# Patient Record
Sex: Male | Born: 1970
Health system: Southern US, Community
[De-identification: ages and names within clinical notes are randomized; demographics above are authoritative.]

## PROBLEM LIST (undated history)

## (undated) DIAGNOSIS — Z800 Family history of malignant neoplasm of digestive organs: Secondary | ICD-10-CM

## (undated) DIAGNOSIS — C20 Malignant neoplasm of rectum: Secondary | ICD-10-CM

## (undated) DIAGNOSIS — J449 Chronic obstructive pulmonary disease, unspecified: Secondary | ICD-10-CM

## (undated) DIAGNOSIS — Z9189 Other specified personal risk factors, not elsewhere classified: Secondary | ICD-10-CM

## (undated) DIAGNOSIS — K624 Stenosis of anus and rectum: Secondary | ICD-10-CM

## (undated) DIAGNOSIS — E119 Type 2 diabetes mellitus without complications: Secondary | ICD-10-CM

## (undated) DIAGNOSIS — A0472 Enterocolitis due to Clostridium difficile, not specified as recurrent: Secondary | ICD-10-CM

## (undated) DIAGNOSIS — Z1509 Genetic susceptibility to other malignant neoplasm: Secondary | ICD-10-CM

## (undated) DIAGNOSIS — R0600 Dyspnea, unspecified: Secondary | ICD-10-CM

## (undated) DIAGNOSIS — Z932 Ileostomy status: Secondary | ICD-10-CM

## (undated) DIAGNOSIS — F419 Anxiety disorder, unspecified: Secondary | ICD-10-CM

## (undated) DIAGNOSIS — F329 Major depressive disorder, single episode, unspecified: Secondary | ICD-10-CM

## (undated) DIAGNOSIS — C189 Malignant neoplasm of colon, unspecified: Secondary | ICD-10-CM

## (undated) DIAGNOSIS — F32A Depression, unspecified: Secondary | ICD-10-CM

## (undated) HISTORY — DX: Chronic obstructive pulmonary disease, unspecified: J44.9

## (undated) HISTORY — PX: TOE AMPUTATION: SHX809

## (undated) HISTORY — PX: COLON SURGERY: SHX602

## (undated) HISTORY — DX: Family history of malignant neoplasm of digestive organs: Z80.0

---

## 2002-06-28 ENCOUNTER — Encounter: Payer: Self-pay | Admitting: Emergency Medicine

## 2002-06-28 ENCOUNTER — Emergency Department (HOSPITAL_COMMUNITY): Admission: EM | Admit: 2002-06-28 | Discharge: 2002-06-28 | Payer: Self-pay | Admitting: Emergency Medicine

## 2006-09-30 ENCOUNTER — Emergency Department (HOSPITAL_COMMUNITY): Admission: EM | Admit: 2006-09-30 | Discharge: 2006-09-30 | Payer: Self-pay | Admitting: Emergency Medicine

## 2007-02-15 ENCOUNTER — Inpatient Hospital Stay (HOSPITAL_COMMUNITY): Admission: EM | Admit: 2007-02-15 | Discharge: 2007-02-19 | Payer: Self-pay | Admitting: Emergency Medicine

## 2007-03-01 ENCOUNTER — Encounter (INDEPENDENT_AMBULATORY_CARE_PROVIDER_SITE_OTHER): Payer: Self-pay | Admitting: Specialist

## 2007-03-02 ENCOUNTER — Inpatient Hospital Stay (HOSPITAL_COMMUNITY): Admission: RE | Admit: 2007-03-02 | Discharge: 2007-03-03 | Payer: Self-pay | Admitting: Specialist

## 2007-03-08 ENCOUNTER — Encounter (HOSPITAL_BASED_OUTPATIENT_CLINIC_OR_DEPARTMENT_OTHER): Admission: RE | Admit: 2007-03-08 | Discharge: 2007-06-06 | Payer: Self-pay | Admitting: Surgery

## 2011-05-07 NOTE — Op Note (Signed)
[name], [name]                   ACCOUNT NO.:  [id number]   MEDICAL RECORD NO.:  [id number]          PATIENT TYPE:  INP   LOCATION:  1520                         FACILITY:  [facility]   PHYSICIAN:  Jene Every, M.D.    DATE OF BIRTH:  Jul 29, 1971   DATE OF PROCEDURE:  03/01/2007  DATE OF DISCHARGE:                               OPERATIVE REPORT   PREOPERATIVE DIAGNOSIS:  Nonviable necrotic left fifth toe.   POSTOPERATIVE DIAGNOSIS:  Nonviable necrotic left fifth toe.   PROCEDURE:  Ray resection of the left fifth toe, irrigation and  debridement of the soft tissue.   ANESTHESIA:  General.   ASSISTANT:  Roma Schanz, P.A.   BRIEF HISTORY:  This is a 40 year old who over a week ago had dropped a  near 3000 pound trailer onto his left foot and had a crush injury to his  foot that we irrigated, debrided and partially closed. It was felt that  at that he questionable viability of the fifth digit. We elected to  observe for the wound to demarcate, it has demarcated over the past 1/2  week to involve a necrotic fifth toe that was insensate to approximately  3 cm proximal to the web space and encompassing some portion of the  plantar aspect of the foot.  The other wounds had healed well, he was  indicated for a resection of the nonviable digit. The risks and benefits  were discussed including bleeding, infection, need for further revision,  open wound require dressing changes, amputation proximally, etc.   TECHNIQUE:  The patient in supine position and after the induction of  adequate general, 1 gram of Kefzol, the left lower extremity was prepped  and draped in the usual sterile fashion.  Just prior to that, the K-wire  was removed the previous sutures were removed.  I performed an  elliptical incision around the fifth digit and removed it at the  metatarsal-phalangeal joint.  Noted was nonviable tissue but no  purulence was encountered.  I debrided the skin edges proximally from  the web space.  I debrided to what appeared to be viable tissue.  This  however left a defect in closure.  Therefore I had to remove the  metatarsal head and neck and the distal aspect of the shaft to be able  to mobilize the soft tissue, cover the bone and fill in the majority of  the defect.  This was then copiously irrigated.  I removed the tendons,  the extensor tendon, the flexor tendon to the point of resection.  Also  the neurovascular structures were divided cleanly.  The fourth digit  appeared to be viable.  After copious irrigation and release of the  tourniquet, there was good bleeding tissue on the entire circumference  of the wound deep into the wound. I closed the wound deep with 2-0  Vicryl simple sutures and reapproximated some of the skin with 4-0  nylon, there however was an open wound measuring 4 cm x 2 cm in its  entirety we were unable to close.  This did appear to  be viable tissue  and was therefore packed with a 4x4 soaked in saline Xeroform and a  sterile dressing secured with an Ace  bandage.  Following this, there was no active bleeding.  The patient was  then extubated without difficulty and transported to the recovery room  in satisfactory condition.   The patient tolerated the procedure well and there were no  complications.      Jene Every, M.D.  Electronically Signed     JB/MEDQ  D:  03/01/2007  T:  03/03/2007  Job:  [id number]

## 2011-05-07 NOTE — H&P (Signed)
[name], [name]                   ACCOUNT NO.:  [id number]   MEDICAL RECORD NO.:  [id number]          PATIENT TYPE:  INP   LOCATION:  5031                         FACILITY:  [facility]   PHYSICIAN:  Jene Every, M.D.    DATE OF BIRTH:  06/27/1971   DATE OF ADMISSION:  02/15/2007  DATE OF DISCHARGE:                              HISTORY & PHYSICAL   CHIEF COMPLAINT:  Left foot pain.   HISTORY:  This is a 40 year old male, whom today, was working and he  apparently had had a 3000 pound trailer fallen onto his foot sustaining  an injury to the foot.  He had a lot of pain and swelling.  He was seen  by the emergency room physicians.  There was a laceration over the  dorsum of the foot noted and on the plantar aspect of the foot, a crush  injury to the lateral aspect of the foot.  He had a comminuted fracture  of the metatarsal neck and a translocation in the MCP joint.  He was  found, by exam by Dr. Ignacia [name], he had numbness into the toe.  Good  pulses.  Compartments were soft.  We evaluated the patient in the  emergency room.  He had an absence of sensation over the fifth toe, into  the dorsum of the foot, right in the area of impact.  He had an area  over the dorsal lateral aspect of the foot that was nonviable.  An  orthopedic consultation was requested.   PAST MEDICAL HISTORY:  Negative peptic ulcer disease, diabetes, coronary  artery disease, hypertension.   Tobacco positive.   ALLERGIES:  NONE.   MEDICATIONS:  None.   EXAMINATION:  GENERAL:  Healthy in moderate distress.  Mood and affect  is appropriate.  EXTREMITIES:  Inspection of the foot, skin laceration over the dorsum of  the foot between the first and second metatarsals.  It is clean, through  the skin only, approximately 7 cm in length.  He had good dorsiflexion  and plantar flexion.  There was some rotation of the fifth digit.  Decreased sensation of the fifth digit into the dorsal lateral aspect of  the foot in the  area of direct impact with a question of viability of  the skin on the dorsal lateral aspect of the foot in the area of the  impact.  Laceration between the third and fourth digits on the plantar  surface.  It was clean.  A 1+ dorsalis pedis, posterior pulse.  His  compartments were soft.  He was able to move the digits over than the  fifth digit.  The capillary refill, on the digit, was diminished and on  palpation, he had hypermobility of the whole segment.  Felt as if he was  translocating through the comminuted metatarsal neck.  There was not  tense dislocation.  He had be given antibiotics, updated on his tetanus  and the wounds were cleaned with a Betadine at my presentation.   Radiographs demonstrated comminution of that fifth metatarsal neck  translocation noted.  No other fractures  air in the soft tissues.   IMPRESSION:  Crush injury to the left foot.  Comminuted fracture to the  fifth metatarsal head and neck with translocation in the MTP joint.  Absent sensation of the fifth digit, questionable viability in the  dorsal lateral aspect of the foot in the area of impact.  Multiple  lacerations.  Air in the soft tissues.   PLAN/RECOMMENDATIONS:  I recommend proceeding to the operating room for  irrigation, debridement, evaluation and possible open reduction internal  fixation of the digit.  We discussed risks and benefits for bleeding,  infection, damage to vascular structures, no change in symptoms,  worsening of symptoms, need for amputation specifically of the fifth  toe.  I have indicated that may be nonviable, though it may take days to  clear itself given the crush injury to the foot.  We will proceed  accordingly.      Jene Every, M.D.  Electronically Signed     JB/MEDQ  D:  02/16/2007  T:  02/16/2007  Job:  [id number]

## 2011-05-07 NOTE — Discharge Summary (Signed)
[name], [name]                   ACCOUNT NO.:  [id number]   MEDICAL RECORD NO.:  [id number]          PATIENT TYPE:  INP   LOCATION:  5031                         FACILITY:  [facility]   PHYSICIAN:  Jene Every, M.D.    DATE OF BIRTH:  05/20/1971   DATE OF ADMISSION:  02/15/2007  DATE OF DISCHARGE:  02/19/2007                               DISCHARGE SUMMARY   ADMISSION DIAGNOSIS:  Crush injury of left foot with comminuted fracture  of the fifth metatarsal, absent sensation to the fifth digit.   DISCHARGE DIAGNOSES:  Includes:  1. Crush injury of left foot with comminuted fracture of the fifth      metatarsal, absent sensation to the fifth digit.  2. Status post irrigation and debridement.  3. Open reduction internal fixation of the fifth digit.   HISTORY:  [name] is a pleasant 40 year old gentleman who on February  27 had a 3000 pound trailer fall on his foot, sustained injury and noted  pain and swelling.  He was seen by the emergency room physician with  laceration over the dorsum of the foot noted and on the plantar aspect  of the foot as well.  X-rays revealed comminuted fracture of the  metatarsal neck and translocation in MCP joint.  This was found and  examined by Dr. Ignacia Palma.  He did have decreased sensation in his toe,  but good pulses.  Compartments were soft.  Dr. Shelle Iron later evaluated the  patient in the emergency room.  He had absence in sensation in his fifth  digit in the dorsum of the foot, right in the area of impact.  This area  was felt to be nonviable.  Patient was taken directly from the emergency  room to the operating room for irrigation and debridement and possible  amputation of the toe.  Risks and benefits of all this were discussed  with the patient.  He did elect to proceed.   PROCEDURE:  Patient was taken to the OR on February 15, 2007 to undergo  irrigation and debridement of the left foot.  However, repaired  laceration of the dorsum of the foot  between the first and second  metatarsals.  There was also irrigation and debridement, repair of  laceration between third and fourth digits on the plantar aspect of the  web space, excision of nonviable tissue in the dorsum of the foot, as  well as irrigation and debridement, as well as repair of fifth extensor  tendon of fifth digit as well as placement of a K-wire to the fifth  metatarsal.   SURGEON:  Dr. Shelle Iron.   ASSISTANT:  Roma Schanz, PA-C.   COMPLICATIONS:  None.   PREOPERATIVE LABS:  CBC showed a normal white cell count, hemoglobin  stable at 14.9, hematocrit 43.0.  Routine chemistries were within normal  range.  Renal function was within normal range.  No preoperative chest x-  ray or EKG was warranted.   HOSPITAL COURSE:  Again, the patient was taken directly from the  emergency room to the operating room to undergo the above-stated  procedure.  He underwent this without significant difficulty.  He was  then transferred to the PACU and then to the orthopedic floor for  continued postoperative care.  Postoperatively, the patient did fairly  well.  Pain was well controlled.  Vital signs remained stable.  We did  consult wound care for daily dressing changes.  There was great concern  that he continued to have decreased sensation along the fifth digit, as  well as the significant discoloration.  Dr. Shelle Iron did discuss that he  may likely need amputation of his fifth toe.  IV antibiotics were  continued.  The toe was monitored over the next several days.  Overall,  the patient continued to do fairly well and no evidence of infection was  noted.  However, there was no change in the status of his fifth toe.  It  was felt that on postoperative day number 4, the patient was stable to  be discharged home.  Follow him on an outpatient basis.  It was  discussed, at length, with him that he will likely need a fifth toe  amputation, but again would monitor this closely over the  next several  days.   DISPOSITION:  The patient discharged home with home health daily wound  care.  He is to follow up with Dr. Shelle Iron in 1-2 days.  He is to call the  office for an appointment.   DISCHARGE MEDICATIONS:  Include:  1. Keflex 500 mg 1 p.o. q.i.d.  2. Vitamin C.  3. Vicodin 1-2 p.o. q.4-6 p.r.n. pain.   DIET:  As tolerated.   ACTIVITY:  He is to be nonweightbearing bearing on the left lower  extremity.  He is to utilize the wooden shoe, elevation, keep his foot  clean and dry.  He was instructed not to smoke.   CONDITION ON DISCHARGE:  Stable.   FINAL DIAGNOSIS:  Status post incision and drainage, open reduction  internal fixation of left fifth metatarsal with probable nonviable fifth  digit.      Roma Schanz, P.A.      Jene Every, M.D.  Electronically Signed    CS/MEDQ  D:  03/30/2007  T:  03/30/2007  Job:  [id number]

## 2011-05-07 NOTE — Assessment & Plan Note (Signed)
[facility]   NAME:  [name], [name]                   ACCOUNT NO.:  [id number]   MEDICAL RECORD NO.:  [id number]            DATE OF BIRTH:   PHYSICIAN:  Theresia Majors. Tanda Rockers, M.D. VISIT DATE:  05/18/2007                                   OFFICE VISIT   SUBJECTIVE:  [name] is a 40 year old man who we initially saw in March  of 2008 with a traumatic injury to his left lateral foot.  The patient  has missed several interim appointments.  He returns for followup.  He  reports that he has had no excessive drainage, malodor, pain or fever.  He is complaining of a hardened skin around the wound.   OBJECTIVE:  Blood pressure is 95/63, respirations 18, pulse rate 73,  temperature 97.1.  Inspection of the left foot shows that there is a  callus associated with the wound itself.  There has been traumatic  decrease in size.  There is healthy granulation with an area of necrosis  directly adjacent to the callus.  A 10 blade was used to perform a full  thickness debridement of both callus and nonviable tissue.  The wound  appears to be healthy.  There was healthy bleeding, which was controlled  with direct pressure.  There is no exposed bone.  There is no associated  edema, cellulitis or lymphangitis.  The pedal pulse remains palpable.  The patient's sensation is intact.   ASSESSMENT:  Clinical improvement of posttraumatic wound.   PLAN:  We have instructed the patient to apply topical triantibiotic  daily, continue antiseptic soap washes and wear a white sock.  We will  reevaluate him in 1 month.  The patient will likely need a custom insert  to offload and prevent pressure with secondary callus formation.   We have given the patient an opportunity to asks questions.  He seems to  understand the foregoing discussion and indicates that he will be  compliant.      Harold A. Tanda Rockers, M.D.  Electronically Signed     HAN/MEDQ  D:  05/18/2007  T:  05/18/2007  Job:  [id number]

## 2011-05-07 NOTE — Consult Note (Signed)
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   RECOMMENDATIONS:  Continued open management with moist-moist dressings  twice daily with liberalization of bathing to include twice daily  showers with a dry dressing.  We will re-evaluate the patient weekly to  assess the need for additional debridement and to assess the development  and/or absence of infection.   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  He was treated as an  outpatient by Dr. Shelle Iron with serial exams, awaiting demarcation of a  relatively severe crush injury.  He was taken to surgery on March 12 and  underwent a debridement of nonviable tissue which included a ray  resection of the left fifth toe.  Since his operation, the patient has  been on Keflex p.o. 500 mg q.i.d. and simple analgesia.  He has been  offloading by way of crutches and a healing sandal.  He denies interim  fever.  His pain is well-controlled.  His past medical history is  remarkable for him being a healthy male.  He does smoke a pack of  cigarettes a day.  He denies previous surgery.  His family history is  positive for diabetes; negative for hypertension, cancer or stroke.  Socially, he is divorced.  He has children.  He is originally from  [place].   The review of systems  discloses that he is healthy.  He specifically  denies bowel or bladder dysfunction, chest pain, hemoptysis, recent  weight loss or gain, polydipsia or polyphagia.   GENERAL:  On physical exam, he is an alert, oriented man in no acute  distress in good contact with reality.  VITAL SIGNS:  Blood pressure is 124/70, respirations 16, pulse rate 68,  temperature 97.8.  HEENT:  Exam is clear.  NECK:  Supple.  Trachea is midline.  Thyroid is nonpalpable.  LUNGS:  Clear.  HEART:  Sounds are normal.  ABDOMEN:  Soft.  LOWER EXTREMITIES:  Examination of the lower extremity shows they are  normal.  He has a normal right foot.  The left foot has postsurgical  changes consistent with the aforementioned surgery.  There is a clearly  granulating bed with no evidence of infection.  There is a small area of  full-thickness eschar which is well-adherent with no exudative drainage.  This wound was photographed, measured and entered into the [facility]  computer program.  Neurologically, the protective sensation is  preserved.  The dorsalis pedis is 3+, readily palpable.  There is  absolutely no evidence of an ischemic compromise.  There is no regional  adenopathy.   DISCUSSION:  This patient has had a crush injury to the left lateral  foot.  He has undergone a period of observation for demarcation and has  subsequently had resection/debridement of nonviable tissue.  We will  treat him openly with precautions to avoid infection.  He is adequately  off-loaded.  His wound appears to be healing satisfactorily at this  point.  We will follow him weekly with objectives to make a transition  to a custom insert in the future.   We have explained this approach to the patient in terms that he seems to  understand.  We have given him an opportunity to ask questions.  He  expresses gratitude for having been seen in the clinic and indicates  that he will be compliant as per above.      [name],  M.D.  Electronically Signed     HAN/MEDQ  D:  03/09/2007  T:  03/09/2007  Job:  [id number]   cc:   Jene Every, M.D.

## 2015-04-27 ENCOUNTER — Emergency Department (HOSPITAL_COMMUNITY)
Admission: EM | Admit: 2015-04-27 | Discharge: 2015-04-27 | Disposition: A | Discharge status: Home / Self Care | Payer: Self-pay | Attending: Emergency Medicine | Admitting: Emergency Medicine

## 2015-04-27 ENCOUNTER — Encounter (HOSPITAL_COMMUNITY): Payer: Self-pay | Admitting: Emergency Medicine

## 2015-04-27 DIAGNOSIS — K088 Other specified disorders of teeth and supporting structures: Secondary | ICD-10-CM | POA: Insufficient documentation

## 2015-04-27 DIAGNOSIS — L03213 Periorbital cellulitis: Secondary | ICD-10-CM

## 2015-04-27 DIAGNOSIS — K0889 Other specified disorders of teeth and supporting structures: Secondary | ICD-10-CM

## 2015-04-27 DIAGNOSIS — H00036 Abscess of eyelid left eye, unspecified eyelid: Secondary | ICD-10-CM | POA: Insufficient documentation

## 2015-04-27 DIAGNOSIS — Z720 Tobacco use: Secondary | ICD-10-CM | POA: Insufficient documentation

## 2015-04-27 HISTORY — DX: Other specified personal risk factors, not elsewhere classified: Z91.89

## 2015-04-27 MED ORDER — CLINDAMYCIN HCL 150 MG PO CAPS: 300.0000 mg | ORAL_CAPSULE | Freq: Three times a day (TID) | ORAL | Status: DC

## 2015-04-27 MED ORDER — HYDROCODONE-ACETAMINOPHEN 5-325 MG PO TABS: 1.0000 | ORAL_TABLET | Freq: Four times a day (QID) | ORAL | Status: DC | PRN

## 2015-04-27 MED ORDER — HYDROCODONE-ACETAMINOPHEN 5-325 MG PO TABS
2.0000 | ORAL_TABLET | Freq: Once | ORAL | Status: AC
  Administered 2015-04-27: 2 via ORAL
  Filled 2015-04-27: qty 2

## 2015-04-27 NOTE — ED Provider Notes (Signed)
CSN: [id number]     Arrival date & time 04/27/15  1630 History  This chart was scribed for non-physician practitioner Lorre Munroe, PA, working with Malvin Johns, MD, by Eustaquio Maize, ED Scribe. This patient was seen in room TR04C/TR04C and the patient's care was started at 4:59 PM.     Chief Complaint  Patient presents with  . Dental Pain  . Eye Problem   The history is provided by the patient. No language interpreter was used.     HPI Comments: [name] is a 44 y.o. male who presents to the Emergency Department complaining of left upper tooth pain that began approximately 2 days ago. Pt reports that he woke up this morning with left eye swelling, causing concern. Pt has taken ibuprofen without relief. He has taken approximately 2-3 unprescribed antibiotics as well that he reports were his sister in laws. Pt denies fever, chills, visual changes, or any other symptoms.    History reviewed. No pertinent past medical history. History reviewed. No pertinent past surgical history. No family history on file. History  Substance Use Topics  . Smoking status: Current Every Day Smoker  . Smokeless tobacco: Not on file  . Alcohol Use: No    Review of Systems  Constitutional: Negative for fever and chills.  HENT: Positive for dental problem. Negative for trouble swallowing.   Eyes: Negative for visual disturbance.       Swelling to left eye  Respiratory: Negative for shortness of breath.   Gastrointestinal: Negative for nausea, vomiting and abdominal pain.  Musculoskeletal: Negative for myalgias.  Skin: Negative for color change and rash.  Neurological: Negative for dizziness, light-headedness and headaches.      Allergies  Review of patient's allergies indicates no known allergies.  Home Medications   Prior to Admission medications   Not on File   Triage Vitals: BP 120/80 mmHg  Pulse 103  Temp(Src) 98.1 F (36.7 C) (Oral)  Resp 18  Ht 6' (1.829 m)  Wt 173 lb 4.8 oz  (78.608 kg)  BMI 23.50 kg/m2  SpO2 98%   Physical Exam  Constitutional: He is oriented to person, place, and time. He appears well-developed and well-nourished. No distress.  HENT:  Head: Normocephalic and atraumatic.  Poor dentition throughout.  Affected tooth as diagrammed.  No signs of peritonsillar or tonsillar abscess.  No signs of gingival abscess. Oropharynx is clear and without exudates.  Uvula is midline.  Airway is intact. No signs of Ludwig's angina with palpation of oral and sublingual mucosa.   Eyes: Conjunctivae and EOM are normal.  Very mild signs of left-sided preseptal cellulitis, no obvious abscess, no discharge, no drainage, no entrapment, no vision changes  Neck: Neck supple. No tracheal deviation present.  Cardiovascular: Normal rate.   Pulmonary/Chest: Effort normal. No respiratory distress.  Musculoskeletal: Normal range of motion.  Neurological: He is alert and oriented to person, place, and time.  Skin: Skin is warm and dry.  Psychiatric: He has a normal mood and affect. His behavior is normal.  Nursing note and vitals reviewed.   ED Course  Procedures (including critical care time)  DIAGNOSTIC STUDIES: Oxygen Saturation is 98% on RA, normal by my interpretation.    COORDINATION OF CARE: 5:02 PM-Discussed treatment plan which includes antibiotic prescription with pt at bedside and pt agreed to plan.   Labs Review Labs Reviewed - No data to display  Imaging Review No results found.   EKG Interpretation None      MDM  Final diagnoses:  Preseptal cellulitis of left eye  Pain, dental   patient with preseptal cellulitis of left eye and dental pain. Patient has horrible dentition throughout, and will need to see a dentist. I will start him on clindamycin. Patient discussed with Dr. Tamera Punt, who recommends recheck tomorrow to ensure that the preseptal cellulitis is not worsening. There is no evidence of abscess at this time. Patient is well-appearing.  He is not in any apparent distress. Vital signs are stable.  I personally performed the services described in this documentation, which was scribed in my presence. The recorded information has been reviewed and is accurate.    Montine Circle, PA-C 04/27/15 [place], MD 04/27/15 [phone]

## 2015-04-27 NOTE — Discharge Instructions (Signed)
Dental Pain A tooth ache may be caused by cavities (tooth decay). Cavities expose the nerve of the tooth to air and hot or cold temperatures. It may come from an infection or abscess (also called a boil or furuncle) around your tooth. It is also often caused by dental caries (tooth decay). This causes the pain you are having. DIAGNOSIS  Your caregiver can diagnose this problem by exam. TREATMENT   If caused by an infection, it may be treated with medications which kill germs (antibiotics) and pain medications as prescribed by your caregiver. Take medications as directed.  Only take over-the-counter or prescription medicines for pain, discomfort, or fever as directed by your caregiver.  Whether the tooth ache today is caused by infection or dental disease, you should see your dentist as soon as possible for further care. SEEK MEDICAL CARE IF: The exam and treatment you received today has been provided on an emergency basis only. This is not a substitute for complete medical or dental care. If your problem worsens or new problems (symptoms) appear, and you are unable to meet with your dentist, call or return to this location. SEEK IMMEDIATE MEDICAL CARE IF:   You have a fever.  You develop redness and swelling of your face, jaw, or neck.  You are unable to open your mouth.  You have severe pain uncontrolled by pain medicine. MAKE SURE YOU:   Understand these instructions.  Will watch your condition.  Will get help right away if you are not doing well or get worse. Document Released: 12/06/2005 Document Revised: 02/28/2012 Document Reviewed: 07/24/2008 [facility] Patient Information 2015 [place], [place]. This information is not intended to replace advice given to you by your health care provider. Make sure you discuss any questions you have with your health care provider. Orbital Cellulitis  Orbital cellulitis is an infection of the soft tissue of the orbit without abscess formation. The  eye socket is the area around and behind the eye. It usually comes on suddenly in children, but can happen at any age. This condition can lead to death if untreated.  CAUSES   A germ (bacterial) infection of the area around and behind the eye.  Long-term (chronic) sinus infections.  An object (foreign body) stuck behind the eye.  An injury that goes through (penetrates) to tissues of the eyelids.  Trauma with secondary infection.  Fracture of the boney wall or floor of the orbit.  Serious eyelid infections.  Bite wounds.  Inflammation or infection of the lining membranes of the brain (meningitis).  Blood poisoning or infection (septicemia).  Dental area filled with pus (abscesses).  Severe uncontrolled diabetes (ketoacidosis). SYMPTOMS   Pain in the eye.  Redness and puffiness (swelling) of the eyelids. The swelling is often bad enough that the eye has to close.  Fever and feeling generally ill.  The lids and the cheek may be very red, hot and swollen.  A drop in vision.  Pain when touching the area around the eye.  The eye itself may look like it is "popping out" (proptosis).  Double vision - seeing two of everything (diplopia). DIAGNOSIS  An ophthalmologist can tell you if you have orbital cellulitis during an eye exam. It is important to know if the infection goes into the area behind the eye. True orbital cellulitis is a medical emergency. A CT scan may be needed to see if the sinuses are involved. A CT scan can also see if an abscess has formed behind the eye.  TREATMENT  Orbital cellulitis should be treated in the hospital with medicine that kills germs (antibiotics). These antibiotics are given right into the bloodstream through a vein (intravenous). SEEK IMMEDIATE MEDICAL CARE IF:   You have red, swollen eyelids.  You have a fever.  You develop double vision. MAKE SURE YOU:   Understand these instructions.  Will watch your condition.  Will get help  right away if you are not doing well or get worse. Document Released: 11/30/2001 Document Revised: 02/28/2012 Document Reviewed: 04/01/2008 [facility] Patient Information 2015 [place], [place]. This information is not intended to replace advice given to you by your health care provider. Make sure you discuss any questions you have with your health care provider.

## 2015-04-27 NOTE — ED Notes (Signed)
Pt c/o left upper toothache and swelling to left eye x couple days. Pt has tried ibuprofen and antibiotics without relief.

## 2015-04-28 ENCOUNTER — Emergency Department (HOSPITAL_COMMUNITY)
Admission: EM | Admit: 2015-04-28 | Discharge: 2015-04-28 | Disposition: A | Discharge status: Home / Self Care | Payer: Self-pay | Attending: Emergency Medicine | Admitting: Emergency Medicine

## 2015-04-28 ENCOUNTER — Encounter (HOSPITAL_COMMUNITY): Payer: Self-pay | Admitting: Cardiology

## 2015-04-28 DIAGNOSIS — Z8719 Personal history of other diseases of the digestive system: Secondary | ICD-10-CM | POA: Insufficient documentation

## 2015-04-28 DIAGNOSIS — Z720 Tobacco use: Secondary | ICD-10-CM | POA: Insufficient documentation

## 2015-04-28 DIAGNOSIS — H00036 Abscess of eyelid left eye, unspecified eyelid: Secondary | ICD-10-CM

## 2015-04-28 DIAGNOSIS — H05012 Cellulitis of left orbit: Secondary | ICD-10-CM | POA: Insufficient documentation

## 2015-04-28 NOTE — ED Provider Notes (Signed)
CSN: [id number]     Arrival date & time 04/28/15  1733 History  This chart was scribed for non-physician practitioner, Etta Quill, NP, working with Fredia Sorrow, MD, by Jeanell Sparrow, ED Scribe. This patient was seen in room TR08C/TR08C and the patient's care was started at 6:29 PM.   Chief Complaint  Patient presents with  . Follow-up   The history is provided by the patient. No language interpreter was used.   HPI Comments: [name] is a 44 y.o. male who presents to the Emergency Department complaining of a facial swelling that started 3 days ago. He states that he was here yesterday and placed on clindamycin for a skin infection  under the left eye from a dental problem. He reports that he noticed some new minimal redness under his left eye, so he came to the ED for a follow-up visit. He denies any worsening swelling or fever.   Past Medical History  Diagnosis Date  . Poor dental hygiene    History reviewed. No pertinent past surgical history. History reviewed. No pertinent family history. History  Substance Use Topics  . Smoking status: Current Every Day Smoker  . Smokeless tobacco: Not on file  . Alcohol Use: No    Review of Systems  Constitutional: Negative for fever.  HENT: Positive for facial swelling.   All other systems reviewed and are negative.  Allergies  Review of patient's allergies indicates no known allergies.  Home Medications   Prior to Admission medications   Medication Sig Start Date End Date Taking? Authorizing Provider  clindamycin (CLEOCIN) 150 MG capsule Take 2 capsules (300 mg total) by mouth 3 (three) times daily. May dispense as 150mg  capsules 04/27/15   Montine Circle, PA-C  HYDROcodone-acetaminophen (NORCO/VICODIN) 5-325 MG per tablet Take 1-2 tablets by mouth every 6 (six) hours as needed. 04/27/15   Montine Circle, PA-C   BP 127/89 mmHg  Pulse 83  Temp(Src) 97.9 F (36.6 C) (Oral)  Resp 18  Wt 173 lb (78.472 kg)  SpO2 97% Physical Exam   Constitutional: He appears well-developed and well-nourished. No distress.  HENT:  Head: Normocephalic and atraumatic.  (See photo)  Neck: Neck supple. No tracheal deviation present.  Cardiovascular: Normal rate.   Pulmonary/Chest: Effort normal. No respiratory distress.  Musculoskeletal: Normal range of motion.  Neurological: He is alert.  Skin: Skin is warm and dry.  Psychiatric: He has a normal mood and affect. His behavior is normal.  Nursing note and vitals reviewed.   ED Course  Procedures (including critical care time) DIAGNOSTIC STUDIES: Oxygen Saturation is 97% on RA, normal by my interpretation.    COORDINATION OF CARE: 6:33 PM- Pt advised of plan for treatment and pt agrees.  Labs Review Labs Reviewed - No data to display  Imaging Review No results found.   EKG Interpretation None          MDM   Final diagnoses:  None  Patient seen yesterday for preseptal cellulitis secondary to decayed tooth.  Started on clindamycin and asked to return today for recheck. No increase in swelling, pain, or fever.  Is taking antibiotic as directed. Has an appointment with the dentist on Wednesday. Return precautions discussed.  Cellulitis recheck.  I personally performed the services described in this documentation, which was scribed in my presence. The recorded information has been reviewed and is accurate.     Etta Quill, NP 04/28/15 2671  Fredia Sorrow, MD 05/01/15 [phone]

## 2015-04-28 NOTE — Discharge Instructions (Signed)
Cellulitis Cellulitis is an infection of the skin and the tissue under the skin. The infected area is usually red and tender. This happens most often in the arms and lower legs. HOME CARE   Take your antibiotic medicine as told. Finish the medicine even if you start to feel better.  Put a warm cloth on the area up to 4 times per day.  Only take medicines as told by your doctor.  Keep all doctor visits as told. GET HELP IF:  You see red streaks on the skin coming from the infected area.  Your red area gets bigger or turns a dark color.  Your bone or joint under the infected area is painful after the skin heals.  Your infection comes back in the same area or different area.  You have a puffy (swollen) bump in the infected area.  You have new symptoms.  You have a fever. GET HELP RIGHT AWAY IF:   You feel very sleepy.  You throw up (vomit) or have watery poop (diarrhea).  You feel sick and have muscle aches and pains. MAKE SURE YOU:   Understand these instructions.  Will watch your condition.  Will get help right away if you are not doing well or get worse. Document Released: 05/24/2008 Document Revised: 04/22/2014 Document Reviewed: 02/21/2012 [facility] Patient Information 2015 [place], [place]. This information is not intended to replace advice given to you by your health care provider. Make sure you discuss any questions you have with your health care provider.

## 2015-04-28 NOTE — ED Notes (Signed)
Pt reports he was here yesterday and placed on antibiotics for an skin infection under the left eye. State he is following up today.

## 2016-07-26 ENCOUNTER — Emergency Department (HOSPITAL_COMMUNITY)
Admission: EM | Admit: 2016-07-26 | Discharge: 2016-07-26 | Disposition: A | Discharge status: Home / Self Care | Payer: Medicaid Other | Attending: Emergency Medicine | Admitting: Emergency Medicine

## 2016-07-26 ENCOUNTER — Encounter (HOSPITAL_COMMUNITY): Payer: Self-pay | Admitting: Emergency Medicine

## 2016-07-26 ENCOUNTER — Emergency Department (HOSPITAL_COMMUNITY): Payer: Medicaid Other

## 2016-07-26 DIAGNOSIS — K625 Hemorrhage of anus and rectum: Secondary | ICD-10-CM | POA: Diagnosis present

## 2016-07-26 DIAGNOSIS — K5900 Constipation, unspecified: Secondary | ICD-10-CM | POA: Diagnosis not present

## 2016-07-26 DIAGNOSIS — F17200 Nicotine dependence, unspecified, uncomplicated: Secondary | ICD-10-CM | POA: Diagnosis not present

## 2016-07-26 DIAGNOSIS — K6289 Other specified diseases of anus and rectum: Secondary | ICD-10-CM

## 2016-07-26 LAB — CBC
HCT: 48.4 % (ref 39.0–52.0)
HEMOGLOBIN: 15.9 g/dL (ref 13.0–17.0)
MCH: 29 pg (ref 26.0–34.0)
MCHC: 32.9 g/dL (ref 30.0–36.0)
MCV: 88.3 fL (ref 78.0–100.0)
Platelets: 257 10*3/uL (ref 150–400)
RBC: 5.48 MIL/uL (ref 4.22–5.81)
RDW: 14.5 % (ref 11.5–15.5)
WBC: 7.8 10*3/uL (ref 4.0–10.5)

## 2016-07-26 LAB — URINALYSIS, ROUTINE W REFLEX MICROSCOPIC
Bilirubin Urine: NEGATIVE
Glucose, UA: NEGATIVE mg/dL
HGB URINE DIPSTICK: NEGATIVE
Ketones, ur: NEGATIVE mg/dL
LEUKOCYTES UA: NEGATIVE
Nitrite: NEGATIVE
PROTEIN: NEGATIVE mg/dL
SPECIFIC GRAVITY, URINE: 1.014 (ref 1.005–1.030)
pH: 7 (ref 5.0–8.0)

## 2016-07-26 LAB — COMPREHENSIVE METABOLIC PANEL
ALK PHOS: 55 U/L (ref 38–126)
ALT: 12 U/L — ABNORMAL LOW (ref 17–63)
ANION GAP: 4 — AB (ref 5–15)
AST: 16 U/L (ref 15–41)
Albumin: 2.4 g/dL — ABNORMAL LOW (ref 3.5–5.0)
BUN: 7 mg/dL (ref 6–20)
CALCIUM: 8.1 mg/dL — AB (ref 8.9–10.3)
CHLORIDE: 108 mmol/L (ref 101–111)
CO2: 26 mmol/L (ref 22–32)
Creatinine, Ser: 1.02 mg/dL (ref 0.61–1.24)
Glucose, Bld: 79 mg/dL (ref 65–99)
Potassium: 4 mmol/L (ref 3.5–5.1)
SODIUM: 138 mmol/L (ref 135–145)
Total Bilirubin: 0.5 mg/dL (ref 0.3–1.2)
Total Protein: 4.4 g/dL — ABNORMAL LOW (ref 6.5–8.1)

## 2016-07-26 LAB — LIPASE, BLOOD: LIPASE: 22 U/L (ref 11–51)

## 2016-07-26 MED ORDER — HYDROCODONE-ACETAMINOPHEN 5-325 MG PO TABS: 1.0000 | ORAL_TABLET | Freq: Four times a day (QID) | ORAL | 0 refills | Status: DC | PRN

## 2016-07-26 MED ORDER — SODIUM CHLORIDE 0.9 % IV BOLUS (SEPSIS)
1000.0000 mL | Freq: Once | INTRAVENOUS | Status: AC
  Administered 2016-07-26: 1000 mL via INTRAVENOUS

## 2016-07-26 MED ORDER — IOPAMIDOL (ISOVUE-300) INJECTION 61%
INTRAVENOUS | Status: AC
  Administered 2016-07-26: 100 mL
  Filled 2016-07-26: qty 100

## 2016-07-26 MED ORDER — OXYCODONE-ACETAMINOPHEN 5-325 MG PO TABS
1.0000 | ORAL_TABLET | Freq: Once | ORAL | Status: AC
  Administered 2016-07-26: 1 via ORAL
  Filled 2016-07-26: qty 1

## 2016-07-26 MED ORDER — DOCUSATE SODIUM 100 MG PO CAPS: 100.0000 mg | ORAL_CAPSULE | Freq: Two times a day (BID) | ORAL | 0 refills | Status: DC

## 2016-07-26 NOTE — Discharge Instructions (Signed)
Return here as needed. Follow up with the doctors provided.

## 2016-07-26 NOTE — ED Notes (Signed)
Patient placed on monitor patient has no complaints at this time.

## 2016-07-26 NOTE — ED Triage Notes (Signed)
Pt c/o abdominal pain, constipation and rectal bleeding x 3 weeks. Pt reports that he has lost 10 lbs in the last month. Pt also c/o pain in the back, "Like someone hit me with a baseball bat."

## 2016-07-27 ENCOUNTER — Ambulatory Visit (INDEPENDENT_AMBULATORY_CARE_PROVIDER_SITE_OTHER): Payer: Medicaid Other | Admitting: Physician Assistant

## 2016-07-27 ENCOUNTER — Encounter: Payer: Self-pay | Admitting: Physician Assistant

## 2016-07-27 VITALS — HR 68 | Wt 154.8 lb

## 2016-07-27 DIAGNOSIS — R198 Other specified symptoms and signs involving the digestive system and abdomen: Secondary | ICD-10-CM

## 2016-07-27 DIAGNOSIS — R938 Abnormal findings on diagnostic imaging of other specified body structures: Secondary | ICD-10-CM | POA: Diagnosis not present

## 2016-07-27 DIAGNOSIS — R9389 Abnormal findings on diagnostic imaging of other specified body structures: Secondary | ICD-10-CM

## 2016-07-27 DIAGNOSIS — K625 Hemorrhage of anus and rectum: Secondary | ICD-10-CM

## 2016-07-27 DIAGNOSIS — K6289 Other specified diseases of anus and rectum: Secondary | ICD-10-CM

## 2016-07-27 NOTE — Progress Notes (Signed)
Thank you for sending this case to me. I have reviewed the entire note, and the outlined plan seems appropriate.   Agreed.  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  Further plan to follow colonoscopy tomorrow.

## 2016-07-27 NOTE — Patient Instructions (Signed)
You have been scheduled for a colonoscopy. Please follow written instructions given to you at your visit today.  We have given you a colonoscopy prep. If you use inhalers (even only as needed), please bring them with you on the day of your procedure.

## 2016-07-27 NOTE — Progress Notes (Signed)
Subjective:    Patient ID: [name], male    DOB: July 15, 1971, 45 y.o.   MRN: [id number]  HPI  [name] is a pleasant generally healthy 45 year old male new to GI today referred by [name] emergency room/Tyrone Lawyer PA-C.Marland Kitchen Patient was seen in the emergency room yesterday with complaints of some rectal and lower back pain present over the past couple of months, progressive and worse at night keeping him from sleeping recently. He has been noticing blood intermittently with his bowel movements over the past couple of months. He's also had a change in bowel habits with constipation and says he has an urge for bowel movement but often can't pass any stool. He has lost about 20 pounds over the past 2-3 months and says he has cut back on eating because he knows is going to hurt when he has a bowel movement. His back pain and rectal discomfort are both worse with defecation. Labs done in emergency room with WBC of 7.6 hemoglobin 15.9 hematocrit 48.4 liver function studies within normal limits. CT of the abdomen and pelvis with contrast shows nodular thickening and hyperenhancement of the rectal wall and mildly enlarged perirectal lymph nodes. Could be inflammatory/proctitis but appearance suspicious for rectal carcinoma. Also has diverticulosis of the left colon, there is nonspecific low level lymphadenopathy elsewhere in the abdomen and pelvis and COPD with a small 3-4 mm left lower lobe lung nodules and chronic L5 pars fractures.  Review of Systems Pertinent positive and negative review of systems were noted in the above HPI section.  All other review of systems was otherwise negative.  Outpatient Encounter Prescriptions as of 07/27/2016  Medication Sig  . docusate sodium (COLACE) 100 MG capsule Take 1 capsule (100 mg total) by mouth every 12 (twelve) hours.  Marland Kitchen HYDROcodone-acetaminophen (NORCO/VICODIN) 5-325 MG tablet Take 1 tablet by mouth every 6 (six) hours as needed for moderate pain.  Marland Kitchen ibuprofen  (ADVIL,MOTRIN) 200 MG tablet Take 400 mg by mouth every 6 (six) hours as needed for headache or moderate pain.  . [DISCONTINUED] clindamycin (CLEOCIN) 150 MG capsule Take 2 capsules (300 mg total) by mouth 3 (three) times daily. May dispense as 150mg  capsules (Patient not taking: Reported on 07/26/2016)   No facility-administered encounter medications on file as of 07/27/2016.    No Known Allergies There are no active problems to display for this patient.  Social History   Social History  . Marital status: Divorced    Spouse name: N/A  . Number of children: N/A  . Years of education: N/A   Occupational History  . Not on file.   Social History Main Topics  . Smoking status: Current Every Day Smoker  . Smokeless tobacco: Never Used  . Alcohol use No  . Drug use:     Types: Marijuana     Comment: patient denies  . Sexual activity: Not on file   Other Topics Concern  . Not on file   Social History Narrative  . No narrative on file    [name] family history is not on file.      Objective:    Vitals:   07/27/16 1429  BP: (P) 110/70  Pulse: 68    Physical Exam   well-developed male in no acute distress, accompanied by his fiance and mother-in-law. Blood pressure 110/70 pulse 68 weight 154 BMI 21.5. HEENT; nontraumatic normocephalic EOMI PERRLA sclera anicteric, Cardiovascular; regular rate and rhythm with S1-S2 no murmur or gallop, Pulmonary clear bilaterally,  Abdomen ;soft nontender nondistended bowel sounds are active there is no palpable mass or hepatosplenomegaly. Rectal; exam no external lesion, on digital exam there is very firm irregular tissue palpable which is also quite tender, mucoid heme on examining glove, Ext; no clubbing cyanosis or edema skin warm and dry, Neuropsych; mood and affect appropriate       Assessment & Plan:   #45  45 year old male with 2-3 month history of rectal and low back pain worse with defecation and associated with intermittent rectal  bleeding and weight loss. CT of the abdomen and pelvis done yesterday as outlined above consistent with rectal cancer. Exam today also consistent with a rectal tumor. CT scan shows Peri-rectal lymph nodes and nonspecific low level lymphadenopathy in the abdomen and pelvis, he also has small 3-4 mm left lower lobe lung nodules.  #2 COPD noted on CT  Plan; findings on exam and CT discussed in detail with patient and his family. Patient will be scheduled for colonoscopy with Dr. Loletha Carrow  tomorrow 07/28/2016. Procedure discussed in detail with the patient including risks and benefits and he is agreeable to proceed. Further workup pending findings at colonoscopy.  Abdullahi Vallone S Ivery Nanney PA-C 07/27/2016   Cc: No ref. provider found

## 2016-07-28 ENCOUNTER — Telehealth: Payer: Self-pay

## 2016-07-28 ENCOUNTER — Encounter: Payer: Self-pay | Admitting: Gastroenterology

## 2016-07-28 ENCOUNTER — Ambulatory Visit (AMBULATORY_SURGERY_CENTER): Payer: Self-pay | Admitting: Gastroenterology

## 2016-07-28 ENCOUNTER — Other Ambulatory Visit: Payer: Self-pay

## 2016-07-28 VITALS — BP 102/72 | HR 83 | Temp 99.3°F | Resp 17 | Wt 154.0 lb

## 2016-07-28 DIAGNOSIS — K6289 Other specified diseases of anus and rectum: Secondary | ICD-10-CM

## 2016-07-28 DIAGNOSIS — D124 Benign neoplasm of descending colon: Secondary | ICD-10-CM | POA: Diagnosis not present

## 2016-07-28 DIAGNOSIS — C20 Malignant neoplasm of rectum: Secondary | ICD-10-CM | POA: Diagnosis not present

## 2016-07-28 DIAGNOSIS — D128 Benign neoplasm of rectum: Secondary | ICD-10-CM

## 2016-07-28 DIAGNOSIS — R198 Other specified symptoms and signs involving the digestive system and abdomen: Secondary | ICD-10-CM

## 2016-07-28 MED ORDER — SODIUM CHLORIDE 0.9 % IV SOLN: 500.0000 mL | INTRAVENOUS | Status: DC

## 2016-07-28 NOTE — Telephone Encounter (Signed)
Dr Ardis Hughs, do you want me to set up lower EUS no mac for tomorrow?

## 2016-07-28 NOTE — Progress Notes (Signed)
Called to room to assist during endoscopic procedure.  Patient ID and intended procedure confirmed with present staff. Received instructions for my participation in the procedure from the performing physician.  

## 2016-07-28 NOTE — Op Note (Signed)
[facility] Patient Name: [name] Procedure Date: 07/28/2016 2:09 PM MRN: [id number] Endoscopist: Mallie Mussel L. Loletha Carrow , MD Age: 45 Referring MD:  Date of Birth: 1971/04/30 Gender: Male Account #: [id number] Procedure:                Colonoscopy Indications:              Pelvic pain, Rectal bleeding, Change in stool                            caliber Medicines:                Monitored Anesthesia Care Procedure:                Pre-Anesthesia Assessment:                           - Prior to the procedure, a History and Physical                            was performed, and patient medications and                            allergies were reviewed. The patient's tolerance of                            previous anesthesia was also reviewed. The risks                            and benefits of the procedure and the sedation                            options and risks were discussed with the patient.                            All questions were answered, and informed consent                            was obtained. Prior Anticoagulants: The patient has                            taken no previous anticoagulant or antiplatelet                            agents. ASA Grade Assessment: II - A patient with                            mild systemic disease. After reviewing the risks                            and benefits, the patient was deemed in                            satisfactory condition to undergo the procedure.  After obtaining informed consent, the colonoscope                            was passed under direct vision. Throughout the                            procedure, the patient's blood pressure, pulse, and                            oxygen saturations were monitored continuously. The                            Model CF-HQ190L [id number]) scope was introduced                            through the anus and advanced to the the cecum,               identified by appendiceal orifice and ileocecal                            valve. The colonoscopy was performed without                            difficulty. The colonoscopy was performed without                            difficulty. The patient tolerated the procedure                            well. The quality of the bowel preparation was                            excellent. The ileocecal valve, appendiceal                            orifice, and rectum were photographed. The bowel                            preparation used was SUPREP. The quality of the                            bowel preparation was evaluated using the BBPS                            [facility] Bowel Preparation Scale) with scores of:                            Right Colon = 3, Transverse Colon = 3 and Left                            Colon = 3 (entire mucosa seen well with no residual  staining, small fragments of stool or opaque                            liquid). The total BBPS score equals 9. Scope In: 2:18:51 PM Scope Out: 2:40:42 PM Scope Withdrawal Time: 0 hours 16 minutes 6 seconds  Total Procedure Duration: 0 hours 21 minutes 51 seconds  Findings:                 The digital rectal exam revealed a firm rectal                            mass. The mass was circumferential.                           Four sessile polyps were found in the rectum and                            descending colon. The polyps were 4 mm in size.                            These polyps were removed with a cold snare.                            Resection and retrieval were complete.                           Diverticula were found in the left colon.                           A fungating, partially-obstructing large mass was                            found in the distal rectum (extending to the anal                            verge). The mass was circumferential. The mass                             measured approximately eight cm in length (somewhat                            eccentric in shape). This was biopsied with a cold                            forceps for histology. The proximal aspect was                            tattooed with an injection of 3 mL of Spot (carbon                            black).                           Retroflexion in the rectum was  not performed due to                            the mass. Complications:            No immediate complications. Estimated Blood Loss:     Estimated blood loss was minimal. Impression:               - Rectal mass.                           - Four 4 mm polyps in the rectum and in the                            descending colon, removed with a cold snare.                            Resected and retrieved.                           - Diverticulosis in the left colon.                           - Likely malignant tumor in the distal rectum.                            Biopsied. Tattooed. Recommendation:           - Patient has a contact number available for                            emergencies. The signs and symptoms of potential                            delayed complications were discussed with the                            patient. Return to normal activities tomorrow.                            Written discharge instructions were provided to the                            patient.                           - Resume previous diet.                           - Perform a lower endoscopic ultrasound (LEUS)                            tomorrow.                           - Await pathology results.                           -  Repeat colonoscopy in 1 year for surveillance.                           - Continue present medications. [place] Mokry L. Loletha Carrow, MD 07/28/2016 2:50:37 PM This report has been signed electronically.

## 2016-07-28 NOTE — Telephone Encounter (Signed)
-----   Message from Alfredia Ferguson, PA-C sent at 07/28/2016  1:19 PM EDT ----- Regarding: EUS This pt is having a colonoscopy with Dr Loletha Carrow this afternoon- has rectal mass - Dr Ardis Hughs would like him to have a spot reserved for rectal EUS tomorrow even if no MAC .Marland Kitchenplease schedule- may need to go upstairs and talk  to pt/family for instructions-thank you

## 2016-07-28 NOTE — Progress Notes (Signed)
Patient awakening,vss,report to rn 

## 2016-07-28 NOTE — Telephone Encounter (Signed)
Yes, thanks

## 2016-07-28 NOTE — Patient Instructions (Signed)

## 2016-07-28 NOTE — Progress Notes (Signed)
Med tech from 3rd floor up to go over prep and instructions for Procedure at [facility].

## 2016-07-28 NOTE — Telephone Encounter (Signed)
Error

## 2016-07-28 NOTE — Telephone Encounter (Signed)
The pt has been set up for Lower EUS for 07/29/16 230 pm needs instructions

## 2016-07-28 NOTE — Telephone Encounter (Signed)
The pt has been instructed by Magdalene River CMA, instructions printed and given to pt.

## 2016-07-29 ENCOUNTER — Telehealth: Payer: Self-pay

## 2016-07-29 ENCOUNTER — Encounter (HOSPITAL_COMMUNITY): Payer: Self-pay

## 2016-07-29 ENCOUNTER — Encounter (HOSPITAL_COMMUNITY)
Admission: RE | Disposition: A | Discharge status: Home / Self Care | Payer: Self-pay | Source: Ambulatory Visit | Attending: Gastroenterology

## 2016-07-29 ENCOUNTER — Ambulatory Visit (HOSPITAL_COMMUNITY)
Admission: RE | Admit: 2016-07-29 | Discharge: 2016-07-29 | Disposition: A | Discharge status: Home / Self Care | Payer: Medicaid Other | Source: Ambulatory Visit | Attending: Gastroenterology | Admitting: Gastroenterology

## 2016-07-29 DIAGNOSIS — R634 Abnormal weight loss: Secondary | ICD-10-CM | POA: Insufficient documentation

## 2016-07-29 DIAGNOSIS — M545 Low back pain: Secondary | ICD-10-CM | POA: Insufficient documentation

## 2016-07-29 DIAGNOSIS — J449 Chronic obstructive pulmonary disease, unspecified: Secondary | ICD-10-CM | POA: Insufficient documentation

## 2016-07-29 DIAGNOSIS — K6289 Other specified diseases of anus and rectum: Secondary | ICD-10-CM | POA: Diagnosis present

## 2016-07-29 DIAGNOSIS — C218 Malignant neoplasm of overlapping sites of rectum, anus and anal canal: Secondary | ICD-10-CM | POA: Diagnosis not present

## 2016-07-29 DIAGNOSIS — Z6821 Body mass index (BMI) 21.0-21.9, adult: Secondary | ICD-10-CM | POA: Insufficient documentation

## 2016-07-29 DIAGNOSIS — F17200 Nicotine dependence, unspecified, uncomplicated: Secondary | ICD-10-CM | POA: Diagnosis not present

## 2016-07-29 HISTORY — PX: EUS: SHX5427

## 2016-07-29 SURGERY — ULTRASOUND, LOWER GI TRACT, ENDOSCOPIC
Anesthesia: Moderate Sedation

## 2016-07-29 MED ORDER — FENTANYL CITRATE (PF) 100 MCG/2ML IJ SOLN
INTRAMUSCULAR | Status: DC | PRN
  Administered 2016-07-29 (×3): 25 ug via INTRAVENOUS

## 2016-07-29 MED ORDER — SODIUM CHLORIDE 0.9 % IV SOLN: INTRAVENOUS | Status: DC

## 2016-07-29 MED ORDER — FENTANYL CITRATE (PF) 100 MCG/2ML IJ SOLN
INTRAMUSCULAR | Status: AC
  Filled 2016-07-29: qty 2

## 2016-07-29 MED ORDER — MIDAZOLAM HCL 10 MG/2ML IJ SOLN
INTRAMUSCULAR | Status: DC | PRN
  Administered 2016-07-29: 1 mg via INTRAVENOUS
  Administered 2016-07-29 (×2): 2 mg via INTRAVENOUS

## 2016-07-29 MED ORDER — MIDAZOLAM HCL 5 MG/ML IJ SOLN
INTRAMUSCULAR | Status: AC
  Filled 2016-07-29: qty 2

## 2016-07-29 NOTE — Op Note (Signed)
[facility] Patient Name: [name] Procedure Date: 07/29/2016 MRN: [id number] Attending MD: Milus Banister , MD Date of Birth: 09/16/1971 CSN: [id number] Age: 45 Admit Type: Inpatient Procedure:                Lower EUS Indications:              Pre-treatment staging for anorectal carcinoma,                            diagnosed yesterday by Dr. Loletha Carrow, pathology pending. Providers:                Milus Banister, MD, Cleda Daub, RN, William Dalton, Technician Referring MD:              Medicines:                Fentanyl 75 micrograms IV, Midazolam 5 mg IV Complications:            No immediate complications. Estimated blood loss:                            None. Estimated Blood Loss:     Estimated blood loss: none. Procedure:                Pre-Anesthesia Assessment:                           - Prior to the procedure, a History and Physical                            was performed, and patient medications and                            allergies were reviewed. The patient's tolerance of                            previous anesthesia was also reviewed. The risks                            and benefits of the procedure and the sedation                            options and risks were discussed with the patient.                            All questions were answered, and informed consent                            was obtained. Prior Anticoagulants: The patient has                            taken no previous anticoagulant or antiplatelet  agents. ASA Grade Assessment: II - A patient with                            mild systemic disease. After reviewing the risks                            and benefits, the patient was deemed in                            satisfactory condition to undergo the procedure.                           After obtaining informed consent, the endoscope was   passed under direct vision. Throughout the                            procedure, the patient's blood pressure, pulse, and                            oxygen saturations were monitored continuously. The                            [id number]) scope was introduced through                            the anus and advanced to the the sigmoid colon for                            ultrasound. The lower EUS was accomplished without                            difficulty. The patient tolerated the procedure                            well. The quality of the bowel preparation was                            adequate to identify polyps. Findings:      Endoscopic findings:      1. Nearly circumferential, clearly malignant mass (90% circumference) in       the distal rectum. The mass is 6cm long, the distal edge lays 1cm from       the anal verge.      Endosonographic Finding :      1. The mass above correlated with a hypoechoic, heterogeneous lesion       that clearly passed into and through the muscularis propria layer of the       rectal wall (uT3 if this is adenocarcinoma).      2. There were 6 round, suspicious perirectal lymphnodes (ranging in size       from 59mm to 53mm) Samuel Germany if this is adenocarcinoma) Impression:               -Clearly malignant 6cm long, nearly circumferential  mass in the distal rectum with distal edge located                            1cm from the anal verge. 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 Moderate Sedation:      Moderate (conscious) sedation was administered by the endoscopy nurse       and supervised by the endoscopist. The following parameters were       monitored: oxygen saturation, heart rate, blood pressure, and response       to care. Total physician intraservice time was 15 minutes. Recommendation:           - Discharge patient to home (ambulatory).                           -  Await pathology results. Pending final pathology                            results he will likely need medical, surgical,                            radiation oncology referrals. He will also need                            chest CT to complete the staging workup. Procedure Code(s):        --- Professional ---                           [phone], Sigmoidoscopy, flexible; with endoscopic                            ultrasound examination Diagnosis Code(s):        --- Professional ---                           K62.89, Other specified diseases of anus and rectum                           C21.8, Malignant neoplasm of overlapping sites of                            rectum, anus and anal canal CPT copyright 2016 American Medical Association. All rights reserved. The codes documented in this report are preliminary and upon coder review may  be revised to meet current compliance requirements. Milus Banister, MD 07/29/2016 3:40:33 PM This report has been signed electronically. Number of Addenda: 0

## 2016-07-29 NOTE — H&P (View-Only) (Signed)
Subjective:    Patient ID: [name], male    DOB: [birth year], 45 y.o.   MRN: [id number]  HPI  Abdulmalik is a pleasant generally healthy 45 year old male new to GI today referred by [facility]/Chris Lawyer PA-C.Marland Kitchen Patient was seen in the emergency room yesterday with complaints of some rectal and lower back pain present over the past couple of months, progressive and worse at night keeping him from sleeping recently. He has been noticing blood intermittently with his bowel movements over the past couple of months. He's also had a change in bowel habits with constipation and says he has an urge for bowel movement but often can't pass any stool. He has lost about 20 pounds over the past 2-3 months and says he has cut back on eating because he knows is going to hurt when he has a bowel movement. His back pain and rectal discomfort are both worse with defecation. Labs done in emergency room with WBC of 7.6 hemoglobin 15.9 hematocrit 48.4 liver function studies within normal limits. CT of the abdomen and pelvis with contrast shows nodular thickening and hyperenhancement of the rectal wall and mildly enlarged perirectal lymph nodes. Could be inflammatory/proctitis but appearance suspicious for rectal carcinoma. Also has diverticulosis of the left colon, there is nonspecific low level lymphadenopathy elsewhere in the abdomen and pelvis and COPD with a small 3-4 mm left lower lobe lung nodules and chronic L5 pars fractures.  Review of Systems Pertinent positive and negative review of systems were noted in the above HPI section.  All other review of systems was otherwise negative.  Outpatient Encounter Prescriptions as of 07/27/2016  Medication Sig  . docusate sodium (COLACE) 100 MG capsule Take 1 capsule (100 mg total) by mouth every 12 (twelve) hours.  Marland Kitchen HYDROcodone-acetaminophen (NORCO/VICODIN) 5-325 MG tablet Take 1 tablet by mouth every 6 (six) hours as needed for moderate pain.  Marland Kitchen ibuprofen  (ADVIL,MOTRIN) 200 MG tablet Take 400 mg by mouth every 6 (six) hours as needed for headache or moderate pain.  . [DISCONTINUED] clindamycin (CLEOCIN) 150 MG capsule Take 2 capsules (300 mg total) by mouth 3 (three) times daily. May dispense as 150mg  capsules (Patient not taking: Reported on 07/26/2016)   No facility-administered encounter medications on file as of 07/27/2016.    No Known Allergies There are no active problems to display for this patient.  Social History   Social History  . Marital status: Divorced    Spouse name: N/A  . Number of children: N/A  . Years of education: N/A   Occupational History  . Not on file.   Social History Main Topics  . Smoking status: Current Every Day Smoker  . Smokeless tobacco: Never Used  . Alcohol use No  . Drug use:     Types: Marijuana     Comment: patient denies  . Sexual activity: Not on file   Other Topics Concern  . Not on file   Social History Narrative  . No narrative on file    Mr. Butta family history is not on file.      Objective:    Vitals:   07/27/16 1429  BP: (P) 110/70  Pulse: 68    Physical Exam   well-developed male in no acute distress, accompanied by his fiance and mother-in-law. Blood pressure 110/70 pulse 68 weight 154 BMI 21.5. HEENT; nontraumatic normocephalic EOMI PERRLA sclera anicteric, Cardiovascular; regular rate and rhythm with S1-S2 no murmur or gallop, Pulmonary clear bilaterally,  Abdomen ;soft nontender nondistended bowel sounds are active there is no palpable mass or hepatosplenomegaly. Rectal; exam no external lesion, on digital exam there is very firm irregular tissue palpable which is also quite tender, mucoid heme on examining glove, Ext; no clubbing cyanosis or edema skin warm and dry, Neuropsych; mood and affect appropriate       Assessment & Plan:   #62  45 year old male with 2-3 month history of rectal and low back pain worse with defecation and associated with intermittent rectal  bleeding and weight loss. CT of the abdomen and pelvis done yesterday as outlined above consistent with rectal cancer. Exam today also consistent with a rectal tumor. CT scan shows Peri-rectal lymph nodes and nonspecific low level lymphadenopathy in the abdomen and pelvis, he also has small 3-4 mm left lower lobe lung nodules.  #2 COPD noted on CT  Plan; findings on exam and CT discussed in detail with patient and his family. Patient will be scheduled for colonoscopy with Dr. Loletha Carrow  tomorrow 07/28/2016. Procedure discussed in detail with the patient including risks and benefits and he is agreeable to proceed. Further workup pending findings at colonoscopy.  Aanvi Voyles S Myleah Cavendish PA-C 07/27/2016   Cc: No ref. provider found

## 2016-07-29 NOTE — Telephone Encounter (Signed)
Left a message at [phone] for the pt to call us back if any questions or concerns. maw

## 2016-07-29 NOTE — Discharge Instructions (Signed)

## 2016-07-29 NOTE — Interval H&P Note (Signed)
History and Physical Interval Note:  07/29/2016 2:44 PM  [name]  has presented today for surgery, with the diagnosis of rectal mass  The various methods of treatment have been discussed with the patient and family. After consideration of risks, benefits and other options for treatment, the patient has consented to  Procedure(s): LOWER ENDOSCOPIC ULTRASOUND (EUS) (N/A) as a surgical intervention .  The patient's history has been reviewed, patient examined, no change in status, stable for surgery.  I have reviewed the patient's chart and labs.  Questions were answered to the patient's satisfaction.     Milus Banister

## 2016-07-30 ENCOUNTER — Telehealth: Payer: Self-pay

## 2016-07-30 ENCOUNTER — Telehealth: Payer: Self-pay | Admitting: Gastroenterology

## 2016-07-30 ENCOUNTER — Ambulatory Visit: Payer: Self-pay

## 2016-07-30 ENCOUNTER — Encounter (HOSPITAL_COMMUNITY): Payer: Self-pay | Admitting: Gastroenterology

## 2016-07-30 DIAGNOSIS — K6289 Other specified diseases of anus and rectum: Secondary | ICD-10-CM

## 2016-07-30 NOTE — Telephone Encounter (Signed)
-----   Message from [place], MD sent at 07/30/2016 11:44 AM EDT ----- Please be on the lookout for this man's path results next week while I am out of town. DJ agreed to field the call in my absence.   See below, then we need to inform patient and refer to oncology. Thanks,  HD ----- Message ----- From: Milus Banister, MD Sent: 07/29/2016   3:42 PM To: Amy Genia Harold, PA-C, Doran Stabler, MD  Hey, Just completed EUS.  IF the path proves it's adenocarcioma, then Stage IIIa.  6 suspicious lymphnodes is more than usual so I wont be too surprised if this ends up being squamous.  We'll see.    He'll need staging CT scan of chest. After final pathology is back he'll need referrals to medical, radiation (and also surgery if it's adenocarcinoma).  If it's squamous then he does not need surgery likely.  Thanks DJ

## 2016-07-30 NOTE — Telephone Encounter (Signed)
Go ahead and schedule that now.  thanks

## 2016-07-30 NOTE — Telephone Encounter (Signed)
Left message on machine to call back  

## 2016-07-30 NOTE — Telephone Encounter (Signed)
CT scan has been scheduled, med and rad onc referrals made.  Will wait for path to see if [facility] is needed.

## 2016-07-30 NOTE — Telephone Encounter (Signed)
You have been scheduled for a CT scan of the abdomen and pelvis at [facility] ([address]---this is in the same building as [facility]).   You are scheduled on 08/02/16 at 200 pm. You should arrive 15 minutes prior to your appointment time for registration. Please follow the written instructions below on the day of your exam:  WARNING: IF YOU ARE ALLERGIC TO IODINE/X-RAY DYE, PLEASE NOTIFY RADIOLOGY IMMEDIATELY AT [phone]! YOU WILL BE GIVEN A 13 HOUR PREMEDICATION PREP.  1) Do not eat or drink anything after 10 am (4 hours prior to your test)    You may take any medications as prescribed with a small amount of water except for the following: Metformin, Glucophage, Glucovance, Avandamet, Riomet, Fortamet, Actoplus Met, Janumet, Glumetza or Metaglip. The above medications must be held the day of the exam AND 48 hours after the exam.  The purpose of you drinking the oral contrast is to aid in the visualization of your intestinal tract. The contrast solution may cause some diarrhea. Before your exam is started, you will be given a small amount of fluid to drink. Depending on your individual set of symptoms, you may also receive an intravenous injection of x-ray contrast/dye. Plan on being at [facility] for 30 minutes or longer, depending on the type of exam you are having performed.  This test typically takes 30-45 minutes to complete.  If you have any questions regarding your exam or if you need to reschedule, you may call the CT department at [phone] between the hours of 8:00 am and 5:00 pm, Monday-Friday.  ______________________________________________

## 2016-07-30 NOTE — Telephone Encounter (Signed)
Dr Ardis Hughs do you want me to schedule the Chest CT or wait for the pathology to return?

## 2016-07-30 NOTE — Telephone Encounter (Signed)
The pt was given the appt information for the chest CT scan and verbalized understanding of the instructions.

## 2016-07-31 NOTE — ED Provider Notes (Signed)
[facility] DEPT Provider Note   CSN: [id number] Arrival date & time: 07/26/16  [id number]  First Provider Contact:  First MD Initiated Contact with Patient 07/26/16 [phone]        History   Chief Complaint Chief Complaint  Patient presents with  . Abdominal Pain  . Constipation  . Rectal Bleeding    HPI [name] is a 45 y.o. male.  HPI Patient presents to the emergency department with constipation and rectal pain.  The patient states that he is also noted is bleeding over the last few months intermittently.  She states that nothing seems make his condition, better or worse.  She states that he has taken medications for the constipation without relief of his symptoms.  Patient is a heavy smoker 2 packs per day. The patient denies chest pain, shortness of breath, headache,blurred vision, neck pain, fever, cough, weakness, numbness, dizziness, anorexia, edema, abdominal pain, nausea, vomiting, diarrhea, rash, back pain, dysuria, hematemesis,near syncope, or syncope. Past Medical History:  Diagnosis Date  . Poor dental hygiene     There are no active problems to display for this patient.   Past Surgical History:  Procedure Laterality Date  . EUS N/A 07/29/2016   Procedure: LOWER ENDOSCOPIC ULTRASOUND (EUS);  Surgeon: Milus Banister, MD;  Location: [facility];  Service: Endoscopy;  Laterality: N/A;  . TOE AMPUTATION Left        Home Medications    Prior to Admission medications   Medication Sig Start Date End Date Taking? Authorizing Provider  ibuprofen (ADVIL,MOTRIN) 200 MG tablet Take 400 mg by mouth every 6 (six) hours as needed for headache or moderate pain.   Yes Historical Provider, MD  docusate sodium (COLACE) 100 MG capsule Take 1 capsule (100 mg total) by mouth every 12 (twelve) hours. 07/26/16   Dalia Heading, PA-C  HYDROcodone-acetaminophen (NORCO/VICODIN) 5-325 MG tablet Take 1 tablet by mouth every 6 (six) hours as needed for moderate pain. 07/26/16    Dalia Heading, PA-C    Family History Family History  Problem Relation Age of Onset  . Pancreatic cancer Neg Hx   . Esophageal cancer Neg Hx   . Stomach cancer Neg Hx   . Colon cancer Neg Hx   . Liver disease Neg Hx     Social History Social History  Substance Use Topics  . Smoking status: Current Every Day Smoker  . Smokeless tobacco: Never Used  . Alcohol use No     Allergies   Review of patient's allergies indicates no known allergies.   Review of Systems Review of Systems All other systems negative except as documented in the HPI. All pertinent positives and negatives as reviewed in the HPI.  Physical Exam Updated Vital Signs BP 138/82   Pulse (!) 54   Temp 97.6 F (36.4 C) (Oral)   Resp 20   Ht 5\' 11"  (1.803 m)   Wt 81.6 kg   SpO2 100%   BMI 25.10 kg/m   Physical Exam  Constitutional: He is oriented to person, place, and time. He appears well-developed and well-nourished. No distress.  HENT:  Head: Normocephalic and atraumatic.  Mouth/Throat: Oropharynx is clear and moist.  Eyes: Pupils are equal, round, and reactive to light.  Neck: Normal range of motion. Neck supple.  Cardiovascular: Normal rate, regular rhythm and normal heart sounds.  Exam reveals no gallop and no friction rub.   No murmur heard. Pulmonary/Chest: Effort normal and breath sounds normal. No respiratory distress. He has no  wheezes.  Abdominal: Soft. Bowel sounds are normal. He exhibits no distension and no mass. There is tenderness. There is no guarding.  Neurological: He is alert and oriented to person, place, and time. He exhibits normal muscle tone. Coordination normal.  Skin: Skin is warm and dry. No rash noted. No erythema.  Psychiatric: He has a normal mood and affect. His behavior is normal.  Nursing note and vitals reviewed.    ED Treatments / Results  Labs (all labs ordered are listed, but only abnormal results are displayed) Labs Reviewed  COMPREHENSIVE METABOLIC  PANEL - Abnormal; Notable for the following:       Result Value   Calcium 8.1 (*)    Total Protein 4.4 (*)    Albumin 2.4 (*)    ALT 12 (*)    Anion gap 4 (*)    All other components within normal limits  LIPASE, BLOOD  CBC  URINALYSIS, ROUTINE W REFLEX MICROSCOPIC (NOT AT [facility])    EKG  EKG Interpretation None       Radiology No results found.  Procedures Procedures (including critical care time)  Medications Ordered in ED Medications  sodium chloride 0.9 % bolus 1,000 mL (0 mLs Intravenous Stopped 07/26/16 1520)  iopamidol (ISOVUE-300) 61 % injection (100 mLs  Contrast Given 07/26/16 1049)  oxyCODONE-acetaminophen (PERCOCET/ROXICET) 5-325 MG per tablet 1 tablet (1 tablet Oral Given 07/26/16 1444)     Initial Impression / Assessment and Plan / ED Course  I have reviewed the triage vital signs and the nursing notes.  Pertinent labs & imaging results that were available during my care of the patient were reviewed by me and considered in my medical decision making (see chart for details).  Clinical Course    I advised the patient of the CT scan findings of a possible rectal mass.  I spoke with GI, who will see him in follow-up tomorrow at 2:30 PM patient voiced an understanding and all questions were answered  Final Clinical Impressions(s) / ED Diagnoses   Final diagnoses:  Rectal mass  Rectal bleeding  Constipation, unspecified constipation type    New Prescriptions Discharge Medication List as of 07/26/2016  3:02 PM    START taking these medications   Details  docusate sodium (COLACE) 100 MG capsule Take 1 capsule (100 mg total) by mouth every 12 (twelve) hours., Starting Mon 07/26/2016, Print         Dalia Heading, PA-C 07/31/16 [place], MD 08/02/16 [phone]

## 2016-08-02 ENCOUNTER — Other Ambulatory Visit: Payer: Self-pay

## 2016-08-02 ENCOUNTER — Ambulatory Visit (INDEPENDENT_AMBULATORY_CARE_PROVIDER_SITE_OTHER)
Admission: RE | Admit: 2016-08-02 | Discharge: 2016-08-02 | Disposition: A | Discharge status: Home / Self Care | Payer: Medicaid Other | Source: Ambulatory Visit | Attending: Gastroenterology | Admitting: Gastroenterology

## 2016-08-02 ENCOUNTER — Telehealth: Payer: Self-pay | Admitting: Gastroenterology

## 2016-08-02 DIAGNOSIS — R198 Other specified symptoms and signs involving the digestive system and abdomen: Secondary | ICD-10-CM

## 2016-08-02 DIAGNOSIS — K6289 Other specified diseases of anus and rectum: Secondary | ICD-10-CM

## 2016-08-02 MED ORDER — IOPAMIDOL (ISOVUE-300) INJECTION 61%
80.0000 mL | Freq: Once | INTRAVENOUS | Status: AC | PRN
  Administered 2016-08-02: 80 mL via INTRAVENOUS

## 2016-08-02 MED ORDER — HYDROCODONE-ACETAMINOPHEN 5-325 MG PO TABS: 1.0000 | ORAL_TABLET | Freq: Four times a day (QID) | ORAL | 0 refills | Status: DC | PRN

## 2016-08-02 NOTE — Telephone Encounter (Signed)
Dr Ardis Hughs since you are familiar with this pt and Dr Loletha Carrow is not in the office for the next few days, can you refill the request for Hydrocodone 5-325 mg. This pt has not established a primary doctor yet. He is scheduled to see oncology on Friday to discuss his treatment plan. Please advise. Thank you

## 2016-08-02 NOTE — Telephone Encounter (Signed)
I do not know the patient and cannot prescribe that medication. If he is in severe pain he should go to urgent care or the ER

## 2016-08-02 NOTE — Telephone Encounter (Signed)
Oncology Nurse Navigator Documentation  Oncology Nurse Navigator Flowsheets 08/02/2016  Navigator Location [facility]  Navigator Encounter Type Introductory phone call  Abnormal Finding Date 07/26/2016  Confirmed Diagnosis Date 07/29/2016  Spoke with wife, Myriam Jacobson and provided new patient appointment for 08/06/16 at 0830/0930 in [facility] seeing Dr. Lisbeth Renshaw and Dr. Burr Medico. Wife has experience at [facility] with her father having a head and neck cancer, so she is aware of procedure. Informed of location of [place], valet service, and registration process. Reminded to bring photo ID, insurance cards and a current medication list, including supplements. Wife verbalizes understanding. She has letter that he has been approved for Medicaid, but no card yet. She will bring his letter to the appointment. Notified HIM to enter appointment into EPIC.

## 2016-08-02 NOTE — Telephone Encounter (Signed)
Yes, he can have refills for hydrocodone at that dose for 50 pills.  Thanks

## 2016-08-02 NOTE — Telephone Encounter (Signed)
Rx sighed by J.Zehr PA as Dr Loletha Carrow and Dr Ardis Hughs are not in clinic.  Left message for pt to return call

## 2016-08-02 NOTE — Telephone Encounter (Signed)
Pt is requesting pain medication Hydrocodone 5-325 mg po every 6 hours as needed. He doesn't have a primary care doctor yet. He does  have an appointment to see and establish care with oncology for his rectal cancer. Please advise on refills.

## 2016-08-03 ENCOUNTER — Telehealth: Payer: Self-pay

## 2016-08-03 NOTE — Telephone Encounter (Signed)
-----   Message from Barron Alvine, RN sent at 08/02/2016 10:11 AM EDT -----   ----- Message ----- From: Barron Alvine, RN Sent: 08/02/2016 To: Barron Alvine, RN  Look for path and recommendation for [facility], [name] pt.  Ardis Hughs will review

## 2016-08-03 NOTE — Telephone Encounter (Signed)
Rectal mass biopsy,  Dr Jac Canavan pathologist called and states the mass was an adenocarcinoma.  Dr Ardis Hughs Dr Loletha Carrow said you would review in his absence.  Please advise.

## 2016-08-03 NOTE — Telephone Encounter (Signed)
Ok, I explained the final path results. Also talked with him about CT scan (possible mets in chest), will allow oncology team to decide further testing (with probable PET scan).  He needs referral to [facility], Dr. Leighton Ruff for newly diagnosed rectal adenocarcinoma.  Thanks    Mallie Mussel, Juluis Rainier

## 2016-08-03 NOTE — Telephone Encounter (Signed)
The patient has been notified of this information and all questions answered.   You have been scheduled for an appointment with [name] at [facility]. Your appointment is on 08/10/16 at 3:40 pm. Please arrive at 3 pm for registration. Make certain to bring a list of current medications, including any over the counter medications or vitamins. Also bring your co-pay if you have one as well as your insurance cards. [facility] is located at [employer] inside the professional building shared with [facility]. Should you need to reschedule your appointment, please contact them at [phone].

## 2016-08-04 NOTE — Telephone Encounter (Signed)
Recall has been entered into EPIC for 1 year

## 2016-08-04 NOTE — Telephone Encounter (Signed)
Patty,   please recall for colonoscopy in one year. I reviewed the biopsy result and see that he is all set to see oncology and surgery

## 2016-08-05 ENCOUNTER — Encounter: Payer: Self-pay | Admitting: Gastroenterology

## 2016-08-05 NOTE — Telephone Encounter (Signed)
Pt still hasn't picked up RX for pain medication. Spoke to wife who states they will be by the office tomorrow and will pick up then.

## 2016-08-06 ENCOUNTER — Encounter: Payer: Self-pay | Admitting: Hematology

## 2016-08-06 ENCOUNTER — Encounter: Payer: Self-pay | Admitting: *Deleted

## 2016-08-06 ENCOUNTER — Ambulatory Visit: Payer: Medicaid Other | Admitting: Nutrition

## 2016-08-06 ENCOUNTER — Ambulatory Visit (HOSPITAL_BASED_OUTPATIENT_CLINIC_OR_DEPARTMENT_OTHER): Payer: Medicaid Other | Admitting: Hematology

## 2016-08-06 ENCOUNTER — Ambulatory Visit
Admission: RE | Admit: 2016-08-06 | Discharge: 2016-08-06 | Disposition: A | Discharge status: Home / Self Care | Payer: Medicaid Other | Source: Ambulatory Visit | Attending: Radiation Oncology | Admitting: Radiation Oncology

## 2016-08-06 ENCOUNTER — Telehealth: Payer: Self-pay | Admitting: Hematology

## 2016-08-06 VITALS — BP 107/75 | HR 73 | Temp 97.7°F | Resp 18 | Ht 71.0 in | Wt 149.5 lb

## 2016-08-06 DIAGNOSIS — C20 Malignant neoplasm of rectum: Secondary | ICD-10-CM

## 2016-08-06 DIAGNOSIS — R918 Other nonspecific abnormal finding of lung field: Secondary | ICD-10-CM

## 2016-08-06 DIAGNOSIS — K6289 Other specified diseases of anus and rectum: Secondary | ICD-10-CM | POA: Insufficient documentation

## 2016-08-06 HISTORY — DX: Malignant neoplasm of rectum: C20

## 2016-08-06 MED ORDER — HYDROCODONE-ACETAMINOPHEN 5-325 MG PO TABS: 1.0000 | ORAL_TABLET | Freq: Four times a day (QID) | ORAL | 0 refills | Status: DC | PRN

## 2016-08-06 MED ORDER — CAPECITABINE 500 MG PO TABS: 825.0000 mg/m2 | ORAL_TABLET | Freq: Two times a day (BID) | ORAL | 0 refills | Status: DC

## 2016-08-06 MED FILL — CAPECITABINE 500 MG TABLET: 500 | 21 days supply | Qty: 126 | Fill #0

## 2016-08-06 NOTE — Progress Notes (Addendum)
[place]  [phone] [phone]  Clinic New Consult Note   Patient Care Team: [name] as PCP - General (General Practice) 08/06/2016  CHIEF COMPLAINTS/PURPOSE OF CONSULTATION:  Newly diagnosed rectal cancer  Oncology History   Rectal adenocarcinoma [facility])   Staging form: Colon and Rectum, AJCC 7th Edition   - Clinical stage from 07/28/2016: Stage IIIB (T3, N2a, M0) - Signed by Truitt Merle, MD on 08/06/2016      Rectal adenocarcinoma ([place])   07/26/2016 Imaging    CT chest, abdomen and pelvis with contrast showed nodular thickening of the rectal wall, enlarged perirectal lymph nodes. Diverticulosis. Nonspecific low-level lymphadenopathy. Lower lobe emphysema, small 3-4 mm left lower lobe lung nodules.      07/28/2016 Initial Diagnosis    Rectal adenocarcinoma ([place])      07/28/2016 Initial Biopsy    Rectal mass biopsy showed invasive adenocarcinoma, polyps in the ascending colon and rectum showed tubular adenoma       07/28/2016 Procedure    Colonoscopy showed a nearly circumferential mass in the distal rectum, 4 polyps in the descending colon, diverticulosis in the left colon.       07/29/2016 Procedure    Low EUS showed clearly malignant 6 cm long, nearly circumferential mass in the distal rectum, with distal edge located to 1 cm from the annual approach, uT3 N2 A       HISTORY OF PRESENTING ILLNESS:  [name] 45 y.o. male is here because of His newly diagnosed rectal cancer. He is accompanied by his girlfriend to our multidisciplinary GI clinic today.  He has had rectal pain for 6 weeks, and mild rectal bleeding with BM, he goes 4 times a day, very little each time, and pencil shaped stool, he has lost 30 lbs so far. His appettie remains to be good, he eats well, no cough, nausea, no abd pain. He has a moderate fatigue, he will 2 tolerate his routine activities including his job without much difficulty. He does not have a primary  care physician, and has not seen doctors for many years.  He presents to emergency room on 07/26/2016 for the rectal pain and bleeding, and was referred to gastroenterologist Dr. Loletha Carrow. He underwent colonoscopy on 07/28/2016, which showed 4 polyps in the ascending colon, and a circumferential distal rectal mass. Biopsy of the rectal mass showed adenocarcinoma. He underwent EUS by Dr. Ardis Hughs on 07/29/2016 which showed a T3 N2 disease.   MEDICAL HISTORY:  Past Medical History:  Diagnosis Date  . Poor dental hygiene        SURGICAL HISTORY: Past Surgical History:  Procedure Laterality Date  . EUS N/A 07/29/2016   Procedure: LOWER ENDOSCOPIC ULTRASOUND (EUS);  Surgeon: Milus Banister, MD;  Location: [facility];  Service: Endoscopy;  Laterality: N/A;  . TOE AMPUTATION Left     SOCIAL HISTORY: Social History   Social History  . Marital status: Divorced    Spouse name: N/A  . Number of children: N/A  . Years of education: N/A   Occupational History  . Not on file.   Social History Main Topics  . Smoking status: Current Every Day Smoker    Packs/day: 1.50    Years: 30.00  . Smokeless tobacco: Never Used  . Alcohol use No  . Drug use:     Types: Marijuana     Comment: patient denies  . Sexual activity: Not on file   Other Topics Concern  . Not  on file   Social History Narrative  . No narrative on file    FAMILY HISTORY: Family History  Problem Relation Age of Onset  . Diabetes Mother   . COPD Father   . Pancreatic cancer Neg Hx   . Esophageal cancer Neg Hx   . Stomach cancer Neg Hx   . Colon cancer Neg Hx   . Liver disease Neg Hx    He lives with his girfriend the their 6 yo son, works for [employer]   ALLERGIES:  has No Known Allergies.  MEDICATIONS:  Current Outpatient Prescriptions  Medication Sig Dispense Refill  . docusate sodium (COLACE) 100 MG capsule Take 1 capsule (100 mg total) by mouth every 12 (twelve) hours. 60 capsule 0  . ibuprofen (ADVIL,MOTRIN)  200 MG tablet Take 400 mg by mouth every 6 (six) hours as needed for headache or moderate pain.    . capecitabine (XELODA) 500 MG tablet Take 3 tablets (1,500 mg total) by mouth 2 (two) times daily after a meal. 180 tablet 0  . HYDROcodone-acetaminophen (NORCO/VICODIN) 5-325 MG tablet Take 1 tablet by mouth every 6 (six) hours as needed for moderate pain. 40 tablet 0   Current Facility-Administered Medications  Medication Dose Route Frequency Provider Last Rate Last Dose  . 0.9 %  sodium chloride infusion  500 mL Intravenous Continuous Nelida Meuse III, MD        REVIEW OF SYSTEMS:   Constitutional: Denies fevers, chills or abnormal night sweats Eyes: Denies blurriness of vision, double vision or watery eyes Ears, nose, mouth, throat, and face: Denies mucositis or sore throat Respiratory: Denies cough, dyspnea or wheezes Cardiovascular: Denies palpitation, chest discomfort or lower extremity swelling Gastrointestinal:  Denies nausea, heartburn or change in bowel habits Skin: Denies abnormal skin rashes Lymphatics: Denies new lymphadenopathy or easy bruising Neurological:Denies numbness, tingling or new weaknesses Behavioral/Psych: Mood is stable, no new changes  All other systems were reviewed with the patient and are negative.  PHYSICAL EXAMINATION: ECOG PERFORMANCE STATUS: 1 - Symptomatic but completely ambulatory  Vitals:   08/06/16 0823  BP: 107/75  Pulse: 73  Resp: 18  Temp: 97.7 F (36.5 C)   Filed Weights   08/06/16 0823  Weight: 149 lb 8 oz (67.8 kg)    GENERAL:alert, no distress and comfortable SKIN: skin color, texture, turgor are normal, no rashes or significant lesions EYES: normal, conjunctiva are pink and non-injected, sclera clear OROPHARYNX:no exudate, no erythema and lips, buccal mucosa, and tongue normal  NECK: supple, thyroid normal size, non-tender, without nodularity LYMPH:  no palpable lymphadenopathy in the cervical, axillary or inguinal LUNGS:  clear to auscultation and percussion with normal breathing effort HEART: regular rate & rhythm and no murmurs and no lower extremity edema ABDOMEN:abdomen soft, non-tender and normal bowel sounds. Rectal exam showed a circumferential distal rectal mass, no blood on glove. Musculoskeletal:no cyanosis of digits and no clubbing  PSYCH: alert & oriented x 3 with fluent speech NEURO: no focal motor/sensory deficits  LABORATORY DATA:  I have reviewed the data as listed CBC Latest Ref Rng & Units 07/26/2016  WBC 4.0 - 10.5 K/uL 7.8  Hemoglobin 13.0 - 17.0 g/dL 15.9  Hematocrit 39.0 - 52.0 % 48.4  Platelets 150 - 400 K/uL 257   CMP Latest Ref Rng & Units 07/26/2016  Glucose 65 - 99 mg/dL 79  BUN 6 - 20 mg/dL 7  Creatinine 0.61 - 1.24 mg/dL 1.02  Sodium 135 - 145 mmol/L 138  Potassium 3.5 -  5.1 mmol/L 4.0  Chloride 101 - 111 mmol/L 108  CO2 22 - 32 mmol/L 26  Calcium 8.9 - 10.3 mg/dL 8.1(L)  Total Protein 6.5 - 8.1 g/dL 4.4(L)  Total Bilirubin 0.3 - 1.2 mg/dL 0.5  Alkaline Phos 38 - 126 U/L 55  AST 15 - 41 U/L 16  ALT 17 - 63 U/L 12(L)   Pathology report  Diagnosis 07/28/2016 1. Surgical [P], descending, polyps(2) -TUBULAR ADENOMAS. -NO HIGH GRADE DYSPLASIA OR MALIGNANCY IDENTIFIED. 2. Surgical [P], rectal mass -INVASIVE ADENOCARCINOMA. -SEE COMMENT. 3. Surgical [P], rectum, polyps(2) -TUBULAR ADENOMAS. -NO HIGH GRADE DYSPLASIA OR MALIGNANCY IDENTIFIED. Microscopic Comment 2. Internal departmental review obtained (Dr. Saralyn Pilar) with agreement. Results are phoned to [facility], nurse in the office of Dr. Loletha Carrow. (MEG 08/03/16)  RADIOGRAPHIC STUDIES: I have personally reviewed the radiological images as listed and agreed with the findings in the report. Ct Chest W Contrast  Result Date: 08/02/2016 CLINICAL DATA:  Newly diagnosed rectal mass. Evaluate for lung metastasis. EXAM: CT CHEST WITH CONTRAST TECHNIQUE: Multidetector CT imaging of the chest was performed during intravenous contrast  administration. CONTRAST:  61mL ISOVUE-300 IOPAMIDOL (ISOVUE-300) INJECTION 61% COMPARISON:  CT abdomen pelvis 07/26/2016 FINDINGS: Cardiovascular: Unremarkable Mediastinum/Nodes: Prominent RIGHT axial lymph nodes are not pathologic by size criteria measuring 10 mm short axis. RIGHT low paratracheal lymph node measures 10 mm short axis. RIGHT paratracheal node measures 9 mm short axis (image 39, series 2). Lungs/Pleura: Centrilobular emphysema in the upper lobes. Subpleural nodule within the superior aspect of the RIGHT lower lobe measures 3 mm (image 35, series 3). Subpleural nodule in the RIGHT lower lobe measures 4 mm image 53, series 3). Nodule in the superior aspect of the LEFT oblique fissure measures 5 mm (image 34, series 3). Parenchymal nodule in the LEFT upper lobe measures 4 mm (image 35, series 3). Parenchymal nodule in the LEFT lower lobe measures 4 mm on image 71, series 3 Upper Abdomen: Limited view of the liver, kidneys, pancreas are unremarkable. Normal adrenal glands. Musculoskeletal: No aggressive osseous lesion IMPRESSION: 1. Bilateral pulmonary nodules in patient with rectal carcinoma is a concerning finding for pulmonary metastasis. While these nodules are at the lower limits of accurate PET characterization, would still consider an FDG PET scan for evaluation. 2. Borderline mediastinal adenopathy differential including reactive adenopathy related emphysema versus metastatic rectal carcinoma. Electronically Signed   By: Suzy Bouchard M.D.   On: 08/02/2016 15:27   Ct Abdomen Pelvis W Contrast  Result Date: 07/26/2016 CLINICAL DATA:  45 year old male with intermittent diffuse abdominal pain and low back pain for 1 month. Initial encounter. EXAM: CT ABDOMEN AND PELVIS WITH CONTRAST TECHNIQUE: Multidetector CT imaging of the abdomen and pelvis was performed using the standard protocol following bolus administration of intravenous contrast. CONTRAST:  148mL ISOVUE-300 IOPAMIDOL (ISOVUE-300)  INJECTION 61% COMPARISON:  Chest radiographs 02/15/2007. FINDINGS: Centrilobular and paraseptal emphysema at the lung bases. Up to mild lower lobe peribronchial thickening. 3-4 mm left lower lobe pulmonary nodule on series 3, image 3. Similar small nodule along the left major fissure on image 4. No pericardial or pleural effusion. Chronic bilateral L5 pars fractures. Trace associated grade 1 anterolisthesis at L5-S1. No associated spinal stenosis. Incidental S1 spina bifida occulta (normal variant). No acute osseous abnormality identified. No pelvic free fluid. However, there is mild thickening and irregularity of the mid to distal rectal wall as seen on series 2, image 78. Up to 12 mm lobular areas of wall thickening suggested with hyper enhancement. No perirectal stranding.  See coronal image 84. Proximal rectum has a more normal appearance. The sigmoid colon is redundant with moderate diverticulosis, but no definite active inflammation. Suggestion of wall thickening at the proximal sigmoid and distal descending colon may be artifact due to under distension. The proximal descending colon has a more normal appearance. There are occasional diverticula there are and at the splenic flexure with no active inflammation identified. Negative transverse colon, right colon and appendix. Negative terminal ileum. No dilated small bowel. Questionable mild small bowel mural hyper enhancement, with similar findings at the stomach and duodenum which otherwise appear negative. No abdominal free fluid or free air. Liver, gallbladder, pancreas, adrenal glands, and kidneys appear normal. Negative urinary bladder. The spleen is normal aside from a circumscribed cyst with simple fluid density at the superior pole measuring 12 mm. This abuts the capsule and appears benign (series 2, image 11). Portal venous system is patent. Major arterial structures appear normal. Mildly increased in number lymph nodes throughout the abdomen and pelvis,  however most remain normal by size criteria. Of note, there are rounded perirectal lymph nodes up to 8-9 mm (coronal image 106). No pelvic sidewall lymphadenopathy. IMPRESSION: 1. Nodular or thickening and hyper enhancement of the rectal wall with maximal to mildly enlarged and rounded perirectal lymph nodes. This could be inflammatory (proctitis), but the appearance is suspicious for Rectal Carcinoma. GI consultation and Endoscopy recommended. 2. Diverticulosis of the left and sigmoid colon without active inflammation, but questionable sigmoid wall thickening. Questionable small bowel and stomach mural hyper-enhancement which raises the possibility of an infectious or inflammatory gastroenteritis. The terminal ileum appears spared. 3. Nonspecific low level lymphadenopathy elsewhere in the abdomen and pelvis. 4. Lower lobe emphysema and to small 3-4 mm left lower lobe lung nodules. No follow-up needed if patient is low-risk (and has no known or suspected primary neoplasm). Non-contrast chest CT can be considered in 12 months if patient is high-risk. This recommendation follows the consensus statement: Guidelines for Management of Incidental Pulmonary Nodules Detected on CT Images:From the Fleischner Society 2017; published online before print (10.1148/radiol.SG:5268862). 5. 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 Electronically Signed   By: Genevie Ann M.D.   On: 07/26/2016 11:22   EUS 07/29/2016 -Clearly malignant 6cm long, nearly circumferential mass in the distal rectum with distal edge located 1cm from the anal verge. If this is rectal adenocarcinoma it is uT3N2a, Stage IIIa.  COLONOSCOPY 07/28/2016 - Rectal mass. - Four 4 mm polyps in the rectum and in the descending colon, removed with a cold snare. Resected and retrieved. - Diverticulosis in the left colon. - Likely malignant tumor in the distal rectum. Biopsied. Tattooed.   ASSESSMENT & PLAN: 45 year old male without  significant past medical history presented with rectal pain and bleeding.  1. 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 He also has a slightly prominent periaortic lymph node, about 8 mm. -We recommend him to have a PET scan to further evaluate distant metastasis -Reviewed the nature history of rectal cancer, and high-risk of cancer recurrence after surgery, even if this is stage III disease. If he does have pulmonary metastasis, then his disease is not curable -Due to his significant symptoms  from his rectal cancer, regardless if he has metastatic disease, I think concurrent chemoradiation would be the best first treatment option for now, both for symptom management, or curative intent if no definitive evidence of metastasis.  -I discussed the option of concurrent chemotherapy with radiation, including 5-FU infusion and Xeloda. Due to the convenience, he opted to Xeloda. I'll send a prescription to [facility] today -Chemotherapy consent: Side effects including but does not not limited to, fatigue, nausea, vomiting, diarrhea, hair loss, neuropathy, fluid retention, renal and kidney dysfunction, neutropenic fever, needed for blood transfusion, bleeding, coronary artery spasm, heart attack, etc., were discussed with patient in great detail. He agrees to proceed. -He was seen by a radiation oncologist Dr. Lisbeth Renshaw today, plan to start radiation later next week.  Plan -PET -lab and chemo class next Monday -see him back in one week -he wills tart Xeloda on the first day of radiation    Orders Placed This Encounter  Procedures  . NM PET Image Initial (PI) Skull Base  To Thigh    Standing Status:   Future    Standing Expiration Date:   08/06/2017    Order Specific Question:   Reason for Exam (SYMPTOM  OR DIAGNOSIS REQUIRED)    Answer:   Staging Rectal Cancer    Order Specific Question:   Preferred imaging location?    Answer:   [facility]    Order Specific Question:   If indicated for the ordered procedure, I authorize the administration of a radiopharmaceutical per Radiology protocol    Answer:   Yes  . Ambulatory referral to Genetics    Referral Priority:   Routine    Referral Type:   Consultation    Referral Reason:   Specialty Services Required    Number of Visits Requested:   1    All questions were answered. The patient knows to call the clinic with any problems, questions or concerns. I spent 55 minutes counseling the patient face to face. The total time spent in the appointment was 60 minutes and more than 50% was on counseling.     Truitt Merle, MD 08/06/2016 11:43 AM

## 2016-08-06 NOTE — Progress Notes (Signed)
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   HISTORY OF PRESENT ILLNESS: [name] is a 45 y.o. male seen at the request of Dr.  Loletha Carrow and Dr. Ardis Hughs for a newly diagnosed rectal cancer. The patient was originally seen due to ongoing abdominal pain and rectal pain for one month. A CT in the ED revealed nodular thickening and enhancement of the rectal wall with mildly enlarged perirectal lymph nodes, nonspecific lymphadenopathy in the abdomen, and left lower lobe pulmonary nodules 3-4 mm.On 07/28/16 he underwent colonoscopy and a large rectal mass was noted and biopsied, consistent with adenocarcinoma. He underwent endoscopic ultrasound which revealed a large 6 cm long, nearly circumferential mass 1 cm from the anal verge with 6 suspicious perirectal lymph nodes. Recommendatiosn at multidisciplinary GI conference were to proceed with PET scan, radiation with concurrent chemotherapy, 2 follow his pulmonary nodules and to restage following treatment with PET scan. Surgical consideration would be a at that time. He comes today to discuss the options for radiotherapy with Dr. Lisbeth Renshaw.    PREVIOUS RADIATION THERAPY: No   PAST MEDICAL HISTORY:  Past Medical History:  Diagnosis Date  . Poor dental hygiene        PAST SURGICAL HISTORY: Past Surgical History:  Procedure Laterality Date  . EUS N/A 07/29/2016   Procedure: LOWER ENDOSCOPIC ULTRASOUND (EUS);  Surgeon: Milus Banister, MD;  Location: [facility];  Service: Endoscopy;  Laterality: N/A;  . TOE AMPUTATION Left      FAMILY HISTORY:  Family History  Problem Relation Age of Onset  . Pancreatic cancer Neg Hx   . Esophageal cancer Neg Hx   . Stomach cancer Neg Hx   . Colon  cancer Neg Hx   . Liver disease Neg Hx      SOCIAL HISTORY:  reports that he has been smoking.  He has never used smokeless tobacco. He reports that he uses drugs, including Marijuana. He reports that he does not drink alcohol. The patient is in a common law marriage and lives in [place]. He has 4 children, the youngest lives with him and is 46 years old. 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    ALLERGIES: Review of patient's allergies indicates no known allergies.   MEDICATIONS:  Current Outpatient Prescriptions  Medication Sig Dispense Refill  . docusate sodium (COLACE) 100 MG capsule Take 1 capsule (100 mg total) by mouth every 12 (twelve) hours. 60 capsule 0  . ibuprofen (ADVIL,MOTRIN) 200 MG tablet Take 400 mg by mouth every 6 (six) hours as needed for headache or moderate pain.     Current Facility-Administered Medications  Medication Dose Route Frequency Provider Last Rate Last Dose  . 0.9 %  sodium chloride infusion  500 mL Intravenous Continuous Nelida Meuse III, MD         REVIEW OF SYSTEMS: On review of systems, the patient reports that he is doing ok but has been without pain medication for several days. He describes pain in the low pelvis and rectum following BMs, and low back pain. He denies symptoms of radiculopathy. He denies any chest pain, shortness of breath, cough, fevers, chills, night sweats. He has lost about 25 pounds in the  last two months. He does note rectal bleeding bowel movements and states that this turns the toilet water red. He denies bleeding outside of with bowel movements. He denies any bladder disturbances, and denies abdominal pain, nausea or vomiting. He denies any new musculoskeletal or joint aches or pains. A complete review of systems is obtained and is otherwise negative.     PHYSICAL EXAM:   Pain scale 6/10 In general this is a well appearing native [place] male in no acute distress. He is alert and oriented x4 and  appropriate throughout the examination. HEENT reveals that the patient is normocephalic, atraumatic. EOMs are intact. PERRLA. Skin is intact without any evidence of gross lesions. Cardiovascular exam reveals a regular rate and rhythm, no clicks rubs or murmurs are auscultated. Chest is clear to auscultation bilaterally. Lymphatic assessment is performed and does not reveal any adenopathy in the cervical, supraclavicular, axillary, or inguinal chains. Abdomen has active bowel sounds in all quadrants and is intact. The abdomen is soft, non tender, non distended. Lower extremities are negative for pretibial pitting edema, deep calf tenderness, cyanosis or clubbing. Rectal exam is deferred.   ECOG = 1  0 - Asymptomatic (Fully active, able to carry on all predisease activities without restriction)  1 - Symptomatic but completely ambulatory (Restricted in physically strenuous activity but ambulatory and able to carry out work of a light or sedentary nature. For example, light housework, office work)  2 - Symptomatic, <50% in bed during the day (Ambulatory and capable of all self care but unable to carry out any work activities. Up and about more than 50% of waking hours)  3 - Symptomatic, >50% in bed, but not bedbound (Capable of only limited self-care, confined to bed or chair 50% or more of waking hours)  4 - Bedbound (Completely disabled. Cannot carry on any self-care. Totally confined to bed or chair)  5 - Death   Eustace Pen MM, Creech RH, Tormey DC, et al. [phone]). "Toxicity and response criteria of the [facility]". [place] Oncol. 5 (6): 649-55    LABORATORY DATA:  Lab Results  Component Value Date   WBC 7.8 07/26/2016   HGB 15.9 07/26/2016   HCT 48.4 07/26/2016   MCV 88.3 07/26/2016   PLT 257 07/26/2016   Lab Results  Component Value Date   NA 138 07/26/2016   K 4.0 07/26/2016   CL 108 07/26/2016   CO2 26 07/26/2016   Lab Results  Component Value Date    ALT 12 (L) 07/26/2016   AST 16 07/26/2016   ALKPHOS 55 07/26/2016   BILITOT 0.5 07/26/2016      RADIOGRAPHY: Ct Chest W Contrast  Result Date: 08/02/2016 CLINICAL DATA:  Newly diagnosed rectal mass. Evaluate for lung metastasis. EXAM: CT CHEST WITH CONTRAST TECHNIQUE: Multidetector CT imaging of the chest was performed during intravenous contrast administration. CONTRAST:  80mL ISOVUE-300 IOPAMIDOL (ISOVUE-300) INJECTION 61% COMPARISON:  CT abdomen pelvis 07/26/2016 FINDINGS: Cardiovascular: Unremarkable Mediastinum/Nodes: Prominent RIGHT axial lymph nodes are not pathologic by size criteria measuring 10 mm short axis. RIGHT low paratracheal lymph node measures 10 mm short axis. RIGHT paratracheal node measures 9 mm short axis (image 39, series 2). Lungs/Pleura: Centrilobular emphysema in the upper lobes. Subpleural nodule within the superior aspect of the RIGHT lower lobe measures 3 mm (image 35, series 3). Subpleural nodule in the RIGHT lower lobe measures 4 mm image 53, series 3). Nodule in the superior aspect of the LEFT oblique fissure measures 5  mm (image 34, series 3). Parenchymal nodule in the LEFT upper lobe measures 4 mm (image 35, series 3). Parenchymal nodule in the LEFT lower lobe measures 4 mm on image 71, series 3 Upper Abdomen: Limited view of the liver, kidneys, pancreas are unremarkable. Normal adrenal glands. Musculoskeletal: No aggressive osseous lesion IMPRESSION: 1. Bilateral pulmonary nodules in patient with rectal carcinoma is a concerning finding for pulmonary metastasis. While these nodules are at the lower limits of accurate PET characterization, would still consider an FDG PET scan for evaluation. 2. Borderline mediastinal adenopathy differential including reactive adenopathy related emphysema versus metastatic rectal carcinoma. Electronically Signed   By: Suzy Bouchard M.D.   On: 08/02/2016 15:27   Ct Abdomen Pelvis W Contrast  Result Date: 07/26/2016 CLINICAL DATA:   45 year old male with intermittent diffuse abdominal pain and low back pain for 1 month. Initial encounter. EXAM: CT ABDOMEN AND PELVIS WITH CONTRAST TECHNIQUE: Multidetector CT imaging of the abdomen and pelvis was performed using the standard protocol following bolus administration of intravenous contrast. CONTRAST:  182mL ISOVUE-300 IOPAMIDOL (ISOVUE-300) INJECTION 61% COMPARISON:  Chest radiographs 02/15/2007. FINDINGS: Centrilobular and paraseptal emphysema at the lung bases. Up to mild lower lobe peribronchial thickening. 3-4 mm left lower lobe pulmonary nodule on series 3, image 3. Similar small nodule along the left major fissure on image 4. No pericardial or pleural effusion. Chronic bilateral L5 pars fractures. Trace associated grade 1 anterolisthesis at L5-S1. No associated spinal stenosis. Incidental S1 spina bifida occulta (normal variant). No acute osseous abnormality identified. No pelvic free fluid. However, there is mild thickening and irregularity of the mid to distal rectal wall as seen on series 2, image 78. Up to 12 mm lobular areas of wall thickening suggested with hyper enhancement. No perirectal stranding. See coronal image 84. Proximal rectum has a more normal appearance. The sigmoid colon is redundant with moderate diverticulosis, but no definite active inflammation. Suggestion of wall thickening at the proximal sigmoid and distal descending colon may be artifact due to under distension. The proximal descending colon has a more normal appearance. There are occasional diverticula there are and at the splenic flexure with no active inflammation identified. Negative transverse colon, right colon and appendix. Negative terminal ileum. No dilated small bowel. Questionable mild small bowel mural hyper enhancement, with similar findings at the stomach and duodenum which otherwise appear negative. No abdominal free fluid or free air. Liver, gallbladder, pancreas, adrenal glands, and kidneys appear  normal. Negative urinary bladder. The spleen is normal aside from a circumscribed cyst with simple fluid density at the superior pole measuring 12 mm. This abuts the capsule and appears benign (series 2, image 11). Portal venous system is patent. Major arterial structures appear normal. Mildly increased in number lymph nodes throughout the abdomen and pelvis, however most remain normal by size criteria. Of note, there are rounded perirectal lymph nodes up to 8-9 mm (coronal image 106). No pelvic sidewall lymphadenopathy. IMPRESSION: 1. Nodular or thickening and hyper enhancement of the rectal wall with maximal to mildly enlarged and rounded perirectal lymph nodes. This could be inflammatory (proctitis), but the appearance is suspicious for Rectal Carcinoma. GI consultation and Endoscopy recommended. 2. Diverticulosis of the left and sigmoid colon without active inflammation, but questionable sigmoid wall thickening. Questionable small bowel and stomach mural hyper-enhancement which raises the possibility of an infectious or inflammatory gastroenteritis. The terminal ileum appears spared. 3. Nonspecific low level lymphadenopathy elsewhere in the abdomen and pelvis. 4. Lower lobe emphysema and to small 3-4  mm left lower lobe lung nodules. No follow-up needed if patient is low-risk (and has no known or suspected primary neoplasm). Non-contrast chest CT can be considered in 12 months if patient is high-risk. This recommendation follows the consensus statement: Guidelines for Management of Incidental Pulmonary Nodules Detected on CT Images:From the Fleischner Society 2017; published online before print (10.1148/radiol.IJ:2314499). 5. 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 Electronically Signed   By: Genevie Ann M.D.   On: 07/26/2016 11:22       IMPRESSION/PLAN: 1. Stage IIIa, T3, N2a, Mx adenocarcinoma of the rectum. Dr. Lisbeth Renshaw discusses the findings from the patient's pathology, colonoscopy, CT,  endoscopic ultrasound. He reviews the rationale for proceeding with PET/CT to rule out metastatic disease. He reviews the patient and his wife as well the recommendations provided that does not reveal metastatic cancer, that we would move forward with neoadjuvant chemotherapy and radiation. Dr. Burr Medico will discuss the role for 5-FU and Xeloda. If the PET scan however reveals metastatic disease, Dr. Lisbeth Renshaw does discuss the options for palliative radiotherapy as well. He reviews that if definitive treatment is performed this would be 5-1/2 weeks of daily treatment for a total of 30 fractions. We discussed the risks, benefits, short and long-term effects of treatment, the patient is interested in moving forward, and will sign consent on Monday when he comes for simulation on 08/09/2016 at 8:00 AM. 2. Rectal pain secondary to #1. He will receive pain medication refills today from Dr. Burr Medico. We will continue to follow this expectantly.   The above documentation reflects my direct findings during this shared patient visit. Please see the separate note by Dr. Lisbeth Renshaw on this date for the remainder of the patient's plan of care.    Carola Rhine, PAC

## 2016-08-06 NOTE — Telephone Encounter (Signed)
Gave pt cal & avs °

## 2016-08-06 NOTE — Progress Notes (Signed)
Oncology Nurse Navigator Documentation  Oncology Nurse Navigator Flowsheets 08/06/2016  Navigator Location [facility]  Navigator Encounter Type Clinic/MDC  Abnormal Finding Date -  Confirmed Diagnosis Date -  Patient Visit Type MedOnc;RadOnc  Treatment Phase Pre-Tx/Tx Discussion  Barriers/Navigation Needs Coordination of Care;Education  Education Understanding Cancer/ Treatment Options;Pain/ Symptom Management;Newly Diagnosed Cancer Education;Preparing for Upcoming Surgery/ Treatment  Interventions Coordination of Care;Education Method  Coordination of Care Other--message to managed care to expedite PET scan authorization  Education Method Verbal;Written;Teach-back  Support Groups/Services American Cancer Society--Personal Health Manager  Acuity Level 2  Time Spent with Patient 60  Met with patient and significant other, Myriam Jacobson during new patient visit. Explained the role of the GI Nurse Navigator and provided New Patient Packet with information on: 1. Rectal cancer--additional information on CEA test, anatomy of colon/rectum, what to expect at simulation appointment and PET scan procedure 2. Support groups 3. Advanced Directives 4. Fall Safety Plan Answered questions, reviewed current treatment plan using TEACH back and provided emotional support. Provided copy of current treatment plan.  Discussed pain control and needs to keep his stools very soft--continue stool softener and can add Miralax daily also. Call for uncontrolled pain or inability to have BM. Script for Xeloda was given to oral chemotherapy pharmacist to process.  Merceda Elks, RN, BSN GI Oncology [facility]

## 2016-08-06 NOTE — Progress Notes (Signed)
Patient was seen in [facility].  45 year old male diagnosed with rectal cancer to receive XRT and Xeloda.  PMH includes poor dental hygiene, COPD, Tobacco.  Medications include colace.  Labs include Albumin 2.4.  Height: 5'11". Weight: 149.5 pounds 8/18. UBW: 180 pounds July 26, 2016. BMI: 20.85.  Patient was seen in [facility] with wife. Patient reports he has a good appetite and is eating the same as always. He endorses 30 pound weight loss in past 3 weeks. He denies food allergies or intolerances. He has just started drinking boost plus BID.  Nutrition Diagnosis:   Severe Malnutrition related to 17% weight loss over 3 weeks and depletion of fat and muscle mass on physical exam.  Intervention: Educated patient on increasing calories and protein at meals and snacks. Provided fact sheet. Recommend increase Boost Plus QID. Provided coupons. Brief education on eating with diarrhea and provided fact sheet. Questions were answered and teach back method used.  Monitoring, Evaluation, Goals: Patient will increase calories and protein to minimize continued weight loss.  Next Visit: To be scheduled as needed.

## 2016-08-06 NOTE — Progress Notes (Signed)
[facility] Psychosocial Distress Screening Clinical Social Work  Clinical Social Work met with pt and his longtime girlfriend, [name] at [employer] to introduce self, explain role of CSW/Pt and Family Support Team and to review the distress screening protocol.  The patient scored a 7 on the Psychosocial Distress Thermometer which indicates severe distress. Clinical Social Worker met with both pt and his girlfriend to assess for distress and other psychosocial needs. Pt reports he actually feels a little more anxious about his cancer after meeting with his medical team today. CSW reviewed common emotions and how they relate to different points in a cancer journey. CSW also discussed coping techniques and support programs available to assist. Pt reports his anxiety is manageable currently. 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 He is currently on leave from work. His girlfriend reports to be employed and they report finances are "ok" right now. CSW discussed options for assistance, if this becomes an issue.   Pt and girlfriend have a 78 yo son, [name] who attends [employer]. They are concerned on how to talk with their son re. Cancer diagnosis. CSW reviewed developmentally appropriate discussion for their son and also provided handout from [employer] [facility]. Pt and his support agree to reach out as needed and will consider GI group as an additional resource. MD addressed physical symptoms today.   ONCBCN DISTRESS SCREENING 08/06/2016  Screening Type Initial Screening  Distress experienced in past week (1-10) 7  Emotional problem type Nervousness/Anxiety;Adjusting to illness  Physical Problem type Pain;Sleep/insomnia  Physician notified of physical symptoms Yes  Referral to clinical social work Yes  Referral to support programs Yes    Clinical Social Worker follow up needed: No.  If yes, follow up plan: See above Loren Racer,  [place]  [facility] Phone: [phone] Fax: [phone]

## 2016-08-07 ENCOUNTER — Telehealth: Payer: Self-pay | Admitting: Hematology

## 2016-08-07 NOTE — Telephone Encounter (Signed)
APPT CONF WITH PATIENT. 098/19/17

## 2016-08-08 NOTE — Progress Notes (Signed)
  Radiation Oncology         (336) [phone] ________________________________  Name: [name] MRN: [id number]  Date: 08/09/2016  DOB: August 09, 1971   SIMULATION AND TREATMENT PLANNING NOTE  DIAGNOSIS:     ICD-9-CM ICD-10-CM   1. Rectal adenocarcinoma (HCC) 154.1 C20      The patient presented for simulation for the patient's upcoming course of radiation for the diagnosis of rectal cancer. The patient was placed in a supine position. A customized vac-lock bag was constructed to aid in patient immobilization on. This complex treatment device will be used on a daily basis during the treatment. In this fashion a CT scan was obtained through the pelvic region and the isocenter was placed near midline within the pelvis. Surface markings were placed.  The patient's imaging was loaded into the radiation treatment planning system. The patient will initially be planned to receive a course of radiation to a dose of 45 Gy. This will be accomplished in 25 fractions at 1.8 gray per fraction. This initial treatment will correspond to a 3-D conformal technique. The target has been contoured in addition to the rectum, bladder and femoral heads. Dose volume histograms of each of these structures have been requested and these will be carefully reviewed as part of the 3-D conformal treatment planning process. To accomplish this initial treatment, 4 customized blocks have been designed for this purpose. Each of these 4 complex treatment devices will be used on a daily basis during the initial course of the treatment. It is anticipated that the patient will then receive a boost for an additional 5.4 Gy. The anticipated total dose therefore will be 50.4 Gy.    Special treatment procedure The patient will receive chemotherapy during the course of radiation treatment. The patient may experience increased or overlapping toxicity due to this combined-modality approach and the patient will be monitored for such problems. This  may include extra lab work as necessary. This therefore constitutes a special treatment procedure.    ________________________________  Jodelle Gross, MD, PhD

## 2016-08-09 ENCOUNTER — Encounter: Payer: Self-pay | Admitting: Hematology

## 2016-08-09 ENCOUNTER — Other Ambulatory Visit (HOSPITAL_BASED_OUTPATIENT_CLINIC_OR_DEPARTMENT_OTHER): Payer: Medicaid Other

## 2016-08-09 ENCOUNTER — Other Ambulatory Visit: Payer: Medicaid Other

## 2016-08-09 ENCOUNTER — Other Ambulatory Visit: Payer: Self-pay | Admitting: Hematology

## 2016-08-09 ENCOUNTER — Ambulatory Visit
Admission: RE | Admit: 2016-08-09 | Discharge: 2016-08-09 | Disposition: A | Discharge status: Home / Self Care | Payer: Medicaid Other | Source: Ambulatory Visit | Attending: Radiation Oncology | Admitting: Radiation Oncology

## 2016-08-09 DIAGNOSIS — C20 Malignant neoplasm of rectum: Secondary | ICD-10-CM

## 2016-08-09 DIAGNOSIS — Z510 Encounter for antineoplastic radiation therapy: Secondary | ICD-10-CM | POA: Insufficient documentation

## 2016-08-09 LAB — COMPREHENSIVE METABOLIC PANEL
ALBUMIN: 2.2 g/dL — AB (ref 3.5–5.0)
ALT: 9 U/L (ref 0–55)
AST: 12 U/L (ref 5–34)
Alkaline Phosphatase: 62 U/L (ref 40–150)
Anion Gap: 6 mEq/L (ref 3–11)
BUN: 15.1 mg/dL (ref 7.0–26.0)
CHLORIDE: 111 meq/L — AB (ref 98–109)
CO2: 27 mEq/L (ref 22–29)
Calcium: 8.8 mg/dL (ref 8.4–10.4)
Creatinine: 0.9 mg/dL (ref 0.7–1.3)
EGFR: >90 mL/min/{1.73_m2} (ref >90)
GLUCOSE: 97 mg/dL (ref 70–140)
POTASSIUM: 4.3 meq/L (ref 3.5–5.1)
SODIUM: 143 meq/L (ref 136–145)
Total Bilirubin: <0.3 mg/dL (ref 0.20–1.20)
Total Protein: 4.7 g/dL — ABNORMAL LOW (ref 6.4–8.3)

## 2016-08-09 LAB — CBC WITH DIFFERENTIAL/PLATELET
BASO%: 0.5 % (ref 0.0–2.0)
BASOS ABS: 0 10*3/uL (ref 0.0–0.1)
EOS%: 3.3 % (ref 0.0–7.0)
Eosinophils Absolute: 0.3 10*3/uL (ref 0.0–0.5)
HCT: 45.5 % (ref 38.4–49.9)
HEMOGLOBIN: 15.1 g/dL (ref 13.0–17.1)
LYMPH%: 12.9 % — ABNORMAL LOW (ref 14.0–49.0)
MCH: 29.1 pg (ref 27.2–33.4)
MCHC: 33.2 g/dL (ref 32.0–36.0)
MCV: 87.7 fL (ref 79.3–98.0)
MONO#: 0.5 10*3/uL (ref 0.1–0.9)
MONO%: 6.7 % (ref 0.0–14.0)
NEUT#: 6.2 10*3/uL (ref 1.5–6.5)
NEUT%: 76.6 % — ABNORMAL HIGH (ref 39.0–75.0)
Platelets: 227 10*3/uL (ref 140–400)
RBC: 5.19 10*6/uL (ref 4.20–5.82)
RDW: 15.1 % — AB (ref 11.0–14.6)
WBC: 8.1 10*3/uL (ref 4.0–10.3)
lymph#: 1 10*3/uL (ref 0.9–3.3)

## 2016-08-09 LAB — CEA (IN HOUSE-CHCC): CEA (CHCC-In House): 1.52 ng/mL (ref 0.00–5.00)

## 2016-08-09 LAB — TECHNOLOGIST REVIEW

## 2016-08-09 NOTE — Progress Notes (Signed)
nctraks for xeloda prior auth form

## 2016-08-09 NOTE — Progress Notes (Signed)
No form done until verified if he has medicaid?? No card on file.== see prev notes

## 2016-08-09 NOTE — Progress Notes (Signed)
  Radiation Oncology         [phone] ________________________________  Name: [name] MRN: [id number]  Date: 08/09/2016  DOB: 04-02-1971  Optical Surface Tracking Plan:  Since intensity modulated radiotherapy (IMRT) and 3D conformal radiation treatment methods are predicated on accurate and precise positioning for treatment, intrafraction motion monitoring is medically necessary to ensure accurate and safe treatment delivery.  The ability to quantify intrafraction motion without excessive ionizing radiation dose can only be performed with optical surface tracking. Accordingly, surface imaging offers the opportunity to obtain 3D measurements of patient position throughout IMRT and 3D treatments without excessive radiation exposure.  I am ordering optical surface tracking for this patient's upcoming course of radiotherapy. ________________________________  Kyung Rudd, MD 08/09/2016 7:35 PM    Reference:   Particia Jasper, et al. Surface imaging-based analysis of intrafraction motion for breast radiotherapy patients.Journal of [place], n. 6, nov. 2014. ISSN [id number].   Available at: <http://www.jacmp.org/index.php/jacmp/article/view/4957>.

## 2016-08-10 ENCOUNTER — Other Ambulatory Visit: Payer: Self-pay | Admitting: Hematology

## 2016-08-10 ENCOUNTER — Telehealth: Payer: Self-pay | Admitting: *Deleted

## 2016-08-10 MED ORDER — OXYCODONE HCL 5 MG PO TABS: 5.0000 mg | ORAL_TABLET | Freq: Four times a day (QID) | ORAL | 0 refills | Status: DC | PRN

## 2016-08-10 NOTE — Telephone Encounter (Signed)
Oncology Nurse Navigator Documentation  Oncology Nurse Navigator Flowsheets 08/10/2016  Navigator Location [facility]  Navigator Encounter Type Telephone  Telephone Incoming Call;Symptom Mgt--significant other called to request a stronger pain medication for [name]. Vicodin is not keeping his pain controlled  Abnormal Finding Date -  Confirmed Diagnosis Date -  Patient Visit Type -  Treatment Phase -  Barriers/Navigation Needs -  Education -  Interventions Other--forwarded voice mail to collaborative nurse  Coordination of Care Radiology--as of 8/21 afternoon, PET scan authorization is still in review by Medicaid.  Education Method -  Support Groups/Services -  Acuity -  Time Spent with Patient -

## 2016-08-10 NOTE — Telephone Encounter (Signed)
Called, reached at this time with instructions to pick up prescription for his pain.

## 2016-08-10 NOTE — Telephone Encounter (Signed)
Patient's spouse called requesting "something different for pain.  The Hydrocodone-APAP was ordered when he was in the hospital and it's not working anymore.  Pain to lower back above his buttocks.  Described as a sword stuck in a fire someone stuck in him and keeps turning.  Rates pain as eight or nine on pain scale.  after the pain pill rates as a five.  Return number [phone]."  Denies trouble urinating and bowels move daily that are soft, liquid.

## 2016-08-10 NOTE — Telephone Encounter (Signed)
I will give him oxycodone 5-10mg  as needed, please ask pt or his wife to pick up the prescription.   Thanks   Truitt Merle MD

## 2016-08-10 NOTE — Telephone Encounter (Signed)
Called pt and left message on voice mail re:  Script for Oxycodone  5 mg is ready for pt to pick up today before 4 pm.

## 2016-08-11 ENCOUNTER — Telehealth: Payer: Self-pay | Admitting: *Deleted

## 2016-08-11 DIAGNOSIS — Z510 Encounter for antineoplastic radiation therapy: Secondary | ICD-10-CM | POA: Diagnosis not present

## 2016-08-11 NOTE — Telephone Encounter (Signed)
  Oncology Nurse Navigator Documentation  Navigator Location: [facility] (08/11/16 1528) Navigator Encounter Type: Telephone (08/11/16 1528) Telephone: Outgoing Call (08/11/16 1528)   Made his girlfriend, Myriam Jacobson aware that PET was initially denied by Medicaid. MD is working on the appeal. Will still proceed with treatment without the PET. Confirmed she will be picking up the Xeloda this week and copay is $3. Made radiation oncology aware of original denial for scan.

## 2016-08-12 ENCOUNTER — Encounter: Payer: Self-pay | Admitting: Oncology

## 2016-08-12 NOTE — Progress Notes (Signed)
forms left in box- left for dr. Jana Hakim to sign

## 2016-08-16 ENCOUNTER — Telehealth: Payer: Self-pay | Admitting: Pharmacist

## 2016-08-16 ENCOUNTER — Ambulatory Visit
Admission: RE | Admit: 2016-08-16 | Discharge: 2016-08-16 | Disposition: A | Discharge status: Home / Self Care | Payer: Medicaid Other | Source: Ambulatory Visit | Attending: Radiation Oncology | Admitting: Radiation Oncology

## 2016-08-16 ENCOUNTER — Telehealth: Payer: Self-pay | Admitting: Hematology

## 2016-08-16 ENCOUNTER — Encounter: Payer: Self-pay | Admitting: Hematology

## 2016-08-16 ENCOUNTER — Telehealth: Payer: Self-pay | Admitting: *Deleted

## 2016-08-16 ENCOUNTER — Other Ambulatory Visit (HOSPITAL_BASED_OUTPATIENT_CLINIC_OR_DEPARTMENT_OTHER): Payer: Medicaid Other

## 2016-08-16 ENCOUNTER — Ambulatory Visit (HOSPITAL_BASED_OUTPATIENT_CLINIC_OR_DEPARTMENT_OTHER): Payer: Medicaid Other | Admitting: Hematology

## 2016-08-16 VITALS — BP 112/72 | HR 71 | Temp 97.7°F | Resp 18 | Ht 71.0 in | Wt 152.0 lb

## 2016-08-16 DIAGNOSIS — R918 Other nonspecific abnormal finding of lung field: Secondary | ICD-10-CM | POA: Diagnosis not present

## 2016-08-16 DIAGNOSIS — K6289 Other specified diseases of anus and rectum: Secondary | ICD-10-CM

## 2016-08-16 DIAGNOSIS — C20 Malignant neoplasm of rectum: Secondary | ICD-10-CM

## 2016-08-16 DIAGNOSIS — F411 Generalized anxiety disorder: Secondary | ICD-10-CM

## 2016-08-16 DIAGNOSIS — Z510 Encounter for antineoplastic radiation therapy: Secondary | ICD-10-CM | POA: Diagnosis not present

## 2016-08-16 LAB — COMPREHENSIVE METABOLIC PANEL
ALBUMIN: 2.3 g/dL — AB (ref 3.5–5.0)
ALK PHOS: 59 U/L (ref 40–150)
ALT: 9 U/L (ref 0–55)
AST: 14 U/L (ref 5–34)
Anion Gap: 9 mEq/L (ref 3–11)
BUN: 18.8 mg/dL (ref 7.0–26.0)
CO2: 27 meq/L (ref 22–29)
Calcium: 8.9 mg/dL (ref 8.4–10.4)
Chloride: 108 mEq/L (ref 98–109)
Creatinine: 0.8 mg/dL (ref 0.7–1.3)
EGFR: >90 mL/min/{1.73_m2} (ref >90)
GLUCOSE: 96 mg/dL (ref 70–140)
POTASSIUM: 3.8 meq/L (ref 3.5–5.1)
SODIUM: 143 meq/L (ref 136–145)
TOTAL PROTEIN: 4.8 g/dL — AB (ref 6.4–8.3)

## 2016-08-16 LAB — CBC WITH DIFFERENTIAL/PLATELET
BASO%: 0.7 % (ref 0.0–2.0)
Basophils Absolute: 0.1 10*3/uL (ref 0.0–0.1)
EOS%: 3.6 % (ref 0.0–7.0)
Eosinophils Absolute: 0.3 10*3/uL (ref 0.0–0.5)
HCT: 46.2 % (ref 38.4–49.9)
HEMOGLOBIN: 15.1 g/dL (ref 13.0–17.1)
LYMPH%: 13.6 % — ABNORMAL LOW (ref 14.0–49.0)
MCH: 28.5 pg (ref 27.2–33.4)
MCHC: 32.6 g/dL (ref 32.0–36.0)
MCV: 87.5 fL (ref 79.3–98.0)
MONO#: 0.6 10*3/uL (ref 0.1–0.9)
MONO%: 6.9 % (ref 0.0–14.0)
NEUT%: 75.2 % — ABNORMAL HIGH (ref 39.0–75.0)
NEUTROS ABS: 6.6 10*3/uL — AB (ref 1.5–6.5)
Platelets: 213 10*3/uL (ref 140–400)
RBC: 5.28 10*6/uL (ref 4.20–5.82)
RDW: 15.4 % — ABNORMAL HIGH (ref 11.0–14.6)
WBC: 8.8 10*3/uL (ref 4.0–10.3)
lymph#: 1.2 10*3/uL (ref 0.9–3.3)

## 2016-08-16 MED ORDER — ALPRAZOLAM 0.5 MG PO TABS: 0.5000 mg | ORAL_TABLET | Freq: Every evening | ORAL | 0 refills | Status: DC | PRN

## 2016-08-16 MED ORDER — PROCHLORPERAZINE MALEATE 10 MG PO TABS: 10.0000 mg | ORAL_TABLET | Freq: Four times a day (QID) | ORAL | 2 refills | Status: DC | PRN

## 2016-08-16 MED ORDER — OXYCODONE HCL 5 MG PO TABS: 5.0000 mg | ORAL_TABLET | Freq: Four times a day (QID) | ORAL | 0 refills | Status: DC | PRN

## 2016-08-16 NOTE — Telephone Encounter (Signed)
Oral Chemotherapy Pharmacist Encounter  I spoke with patient for overview of new oral chemotherapy medication: Xeloda. Pt is doing well.   Counseled patient on administration, dosing, side effects, safe handling, and monitoring. Side effects include but not limited to: N/V/D, fatigue, hand-foot syndrome.  He voiced understanding and appreciation.   All questions answered. Provided Oral Chemotherapy Clinic phone number to patient to call with any concerns.  Will follow up with patient regarding insurance and pharmacy. Will follow up in 2 weeks for adherence and toxicity management.   Thank you,  Nuala Alpha, PharmD Oral Chemotherapy Clinic [phone]

## 2016-08-16 NOTE — Progress Notes (Signed)
[name]  [phone] Fax:(336) [phone]  Clinic follow Up Note   Patient Care Team: [name] as PCP - General (General Practice) 08/16/2016  CHIEF COMPLAINTS:  Follow up rectal cancer  Oncology History   Rectal adenocarcinoma [facility])   Staging form: Colon and Rectum, AJCC 7th Edition   - Clinical stage from 07/28/2016: Stage IIIB (T3, N2a, M0) - Signed by Truitt Merle, MD on 08/06/2016      Rectal adenocarcinoma ([place])   07/26/2016 Imaging    CT chest, abdomen and pelvis with contrast showed nodular thickening of the rectal wall, enlarged perirectal lymph nodes. Diverticulosis. Nonspecific low-level lymphadenopathy. Lower lobe emphysema, small 3-4 mm left lower lobe lung nodules.      07/28/2016 Initial Diagnosis    Rectal adenocarcinoma ([place])      07/28/2016 Initial Biopsy    Rectal mass biopsy showed invasive adenocarcinoma, polyps in the ascending colon and rectum showed tubular adenoma       07/28/2016 Procedure    Colonoscopy showed a nearly circumferential mass in the distal rectum, 4 polyps in the descending colon, diverticulosis in the left colon.       07/29/2016 Procedure    Low EUS showed clearly malignant 6 cm long, nearly circumferential mass in the distal rectum, with distal edge located to 1 cm from the annual approach, multiple prior rectal lymph nodes (6) are suspicious ,uT3 N2        HISTORY OF PRESENTING ILLNESS:  [name] 45 y.o. male is here because of His newly diagnosed rectal cancer. He is accompanied by his girlfriend to our multidisciplinary GI clinic today.  He has had rectal pain for 6 weeks, and mild rectal bleeding with BM, he goes 4 times a day, very little each time, and pencil shaped stool, he has lost 30 lbs so far. His appettie remains to be good, he eats well, no cough, nausea, no abd pain. He has a moderate fatigue, he will 2 tolerate his routine activities including his job without much difficulty. He does  not have a primary care physician, and has not seen doctors for many years.  He presents to emergency room on 07/26/2016 for the rectal pain and bleeding, and was referred to gastroenterologist Dr. Loletha Carrow. He underwent colonoscopy on 07/28/2016, which showed 4 polyps in the ascending colon, and a circumferential distal rectal mass. Biopsy of the rectal mass showed adenocarcinoma. He underwent EUS by Dr. Ardis Hughs on 07/29/2016 which showed a T3 N2 disease.   CURRENT THERAPY: Concurrent chemoradiation with Xeloda 1500 mg twice daily, starting on 08/17/2016  INTERIM HISTORY  [name] returns for follow-up and CT simulation. He still has moderate rectal pain, he takes oxycodone every 6 hours, not well controlled, he sleeps on of the night due to the pain. He also feels nervous and anxious, no other new complaints. No rectal bleeding lately. His bowel movement is slightly loose.  MEDICAL HISTORY:  Past Medical History:  Diagnosis Date  . Poor dental hygiene        SURGICAL HISTORY: Past Surgical History:  Procedure Laterality Date  . EUS N/A 07/29/2016   Procedure: LOWER ENDOSCOPIC ULTRASOUND (EUS);  Surgeon: Milus Banister, MD;  Location: [facility];  Service: Endoscopy;  Laterality: N/A;  . TOE AMPUTATION Left     SOCIAL HISTORY: Social History   Social History  . Marital status: Divorced    Spouse name: N/A  . Number of children: 1  . Years of education:  N/A   Occupational History  . Not on file.   Social History Main Topics  . Smoking status: Current Every Day Smoker    Packs/day: 1.50    Years: 30.00  . Smokeless tobacco: Never Used  . Alcohol use No  . Drug use:     Types: Marijuana     Comment: patient denies  . Sexual activity: Not on file   Other Topics Concern  . Not on file   Social History Narrative   Lives with significant other, Illene [name]   Have a 63 year old son   Smoker    FAMILY HISTORY: Family History  Problem Relation Age of Onset  .  Diabetes Mother   . COPD Father   . Pancreatic cancer Neg Hx   . Esophageal cancer Neg Hx   . Stomach cancer Neg Hx   . Colon cancer Neg Hx   . Liver disease Neg Hx    He lives with his girfriend the their 75 yo son, works for [employer]   ALLERGIES:  has No Known Allergies.  MEDICATIONS:  Current Outpatient Prescriptions  Medication Sig Dispense Refill  . capecitabine (XELODA) 500 MG tablet Take 3 tablets (1,500 mg total) by mouth 2 (two) times daily after a meal. 180 tablet 0  . docusate sodium (COLACE) 100 MG capsule Take 1 capsule (100 mg total) by mouth every 12 (twelve) hours. 60 capsule 0  . ibuprofen (ADVIL,MOTRIN) 200 MG tablet Take 400 mg by mouth every 6 (six) hours as needed for headache or moderate pain.    Marland Kitchen oxyCODONE (OXY IR/ROXICODONE) 5 MG immediate release tablet Take 1-2 tablets (5-10 mg total) by mouth every 6 (six) hours as needed for severe pain. 90 tablet 0  . ALPRAZolam (XANAX) 0.5 MG tablet Take 1 tablet (0.5 mg total) by mouth at bedtime as needed for anxiety. 20 tablet 0  . prochlorperazine (COMPAZINE) 10 MG tablet Take 1 tablet (10 mg total) by mouth every 6 (six) hours as needed for nausea or vomiting. 30 tablet 2   Current Facility-Administered Medications  Medication Dose Route Frequency Provider Last Rate Last Dose  . 0.9 %  sodium chloride infusion  500 mL Intravenous Continuous Charlie Pitter III, MD        REVIEW OF SYSTEMS:   Constitutional: Denies fevers, chills or abnormal night sweats Eyes: Denies blurriness of vision, double vision or watery eyes Ears, nose, mouth, throat, and face: Denies mucositis or sore throat Respiratory: Denies cough, dyspnea or wheezes Cardiovascular: Denies palpitation, chest discomfort or lower extremity swelling Gastrointestinal:  Denies nausea, heartburn or change in bowel habits Skin: Denies abnormal skin rashes Lymphatics: Denies new lymphadenopathy or easy bruising Neurological:Denies numbness, tingling or new  weaknesses Behavioral/Psych: Mood is stable, no new changes  All other systems were reviewed with the patient and are negative.  PHYSICAL EXAMINATION: ECOG PERFORMANCE STATUS: 1 - Symptomatic but completely ambulatory  Vitals:   08/16/16 0817  BP: 112/72  Pulse: 71  Resp: 18  Temp: 97.7 F (36.5 C)   Filed Weights   08/16/16 0817  Weight: 152 lb (68.9 kg)    GENERAL:alert, no distress and comfortable SKIN: skin color, texture, turgor are normal, no rashes or significant lesions EYES: normal, conjunctiva are pink and non-injected, sclera clear OROPHARYNX:no exudate, no erythema and lips, buccal mucosa, and tongue normal  NECK: supple, thyroid normal size, non-tender, without nodularity LYMPH:  no palpable lymphadenopathy in the cervical, axillary or inguinal LUNGS: clear to auscultation and  percussion with normal breathing effort HEART: regular rate & rhythm and no murmurs and no lower extremity edema ABDOMEN:abdomen soft, non-tender and normal bowel sounds. Rectal exam showed a circumferential distal rectal mass, no blood on glove. Musculoskeletal:no cyanosis of digits and no clubbing  PSYCH: alert & oriented x 3 with fluent speech NEURO: no focal motor/sensory deficits  LABORATORY DATA:  I have reviewed the data as listed CBC Latest Ref Rng & Units 08/16/2016 08/09/2016 07/26/2016  WBC 4.0 - 10.3 10e3/uL 8.8 8.1 7.8  Hemoglobin 13.0 - 17.1 g/dL 15.1 15.1 15.9  Hematocrit 38.4 - 49.9 % 46.2 45.5 48.4  Platelets 140 - 400 10e3/uL 213 227 257   CMP Latest Ref Rng & Units 08/09/2016 07/26/2016  Glucose 70 - 140 mg/dl 97 79  BUN 7.0 - 26.0 mg/dL 15.1 7  Creatinine 0.7 - 1.3 mg/dL 0.9 1.02  Sodium 136 - 145 mEq/L 143 138  Potassium 3.5 - 5.1 mEq/L 4.3 4.0  Chloride 101 - 111 mmol/L - 108  CO2 22 - 29 mEq/L 27 26  Calcium 8.4 - 10.4 mg/dL 8.8 8.1(L)  Total Protein 6.4 - 8.3 g/dL 4.7(L) 4.4(L)  Total Bilirubin 0.20 - 1.20 mg/dL <0.30 0.5  Alkaline Phos 40 - 150 U/L 62 55  AST 5  - 34 U/L 12 16  ALT 0 - 55 U/L 9 12(L)   Pathology report  Diagnosis 07/28/2016 1. Surgical [P], descending, polyps(2) -TUBULAR ADENOMAS. -NO HIGH GRADE DYSPLASIA OR MALIGNANCY IDENTIFIED. 2. Surgical [P], rectal mass -INVASIVE ADENOCARCINOMA. -SEE COMMENT. 3. Surgical [P], rectum, polyps(2) -TUBULAR ADENOMAS. -NO HIGH GRADE DYSPLASIA OR MALIGNANCY IDENTIFIED. Microscopic Comment 2. Internal departmental review obtained (Dr. Saralyn Pilar) with agreement. Results are phoned to [facility], nurse in the office of Dr. Loletha Carrow. (MEG 08/03/16)  RADIOGRAPHIC STUDIES: I have personally reviewed the radiological images as listed and agreed with the findings in the report. Ct Chest W Contrast  Result Date: 08/02/2016 CLINICAL DATA:  Newly diagnosed rectal mass. Evaluate for lung metastasis. EXAM: CT CHEST WITH CONTRAST TECHNIQUE: Multidetector CT imaging of the chest was performed during intravenous contrast administration. CONTRAST:  61mL ISOVUE-300 IOPAMIDOL (ISOVUE-300) INJECTION 61% COMPARISON:  CT abdomen pelvis 07/26/2016 FINDINGS: Cardiovascular: Unremarkable Mediastinum/Nodes: Prominent RIGHT axial lymph nodes are not pathologic by size criteria measuring 10 mm short axis. RIGHT low paratracheal lymph node measures 10 mm short axis. RIGHT paratracheal node measures 9 mm short axis (image 39, series 2). Lungs/Pleura: Centrilobular emphysema in the upper lobes. Subpleural nodule within the superior aspect of the RIGHT lower lobe measures 3 mm (image 35, series 3). Subpleural nodule in the RIGHT lower lobe measures 4 mm image 53, series 3). Nodule in the superior aspect of the LEFT oblique fissure measures 5 mm (image 34, series 3). Parenchymal nodule in the LEFT upper lobe measures 4 mm (image 35, series 3). Parenchymal nodule in the LEFT lower lobe measures 4 mm on image 71, series 3 Upper Abdomen: Limited view of the liver, kidneys, pancreas are unremarkable. Normal adrenal glands. Musculoskeletal: No  aggressive osseous lesion IMPRESSION: 1. Bilateral pulmonary nodules in patient with rectal carcinoma is a concerning finding for pulmonary metastasis. While these nodules are at the lower limits of accurate PET characterization, would still consider an FDG PET scan for evaluation. 2. Borderline mediastinal adenopathy differential including reactive adenopathy related emphysema versus metastatic rectal carcinoma. Electronically Signed   By: Suzy Bouchard M.D.   On: 08/02/2016 15:27   Ct Abdomen Pelvis W Contrast  Result Date: 07/26/2016 CLINICAL DATA:  45 year old male with intermittent diffuse abdominal pain and low back pain for 1 month. Initial encounter. EXAM: CT ABDOMEN AND PELVIS WITH CONTRAST TECHNIQUE: Multidetector CT imaging of the abdomen and pelvis was performed using the standard protocol following bolus administration of intravenous contrast. CONTRAST:  116mL ISOVUE-300 IOPAMIDOL (ISOVUE-300) INJECTION 61% COMPARISON:  Chest radiographs 02/15/2007. FINDINGS: Centrilobular and paraseptal emphysema at the lung bases. Up to mild lower lobe peribronchial thickening. 3-4 mm left lower lobe pulmonary nodule on series 3, image 3. Similar small nodule along the left major fissure on image 4. No pericardial or pleural effusion. Chronic bilateral L5 pars fractures. Trace associated grade 1 anterolisthesis at L5-S1. No associated spinal stenosis. Incidental S1 spina bifida occulta (normal variant). No acute osseous abnormality identified. No pelvic free fluid. However, there is mild thickening and irregularity of the mid to distal rectal wall as seen on series 2, image 78. Up to 12 mm lobular areas of wall thickening suggested with hyper enhancement. No perirectal stranding. See coronal image 84. Proximal rectum has a more normal appearance. The sigmoid colon is redundant with moderate diverticulosis, but no definite active inflammation. Suggestion of wall thickening at the proximal sigmoid and distal  descending colon may be artifact due to under distension. The proximal descending colon has a more normal appearance. There are occasional diverticula there are and at the splenic flexure with no active inflammation identified. Negative transverse colon, right colon and appendix. Negative terminal ileum. No dilated small bowel. Questionable mild small bowel mural hyper enhancement, with similar findings at the stomach and duodenum which otherwise appear negative. No abdominal free fluid or free air. Liver, gallbladder, pancreas, adrenal glands, and kidneys appear normal. Negative urinary bladder. The spleen is normal aside from a circumscribed cyst with simple fluid density at the superior pole measuring 12 mm. This abuts the capsule and appears benign (series 2, image 11). Portal venous system is patent. Major arterial structures appear normal. Mildly increased in number lymph nodes throughout the abdomen and pelvis, however most remain normal by size criteria. Of note, there are rounded perirectal lymph nodes up to 8-9 mm (coronal image 106). No pelvic sidewall lymphadenopathy. IMPRESSION: 1. Nodular or thickening and hyper enhancement of the rectal wall with maximal to mildly enlarged and rounded perirectal lymph nodes. This could be inflammatory (proctitis), but the appearance is suspicious for Rectal Carcinoma. GI consultation and Endoscopy recommended. 2. Diverticulosis of the left and sigmoid colon without active inflammation, but questionable sigmoid wall thickening. Questionable small bowel and stomach mural hyper-enhancement which raises the possibility of an infectious or inflammatory gastroenteritis. The terminal ileum appears spared. 3. Nonspecific low level lymphadenopathy elsewhere in the abdomen and pelvis. 4. Lower lobe emphysema and to small 3-4 mm left lower lobe lung nodules. No follow-up needed if patient is low-risk (and has no known or suspected primary neoplasm). Non-contrast chest CT can be  considered in 12 months if patient is high-risk. This recommendation follows the consensus statement: Guidelines for Management of Incidental Pulmonary Nodules Detected on CT Images:From the Fleischner Society 2017; published online before print (10.1148/radiol.IJ:2314499). 5. 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 Electronically Signed   By: Genevie Ann M.D.   On: 07/26/2016 11:22   EUS 07/29/2016 -Clearly malignant 6cm long, nearly circumferential mass in the distal rectum with distal edge located 1cm from the anal verge. If this is rectal adenocarcinoma it is uT3N2a, Stage IIIa.  COLONOSCOPY 07/28/2016 - Rectal mass. - Four 4 mm polyps in the rectum and  in the descending colon, removed with a cold snare. Resected and retrieved. - Diverticulosis in the left colon. - Likely malignant tumor in the distal rectum. Biopsied. Tattooed.   ASSESSMENT & PLAN: 45 year old male without significant past medical history presented with rectal pain and bleeding.  1. 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 He also has a slightly prominent periaortic lymph node, about 8 mm. -We recommend him to have a PET scan to further evaluate distant metastasis, but the scan was denied by his insurance -Reviewed the nature history of rectal cancer, and high-risk of cancer recurrence after surgery, even if this is stage III disease. If he does have pulmonary metastasis, then his disease is not curable -Due to his significant symptoms from his rectal cancer, regardless if he has metastatic disease, I think concurrent chemoradiation would be the best first treatment option for  now, both for symptom management, or curative intent if no definitive evidence of metastasis.  -His Xeloda has been approved by his insurance with a minimum co-pay, he is starting concurrent chemoradiation today. -Chemotherapy side effect and precautions were reviewed with patient and his cough and again, he voiced good understanding. -I called in Compazine for nausea as needed  2. Rectal pain  -He is taking oxycodone 5 mg every 6 hours, pain is not very well controlled -I refilled his oxycodone, he will take 1-2 tablets every 4 hours as needed. -If he takes oxycodone more than 30 mg a day, I would add on MS Contin  3. Anxiety  -He feels nervous and anxious since her cancer diagnosis -I give him a prescription of Xanax 0.5 mg every 6 hours as needed, he is aware no driving after medication  4. Social issues -He is out of work, he wants to apply for Medicaid and disability -He was seen by our Education officer, museum  Plan -start concurrent chemoradiation with Xeloda 1500 mg twice daily today -lab weekly  -I'll see him back next Wednesday for follow-up  All questions were answered. The patient knows to call the clinic with any problems, questions or concerns. I spent 25 minutes counseling the patient face to face. The total time spent in the appointment was 30 minutes and more than 50% was on counseling.     Truitt Merle, MD 08/16/2016 8:56 AM

## 2016-08-16 NOTE — Telephone Encounter (Signed)
Gave patient avs report and appointments for September.  °

## 2016-08-16 NOTE — Telephone Encounter (Signed)
Dr. Burr Medico completed peer-to-peer review with [employer] and PET has been approved with Josem Kaufmann [id number]. Called radiology department to schedule: was informed that PET scan schedulers are gone for the day. Left VM for Manuela Schwartz to call back first thing in morning to schedule 1st available (urgent) and patient willing to go to [place] to have scan soon.

## 2016-08-17 ENCOUNTER — Telehealth: Payer: Self-pay | Admitting: *Deleted

## 2016-08-17 ENCOUNTER — Ambulatory Visit
Admission: RE | Admit: 2016-08-17 | Discharge: 2016-08-17 | Disposition: A | Discharge status: Home / Self Care | Payer: Medicaid Other | Source: Ambulatory Visit | Attending: Radiation Oncology | Admitting: Radiation Oncology

## 2016-08-17 ENCOUNTER — Encounter: Payer: Self-pay | Admitting: *Deleted

## 2016-08-17 DIAGNOSIS — Z510 Encounter for antineoplastic radiation therapy: Secondary | ICD-10-CM | POA: Diagnosis not present

## 2016-08-17 NOTE — Telephone Encounter (Signed)
Oncology Nurse Navigator Documentation  Oncology Nurse Navigator Flowsheets 08/17/2016  Navigator Location [facility]  Navigator Encounter Type Telephone  Telephone Outgoing Call;Appt Confirmation/Clarification  Abnormal Finding Date -  Confirmed Diagnosis Date -  Patient Visit Type -  Treatment Phase -  Barriers/Navigation Needs Coordination of Care--PET scan  Education -  Interventions Coordination of Care--left PET appointment and instructions in writing and gave to tech at [facility] to give to him today when he arrives for treatment.  Coordination of Care Radiology--PET at [facility] 08/18/16 at 0700/0730  Education Method Written--attempted to call X 2 with no answer or voice mail option  Support Groups/Services -  Acuity -  Time Spent with Patient 15

## 2016-08-17 NOTE — Telephone Encounter (Signed)
"  PET Scan scheduled tomorrow morning.  Xeloda has to be taken the same time every day.  Can he take the cancer medication as usual at 6:15 tomorrow morning."  Advised he take Xeloda after a meal.  PET should be completed by 8:00 and he can eat after PET and then take xeloda.

## 2016-08-17 NOTE — Progress Notes (Signed)
[place] Work  Clinical Social Work was referred by Futures trader for support applying for social security disability and assessment of psychosocial needs.  Clinical Social Worker contacted patient at home to offer support and assess for needs.  CSw spoke to patients wife who stated patient had an appointment with SSD tomorrow at 11am.  Patients wife also stated patient had already been approved for Medicaid.  CSW and patients wife discussed the SSD application process.  CSW encouraged patient to call Social Security to determine what information she needed to bring with her (medical records, work history, ect.Marland Kitchen).  Patients wife plans to call SSD and will contact CSW with any additional questions or concerns.          Johnnye Lana, MSW, LCSW, OSW-C Clinical Social Worker [facility] [phone]

## 2016-08-18 ENCOUNTER — Ambulatory Visit
Admission: RE | Admit: 2016-08-18 | Discharge: 2016-08-18 | Disposition: A | Discharge status: Home / Self Care | Payer: Medicaid Other | Source: Ambulatory Visit | Attending: Radiation Oncology | Admitting: Radiation Oncology

## 2016-08-18 ENCOUNTER — Encounter
Admission: RE | Admit: 2016-08-18 | Discharge: 2016-08-18 | Disposition: A | Discharge status: Home / Self Care | Payer: Medicaid Other | Source: Ambulatory Visit | Attending: Radiation Oncology | Admitting: Radiation Oncology

## 2016-08-18 DIAGNOSIS — C20 Malignant neoplasm of rectum: Secondary | ICD-10-CM

## 2016-08-18 DIAGNOSIS — Z510 Encounter for antineoplastic radiation therapy: Secondary | ICD-10-CM | POA: Diagnosis not present

## 2016-08-18 LAB — GLUCOSE, CAPILLARY: GLUCOSE-CAPILLARY: 80 mg/dL (ref 65–99)

## 2016-08-18 MED ORDER — FLUDEOXYGLUCOSE F - 18 (FDG) INJECTION
12.0000 | Freq: Once | INTRAVENOUS | Status: AC | PRN
  Administered 2016-08-18: 12.17 via INTRAVENOUS

## 2016-08-19 ENCOUNTER — Ambulatory Visit
Admission: RE | Admit: 2016-08-19 | Discharge: 2016-08-19 | Disposition: A | Discharge status: Home / Self Care | Payer: Medicaid Other | Source: Ambulatory Visit | Attending: Radiation Oncology | Admitting: Radiation Oncology

## 2016-08-19 ENCOUNTER — Ambulatory Visit: Payer: Medicaid Other | Attending: Radiation Oncology

## 2016-08-19 ENCOUNTER — Encounter: Payer: Self-pay | Admitting: *Deleted

## 2016-08-19 DIAGNOSIS — Z510 Encounter for antineoplastic radiation therapy: Secondary | ICD-10-CM | POA: Diagnosis not present

## 2016-08-19 DIAGNOSIS — C20 Malignant neoplasm of rectum: Secondary | ICD-10-CM

## 2016-08-19 NOTE — Progress Notes (Signed)
Oncology Nurse Navigator Documentation  Oncology Nurse Navigator Flowsheets 08/19/2016  Navigator Location [facility]  Navigator Encounter Type Letter/Fax/Email  Telephone -  Abnormal Finding Date -  Confirmed Diagnosis Date -  Treatment Initiated Date 08/17/2016  Patient Visit Type -  Treatment Phase Active Tx--RT/Xeloda  Barriers/Navigation Needs Coordination of Care--General surgery consult  Education -  Interventions Referrals--referral sent to [facility] for Dr. Johney Maine  Referrals Other-  Coordination of Care -  Education Method -  Support Groups/Services -  Acuity Level 1  Time Spent with Patient 15

## 2016-08-20 ENCOUNTER — Ambulatory Visit
Admission: RE | Admit: 2016-08-20 | Discharge: 2016-08-20 | Disposition: A | Discharge status: Home / Self Care | Payer: Medicaid Other | Source: Ambulatory Visit | Attending: Radiation Oncology | Admitting: Radiation Oncology

## 2016-08-20 ENCOUNTER — Encounter: Payer: Self-pay | Admitting: Radiation Oncology

## 2016-08-20 VITALS — BP 105/72 | HR 82 | Temp 98.0°F | Resp 18 | Wt 146.9 lb

## 2016-08-20 DIAGNOSIS — C20 Malignant neoplasm of rectum: Secondary | ICD-10-CM

## 2016-08-20 DIAGNOSIS — Z510 Encounter for antineoplastic radiation therapy: Secondary | ICD-10-CM | POA: Diagnosis not present

## 2016-08-20 NOTE — Progress Notes (Signed)
Department of Radiation Oncology  Phone:  [phone] Fax:        [phone]  Weekly Treatment Note    Name: [name] Date: 08/20/2016 MRN: [id number] DOB: 1971/01/17   Diagnosis:     ICD-9-CM ICD-10-CM   1. 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 ALPRAZolam (XANAX) 0.5 MG tablet Take 1 tablet (0.5 mg total) by mouth at bedtime as needed for anxiety. 20 tablet 0  . capecitabine (XELODA) 500 MG tablet Take 3 tablets (1,500 mg total) by mouth 2 (two) times daily after a meal. 180 tablet 0  . docusate sodium (COLACE) 100 MG capsule Take 1 capsule (100 mg total) by mouth every 12 (twelve) hours. 60 capsule 0  . ibuprofen (ADVIL,MOTRIN) 200 MG tablet Take 400 mg by mouth every 6 (six) hours as needed for headache or moderate pain.    Marland Kitchen oxyCODONE (OXY IR/ROXICODONE) 5 MG immediate release tablet Take 1-2 tablets (5-10 mg total) by mouth every 6 (six) hours as needed for severe pain. 90 tablet 0  . prochlorperazine (COMPAZINE) 10 MG tablet Take 1 tablet (10 mg total) by mouth every 6 (six) hours as needed for nausea or vomiting. 30 tablet 2   Current Facility-Administered Medications  Medication Dose Route Frequency Provider Last Rate Last Dose  . 0.9 %  sodium chloride infusion  500 mL Intravenous Continuous Nelida Meuse III, MD         ALLERGIES: Review of patient's allergies indicates no known allergies.   LABORATORY DATA:  Lab Results  Component Value Date   WBC 8.8 08/16/2016   HGB 15.1 08/16/2016   HCT 46.2 08/16/2016   MCV 87.5 08/16/2016   PLT 213 08/16/2016   Lab Results  Component Value Date   NA 143 08/16/2016   K 3.8 08/16/2016   CL 108 07/26/2016   CO2 27 08/16/2016   Lab Results  Component Value Date   ALT 9 08/16/2016   AST 14 08/16/2016   ALKPHOS 59 08/16/2016   BILITOT <0.30 08/16/2016     NARRATIVE: [name] was seen today for weekly treatment management. The chart was checked and the patient's films were reviewed.  Weekly rad txs 4/25 completed rectal , no pain, still smokes 1.5ppd, has congestive smokers cough, no c/o pain , appetite fair,  Pt education done rectal cancer, radiation therapy and you book, my business card given , discussed ways to manage side effects, nausea, vomiting, diarrhea, skin irritation, fatigue, pain, use of baby wipes, may need to eat a low fiber diet for diarrhea, no fresh veg or fruits at that time, may need to eat 5-6 smaller meals and snacks in between for nausea, , gave sitz bath for rectal irritation, verbal understanding, teach back given BP 105/72 (BP Location: Left Arm, Patient Position: Sitting, Cuff Size: Normal)   Pulse 82   Temp 98 F (36.7 C) (Oral)   Resp 18   Wt 146 lb 14.4 oz (66.6 kg)   BMI 20.49 kg/m   Wt Readings from Last 3 Encounters:  08/20/16 146 lb 14.4 oz (66.6 kg)  08/16/16 152 lb (68.9 kg)  08/06/16 149 lb 8 oz (67.8 kg)    PHYSICAL EXAMINATION: weight is 146 lb 14.4 oz (66.6 kg). His oral temperature is 98 F (36.7 C). His blood pressure is 105/72 and his pulse is  82. His respiration is 18.        ASSESSMENT: The patient is doing satisfactorily with treatment.  PLAN: We will continue with the patient's radiation treatment as planned. I discussed the patient's PET scan with him today. This was a good result although we did discuss that some pulmonary nodules may be below the level of protection for PET scan. Nevertheless, there was not clear evidence of metastatic disease which is very pleased with.

## 2016-08-20 NOTE — Progress Notes (Signed)
Weekly rad txs 4/25 completed rectal , no pain, still smokes 1.5ppd, has congestive smokers cough, no c/o pain , appetite fair,  Pt education done rectal cancer, radiation therapy and you book, my business card given , discussed ways to manage side effects, nausea, vomiting, diarrhea, skin irritation, fatigue, pain, use of baby wipes, may need to eat a low fiber diet for diarrhea, no fresh veg or fruits at that time, may need to eat 5-6 smaller meals and snacks in between for nausea, , gave sitz bath for rectal irritation, verbal understanding, teach back given BP 105/72 (BP Location: Left Arm, Patient Position: Sitting, Cuff Size: Normal)   Pulse 82   Temp 98 F (36.7 C) (Oral)   Resp 18   Wt 146 lb 14.4 oz (66.6 kg)   BMI 20.49 kg/m   Wt Readings from Last 3 Encounters:  08/20/16 146 lb 14.4 oz (66.6 kg)  08/16/16 152 lb (68.9 kg)  08/06/16 149 lb 8 oz (67.8 kg)

## 2016-08-24 ENCOUNTER — Ambulatory Visit
Admission: RE | Admit: 2016-08-24 | Discharge: 2016-08-24 | Disposition: A | Discharge status: Home / Self Care | Payer: Medicaid Other | Source: Ambulatory Visit | Attending: Radiation Oncology | Admitting: Radiation Oncology

## 2016-08-24 ENCOUNTER — Other Ambulatory Visit (HOSPITAL_BASED_OUTPATIENT_CLINIC_OR_DEPARTMENT_OTHER): Payer: Medicaid Other

## 2016-08-24 DIAGNOSIS — C20 Malignant neoplasm of rectum: Secondary | ICD-10-CM

## 2016-08-24 DIAGNOSIS — Z510 Encounter for antineoplastic radiation therapy: Secondary | ICD-10-CM | POA: Diagnosis not present

## 2016-08-24 LAB — COMPREHENSIVE METABOLIC PANEL
ALBUMIN: 2.2 g/dL — AB (ref 3.5–5.0)
ALK PHOS: 56 U/L (ref 40–150)
ALT: 14 U/L (ref 0–55)
ANION GAP: 5 meq/L (ref 3–11)
AST: 24 U/L (ref 5–34)
BUN: 10 mg/dL (ref 7.0–26.0)
CALCIUM: 8.6 mg/dL (ref 8.4–10.4)
CO2: 24 meq/L (ref 22–29)
CREATININE: 0.7 mg/dL (ref 0.7–1.3)
Chloride: 112 mEq/L — ABNORMAL HIGH (ref 98–109)
EGFR: >90 mL/min/{1.73_m2} (ref >90)
Glucose: 98 mg/dl (ref 70–140)
Potassium: 3.8 mEq/L (ref 3.5–5.1)
Sodium: 141 mEq/L (ref 136–145)
TOTAL PROTEIN: 4.5 g/dL — AB (ref 6.4–8.3)

## 2016-08-24 LAB — CBC WITH DIFFERENTIAL/PLATELET
BASO%: 0.8 % (ref 0.0–2.0)
Basophils Absolute: 0 10*3/uL (ref 0.0–0.1)
EOS ABS: 0.2 10*3/uL (ref 0.0–0.5)
EOS%: 2.9 % (ref 0.0–7.0)
HEMATOCRIT: 42 % (ref 38.4–49.9)
HEMOGLOBIN: 13.8 g/dL (ref 13.0–17.1)
LYMPH#: 0.8 10*3/uL — AB (ref 0.9–3.3)
LYMPH%: 12.2 % — ABNORMAL LOW (ref 14.0–49.0)
MCH: 28.5 pg (ref 27.2–33.4)
MCHC: 32.8 g/dL (ref 32.0–36.0)
MCV: 86.9 fL (ref 79.3–98.0)
MONO#: 0.4 10*3/uL (ref 0.1–0.9)
MONO%: 5.9 % (ref 0.0–14.0)
NEUT%: 78.2 % — ABNORMAL HIGH (ref 39.0–75.0)
NEUTROS ABS: 4.9 10*3/uL (ref 1.5–6.5)
PLATELETS: 208 10*3/uL (ref 140–400)
RBC: 4.84 10*6/uL (ref 4.20–5.82)
RDW: 15.2 % — AB (ref 11.0–14.6)
WBC: 6.3 10*3/uL (ref 4.0–10.3)

## 2016-08-25 ENCOUNTER — Ambulatory Visit
Admission: RE | Admit: 2016-08-25 | Discharge: 2016-08-25 | Disposition: A | Discharge status: Home / Self Care | Payer: Medicaid Other | Source: Ambulatory Visit | Attending: Radiation Oncology | Admitting: Radiation Oncology

## 2016-08-25 ENCOUNTER — Ambulatory Visit (HOSPITAL_BASED_OUTPATIENT_CLINIC_OR_DEPARTMENT_OTHER): Payer: Medicaid Other | Admitting: Hematology

## 2016-08-25 ENCOUNTER — Encounter: Payer: Self-pay | Admitting: Hematology

## 2016-08-25 VITALS — BP 118/80 | HR 98 | Temp 97.7°F | Resp 18 | Ht 71.0 in | Wt 150.6 lb

## 2016-08-25 VITALS — BP 118/80 | HR 83 | Temp 97.5°F | Resp 18 | Ht 71.0 in | Wt 150.6 lb

## 2016-08-25 DIAGNOSIS — K6289 Other specified diseases of anus and rectum: Secondary | ICD-10-CM

## 2016-08-25 DIAGNOSIS — C20 Malignant neoplasm of rectum: Secondary | ICD-10-CM

## 2016-08-25 DIAGNOSIS — R918 Other nonspecific abnormal finding of lung field: Secondary | ICD-10-CM | POA: Diagnosis not present

## 2016-08-25 DIAGNOSIS — F411 Generalized anxiety disorder: Secondary | ICD-10-CM

## 2016-08-25 DIAGNOSIS — Z510 Encounter for antineoplastic radiation therapy: Secondary | ICD-10-CM | POA: Diagnosis not present

## 2016-08-25 NOTE — Progress Notes (Signed)
Mr. Gayton has received 6 fractions to rectum.  Denies any irritation to the rectum or change in bowel and bladder patterns, fatigue or pain.  Appetite  Has been good over the past week.  Wt Readings from Last 3 Encounters:  08/25/16 150 lb 9.6 oz (68.3 kg)  08/20/16 146 lb 14.4 oz (66.6 kg)  08/16/16 152 lb (68.9 kg)  BP 118/80 (BP Location: Right Arm, Patient Position: Sitting, Cuff Size: Normal)   Pulse 98   Temp 97.7 F (36.5 C) (Oral)   Resp 18   Ht 5\' 11"  (1.803 m)   Wt 150 lb 9.6 oz (68.3 kg)   SpO2 100%   BMI 21.00 kg/m

## 2016-08-25 NOTE — Progress Notes (Signed)
[place]  [phone] [phone]  Clinic follow Up Note   Patient Care Team: [name] as PCP - General (General Practice) 08/25/2016  CHIEF COMPLAINTS:  Follow up rectal cancer  Oncology History   Rectal adenocarcinoma [facility])   Staging form: Colon and Rectum, AJCC 7th Edition   - Clinical stage from 07/28/2016: Stage IIIB (T3, N2a, M0) - Signed by Truitt Merle, MD on 08/06/2016      Rectal adenocarcinoma ([place])   07/26/2016 Imaging    CT chest, abdomen and pelvis with contrast showed nodular thickening of the rectal wall, enlarged perirectal lymph nodes. Diverticulosis. Nonspecific low-level lymphadenopathy. Lower lobe emphysema, small 3-4 mm left lower lobe lung nodules.      07/28/2016 Initial Diagnosis    Rectal adenocarcinoma ([place])      07/28/2016 Initial Biopsy    Rectal mass biopsy showed invasive adenocarcinoma, polyps in the ascending colon and rectum showed tubular adenoma       07/28/2016 Procedure    Colonoscopy showed a nearly circumferential mass in the distal rectum, 4 polyps in the descending colon, diverticulosis in the left colon.       07/29/2016 Procedure    Low EUS showed clearly malignant 6 cm long, nearly circumferential mass in the distal rectum, with distal edge located to 1 cm from the annual approach, multiple prior rectal lymph nodes (6) are suspicious ,uT3 N2       08/17/2016 -  Radiation Therapy    Neoadjuvant radiation to his rectal cancer      08/17/2016 -  Chemotherapy    Xeloda 1500 mg twice daily with concurrent irradiation      08/18/2016 Imaging    PET scan showed hypermetabolic rectal mass, perirectal node 57mm with mild increased uptake, no hypermetabolic distant metastasis, small pulmonary nodules are too small to characterize by PET.        Chemotherapy    The patient had [No treatment plan] for chemotherapy treatment.         HISTORY OF PRESENTING ILLNESS:  [name] 45 y.o. male is  here because of His newly diagnosed rectal cancer. He is accompanied by his girlfriend to our multidisciplinary GI clinic today.  He has had rectal pain for 6 weeks, and mild rectal bleeding with BM, he goes 4 times a day, very little each time, and pencil shaped stool, he has lost 30 lbs so far. His appettie remains to be good, he eats well, no cough, nausea, no abd pain. He has a moderate fatigue, he will 2 tolerate his routine activities including his job without much difficulty. He does not have a primary care physician, and has not seen doctors for many years.  He presents to emergency room on 07/26/2016 for the rectal pain and bleeding, and was referred to gastroenterologist Dr. Loletha Carrow. He underwent colonoscopy on 07/28/2016, which showed 4 polyps in the ascending colon, and a circumferential distal rectal mass. Biopsy of the rectal mass showed adenocarcinoma. He underwent EUS by Dr. Ardis Hughs on 07/29/2016 which showed a T3 N2 disease.   CURRENT THERAPY: Concurrent chemoradiation with Xeloda 1500 mg twice daily, started on 08/17/2016  INTERIM HISTORY  Mr. Borsuk returns for follow-up. He has started concurrent chemotherapy and radiation one week ago, has been tolerating well so far. He has had mild nausea, especially at the beginning of taking Xeloda, improved lately. He takes nausea medication as needed. He otherwise feels well, no significant rectal pain or diarrhea. No hematochezia  or other new complains. His appetite and energy level are decent, he has stopped working lately, but remains to be physically active.  MEDICAL HISTORY:  Past Medical History:  Diagnosis Date  . Poor dental hygiene        SURGICAL HISTORY: Past Surgical History:  Procedure Laterality Date  . EUS N/A 07/29/2016   Procedure: LOWER ENDOSCOPIC ULTRASOUND (EUS);  Surgeon: Milus Banister, MD;  Location: [facility];  Service: Endoscopy;  Laterality: N/A;  . TOE AMPUTATION Left     SOCIAL HISTORY: Social History    Social History  . Marital status: Divorced    Spouse name: N/A  . Number of children: 1  . Years of education: N/A   Occupational History  . Not on file.   Social History Main Topics  . Smoking status: Current Every Day Smoker    Packs/day: 1.50    Years: 30.00  . Smokeless tobacco: Never Used  . Alcohol use No  . Drug use:     Types: Marijuana     Comment: patient denies  . Sexual activity: Not on file   Other Topics Concern  . Not on file   Social History Narrative   Lives with significant other, [name]   Have a 18 year old son   Smoker    FAMILY HISTORY: Family History  Problem Relation Age of Onset  . Diabetes Mother   . COPD Father   . Pancreatic cancer Neg Hx   . Esophageal cancer Neg Hx   . Stomach cancer Neg Hx   . Colon cancer Neg Hx   . Liver disease Neg Hx    He lives with his girfriend the their 72 yo son, works for [employer]   ALLERGIES:  has No Known Allergies.  MEDICATIONS:  Current Outpatient Prescriptions  Medication Sig Dispense Refill  . ALPRAZolam (XANAX) 0.5 MG tablet Take 1 tablet (0.5 mg total) by mouth at bedtime as needed for anxiety. 20 tablet 0  . capecitabine (XELODA) 500 MG tablet Take 3 tablets (1,500 mg total) by mouth 2 (two) times daily after a meal. 180 tablet 0  . docusate sodium (COLACE) 100 MG capsule Take 1 capsule (100 mg total) by mouth every 12 (twelve) hours. 60 capsule 0  . ibuprofen (ADVIL,MOTRIN) 200 MG tablet Take 400 mg by mouth every 6 (six) hours as needed for headache or moderate pain.    Marland Kitchen oxyCODONE (OXY IR/ROXICODONE) 5 MG immediate release tablet Take 1-2 tablets (5-10 mg total) by mouth every 6 (six) hours as needed for severe pain. 90 tablet 0  . prochlorperazine (COMPAZINE) 10 MG tablet Take 1 tablet (10 mg total) by mouth every 6 (six) hours as needed for nausea or vomiting. 30 tablet 2   Current Facility-Administered Medications  Medication Dose Route Frequency Provider Last Rate Last Dose  . 0.9 %   sodium chloride infusion  500 mL Intravenous Continuous Nelida Meuse III, MD        REVIEW OF SYSTEMS:   Constitutional: Denies fevers, chills or abnormal night sweats Eyes: Denies blurriness of vision, double vision or watery eyes Ears, nose, mouth, throat, and face: Denies mucositis or sore throat Respiratory: Denies cough, dyspnea or wheezes Cardiovascular: Denies palpitation, chest discomfort or lower extremity swelling Gastrointestinal:  Denies nausea, heartburn or change in bowel habits Skin: Denies abnormal skin rashes Lymphatics: Denies new lymphadenopathy or easy bruising Neurological:Denies numbness, tingling or new weaknesses Behavioral/Psych: Mood is stable, no new changes  All other systems  were reviewed with the patient and are negative.  PHYSICAL EXAMINATION: ECOG PERFORMANCE STATUS: 1 - Symptomatic but completely ambulatory  Vitals:   08/25/16 1309  BP: 118/80  Pulse: 83  Resp: 18  Temp: 97.5 F (36.4 C)   Filed Weights   08/25/16 1309  Weight: 150 lb 9.6 oz (68.3 kg)    GENERAL:alert, no distress and comfortable SKIN: skin color, texture, turgor are normal, no rashes or significant lesions EYES: normal, conjunctiva are pink and non-injected, sclera clear OROPHARYNX:no exudate, no erythema and lips, buccal mucosa, and tongue normal  NECK: supple, thyroid normal size, non-tender, without nodularity LYMPH:  no palpable lymphadenopathy in the cervical, axillary or inguinal LUNGS: clear to auscultation and percussion with normal breathing effort HEART: regular rate & rhythm and no murmurs and no lower extremity edema ABDOMEN:abdomen soft, non-tender and normal bowel sounds. Musculoskeletal:no cyanosis of digits and no clubbing  PSYCH: alert & oriented x 3 with fluent speech NEURO: no focal motor/sensory deficits  LABORATORY DATA:  I have reviewed the data as listed CBC Latest Ref Rng & Units 08/24/2016 08/16/2016 08/09/2016  WBC 4.0 - 10.3 10e3/uL 6.3 8.8 8.1   Hemoglobin 13.0 - 17.1 g/dL 13.8 15.1 15.1  Hematocrit 38.4 - 49.9 % 42.0 46.2 45.5  Platelets 140 - 400 10e3/uL 208 213 227   CMP Latest Ref Rng & Units 08/24/2016 08/16/2016 08/09/2016  Glucose 70 - 140 mg/dl 98 96 97  BUN 7.0 - 26.0 mg/dL 10.0 18.8 15.1  Creatinine 0.7 - 1.3 mg/dL 0.7 0.8 0.9  Sodium 136 - 145 mEq/L 141 143 143  Potassium 3.5 - 5.1 mEq/L 3.8 3.8 4.3  Chloride 101 - 111 mmol/L - - -  CO2 22 - 29 mEq/L 24 27 27   Calcium 8.4 - 10.4 mg/dL 8.6 8.9 8.8  Total Protein 6.4 - 8.3 g/dL 4.5(L) 4.8(L) 4.7(L)  Total Bilirubin 0.20 - 1.20 mg/dL <0.30 <0.30 <0.30  Alkaline Phos 40 - 150 U/L 56 59 62  AST 5 - 34 U/L 24 14 12   ALT 0 - 55 U/L 14 9 9    Pathology report  Diagnosis 07/28/2016 1. Surgical [P], descending, polyps(2) -TUBULAR ADENOMAS. -NO HIGH GRADE DYSPLASIA OR MALIGNANCY IDENTIFIED. 2. Surgical [P], rectal mass -INVASIVE ADENOCARCINOMA. -SEE COMMENT. 3. Surgical [P], rectum, polyps(2) -TUBULAR ADENOMAS. -NO HIGH GRADE DYSPLASIA OR MALIGNANCY IDENTIFIED. Microscopic Comment 2. Internal departmental review obtained (Dr. Saralyn Pilar) with agreement. Results are phoned to [facility], nurse in the office of Dr. Loletha Carrow. (MEG 08/03/16)  RADIOGRAPHIC STUDIES: I have personally reviewed the radiological images as listed and agreed with the findings in the report. Ct Chest W Contrast  Result Date: 08/02/2016 CLINICAL DATA:  Newly diagnosed rectal mass. Evaluate for lung metastasis. EXAM: CT CHEST WITH CONTRAST TECHNIQUE: Multidetector CT imaging of the chest was performed during intravenous contrast administration. CONTRAST:  22mL ISOVUE-300 IOPAMIDOL (ISOVUE-300) INJECTION 61% COMPARISON:  CT abdomen pelvis 07/26/2016 FINDINGS: Cardiovascular: Unremarkable Mediastinum/Nodes: Prominent RIGHT axial lymph nodes are not pathologic by size criteria measuring 10 mm short axis. RIGHT low paratracheal lymph node measures 10 mm short axis. RIGHT paratracheal node measures 9 mm short axis (image  39, series 2). Lungs/Pleura: Centrilobular emphysema in the upper lobes. Subpleural nodule within the superior aspect of the RIGHT lower lobe measures 3 mm (image 35, series 3). Subpleural nodule in the RIGHT lower lobe measures 4 mm image 53, series 3). Nodule in the superior aspect of the LEFT oblique fissure measures 5 mm (image 34, series 3). Parenchymal nodule in the  LEFT upper lobe measures 4 mm (image 35, series 3). Parenchymal nodule in the LEFT lower lobe measures 4 mm on image 71, series 3 Upper Abdomen: Limited view of the liver, kidneys, pancreas are unremarkable. Normal adrenal glands. Musculoskeletal: No aggressive osseous lesion IMPRESSION: 1. Bilateral pulmonary nodules in patient with rectal carcinoma is a concerning finding for pulmonary metastasis. While these nodules are at the lower limits of accurate PET characterization, would still consider an FDG PET scan for evaluation. 2. Borderline mediastinal adenopathy differential including reactive adenopathy related emphysema versus metastatic rectal carcinoma. Electronically Signed   By: Suzy Bouchard M.D.   On: 08/02/2016 15:27   Nm Pet Image Initial (pi) Skull Base To Thigh  Result Date: 08/18/2016 CLINICAL DATA:  Initial treatment strategy for rectal cancer. EXAM: NUCLEAR MEDICINE PET SKULL BASE TO THIGH TECHNIQUE: 12.17 mCi F-18 FDG was injected intravenously. Full-ring PET imaging was performed from the skull base to thigh after the radiotracer. CT data was obtained and used for attenuation correction and anatomic localization. FASTING BLOOD GLUCOSE:  Value: 80 mg/dl COMPARISON:  08/02/2016 FINDINGS: NECK No hypermetabolic lymph nodes in the neck. CHEST No hypermetabolic mediastinal or hilar nodes. Moderate changes of centrilobular emphysema. A few small scattered pulmonary nodules are identified which are too small to characterize by PET-CT. Index 5 mm left lower lobe lung nodule is identified, image 98 of series 3. This is too small to  characterize by PET-CT. Index perifissural nodule in the left upper lobe measures 6 mm, image 73 of series 3. Index parenchymal nodule in the left upper lobe measures 5 mm, image 77 of series 3. In the right upper lobe anteriorly there is a subpleural nodule measuring 7 mm, image 101 of series 3. No suspicious pulmonary nodules on the CT scan. ABDOMEN/PELVIS No abnormal hypermetabolic activity within the liver, pancreas, adrenal glands, or spleen. Masslike thickening of the rectum is identified compatible with the clinical history of rectal carcinoma. There is associated increased FDG uptake within SUV max equal to 12.68. Hypermetabolic tumor has a cranial caudal dimension of approximately 4.6 cm. There is a perirectal lymph node on the right posteriorly which measures 7 mm and has an SUV max equal to 3.2, image 232 of series 3. No hypermetabolic lymph nodes in the abdomen or pelvis. SKELETON No focal hypermetabolic activity to suggest skeletal metastasis. IMPRESSION: 1. Intense radiotracer uptake is associated with the masslike thickening involving the rectum compatible with primary rectal carcinoma. 2. Solitary perirectal lymph node measuring 7 mm exhibits mild increased uptake and may reflect local lymph node metastasis. 3. No hypermetabolic distant metastatic disease. 4. Small pulmonary nodules are too small to reliably characterize by PET-CT. The largest is in the right upper lobe anteriorly measuring 7 mm. 5. Emphysema. Electronically Signed   By: Kerby Moors M.D.   On: 08/18/2016 09:42   EUS 07/29/2016 -Clearly malignant 6cm long, nearly circumferential mass in the distal rectum with distal edge located 1cm from the anal verge. If this is rectal adenocarcinoma it is uT3N2a, Stage IIIa.  COLONOSCOPY 07/28/2016 - Rectal mass. - Four 4 mm polyps in the rectum and in the descending colon, removed with a cold snare. Resected and retrieved. - Diverticulosis in the left colon. - Likely malignant tumor in  the distal rectum. Biopsied. Tattooed.   ASSESSMENT & PLAN: 45 year old male without significant past medical history presented with rectal pain and bleeding.  1. 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 He also has a slightly prominent periaortic lymph node, about 8 mm. -I reviewed his PET CT scan findings with patient and his wife, the small lung nodules and periaortic lymph node are not hypermetabolic, no definitive evidence of metastasis on the PET scan. -He is currently on neoadjuvant chemoradiation, tolerating well. Lab results reviewed with patient, no significant abnormalities on lab, we'll continue concurrent chemoradiation.  2. Rectal pain  -He is taking oxycodone 5-10 mg every 6 hours, pain is better controlled   3. Anxiety  -He feels nervous and anxious since her cancer diagnosis -Continue Xanax as needed  4. Social issues -He is out of work, he wants to apply for Medicaid and disability -He was seen by our Education officer, museum  Plan -continue concurrent chemoradiation with Xeloda 1500 mg twice daily  -lab weekly  -I'll see him back on 9/18   All questions were answered. The patient knows to call the clinic with any problems, questions or concerns. I spent 20 minutes counseling the patient face to face. The total time spent in the appointment was 25 minutes and more than 50% was on counseling.     Truitt Merle, MD 08/25/2016 2:38 PM

## 2016-08-25 NOTE — Progress Notes (Signed)
Department of Radiation Oncology  Phone:  [phone] Fax:        [phone]  Weekly Treatment Note    Name: [name] Date: 08/25/2016 MRN: [id number] DOB: 04/17/1971   Diagnosis:     ICD-9-CM ICD-10-CM   1. 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 ALPRAZolam (XANAX) 0.5 MG tablet Take 1 tablet (0.5 mg total) by mouth at bedtime as needed for anxiety. 20 tablet 0  . capecitabine (XELODA) 500 MG tablet Take 3 tablets (1,500 mg total) by mouth 2 (two) times daily after a meal. 180 tablet 0  . docusate sodium (COLACE) 100 MG capsule Take 1 capsule (100 mg total) by mouth every 12 (twelve) hours. 60 capsule 0  . ibuprofen (ADVIL,MOTRIN) 200 MG tablet Take 400 mg by mouth every 6 (six) hours as needed for headache or moderate pain.    Marland Kitchen oxyCODONE (OXY IR/ROXICODONE) 5 MG immediate release tablet Take 1-2 tablets (5-10 mg total) by mouth every 6 (six) hours as needed for severe pain. 90 tablet 0  . prochlorperazine (COMPAZINE) 10 MG tablet Take 1 tablet (10 mg total) by mouth every 6 (six) hours as needed for nausea or vomiting. 30 tablet 2   Current Facility-Administered Medications  Medication Dose Route Frequency Provider Last Rate Last Dose  . 0.9 %  sodium chloride infusion  500 mL Intravenous Continuous Nelida Meuse III, MD         ALLERGIES: Review of patient's allergies indicates no known allergies.   LABORATORY DATA:  Lab Results  Component Value Date   WBC 6.3 08/24/2016   HGB 13.8 08/24/2016   HCT 42.0 08/24/2016   MCV 86.9 08/24/2016   PLT 208 08/24/2016   Lab Results  Component Value Date   NA 141 08/24/2016   K 3.8 08/24/2016   CL 108 07/26/2016   CO2 24 08/24/2016   Lab Results  Component Value Date   ALT 14 08/24/2016   AST 24 08/24/2016   ALKPHOS 56 08/24/2016   BILITOT <0.30 08/24/2016     NARRATIVE:  [name] was seen today for weekly treatment management. The chart was checked and the patient's films were reviewed.  Mr. Alkins has received 6 fractions to rectum.  Denies any irritation to the rectum or change in bowel and bladder patterns, fatigue or pain.  Appetite  Has been good over the past week.  Wt Readings from Last 3 Encounters:  08/25/16 150 lb 9.6 oz (68.3 kg)  08/25/16 150 lb 9.6 oz (68.3 kg)  08/20/16 146 lb 14.4 oz (66.6 kg)  BP 118/80 (BP Location: Right Arm, Patient Position: Sitting, Cuff Size: Normal)   Pulse 98   Temp 97.7 F (36.5 C) (Oral)   Resp 18   Ht 5\' 11"  (1.803 m)   Wt 150 lb 9.6 oz (68.3 kg)   SpO2 100%   BMI 21.00 kg/m     PHYSICAL EXAMINATION: height is 5\' 11"  (1.803 m) and weight is 150 lb 9.6 oz (68.3 kg). His oral temperature is 97.7 F (36.5 C). His blood pressure is 118/80 and his pulse is 98. His respiration is 18 and oxygen saturation is 100%.        ASSESSMENT: The patient is doing satisfactorily with treatment.  PLAN: We will continue with the patient's radiation treatment as planned.

## 2016-08-26 ENCOUNTER — Ambulatory Visit
Admission: RE | Admit: 2016-08-26 | Discharge: 2016-08-26 | Disposition: A | Discharge status: Home / Self Care | Payer: Medicaid Other | Source: Ambulatory Visit | Attending: Radiation Oncology | Admitting: Radiation Oncology

## 2016-08-26 DIAGNOSIS — Z510 Encounter for antineoplastic radiation therapy: Secondary | ICD-10-CM | POA: Diagnosis not present

## 2016-08-27 ENCOUNTER — Ambulatory Visit
Admission: RE | Admit: 2016-08-27 | Discharge: 2016-08-27 | Disposition: A | Discharge status: Home / Self Care | Payer: Medicaid Other | Source: Ambulatory Visit | Attending: Radiation Oncology | Admitting: Radiation Oncology

## 2016-08-27 DIAGNOSIS — Z510 Encounter for antineoplastic radiation therapy: Secondary | ICD-10-CM | POA: Diagnosis not present

## 2016-08-30 ENCOUNTER — Ambulatory Visit
Admission: RE | Admit: 2016-08-30 | Discharge: 2016-08-30 | Disposition: A | Discharge status: Home / Self Care | Payer: Medicaid Other | Source: Ambulatory Visit | Attending: Radiation Oncology | Admitting: Radiation Oncology

## 2016-08-30 ENCOUNTER — Other Ambulatory Visit (HOSPITAL_BASED_OUTPATIENT_CLINIC_OR_DEPARTMENT_OTHER): Payer: Medicaid Other

## 2016-08-30 DIAGNOSIS — Z510 Encounter for antineoplastic radiation therapy: Secondary | ICD-10-CM | POA: Diagnosis not present

## 2016-08-30 DIAGNOSIS — C20 Malignant neoplasm of rectum: Secondary | ICD-10-CM | POA: Diagnosis present

## 2016-08-30 LAB — COMPREHENSIVE METABOLIC PANEL
ALK PHOS: 50 U/L (ref 40–150)
ALT: 11 U/L (ref 0–55)
ANION GAP: 6 meq/L (ref 3–11)
AST: 11 U/L (ref 5–34)
Albumin: 2.3 g/dL — ABNORMAL LOW (ref 3.5–5.0)
BUN: 15.4 mg/dL (ref 7.0–26.0)
CALCIUM: 8.3 mg/dL — AB (ref 8.4–10.4)
CO2: 24 meq/L (ref 22–29)
CREATININE: 0.8 mg/dL (ref 0.7–1.3)
Chloride: 111 mEq/L — ABNORMAL HIGH (ref 98–109)
GLUCOSE: 58 mg/dL — AB (ref 70–140)
Potassium: 4 mEq/L (ref 3.5–5.1)
Sodium: 141 mEq/L (ref 136–145)
Total Protein: 4.8 g/dL — ABNORMAL LOW (ref 6.4–8.3)

## 2016-08-30 LAB — CBC WITH DIFFERENTIAL/PLATELET
BASO%: 0.6 % (ref 0.0–2.0)
BASOS ABS: 0 10*3/uL (ref 0.0–0.1)
EOS ABS: 0.1 10*3/uL (ref 0.0–0.5)
EOS%: 2.2 % (ref 0.0–7.0)
HEMATOCRIT: 44.9 % (ref 38.4–49.9)
HGB: 15.3 g/dL (ref 13.0–17.1)
LYMPH%: 13.1 % — AB (ref 14.0–49.0)
MCH: 29.7 pg (ref 27.2–33.4)
MCHC: 34 g/dL (ref 32.0–36.0)
MCV: 87.2 fL (ref 79.3–98.0)
MONO#: 0.5 10*3/uL (ref 0.1–0.9)
MONO%: 7.6 % (ref 0.0–14.0)
NEUT#: 4.8 10*3/uL (ref 1.5–6.5)
NEUT%: 76.5 % — AB (ref 39.0–75.0)
PLATELETS: 191 10*3/uL (ref 140–400)
RBC: 5.14 10*6/uL (ref 4.20–5.82)
RDW: 15.3 % — ABNORMAL HIGH (ref 11.0–14.6)
WBC: 6.3 10*3/uL (ref 4.0–10.3)
lymph#: 0.8 10*3/uL — ABNORMAL LOW (ref 0.9–3.3)

## 2016-08-31 ENCOUNTER — Ambulatory Visit
Admission: RE | Admit: 2016-08-31 | Discharge: 2016-08-31 | Disposition: A | Discharge status: Home / Self Care | Payer: Medicaid Other | Source: Ambulatory Visit | Attending: Radiation Oncology | Admitting: Radiation Oncology

## 2016-08-31 DIAGNOSIS — Z510 Encounter for antineoplastic radiation therapy: Secondary | ICD-10-CM | POA: Diagnosis not present

## 2016-09-01 ENCOUNTER — Ambulatory Visit
Admission: RE | Admit: 2016-09-01 | Discharge: 2016-09-01 | Disposition: A | Discharge status: Home / Self Care | Payer: Medicaid Other | Source: Ambulatory Visit | Attending: Radiation Oncology | Admitting: Radiation Oncology

## 2016-09-01 VITALS — BP 100/67 | HR 76 | Temp 97.8°F | Resp 18 | Ht 71.0 in | Wt 146.7 lb

## 2016-09-01 DIAGNOSIS — C20 Malignant neoplasm of rectum: Secondary | ICD-10-CM

## 2016-09-01 DIAGNOSIS — Z510 Encounter for antineoplastic radiation therapy: Secondary | ICD-10-CM | POA: Diagnosis not present

## 2016-09-01 NOTE — Progress Notes (Signed)
Department of Radiation Oncology  Phone:  [phone] Fax:        [phone]  Weekly Treatment Note    Name: [name] Date: 09/01/2016 MRN: [id number] DOB: Aug 04, 1971   Diagnosis:     ICD-9-CM ICD-10-CM   1. 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 ALPRAZolam (XANAX) 0.5 MG tablet Take 1 tablet (0.5 mg total) by mouth at bedtime as needed for anxiety. 20 tablet 0  . capecitabine (XELODA) 500 MG tablet Take 3 tablets (1,500 mg total) by mouth 2 (two) times daily after a meal. 180 tablet 0  . docusate sodium (COLACE) 100 MG capsule Take 1 capsule (100 mg total) by mouth every 12 (twelve) hours. 60 capsule 0  . ibuprofen (ADVIL,MOTRIN) 200 MG tablet Take 400 mg by mouth every 6 (six) hours as needed for headache or moderate pain.    Marland Kitchen oxyCODONE (OXY IR/ROXICODONE) 5 MG immediate release tablet Take 1-2 tablets (5-10 mg total) by mouth every 6 (six) hours as needed for severe pain. 90 tablet 0  . prochlorperazine (COMPAZINE) 10 MG tablet Take 1 tablet (10 mg total) by mouth every 6 (six) hours as needed for nausea or vomiting. 30 tablet 2   Current Facility-Administered Medications  Medication Dose Route Frequency Provider Last Rate Last Dose  . 0.9 %  sodium chloride infusion  500 mL Intravenous Continuous Nelida Meuse III, MD         ALLERGIES: Review of patient's allergies indicates no known allergies.   LABORATORY DATA:  Lab Results  Component Value Date   WBC 6.3 08/30/2016   HGB 15.3 08/30/2016   HCT 44.9 08/30/2016   MCV 87.2 08/30/2016   PLT 191 08/30/2016   Lab Results  Component Value Date   NA 141 08/30/2016   K 4.0 08/30/2016   CL 108 07/26/2016   CO2 24 08/30/2016   Lab Results  Component Value Date   ALT 11 08/30/2016   AST 11 08/30/2016   ALKPHOS 50 08/30/2016   BILITOT <0.30 08/30/2016     NARRATIVE:  [name] was seen today for weekly treatment management. The chart was checked and the patient's films were reviewed.  Weekly rad txs 11/25 completed rectal , no pain, still smokes 1.5ppd, has congestive smokers cough , appetite fair.  Having fatigue in the afternoons. Wt Readings from Last 3 Encounters:  09/01/16 146 lb 11.2 oz (66.5 kg)  08/25/16 150 lb 9.6 oz (68.3 kg)  08/25/16 150 lb 9.6 oz (68.3 kg)  BP 100/67 (BP Location: Right Arm, Patient Position: Standing, Cuff Size: Normal)   Pulse 76   Temp 97.8 F (36.6 C) (Oral)   Resp 18   Ht 5\' 11"  (1.803 m)   Wt 146 lb 11.2 oz (66.5 kg)   SpO2 100%   BMI 20.46 kg/m   PHYSICAL EXAMINATION: height is 5\' 11"  (1.803 m) and weight is 146 lb 11.2 oz (66.5 kg). His oral temperature is 97.8 F (36.6 C). His blood pressure is 100/67 and his pulse is 76. His respiration is 18 and oxygen saturation is 100%.        ASSESSMENT: The patient is doing satisfactorily with treatment.  PLAN: We will continue with the patient's radiation treatment as planned. The patient is doing well. He states that his bowel movements have normalized as compared to prior to  treatment which she is very pleased with.

## 2016-09-01 NOTE — Progress Notes (Signed)
Weekly rad txs 11/25 completed rectal , no pain, still smokes 1.5ppd, has congestive smokers cough , appetite fair.  Having fatigue in the afternoons. Wt Readings from Last 3 Encounters:  09/01/16 146 lb 11.2 oz (66.5 kg)  08/25/16 150 lb 9.6 oz (68.3 kg)  08/25/16 150 lb 9.6 oz (68.3 kg)  BP 100/67 (BP Location: Right Arm, Patient Position: Standing, Cuff Size: Normal)   Pulse 76   Temp 97.8 F (36.6 C) (Oral)   Resp 18   Ht 5\' 11"  (1.803 m)   Wt 146 lb 11.2 oz (66.5 kg)   SpO2 100%   BMI 20.46 kg/m

## 2016-09-02 ENCOUNTER — Ambulatory Visit
Admission: RE | Admit: 2016-09-02 | Discharge: 2016-09-02 | Disposition: A | Discharge status: Home / Self Care | Payer: Medicaid Other | Source: Ambulatory Visit | Attending: Radiation Oncology | Admitting: Radiation Oncology

## 2016-09-02 DIAGNOSIS — Z510 Encounter for antineoplastic radiation therapy: Secondary | ICD-10-CM | POA: Diagnosis not present

## 2016-09-03 ENCOUNTER — Ambulatory Visit
Admission: RE | Admit: 2016-09-03 | Discharge: 2016-09-03 | Disposition: A | Discharge status: Home / Self Care | Payer: Medicaid Other | Source: Ambulatory Visit | Attending: Radiation Oncology | Admitting: Radiation Oncology

## 2016-09-03 ENCOUNTER — Telehealth: Payer: Self-pay | Admitting: Hematology

## 2016-09-03 DIAGNOSIS — Z510 Encounter for antineoplastic radiation therapy: Secondary | ICD-10-CM | POA: Diagnosis not present

## 2016-09-03 NOTE — Telephone Encounter (Signed)
Patient called to have lab appointments scheduled closer to time of  Treatments. Rescheduled labs closer called to verify times of rescheduled appointments. Was not able to reach the patient and voicemail is not set up.

## 2016-09-06 ENCOUNTER — Other Ambulatory Visit (HOSPITAL_BASED_OUTPATIENT_CLINIC_OR_DEPARTMENT_OTHER): Payer: Medicaid Other

## 2016-09-06 ENCOUNTER — Ambulatory Visit (HOSPITAL_BASED_OUTPATIENT_CLINIC_OR_DEPARTMENT_OTHER): Payer: Medicaid Other | Admitting: Hematology

## 2016-09-06 ENCOUNTER — Ambulatory Visit
Admission: RE | Admit: 2016-09-06 | Discharge: 2016-09-06 | Disposition: A | Discharge status: Home / Self Care | Payer: Medicaid Other | Source: Ambulatory Visit | Attending: Radiation Oncology | Admitting: Radiation Oncology

## 2016-09-06 ENCOUNTER — Encounter: Payer: Self-pay | Admitting: Hematology

## 2016-09-06 VITALS — BP 103/69 | HR 57 | Temp 97.7°F | Resp 17 | Ht 71.0 in | Wt 150.6 lb

## 2016-09-06 DIAGNOSIS — Z510 Encounter for antineoplastic radiation therapy: Secondary | ICD-10-CM | POA: Diagnosis not present

## 2016-09-06 DIAGNOSIS — K6289 Other specified diseases of anus and rectum: Secondary | ICD-10-CM | POA: Diagnosis not present

## 2016-09-06 DIAGNOSIS — C20 Malignant neoplasm of rectum: Secondary | ICD-10-CM

## 2016-09-06 DIAGNOSIS — F419 Anxiety disorder, unspecified: Secondary | ICD-10-CM | POA: Diagnosis not present

## 2016-09-06 LAB — COMPREHENSIVE METABOLIC PANEL
ALBUMIN: 2.2 g/dL — AB (ref 3.5–5.0)
ALK PHOS: 53 U/L (ref 40–150)
ALT: 12 U/L (ref 0–55)
ANION GAP: 5 meq/L (ref 3–11)
AST: 12 U/L (ref 5–34)
BUN: 14 mg/dL (ref 7.0–26.0)
CALCIUM: 8.7 mg/dL (ref 8.4–10.4)
CHLORIDE: 110 meq/L — AB (ref 98–109)
CO2: 28 mEq/L (ref 22–29)
Creatinine: 0.9 mg/dL (ref 0.7–1.3)
Glucose: 69 mg/dl — ABNORMAL LOW (ref 70–140)
POTASSIUM: 3.9 meq/L (ref 3.5–5.1)
Sodium: 143 mEq/L (ref 136–145)
Total Bilirubin: <0.3 mg/dL (ref 0.20–1.20)
Total Protein: 4.4 g/dL — ABNORMAL LOW (ref 6.4–8.3)

## 2016-09-06 LAB — CBC WITH DIFFERENTIAL/PLATELET
BASO%: 0.7 % (ref 0.0–2.0)
BASOS ABS: 0 10*3/uL (ref 0.0–0.1)
EOS ABS: 0.2 10*3/uL (ref 0.0–0.5)
EOS%: 3.1 % (ref 0.0–7.0)
HEMATOCRIT: 42.8 % (ref 38.4–49.9)
HEMOGLOBIN: 14.4 g/dL (ref 13.0–17.1)
LYMPH#: 0.6 10*3/uL — AB (ref 0.9–3.3)
LYMPH%: 9.3 % — ABNORMAL LOW (ref 14.0–49.0)
MCH: 29.3 pg (ref 27.2–33.4)
MCHC: 33.6 g/dL (ref 32.0–36.0)
MCV: 87.2 fL (ref 79.3–98.0)
MONO#: 0.5 10*3/uL (ref 0.1–0.9)
MONO%: 8.3 % (ref 0.0–14.0)
NEUT#: 4.8 10*3/uL (ref 1.5–6.5)
NEUT%: 78.6 % — AB (ref 39.0–75.0)
Platelets: 161 10*3/uL (ref 140–400)
RBC: 4.91 10*6/uL (ref 4.20–5.82)
RDW: 15.7 % — AB (ref 11.0–14.6)
WBC: 6.1 10*3/uL (ref 4.0–10.3)

## 2016-09-06 MED ORDER — OXYCODONE HCL 5 MG PO TABS: 5.0000 mg | ORAL_TABLET | Freq: Four times a day (QID) | ORAL | 0 refills | Status: DC | PRN

## 2016-09-06 MED ORDER — ALPRAZOLAM 0.5 MG PO TABS: 0.5000 mg | ORAL_TABLET | Freq: Every evening | ORAL | 0 refills | Status: DC | PRN

## 2016-09-06 NOTE — Progress Notes (Signed)
[facility]  Telephone:([phone] [phone]  Clinic follow Up Note   Patient Care Team: [name] as PCP - General (General Practice) 09/06/2016  CHIEF COMPLAINTS:  Follow up rectal cancer  Oncology History   Rectal adenocarcinoma [facility])   Staging form: Colon and Rectum, AJCC 7th Edition   - Clinical stage from 07/28/2016: Stage IIIB (T3, N2a, M0) - Signed by Truitt Merle, MD on 08/06/2016      Rectal adenocarcinoma ([place])   07/26/2016 Imaging    CT chest, abdomen and pelvis with contrast showed nodular thickening of the rectal wall, enlarged perirectal lymph nodes. Diverticulosis. Nonspecific low-level lymphadenopathy. Lower lobe emphysema, small 3-4 mm left lower lobe lung nodules.      07/28/2016 Initial Diagnosis    Rectal adenocarcinoma ([place])      07/28/2016 Initial Biopsy    Rectal mass biopsy showed invasive adenocarcinoma, polyps in the ascending colon and rectum showed tubular adenoma       07/28/2016 Procedure    Colonoscopy showed a nearly circumferential mass in the distal rectum, 4 polyps in the descending colon, diverticulosis in the left colon.       07/29/2016 Procedure    Low EUS showed clearly malignant 6 cm long, nearly circumferential mass in the distal rectum, with distal edge located to 1 cm from the annual approach, multiple prior rectal lymph nodes (6) are suspicious ,uT3 N2       08/17/2016 -  Radiation Therapy    Neoadjuvant radiation to his rectal cancer      08/17/2016 -  Chemotherapy    Xeloda 1500 mg twice daily with concurrent irradiation      08/18/2016 Imaging    PET scan showed hypermetabolic rectal mass, perirectal node 20mm with mild increased uptake, no hypermetabolic distant metastasis, small pulmonary nodules are too small to characterize by PET.        HISTORY OF PRESENTING ILLNESS:  [name] 45 y.o. male is here because of His newly diagnosed rectal cancer. He is accompanied by his girlfriend to our  multidisciplinary GI clinic today.  He has had rectal pain for 6 weeks, and mild rectal bleeding with BM, he goes 4 times a day, very little each time, and pencil shaped stool, he has lost 30 lbs so far. His appettie remains to be good, he eats well, no cough, nausea, no abd pain. He has a moderate fatigue, he will 2 tolerate his routine activities including his job without much difficulty. He does not have a primary care physician, and has not seen doctors for many years.  He presents to emergency room on 07/26/2016 for the rectal pain and bleeding, and was referred to gastroenterologist Dr. Loletha Carrow. He underwent colonoscopy on 07/28/2016, which showed 4 polyps in the ascending colon, and a circumferential distal rectal mass. Biopsy of the rectal mass showed adenocarcinoma. He underwent EUS by Dr. Ardis Hughs on 07/29/2016 which showed a T3 N2 disease.   CURRENT THERAPY: Concurrent chemoradiation with Xeloda 1500 mg twice daily, started on 08/17/2016  INTERIM HISTORY  Mr. Schoeff returns for follow-up. He has been tolerating concurrent chemoradiation well overall. He does have moderate to rectal pain, slightly worse than before, especially at night, he takes oxycodone 3 times a day. He also has some difficulty last night, for which he takes Xanax. His bowel movement has been normal, no significant rectal bleeding. No nausea, or other side effects from chemotherapy. His appetite and energy level has been moderate and overall  stable. His weight is stable.  MEDICAL HISTORY:  Past Medical History:  Diagnosis Date  . Poor dental hygiene        SURGICAL HISTORY: Past Surgical History:  Procedure Laterality Date  . EUS N/A 07/29/2016   Procedure: LOWER ENDOSCOPIC ULTRASOUND (EUS);  Surgeon: Milus Banister, MD;  Location: [facility];  Service: Endoscopy;  Laterality: N/A;  . TOE AMPUTATION Left     SOCIAL HISTORY: Social History   Social History  . Marital status: Divorced    Spouse name: N/A  .  Number of children: 1  . Years of education: N/A   Occupational History  . Not on file.   Social History Main Topics  . Smoking status: Current Every Day Smoker    Packs/day: 1.50    Years: 30.00  . Smokeless tobacco: Never Used  . Alcohol use No  . Drug use:     Types: Marijuana     Comment: patient denies  . Sexual activity: Not on file   Other Topics Concern  . Not on file   Social History Narrative   Lives with significant other, [name]   Have a 6 year old son   Smoker    FAMILY HISTORY: Family History  Problem Relation Age of Onset  . Diabetes Mother   . COPD Father   . Pancreatic cancer Neg Hx   . Esophageal cancer Neg Hx   . Stomach cancer Neg Hx   . Colon cancer Neg Hx   . Liver disease Neg Hx    He lives with his girfriend the their 59 yo son, works for [employer]   ALLERGIES:  has No Known Allergies.  MEDICATIONS:  Current Outpatient Prescriptions  Medication Sig Dispense Refill  . ALPRAZolam (XANAX) 0.5 MG tablet Take 1-2 tablets (0.5-1 mg total) by mouth at bedtime as needed for anxiety. 30 tablet 0  . capecitabine (XELODA) 500 MG tablet Take 3 tablets (1,500 mg total) by mouth 2 (two) times daily after a meal. 180 tablet 0  . docusate sodium (COLACE) 100 MG capsule Take 1 capsule (100 mg total) by mouth every 12 (twelve) hours. 60 capsule 0  . ibuprofen (ADVIL,MOTRIN) 200 MG tablet Take 400 mg by mouth every 6 (six) hours as needed for headache or moderate pain.    Marland Kitchen oxyCODONE (OXY IR/ROXICODONE) 5 MG immediate release tablet Take 1-2 tablets (5-10 mg total) by mouth every 6 (six) hours as needed for severe pain. 90 tablet 0  . prochlorperazine (COMPAZINE) 10 MG tablet Take 1 tablet (10 mg total) by mouth every 6 (six) hours as needed for nausea or vomiting. 30 tablet 2   Current Facility-Administered Medications  Medication Dose Route Frequency Provider Last Rate Last Dose  . 0.9 %  sodium chloride infusion  500 mL Intravenous Continuous Nelida Meuse III, MD        REVIEW OF SYSTEMS:   Constitutional: Denies fevers, chills or abnormal night sweats Eyes: Denies blurriness of vision, double vision or watery eyes Ears, nose, mouth, throat, and face: Denies mucositis or sore throat Respiratory: Denies cough, dyspnea or wheezes Cardiovascular: Denies palpitation, chest discomfort or lower extremity swelling Gastrointestinal:  Denies nausea, heartburn or change in bowel habits Skin: Denies abnormal skin rashes Lymphatics: Denies new lymphadenopathy or easy bruising Neurological:Denies numbness, tingling or new weaknesses Behavioral/Psych: Mood is stable, no new changes  All other systems were reviewed with the patient and are negative.  PHYSICAL EXAMINATION: ECOG PERFORMANCE STATUS: 1 - Symptomatic  but completely ambulatory  Vitals:   09/06/16 1026  BP: 103/69  Pulse: (!) 57  Resp: 17  Temp: 97.7 F (36.5 C)   Filed Weights   09/06/16 1026  Weight: 150 lb 9.6 oz (68.3 kg)    GENERAL:alert, no distress and comfortable SKIN: skin color, texture, turgor are normal, no rashes or significant lesions EYES: normal, conjunctiva are pink and non-injected, sclera clear OROPHARYNX:no exudate, no erythema and lips, buccal mucosa, and tongue normal  NECK: supple, thyroid normal size, non-tender, without nodularity LYMPH:  no palpable lymphadenopathy in the cervical, axillary or inguinal LUNGS: clear to auscultation and percussion with normal breathing effort HEART: regular rate & rhythm and no murmurs and no lower extremity edema ABDOMEN:abdomen soft, non-tender and normal bowel sounds. Musculoskeletal:no cyanosis of digits and no clubbing  PSYCH: alert & oriented x 3 with fluent speech NEURO: no focal motor/sensory deficits  LABORATORY DATA:  I have reviewed the data as listed CBC Latest Ref Rng & Units 09/06/2016 08/30/2016 08/24/2016  WBC 4.0 - 10.3 10e3/uL 6.1 6.3 6.3  Hemoglobin 13.0 - 17.1 g/dL 14.4 15.3 13.8  Hematocrit  38.4 - 49.9 % 42.8 44.9 42.0  Platelets 140 - 400 10e3/uL 161 191 208   CMP Latest Ref Rng & Units 09/06/2016 08/30/2016 08/24/2016  Glucose 70 - 140 mg/dl 69(L) 58(L) 98  BUN 7.0 - 26.0 mg/dL 14.0 15.4 10.0  Creatinine 0.7 - 1.3 mg/dL 0.9 0.8 0.7  Sodium 136 - 145 mEq/L 143 141 141  Potassium 3.5 - 5.1 mEq/L 3.9 4.0 3.8  Chloride 101 - 111 mmol/L - - -  CO2 22 - 29 mEq/L 28 24 24   Calcium 8.4 - 10.4 mg/dL 8.7 8.3(L) 8.6  Total Protein 6.4 - 8.3 g/dL 4.4(L) 4.8(L) 4.5(L)  Total Bilirubin 0.20 - 1.20 mg/dL <0.30 <0.30 <0.30  Alkaline Phos 40 - 150 U/L 53 50 56  AST 5 - 34 U/L 12 11 24   ALT 0 - 55 U/L 12 11 14    Pathology report  Diagnosis 07/28/2016 1. Surgical [P], descending, polyps(2) -TUBULAR ADENOMAS. -NO HIGH GRADE DYSPLASIA OR MALIGNANCY IDENTIFIED. 2. Surgical [P], rectal mass -INVASIVE ADENOCARCINOMA. -SEE COMMENT. 3. Surgical [P], rectum, polyps(2) -TUBULAR ADENOMAS. -NO HIGH GRADE DYSPLASIA OR MALIGNANCY IDENTIFIED. Microscopic Comment 2. Internal departmental review obtained (Dr. Saralyn Pilar) with agreement. Results are phoned to [facility], nurse in the office of Dr. Loletha Carrow. (MEG 08/03/16)  RADIOGRAPHIC STUDIES: I have personally reviewed the radiological images as listed and agreed with the findings in the report. Nm Pet Image Initial (pi) Skull Base To Thigh  Result Date: 08/18/2016 CLINICAL DATA:  Initial treatment strategy for rectal cancer. EXAM: NUCLEAR MEDICINE PET SKULL BASE TO THIGH TECHNIQUE: 12.17 mCi F-18 FDG was injected intravenously. Full-ring PET imaging was performed from the skull base to thigh after the radiotracer. CT data was obtained and used for attenuation correction and anatomic localization. FASTING BLOOD GLUCOSE:  Value: 80 mg/dl COMPARISON:  08/02/2016 FINDINGS: NECK No hypermetabolic lymph nodes in the neck. CHEST No hypermetabolic mediastinal or hilar nodes. Moderate changes of centrilobular emphysema. A few small scattered pulmonary nodules are  identified which are too small to characterize by PET-CT. Index 5 mm left lower lobe lung nodule is identified, image 98 of series 3. This is too small to characterize by PET-CT. Index perifissural nodule in the left upper lobe measures 6 mm, image 73 of series 3. Index parenchymal nodule in the left upper lobe measures 5 mm, image 77 of series 3. In the right  upper lobe anteriorly there is a subpleural nodule measuring 7 mm, image 101 of series 3. No suspicious pulmonary nodules on the CT scan. ABDOMEN/PELVIS No abnormal hypermetabolic activity within the liver, pancreas, adrenal glands, or spleen. Masslike thickening of the rectum is identified compatible with the clinical history of rectal carcinoma. There is associated increased FDG uptake within SUV max equal to 12.68. Hypermetabolic tumor has a cranial caudal dimension of approximately 4.6 cm. There is a perirectal lymph node on the right posteriorly which measures 7 mm and has an SUV max equal to 3.2, image 232 of series 3. No hypermetabolic lymph nodes in the abdomen or pelvis. SKELETON No focal hypermetabolic activity to suggest skeletal metastasis. IMPRESSION: 1. Intense radiotracer uptake is associated with the masslike thickening involving the rectum compatible with primary rectal carcinoma. 2. Solitary perirectal lymph node measuring 7 mm exhibits mild increased uptake and may reflect local lymph node metastasis. 3. No hypermetabolic distant metastatic disease. 4. Small pulmonary nodules are too small to reliably characterize by PET-CT. The largest is in the right upper lobe anteriorly measuring 7 mm. 5. Emphysema. Electronically Signed   By: Kerby Moors M.D.   On: 08/18/2016 09:42   EUS 07/29/2016 -Clearly malignant 6cm long, nearly circumferential mass in the distal rectum with distal edge located 1cm from the anal verge. If this is rectal adenocarcinoma it is uT3N2a, Stage IIIa.  COLONOSCOPY 07/28/2016 - Rectal mass. - Four 4 mm polyps in  the rectum and in the descending colon, removed with a cold snare. Resected and retrieved. - Diverticulosis in the left colon. - Likely malignant tumor in the distal rectum. Biopsied. Tattooed.   ASSESSMENT & PLAN: 45 year old male without significant past medical history presented with rectal pain and bleeding.  1. 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 He also has a slightly prominent periaortic lymph node, about 8 mm. -I reviewed his PET CT scan findings with patient and his wife, the small lung nodules and periaortic lymph node are not hypermetabolic, no definitive evidence of metastasis on the PET scan. -He is currently on neoadjuvant chemoradiation, tolerating moderate well overall. Lab results reviewed with patient, no significant abnormalities on lab, we'll continue concurrent chemoradiation.  2. Rectal pain  -worse lately with chemo and radiation  -He is taking oxycodone 5-10 mg every 6 hours, pain is overall controlled   3. Anxiety  -He feels nervous and anxious since his cancer diagnosis -Continue Xanax as needed  4. Social issues -He is out of work, he wants to apply for Medicaid and disability -He was seen by our Education officer, museum  Plan -continue concurrent chemoradiation with Xeloda 1500 mg twice daily  -I refilled his oxycodone and xanax  -I'll see him back on 9/26   All questions were answered. The patient knows to call the clinic with any problems, questions or concerns. I spent 20 minutes counseling the patient face to face. The total time spent in the appointment was 25 minutes and more than 50% was on counseling.     Truitt Merle,  MD 09/06/2016 10:58 PM

## 2016-09-07 ENCOUNTER — Ambulatory Visit
Admission: RE | Admit: 2016-09-07 | Discharge: 2016-09-07 | Disposition: A | Discharge status: Home / Self Care | Payer: Medicaid Other | Source: Ambulatory Visit | Attending: Radiation Oncology | Admitting: Radiation Oncology

## 2016-09-07 DIAGNOSIS — Z510 Encounter for antineoplastic radiation therapy: Secondary | ICD-10-CM | POA: Diagnosis not present

## 2016-09-08 ENCOUNTER — Ambulatory Visit
Admission: RE | Admit: 2016-09-08 | Discharge: 2016-09-08 | Disposition: A | Discharge status: Home / Self Care | Payer: Medicaid Other | Source: Ambulatory Visit | Attending: Radiation Oncology | Admitting: Radiation Oncology

## 2016-09-08 DIAGNOSIS — Z510 Encounter for antineoplastic radiation therapy: Secondary | ICD-10-CM | POA: Diagnosis not present

## 2016-09-09 ENCOUNTER — Ambulatory Visit
Admission: RE | Admit: 2016-09-09 | Discharge: 2016-09-09 | Disposition: A | Discharge status: Home / Self Care | Payer: Medicaid Other | Source: Ambulatory Visit | Attending: Radiation Oncology | Admitting: Radiation Oncology

## 2016-09-09 DIAGNOSIS — Z510 Encounter for antineoplastic radiation therapy: Secondary | ICD-10-CM | POA: Diagnosis not present

## 2016-09-10 ENCOUNTER — Encounter: Payer: Self-pay | Admitting: Radiation Oncology

## 2016-09-10 ENCOUNTER — Ambulatory Visit
Admission: RE | Admit: 2016-09-10 | Discharge: 2016-09-10 | Disposition: A | Discharge status: Home / Self Care | Payer: Medicaid Other | Source: Ambulatory Visit | Attending: Radiation Oncology | Admitting: Radiation Oncology

## 2016-09-10 VITALS — BP 111/73 | HR 62 | Temp 98.0°F | Wt 152.2 lb

## 2016-09-10 DIAGNOSIS — C20 Malignant neoplasm of rectum: Secondary | ICD-10-CM

## 2016-09-10 DIAGNOSIS — Z510 Encounter for antineoplastic radiation therapy: Secondary | ICD-10-CM | POA: Diagnosis not present

## 2016-09-10 NOTE — Progress Notes (Signed)
   Department of Radiation Oncology  Phone:  [phone] Fax:        [phone]  Weekly Treatment Note    Name: [name] Date: 09/10/2016 MRN: [id number] DOB: Feb 15, 1971   Diagnosis:  No diagnosis found.   Current dose: 32.4 Gy  Current fraction:18   MEDICATIONS: Current Outpatient Prescriptions  Medication Sig Dispense Refill  . ALPRAZolam (XANAX) 0.5 MG tablet Take 1-2 tablets (0.5-1 mg total) by mouth at bedtime as needed for anxiety. 30 tablet 0  . capecitabine (XELODA) 500 MG tablet Take 3 tablets (1,500 mg total) by mouth 2 (two) times daily after a meal. 180 tablet 0  . docusate sodium (COLACE) 100 MG capsule Take 1 capsule (100 mg total) by mouth every 12 (twelve) hours. 60 capsule 0  . ibuprofen (ADVIL,MOTRIN) 200 MG tablet Take 400 mg by mouth every 6 (six) hours as needed for headache or moderate pain.    Marland Kitchen oxyCODONE (OXY IR/ROXICODONE) 5 MG immediate release tablet Take 1-2 tablets (5-10 mg total) by mouth every 6 (six) hours as needed for severe pain. 90 tablet 0  . prochlorperazine (COMPAZINE) 10 MG tablet Take 1 tablet (10 mg total) by mouth every 6 (six) hours as needed for nausea or vomiting. 30 tablet 2   Current Facility-Administered Medications  Medication Dose Route Frequency Provider Last Rate Last Dose  . 0.9 %  sodium chloride infusion  500 mL Intravenous Continuous Nelida Meuse III, MD         ALLERGIES: Review of patient's allergies indicates no known allergies.   LABORATORY DATA:  Lab Results  Component Value Date   WBC 6.1 09/06/2016   HGB 14.4 09/06/2016   HCT 42.8 09/06/2016   MCV 87.2 09/06/2016   PLT 161 09/06/2016   Lab Results  Component Value Date   NA 143 09/06/2016   K 3.9 09/06/2016   CL 108 07/26/2016   CO2 28 09/06/2016   Lab Results  Component Value Date   ALT 12 09/06/2016   AST 12 09/06/2016   ALKPHOS 53 09/06/2016   BILITOT <0.30 09/06/2016     NARRATIVE: [name] was seen today for weekly treatment  management. The chart was checked and the patient's films were reviewed.  Patient has broken skin in three areas between the buttocks. He is using A & D cream after rad tx and prn. He is also using baby wipes prn. He will start sitz bath this weekend. Patient states a good appetite and energy level. He notes regular bowel movements and denies nausea. He notes a 5/10 pain and is taking Xeloda bid.  PHYSICAL EXAMINATION: weight is 152 lb 3.2 oz (69 kg). His oral temperature is 98 F (36.7 C). His blood pressure is 111/73 and his pulse is 62.        ASSESSMENT: The patient is doing satisfactorily with treatment.  PLAN: We will continue with the patient's radiation treatment as planned.        This document serves as a record of services personally performed by Kyung Rudd, MD. It was created on his behalf by Bethann Humble, a trained medical scribe. The creation of this record is based on the scribe's personal observations and the provider's statements to them. This document has been checked and approved by the attending provider.

## 2016-09-10 NOTE — Progress Notes (Addendum)
Weekly rad txs rectum, 18/25 completed, has abroken skin in 3 areas betwwn his buttocks, using a& d  Cream after rad tx and prn,, baby wipes prn, will start sitz bath this weekend appetite good, regular bowel movements, no nausea,  Energy level good stated  pain 5/10 scsle, takes Xeloda bid 11:44 AM BP 111/73 (BP Location: Left Arm, Patient Position: Sitting, Cuff Size: Normal)   Pulse 62   Temp 98 F (36.7 C) (Oral)   Wt 152 lb 3.2 oz (69 kg)   BMI 21.23 kg/m   Wt Readings from Last 3 Encounters:  09/10/16 152 lb 3.2 oz (69 kg)  09/06/16 150 lb 9.6 oz (68.3 kg)  09/01/16 146 lb 11.2 oz (66.5 kg)

## 2016-09-13 ENCOUNTER — Telehealth: Payer: Self-pay | Admitting: Hematology

## 2016-09-13 ENCOUNTER — Ambulatory Visit
Admission: RE | Admit: 2016-09-13 | Discharge: 2016-09-13 | Disposition: A | Discharge status: Home / Self Care | Payer: Medicaid Other | Source: Ambulatory Visit | Attending: Radiation Oncology | Admitting: Radiation Oncology

## 2016-09-13 ENCOUNTER — Other Ambulatory Visit (HOSPITAL_BASED_OUTPATIENT_CLINIC_OR_DEPARTMENT_OTHER): Payer: Medicaid Other

## 2016-09-13 DIAGNOSIS — C20 Malignant neoplasm of rectum: Secondary | ICD-10-CM | POA: Diagnosis present

## 2016-09-13 DIAGNOSIS — Z510 Encounter for antineoplastic radiation therapy: Secondary | ICD-10-CM | POA: Diagnosis not present

## 2016-09-13 LAB — CBC WITH DIFFERENTIAL/PLATELET
BASO%: 0.2 % (ref 0.0–2.0)
Basophils Absolute: 0 10*3/uL (ref 0.0–0.1)
EOS%: 4 % (ref 0.0–7.0)
Eosinophils Absolute: 0.2 10*3/uL (ref 0.0–0.5)
HEMATOCRIT: 45 % (ref 38.4–49.9)
HEMOGLOBIN: 14.9 g/dL (ref 13.0–17.1)
LYMPH#: 0.5 10*3/uL — AB (ref 0.9–3.3)
LYMPH%: 8.2 % — ABNORMAL LOW (ref 14.0–49.0)
MCH: 29.4 pg (ref 27.2–33.4)
MCHC: 33.1 g/dL (ref 32.0–36.0)
MCV: 88.8 fL (ref 79.3–98.0)
MONO#: 0.4 10*3/uL (ref 0.1–0.9)
MONO%: 7.1 % (ref 0.0–14.0)
NEUT%: 80.5 % — ABNORMAL HIGH (ref 39.0–75.0)
NEUTROS ABS: 5 10*3/uL (ref 1.5–6.5)
PLATELETS: 170 10*3/uL (ref 140–400)
RBC: 5.06 10*6/uL (ref 4.20–5.82)
RDW: 17.1 % — ABNORMAL HIGH (ref 11.0–14.6)
WBC: 6.2 10*3/uL (ref 4.0–10.3)

## 2016-09-13 LAB — COMPREHENSIVE METABOLIC PANEL
ALBUMIN: 2.4 g/dL — AB (ref 3.5–5.0)
ALT: 14 U/L (ref 0–55)
ANION GAP: 7 meq/L (ref 3–11)
AST: 15 U/L (ref 5–34)
Alkaline Phosphatase: 56 U/L (ref 40–150)
BUN: 10.5 mg/dL (ref 7.0–26.0)
CALCIUM: 8.6 mg/dL (ref 8.4–10.4)
CO2: 26 meq/L (ref 22–29)
CREATININE: 0.8 mg/dL (ref 0.7–1.3)
Chloride: 109 mEq/L (ref 98–109)
EGFR: >90 mL/min/{1.73_m2} (ref >90)
Glucose: 122 mg/dl (ref 70–140)
Potassium: 4.1 mEq/L (ref 3.5–5.1)
Sodium: 142 mEq/L (ref 136–145)
TOTAL PROTEIN: 4.8 g/dL — AB (ref 6.4–8.3)

## 2016-09-13 LAB — TECHNOLOGIST REVIEW

## 2016-09-13 NOTE — Telephone Encounter (Signed)
Called patient to confirm 9/26 f/u with YF.......... Not able to reach patient by phone or leave message.... Line rings then rings busy. Patient to be informed upon arrival for xrt.

## 2016-09-14 ENCOUNTER — Encounter: Payer: Self-pay | Admitting: Hematology

## 2016-09-14 ENCOUNTER — Ambulatory Visit (HOSPITAL_BASED_OUTPATIENT_CLINIC_OR_DEPARTMENT_OTHER): Payer: Medicaid Other | Admitting: Hematology

## 2016-09-14 ENCOUNTER — Ambulatory Visit
Admission: RE | Admit: 2016-09-14 | Discharge: 2016-09-14 | Disposition: A | Discharge status: Home / Self Care | Payer: Medicaid Other | Source: Ambulatory Visit | Attending: Radiation Oncology | Admitting: Radiation Oncology

## 2016-09-14 VITALS — BP 98/78 | HR 62 | Temp 97.8°F | Resp 18 | Ht 71.0 in | Wt 152.1 lb

## 2016-09-14 DIAGNOSIS — K6289 Other specified diseases of anus and rectum: Secondary | ICD-10-CM

## 2016-09-14 DIAGNOSIS — R918 Other nonspecific abnormal finding of lung field: Secondary | ICD-10-CM | POA: Diagnosis not present

## 2016-09-14 DIAGNOSIS — F411 Generalized anxiety disorder: Secondary | ICD-10-CM

## 2016-09-14 DIAGNOSIS — Z510 Encounter for antineoplastic radiation therapy: Secondary | ICD-10-CM | POA: Diagnosis not present

## 2016-09-14 DIAGNOSIS — F419 Anxiety disorder, unspecified: Secondary | ICD-10-CM

## 2016-09-14 DIAGNOSIS — C20 Malignant neoplasm of rectum: Secondary | ICD-10-CM | POA: Diagnosis not present

## 2016-09-14 NOTE — Progress Notes (Signed)
[place]  [phone] Fax:(336) [phone]  Clinic follow Up Note   Patient Care Team: [name] as PCP - General (General Practice) 09/14/2016  CHIEF COMPLAINTS:  Follow up rectal cancer  Oncology History   Rectal adenocarcinoma [facility])   Staging form: Colon and Rectum, AJCC 7th Edition   - Clinical stage from 07/28/2016: Stage IIIB (T3, N2a, M0) - Signed by Truitt Merle, MD on 08/06/2016      Rectal adenocarcinoma ([place])   07/26/2016 Imaging    CT chest, abdomen and pelvis with contrast showed nodular thickening of the rectal wall, enlarged perirectal lymph nodes. Diverticulosis. Nonspecific low-level lymphadenopathy. Lower lobe emphysema, small 3-4 mm left lower lobe lung nodules.      07/28/2016 Initial Diagnosis    Rectal adenocarcinoma ([place])      07/28/2016 Initial Biopsy    Rectal mass biopsy showed invasive adenocarcinoma, polyps in the ascending colon and rectum showed tubular adenoma       07/28/2016 Procedure    Colonoscopy showed a nearly circumferential mass in the distal rectum, 4 polyps in the descending colon, diverticulosis in the left colon.       07/29/2016 Procedure    Low EUS showed clearly malignant 6 cm long, nearly circumferential mass in the distal rectum, with distal edge located to 1 cm from the annual approach, multiple prior rectal lymph nodes (6) are suspicious ,uT3 N2       08/17/2016 -  Radiation Therapy    Neoadjuvant radiation to his rectal cancer      08/17/2016 -  Chemotherapy    Xeloda 1500 mg twice daily with concurrent irradiation      08/18/2016 Imaging    PET scan showed hypermetabolic rectal mass, perirectal node 8mm with mild increased uptake, no hypermetabolic distant metastasis, small pulmonary nodules are too small to characterize by PET.        HISTORY OF PRESENTING ILLNESS:  [name] 45 y.o. male is here because of His newly diagnosed rectal cancer. He is accompanied by his girlfriend to our  multidisciplinary GI clinic today.  He has had rectal pain for 6 weeks, and mild rectal bleeding with BM, he goes 4 times a day, very little each time, and pencil shaped stool, he has lost 30 lbs so far. His appettie remains to be good, he eats well, no cough, nausea, no abd pain. He has a moderate fatigue, he will 2 tolerate his routine activities including his job without much difficulty. He does not have a primary care physician, and has not seen doctors for many years.  He presents to emergency room on 07/26/2016 for the rectal pain and bleeding, and was referred to gastroenterologist Dr. Loletha Carrow. He underwent colonoscopy on 07/28/2016, which showed 4 polyps in the ascending colon, and a circumferential distal rectal mass. Biopsy of the rectal mass showed adenocarcinoma. He underwent EUS by Dr. Ardis Hughs on 07/29/2016 which showed a T3 N2 disease.   CURRENT THERAPY: Concurrent chemoradiation with Xeloda 1500 mg twice daily, started on 08/17/2016  INTERIM HISTORY  [name] returns for follow-up. He is on week 5 of concurrent chemotherapy and radiation, and tolerating well overall. He takes oxycodone one tablet at night, for his rectal pain, which is overall controlled and the moderate. No other complaints. His appetite and energy level are moderate, he is able to function very well at home. His weight is stable. No other new complaints.  MEDICAL HISTORY:  Past Medical History:  Diagnosis Date  .  Poor dental hygiene        SURGICAL HISTORY: Past Surgical History:  Procedure Laterality Date  . EUS N/A 07/29/2016   Procedure: LOWER ENDOSCOPIC ULTRASOUND (EUS);  Surgeon: Milus Banister, MD;  Location: [facility];  Service: Endoscopy;  Laterality: N/A;  . TOE AMPUTATION Left     SOCIAL HISTORY: Social History   Social History  . Marital status: Divorced    Spouse name: N/A  . Number of children: 1  . Years of education: N/A   Occupational History  . Not on file.   Social History Main  Topics  . Smoking status: Current Every Day Smoker    Packs/day: 1.50    Years: 30.00  . Smokeless tobacco: Never Used  . Alcohol use No  . Drug use:     Types: Marijuana     Comment: patient denies  . Sexual activity: Not on file   Other Topics Concern  . Not on file   Social History Narrative   Lives with significant other, [name]   Have a 86 year old son   Smoker    FAMILY HISTORY: Family History  Problem Relation Age of Onset  . Diabetes Mother   . COPD Father   . Pancreatic cancer Neg Hx   . Esophageal cancer Neg Hx   . Stomach cancer Neg Hx   . Colon cancer Neg Hx   . Liver disease Neg Hx    He lives with his girfriend the their 4 yo son, works for [employer]   ALLERGIES:  has No Known Allergies.  MEDICATIONS:  Current Outpatient Prescriptions  Medication Sig Dispense Refill  . ALPRAZolam (XANAX) 0.5 MG tablet Take 1-2 tablets (0.5-1 mg total) by mouth at bedtime as needed for anxiety. 30 tablet 0  . capecitabine (XELODA) 500 MG tablet Take 3 tablets (1,500 mg total) by mouth 2 (two) times daily after a meal. 180 tablet 0  . docusate sodium (COLACE) 100 MG capsule Take 1 capsule (100 mg total) by mouth every 12 (twelve) hours. 60 capsule 0  . ibuprofen (ADVIL,MOTRIN) 200 MG tablet Take 400 mg by mouth every 6 (six) hours as needed for headache or moderate pain.    Marland Kitchen oxyCODONE (OXY IR/ROXICODONE) 5 MG immediate release tablet Take 1-2 tablets (5-10 mg total) by mouth every 6 (six) hours as needed for severe pain. 90 tablet 0  . prochlorperazine (COMPAZINE) 10 MG tablet Take 1 tablet (10 mg total) by mouth every 6 (six) hours as needed for nausea or vomiting. 30 tablet 2   Current Facility-Administered Medications  Medication Dose Route Frequency Provider Last Rate Last Dose  . 0.9 %  sodium chloride infusion  500 mL Intravenous Continuous Nelida Meuse III, MD        REVIEW OF SYSTEMS:   Constitutional: Denies fevers, chills or abnormal night sweats Eyes:  Denies blurriness of vision, double vision or watery eyes Ears, nose, mouth, throat, and face: Denies mucositis or sore throat Respiratory: Denies cough, dyspnea or wheezes Cardiovascular: Denies palpitation, chest discomfort or lower extremity swelling Gastrointestinal:  Denies nausea, heartburn or change in bowel habits Skin: Denies abnormal skin rashes Lymphatics: Denies new lymphadenopathy or easy bruising Neurological:Denies numbness, tingling or new weaknesses Behavioral/Psych: Mood is stable, no new changes  All other systems were reviewed with the patient and are negative.  PHYSICAL EXAMINATION: ECOG PERFORMANCE STATUS: 1 - Symptomatic but completely ambulatory  Vitals:   09/14/16 1212  BP: 98/78  Pulse: 62  Resp:  18  Temp: 97.8 F (36.6 C)   Filed Weights   09/14/16 1212  Weight: 152 lb 1.6 oz (69 kg)    GENERAL:alert, no distress and comfortable SKIN: skin color, texture, turgor are normal, no rashes or significant lesions EYES: normal, conjunctiva are pink and non-injected, sclera clear OROPHARYNX:no exudate, no erythema and lips, buccal mucosa, and tongue normal  NECK: supple, thyroid normal size, non-tender, without nodularity LYMPH:  no palpable lymphadenopathy in the cervical, axillary or inguinal LUNGS: clear to auscultation and percussion with normal breathing effort HEART: regular rate & rhythm and no murmurs and no lower extremity edema ABDOMEN:abdomen soft, non-tender and normal bowel sounds. Musculoskeletal:no cyanosis of digits and no clubbing  PSYCH: alert & oriented x 3 with fluent speech NEURO: no focal motor/sensory deficits  LABORATORY DATA:  I have reviewed the data as listed CBC Latest Ref Rng & Units 09/13/2016 09/06/2016 08/30/2016  WBC 4.0 - 10.3 10e3/uL 6.2 6.1 6.3  Hemoglobin 13.0 - 17.1 g/dL 14.9 14.4 15.3  Hematocrit 38.4 - 49.9 % 45.0 42.8 44.9  Platelets 140 - 400 10e3/uL 170 161 191   CMP Latest Ref Rng & Units 09/13/2016 09/06/2016  08/30/2016  Glucose 70 - 140 mg/dl 122 69(L) 58(L)  BUN 7.0 - 26.0 mg/dL 10.5 14.0 15.4  Creatinine 0.7 - 1.3 mg/dL 0.8 0.9 0.8  Sodium 136 - 145 mEq/L 142 143 141  Potassium 3.5 - 5.1 mEq/L 4.1 3.9 4.0  Chloride 101 - 111 mmol/L - - -  CO2 22 - 29 mEq/L 26 28 24   Calcium 8.4 - 10.4 mg/dL 8.6 8.7 8.3(L)  Total Protein 6.4 - 8.3 g/dL 4.8(L) 4.4(L) 4.8(L)  Total Bilirubin 0.20 - 1.20 mg/dL <0.30 <0.30 <0.30  Alkaline Phos 40 - 150 U/L 56 53 50  AST 5 - 34 U/L 15 12 11   ALT 0 - 55 U/L 14 12 11    Pathology report  Diagnosis 07/28/2016 1. Surgical [P], descending, polyps(2) -TUBULAR ADENOMAS. -NO HIGH GRADE DYSPLASIA OR MALIGNANCY IDENTIFIED. 2. Surgical [P], rectal mass -INVASIVE ADENOCARCINOMA. -SEE COMMENT. 3. Surgical [P], rectum, polyps(2) -TUBULAR ADENOMAS. -NO HIGH GRADE DYSPLASIA OR MALIGNANCY IDENTIFIED. Microscopic Comment 2. Internal departmental review obtained (Dr. Saralyn Pilar) with agreement. Results are phoned to [facility], nurse in the office of Dr. Loletha Carrow. (MEG 08/03/16)  RADIOGRAPHIC STUDIES: I have personally reviewed the radiological images as listed and agreed with the findings in the report. Nm Pet Image Initial (pi) Skull Base To Thigh  Result Date: 08/18/2016 CLINICAL DATA:  Initial treatment strategy for rectal cancer. EXAM: NUCLEAR MEDICINE PET SKULL BASE TO THIGH TECHNIQUE: 12.17 mCi F-18 FDG was injected intravenously. Full-ring PET imaging was performed from the skull base to thigh after the radiotracer. CT data was obtained and used for attenuation correction and anatomic localization. FASTING BLOOD GLUCOSE:  Value: 80 mg/dl COMPARISON:  08/02/2016 FINDINGS: NECK No hypermetabolic lymph nodes in the neck. CHEST No hypermetabolic mediastinal or hilar nodes. Moderate changes of centrilobular emphysema. A few small scattered pulmonary nodules are identified which are too small to characterize by PET-CT. Index 5 mm left lower lobe lung nodule is identified, image 98 of  series 3. This is too small to characterize by PET-CT. Index perifissural nodule in the left upper lobe measures 6 mm, image 73 of series 3. Index parenchymal nodule in the left upper lobe measures 5 mm, image 77 of series 3. In the right upper lobe anteriorly there is a subpleural nodule measuring 7 mm, image 101 of series 3. No suspicious  pulmonary nodules on the CT scan. ABDOMEN/PELVIS No abnormal hypermetabolic activity within the liver, pancreas, adrenal glands, or spleen. Masslike thickening of the rectum is identified compatible with the clinical history of rectal carcinoma. There is associated increased FDG uptake within SUV max equal to 12.68. Hypermetabolic tumor has a cranial caudal dimension of approximately 4.6 cm. There is a perirectal lymph node on the right posteriorly which measures 7 mm and has an SUV max equal to 3.2, image 232 of series 3. No hypermetabolic lymph nodes in the abdomen or pelvis. SKELETON No focal hypermetabolic activity to suggest skeletal metastasis. IMPRESSION: 1. Intense radiotracer uptake is associated with the masslike thickening involving the rectum compatible with primary rectal carcinoma. 2. Solitary perirectal lymph node measuring 7 mm exhibits mild increased uptake and may reflect local lymph node metastasis. 3. No hypermetabolic distant metastatic disease. 4. Small pulmonary nodules are too small to reliably characterize by PET-CT. The largest is in the right upper lobe anteriorly measuring 7 mm. 5. Emphysema. Electronically Signed   By: Kerby Moors M.D.   On: 08/18/2016 09:42   EUS 07/29/2016 -Clearly malignant 6cm long, nearly circumferential mass in the distal rectum with distal edge located 1cm from the anal verge. If this is rectal adenocarcinoma it is uT3N2a, Stage IIIa.  COLONOSCOPY 07/28/2016 - Rectal mass. - Four 4 mm polyps in the rectum and in the descending colon, removed with a cold snare. Resected and retrieved. - Diverticulosis in the left  colon. - Likely malignant tumor in the distal rectum. Biopsied. Tattooed.   ASSESSMENT & PLAN: 45 year old male without significant past medical history presented with rectal pain and bleeding.  1. 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 He also has a slightly prominent periaortic lymph node, about 8 mm. -I reviewed his PET CT scan findings with patient and his wife, the small lung nodules and periaortic lymph node are not hypermetabolic, no definitive evidence of metastasis on the PET scan. -He is currently on neoadjuvant chemoradiation, tolerating moderate well overall. Lab results reviewed with patient, no significant abnormalities on lab, we'll continue concurrent chemoradiation.  2. Rectal pain  -slightly worse lately with chemo and radiation  -He is taking oxycodone 5-10 mg at night, pain is overall controlled   3. Anxiety  -He feels nervous and anxious since his cancer diagnosis -Continue Xanax as needed  4. Social issues -He is out of work, he wants to apply for Medicaid and disability -He was seen by our Education officer, museum  Plan -Patient is doing well,  lab results reviewed. -continue concurrent chemoradiation with Xeloda 1500 mg twice daily  -I'll see him back next week, he will complete chemoradiation on October 6.   All questions were answered. The patient knows to call the clinic with any problems, questions or concerns. I spent 10 minutes counseling the patient face to face. The total time spent in the appointment was 15 minutes and more than 50% was on counseling.     Truitt Merle, MD 09/14/2016 2:10 PM

## 2016-09-15 ENCOUNTER — Ambulatory Visit (HOSPITAL_BASED_OUTPATIENT_CLINIC_OR_DEPARTMENT_OTHER): Payer: Medicaid Other | Admitting: Genetic Counselor

## 2016-09-15 ENCOUNTER — Telehealth: Payer: Self-pay | Admitting: *Deleted

## 2016-09-15 ENCOUNTER — Encounter: Payer: Self-pay | Admitting: Genetic Counselor

## 2016-09-15 ENCOUNTER — Ambulatory Visit
Admission: RE | Admit: 2016-09-15 | Discharge: 2016-09-15 | Disposition: A | Discharge status: Home / Self Care | Payer: Medicaid Other | Source: Ambulatory Visit | Attending: Radiation Oncology | Admitting: Radiation Oncology

## 2016-09-15 ENCOUNTER — Other Ambulatory Visit: Payer: Medicaid Other

## 2016-09-15 DIAGNOSIS — C20 Malignant neoplasm of rectum: Secondary | ICD-10-CM | POA: Diagnosis not present

## 2016-09-15 DIAGNOSIS — Z315 Encounter for genetic counseling: Secondary | ICD-10-CM

## 2016-09-15 DIAGNOSIS — Z800 Family history of malignant neoplasm of digestive organs: Secondary | ICD-10-CM | POA: Diagnosis not present

## 2016-09-15 DIAGNOSIS — Z510 Encounter for antineoplastic radiation therapy: Secondary | ICD-10-CM | POA: Diagnosis not present

## 2016-09-15 NOTE — Progress Notes (Signed)
REFERRING PROVIDER: Truitt Merle, MD [name], [place] [address]  PRIMARY PROVIDER:  [name]  PRIMARY REASON FOR VISIT:  1. Family history of colon cancer   2. Rectal adenocarcinoma ([place])      HISTORY OF PRESENT ILLNESS:   [name], a 45 y.o. male, [name] seen for a [place] cancer genetics consultation at the request of [name] [name] to a personal and family history of cancer.  [name] presents to clinic today to discuss the possibility of a hereditary predisposition to cancer, genetic testing, and to further clarify his future cancer risks, as well as potential cancer risks for family members.   In 2017, at the age of 40, [name] diagnosed with rectal cancer. This [name] treated with surgery and radiation. MSI/IHC has not been performed.   CANCER HISTORY:  Oncology History   Rectal adenocarcinoma [name])   Staging form: Colon and Rectum, AJCC 7th Edition   - Clinical stage from 07/28/2016: Stage IIIB (T3, N2a, M0) - Signed by Tyrone Merle, MD on 08/06/2016      Rectal adenocarcinoma ([name])   07/26/2016 Imaging    CT chest, abdomen and pelvis with contrast showed nodular thickening of the rectal wall, enlarged perirectal lymph nodes. Diverticulosis. Nonspecific low-level lymphadenopathy. Lower lobe emphysema, small 3-4 mm left lower lobe lung nodules.      07/28/2016 Initial Diagnosis    Rectal adenocarcinoma ([name])      07/28/2016 Initial Biopsy    Rectal mass biopsy showed invasive adenocarcinoma, polyps in the ascending colon and rectum showed tubular adenoma       07/28/2016 Procedure    Colonoscopy showed a nearly circumferential mass in the distal rectum, 4 polyps in the descending colon, diverticulosis in the left colon.       07/29/2016 Procedure    Low EUS showed clearly malignant 6 cm long, nearly circumferential mass in the distal rectum, with distal edge located to 1 cm from the annual approach, multiple prior rectal lymph nodes (6) are suspicious ,uT3 N2        08/17/2016 -  Radiation Therapy    Neoadjuvant radiation to his rectal cancer      08/17/2016 -  Chemotherapy    Xeloda 1500 mg twice daily with concurrent irradiation      08/18/2016 Imaging    PET scan showed hypermetabolic rectal mass, perirectal node 86m with mild increased uptake, no hypermetabolic distant metastasis, small pulmonary nodules are too small to characterize by PET.         RISK FACTORS:  Colonoscopy: yes; rectal cancer found, along with 4 TA polyps. Any excessive radiation exposure in the past:  No Prostate cancer screening: No  Past Medical History:  Diagnosis Date  . Family history of colon cancer   . Poor dental hygiene     Past Surgical History:  Procedure Laterality Date  . EUS N/A 07/29/2016   Procedure: LOWER ENDOSCOPIC ULTRASOUND (EUS);  Surgeon: Tyrone Banister MD;  [facility];  Service: Endoscopy;  Laterality: N/A;  . TOE AMPUTATION Left     Social History   Social History  . Marital status: Divorced    Spouse name: N/A  . Number of children: 3  . Years of education: N/A   Social History Main Topics  . Smoking status: Current Every Day Smoker    Packs/day: 1.50    Years: 30.00    Types: Cigarettes  . Smokeless tobacco: Never Used  . Alcohol  use No  . Drug use:     Types: Marijuana     Comment: patient denies  . Sexual activity: Not Asked   Other Topics Concern  . None   Social History Narrative   Lives with significant other, [name]   Have a 19 year old son   Smoker     FAMILY HISTORY:  We obtained a detailed, 4-generation family history.  Significant diagnoses are listed below: Family History  Problem Relation Age of Onset  . Diabetes Mother   . COPD Father   . Colon cancer Maternal Aunt     dx in her 62s  . Diabetes Maternal Grandmother   . Brain cancer Maternal Grandfather   . Lung cancer Paternal Grandfather   . Colon cancer Cousin     dx in her 21s  . Pancreatic cancer Neg Hx   .  Esophageal cancer Neg Hx   . Stomach cancer Neg Hx   . Liver disease Neg Hx     The patient has two sons and a daughter who are all cancer free.  He has a full brother and a maternal half sister who are cancer free.  His mother died from complications of diabetes and his father died from complications of COPD.  The patient's mother had one sister who had colon cancer in her 40's.  This sister had two children, one who had colon cancer in her 92's.  The patient's maternal grandparents are deceased.  The grandfather died of a brain cancer and the grandmother died of diabetes.  The patient's father had 8 brothers and 7 sisters.  These individuals had smoking related illnesses.  The patient's paternal grandparents are deceased of non cancer related issues.  The patient's maternal ancestors are of Caucasian descent, and paternal ancestors are of [place] descent. There is no reported Ashkenazi Jewish ancestry. There is no known consanguinity.  GENETIC COUNSELING ASSESSMENT: [name] is a 45 y.o. male with a personal history of rectal cancer and family history of colon cancer which is somewhat suggestive of a Lynch syndrome and predisposition to cancer. We, therefore, discussed and recommended the following at today's visit.   DISCUSSION: We discussed that about 5-6% of colon cancer is hereditary with most cases [name] to Lynch syndrome.  Other hereditary colon cancer genes include APC and MUTYH, as well as others.  We reviewed the characteristics, features and inheritance patterns of hereditary cancer syndromes. We also discussed genetic testing, including the appropriate family members to test, the process of testing, insurance coverage and turn-around-time for results. We discussed the implications of a negative, positive and/or variant of uncertain significant result. We recommended [name] pursue genetic testing for the Colorectal cancer gene panel. The Colorectal Cancer Panel offered by [facility]  includes sequencing and/or duplication/deletion testing of the following 19 genes: APC, ATM, AXIN2, BMPR1A, CDH1, CHEK2, EPCAM, MLH1, MSH2, MSH6, MUTYH, PMS2, POLD1, POLE, PTEN, SCG5/GREM1, SMAD4, STK11, and TP53.   Based on Mr. Mcguirt personal and family history of cancer, he meets medical criteria for genetic testing. Despite that he meets criteria, he may still have an out of pocket cost. We discussed that if his out of pocket cost for testing is over $100, the laboratory will call and confirm whether he wants to proceed with testing.  If the out of pocket cost of testing is less than $100 he will be billed by the genetic testing laboratory.   PLAN: After considering the risks, benefits, and limitations, Mr. Juhasz  provided informed consent to pursue genetic testing and the blood sample [name] sent to [facility] for analysis of the Colorectal cancer panel. Results should be available within approximately 2-3 weeks' time, at which point they will be disclosed by telephone to Mr. Hosea, as will any additional recommendations warranted by these results. Mr. Sharron will receive a summary of his genetic counseling visit and a copy of his results once available. This information will also be available in Epic. We encouraged Mr. Biermann to remain in contact with cancer genetics annually so that we can continuously update the family history and inform him of any changes in cancer genetics and testing that may be of benefit for his family. Mr. Thong questions were answered to his satisfaction today. Our contact information [name] provided should additional questions or concerns arise.  Lastly, we encouraged Mr. Botts to remain in contact with cancer genetics annually so that we can continuously update the family history and inform him of any changes in cancer genetics and testing that may be of benefit for this family.   Mr.  Mcglocklin questions were answered to his satisfaction today. Our contact information [name]  provided should additional questions or concerns arise. Thank you for the referral and allowing [place] to share in the care of your patient.   Beauty Pless P. Florene Glen, [place], [facility] Certified Genetic Counselor [email or web address] phone: [phone]  The patient [name] seen for a total of 60 minutes in face-to-face genetic counseling.  This patient [name] discussed with Drs. Magrinat, Lindi Adie and/or [name] who agrees with the above.    _______________________________________________________________________ For Office Staff:  Number of people involved in session: 2 [name] an Intern/ student involved with case: yes

## 2016-09-15 NOTE — Telephone Encounter (Signed)
Oncology Nurse Navigator Documentation  Oncology Nurse Navigator Flowsheets 09/15/2016  Navigator Location [facility]  Navigator Encounter Type Telephone  Telephone Outgoing Call  Abnormal Finding Date -  Confirmed Diagnosis Date -  Treatment Initiated Date -  Patient Visit Type -  Treatment Phase Active Tx  Barriers/Navigation Needs Coordination of Care--referral with Dr. Johney Maine  Education -  Interventions Coordination of Care--added to [facility] on 09/24/16 at 0830 to see Dr. Johney Maine  Referrals -  Coordination of Care Appts--Left message for Shariq to call navigator back to discuss this appointment.  Education Method -  Support Groups/Services -  Acuity Level 1  Time Spent with Patient 15

## 2016-09-16 ENCOUNTER — Telehealth: Payer: Self-pay | Admitting: *Deleted

## 2016-09-16 ENCOUNTER — Ambulatory Visit
Admission: RE | Admit: 2016-09-16 | Discharge: 2016-09-16 | Disposition: A | Discharge status: Home / Self Care | Payer: Medicaid Other | Source: Ambulatory Visit | Attending: Radiation Oncology | Admitting: Radiation Oncology

## 2016-09-16 ENCOUNTER — Encounter: Payer: Self-pay | Admitting: *Deleted

## 2016-09-16 DIAGNOSIS — Z510 Encounter for antineoplastic radiation therapy: Secondary | ICD-10-CM | POA: Diagnosis not present

## 2016-09-16 MED ORDER — CAPECITABINE 500 MG PO TABS
825.0000 mg/m2 | ORAL_TABLET | Freq: Two times a day (BID) | ORAL | 0 refills | Status: DC
Start: 2016-09-16 — End: 2016-09-16

## 2016-09-16 MED ORDER — CAPECITABINE 500 MG PO TABS: 825.0000 mg/m2 | ORAL_TABLET | Freq: Two times a day (BID) | ORAL | 0 refills | Status: DC

## 2016-09-16 MED FILL — CAPECITABINE 500 MG TABLET: 500 | 6 days supply | Qty: 36 | Fill #0

## 2016-09-16 NOTE — Progress Notes (Signed)
Oncology Nurse Navigator Documentation  Oncology Nurse Navigator Flowsheets 09/16/2016  Navigator Location [facility]  Navigator Encounter Type Treatment  Telephone -  Abnormal Finding Date -  Confirmed Diagnosis Date -  Treatment Initiated Date 08/17/2016  Patient Visit Type RadOnc  Treatment Phase Active Tx--RT/Xeloda  Barriers/Navigation Needs Coordination of Care--surgeon visit  Education -  Interventions Coordination of Care--made patient aware he will see Dr. Johney Maine here on 09/24/06 at 0830 in clinic.  Referrals -  Coordination of Care Appts  Education Method -  Support Groups/Services -  Acuity -  Time Spent with Patient 15

## 2016-09-16 NOTE — Telephone Encounter (Signed)
Received call from pt's friend stating that pt needs refill on his chemo pills.  He has 3 left for tonight & doesn't finish chemo until 09/24/16.  Message to Dr Burr Medico for Brandt to fill.

## 2016-09-16 NOTE — Telephone Encounter (Signed)
Per Dr Burr Medico, pt had 30 day supply & should have had enough to get through radiation treatment unless he took over the weekend.  Called & talked with Myriam Jacobson & pt actually started xeloda 1 wk before radiation started b/c he didn't know.  Additional 36 more pills will be e scribed to pharmacy.

## 2016-09-17 ENCOUNTER — Encounter: Payer: Self-pay | Admitting: Radiation Oncology

## 2016-09-17 ENCOUNTER — Ambulatory Visit
Admission: RE | Admit: 2016-09-17 | Discharge: 2016-09-17 | Disposition: A | Discharge status: Home / Self Care | Payer: Medicaid Other | Source: Ambulatory Visit | Attending: Radiation Oncology | Admitting: Radiation Oncology

## 2016-09-17 VITALS — BP 94/65 | HR 66 | Temp 97.6°F | Resp 16 | Wt 148.4 lb

## 2016-09-17 DIAGNOSIS — Z510 Encounter for antineoplastic radiation therapy: Secondary | ICD-10-CM | POA: Diagnosis not present

## 2016-09-17 DIAGNOSIS — C20 Malignant neoplasm of rectum: Secondary | ICD-10-CM

## 2016-09-17 NOTE — Progress Notes (Signed)
Department of Radiation Oncology  Phone:  [phone] Fax:        [phone]  Weekly Treatment Note    Name: [name] Date: 09/17/2016 MRN: [id number] DOB: [birth year]   Diagnosis:     ICD-9-CM ICD-10-CM   1. 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 ALPRAZolam (XANAX) 0.5 MG tablet Take 1-2 tablets (0.5-1 mg total) by mouth at bedtime as needed for anxiety. 30 tablet 0  . capecitabine (XELODA) 500 MG tablet Take 3 tablets (1,500 mg total) by mouth 2 (two) times daily after a meal. 36 tablet 0  . docusate sodium (COLACE) 100 MG capsule Take 1 capsule (100 mg total) by mouth every 12 (twelve) hours. 60 capsule 0  . ibuprofen (ADVIL,MOTRIN) 200 MG tablet Take 400 mg by mouth every 6 (six) hours as needed for headache or moderate pain.    Marland Kitchen oxyCODONE (OXY IR/ROXICODONE) 5 MG immediate release tablet Take 1-2 tablets (5-10 mg total) by mouth every 6 (six) hours as needed for severe pain. 90 tablet 0  . prochlorperazine (COMPAZINE) 10 MG tablet Take 1 tablet (10 mg total) by mouth every 6 (six) hours as needed for nausea or vomiting. 30 tablet 2   Current Facility-Administered Medications  Medication Dose Route Frequency Provider Last Rate Last Dose  . 0.9 %  sodium chloride infusion  500 mL Intravenous Continuous Nelida Meuse III, MD         ALLERGIES: Review of patient's allergies indicates no known allergies.   LABORATORY DATA:  Lab Results  Component Value Date   WBC 6.2 09/13/2016   HGB 14.9 09/13/2016   HCT 45.0 09/13/2016   MCV 88.8 09/13/2016   PLT 170 09/13/2016   Lab Results  Component Value Date   NA 142 09/13/2016   K 4.1 09/13/2016   CL 108 07/26/2016   CO2 26 09/13/2016   Lab Results  Component Value Date   ALT 14 09/13/2016   AST 15 09/13/2016   ALKPHOS 56 09/13/2016   BILITOT <0.30 09/13/2016      NARRATIVE: [name] was seen today for weekly treatment management. The chart was checked and the patient's films were reviewed.  Weekly rad txs rectum, slight irritation, loose stools 3-4x day, using miralax daily, no nausea, drinks 2 ensures daily,  Using baby wipes, and used sitz bath over the weekend,stated"it did help:, fair appetite, good energy level stated 11:56 AM BP 94/65 (BP Location: Left Arm, Patient Position: Sitting, Cuff Size: Normal)   Pulse 66   Temp 97.6 F (36.4 C) (Oral)   Resp 16   Wt 148 lb 6.4 oz (67.3 kg)   BMI 20.70 kg/m   Wt Readings from Last 3 Encounters:  09/17/16 148 lb 6.4 oz (67.3 kg)  09/14/16 152 lb 1.6 oz (69 kg)  09/10/16 152 lb 3.2 oz (69 kg)    PHYSICAL EXAMINATION: weight is 148 lb 6.4 oz (67.3 kg). His oral temperature is 97.6 F (36.4 C). His blood pressure is 94/65 and his pulse is 66. His respiration is 16.        ASSESSMENT: The patient is doing satisfactorily with treatment.  PLAN: We will continue with the patient's radiation treatment as planned. The patient is given a cut back on his use of MiraLAX. Otherwise doing excellent. He is going to see surgery next week to  discuss next steps after chemoradiation treatment.

## 2016-09-17 NOTE — Progress Notes (Signed)
Weekly rad txs rectum, slight irritation, loose stools 3-4x day, using miralax daily, no nausea, drinks 2 ensures daily,  Using baby wipes, and used sitz bath over the weekend,stated"it did help:, fair appetite, good energy level stated 11:47 AM BP 94/65 (BP Location: Left Arm, Patient Position: Sitting, Cuff Size: Normal)   Pulse 66   Temp 97.6 F (36.4 C) (Oral)   Resp 16   Wt 148 lb 6.4 oz (67.3 kg)   BMI 20.70 kg/m   Wt Readings from Last 3 Encounters:  09/17/16 148 lb 6.4 oz (67.3 kg)  09/14/16 152 lb 1.6 oz (69 kg)  09/10/16 152 lb 3.2 oz (69 kg)

## 2016-09-20 ENCOUNTER — Ambulatory Visit
Admission: RE | Admit: 2016-09-20 | Discharge: 2016-09-20 | Disposition: A | Discharge status: Home / Self Care | Payer: Medicaid Other | Source: Ambulatory Visit | Attending: Radiation Oncology | Admitting: Radiation Oncology

## 2016-09-20 DIAGNOSIS — Z510 Encounter for antineoplastic radiation therapy: Secondary | ICD-10-CM | POA: Diagnosis not present

## 2016-09-21 ENCOUNTER — Ambulatory Visit: Payer: Medicaid Other | Admitting: Hematology

## 2016-09-21 ENCOUNTER — Ambulatory Visit
Admission: RE | Admit: 2016-09-21 | Discharge: 2016-09-21 | Disposition: A | Discharge status: Home / Self Care | Payer: Medicaid Other | Source: Ambulatory Visit | Attending: Radiation Oncology | Admitting: Radiation Oncology

## 2016-09-21 DIAGNOSIS — Z510 Encounter for antineoplastic radiation therapy: Secondary | ICD-10-CM | POA: Diagnosis not present

## 2016-09-21 NOTE — Progress Notes (Deleted)
[place]  Telephone:(336) [phone] Fax:([phone]  Clinic follow Up Note   Patient Care Team: [name] as PCP - General (General Practice) Tania Ade, RN as Registered Nurse 09/21/2016  CHIEF COMPLAINTS:  Follow up rectal cancer  Oncology History   Rectal adenocarcinoma [facility])   Staging form: Colon and Rectum, AJCC 7th Edition   - Clinical stage from 07/28/2016: Stage IIIB (T3, N2a, M0) - Signed by Truitt Merle, MD on 08/06/2016      Rectal adenocarcinoma ([place])   07/26/2016 Imaging    CT chest, abdomen and pelvis with contrast showed nodular thickening of the rectal wall, enlarged perirectal lymph nodes. Diverticulosis. Nonspecific low-level lymphadenopathy. Lower lobe emphysema, small 3-4 mm left lower lobe lung nodules.      07/28/2016 Initial Diagnosis    Rectal adenocarcinoma ([place])      07/28/2016 Initial Biopsy    Rectal mass biopsy showed invasive adenocarcinoma, polyps in the ascending colon and rectum showed tubular adenoma       07/28/2016 Procedure    Colonoscopy showed a nearly circumferential mass in the distal rectum, 4 polyps in the descending colon, diverticulosis in the left colon.       07/29/2016 Procedure    Low EUS showed clearly malignant 6 cm long, nearly circumferential mass in the distal rectum, with distal edge located to 1 cm from the annual approach, multiple prior rectal lymph nodes (6) are suspicious ,uT3 N2       08/17/2016 -  Radiation Therapy    Neoadjuvant radiation to his rectal cancer      08/17/2016 -  Chemotherapy    Xeloda 1500 mg twice daily with concurrent irradiation      08/18/2016 Imaging    PET scan showed hypermetabolic rectal mass, perirectal node 15mm with mild increased uptake, no hypermetabolic distant metastasis, small pulmonary nodules are too small to characterize by PET.        HISTORY OF PRESENTING ILLNESS:  [name] 45 y.o. male is here because of His newly diagnosed rectal cancer. He  is accompanied by his girlfriend to our multidisciplinary GI clinic today.  He has had rectal pain for 6 weeks, and mild rectal bleeding with BM, he goes 4 times a day, very little each time, and pencil shaped stool, he has lost 30 lbs so far. His appettie remains to be good, he eats well, no cough, nausea, no abd pain. He has a moderate fatigue, he will 2 tolerate his routine activities including his job without much difficulty. He does not have a primary care physician, and has not seen doctors for many years.  He presents to emergency room on 07/26/2016 for the rectal pain and bleeding, and was referred to gastroenterologist Dr. Loletha Carrow. He underwent colonoscopy on 07/28/2016, which showed 4 polyps in the ascending colon, and a circumferential distal rectal mass. Biopsy of the rectal mass showed adenocarcinoma. He underwent EUS by Dr. Ardis Hughs on 07/29/2016 which showed a T3 N2 disease.   CURRENT THERAPY: Concurrent chemoradiation with Xeloda 1500 mg twice daily, started on 08/17/2016  INTERIM HISTORY  [name] returns for follow-up. He is on week 5 of concurrent chemotherapy and radiation, and tolerating well overall. He takes oxycodone one tablet at night, for his rectal pain, which is overall controlled and the moderate. No other complaints. His appetite and energy level are moderate, he is able to function very well at home. His weight is stable. No other new complaints.  MEDICAL HISTORY:  Past Medical History:  Diagnosis Date  . Family history of colon cancer   . Poor dental hygiene        SURGICAL HISTORY: Past Surgical History:  Procedure Laterality Date  . EUS N/A 07/29/2016   Procedure: LOWER ENDOSCOPIC ULTRASOUND (EUS);  Surgeon: Milus Banister, MD;  Location: [facility];  Service: Endoscopy;  Laterality: N/A;  . TOE AMPUTATION Left     SOCIAL HISTORY: Social History   Social History  . Marital status: Divorced    Spouse name: N/A  . Number of children: 3  . Years of  education: N/A   Occupational History  . Not on file.   Social History Main Topics  . Smoking status: Current Every Day Smoker    Packs/day: 1.50    Years: 30.00    Types: Cigarettes  . Smokeless tobacco: Never Used  . Alcohol use No  . Drug use:     Types: Marijuana     Comment: patient denies  . Sexual activity: Not on file   Other Topics Concern  . Not on file   Social History Narrative   Lives with significant other, [name]   Have a 46 year old son   Smoker    FAMILY HISTORY: Family History  Problem Relation Age of Onset  . Diabetes Mother   . COPD Father   . Colon cancer Maternal Aunt     dx in her 12s  . Diabetes Maternal Grandmother   . Brain cancer Maternal Grandfather   . Lung cancer Paternal Grandfather   . Colon cancer Cousin     dx in her 49s  . Pancreatic cancer Neg Hx   . Esophageal cancer Neg Hx   . Stomach cancer Neg Hx   . Liver disease Neg Hx    He lives with his girfriend the their 75 yo son, works for [employer]   ALLERGIES:  has No Known Allergies.  MEDICATIONS:  Current Outpatient Prescriptions  Medication Sig Dispense Refill  . ALPRAZolam (XANAX) 0.5 MG tablet Take 1-2 tablets (0.5-1 mg total) by mouth at bedtime as needed for anxiety. 30 tablet 0  . capecitabine (XELODA) 500 MG tablet Take 3 tablets (1,500 mg total) by mouth 2 (two) times daily after a meal. 36 tablet 0  . docusate sodium (COLACE) 100 MG capsule Take 1 capsule (100 mg total) by mouth every 12 (twelve) hours. 60 capsule 0  . ibuprofen (ADVIL,MOTRIN) 200 MG tablet Take 400 mg by mouth every 6 (six) hours as needed for headache or moderate pain.    Marland Kitchen oxyCODONE (OXY IR/ROXICODONE) 5 MG immediate release tablet Take 1-2 tablets (5-10 mg total) by mouth every 6 (six) hours as needed for severe pain. 90 tablet 0  . prochlorperazine (COMPAZINE) 10 MG tablet Take 1 tablet (10 mg total) by mouth every 6 (six) hours as needed for nausea or vomiting. 30 tablet 2   Current  Facility-Administered Medications  Medication Dose Route Frequency Provider Last Rate Last Dose  . 0.9 %  sodium chloride infusion  500 mL Intravenous Continuous Nelida Meuse III, MD        REVIEW OF SYSTEMS:   Constitutional: Denies fevers, chills or abnormal night sweats Eyes: Denies blurriness of vision, double vision or watery eyes Ears, nose, mouth, throat, and face: Denies mucositis or sore throat Respiratory: Denies cough, dyspnea or wheezes Cardiovascular: Denies palpitation, chest discomfort or lower extremity swelling Gastrointestinal:  Denies nausea, heartburn or change in bowel habits Skin: Denies  abnormal skin rashes Lymphatics: Denies new lymphadenopathy or easy bruising Neurological:Denies numbness, tingling or new weaknesses Behavioral/Psych: Mood is stable, no new changes  All other systems were reviewed with the patient and are negative.  PHYSICAL EXAMINATION: ECOG PERFORMANCE STATUS: 1 - Symptomatic but completely ambulatory  There were no vitals filed for this visit. There were no vitals filed for this visit.  GENERAL:alert, no distress and comfortable SKIN: skin color, texture, turgor are normal, no rashes or significant lesions EYES: normal, conjunctiva are pink and non-injected, sclera clear OROPHARYNX:no exudate, no erythema and lips, buccal mucosa, and tongue normal  NECK: supple, thyroid normal size, non-tender, without nodularity LYMPH:  no palpable lymphadenopathy in the cervical, axillary or inguinal LUNGS: clear to auscultation and percussion with normal breathing effort HEART: regular rate & rhythm and no murmurs and no lower extremity edema ABDOMEN:abdomen soft, non-tender and normal bowel sounds. Musculoskeletal:no cyanosis of digits and no clubbing  PSYCH: alert & oriented x 3 with fluent speech NEURO: no focal motor/sensory deficits  LABORATORY DATA:  I have reviewed the data as listed CBC Latest Ref Rng & Units 09/13/2016 09/06/2016 08/30/2016    WBC 4.0 - 10.3 10e3/uL 6.2 6.1 6.3  Hemoglobin 13.0 - 17.1 g/dL 14.9 14.4 15.3  Hematocrit 38.4 - 49.9 % 45.0 42.8 44.9  Platelets 140 - 400 10e3/uL 170 161 191   CMP Latest Ref Rng & Units 09/13/2016 09/06/2016 08/30/2016  Glucose 70 - 140 mg/dl 122 69(L) 58(L)  BUN 7.0 - 26.0 mg/dL 10.5 14.0 15.4  Creatinine 0.7 - 1.3 mg/dL 0.8 0.9 0.8  Sodium 136 - 145 mEq/L 142 143 141  Potassium 3.5 - 5.1 mEq/L 4.1 3.9 4.0  Chloride 101 - 111 mmol/L - - -  CO2 22 - 29 mEq/L 26 28 24   Calcium 8.4 - 10.4 mg/dL 8.6 8.7 8.3(L)  Total Protein 6.4 - 8.3 g/dL 4.8(L) 4.4(L) 4.8(L)  Total Bilirubin 0.20 - 1.20 mg/dL <0.30 <0.30 <0.30  Alkaline Phos 40 - 150 U/L 56 53 50  AST 5 - 34 U/L 15 12 11   ALT 0 - 55 U/L 14 12 11    Pathology report  Diagnosis 07/28/2016 1. Surgical [P], descending, polyps(2) -TUBULAR ADENOMAS. -NO HIGH GRADE DYSPLASIA OR MALIGNANCY IDENTIFIED. 2. Surgical [P], rectal mass -INVASIVE ADENOCARCINOMA. -SEE COMMENT. 3. Surgical [P], rectum, polyps(2) -TUBULAR ADENOMAS. -NO HIGH GRADE DYSPLASIA OR MALIGNANCY IDENTIFIED. Microscopic Comment 2. Internal departmental review obtained (Dr. Saralyn Pilar) with agreement. Results are phoned to [facility], nurse in the office of Dr. Loletha Carrow. (MEG 08/03/16)  RADIOGRAPHIC STUDIES: I have personally reviewed the radiological images as listed and agreed with the findings in the report. No results found. EUS 07/29/2016 -Clearly malignant 6cm long, nearly circumferential mass in the distal rectum with distal edge located 1cm from the anal verge. If this is rectal adenocarcinoma it is uT3N2a, Stage IIIa.  COLONOSCOPY 07/28/2016 - Rectal mass. - Four 4 mm polyps in the rectum and in the descending colon, removed with a cold snare. Resected and retrieved. - Diverticulosis in the left colon. - Likely malignant tumor in the distal rectum. Biopsied. Tattooed.   ASSESSMENT & PLAN: 45 year old male without significant past medical history presented with rectal  pain and bleeding.  1. 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 He also has a slightly prominent periaortic lymph node, about 8 mm. -I reviewed his PET CT scan findings with patient and his wife, the small lung nodules and periaortic lymph node are not hypermetabolic, no definitive evidence of metastasis on the PET scan. -He is currently on neoadjuvant chemoradiation, tolerating moderate well overall. Lab results reviewed with patient, no significant abnormalities on lab, we'll continue concurrent chemoradiation.  2. Rectal pain  -slightly worse lately with chemo and radiation  -He is taking oxycodone 5-10 mg at night, pain is overall controlled   3. Anxiety  -He feels nervous and anxious since his cancer diagnosis -Continue Xanax as needed  4. Social issues -He is out of work, he wants to apply for Medicaid and disability -He was seen by our Education officer, museum  Plan -Patient is doing well,  lab results reviewed. -continue concurrent chemoradiation with Xeloda 1500 mg twice daily  -I'll see him back next week, he will complete chemoradiation on October 6.   All questions were answered. The patient knows to call the clinic with any problems, questions or concerns. I spent 10 minutes counseling the patient face to face. The total time spent in the appointment was 15 minutes and more than 50% was on counseling.     Truitt Merle, MD 09/21/2016 6:33 AM

## 2016-09-22 ENCOUNTER — Telehealth: Payer: Self-pay | Admitting: *Deleted

## 2016-09-22 ENCOUNTER — Ambulatory Visit
Admission: RE | Admit: 2016-09-22 | Discharge: 2016-09-22 | Disposition: A | Discharge status: Home / Self Care | Payer: Medicaid Other | Source: Ambulatory Visit | Attending: Radiation Oncology | Admitting: Radiation Oncology

## 2016-09-22 DIAGNOSIS — Z510 Encounter for antineoplastic radiation therapy: Secondary | ICD-10-CM | POA: Diagnosis not present

## 2016-09-22 NOTE — Telephone Encounter (Signed)
Received call from Myriam Jacobson stating that pt missed appt on Monday b/c it wasn't on his schedule.  Informed will discuss with Dr Burr Medico & get r/s.  She states pt has chemo on Friday.  Noted not scheduled.  Note to Dr Burr Medico.

## 2016-09-22 NOTE — Telephone Encounter (Signed)
Called pt & informed that Dr Burr Medico sent order to r/s for next week.  Scheduler should call pt.  Pt is on oral chemotherapy-xeloda.

## 2016-09-23 ENCOUNTER — Ambulatory Visit
Admission: RE | Admit: 2016-09-23 | Discharge: 2016-09-23 | Disposition: A | Discharge status: Home / Self Care | Payer: Medicaid Other | Source: Ambulatory Visit | Attending: Radiation Oncology | Admitting: Radiation Oncology

## 2016-09-23 DIAGNOSIS — Z510 Encounter for antineoplastic radiation therapy: Secondary | ICD-10-CM | POA: Diagnosis not present

## 2016-09-24 ENCOUNTER — Ambulatory Visit
Admission: RE | Admit: 2016-09-24 | Discharge: 2016-09-24 | Disposition: A | Discharge status: Home / Self Care | Payer: Medicaid Other | Source: Ambulatory Visit | Attending: Radiation Oncology | Admitting: Radiation Oncology

## 2016-09-24 ENCOUNTER — Other Ambulatory Visit: Payer: Self-pay | Admitting: *Deleted

## 2016-09-24 ENCOUNTER — Encounter: Payer: Self-pay | Admitting: Surgery

## 2016-09-24 ENCOUNTER — Ambulatory Visit: Payer: Self-pay | Admitting: Surgery

## 2016-09-24 ENCOUNTER — Encounter: Payer: Self-pay | Admitting: *Deleted

## 2016-09-24 VITALS — BP 111/71 | HR 73 | Temp 97.7°F | Resp 18 | Ht 71.0 in | Wt 151.4 lb

## 2016-09-24 DIAGNOSIS — C20 Malignant neoplasm of rectum: Secondary | ICD-10-CM | POA: Diagnosis present

## 2016-09-24 DIAGNOSIS — Z510 Encounter for antineoplastic radiation therapy: Secondary | ICD-10-CM | POA: Diagnosis not present

## 2016-09-24 MED ORDER — SILVER SULFADIAZINE 1 % EX CREA
TOPICAL_CREAM | Freq: Every day | CUTANEOUS | Status: DC
  Administered 2016-09-24: 13:00:00 via TOPICAL

## 2016-09-24 MED ORDER — ALPRAZOLAM 0.5 MG PO TABS: 0.5000 mg | ORAL_TABLET | Freq: Every evening | ORAL | 0 refills | Status: DC | PRN

## 2016-09-24 NOTE — Progress Notes (Signed)
  Radiation Oncology         [phone] ________________________________  Name: EARLIN TIMPSON MRN: [id number]  Date: 09/17/2016  DOB: [birth year]  COMPLEX SIMULATION  NOTE  Diagnosis: rectal cancer  Narrative The patient has initially been planned to receive a course of radiation treatment to a dose of 45 Gy in 25 fractions at 1.8 gray per fraction. The patient will now receive a boost to the high risk target volume for an additional 5.4 Gy. This will be delivered in 3 fractions at 1.8 Gy per fraction and a cone down boost technique will be utilized. To accomplish this, an additional 4 customized blocks have been designed for this purpose. A complex isodose plan is requested to ensure that the high-risk target region receives the appropriate radiation dose and that the nearby normal structures continue to be appropriately spared. The patient's final total dose therefore will be 50.4 Gy.   ________________________________ ------------------------------------------------  Jodelle Gross, MD, PhD

## 2016-09-24 NOTE — Progress Notes (Signed)
Weekly rad txs rectum  28/28 completd, moist desquamation in between buttocks, may need silvadenecream, c/o pain 4/10 scale, hurst when having bowel movement, burns, having 3-4 loose stools daily, on xeloda  Bid, appetite god, ennergy level good, Gave samples aqua phor  11:28 AM BP 111/71 (BP Location: Left Arm, Patient Position: Sitting, Cuff Size: Normal)   Pulse 73   Temp 97.7 F (36.5 C) (Oral)   Resp 18   Ht 5\' 11"  (1.803 m)   Wt 151 lb 6.4 oz (68.7 kg)   BMI 21.12 kg/m   Wt Readings from Last 3 Encounters:  09/24/16 151 lb 6.4 oz (68.7 kg)  09/17/16 148 lb 6.4 oz (67.3 kg)  09/14/16 152 lb 1.6 oz (69 kg)

## 2016-09-24 NOTE — Progress Notes (Signed)
Oncology Nurse Navigator Documentation  Oncology Nurse Navigator Flowsheets 09/24/2016  Navigator Location [facility]  Navigator Encounter Type Clinic/[facility]  Telephone -  Abnormal Finding Date -  Confirmed Diagnosis Date -  Treatment Initiated Date -  Patient Visit Type Surgery  Treatment Phase Active Tx  Barriers/Navigation Needs Education  Education Preparing for Upcoming Surgery- should have surgery mid December. Provided patient with scripts from [facility] and prep for surgery. He will be called by [facility] closer to December for appointment to go over surgery again.  Interventions Education Method--Provided patient smoking cessation materials and class contact information and stressed he will have a better surgical outcome if he stops smoking.  Referrals -  Coordination of Care -  Education Method Verbal;Written  Support Groups/Services -  Acuity -  Time Spent with Patient 15  He declined need to see dietician today and has already seen PT at his first [facility] visit.

## 2016-09-24 NOTE — Progress Notes (Signed)
Department of Radiation Oncology  Phone:  [phone] Fax:        [phone]  Weekly Treatment Note    Name: [name] Date: 09/24/2016 MRN: [id number] DOB: 1971/01/23   Diagnosis:     ICD-9-CM ICD-10-CM   1. 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 capecitabine (XELODA) 500 MG tablet Take 3 tablets (1,500 mg total) by mouth 2 (two) times daily after a meal. 36 tablet 0  . docusate sodium (COLACE) 100 MG capsule Take 1 capsule (100 mg total) by mouth every 12 (twelve) hours. 60 capsule 0  . ibuprofen (ADVIL,MOTRIN) 200 MG tablet Take 400 mg by mouth every 6 (six) hours as needed for headache or moderate pain.    Marland Kitchen oxyCODONE (OXY IR/ROXICODONE) 5 MG immediate release tablet Take 1-2 tablets (5-10 mg total) by mouth every 6 (six) hours as needed for severe pain. 90 tablet 0  . prochlorperazine (COMPAZINE) 10 MG tablet Take 1 tablet (10 mg total) by mouth every 6 (six) hours as needed for nausea or vomiting. 30 tablet 2  . ALPRAZolam (XANAX) 0.5 MG tablet Take 1-2 tablets (0.5-1 mg total) by mouth at bedtime as needed for anxiety. 30 tablet 0   Current Facility-Administered Medications  Medication Dose Route Frequency Provider Last Rate Last Dose  . 0.9 %  sodium chloride infusion  500 mL Intravenous Continuous Nelida Meuse III, MD      . silver sulfADIAZINE (SILVADENE) 1 % cream   Topical Daily Hayden Pedro, PA-C         ALLERGIES: Review of patient's allergies indicates no known allergies.   LABORATORY DATA:  Lab Results  Component Value Date   WBC 6.2 09/13/2016   HGB 14.9 09/13/2016   HCT 45.0 09/13/2016   MCV 88.8 09/13/2016   PLT 170 09/13/2016   Lab Results  Component Value Date   NA 142 09/13/2016   K 4.1 09/13/2016   CL 108 07/26/2016   CO2 26 09/13/2016   Lab Results   Component Value Date   ALT 14 09/13/2016   AST 15 09/13/2016   ALKPHOS 56 09/13/2016   BILITOT <0.30 09/13/2016     NARRATIVE: [name] was seen today for weekly treatment management. The chart was checked and the patient's films were reviewed.  Weekly rad txs rectum  28/28 completd, moist desquamation in between buttocks, may need silvadenecream, c/o pain 4/10 scale, hurst when having bowel movement, burns, having 3-4 loose stools daily, on xeloda  Bid, appetite god, ennergy level good, Gave samples aqua phor  6:21 PM BP 111/71 (BP Location: Left Arm, Patient Position: Sitting, Cuff Size: Normal)   Pulse 73   Temp 97.7 F (36.5 C) (Oral)   Resp 18   Ht 5\' 11"  (1.803 m)   Wt 151 lb 6.4 oz (68.7 kg)   BMI 21.12 kg/m   Wt Readings from Last 3 Encounters:  09/24/16 151 lb 6.4 oz (68.7 kg)  09/17/16 148 lb 6.4 oz (67.3 kg)  09/14/16 152 lb 1.6 oz (69 kg)    PHYSICAL EXAMINATION: height is 5\' 11"  (1.803 m) and weight is 151 lb 6.4 oz (68.7 kg). His oral temperature is 97.7 F (36.5 C). His blood pressure is 111/71 and his pulse is 73. His respiration is 18.  Moist desquamation present in the inter gluteal cleft.  ASSESSMENT: The patient is doing satisfactorily with treatment. The patient finished his final fraction of radiation treatment today. The patient was given Silvadene cream to apply to the affected area. I believe that his skin will heal well within the next one to 2 weeks.  PLAN: The patient will follow-up in our clinic in 1 month.

## 2016-09-29 ENCOUNTER — Telehealth: Payer: Self-pay | Admitting: Hematology

## 2016-09-29 ENCOUNTER — Encounter: Payer: Self-pay | Admitting: Hematology

## 2016-09-29 ENCOUNTER — Encounter: Payer: Self-pay | Admitting: Radiation Oncology

## 2016-09-29 ENCOUNTER — Ambulatory Visit (HOSPITAL_BASED_OUTPATIENT_CLINIC_OR_DEPARTMENT_OTHER): Payer: Medicaid Other | Admitting: Hematology

## 2016-09-29 VITALS — BP 96/79 | HR 62 | Temp 97.7°F | Resp 18 | Ht 71.0 in | Wt 150.6 lb

## 2016-09-29 DIAGNOSIS — R918 Other nonspecific abnormal finding of lung field: Secondary | ICD-10-CM

## 2016-09-29 DIAGNOSIS — F411 Generalized anxiety disorder: Secondary | ICD-10-CM | POA: Diagnosis not present

## 2016-09-29 DIAGNOSIS — F419 Anxiety disorder, unspecified: Secondary | ICD-10-CM

## 2016-09-29 DIAGNOSIS — K6289 Other specified diseases of anus and rectum: Secondary | ICD-10-CM | POA: Diagnosis not present

## 2016-09-29 DIAGNOSIS — C20 Malignant neoplasm of rectum: Secondary | ICD-10-CM

## 2016-09-29 MED ORDER — TRAMADOL HCL 50 MG PO TABS: 50.0000 mg | ORAL_TABLET | Freq: Four times a day (QID) | ORAL | 0 refills | Status: DC | PRN

## 2016-09-29 NOTE — Progress Notes (Signed)
°  Radiation Oncology         [phone] ________________________________  Name: [name] MRN: [id number]  Date: 09/29/2016  DOB: 12-03-1971  End of Treatment Note  Diagnosis:   Rectal adenocarcinoma    Indication for treatment:  Curative      Radiation treatment dates:   08/17/16-09/24/16  Site/dose:   1) Rectum/ 45 Gy in 25 fx           2) Boost/ 5.4 Gy in 3 fx  Beams/energy:   1) 3D / 15X, 6X         2) Isodose Plan/ 15X, 6X  Narrative: The patient tolerated radiation treatment relatively well.   He had symptoms including moist desquamation between buttocks and pain during bowel movements.  Plan: The patient has completed radiation treatment. The patient will return to radiation oncology clinic for routine followup in one month. I advised them to call or return sooner if they have any questions or concerns related to their recovery or treatment.  ------------------------------------------------  Jodelle Gross, MD, PhD  This document serves as a record of services personally performed by Kyung Rudd, MD. It was created on his behalf by Bethann Humble, a trained medical scribe. The creation of this record is based on the scribe's personal observations and the provider's statements to them. This document has been checked and approved by the attending provider.

## 2016-09-29 NOTE — Progress Notes (Addendum)
[name]  Telephone:([phone]) [name] [phone]  Clinic follow Up Note   Patient Care Team: [name] as PCP - General (General Practice) Tania Ade, RN as Registered Nurse Michael Boston, MD as Consulting Physician (General Surgery) Doran Stabler, MD as Consulting Physician (Gastroenterology) Kyung Rudd, MD as Consulting Physician (Radiation Oncology) 09/29/2016  CHIEF COMPLAINTS:  Follow up rectal cancer  Oncology History   Rectal adenocarcinoma [facility])   Staging form: Colon and Rectum, AJCC 7th Edition   - Clinical stage from 07/28/2016: Stage IIIB (T3, N2a, M0) - Signed by Truitt Merle, MD on 08/06/2016      Rectal adenocarcinoma ([place])   07/26/2016 Imaging    CT chest, abdomen and pelvis with contrast showed nodular thickening of the rectal wall, enlarged perirectal lymph nodes. Diverticulosis. Nonspecific low-level lymphadenopathy. Lower lobe emphysema, small 3-4 mm left lower lobe lung nodules.      07/28/2016 Initial Diagnosis    Rectal adenocarcinoma ([place])      07/28/2016 Initial Biopsy    Rectal mass biopsy showed invasive adenocarcinoma, polyps in the ascending colon and rectum showed tubular adenoma       07/28/2016 Procedure    Colonoscopy showed a nearly circumferential mass in the distal rectum, 4 polyps in the descending colon, diverticulosis in the left colon.       07/29/2016 Procedure    Low EUS showed clearly malignant 6 cm long, nearly circumferential mass in the distal rectum, with distal edge located to 1 cm from the annual approach, multiple prior rectal lymph nodes (6) are suspicious ,uT3 N2       08/17/2016 -  Radiation Therapy    Neoadjuvant radiation to his rectal cancer      08/17/2016 -  Chemotherapy    Xeloda 1500 mg twice daily with concurrent irradiation      08/18/2016 Imaging    PET scan showed hypermetabolic rectal mass, perirectal node 75mm with mild increased uptake, no hypermetabolic distant metastasis,  small pulmonary nodules are too small to characterize by PET.        HISTORY OF PRESENTING ILLNESS:  [name] 45 y.o. male is here because of His newly diagnosed rectal cancer. He is accompanied by his girlfriend to our multidisciplinary GI clinic today.  He has had rectal pain for 6 weeks, and mild rectal bleeding with BM, he goes 4 times a day, very little each time, and pencil shaped stool, he has lost 30 lbs so far. His appettie remains to be good, he eats well, no cough, nausea, no abd pain. He has a moderate fatigue, he will 2 tolerate his routine activities including his job without much difficulty. He does not have a primary care physician, and has not seen doctors for many years.  He presents to emergency room on 07/26/2016 for the rectal pain and bleeding, and was referred to gastroenterologist Dr. Loletha Carrow. He underwent colonoscopy on 07/28/2016, which showed 4 polyps in the ascending colon, and a circumferential distal rectal mass. Biopsy of the rectal mass showed adenocarcinoma. He underwent EUS by Dr. Ardis Hughs on 07/29/2016 which showed a T3 N2 disease.   CURRENT THERAPY: Observation  INTERIM HISTORY  [name] returns for follow-up. He completed concurrent chemoradiation last week, and was seen by surgeon Dr. Johney Maine last week, his surgery is scheduled for 12/13. He still has significant pain at the rectal area, with skin ulcers. He has been taking oxycodone 1-2 tablets as needed a few times a day,  but sometimes feels oxycodone does not help him much. He is also doing the skin care as instructed by radiation oncology, no other complaints. His weight is stable, he has good appetite and eating well.  MEDICAL HISTORY:  Past Medical History:  Diagnosis Date  . Family history of colon cancer   . Poor dental hygiene        SURGICAL HISTORY: Past Surgical History:  Procedure Laterality Date  . EUS N/A 07/29/2016   Procedure: LOWER ENDOSCOPIC ULTRASOUND (EUS);  Surgeon: Milus Banister,  MD;  Location: [facility];  Service: Endoscopy;  Laterality: N/A;  . TOE AMPUTATION Left     SOCIAL HISTORY: Social History   Social History  . Marital status: Divorced    Spouse name: N/A  . Number of children: 3  . Years of education: N/A   Occupational History  . Not on file.   Social History Main Topics  . Smoking status: Current Every Day Smoker    Packs/day: 1.50    Years: 30.00    Types: Cigarettes  . Smokeless tobacco: Never Used  . Alcohol use No  . Drug use:     Types: Marijuana     Comment: patient denies  . Sexual activity: Not on file   Other Topics Concern  . Not on file   Social History Narrative   Lives with significant other, [name]   Have a 92 year old son   Smoker      [place] HISTORY: Family History  Problem Relation Age of Onset  . Diabetes Mother   . COPD Father   . Colon cancer Maternal Aunt     dx in her 65s  . Diabetes Maternal Grandmother   . Brain cancer Maternal Grandfather   . Lung cancer Paternal Grandfather   . Colon cancer Cousin     dx in her 15s  . Pancreatic cancer Neg Hx   . Esophageal cancer Neg Hx   . Stomach cancer Neg Hx   . Liver disease Neg Hx    He lives with his girfriend the their 23 yo son, works for [employer]   ALLERGIES:  has No Known Allergies.  MEDICATIONS:  Current Outpatient Prescriptions  Medication Sig Dispense Refill  . ALPRAZolam (XANAX) 0.5 MG tablet Take 1-2 tablets (0.5-1 mg total) by mouth at bedtime as needed for anxiety. 30 tablet 0  . capecitabine (XELODA) 500 MG tablet Take 3 tablets (1,500 mg total) by mouth 2 (two) times daily after a meal. 36 tablet 0  . docusate sodium (COLACE) 100 MG capsule Take 1 capsule (100 mg total) by mouth every 12 (twelve) hours. 60 capsule 0  . ibuprofen (ADVIL,MOTRIN) 200 MG tablet Take 400 mg by mouth every 6 (six) hours as needed for headache or moderate pain.    Marland Kitchen oxyCODONE (OXY IR/ROXICODONE) 5 MG immediate release  tablet Take 1-2 tablets (5-10 mg total) by mouth every 6 (six) hours as needed for severe pain. 90 tablet 0  . prochlorperazine (COMPAZINE) 10 MG tablet Take 1 tablet (10 mg total) by mouth every 6 (six) hours as needed for nausea or vomiting. 30 tablet 2   Current Facility-Administered Medications  Medication Dose Route Frequency Provider Last Rate Last Dose  . 0.9 %  sodium chloride infusion  500 mL Intravenous Continuous Nelida Meuse III, MD        REVIEW OF SYSTEMS:   Constitutional: Denies fevers, chills or abnormal night sweats  Eyes: Denies blurriness of vision, double vision or watery eyes Ears, nose, mouth, throat, and face: Denies mucositis or sore throat Respiratory: Denies cough, dyspnea or wheezes Cardiovascular: Denies palpitation, chest discomfort or lower extremity swelling Gastrointestinal:  Denies nausea, heartburn or change in bowel habits Skin: Denies abnormal skin rashes Lymphatics: Denies new lymphadenopathy or easy bruising Neurological:Denies numbness, tingling or new weaknesses Behavioral/Psych: Mood is stable, no new changes  All other systems were reviewed with the patient and are negative.  PHYSICAL EXAMINATION: ECOG PERFORMANCE STATUS: 1 - Symptomatic but completely ambulatory  Vitals:   09/29/16 0910  BP: 96/79  Pulse: 62  Resp: 18  Temp: 97.7 F (36.5 C)   Filed Weights   09/29/16 0910  Weight: 150 lb 9.6 oz (68.3 kg)    GENERAL:alert, no distress and comfortable SKIN: skin color, texture, turgor are normal, no rashes or significant lesions EYES: normal, conjunctiva are pink and non-injected, sclera clear OROPHARYNX:no exudate, no erythema and lips, buccal mucosa, and tongue normal  NECK: supple, thyroid normal size, non-tender, without nodularity LYMPH:  no palpable lymphadenopathy in the cervical, axillary or inguinal LUNGS: clear to auscultation and percussion with normal breathing effort HEART: regular rate & rhythm and no murmurs and no  lower extremity edema ABDOMEN:abdomen soft, non-tender and normal bowel sounds. There is several skin ulcers in the pareanal area Musculoskeletal:no cyanosis of digits and no clubbing  PSYCH: alert & oriented x 3 with fluent speech NEURO: no focal motor/sensory deficits  LABORATORY DATA:  I have reviewed the data as listed CBC Latest Ref Rng & Units 09/13/2016 09/06/2016 08/30/2016  WBC 4.0 - 10.3 10e3/uL 6.2 6.1 6.3  Hemoglobin 13.0 - 17.1 g/dL 14.9 14.4 15.3  Hematocrit 38.4 - 49.9 % 45.0 42.8 44.9  Platelets 140 - 400 10e3/uL 170 161 191   CMP Latest Ref Rng & Units 09/13/2016 09/06/2016 08/30/2016  Glucose 70 - 140 mg/dl 122 69(L) 58(L)  BUN 7.0 - 26.0 mg/dL 10.5 14.0 15.4  Creatinine 0.7 - 1.3 mg/dL 0.8 0.9 0.8  Sodium 136 - 145 mEq/L 142 143 141  Potassium 3.5 - 5.1 mEq/L 4.1 3.9 4.0  Chloride 101 - 111 mmol/L - - -  CO2 22 - 29 mEq/L 26 28 24   Calcium 8.4 - 10.4 mg/dL 8.6 8.7 8.3(L)  Total Protein 6.4 - 8.3 g/dL 4.8(L) 4.4(L) 4.8(L)  Total Bilirubin 0.20 - 1.20 mg/dL <0.30 <0.30 <0.30  Alkaline Phos 40 - 150 U/L 56 53 50  AST 5 - 34 U/L 15 12 11   ALT 0 - 55 U/L 14 12 11    Pathology report  Diagnosis 07/28/2016 1. Surgical [P], descending, polyps(2) -TUBULAR ADENOMAS. -NO HIGH GRADE DYSPLASIA OR MALIGNANCY IDENTIFIED. 2. Surgical [P], rectal mass -INVASIVE ADENOCARCINOMA. -SEE COMMENT. 3. Surgical [P], rectum, polyps(2) -TUBULAR ADENOMAS. -NO HIGH GRADE DYSPLASIA OR MALIGNANCY IDENTIFIED. Microscopic Comment 2. Internal departmental review obtained (Dr. Saralyn Pilar) with agreement. Results are phoned to [facility], nurse in the office of Dr. Loletha Carrow. (MEG 08/03/16)  RADIOGRAPHIC STUDIES: I have personally reviewed the radiological images as listed and agreed with the findings in the report. No results found. EUS 07/29/2016 -Clearly malignant 6cm long, nearly circumferential mass in the distal rectum with distal edge located 1cm from the anal verge. If this is rectal adenocarcinoma  it is uT3N2a, Stage IIIa.  COLONOSCOPY 07/28/2016 - Rectal mass. - Four 4 mm polyps in the rectum and in the descending colon, removed with a cold snare. Resected and retrieved. - Diverticulosis in the left colon. -  Likely malignant tumor in the distal rectum. Biopsied. Tattooed.   ASSESSMENT & PLAN: 45 year old male without significant past medical history presented with rectal pain and bleeding.  1. 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 He also has a slightly prominent periaortic lymph node, about 8 mm. -I reviewed his PET CT scan findings with patient and his wife, the small lung nodules and periaortic lymph node are not hypermetabolic, no definitive evidence of metastasis on the PET scan. -He has completed chemotherapy and irradiation, tolerated well overall. -His surgery is scheduled for December 13, I plan to see him back before surgery with a restaging CT scan, to give out to his normal nodules and abdominal adenopathy.  2. Rectal pain  -slightly worse lately with chemo and radiation  -He is taking oxycodone 5-10 mg at night, pain is not very well controlled -I given her a prescription of tramadol, 50-100 mg every 8 hours as needed  3. Anxiety  -He feels nervous and anxious since his cancer diagnosis -Continue Xanax as needed  4. Social issues -He is out of work, he wants to apply for Medicaid and disability -He was seen by our Education officer, museum  Plan -pt will see rad/onc next month -Flu shot today -I'll see him back in 8 weeks with lab and a restaging CT chest, abdomen and pelvis with contrast a few days before  All questions were answered. The  patient knows to call the clinic with any problems, questions or concerns. I spent 15 minutes counseling the patient face to face. The total time spent in the appointment was 20 minutes and more than 50% was on counseling.     Truitt Merle, MD 09/29/2016

## 2016-09-29 NOTE — Telephone Encounter (Signed)
GAVE PATIENT AVS REPORT AND APPOINTMENTS FOR October AND November. [facility] WILL CALL PATIENT RE SCAN WHEN ORDER IS ENTERED PATIENT AWARE.

## 2016-09-30 ENCOUNTER — Ambulatory Visit (HOSPITAL_BASED_OUTPATIENT_CLINIC_OR_DEPARTMENT_OTHER): Payer: Medicaid Other

## 2016-09-30 VITALS — BP 102/78 | HR 74 | Temp 97.8°F | Resp 20

## 2016-09-30 DIAGNOSIS — Z23 Encounter for immunization: Secondary | ICD-10-CM | POA: Diagnosis not present

## 2016-09-30 DIAGNOSIS — C20 Malignant neoplasm of rectum: Secondary | ICD-10-CM

## 2016-09-30 MED ORDER — INFLUENZA VAC SPLIT QUAD 0.5 ML IM SUSY
0.5000 mL | PREFILLED_SYRINGE | Freq: Once | INTRAMUSCULAR | Status: AC
  Administered 2016-09-30: 0.5 mL via INTRAMUSCULAR
  Filled 2016-09-30: qty 0.5

## 2016-09-30 NOTE — Patient Instructions (Signed)

## 2016-10-13 ENCOUNTER — Telehealth: Payer: Self-pay | Admitting: Genetic Counselor

## 2016-10-13 ENCOUNTER — Encounter: Payer: Self-pay | Admitting: Genetic Counselor

## 2016-10-13 DIAGNOSIS — Z1509 Genetic susceptibility to other malignant neoplasm: Secondary | ICD-10-CM

## 2016-10-13 DIAGNOSIS — Z1379 Encounter for other screening for genetic and chromosomal anomalies: Secondary | ICD-10-CM | POA: Insufficient documentation

## 2016-10-13 HISTORY — DX: Genetic susceptibility to other malignant neoplasm: Z15.09

## 2016-10-13 NOTE — Telephone Encounter (Signed)
Revealed genetic test results to wife [name].  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  This has implications for his siblings and children.  They will come back on 11/6 at 11 AM.

## 2016-10-19 ENCOUNTER — Telehealth: Payer: Self-pay | Admitting: Gastroenterology

## 2016-10-20 NOTE — Telephone Encounter (Signed)
No documentation 

## 2016-10-21 ENCOUNTER — Telehealth: Payer: Self-pay | Admitting: *Deleted

## 2016-10-21 NOTE — Telephone Encounter (Signed)
Received call from wife Myriam Jacobson requesting a call back from nurse.   Spoke with Myriam Jacobson, and was informed that pt has hemorrhoids size of a nickel.   Pt has had constipation problem, and has to strain with BMs.  Denied bleeding, just red and swollen.  Does have pain at hemorrhoid site.   Dr. Burr Medico notified. Instructed Myriam Jacobson to have pt use Sitz bath to help with discomfort.   Instructed pt to take stool softener, and/or laxative to help with BMs.  Instructed Myriam Jacobson to call surgeon Dr. Marcello Moores to report above problem as per Dr. Ernestina Penna instructions. Myriam Jacobson voiced understanding.

## 2016-10-25 ENCOUNTER — Ambulatory Visit (HOSPITAL_BASED_OUTPATIENT_CLINIC_OR_DEPARTMENT_OTHER): Payer: Medicaid Other | Admitting: Genetic Counselor

## 2016-10-25 DIAGNOSIS — Z1509 Genetic susceptibility to other malignant neoplasm: Secondary | ICD-10-CM | POA: Diagnosis not present

## 2016-10-25 DIAGNOSIS — Z800 Family history of malignant neoplasm of digestive organs: Secondary | ICD-10-CM

## 2016-10-25 DIAGNOSIS — Z801 Family history of malignant neoplasm of trachea, bronchus and lung: Secondary | ICD-10-CM

## 2016-10-25 DIAGNOSIS — Z315 Encounter for genetic counseling: Secondary | ICD-10-CM

## 2016-10-25 DIAGNOSIS — C20 Malignant neoplasm of rectum: Secondary | ICD-10-CM

## 2016-10-25 DIAGNOSIS — Z808 Family history of malignant neoplasm of other organs or systems: Secondary | ICD-10-CM | POA: Diagnosis not present

## 2016-10-25 DIAGNOSIS — Z1379 Encounter for other screening for genetic and chromosomal anomalies: Secondary | ICD-10-CM

## 2016-10-26 ENCOUNTER — Encounter: Payer: Self-pay | Admitting: Genetic Counselor

## 2016-10-26 ENCOUNTER — Other Ambulatory Visit: Payer: Self-pay | Admitting: Hematology

## 2016-10-26 ENCOUNTER — Telehealth: Payer: Self-pay | Admitting: *Deleted

## 2016-10-26 DIAGNOSIS — C20 Malignant neoplasm of rectum: Secondary | ICD-10-CM

## 2016-10-26 NOTE — Telephone Encounter (Signed)
"  I called Pharmacy and they have not received  The Tramadol refill was requested earlier this morning." Confirmed Pharmacy name, location.  Tramadol was called in at 9:40 am.

## 2016-10-26 NOTE — Progress Notes (Signed)
GENETIC TEST RESULTS   Patient Name: [name] Patient Age: 45 y.o. Encounter Date: 10/25/2016  Referring Provider: Truitt Merle, MD  Wilfrid Lund, III, MD  Kyung Rudd, MD  HPI: [name] was previously seen in the [place] clinic due to a family of cancer and concerns regarding a hereditary predisposition to cancer. Please refer to our prior cancer genetics clinic note for more information regarding Mr. Stephanie's medical, social and family histories, and our assessment and recommendations, at the time. [name] recent genetic test results were disclosed to her, as were recommendations warranted by these results. These results and recommendations are discussed in more detail below.   FAMILY HISTORY:  We obtained a detailed, 4-generation family history.  Significant diagnoses are listed below: Family History  Problem Relation Age of Onset  . Diabetes Mother   . COPD Father   . Colon cancer Maternal Aunt     dx in her 85s  . Diabetes Maternal Grandmother   . Brain cancer Maternal Grandfather   . Lung cancer Paternal Grandfather   . Colon cancer Cousin     dx in her 19s  . Bone cancer Sister 8  . Pancreatic cancer Neg Hx   . Esophageal cancer Neg Hx   . Stomach cancer Neg Hx   . Liver disease Neg Hx     The patient has two sons and a daughter who are all cancer free.  He has a full brother and a maternal half sister who are cancer free.  He had one full sister who died of bone cancer at age 14. His mother died from complications of diabetes and his father died from complications of COPD.  The patient's mother had one sister who had colon cancer in her 10's.  This sister had two children, one who had colon cancer in her 56's.  The patient's maternal grandparents are deceased.  The grandfather died of a brain cancer and the grandmother died of diabetes.  The patient's father had 8 brothers and 7 sisters.  These individuals had smoking related illnesses.  The patient's  paternal grandparents are deceased of non cancer related issues.  The patient's maternal ancestors are of Caucasian descent, and paternal ancestors are of [place] descent. There is no reported Ashkenazi Jewish ancestry. There is no known consanguinity.  GENETIC TESTING:  At the time of [name]'s visit, we recommended he pursue genetic testing of the Colorectal Cancer panel.  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  Genetic testing did detect a Variant of Unknown Significance in the PTEN gene called c.-1211C>T. This is a variant upstream of the ATG translational start site in the PTEN promoter region. At this time, it is unknown if this variant is associated with increased cancer risk or if this is a normal finding, but most variants such as this get reclassified to being inconsequential. It should not be used to make medical management decisions. With time, we suspect the lab will determine the significance of this variant, if any. If we do learn more about it, we will try to contact [name] to discuss it further. However, it is important to stay in touch with [place] periodically and keep the  address and phone number up to date.  A copy of the test report has been scanned into Epic for review.   SCREENING RECOMMENDATIONS: We discussed the implications of Lynch syndrome for [name], and discussed who else in the family should have genetic testing. We recommended [name] follow management guidelines for Lynch syndrome; all of which are outlined below. These can be coordinated by [name]'s GI doctor or her primary provider.   1. Annual colonoscopy.    2. While there is no clear evidence to support screening for stomach and small bowel cancer, an upper endoscopy can be considered at 3-5 year intervals beginning at age 31-35. However, whether to have this screening is best determined by the gastroenterologist.   3. Annual urinalysis.   For women with Lynch syndrome, unlike the effective surveillance plan for colorectal cancer risk, there is no professional agreement regarding management for the increased risk of uterine and ovarian cancer. However, we are available to help women and their providers establish an individualized surveillance plan. It is also important for women to understand the following:   1. Women should seek medical attention if they experience abnormal vaginal bleeding.  2. Some providers may still recommend vaginal ultrasounds, uterine biopsies (for uterine cancer risk) and/or CA-125 analysis ( for ovarian cancer risk), even though these have not been shown to be effective.  3. A hysterectomy with removal of the ovaries and fallopian tubes should be considered once childbearing is completed (if planned).  FAMILY MEMBERS: Since we now know the mutation in [name], we can test at-risk relatives to determine whether or not they have inherited the mutation and are at increased risk for cancer. [name] children and siblings have a 50% risk to have inherited this mutation.  We also recommend that his maternal aunt and cousins undergo genetic testing.  We will be happy to meet with any of the family members or refer them to a genetic counselor in their local area. To locate genetic counselors in other cities, individuals can visit the website of the [employer] ([employer]) and Secretary/administrator for a Social worker by zip code.   We strongly encouraged [name] to remain in contact with [place] in cancer genetics on an annual basis so we can update [name] personal and family histories, and inform him of advances in  cancer genetics that may be of benefit for the entire family. [name] knows he is also welcome to call with any questions or concerns, at any time.   Karen P. Florene Glen, [place], [facility]  Certified [employer]  [phone]

## 2016-10-27 ENCOUNTER — Telehealth: Payer: Self-pay | Admitting: Gastroenterology

## 2016-10-27 NOTE — Telephone Encounter (Signed)
Called patient, only was able to reach his wife. She states he is doing ok, however, he has what appears to be a hemorrhoid that is not responding to OTC Preparation H. She was wondering if it was the tumor? I have asked her to call the surgeon's office as he has up in coming surgery with them, to evaluate. I asked that if she cannot get in to see them to let me know and would see about an appointment here. I did make a follow up appointment here for middle of January.

## 2016-10-27 NOTE — Telephone Encounter (Signed)
Gregary Signs,    Please call Mr Ohler for me.  I would like to let him know that I am thinking of him and hope he is getting through his rectal cancer treatment reasonably well.  I have been communicating with his surgeon, oncologist and genetic counselor over the last few weeks and see that he has the Lynch syndrome mutation.  I know that the surgeon will be having further discussions with him about that.   Patient's with this genetic condition may be at increased risk of stomach cancer, and it is somewhat controversial if they should be screened for that.  I feel it is a good idea, especially at his age.  An upper endoscopy would be the test to do.  However, it is not urgent, and I do not think now is a good time since he is going through chemotherapy and radiation with surgery planned.  I merely did not want to lose track of him with everything else going on.    I think the best thing would be to have him see me in the office in January or February , after he has had time to recover from his surgery.  Please make the arrangements and wish him well for me.

## 2016-10-27 NOTE — Telephone Encounter (Signed)
Given how low in the rectum that tumor was when we found it, I suspect that it may be the tumor.  Even if I saw him and could determine that, I am afraid there will not be anything I can do about that. Yes, I think he would be best served by seeing the surgeon so they can see it because they are trying to gauge the response to chemo and radiation in order to time his surgery.

## 2016-11-08 NOTE — Progress Notes (Addendum)
Weight and vital sign are.  Here for a one month follow up appointment for rectal adenocarcinoma.  Skin changes:No rectal irritation Appetite:Good Bowel issues:Having constipation taking Colace and Miralax helping to regulate bowel movements.  Having a XI Robotic assisted lower anterior resection 12-01-16 by Dr. Johney Maine Fatigue:None Wt Readings from Last 3 Encounters:  11/09/16 158 lb 3.2 oz (71.8 kg)  09/29/16 150 lb 9.6 oz (68.3 kg)  09/24/16 151 lb 6.4 oz (68.7 kg)  Gained 8.6 .4 oz since last visit. BP 119/75   Pulse 76   Temp 97.9 F (36.6 C) (Oral)   Resp 18   Ht 5\' 11"  (1.803 m)   Wt 158 lb 3.2 oz (71.8 kg)   SpO2 100%   BMI 22.06 kg/m

## 2016-11-09 ENCOUNTER — Ambulatory Visit
Admission: RE | Admit: 2016-11-09 | Discharge: 2016-11-09 | Disposition: A | Discharge status: Home / Self Care | Payer: Medicaid Other | Source: Ambulatory Visit | Attending: Radiation Oncology | Admitting: Radiation Oncology

## 2016-11-09 ENCOUNTER — Encounter: Payer: Self-pay | Admitting: Radiation Oncology

## 2016-11-09 VITALS — BP 119/75 | HR 76 | Temp 97.9°F | Resp 18 | Ht 71.0 in | Wt 158.2 lb

## 2016-11-09 DIAGNOSIS — R197 Diarrhea, unspecified: Secondary | ICD-10-CM | POA: Diagnosis not present

## 2016-11-09 DIAGNOSIS — Z1509 Genetic susceptibility to other malignant neoplasm: Secondary | ICD-10-CM | POA: Insufficient documentation

## 2016-11-09 DIAGNOSIS — C20 Malignant neoplasm of rectum: Secondary | ICD-10-CM | POA: Diagnosis present

## 2016-11-09 DIAGNOSIS — Z79899 Other long term (current) drug therapy: Secondary | ICD-10-CM | POA: Insufficient documentation

## 2016-11-09 NOTE — Progress Notes (Signed)
Radiation Oncology         [phone] ________________________________  Name: [name] MRN: [id number]  Date: 11/09/2016  DOB: 12/27/1970  Post Treatment Note  CC: [name]  Tyrone Banister, MD  Diagnosis:   Stage IIB, T3, N2a, M0 adenocarcinoma of the rectum  Interval Since Last Radiation:  7 weeks   08/17/16-09/24/16 1. Rectum/ 45 Gy in 25 fx 2. Boost/ 5.4 Gy in 3 fx  Narrative:  The patient returns today for routine follow-up. [name] has done well since completing radiotherapy. During the course of treatment, he did have some trouble with rectal pain and diarrhea. He originally presented with episodes of rectal bleeding. Since his last visit with [place], however robotic assisted LAR with Dr. Johney [name] on 12/01/16. He denies any abdominal pain, nausea, or vomiting, and denies any rectal bleeding at this time. He has actually gained 8 pounds in The last month and a half and is quite pleased. No other complaints or verbalized.                       ALLERGIES:  has No Known Allergies.  Meds: Current Outpatient Prescriptions  Medication Sig Dispense Refill  . docusate sodium (COLACE) 100 MG capsule Take 1 capsule (100 mg total) by mouth every 12 (twelve) hours. 60 capsule 0  . ibuprofen (ADVIL,MOTRIN) 200 MG tablet Take 400 mg by mouth every 6 (six) hours as needed for headache or moderate pain.    . polyethylene glycol (MIRALAX / GLYCOLAX) packet Take 17 g by mouth daily as needed.    . prochlorperazine (COMPAZINE) 10 MG tablet Take 1 tablet (10 mg total) by mouth every 6 (six) hours as needed for nausea or vomiting. 30 tablet 2  . traMADol (ULTRAM) 50 MG tablet Take 1-2 tablets (50-100 mg total) by mouth every 6 (six) hours as needed. 60 tablet 0  . ALPRAZolam (XANAX) 0.5 MG tablet Take 1-2 tablets (0.5-1 mg total) by mouth at bedtime as needed for anxiety. (Patient not taking: Reported on 11/09/2016) 30 tablet 0  . oxyCODONE (OXY IR/ROXICODONE) 5 MG immediate release tablet  Take 1-2 tablets (5-10 mg total) by mouth every 6 (six) hours as needed for severe pain. (Patient not taking: Reported on 11/09/2016) 90 tablet 0   Current Facility-Administered Medications  Medication Dose Route Frequency Provider Last Rate Last Dose  . 0.9 %  sodium chloride infusion  500 mL Intravenous Continuous Tyrone Meuse III, MD        Physical Findings:  height is '5\' 11"'$  (1.803 m) and weight is 158 lb 3.2 oz (71.8 kg). His oral temperature is 97.9 F (36.6 C). His blood pressure is 119/75 and his pulse is 76. His respiration is 18 and oxygen saturation is 100%.  In general this is a well appearing gentleman of native [place] heritage in no acute distress. He's alert and oriented x4 and appropriate throughout the examination. Cardiopulmonary assessment is negative for acute distress and he exhibits normal effort.   Lab Findings: Lab Results  Component Value Date   WBC 6.2 09/13/2016   HGB 14.9 09/13/2016   HCT 45.0 09/13/2016   MCV 88.8 09/13/2016   PLT 170 09/13/2016     Radiographic Findings: No results found.  Impression/Plan: 1. Stage IIB, T3, N2a, M0 adenocarcinoma of the rectum. The patient has done well since completion of his radiotherapy. He will move forward with Dr. Johney [name] on 12/01/16 with a robotic LAR. We  will plan to see the patient in 6 month's time for continued evaluation, and he will contact us sooner with questions or concerns regarding his previous treatment. 2. Lynch Syndrome. The patient has met with genetic counseling and this diagnosis has been confirmed. He will begin additional screening, and his family members will move forward with their own testing as well. Given this diagnosis he is planning to have complete colectomy as a result.    [name], PAC

## 2016-11-09 NOTE — Addendum Note (Signed)
Encounter addended by: Malena Edman, RN on: 11/09/2016  2:55 PM<BR>    Actions taken: Charge Capture section accepted

## 2016-11-10 ENCOUNTER — Telehealth: Payer: Self-pay | Admitting: *Deleted

## 2016-11-10 NOTE — Telephone Encounter (Signed)
CALLED PATIENT TO INFORM OF FU VISIT WITH ALISON PERKINS ON 05/17/17 @ 10 AM, SPOKE WITH PATIENT'S WIFE- CASEY AND SHE IS AWARE OF THIS APPT.

## 2016-11-22 ENCOUNTER — Ambulatory Visit (HOSPITAL_COMMUNITY): Payer: Medicaid Other

## 2016-11-22 NOTE — Patient Instructions (Signed)
[name]  11/22/2016   Your procedure is scheduled on: Wednesday 12/01/2016  Report to [facility] Main  Entrance take [place]  elevators to 3rd floor to  [place] at  [place]  AM.  Call this number if you have problems the morning of surgery [phone]   Remember: ONLY 1 PERSON MAY GO WITH YOU TO SHORT STAY TO GET  READY MORNING OF [place].               FOLLOW BOWEL PREP INSTRUCTIONS FROM DR. GROSS OFFICE ALONG WITH A CLEAR LIQUID DIET THE DAY BEFORE SURGERY ON Tuesday 11/30/2016!       CLEAR LIQUID DIET   Foods Allowed                                                                     Foods Excluded  Coffee and tea, regular and decaf                             liquids that you cannot  Plain Jell-O in any flavor                                             see through such as: Fruit ices (not with fruit pulp)                                     milk, soups, orange juice  Iced Popsicles                                    All solid food Carbonated beverages, regular and diet                                    Cranberry, grape and apple juices Sports drinks like Gatorade Lightly seasoned clear broth or consume(fat free) Sugar, honey syrup  Sample Menu Breakfast                                Lunch                                     Supper Cranberry juice                    Beef broth                            Chicken broth Jell-O  Grape juice                           Apple juice Coffee or tea                        Jell-O                                      Popsicle                                                Coffee or tea                        Coffee or tea  _____________________________________________________________________     Do not eat food or drink liquids :After Midnight.     Take these medicines the morning of surgery with A SIP OF WATER: none                                 You  may not have any metal on your body including hair pins and              piercings  Do not wear jewelry, make-up, lotions, powders or perfumes, deodorant             Do not wear nail polish.  Do not shave  48 hours prior to surgery.              Men may shave face and neck.   Do not bring valuables to the hospital. [place].  Contacts, dentures or bridgework may not be worn into surgery.  Leave suitcase in the car. After surgery it may be brought to your room.                 Please read over the following fact sheets you were given: _____________________________________________________________________             [facility] - Preparing for Surgery Before surgery, you can play an important role.  Because skin is not sterile, your skin needs to be as free of germs as possible.  You can reduce the number of germs on your skin by washing with CHG (chlorahexidine gluconate) soap before surgery.  CHG is an antiseptic cleaner which kills germs and bonds with the skin to continue killing germs even after washing. Please DO NOT use if you have an allergy to CHG or antibacterial soaps.  If your skin becomes reddened/irritated stop using the CHG and inform your nurse when you arrive at Short Stay. Do not shave (including legs and underarms) for at least 48 hours prior to the first CHG shower.  You may shave your face/neck. Please follow these instructions carefully:  1.  Shower with CHG Soap the night before surgery and the  morning of Surgery.  2.  If you choose to wash your hair, wash your hair first as usual with your  normal  shampoo.  3.  After you shampoo, rinse your hair and body  thoroughly to remove the  shampoo.                           4.  Use CHG as you would any other liquid soap.  You can apply chg directly  to the skin and wash                       Gently with a scrungie or clean washcloth.  5.  Apply the CHG Soap to your body ONLY  FROM THE NECK DOWN.   Do not use on face/ open                           Wound or open sores. Avoid contact with eyes, ears mouth and genitals (private parts).                       Wash face,  Genitals (private parts) with your normal soap.             6.  Wash thoroughly, paying special attention to the area where your surgery  will be performed.  7.  Thoroughly rinse your body with warm water from the neck down.  8.  DO NOT shower/wash with your normal soap after using and rinsing off  the CHG Soap.                9.  Pat yourself dry with a clean towel.            10.  Wear clean pajamas.            11.  Place clean sheets on your bed the night of your first shower and do not  sleep with pets. Day of Surgery : Do not apply any lotions/deodorants the morning of surgery.  Please wear clean clothes to the hospital/surgery center.  FAILURE TO FOLLOW THESE INSTRUCTIONS MAY RESULT IN THE CANCELLATION OF YOUR SURGERY PATIENT SIGNATURE_________________________________  NURSE SIGNATURE__________________________________  ________________________________________________________________________  WHAT IS A BLOOD TRANSFUSION? Blood Transfusion Information  A transfusion is the replacement of blood or some of its parts. Blood is made up of multiple cells which provide different functions.  Red blood cells carry oxygen and are used for blood loss replacement.  White blood cells fight against infection.  Platelets control bleeding.  Plasma helps clot blood.  Other blood products are available for specialized needs, such as hemophilia or other clotting disorders. BEFORE THE TRANSFUSION  Who gives blood for transfusions?   Healthy volunteers who are fully evaluated to make sure their blood is safe. This is blood bank blood. Transfusion therapy is the safest it has ever been in the practice of medicine. Before blood is taken from a donor, a complete history is taken to make sure that person has  no history of diseases nor engages in risky social behavior (examples are intravenous drug use or sexual activity with multiple partners). The donor's travel history is screened to minimize risk of transmitting infections, such as malaria. The donated blood is tested for signs of infectious diseases, such as HIV and hepatitis. The blood is then tested to be sure it is compatible with you in order to minimize the chance of a transfusion reaction. If you or a relative donates blood, this is often done in anticipation of surgery and is not appropriate for emergency situations. It takes many days to process the  donated blood. RISKS AND COMPLICATIONS Although transfusion therapy is very safe and saves many lives, the main dangers of transfusion include:   Getting an infectious disease.  Developing a transfusion reaction. This is an allergic reaction to something in the blood you were given. Every precaution is taken to prevent this. The decision to have a blood transfusion has been considered carefully by your caregiver before blood is given. Blood is not given unless the benefits outweigh the risks. AFTER THE TRANSFUSION  Right after receiving a blood transfusion, you will usually feel much better and more energetic. This is especially true if your red blood cells have gotten low (anemic). The transfusion raises the level of the red blood cells which carry oxygen, and this usually causes an energy increase.  The nurse administering the transfusion will monitor you carefully for complications. HOME CARE INSTRUCTIONS  No special instructions are needed after a transfusion. You may find your energy is better. Speak with your caregiver about any limitations on activity for underlying diseases you may have. SEEK MEDICAL CARE IF:   Your condition is not improving after your transfusion.  You develop redness or irritation at the intravenous (IV) site. SEEK IMMEDIATE MEDICAL CARE IF:  Any of the following  symptoms occur over the next 12 hours:  Shaking chills.  You have a temperature by mouth above 102 F (38.9 C), not controlled by medicine.  Chest, back, or muscle pain.  People around you feel you are not acting correctly or are confused.  Shortness of breath or difficulty breathing.  Dizziness and fainting.  You get a rash or develop hives.  You have a decrease in urine output.  Your urine turns a dark color or changes to pink, red, or brown. Any of the following symptoms occur over the next 10 days:  You have a temperature by mouth above 102 F (38.9 C), not controlled by medicine.  Shortness of breath.  Weakness after normal activity.  The white part of the eye turns yellow (jaundice).  You have a decrease in the amount of urine or are urinating less often.  Your urine turns a dark color or changes to pink, red, or brown. Document Released: 12/03/2000 Document Revised: 02/28/2012 Document Reviewed: 07/22/2008 [facility] Patient Information 2014 [place], [place].  _______________________________________________________________________

## 2016-11-23 ENCOUNTER — Ambulatory Visit (HOSPITAL_COMMUNITY): Payer: Medicaid Other

## 2016-11-24 ENCOUNTER — Ambulatory Visit: Payer: Medicaid Other | Admitting: Hematology

## 2016-11-24 ENCOUNTER — Other Ambulatory Visit: Payer: Medicaid Other

## 2016-11-24 ENCOUNTER — Encounter (HOSPITAL_COMMUNITY): Payer: Self-pay

## 2016-11-24 ENCOUNTER — Encounter (HOSPITAL_COMMUNITY)
Admission: RE | Admit: 2016-11-24 | Discharge: 2016-11-24 | Disposition: A | Discharge status: Home / Self Care | Payer: Medicaid Other | Source: Ambulatory Visit | Attending: Surgery | Admitting: Surgery

## 2016-11-24 DIAGNOSIS — C20 Malignant neoplasm of rectum: Secondary | ICD-10-CM | POA: Insufficient documentation

## 2016-11-24 DIAGNOSIS — Z01812 Encounter for preprocedural laboratory examination: Secondary | ICD-10-CM | POA: Diagnosis present

## 2016-11-24 LAB — BASIC METABOLIC PANEL
Anion gap: 4 — ABNORMAL LOW (ref 5–15)
BUN: 18 mg/dL (ref 6–20)
CHLORIDE: 109 mmol/L (ref 101–111)
CO2: 26 mmol/L (ref 22–32)
CREATININE: 0.9 mg/dL (ref 0.61–1.24)
Calcium: 7.9 mg/dL — ABNORMAL LOW (ref 8.9–10.3)
GFR calc Af Amer: >60 mL/min (ref >60)
GFR calc non Af Amer: >60 mL/min (ref >60)
GLUCOSE: 86 mg/dL (ref 65–99)
Potassium: 3.9 mmol/L (ref 3.5–5.1)
Sodium: 139 mmol/L (ref 135–145)

## 2016-11-24 LAB — CBC
HCT: 43.9 % (ref 39.0–52.0)
Hemoglobin: 15 g/dL (ref 13.0–17.0)
MCH: 30.9 pg (ref 26.0–34.0)
MCHC: 34.2 g/dL (ref 30.0–36.0)
MCV: 90.3 fL (ref 78.0–100.0)
PLATELETS: 228 10*3/uL (ref 150–400)
RBC: 4.86 MIL/uL (ref 4.22–5.81)
RDW: 15.1 % (ref 11.5–15.5)
WBC: 7.5 10*3/uL (ref 4.0–10.5)

## 2016-11-24 LAB — ABO/RH: ABO/RH(D): A POS

## 2016-11-24 NOTE — Progress Notes (Deleted)
[place]  [phone] [phone]  Clinic follow Up Note   Patient Care Team: [name] as PCP - General (General Practice) Tania Ade, RN as Registered Nurse Michael Boston, MD as Consulting Physician (General Surgery) Doran Stabler, MD as Consulting Physician (Gastroenterology) Kyung Rudd, MD as Consulting Physician (Radiation Oncology) 11/24/2016  CHIEF COMPLAINTS:  Follow up rectal cancer  Oncology History   Rectal adenocarcinoma [facility])   Staging form: Colon and Rectum, AJCC 7th Edition   - Clinical stage from 07/28/2016: Stage IIIB (T3, N2a, M0) - Signed by Truitt Merle, MD on 08/06/2016      Rectal adenocarcinoma ([place])   07/26/2016 Imaging    CT chest, abdomen and pelvis with contrast showed nodular thickening of the rectal wall, enlarged perirectal lymph nodes. Diverticulosis. Nonspecific low-level lymphadenopathy. Lower lobe emphysema, small 3-4 mm left lower lobe lung nodules.      07/28/2016 Initial Diagnosis    Rectal adenocarcinoma ([place])      07/28/2016 Initial Biopsy    Rectal mass biopsy showed invasive adenocarcinoma, polyps in the ascending colon and rectum showed tubular adenoma       07/28/2016 Procedure    Colonoscopy showed a nearly circumferential mass in the distal rectum, 4 polyps in the descending colon, diverticulosis in the left colon.       07/29/2016 Procedure    Low EUS showed clearly malignant 6 cm long, nearly circumferential mass in the distal rectum, with distal edge located to 1 cm from the annual approach, multiple prior rectal lymph nodes (6) are suspicious ,uT3 N2       08/17/2016 -  Radiation Therapy    Neoadjuvant radiation to his rectal cancer      08/17/2016 -  Chemotherapy    Xeloda 1500 mg twice daily with concurrent irradiation      08/18/2016 Imaging    PET scan showed hypermetabolic rectal mass, perirectal node 46mm with mild increased uptake, no hypermetabolic distant metastasis,  small pulmonary nodules are too small to characterize by PET.        HISTORY OF PRESENTING ILLNESS:  [name] 45 y.o. male is here because of His newly diagnosed rectal cancer. He is accompanied by his girlfriend to our multidisciplinary GI clinic today.  He has had rectal pain for 6 weeks, and mild rectal bleeding with BM, he goes 4 times a day, very little each time, and pencil shaped stool, he has lost 30 lbs so far. His appettie remains to be good, he eats well, no cough, nausea, no abd pain. He has a moderate fatigue, he will 2 tolerate his routine activities including his job without much difficulty. He does not have a primary care physician, and has not seen doctors for many years.  He presents to emergency room on 07/26/2016 for the rectal pain and bleeding, and was referred to gastroenterologist Dr. Loletha Carrow. He underwent colonoscopy on 07/28/2016, which showed 4 polyps in the ascending colon, and a circumferential distal rectal mass. Biopsy of the rectal mass showed adenocarcinoma. He underwent EUS by Dr. Ardis Hughs on 07/29/2016 which showed a T3 N2 disease.   CURRENT THERAPY: Observation  INTERIM HISTORY  [name] returns for follow-up. He completed concurrent chemoradiation last week, and was seen by surgeon Dr. Johney Maine last week, his surgery is scheduled for 12/13. He still has significant pain at the rectal area, with skin ulcers. He has been taking oxycodone 1-2 tablets as needed a few times a day,  but sometimes feels oxycodone does not help him much. He is also doing the skin care as instructed by radiation oncology, no other complaints. His weight is stable, he has good appetite and eating well.  MEDICAL HISTORY:  Past Medical History:  Diagnosis Date  . Family history of colon cancer   . Poor dental hygiene        SURGICAL HISTORY: Past Surgical History:  Procedure Laterality Date  . EUS N/A 07/29/2016   Procedure: LOWER ENDOSCOPIC ULTRASOUND (EUS);  Surgeon: Milus Banister,  MD;  Location: [facility];  Service: Endoscopy;  Laterality: N/A;  . TOE AMPUTATION Left     SOCIAL HISTORY: Social History   Social History  . Marital status: Married    Spouse name: N/A  . Number of children: 3  . Years of education: N/A   Occupational History  . Not on file.   Social History Main Topics  . Smoking status: Current Every Day Smoker    Packs/day: 1.50    Years: 30.00    Types: Cigarettes  . Smokeless tobacco: Never Used  . Alcohol use No  . Drug use:     Types: Marijuana     Comment: patient denies  . Sexual activity: Not on file   Other Topics Concern  . Not on file   Social History Narrative   Lives with significant other, [name]   Have a 42 year old son   Smoker      [place] HISTORY: Family History  Problem Relation Age of Onset  . Diabetes Mother   . COPD Father   . Colon cancer Maternal Aunt     dx in her 71s  . Diabetes Maternal Grandmother   . Brain cancer Maternal Grandfather   . Lung cancer Paternal Grandfather   . Colon cancer Cousin     dx in her 72s  . Bone cancer Sister 8  . Pancreatic cancer Neg Hx   . Esophageal cancer Neg Hx   . Stomach cancer Neg Hx   . Liver disease Neg Hx    He lives with his girfriend the their 2 yo son, works for [employer]   ALLERGIES:  has No Known Allergies.  MEDICATIONS:  Current Outpatient Prescriptions  Medication Sig Dispense Refill  . ALPRAZolam (XANAX) 0.5 MG tablet Take 1-2 tablets (0.5-1 mg total) by mouth at bedtime as needed for anxiety. (Patient taking differently: Take 0.5-1 mg by mouth daily as needed for anxiety. For anxiety) 30 tablet 0  . docusate sodium (COLACE) 100 MG capsule Take 1 capsule (100 mg total) by mouth every 12 (twelve) hours. (Patient taking differently: Take 100 mg by mouth daily. ) 60 capsule 0  . naproxen sodium (ALEVE) 220 MG tablet Take 440 mg by mouth 2 (two) times daily as needed (for pain.).    Marland Kitchen polyethylene glycol  (MIRALAX / GLYCOLAX) packet Take 17 g by mouth every other day.     . prochlorperazine (COMPAZINE) 10 MG tablet Take 1 tablet (10 mg total) by mouth every 6 (six) hours as needed for nausea or vomiting. (Patient not taking: Reported on 11/19/2016) 30 tablet 2  . traMADol (ULTRAM) 50 MG tablet Take 1-2 tablets (50-100 mg total) by mouth every 6 (six) hours as needed. (Patient taking differently: Take 50-100 mg by mouth every 6 (six) hours as needed (for pain.). ) 60 tablet 0   Current Facility-Administered Medications  Medication Dose Route Frequency Provider Last Rate  Last Dose  . 0.9 %  sodium chloride infusion  500 mL Intravenous Continuous Nelida Meuse III, MD        REVIEW OF SYSTEMS:   Constitutional: Denies fevers, chills or abnormal night sweats Eyes: Denies blurriness of vision, double vision or watery eyes Ears, nose, mouth, throat, and face: Denies mucositis or sore throat Respiratory: Denies cough, dyspnea or wheezes Cardiovascular: Denies palpitation, chest discomfort or lower extremity swelling Gastrointestinal:  Denies nausea, heartburn or change in bowel habits Skin: Denies abnormal skin rashes Lymphatics: Denies new lymphadenopathy or easy bruising Neurological:Denies numbness, tingling or new weaknesses Behavioral/Psych: Mood is stable, no new changes  All other systems were reviewed with the patient and are negative.  PHYSICAL EXAMINATION: ECOG PERFORMANCE STATUS: 1 - Symptomatic but completely ambulatory  There were no vitals filed for this visit. There were no vitals filed for this visit.  GENERAL:alert, no distress and comfortable SKIN: skin color, texture, turgor are normal, no rashes or significant lesions EYES: normal, conjunctiva are pink and non-injected, sclera clear OROPHARYNX:no exudate, no erythema and lips, buccal mucosa, and tongue normal  NECK: supple, thyroid normal size, non-tender, without nodularity LYMPH:  no palpable lymphadenopathy in the  cervical, axillary or inguinal LUNGS: clear to auscultation and percussion with normal breathing effort HEART: regular rate & rhythm and no murmurs and no lower extremity edema ABDOMEN:abdomen soft, non-tender and normal bowel sounds. There is several skin ulcers in the pareanal area Musculoskeletal:no cyanosis of digits and no clubbing  PSYCH: alert & oriented x 3 with fluent speech NEURO: no focal motor/sensory deficits  LABORATORY DATA:  I have reviewed the data as listed CBC Latest Ref Rng & Units 09/13/2016 09/06/2016 08/30/2016  WBC 4.0 - 10.3 10e3/uL 6.2 6.1 6.3  Hemoglobin 13.0 - 17.1 g/dL 14.9 14.4 15.3  Hematocrit 38.4 - 49.9 % 45.0 42.8 44.9  Platelets 140 - 400 10e3/uL 170 161 191   CMP Latest Ref Rng & Units 09/13/2016 09/06/2016 08/30/2016  Glucose 70 - 140 mg/dl 122 69(L) 58(L)  BUN 7.0 - 26.0 mg/dL 10.5 14.0 15.4  Creatinine 0.7 - 1.3 mg/dL 0.8 0.9 0.8  Sodium 136 - 145 mEq/L 142 143 141  Potassium 3.5 - 5.1 mEq/L 4.1 3.9 4.0  Chloride 101 - 111 mmol/L - - -  CO2 22 - 29 mEq/L 26 28 24   Calcium 8.4 - 10.4 mg/dL 8.6 8.7 8.3(L)  Total Protein 6.4 - 8.3 g/dL 4.8(L) 4.4(L) 4.8(L)  Total Bilirubin 0.20 - 1.20 mg/dL <0.30 <0.30 <0.30  Alkaline Phos 40 - 150 U/L 56 53 50  AST 5 - 34 U/L 15 12 11   ALT 0 - 55 U/L 14 12 11    Pathology report  Diagnosis 07/28/2016 1. Surgical [P], descending, polyps(2) -TUBULAR ADENOMAS. -NO HIGH GRADE DYSPLASIA OR MALIGNANCY IDENTIFIED. 2. Surgical [P], rectal mass -INVASIVE ADENOCARCINOMA. -SEE COMMENT. 3. Surgical [P], rectum, polyps(2) -TUBULAR ADENOMAS. -NO HIGH GRADE DYSPLASIA OR MALIGNANCY IDENTIFIED. Microscopic Comment 2. Internal departmental review obtained (Dr. Saralyn Pilar) with agreement. Results are phoned to [facility], nurse in the office of Dr. Loletha Carrow. (MEG 08/03/16)  RADIOGRAPHIC STUDIES: I have personally reviewed the radiological images as listed and agreed with the findings in the report. No results found. EUS  07/29/2016 -Clearly malignant 6cm long, nearly circumferential mass in the distal rectum with distal edge located 1cm from the anal verge. If this is rectal adenocarcinoma it is uT3N2a, Stage IIIa.  COLONOSCOPY 07/28/2016 - Rectal mass. - Four 4 mm polyps in the rectum and in  the descending colon, removed with a cold snare. Resected and retrieved. - Diverticulosis in the left colon. - Likely malignant tumor in the distal rectum. Biopsied. Tattooed.   ASSESSMENT & PLAN: 45 year old male without significant past medical history presented with rectal pain and bleeding.  1. 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 He also has a slightly prominent periaortic lymph node, about 8 mm. -I reviewed his PET CT scan findings with patient and his wife, the small lung nodules and periaortic lymph node are not hypermetabolic, no definitive evidence of metastasis on the PET scan. -He has completed chemotherapy and irradiation, tolerated well overall. -His surgery is scheduled for December 13, I plan to see him back before surgery with a restaging CT scan, to give out to his normal nodules and abdominal adenopathy.  2. Rectal pain  -slightly worse lately with chemo and radiation  -He is taking oxycodone 5-10 mg at night, pain is not very well controlled -I given her a prescription of tramadol, 50-100 mg every 8 hours as needed  3. Anxiety  -He feels nervous and anxious since his cancer diagnosis -Continue Xanax as needed  4. Social issues -He is out of work, he wants to apply for Medicaid and disability -He was seen by our Education officer, museum  Plan -pt will see rad/onc next  month -Flu shot today -I'll see him back in 8 weeks with lab and a restaging CT chest, abdomen and pelvis with contrast a few days before  All questions were answered. The patient knows to call the clinic with any problems, questions or concerns. I spent 15 minutes counseling the patient face to face. The total time spent in the appointment was 20 minutes and more than 50% was on counseling.     Truitt Merle, MD 11/24/2016

## 2016-11-24 NOTE — Consult Note (Signed)
[facility] Nurse ostomy consult note [place] Nurse requested for preoperative stoma site marking by Dr. Johney Maine.  Patient is accompanied to this visit by wife.  DOS is Wednesday, December 01, 2016.  Discussed surgical procedure and stoma creation with patient and wife.  Explained role of the [place] nurse team.  Provided the patient with educational booklet and demonstrated a sample ostomy pouching option.  Answered patient and wife's questions.   Examined patient sitting, and standing in order to place the marking in the patient's visual field, away from any creases or abdominal contour issues and within the rectus muscle.  Numerous creased in the peri-umbilical region.  Patient wears his jeans and underwear very low on the pubis. .  Marked for ileostomy in the RUQ   6.5cm to the right of the umbilicus and  1.5 cm above the umbilicus.  Patient's abdomen cleansed with CHG wipes at site markings x2, allowed to air dry prior to marking.Marked with a surgical skin marking pen.  Covered mark with thin film transparent dressing to preserve mark until date of surgery.   [place] Nurse team will follow up with patient after surgery for continue ostomy care and teaching.   Thanks for allowing [place] to meet this nice gentleman prior to surgery and to mark the preferred stoma location Maudie Flakes, MSN, RN, [place], [facility]  Pager# [phone],

## 2016-11-24 NOTE — Telephone Encounter (Signed)
As of today, patient never came to pick up hydrocodone rx. Rx destroyed.

## 2016-11-25 LAB — HEMOGLOBIN A1C
Hgb A1c MFr Bld: 5.2 % (ref 4.8–5.6)
Mean Plasma Glucose: 103 mg/dL

## 2016-11-29 ENCOUNTER — Encounter (HOSPITAL_COMMUNITY): Payer: Self-pay

## 2016-11-29 ENCOUNTER — Ambulatory Visit (HOSPITAL_COMMUNITY)
Admission: RE | Admit: 2016-11-29 | Discharge: 2016-11-29 | Disposition: A | Discharge status: Home / Self Care | Payer: Medicaid Other | Source: Ambulatory Visit | Attending: Hematology | Admitting: Hematology

## 2016-11-29 DIAGNOSIS — R918 Other nonspecific abnormal finding of lung field: Secondary | ICD-10-CM | POA: Insufficient documentation

## 2016-11-29 DIAGNOSIS — C20 Malignant neoplasm of rectum: Secondary | ICD-10-CM | POA: Diagnosis not present

## 2016-11-29 MED ORDER — IOPAMIDOL (ISOVUE-300) INJECTION 61%
100.0000 mL | Freq: Once | INTRAVENOUS | Status: AC | PRN
  Administered 2016-11-29: 100 mL via INTRAVENOUS

## 2016-11-29 MED ORDER — SODIUM CHLORIDE 0.9 % IJ SOLN
INTRAMUSCULAR | Status: DC
Start: 2016-11-29 — End: 2016-11-30
  Filled 2016-11-29: qty 50

## 2016-11-29 MED ORDER — IOPAMIDOL (ISOVUE-300) INJECTION 61%
INTRAVENOUS | Status: AC
  Filled 2016-11-29: qty 100

## 2016-11-29 NOTE — Progress Notes (Signed)
[place]  Telephone:([phone]) [phone] Fax:([phone]) [phone]  Clinic follow Up Note   Patient Care Team: [name] as PCP - General (General Practice) Tania Ade, RN as Registered Nurse Michael Boston, MD as Consulting Physician (General Surgery) Doran Stabler, MD as Consulting Physician (Gastroenterology) Kyung Rudd, MD as Consulting Physician (Radiation Oncology) 11/30/2016  CHIEF COMPLAINTS:  Follow up rectal cancer  Oncology History   Rectal adenocarcinoma [facility])   Staging form: Colon and Rectum, AJCC 7th Edition   - Clinical stage from 07/28/2016: Stage IIIB (T3, N2a, M0) - Signed by Truitt Merle, MD on 08/06/2016      Rectal adenocarcinoma ([place])   07/26/2016 Imaging    CT chest, abdomen and pelvis with contrast showed nodular thickening of the rectal wall, enlarged perirectal lymph nodes. Diverticulosis. Nonspecific low-level lymphadenopathy. Lower lobe emphysema, small 3-4 mm left lower lobe lung nodules.      07/28/2016 Initial Diagnosis    Rectal adenocarcinoma ([place])      07/28/2016 Initial Biopsy    Rectal mass biopsy showed invasive adenocarcinoma, polyps in the ascending colon and rectum showed tubular adenoma       07/28/2016 Procedure    Colonoscopy showed a nearly circumferential mass in the distal rectum, 4 polyps in the descending colon, diverticulosis in the left colon.       07/29/2016 Procedure    Low EUS showed clearly malignant 6 cm long, nearly circumferential mass in the distal rectum, with distal edge located to 1 cm from the annual approach, multiple prior rectal lymph nodes (6) are suspicious ,uT3 N2       08/17/2016 -  Radiation Therapy    Neoadjuvant radiation to his rectal cancer      08/17/2016 -  Chemotherapy    Xeloda 1500 mg twice daily with concurrent irradiation      08/18/2016 Imaging    PET scan showed hypermetabolic rectal mass, perirectal node 1mm with mild increased uptake, no hypermetabolic distant metastasis,  small pulmonary nodules are too small to characterize by PET.        HISTORY OF PRESENTING ILLNESS:  [name] 45 y.o. male is here because of His newly diagnosed rectal cancer. He is accompanied by his girlfriend to our multidisciplinary GI clinic today.  He has had rectal pain for 6 weeks, and mild rectal bleeding with BM, he goes 4 times a day, very little each time, and pencil shaped stool, he has lost 30 lbs so far. His appettie remains to be good, he eats well, no cough, nausea, no abd pain. He has a moderate fatigue, he will 2 tolerate his routine activities including his job without much difficulty. He does not have a primary care physician, and has not seen doctors for many years.  He presents to emergency room on 07/26/2016 for the rectal pain and bleeding, and was referred to gastroenterologist Dr. Loletha Carrow. He underwent colonoscopy on 07/28/2016, which showed 4 polyps in the ascending colon, and a circumferential distal rectal mass. Biopsy of the rectal mass showed adenocarcinoma. He underwent EUS by Dr. Ardis Hughs on 07/29/2016 which showed a T3 N2 disease.   CURRENT THERAPY:  pending surgery   INTERIM HISTORY  [name] returns for follow-up. The patient states he feels better after completing treatment. Denies abdominal pain. Denies abnormal bowels or blood in his stool. The patient is eating better and denies alcohol use. The patient is scheduled for surgery tomorrow by Dr. Johney Maine.  MEDICAL HISTORY:  Past  Medical History:  Diagnosis Date  . Family history of colon cancer   . Poor dental hygiene        SURGICAL HISTORY: Past Surgical History:  Procedure Laterality Date  . EUS N/A 07/29/2016   Procedure: LOWER ENDOSCOPIC ULTRASOUND (EUS);  Surgeon: Milus Banister, MD;  Location: [facility];  Service: Endoscopy;  Laterality: N/A;  . TOE AMPUTATION Left     SOCIAL HISTORY: Social History   Social History  . Marital status: Married    Spouse name: N/A  . Number of  children: 3  . Years of education: N/A   Occupational History  . Not on file.   Social History Main Topics  . Smoking status: Current Every Day Smoker    Packs/day: 1.50    Years: 30.00    Types: Cigarettes  . Smokeless tobacco: Never Used  . Alcohol use No  . Drug use:     Types: Marijuana     Comment: patient denies  . Sexual activity: Not on file   Other Topics Concern  . Not on file   Social History Narrative   Lives with significant other, [name]   Have a 73 year old son   Smoker      [place] HISTORY: Family History  Problem Relation Age of Onset  . Diabetes Mother   . COPD Father   . Colon cancer Maternal Aunt     dx in her 68s  . Diabetes Maternal Grandmother   . Brain cancer Maternal Grandfather   . Lung cancer Paternal Grandfather   . Colon cancer Cousin     dx in her 55s  . Bone cancer Sister 8  . Pancreatic cancer Neg Hx   . Esophageal cancer Neg Hx   . Stomach cancer Neg Hx   . Liver disease Neg Hx    He lives with his girfriend the their 15 yo son, works for [employer]   ALLERGIES:  has No Known Allergies.  MEDICATIONS:  Current Outpatient Prescriptions  Medication Sig Dispense Refill  . ALPRAZolam (XANAX) 0.5 MG tablet Take 1-2 tablets (0.5-1 mg total) by mouth at bedtime as needed for anxiety. (Patient taking differently: Take 0.5-1 mg by mouth daily as needed for anxiety. For anxiety) 30 tablet 0  . docusate sodium (COLACE) 100 MG capsule Take 1 capsule (100 mg total) by mouth every 12 (twelve) hours. (Patient taking differently: Take 100 mg by mouth daily. ) 60 capsule 0  . naproxen sodium (ALEVE) 220 MG tablet Take 440 mg by mouth 2 (two) times daily as needed (for pain.).    Marland Kitchen polyethylene glycol (MIRALAX / GLYCOLAX) packet Take 17 g by mouth every other day.     . prochlorperazine (COMPAZINE) 10 MG tablet Take 1 tablet (10 mg total) by mouth every 6 (six) hours as needed for nausea or vomiting. 30 tablet 2    . traMADol (ULTRAM) 50 MG tablet Take 1-2 tablets (50-100 mg total) by mouth every 6 (six) hours as needed. (Patient taking differently: Take 50-100 mg by mouth every 6 (six) hours as needed (for pain.). ) 60 tablet 0   Current Facility-Administered Medications  Medication Dose Route Frequency Provider Last Rate Last Dose  . 0.9 %  sodium chloride infusion  500 mL Intravenous Continuous Nelida Meuse III, MD       Facility-Administered Medications Ordered in Other Visits  Medication Dose Route Frequency Provider Last Rate Last Dose  . [START  ON 12/01/2016] clindamycin (CLEOCIN) 900 mg, gentamicin (GARAMYCIN) 240 mg in sodium chloride 0.9 % 1,000 mL for intraperitoneal lavage   Intraperitoneal To OR Michael Boston, MD        REVIEW OF SYSTEMS: Constitutional: Denies fevers, chills or abnormal night sweats Eyes: Denies blurriness of vision, double vision or watery eyes Ears, nose, mouth, throat, and face: Denies mucositis or sore throat Respiratory: Denies cough, dyspnea or wheezes Cardiovascular: Denies palpitation, chest discomfort or lower extremity swelling Gastrointestinal:  Denies nausea, heartburn or change in bowel habits Skin: Denies abnormal skin rashes Lymphatics: Denies new lymphadenopathy or easy bruising Neurological:Denies numbness, tingling or new weaknesses Behavioral/Psych: Mood is stable, no new changes  All other systems were reviewed with the patient and are negative.  PHYSICAL EXAMINATION: ECOG PERFORMANCE STATUS: 0  Vitals:   11/30/16 1156  BP: 102/71  Pulse: 63  Resp: 18  Temp: 97.8 F (36.6 C)   Filed Weights   11/30/16 1156  Weight: 155 lb 4.8 oz (70.4 kg)    GENERAL:alert, no distress and comfortable SKIN: skin color, texture, turgor are normal, no rashes or significant lesions EYES: normal, conjunctiva are pink and non-injected, sclera clear OROPHARYNX:no exudate, no erythema and lips, buccal mucosa, and tongue normal  NECK: supple, thyroid normal  size, non-tender, without nodularity LYMPH:  no palpable lymphadenopathy in the cervical, axillary or inguinal LUNGS: clear to auscultation and percussion with normal breathing effort HEART: regular rate & rhythm and no murmurs and no lower extremity edema ABDOMEN:abdomen soft, non-tender and normal bowel sounds. Musculoskeletal:no cyanosis of digits and no clubbing  PSYCH: alert & oriented x 3 with fluent speech NEURO: no focal motor/sensory deficits  LABORATORY DATA:  I have reviewed the data as listed CBC Latest Ref Rng & Units 11/30/2016 11/24/2016 09/13/2016  WBC 4.0 - 10.3 10e3/uL 9.2 7.5 6.2  Hemoglobin 13.0 - 17.1 g/dL 15.7 15.0 14.9  Hematocrit 38.4 - 49.9 % 46.5 43.9 45.0  Platelets 140 - 400 10e3/uL 228 228 170   CMP Latest Ref Rng & Units 11/30/2016 11/24/2016 09/13/2016  Glucose 70 - 140 mg/dl 103 86 122  BUN 7.0 - 26.0 mg/dL 7.5 18 10.5  Creatinine 0.7 - 1.3 mg/dL 0.9 0.90 0.8  Sodium 136 - 145 mEq/L 142 139 142  Potassium 3.5 - 5.1 mEq/L 4.0 3.9 4.1  Chloride 101 - 111 mmol/L - 109 -  CO2 22 - 29 mEq/L 26 26 26   Calcium 8.4 - 10.4 mg/dL 8.1(L) 7.9(L) 8.6  Total Protein 6.4 - 8.3 g/dL 5.1(L) - 4.8(L)  Total Bilirubin 0.20 - 1.20 mg/dL 0.38 - <0.30  Alkaline Phos 40 - 150 U/L 61 - 56  AST 5 - 34 U/L 65(H) - 15  ALT 0 - 55 U/L 34 - 14   Pathology report  Diagnosis 07/28/2016 1. Surgical [P], descending, polyps(2) -TUBULAR ADENOMAS. -NO HIGH GRADE DYSPLASIA OR MALIGNANCY IDENTIFIED. 2. Surgical [P], rectal mass -INVASIVE ADENOCARCINOMA. -SEE COMMENT. 3. Surgical [P], rectum, polyps(2) -TUBULAR ADENOMAS. -NO HIGH GRADE DYSPLASIA OR MALIGNANCY IDENTIFIED. Microscopic Comment 2. Internal departmental review obtained (Dr. Saralyn Pilar) with agreement. Results are phoned to [facility], nurse in the office of Dr. Loletha Carrow. (MEG 08/03/16)  RADIOGRAPHIC STUDIES: I have personally reviewed the radiological images as listed and agreed with the findings in the report. Ct Chest W  Contrast  Result Date: 11/29/2016 CLINICAL DATA:  Rectal carcinoma diagnosed in August 2017. Restaging post colon resection and chemotherapy. Oral chemotherapy ongoing. EXAM: CT CHEST, ABDOMEN, AND PELVIS WITH CONTRAST TECHNIQUE: Multidetector CT  imaging of the chest, abdomen and pelvis was performed following the standard protocol during bolus administration of intravenous contrast. CONTRAST:  137mL ISOVUE-300 IOPAMIDOL (ISOVUE-300) INJECTION 61% COMPARISON:  Abdominopelvic CT 07/26/2016, chest CT 08/02/2016 and PET-CT 08/18/2016. FINDINGS: CT CHEST FINDINGS Cardiovascular: There are no significant vascular findings. The heart size is normal. There is no pericardial effusion. Mediastinum/Nodes: Stable small axillary, hilar and mediastinal lymph nodes, not pathologically enlarged. The thyroid gland, trachea and esophagus demonstrate no significant findings. Lungs/Pleura: There is no pleural effusion. Again demonstrated is age advanced centrilobular and paraseptal emphysema. There are scattered pulmonary nodules bilaterally which are stable. Those on the right are perifissural/subpleural in location, seen in the right upper lobe (image 65) and in the lower lobe (image 56). There are 3 left upper lobe pulmonary nodules which are stable, measuring 5 mm on image 34, 5 mm on image 36 and 6 mm on image 38. No new or enlarging pulmonary nodules are seen. Musculoskeletal/Chest wall: No chest wall mass or suspicious osseous findings. CT ABDOMEN AND PELVIS FINDINGS Hepatobiliary: The liver is normal in density without focal abnormality. No evidence of gallstones, gallbladder wall thickening or biliary dilatation. Pancreas: Unremarkable. No pancreatic ductal dilatation or surrounding inflammatory changes. Spleen: Normal in size without focal abnormality. Adrenals/Urinary Tract: Both adrenal glands appear normal. No suspicious renal findings. Probable tiny right renal cysts. No evidence of urinary tract calculus or  hydronephrosis. The bladder appears unremarkable. Stomach/Bowel: The stomach, small bowel, appendix and proximal colon appear normal. Previously demonstrated rectosigmoid wall thickening appears improved. There is sigmoid diverticulosis without acute surrounding inflammation. No evidence of bowel obstruction. Vascular/Lymphatic: Scattered small lymph nodes within the small bowel mesentery are improved. There is no retroperitoneal or pelvic lymphadenopathy. No significant vascular findings are present. Reproductive: The prostate gland and seminal vesicles appear normal. Other: Mild perirectal soft tissue stranding, likely treatment related. No focal fluid collection. No generalized ascites or anterior abdominal wall abnormality. Musculoskeletal: No acute or significant osseous findings. 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 IMPRESSION: 1. Interval treatment changes within the perirectal fat with otherwise stable rectosigmoid colon wall thickening. 2. No progressive adenopathy or distant metastases identified. 3. Stable bilateral pulmonary nodules. Short-term stability (for 4 months) is reassuring, although the nodules are too small to optimally characterize by previous PET-CT. Continued CT follow-up recommended. Electronically Signed   By: Richardean Sale M.D.   On: 11/29/2016 17:16   Ct Abdomen Pelvis W Contrast  Result Date: 11/29/2016 CLINICAL DATA:  Rectal carcinoma diagnosed in August 2017. Restaging post colon resection and chemotherapy. Oral chemotherapy ongoing. EXAM: CT CHEST, ABDOMEN, AND PELVIS WITH CONTRAST TECHNIQUE: Multidetector CT imaging of the chest, abdomen and pelvis was performed following the standard protocol during bolus administration of intravenous contrast. CONTRAST:  11mL ISOVUE-300 IOPAMIDOL (ISOVUE-300) INJECTION 61% COMPARISON:  Abdominopelvic CT 07/26/2016, chest CT 08/02/2016 and PET-CT 08/18/2016.  FINDINGS: CT CHEST FINDINGS Cardiovascular: There are no significant vascular findings. The heart size is normal. There is no pericardial effusion. Mediastinum/Nodes: Stable small axillary, hilar and mediastinal lymph nodes, not pathologically enlarged. The thyroid gland, trachea and esophagus demonstrate no significant findings. Lungs/Pleura: There is no pleural effusion. Again demonstrated is age advanced centrilobular and paraseptal emphysema. There are scattered pulmonary nodules bilaterally which are stable. Those on the right are perifissural/subpleural in location, seen in the right upper lobe (image 65) and in the lower lobe (image 56). There are 3 left upper lobe pulmonary nodules which  are stable, measuring 5 mm on image 34, 5 mm on image 36 and 6 mm on image 38. No new or enlarging pulmonary nodules are seen. Musculoskeletal/Chest wall: No chest wall mass or suspicious osseous findings. CT ABDOMEN AND PELVIS FINDINGS Hepatobiliary: The liver is normal in density without focal abnormality. No evidence of gallstones, gallbladder wall thickening or biliary dilatation. Pancreas: Unremarkable. No pancreatic ductal dilatation or surrounding inflammatory changes. Spleen: Normal in size without focal abnormality. Adrenals/Urinary Tract: Both adrenal glands appear normal. No suspicious renal findings. Probable tiny right renal cysts. No evidence of urinary tract calculus or hydronephrosis. The bladder appears unremarkable. Stomach/Bowel: The stomach, small bowel, appendix and proximal colon appear normal. Previously demonstrated rectosigmoid wall thickening appears improved. There is sigmoid diverticulosis without acute surrounding inflammation. No evidence of bowel obstruction. Vascular/Lymphatic: Scattered small lymph nodes within the small bowel mesentery are improved. There is no retroperitoneal or pelvic lymphadenopathy. No significant vascular findings are present. Reproductive: The prostate gland and  seminal vesicles appear normal. Other: Mild perirectal soft tissue stranding, likely treatment related. No focal fluid collection. No generalized ascites or anterior abdominal wall abnormality. Musculoskeletal: No acute or significant osseous findings. 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 IMPRESSION: 1. Interval treatment changes within the perirectal fat with otherwise stable rectosigmoid colon wall thickening. 2. No progressive adenopathy or distant metastases identified. 3. Stable bilateral pulmonary nodules. Short-term stability (for 4 months) is reassuring, although the nodules are too small to optimally characterize by previous PET-CT. Continued CT follow-up recommended. Electronically Signed   By: Richardean Sale M.D.   On: 11/29/2016 17:16   EUS 07/29/2016 -Clearly malignant 6cm long, nearly circumferential mass in the distal rectum with distal edge located 1cm from the anal verge. If this is rectal adenocarcinoma it is uT3N2a, Stage IIIa.  COLONOSCOPY 07/28/2016 - Rectal mass. - Four 4 mm polyps in the rectum and in the descending colon, removed with a cold snare. Resected and retrieved. - Diverticulosis in the left colon. - Likely malignant tumor in the distal rectum. Biopsied. Tattooed.  CT of the chest/abd/pelvis with contrast 11/29/2016 IMPRESSION: 1. Interval treatment changes within the perirectal fat with otherwise stable rectosigmoid colon wall thickening. 2. No progressive adenopathy or distant metastases identified. 3. Stable bilateral pulmonary nodules. Short-term stability (for 4 months) is reassuring, although the nodules are too small to optimally characterize by previous PET-CT. Continued CT follow-up recommended.   ASSESSMENT & PLAN: 45 y.o. male without significant past medical history presented with rectal pain and bleeding.  1. 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 He also has a slightly prominent periaortic lymph node, about 8 mm. -I previously reviewed his PET CT scan findings with patient and his girl firend, the small lung nodules and periaortic lymph node are not hypermetabolic, no definitive evidence of metastasis on the PET scan. -He completed neoadjuvant chemoradiation, tolerated moderate well overall. Lab results reviewed with patient, no significant abnormalities on lab. -I reviewed his restaging CT scan findings with the patient, he showed a stable small lung nodules, no significant abdominal adenopathy, no evidence of distant recurrence. -The patient will proceed with  surgery tomorrow by Dr. Johney Maine. We briefly discussed the role of adjuvant chemotherapy after surgery. Depending on his residual cancer burden, we'll decide if he will have single agent Xeloda versus combined chemotherapy such as CAPOX after surgery -We also discussed rectal cancer surveillance after surgery and adjuvant treatment.  2. Rectal pain  -Resolved  3. Anxiety  -He feels nervous and anxious since his cancer diagnosis, much improved after starting treatment  -Continue Xanax as needed  4. Social issues -He is out of work, he wants to apply for Medicaid and disability  -He was seen by our Education officer, museum  Plan -Patient is doing well,  lab and scan results reviewed. -He will undergo surgery tomorrow by Dr. Johney Maine. -He will follow up in 3-4 weeks, to finalize his adjuvant chemo   All questions were answered. The patient knows to call the clinic with any problems, questions or concerns. I spent 20 minutes counseling the patient face to  face. The total time spent in the appointment was 25 minutes and more than 50% was on counseling.     Truitt Merle, MD 11/30/2016 3:12 PM   This document serves as a record of services personally performed by Truitt Merle, MD. It was created on her behalf by Darcus Austin, a trained medical scribe. The creation of this record is based on the scribe's personal observations and the provider's statements to them. This document has been checked and approved by the attending provider.

## 2016-11-30 ENCOUNTER — Ambulatory Visit (HOSPITAL_BASED_OUTPATIENT_CLINIC_OR_DEPARTMENT_OTHER): Payer: Medicaid Other | Admitting: Hematology

## 2016-11-30 ENCOUNTER — Encounter: Payer: Self-pay | Admitting: Hematology

## 2016-11-30 ENCOUNTER — Other Ambulatory Visit (HOSPITAL_BASED_OUTPATIENT_CLINIC_OR_DEPARTMENT_OTHER): Payer: Medicaid Other

## 2016-11-30 ENCOUNTER — Telehealth: Payer: Self-pay | Admitting: Hematology

## 2016-11-30 VITALS — BP 102/71 | HR 63 | Temp 97.8°F | Resp 18 | Ht 71.0 in | Wt 155.3 lb

## 2016-11-30 DIAGNOSIS — R918 Other nonspecific abnormal finding of lung field: Secondary | ICD-10-CM

## 2016-11-30 DIAGNOSIS — C20 Malignant neoplasm of rectum: Secondary | ICD-10-CM

## 2016-11-30 DIAGNOSIS — F411 Generalized anxiety disorder: Secondary | ICD-10-CM

## 2016-11-30 LAB — COMPREHENSIVE METABOLIC PANEL
ALT: 34 U/L (ref 0–55)
AST: 65 U/L — AB (ref 5–34)
Albumin: 2.6 g/dL — ABNORMAL LOW (ref 3.5–5.0)
Alkaline Phosphatase: 61 U/L (ref 40–150)
Anion Gap: 7 mEq/L (ref 3–11)
BILIRUBIN TOTAL: 0.38 mg/dL (ref 0.20–1.20)
BUN: 7.5 mg/dL (ref 7.0–26.0)
CHLORIDE: 108 meq/L (ref 98–109)
CO2: 26 meq/L (ref 22–29)
CREATININE: 0.9 mg/dL (ref 0.7–1.3)
Calcium: 8.1 mg/dL — ABNORMAL LOW (ref 8.4–10.4)
EGFR: >90 mL/min/{1.73_m2} (ref >90)
GLUCOSE: 103 mg/dL (ref 70–140)
Potassium: 4 mEq/L (ref 3.5–5.1)
SODIUM: 142 meq/L (ref 136–145)
TOTAL PROTEIN: 5.1 g/dL — AB (ref 6.4–8.3)

## 2016-11-30 LAB — CBC WITH DIFFERENTIAL/PLATELET
BASO%: 0.8 % (ref 0.0–2.0)
Basophils Absolute: 0.1 10*3/uL (ref 0.0–0.1)
EOS%: 3 % (ref 0.0–7.0)
Eosinophils Absolute: 0.3 10*3/uL (ref 0.0–0.5)
HCT: 46.5 % (ref 38.4–49.9)
HGB: 15.7 g/dL (ref 13.0–17.1)
LYMPH%: 17.6 % (ref 14.0–49.0)
MCH: 30.7 pg (ref 27.2–33.4)
MCHC: 33.8 g/dL (ref 32.0–36.0)
MCV: 91 fL (ref 79.3–98.0)
MONO#: 0.7 10*3/uL (ref 0.1–0.9)
MONO%: 7.2 % (ref 0.0–14.0)
NEUT%: 71.4 % (ref 39.0–75.0)
NEUTROS ABS: 6.6 10*3/uL — AB (ref 1.5–6.5)
Platelets: 228 10*3/uL (ref 140–400)
RBC: 5.11 10*6/uL (ref 4.20–5.82)
RDW: 14.9 % — ABNORMAL HIGH (ref 11.0–14.6)
WBC: 9.2 10*3/uL (ref 4.0–10.3)
lymph#: 1.6 10*3/uL (ref 0.9–3.3)

## 2016-11-30 LAB — TECHNOLOGIST REVIEW

## 2016-11-30 LAB — CEA (IN HOUSE-CHCC): CEA (CHCC-In House): 2.12 ng/mL (ref 0.00–5.00)

## 2016-11-30 MED ORDER — SODIUM CHLORIDE 0.9 % IV SOLN
INTRAVENOUS | Status: DC
  Filled 2016-11-30: qty 6

## 2016-11-30 NOTE — Anesthesia Preprocedure Evaluation (Addendum)
Anesthesia Evaluation  Patient identified by MRN, date of birth, ID band Patient awake    Reviewed: Allergy & Precautions, NPO status , Patient's Chart, lab work & pertinent test results  Airway Mallampati: II  TM Distance: >3 FB Neck ROM: Full    Dental  (+) Dental Advisory Given   Pulmonary Current Smoker,    breath sounds clear to auscultation       Cardiovascular negative cardio ROS   Rhythm:Regular Rate:Normal     Neuro/Psych    GI/Hepatic Neg liver ROS, Rectal cancer.   Endo/Other  negative endocrine ROS  Renal/GU negative Renal ROS     Musculoskeletal   Abdominal   Peds  Hematology negative hematology ROS (+)   Anesthesia Other Findings   Reproductive/Obstetrics                            Anesthesia Physical Anesthesia Plan  ASA: II  Anesthesia Plan: General   Post-op Pain Management:    Induction: Intravenous  Airway Management Planned: Oral ETT  Additional Equipment:   Intra-op Plan:   Post-operative Plan: Extubation in OR  Informed Consent: I have reviewed the patients History and Physical, chart, labs and discussed the procedure including the risks, benefits and alternatives for the proposed anesthesia with the patient or authorized representative who has indicated his/her understanding and acceptance.   Dental advisory given  Plan Discussed with: CRNA  Anesthesia Plan Comments: (ERAS protocol)       Anesthesia Quick Evaluation

## 2016-11-30 NOTE — Telephone Encounter (Signed)
Patient did not come to check out area on 11/30/16. Appointments scheduled per 11/30/16 los.  Lab and follow up appointments was confirmed with patient's wife, per 11/30/16 los.

## 2016-12-01 ENCOUNTER — Encounter (HOSPITAL_COMMUNITY): Payer: Self-pay | Admitting: *Deleted

## 2016-12-01 ENCOUNTER — Inpatient Hospital Stay (HOSPITAL_COMMUNITY): Payer: Medicaid Other | Admitting: Anesthesiology

## 2016-12-01 ENCOUNTER — Inpatient Hospital Stay (HOSPITAL_COMMUNITY)
Admission: RE | Admit: 2016-12-01 | Discharge: 2016-12-05 | DRG: 331 | Disposition: A | Discharge status: Home Health | Payer: Medicaid Other | Source: Ambulatory Visit | Attending: Surgery | Admitting: Surgery

## 2016-12-01 ENCOUNTER — Encounter (HOSPITAL_COMMUNITY)
Admission: RE | Disposition: A | Discharge status: Home Health | Payer: Self-pay | Source: Ambulatory Visit | Attending: Surgery

## 2016-12-01 DIAGNOSIS — C189 Malignant neoplasm of colon, unspecified: Secondary | ICD-10-CM

## 2016-12-01 DIAGNOSIS — Z841 Family history of disorders of kidney and ureter: Secondary | ICD-10-CM | POA: Diagnosis not present

## 2016-12-01 DIAGNOSIS — Z801 Family history of malignant neoplasm of trachea, bronchus and lung: Secondary | ICD-10-CM

## 2016-12-01 DIAGNOSIS — K645 Perianal venous thrombosis: Secondary | ICD-10-CM | POA: Diagnosis present

## 2016-12-01 DIAGNOSIS — Z800 Family history of malignant neoplasm of digestive organs: Secondary | ICD-10-CM

## 2016-12-01 DIAGNOSIS — Z923 Personal history of irradiation: Secondary | ICD-10-CM

## 2016-12-01 DIAGNOSIS — Z808 Family history of malignant neoplasm of other organs or systems: Secondary | ICD-10-CM | POA: Diagnosis not present

## 2016-12-01 DIAGNOSIS — K91858 Other complications of intestinal pouch: Secondary | ICD-10-CM

## 2016-12-01 DIAGNOSIS — Z932 Ileostomy status: Secondary | ICD-10-CM

## 2016-12-01 DIAGNOSIS — Z833 Family history of diabetes mellitus: Secondary | ICD-10-CM

## 2016-12-01 DIAGNOSIS — C20 Malignant neoplasm of rectum: Secondary | ICD-10-CM | POA: Diagnosis present

## 2016-12-01 DIAGNOSIS — K219 Gastro-esophageal reflux disease without esophagitis: Secondary | ICD-10-CM | POA: Diagnosis present

## 2016-12-01 DIAGNOSIS — C19 Malignant neoplasm of rectosigmoid junction: Secondary | ICD-10-CM | POA: Diagnosis present

## 2016-12-01 DIAGNOSIS — Z9221 Personal history of antineoplastic chemotherapy: Secondary | ICD-10-CM

## 2016-12-01 DIAGNOSIS — Z825 Family history of asthma and other chronic lower respiratory diseases: Secondary | ICD-10-CM

## 2016-12-01 DIAGNOSIS — Z15060 Genetic susceptibility to colorectal cancer: Secondary | ICD-10-CM | POA: Diagnosis present

## 2016-12-01 DIAGNOSIS — K6289 Other specified diseases of anus and rectum: Secondary | ICD-10-CM

## 2016-12-01 DIAGNOSIS — Z8249 Family history of ischemic heart disease and other diseases of the circulatory system: Secondary | ICD-10-CM | POA: Diagnosis not present

## 2016-12-01 DIAGNOSIS — Z1509 Genetic susceptibility to other malignant neoplasm: Secondary | ICD-10-CM | POA: Diagnosis present

## 2016-12-01 DIAGNOSIS — F17210 Nicotine dependence, cigarettes, uncomplicated: Secondary | ICD-10-CM | POA: Diagnosis present

## 2016-12-01 DIAGNOSIS — F411 Generalized anxiety disorder: Secondary | ICD-10-CM | POA: Diagnosis present

## 2016-12-01 DIAGNOSIS — F419 Anxiety disorder, unspecified: Secondary | ICD-10-CM | POA: Diagnosis present

## 2016-12-01 HISTORY — DX: Ileostomy status: Z93.2

## 2016-12-01 HISTORY — PX: PROCTOSCOPY: SHX2266

## 2016-12-01 HISTORY — PX: XI ROBOTIC ASSISTED LOWER ANTERIOR RESECTION: SHX6558

## 2016-12-01 HISTORY — DX: Malignant neoplasm of colon, unspecified: C18.9

## 2016-12-01 LAB — TYPE AND SCREEN
ABO/RH(D): A POS
Antibody Screen: NEGATIVE

## 2016-12-01 SURGERY — RESECTION, RECTUM, LOW ANTERIOR, ROBOT-ASSISTED
Anesthesia: General | Site: Rectum

## 2016-12-01 MED ORDER — POLYETHYLENE GLYCOL 3350 17 G PO PACK
17.0000 g | PACK | Freq: Every day | ORAL | Status: DC
  Administered 2016-12-01 – 2016-12-02 (×2): 17 g via ORAL
  Filled 2016-12-01 (×2): qty 1

## 2016-12-01 MED ORDER — BUPIVACAINE HCL (PF) 0.5 % IJ SOLN
INTRAMUSCULAR | Status: AC
  Filled 2016-12-01: qty 30

## 2016-12-01 MED ORDER — SCOPOLAMINE 1 MG/3DAYS TD PT72
MEDICATED_PATCH | TRANSDERMAL | Status: DC | PRN
  Administered 2016-12-01: 1 via TRANSDERMAL

## 2016-12-01 MED ORDER — PHENOL 1.4 % MT LIQD: 2.0000 | OROMUCOSAL | Status: DC | PRN

## 2016-12-01 MED ORDER — KETAMINE HCL 10 MG/ML IJ SOLN
INTRAMUSCULAR | Status: AC
  Filled 2016-12-01: qty 1

## 2016-12-01 MED ORDER — PROPOFOL 10 MG/ML IV BOLUS
INTRAVENOUS | Status: AC
  Filled 2016-12-01: qty 40

## 2016-12-01 MED ORDER — ACETAMINOPHEN 500 MG PO TABS
1000.0000 mg | ORAL_TABLET | Freq: Three times a day (TID) | ORAL | Status: DC
  Administered 2016-12-01 – 2016-12-05 (×12): 1000 mg via ORAL
  Filled 2016-12-01 (×13): qty 2

## 2016-12-01 MED ORDER — METOCLOPRAMIDE HCL 5 MG/ML IJ SOLN: 5.0000 mg | Freq: Four times a day (QID) | INTRAMUSCULAR | Status: DC | PRN

## 2016-12-01 MED ORDER — PROCHLORPERAZINE EDISYLATE 5 MG/ML IJ SOLN: 10.0000 mg | Freq: Four times a day (QID) | INTRAMUSCULAR | Status: DC | PRN

## 2016-12-01 MED ORDER — LIDOCAINE 2% (20 MG/ML) 5 ML SYRINGE
INTRAMUSCULAR | Status: AC
  Filled 2016-12-01: qty 5

## 2016-12-01 MED ORDER — LACTATED RINGERS IV SOLN
INTRAVENOUS | Status: DC | PRN
  Administered 2016-12-01 (×3): via INTRAVENOUS

## 2016-12-01 MED ORDER — ONDANSETRON HCL 4 MG/2ML IJ SOLN
INTRAMUSCULAR | Status: AC
  Filled 2016-12-01: qty 2

## 2016-12-01 MED ORDER — SUGAMMADEX SODIUM 200 MG/2ML IV SOLN
INTRAVENOUS | Status: DC | PRN
  Administered 2016-12-01: 200 mg via INTRAVENOUS

## 2016-12-01 MED ORDER — CHLORHEXIDINE GLUCONATE 4 % EX LIQD: 60.0000 mL | Freq: Once | CUTANEOUS | Status: DC

## 2016-12-01 MED ORDER — DEXTROSE 5 % IV SOLN
2.0000 g | INTRAVENOUS | Status: AC
  Administered 2016-12-01 (×2): 2 g via INTRAVENOUS
  Filled 2016-12-01: qty 2

## 2016-12-01 MED ORDER — LABETALOL HCL 5 MG/ML IV SOLN
INTRAVENOUS | Status: DC | PRN
  Administered 2016-12-01 (×2): 2.5 mg via INTRAVENOUS

## 2016-12-01 MED ORDER — LACTATED RINGERS IV BOLUS (SEPSIS): 1000.0000 mL | Freq: Three times a day (TID) | INTRAVENOUS | Status: DC | PRN

## 2016-12-01 MED ORDER — LACTATED RINGERS IR SOLN
Status: DC | PRN
  Administered 2016-12-01: 3000 mL

## 2016-12-01 MED ORDER — FENTANYL CITRATE (PF) 250 MCG/5ML IJ SOLN
INTRAMUSCULAR | Status: AC
  Filled 2016-12-01: qty 5

## 2016-12-01 MED ORDER — HYDROMORPHONE HCL 2 MG/ML IJ SOLN
0.5000 mg | INTRAMUSCULAR | Status: DC | PRN
  Administered 2016-12-01: 1 mg via INTRAVENOUS
  Administered 2016-12-02 (×2): 2 mg via INTRAVENOUS
  Administered 2016-12-02: 1 mg via INTRAVENOUS
  Administered 2016-12-02 – 2016-12-05 (×15): 2 mg via INTRAVENOUS
  Filled 2016-12-01 (×20): qty 1

## 2016-12-01 MED ORDER — BUPIVACAINE HCL (PF) 0.5 % IJ SOLN
INTRAMUSCULAR | Status: DC | PRN
  Administered 2016-12-01: 30 mL

## 2016-12-01 MED ORDER — LIP MEDEX EX OINT
1.0000 "application " | TOPICAL_OINTMENT | Freq: Two times a day (BID) | CUTANEOUS | Status: DC
  Administered 2016-12-01 – 2016-12-05 (×6): 1 via TOPICAL
  Filled 2016-12-01 (×4): qty 7

## 2016-12-01 MED ORDER — ALVIMOPAN 12 MG PO CAPS
12.0000 mg | ORAL_CAPSULE | Freq: Two times a day (BID) | ORAL | Status: DC
  Administered 2016-12-02: 12 mg via ORAL
  Filled 2016-12-01: qty 1

## 2016-12-01 MED ORDER — LOPERAMIDE HCL 2 MG PO CAPS: 2.0000 mg | ORAL_CAPSULE | Freq: Three times a day (TID) | ORAL | Status: DC | PRN

## 2016-12-01 MED ORDER — SODIUM CHLORIDE 0.9 % IV SOLN
500.0000 mL | INTRAVENOUS | Status: DC
  Administered 2016-12-01: 500 mL via INTRAVENOUS

## 2016-12-01 MED ORDER — LIDOCAINE 2% (20 MG/ML) 5 ML SYRINGE
INTRAMUSCULAR | Status: AC
  Filled 2016-12-01: qty 10

## 2016-12-01 MED ORDER — ENOXAPARIN SODIUM 40 MG/0.4ML ~~LOC~~ SOLN
40.0000 mg | SUBCUTANEOUS | Status: DC
  Administered 2016-12-02 – 2016-12-03 (×2): 40 mg via SUBCUTANEOUS
  Filled 2016-12-01 (×2): qty 0.4

## 2016-12-01 MED ORDER — KETAMINE HCL 10 MG/ML IJ SOLN
INTRAMUSCULAR | Status: DC | PRN
  Administered 2016-12-01: 10 mg via INTRAVENOUS
  Administered 2016-12-01: 40 mg via INTRAVENOUS
  Administered 2016-12-01 (×5): 10 mg via INTRAVENOUS

## 2016-12-01 MED ORDER — SACCHAROMYCES BOULARDII 250 MG PO CAPS
250.0000 mg | ORAL_CAPSULE | Freq: Two times a day (BID) | ORAL | Status: DC
  Administered 2016-12-01 – 2016-12-02 (×3): 250 mg via ORAL
  Filled 2016-12-01 (×3): qty 1

## 2016-12-01 MED ORDER — PROMETHAZINE HCL 25 MG/ML IJ SOLN: 6.2500 mg | INTRAMUSCULAR | Status: DC | PRN

## 2016-12-01 MED ORDER — ALUM & MAG HYDROXIDE-SIMETH 200-200-20 MG/5ML PO SUSP
30.0000 mL | Freq: Four times a day (QID) | ORAL | Status: DC | PRN
  Administered 2016-12-02 – 2016-12-04 (×2): 30 mL via ORAL
  Filled 2016-12-01 (×2): qty 30

## 2016-12-01 MED ORDER — ALBUMIN HUMAN 5 % IV SOLN
INTRAVENOUS | Status: DC | PRN
  Administered 2016-12-01: 09:00:00 via INTRAVENOUS

## 2016-12-01 MED ORDER — PROPOFOL 10 MG/ML IV BOLUS
INTRAVENOUS | Status: DC | PRN
Start: 2016-12-01 — End: 2016-12-01
  Administered 2016-12-01: 160 mg via INTRAVENOUS

## 2016-12-01 MED ORDER — BISMUTH SUBSALICYLATE 262 MG/15ML PO SUSP: 30.0000 mL | Freq: Three times a day (TID) | ORAL | Status: DC | PRN

## 2016-12-01 MED ORDER — CEFOTETAN DISODIUM-DEXTROSE 2-2.08 GM-% IV SOLR
INTRAVENOUS | Status: AC
  Filled 2016-12-01: qty 50

## 2016-12-01 MED ORDER — TRAMADOL HCL 50 MG PO TABS: 50.0000 mg | ORAL_TABLET | Freq: Four times a day (QID) | ORAL | 0 refills | Status: DC | PRN

## 2016-12-01 MED ORDER — MIDAZOLAM HCL 2 MG/2ML IJ SOLN
INTRAMUSCULAR | Status: AC
  Filled 2016-12-01: qty 2

## 2016-12-01 MED ORDER — SODIUM CHLORIDE 0.9 % IJ SOLN
INTRAMUSCULAR | Status: DC | PRN
  Administered 2016-12-01: 20 mL

## 2016-12-01 MED ORDER — BUPIVACAINE LIPOSOME 1.3 % IJ SUSP
INTRAMUSCULAR | Status: DC | PRN
  Administered 2016-12-01: 20 mL

## 2016-12-01 MED ORDER — GABAPENTIN 300 MG PO CAPS
300.0000 mg | ORAL_CAPSULE | ORAL | Status: AC
  Administered 2016-12-01: 300 mg via ORAL
  Filled 2016-12-01: qty 1

## 2016-12-01 MED ORDER — GLYCOPYRROLATE 0.2 MG/ML IV SOSY
PREFILLED_SYRINGE | INTRAVENOUS | Status: DC | PRN
  Administered 2016-12-01: .2 mg via INTRAVENOUS

## 2016-12-01 MED ORDER — LABETALOL HCL 5 MG/ML IV SOLN
INTRAVENOUS | Status: AC
  Filled 2016-12-01: qty 4

## 2016-12-01 MED ORDER — ROCURONIUM BROMIDE 10 MG/ML (PF) SYRINGE
PREFILLED_SYRINGE | INTRAVENOUS | Status: DC | PRN
  Administered 2016-12-01: 10 mg via INTRAVENOUS
  Administered 2016-12-01: 20 mg via INTRAVENOUS
  Administered 2016-12-01 (×3): 10 mg via INTRAVENOUS
  Administered 2016-12-01: 40 mg via INTRAVENOUS
  Administered 2016-12-01: 10 mg via INTRAVENOUS

## 2016-12-01 MED ORDER — MIDAZOLAM HCL 5 MG/5ML IJ SOLN
INTRAMUSCULAR | Status: DC | PRN
  Administered 2016-12-01: 2 mg via INTRAVENOUS

## 2016-12-01 MED ORDER — ESMOLOL HCL 100 MG/10ML IV SOLN
INTRAVENOUS | Status: DC | PRN
  Administered 2016-12-01 (×5): 10 mg via INTRAVENOUS

## 2016-12-01 MED ORDER — ALVIMOPAN 12 MG PO CAPS
12.0000 mg | ORAL_CAPSULE | Freq: Once | ORAL | Status: AC
  Administered 2016-12-01: 12 mg via ORAL
  Filled 2016-12-01: qty 1

## 2016-12-01 MED ORDER — GENTAMICIN SULFATE 40 MG/ML IJ SOLN
INTRAMUSCULAR | Status: DC | PRN
  Administered 2016-12-01: 1000 mL via INTRAPERITONEAL

## 2016-12-01 MED ORDER — SODIUM CHLORIDE 0.9 % IJ SOLN
INTRAMUSCULAR | Status: AC
  Filled 2016-12-01: qty 20

## 2016-12-01 MED ORDER — LIDOCAINE 2% (20 MG/ML) 5 ML SYRINGE
INTRAMUSCULAR | Status: DC | PRN
  Administered 2016-12-01: 80 mg via INTRAVENOUS
  Administered 2016-12-01 (×4): 100 mg via INTRAVENOUS

## 2016-12-01 MED ORDER — ALPRAZOLAM 0.5 MG PO TABS: 0.5000 mg | ORAL_TABLET | Freq: Three times a day (TID) | ORAL | Status: DC | PRN

## 2016-12-01 MED ORDER — BISACODYL 5 MG PO TBEC: 20.0000 mg | DELAYED_RELEASE_TABLET | Freq: Once | ORAL | Status: DC

## 2016-12-01 MED ORDER — SUGAMMADEX SODIUM 200 MG/2ML IV SOLN
INTRAVENOUS | Status: AC
  Filled 2016-12-01: qty 2

## 2016-12-01 MED ORDER — METOPROLOL TARTRATE 12.5 MG HALF TABLET: 12.5000 mg | ORAL_TABLET | Freq: Two times a day (BID) | ORAL | Status: DC | PRN

## 2016-12-01 MED ORDER — ENOXAPARIN SODIUM 40 MG/0.4ML ~~LOC~~ SOLN
40.0000 mg | Freq: Once | SUBCUTANEOUS | Status: AC
  Administered 2016-12-01: 40 mg via SUBCUTANEOUS
  Filled 2016-12-01: qty 0.4

## 2016-12-01 MED ORDER — DEXAMETHASONE SODIUM PHOSPHATE 10 MG/ML IJ SOLN
INTRAMUSCULAR | Status: DC | PRN
  Administered 2016-12-01: 10 mg via INTRAVENOUS

## 2016-12-01 MED ORDER — METHOCARBAMOL 1000 MG/10ML IJ SOLN
1000.0000 mg | Freq: Four times a day (QID) | INTRAVENOUS | Status: DC | PRN
  Filled 2016-12-01: qty 10

## 2016-12-01 MED ORDER — MAGIC MOUTHWASH
15.0000 mL | Freq: Four times a day (QID) | ORAL | Status: DC | PRN
  Filled 2016-12-01: qty 15

## 2016-12-01 MED ORDER — PROCHLORPERAZINE MALEATE 10 MG PO TABS
10.0000 mg | ORAL_TABLET | Freq: Four times a day (QID) | ORAL | Status: DC | PRN
  Filled 2016-12-01: qty 1

## 2016-12-01 MED ORDER — DIPHENHYDRAMINE HCL 12.5 MG/5ML PO ELIX: 12.5000 mg | ORAL_SOLUTION | Freq: Four times a day (QID) | ORAL | Status: DC | PRN

## 2016-12-01 MED ORDER — ESMOLOL HCL 100 MG/10ML IV SOLN
INTRAVENOUS | Status: AC
  Filled 2016-12-01: qty 10

## 2016-12-01 MED ORDER — FENTANYL CITRATE (PF) 100 MCG/2ML IJ SOLN
25.0000 ug | INTRAMUSCULAR | Status: DC | PRN
Start: 2016-12-01 — End: 2016-12-01

## 2016-12-01 MED ORDER — SCOPOLAMINE 1 MG/3DAYS TD PT72
MEDICATED_PATCH | TRANSDERMAL | Status: AC
  Filled 2016-12-01: qty 1

## 2016-12-01 MED ORDER — CELECOXIB 200 MG PO CAPS
400.0000 mg | ORAL_CAPSULE | ORAL | Status: AC
  Administered 2016-12-01: 400 mg via ORAL
  Filled 2016-12-01: qty 2

## 2016-12-01 MED ORDER — DEXTROSE 5 % IV SOLN
2.0000 g | Freq: Two times a day (BID) | INTRAVENOUS | Status: AC
  Administered 2016-12-02: 2 g via INTRAVENOUS
  Filled 2016-12-01: qty 2

## 2016-12-01 MED ORDER — ONDANSETRON HCL 4 MG/2ML IJ SOLN
INTRAMUSCULAR | Status: DC | PRN
  Administered 2016-12-01 (×2): 4 mg via INTRAVENOUS

## 2016-12-01 MED ORDER — ROCURONIUM BROMIDE 50 MG/5ML IV SOSY
PREFILLED_SYRINGE | INTRAVENOUS | Status: AC
  Filled 2016-12-01: qty 10

## 2016-12-01 MED ORDER — BUPIVACAINE LIPOSOME 1.3 % IJ SUSP
20.0000 mL | INTRAMUSCULAR | Status: DC
  Filled 2016-12-01: qty 20

## 2016-12-01 MED ORDER — LACTATED RINGERS IV SOLN
INTRAVENOUS | Status: DC | PRN
  Administered 2016-12-01 (×3): via INTRAVENOUS

## 2016-12-01 MED ORDER — LACTATED RINGERS IV SOLN: INTRAVENOUS | Status: DC

## 2016-12-01 MED ORDER — FENTANYL CITRATE (PF) 100 MCG/2ML IJ SOLN
INTRAMUSCULAR | Status: DC | PRN
  Administered 2016-12-01: 50 ug via INTRAVENOUS
  Administered 2016-12-01 (×2): 100 ug via INTRAVENOUS

## 2016-12-01 MED ORDER — ACETAMINOPHEN 500 MG PO TABS
1000.0000 mg | ORAL_TABLET | ORAL | Status: AC
  Administered 2016-12-01: 1000 mg via ORAL
  Filled 2016-12-01: qty 2

## 2016-12-01 MED ORDER — GLYCOPYRROLATE 0.2 MG/ML IV SOSY
PREFILLED_SYRINGE | INTRAVENOUS | Status: AC
  Filled 2016-12-01: qty 3

## 2016-12-01 MED ORDER — 0.9 % SODIUM CHLORIDE (POUR BTL) OPTIME
TOPICAL | Status: DC | PRN
  Administered 2016-12-01: 3000 mL

## 2016-12-01 MED ORDER — MENTHOL 3 MG MT LOZG: 1.0000 | LOZENGE | OROMUCOSAL | Status: DC | PRN

## 2016-12-01 MED ORDER — METOPROLOL TARTRATE 5 MG/5ML IV SOLN: 5.0000 mg | Freq: Four times a day (QID) | INTRAVENOUS | Status: DC | PRN

## 2016-12-01 MED ORDER — VITAMIN C 500 MG PO TABS
500.0000 mg | ORAL_TABLET | Freq: Two times a day (BID) | ORAL | Status: DC
  Administered 2016-12-01 – 2016-12-05 (×8): 500 mg via ORAL
  Filled 2016-12-01 (×8): qty 1

## 2016-12-01 MED ORDER — DIPHENHYDRAMINE HCL 50 MG/ML IJ SOLN
12.5000 mg | Freq: Four times a day (QID) | INTRAMUSCULAR | Status: DC | PRN
  Administered 2016-12-03: 12.5 mg via INTRAVENOUS
  Filled 2016-12-01: qty 1

## 2016-12-01 SURGICAL SUPPLY — 107 items
ADH SKN CLS APL DERMABOND .7 (GAUZE/BANDAGES/DRESSINGS) ×3
APPLIER CLIP 5 13 M/L LIGAMAX5 (MISCELLANEOUS)
APPLIER CLIP ROT 10 11.4 M/L (STAPLE)
APR CLP MED LRG 11.4X10 (STAPLE)
APR CLP MED LRG 5 ANG JAW (MISCELLANEOUS)
BLADE EXTENDED COATED 6.5IN (ELECTRODE) ×3 IMPLANT
CANNULA REDUC XI 12-8 STAPL (CANNULA) ×1
CANNULA REDUC XI 12-8MM STAPL (CANNULA) ×1
CANNULA REDUCER 12-8 DVNC XI (CANNULA) ×3 IMPLANT
CELLS DAT CNTRL 66122 CELL SVR (MISCELLANEOUS) IMPLANT
CHLORAPREP W/TINT 26ML (MISCELLANEOUS) ×5 IMPLANT
CLIP APPLIE 5 13 M/L LIGAMAX5 (MISCELLANEOUS) IMPLANT
CLIP APPLIE ROT 10 11.4 M/L (STAPLE) IMPLANT
CLIP LIGATING HEM O LOK PURPLE (MISCELLANEOUS) ×3 IMPLANT
CLIP LIGATING HEMO O LOK GREEN (MISCELLANEOUS) IMPLANT
COUNTER NEEDLE 20 DBL MAG RED (NEEDLE) ×5 IMPLANT
COVER MAYO STAND STRL (DRAPES) ×13 IMPLANT
COVER TIP SHEARS 8 DVNC (MISCELLANEOUS) ×3 IMPLANT
COVER TIP SHEARS 8MM DA VINCI (MISCELLANEOUS) ×2
DERMABOND ADVANCED (GAUZE/BANDAGES/DRESSINGS) ×2
DERMABOND ADVANCED .7 DNX12 (GAUZE/BANDAGES/DRESSINGS) ×1 IMPLANT
DEVICE TROCAR PUNCTURE CLOSURE (ENDOMECHANICALS) IMPLANT
DRAIN CHANNEL 19F RND (DRAIN) IMPLANT
DRAPE ARM DVNC X/XI (DISPOSABLE) ×12 IMPLANT
DRAPE COLUMN DVNC XI (DISPOSABLE) ×3 IMPLANT
DRAPE DA VINCI XI ARM (DISPOSABLE) ×8
DRAPE DA VINCI XI COLUMN (DISPOSABLE) ×2
DRAPE SURG IRRIG POUCH 19X23 (DRAPES) ×5 IMPLANT
DRSG OPSITE POSTOP 4X10 (GAUZE/BANDAGES/DRESSINGS) IMPLANT
DRSG OPSITE POSTOP 4X6 (GAUZE/BANDAGES/DRESSINGS) IMPLANT
DRSG OPSITE POSTOP 4X8 (GAUZE/BANDAGES/DRESSINGS) IMPLANT
DRSG TEGADERM 2-3/8X2-3/4 SM (GAUZE/BANDAGES/DRESSINGS) ×20 IMPLANT
DRSG TEGADERM 4X4.75 (GAUZE/BANDAGES/DRESSINGS) ×3 IMPLANT
ELECT REM PT RETURN 15FT ADLT (MISCELLANEOUS) ×5 IMPLANT
ENDOLOOP SUT PDS II  0 18 (SUTURE)
ENDOLOOP SUT PDS II 0 18 (SUTURE) IMPLANT
EVACUATOR SILICONE 100CC (DRAIN) IMPLANT
GAUZE SPONGE 2X2 8PLY STRL LF (GAUZE/BANDAGES/DRESSINGS) ×3 IMPLANT
GAUZE SPONGE 4X4 12PLY STRL (GAUZE/BANDAGES/DRESSINGS) ×3 IMPLANT
GLOVE ECLIPSE 8.0 STRL XLNG CF (GLOVE) ×15 IMPLANT
GLOVE INDICATOR 8.0 STRL GRN (GLOVE) ×15 IMPLANT
GOWN STRL REUS W/TWL XL LVL3 (GOWN DISPOSABLE) ×25 IMPLANT
GRASPER ENDOPATH ANVIL 10MM (MISCELLANEOUS) IMPLANT
HOLDER FOLEY CATH W/STRAP (MISCELLANEOUS) ×5 IMPLANT
IRRIG SUCT STRYKERFLOW 2 WTIP (MISCELLANEOUS) ×5
IRRIGATION SUCT STRKRFLW 2 WTP (MISCELLANEOUS) ×3 IMPLANT
KIT PROCEDURE DA VINCI SI (MISCELLANEOUS) ×2
KIT PROCEDURE DVNC SI (MISCELLANEOUS) ×3 IMPLANT
LEGGING LITHOTOMY PAIR STRL (DRAPES) ×5 IMPLANT
LUBRICANT JELLY K Y 4OZ (MISCELLANEOUS) ×3 IMPLANT
NDL INSUFFLATION 14GA 120MM (NEEDLE) ×2 IMPLANT
NEEDLE INSUFFLATION 14GA 120MM (NEEDLE) ×5 IMPLANT
PACK CARDIOVASCULAR III (CUSTOM PROCEDURE TRAY) ×5 IMPLANT
PACK COLON (CUSTOM PROCEDURE TRAY) ×5 IMPLANT
PAD POSITIONING PINK XL (MISCELLANEOUS) ×5 IMPLANT
PORT LAP GEL ALEXIS MED 5-9CM (MISCELLANEOUS) ×5 IMPLANT
RELOAD PROXIMATE 75MM BLUE (ENDOMECHANICALS) ×5 IMPLANT
RELOAD STAPLE 45 BLU REG DVNC (STAPLE) IMPLANT
RELOAD STAPLE 45 GRN THCK DVNC (STAPLE) IMPLANT
RELOAD STAPLE 75 3.8 BLU REG (ENDOMECHANICALS) IMPLANT
RETRACTOR WND ALEXIS 18 MED (MISCELLANEOUS) IMPLANT
RTRCTR WOUND ALEXIS 18CM MED (MISCELLANEOUS)
SCISSORS LAP 5X35 DISP (ENDOMECHANICALS) ×5 IMPLANT
SEAL CANN UNIV 5-8 DVNC XI (MISCELLANEOUS) ×9 IMPLANT
SEAL XI 5MM-8MM UNIVERSAL (MISCELLANEOUS) ×6
SEALER VESSEL DA VINCI XI (MISCELLANEOUS) ×6
SEALER VESSEL EXT DVNC XI (MISCELLANEOUS) ×5 IMPLANT
SET BI-LUMEN FLTR TB AIRSEAL (TUBING) ×5 IMPLANT
SET IRRIG TUBING LAPAROSCOPIC (IRRIGATION / IRRIGATOR) ×3 IMPLANT
SLEEVE ADV FIXATION 5X100MM (TROCAR) ×5 IMPLANT
SOLUTION ELECTROLUBE (MISCELLANEOUS) ×5 IMPLANT
SPONGE GAUZE 2X2 STER 10/PKG (GAUZE/BANDAGES/DRESSINGS) ×2
STAPLER 45 BLU RELOAD XI (STAPLE) ×3 IMPLANT
STAPLER 45 BLUE RELOAD XI (STAPLE) ×2
STAPLER 45 GREEN RELOAD XI (STAPLE) ×4
STAPLER 45 GRN RELOAD XI (STAPLE) ×6 IMPLANT
STAPLER CANNULA SEAL DVNC XI (STAPLE) ×3 IMPLANT
STAPLER CANNULA SEAL XI (STAPLE) ×2
STAPLER CIRC ILS CVD 25MM (STAPLE) ×3 IMPLANT
STAPLER SHEATH (SHEATH) ×2
STAPLER SHEATH ENDOWRIST DVNC (SHEATH) ×3 IMPLANT
SUT MNCRL AB 4-0 PS2 18 (SUTURE) ×5 IMPLANT
SUT PDS AB 1 CTX 36 (SUTURE) ×10 IMPLANT
SUT PDS AB 1 TP1 96 (SUTURE) IMPLANT
SUT PDS AB 2-0 CT2 27 (SUTURE) IMPLANT
SUT PROLENE 0 CT 2 (SUTURE) ×8 IMPLANT
SUT PROLENE 2 0 SH DA (SUTURE) IMPLANT
SUT SILK 2 0 (SUTURE) ×5
SUT SILK 2 0 SH CR/8 (SUTURE) ×5 IMPLANT
SUT SILK 2-0 18XBRD TIE 12 (SUTURE) ×3 IMPLANT
SUT SILK 3 0 (SUTURE) ×5
SUT SILK 3 0 SH CR/8 (SUTURE) ×5 IMPLANT
SUT SILK 3-0 18XBRD TIE 12 (SUTURE) ×3 IMPLANT
SUT V-LOC BARB 180 2/0GR6 GS22 (SUTURE)
SUT VIC AB 3-0 SH 18 (SUTURE) IMPLANT
SUT VIC AB 3-0 SH 27 (SUTURE)
SUT VIC AB 3-0 SH 27XBRD (SUTURE) IMPLANT
SUT VICRYL 0 UR6 27IN ABS (SUTURE) ×5 IMPLANT
SUTURE V-LC BRB 180 2/0GR6GS22 (SUTURE) IMPLANT
SYR 10ML LL (SYRINGE) ×5 IMPLANT
SYS LAPSCP GELPORT 120MM (MISCELLANEOUS)
SYSTEM LAPSCP GELPORT 120MM (MISCELLANEOUS) IMPLANT
TAPE UMBILICAL COTTON 1/8X30 (MISCELLANEOUS) ×5 IMPLANT
TOWEL OR 17X26 10 PK STRL BLUE (TOWEL DISPOSABLE) ×3 IMPLANT
TOWEL OR NON WOVEN STRL DISP B (DISPOSABLE) ×5 IMPLANT
TRAY FOLEY W/METER SILVER 16FR (SET/KITS/TRAYS/PACK) ×3 IMPLANT
TROCAR ADV FIXATION 5X100MM (TROCAR) ×3 IMPLANT

## 2016-12-01 NOTE — Anesthesia Procedure Notes (Signed)
Procedure Name: Intubation Date/Time: 12/01/2016 8:45 AM Performed by: Zayven Powe, Virgel Gess Pre-anesthesia Checklist: Patient identified, Emergency Drugs available, Suction available, Patient being monitored and Timeout performed Patient Re-evaluated:Patient Re-evaluated prior to inductionOxygen Delivery Method: Circle system utilized Preoxygenation: Pre-oxygenation with 100% oxygen Intubation Type: IV induction Ventilation: Mask ventilation without difficulty Laryngoscope Size: Mac and 4 Grade View: Grade II Tube type: Oral Tube size: 7.5 mm Number of attempts: 1 Airway Equipment and Method: Stylet Placement Confirmation: ETT inserted through vocal cords under direct vision,  positive ETCO2,  CO2 detector and breath sounds checked- equal and bilateral Secured at: 22 cm Tube secured with: Tape Dental Injury: Teeth and Oropharynx as per pre-operative assessment

## 2016-12-01 NOTE — Transfer of Care (Signed)
Immediate Anesthesia Transfer of Care Note  Patient: [name]  Procedure(s) Performed: Procedure(s): XI ROBOTIC ASSISTED PROCTOCOLECTOMY WITH ILLEOPOUCH ANASTAMOSIS WITH DIVERTING ILLEOSTOMY (N/A) RIGID PROCTOSCOPY (N/A)  Patient Location: PACU  Anesthesia Type:General  Level of Consciousness:  sedated, patient cooperative and responds to stimulation  Airway & Oxygen Therapy:Patient Spontanous Breathing and Patient connected to face mask oxgen  Post-op Assessment:  Report given to PACU RN and Post -op Vital signs reviewed and stable  Post vital signs:  Reviewed and stable  Last Vitals:  Vitals:   12/01/16 0552 12/01/16 1552  BP: 124/88   Pulse: 71   Resp: 16   Temp: 36.3 C (P) [id number] C    Complications: No apparent anesthesia complications

## 2016-12-01 NOTE — Anesthesia Postprocedure Evaluation (Signed)
Anesthesia Post Note  Patient: [name]  Procedure(s) Performed: Procedure(s) (LRB): XI ROBOTIC ASSISTED PROCTOCOLECTOMY WITH ILLEOPOUCH ANASTAMOSIS WITH DIVERTING ILLEOSTOMY (N/A) RIGID PROCTOSCOPY (N/A)  Patient location during evaluation: PACU Anesthesia Type: General Level of consciousness: awake and alert Pain management: pain level controlled Vital Signs Assessment: post-procedure vital signs reviewed and stable Respiratory status: spontaneous breathing, nonlabored ventilation, respiratory function stable and patient connected to nasal cannula oxygen Cardiovascular status: blood pressure returned to baseline and stable Postop Assessment: no signs of nausea or vomiting Anesthetic complications: no    Last Vitals:  Vitals:   12/01/16 1645 12/01/16 1716  BP: (!) 140/96 (!) 158/98  Pulse: 87 86  Resp: 15   Temp: 36.3 C 36.6 C    Last Pain:  Vitals:   12/01/16 1742  TempSrc:   PainSc: 7                  Tiajuana Amass

## 2016-12-01 NOTE — H&P (Signed)
[name] 11/02/2016 2:56 PM Location: [facility] Patient #: [id number] DOB: 12/06/1971 Married / Language: [name] / Race: White Male  Patient Care Team: [name] as PCP - General (General Practice) Tania Ade, RN as Registered Nurse Michael Boston, MD as Consulting Physician (General Surgery) Doran Stabler, MD as Consulting Physician (Gastroenterology) Kyung Rudd, MD as Consulting Physician (Radiation Oncology)   History of Present Illness Adin Hector MD; 11/02/2016 3:21 PM) The patient is a 45 year old male who presents with colorectal cancer. Note for "Colorectal cancer": Patient sent for surgical consultation at the request of Dr. Burr Medico. Rectal cancer.  Pleasant 45 y/o male. Had change in caliber of stools with worsening rectal bleeding and discomfort. Increasing concerns. Went to the emergency room in early August. Underwent colonoscopy by Dr. Loletha Carrow with [facility] gastroenterology. Found to have polyps in a circumferential rectal mass. Biopsy consistent with adenocarcinoma. Staged as a T3 N2 cancer by endoscopic ultrasound by Dr. Ardis Hughs. Recommendationn made for neoadjuvant chemoradiation therapy. This is been done with Xeloda and external beam. Drs. [name] and [place] presiding. He is due to finish his course this week. Discussed at GI tumor Board. Recommendation made for surgical consultation to consider resection. I believe attempt was made to see Dr. Marcello Moores with our group couple months ago but did not happen. Came to multidisciplinary clinic today  Patient notes he is no longer having any rectal bleeding. Pain with bowel movements though. No fecal incontinence. No accidents. Some hesitancy of urination. No hematuria. Her vomiting. Appetite good. He still smokes. He's got down to just a couple cigarettes a day. He struggled with some anal pain. Some relief with Preparation H. No incontinence of flatus or stool. Less rectal  bleeding.  He had genetic testing. Consistent with Lynch syndrome. I discussed with the patient's wife and the layer the patient on the phone. The like he needs completion proctocolectomy. They're agreeable to that. Then called back to talk more in the office.   Problem List/Past Medical Adin Hector, MD; 11/02/2016 3:11 PM) RECTAL ADENOCARCINOMA (C20)  PREOP COLON - ENCOUNTER FOR PREOPERATIVE EXAMINATION FOR GENERAL SURGICAL PROCEDURE (Z01.818)  TOBACCO ABUSE (Z72.0)   Other Problems Adin Hector, MD; 11/02/2016 3:11 PM) Anxiety Disorder  Colon Cancer  Gastroesophageal Reflux Disease  Hemorrhoids   Past Surgical History Adin Hector, MD; 11/02/2016 3:11 PM) Foot Surgery  Left. Oral Surgery  Tonsillectomy   Diagnostic Studies History Adin Hector, MD; 11/02/2016 3:11 PM) Colonoscopy  within last year  Allergies Mammie Lorenzo, LPN; 13/07/6577 4:69 PM) No Known Drug Allergies 11/02/2016  Medication History Mammie Lorenzo, LPN; 62/95/2841 3:24 PM) Neomycin Sulfate ('500MG'$  Tablet, 2 (two) Tablet Oral SEE NOTE, Taken starting 09/24/2016) Active. (TAKE TWO TABLETS AT 2 PM, 3 PM, AND 10 PM THE DAY PRIOR TO SURGERY) Flagyl ('500MG'$  Tablet, 2 (two) Tablet Oral SEE NOTE, Taken starting 09/24/2016) Active. (Take at 2pm, 3pm, and 10pm the day prior to your colon operation) Xanax (0.'5MG'$  Tablet, Oral) Active. OxyCODONE HCl ('5MG'$  Tablet, Oral) Active. Compazine ('10MG'$  Tablet, Oral) Active. Ibuprofen ('400MG'$  Tablet, Oral) Active. Docusate Sodium ('100MG'$  Capsule, Oral) Active. Xeloda ('150MG'$  Tablet, Oral) Active. Medications Reconciled  Social History Adin Hector, MD; 11/02/2016 3:11 PM) Caffeine use  Carbonated beverages. No alcohol use  No drug use  Tobacco use  Current every day smoker.  Family History Adin Hector, MD; 11/02/2016 3:11 PM) Colon Cancer  Family Members In General. Diabetes Mellitus  Mother. Hypertension  Mother. Kidney  Disease  Mother.    Review of Systems Adin Hector, MD; 11/02/2016 3:11 PM) General Present- Appetite Loss, Weight Gain and Weight Loss. Not Present- Chills, Fatigue, Fever and Night Sweats. Skin Present- Rash. Not Present- Change in Wart/Mole, Dryness, Hives, Jaundice, New Lesions, Non-Healing Wounds and Ulcer. HEENT Not Present- Earache, Hearing Loss, Hoarseness, Nose Bleed, Oral Ulcers, Ringing in the Ears, Seasonal Allergies, Sinus Pain, Sore Throat, Visual Disturbances, Wears glasses/contact lenses and Yellow Eyes. Respiratory Not Present- Bloody sputum, Chronic Cough, Difficulty Breathing, Snoring and Wheezing. Breast Not Present- Breast Mass, Breast Pain, Nipple Discharge and Skin Changes. Cardiovascular Not Present- Chest Pain, Difficulty Breathing Lying Down, Leg Cramps, Palpitations, Rapid Heart Rate, Shortness of Breath and Swelling of Extremities. Gastrointestinal Present- Change in Bowel Habits, Chronic diarrhea, Constipation, Excessive gas, Hemorrhoids and Rectal Pain. Not Present- Abdominal Pain, Bloating, Bloody Stool, Difficulty Swallowing, Gets full quickly at meals, Indigestion, Nausea and Vomiting. Male Genitourinary Present- Painful Urination. Not Present- Blood in Urine, Change in Urinary Stream, Frequency, Impotence, Nocturia, Urgency and Urine Leakage. Neurological Not Present- Decreased Memory, Fainting, Headaches, Numbness, Seizures, Tingling, Tremor, Trouble walking and Weakness. Psychiatric Present- Anxiety. Not Present- Bipolar, Change in Sleep Pattern, Depression, Fearful and Frequent crying. Endocrine Not Present- Cold Intolerance, Excessive Hunger, Hair Changes, Heat Intolerance and New Diabetes. Hematology Not Present- Blood Thinners, Easy Bruising, Excessive bleeding, Gland problems, HIV and Persistent Infections.  Vitals Claiborne Billings Dockery LPN; 32/44/0102 7:25 PM) 11/02/2016 2:57 PM Weight: 152 lb Temp.: 59F(Oral)  Pulse: 65 (Regular)  BP: 118/70  (Sitting, Left Arm, Standard)  BP 124/88   Pulse 71   Temp 97.4 F (36.3 C) (Oral)   Resp 16   Ht '5\' 11"'$  (1.803 m)   Wt 70.4 kg (155 lb 4.8 oz)   SpO2 100%   BMI 21.66 kg/m       Physical Exam Adin Hector MD; 11/02/2016 3:55 PM) General Mental Status-Alert. General Appearance-Not in acute distress, Not Sickly. Orientation-Oriented X3. Hydration-Well hydrated. Voice-Normal. Note: Thin. Not cachectic   Integumentary Global Assessment Normal Exam - Axillae: non-tender, no inflammation or ulceration, no drainage. and Distribution of scalp and body hair is normal. General Characteristics Temperature - normal warmth is noted.  Head and Neck Head-normocephalic, atraumatic with no lesions or palpable masses. Face Global Assessment - atraumatic, no absence of expression. Neck Global Assessment - no abnormal movements, no bruit auscultated on the right, no bruit auscultated on the left, no decreased range of motion, non-tender. Trachea-midline. Thyroid Gland Characteristics - non-tender.  Eye Eyeball - Left-Extraocular movements intact, No Nystagmus. Eyeball - Right-Extraocular movements intact, No Nystagmus. Cornea - Left-No Hazy. Cornea - Right-No Hazy. Sclera/Conjunctiva - Left-No scleral icterus, No Discharge. Sclera/Conjunctiva - Right-No scleral icterus, No Discharge. Pupil - Left-Direct reaction to light normal. Pupil - Right-Direct reaction to light normal.  ENMT Ears Pinna - Left - no drainage observed, no generalized tenderness observed. Right - no drainage observed, no generalized tenderness observed. Nose and Sinuses Nose - no destructive lesion observed. Nares - Left - quiet respiration. Right - quiet respiration. Mouth and Throat Lips - Upper Lip - no fissures observed, no pallor noted. Lower Lip - no fissures observed, no pallor noted. Nasopharynx - no discharge present. Oral Cavity/Oropharynx - Tongue - no dryness  observed. Oral Mucosa - no cyanosis observed. Hypopharynx - no evidence of airway distress observed.  Chest and Lung Exam Inspection Movements - Normal and Symmetrical. Accessory muscles - No use of accessory muscles in breathing. Palpation Normal  exam - Non-tender. Auscultation Breath sounds - Normal and Clear.  Cardiovascular Auscultation Rhythm - Regular. Murmurs & Other Heart Sounds - Normal exam - No Murmurs and No Systolic Clicks.  Abdomen Inspection Normal Exam - No Visible peristalsis and No Abnormal pulsations. Umbilicus - No Bleeding, No Urine drainage. Palpation/Percussion Normal exam - Soft, Non Tender, No Rebound tenderness, No Rigidity (guarding) and No Cutaneous hyperesthesia. Note: Abdomen soft. Nontender, nondistended. No guarding. No umbilical no other hernias   Male Genitourinary Sexual Maturity Tanner 5 - Adult hair pattern and Adult penile size and shape. Note: No inguinal hernias. Normal external genitalia. Epididymi, testes, and spermatic cords normal without any masses.   Rectal Note: Left lateral thrombosed hemorrhoid less than a centimeter. Not erupted/ulcerated. normal sphincter tone. Rectal mass 4 cm from posterior anal verge at most distal point. Then is more circumferential a little more proximally  Normal sphincter tone. Sensitive but can tolerate digital exam. No fissure. No fistula. No abscess. Prostate clear.   Peripheral Vascular Upper Extremity Inspection - Left - No Cyanotic nailbeds, Not Ischemic. Right - No Cyanotic nailbeds, Not Ischemic.  Neurologic Neurologic evaluation reveals -normal attention span and ability to concentrate, able to name objects and repeat phrases. Appropriate fund of knowledge , normal sensation and normal coordination. Mental Status Affect - not angry, not paranoid. Cranial Nerves-Normal Bilaterally. Gait-Normal.  Neuropsychiatric Mental status exam performed with findings of-able to  articulate well with normal speech/language, rate, volume and coherence, thought content normal with ability to perform basic computations and apply abstract reasoning and no evidence of hallucinations, delusions, obsessions or homicidal/suicidal ideation.  Musculoskeletal Global Assessment Spine, Ribs and Pelvis - no instability, subluxation or laxity. Right Upper Extremity - no instability, subluxation or laxity.  Lymphatic Head & Neck General Head & Neck Lymphatics: Bilateral - Description - No Localized lymphadenopathy. Axillary General Axillary Region: Bilateral - Description - No Localized lymphadenopathy. Femoral & Inguinal Generalized Femoral & Inguinal Lymphatics: Left: Right - Description - No Localized lymphadenopathy. Description - No Localized lymphadenopathy.    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 I would recommend completion proctocolectomy as prophylaxis against high likelihood of future colorectal cancers. Discussed at length.  RECTAL ADENOCARCINOMA (C20) Impression: Rectal cancer 4cm from anal verge s/p chemoXRT. 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 Still requires low anterior resection for cancer. Needs completion colectomy for Lymch syndrome. Permanent ileostomy versus attempted J-pouch ileal pouch anal anastomosis with protective diverting loop ileostomy.   I am more optimistic that I can do sphincter-sparing surgery on him. Would still like to try and do this minimally invasively/robotically. Rectal feels retracted compared to prior studies where was within 1 cm of the anal verge. That is encouraging that neoadjuvant chemotherapy radiation therapy has shrink this tumor down.  Hopefully much more likely low LAR with IPAA J-pouch and diverting loop ileostomy. Again I noted that he can get a second opinion at another major academic center. Other places that ileal pouch  is in a more high-volume setting such as [facility]. However they feel comfortable with [place] and wish to proceed. Planning help with my colorectal partner, Dr. Marcello Moores whom pouch IPAA pouches as well.  I strongly recommend he completely quit smoking. I'll glad he is down to 2-3 cigs/day.  Would plan surgery 10 weeks after completion of neoadjuvant chemotherapy radiation therapy = keep date mid December. Preoperative ostomy marking. THROMBOSED HEMORRHOIDS (K64.5) Impression: Thrombosed hemorrhoids. It's been over a week.  It seems to be getting better. I would resist doing any incision on it, especially in someone will need a very low anastomosis. Try and preserve the skin. Continue conservative care as it is gradually getting better.  The anatomy & physiology of the anorectal region was discussed. The pathophysiology of hemorrhoids and differential diagnosis was discussed. Natural history progression was discussed. I stressed the importance of a bowel regimen to have daily soft bowel movements to minimize progression of disease. Goal of one BM / day ideal. Use of wet wipes, warm baths, avoiding straining, etc were emphasized.  Educational handouts further explaining the pathology, treatment options, and bowel regimen were given as well. The patient expressed understanding. Current Plans Pt Education - [facility] Hemorrhoids (Shuna Tabor): discussed with patient and provided information. Pt Education - [facility] Rectal Surgery HCI (Thatiana Renbarger): discussed with patient and provided information. PREOP COLON - ENCOUNTER FOR PREOPERATIVE EXAMINATION FOR GENERAL SURGICAL PROCEDURE (Z01.818) Current Plans You are being scheduled for surgery - Our schedulers will call you.  You should hear from our office's scheduling department within 5 working days about the location, date, and time of surgery. We try to make accommodations for patient's preferences in scheduling surgery, but sometimes the OR schedule or the surgeon's schedule prevents [place]  from making those accommodations.  If you have not heard from our office [phone]) in 5 working days, call the office and ask for your surgeon's nurse.  If you have other questions about your diagnosis, plan, or surgery, call the office and ask for your surgeon's nurse.  Written instructions provided Pt Education - [facility] Colectomy post-op instructions: discussed with patient and provided information. Pt Education - [facility] Pain Control (Joevon Holliman) TOBACCO ABUSE (Z72.0) Current Plans Pt Education - [facility] STOP SMOKING!  Adin Hector, M.D., F.A.C.S. Gastrointestinal and Minimally Invasive Surgery [facility] [address], [place], [place] [address] [phone] Main / Paging

## 2016-12-01 NOTE — Op Note (Signed)
12/01/2016  3:42 PM  PATIENT:  [name]  45 y.o. 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)  PROCEDURE:   XI ROBOTIC ASSISTED PROCTOCOLECTOMY ILEAL "J" POUCH ANAL ANASTAMOSIS (IPAA) DIVERTING LOOP ILEOSTOMY RIGID PROCTOSCOPY  SURGEON:  Adin Hector, MD, FACS, MASCRS  ASSISTANT: Leighton Ruff, MD, FACS, FASCRS  ANESTHESIA:   General, Local anesthetic as a field block and anorectal and perianal field block}  EBL:  Total I/O In: 4250 [I.V.:4000; IV Piggyback:250] Out: 700 [Urine:500; Blood:200] "see anesthesia record"  Delay start of Pharmacological VTE agent (>24hrs) due to surgical blood loss or risk of bleeding:  no  DRAINS: 19 Fr Blake drain in the pelvis   SPECIMEN:  Source of Specimen:  1.  COLON AND RECTUM   2.  DISTAL ANASTOMOTIC RING  DISPOSITION OF SPECIMEN:  PATHOLOGY  COUNTS:  YES  PLAN OF CARE: Admit to inpatient   PATIENT DISPOSITION:  PACU - hemodynamically stable.  INDICATION:    Patient with bowel changes and found to have very distal bulky rectal cancer.  Genetic studies showed Lynch syndrome.  Recommend patient made for low anterior resection.  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  Offered ileal pouch anal anastomosis with diverting loop ileostomy.  Possible permanent ileostomy discussed.    The anatomy & physiology of the digestive tract was discussed.  The pathophysiology was discussed.  Natural history risks without surgery was discussed.   I worked to give an  overview of the disease and the frequent need to have multispecialty involvement.  I feel the risks of no intervention will lead to serious problems that outweigh the operative risks; therefore, I recommended a partial colectomy to remove the pathology.  Laparoscopic & open techniques were discussed.    Risks such as bleeding, infection, abscess, leak, reoperation, possible ostomy, hernia, impotence, incontinence, heart attack, death, and other risks were discussed.  I noted a good likelihood this will help address the problem.   Goals of post-operative recovery were discussed as well.  We will work to minimize complications.  Educational materials on the pathology had been given in the office.  Questions were answered.    The patient expressed understanding & wished to proceed with surgery.  OR FINDINGS:   Patient had a bulky scarring consistent with rectal cancer involving the distal half of the rectum.  Scar 3-4cm from the anal verge.  No obvious metastatic disease on visceral parietal peritoneum or liver.  He has a ileal J-pouch.  15 cm long.  25 EEA stapled to the anal canal.  Ring at just above the sphincters.  Small anterior leak.  The anastomosis rests 0 cm from the anal verge by rigid proctoscopy.  Diverting loop ileostomy.  PROCEDURE:  Informed consent was confirmed.  The patient underwent general anaesthesia without difficulty.  The patient was positioned appropriately.  VTE prevention in place.  The patient's abdomen was clipped, prepped, & draped in a sterile fashion.  Surgical timeout confirmed our plan.  The patient was positioned in reverse Trendelenburg.  Abdominal entry was gained using  optical entry technique in the  right upper abdomen.  Entry was clean.  I induced carbon dioxide insufflation.  Camera inspection revealed no injury.  Extra ports were carefully placed under direct laparoscopic visualization.  I reflected the greater omentum and the upper abdomen the small  bowel in the upper abdomen.  The patient was carefully positioned.  The Intuitive daVinci robot was carefully docked with camera & instruments carefully placed.  The patient had No evidence of visceral or parietal peritoneal metastatic disease.  No evidence of metastasis.    Patient had a corkscrew rectosigmoid colon.  I freed off a few adhesions off the lateral pelvic sidewall to help straighten that out.  I scored the base of peritoneum of the medial side of the mesentery of the left colon from the ligament of Treitz to the peritoneal reflection of the mid rectum.   I elevated the sigmoid mesentery and entered into the retro-mesenteric plane. We were able to identify the left ureter and gonadal vessels. We kept those posterior within the retroperitoneum and elevated the left colon mesentery off that. I did isolated IMA pedicle but did not ligate it yet.  I continued distally and got into the avascular plane posterior to the mesorectum. This allowed me to help mobilize the rectum as well by freeing the mesorectum off the sacrum.  I mobilized the peritoneal coverings towards the peritoneal reflection on both the right and left sides of the rectum.  I stayed away from the right and left ureters.  I kept the lateral vascular pedicles to the rectum intact.  We proceeded with total mesorectal excision.  Gradually exposed the rectum and freed it off its attachments to the pelvis.  Initially focused posteriorly.  The neurovascular bundles intact and reflected posteriorly.  He did have some edema and thickening consistent with neoadjuvant radiation therapy.  I was able to score around the peritoneal reflection which was rather low.  I was able to continue dissection more distally, alternating between posterior, anterior, and lateral dissection.  I freed the rectum off the seminal vesicles and prostate anteriorly.  Came around the pedicles laterally.  Eventually was able to come down to the levator pelvic floor to where  the rectum was turning into the anal canal at the level of the sphincter complex.  There is inflammation and thickening especially in the distal rectum but the last few centimeters of rectum more towards anal canal was much more soft and less inflamed.  I did digital exam to confirm that I had dissected 2 cm distal to the most distal scar and a few more centers from the remaining bulk of tumor which was primarily left posterior lateral and not so much anterior.  I stapleed off flush with the pelvic floor with a green load robotic stapler 2 firings.  .   I then continued dissecting the left colon.  Elevated the rectosigmoid mesentery anteriorly.  The left ureter and gonadal vessels in situ the retroperitoneum.  Isolated the pedicles on the left side.  Superior hemorrhoidal, left colic, inferior mesenteric vein pedicles.  Skeletonized and transected those with a vessel sealer.  That way could ensure a good high ligation on the rectal cancer pedicles.   Eventually freed the left colon off the retroperitoneum and left kidney.  Start coming around the splenic flexure.  Had to reposition and redock.  He had rather redundant floppy small bowel in the way.  Eventually got out away.  Splint flexure was rather densely adherent to the  splenic hilum but eventually came away from it layer by layer.  I came through the transverse colon.  Freed greater omentum off this and follow that more distally to help mobilize slight flexure and a superior to inferior fashion as well.  Once I completely freed off the greater omentum, I worked on transecting the mesentery of the descending colon splenic flexure and transverse colon.  Gradually did this from the splenic flexure towards the hepatic flexure.  Redocked again.   Mobilize the hepatic flexure a similar mirror image fashion.  Freed the right colon off the duodenal sweep.  His terminal ileum serosa and mesentery had some radiation changes to more viable.  Somewhat corkscrewed.   Somewhat inflamed but eventually was able to mobilize it in a medial to lateral fashion.  We eviscerated the rectum to the cecum of the premarked right paramedian ileostomy port site after expanding and doing a wound protector.  Mesorectum excision was clean.  The mesentery would not come up very well.  Eventually we took some of the pedicle with clamps and silk ties until we could staple off at the ileocecal junction.  Returned the ileum back into the peritoneum and redocked the robot.  After completely mobilizing the  terminal ileal mesentery laterally and off the retroperitoneum, I was able to better mobilize the terminal ileum.  As him to come through preserve mesenteric collaterals by transecting the proximal transverse colon mesentery radially and it coming more proximally to free off the terminal ileal mesentery off the retroperitoneum and skeletonized  and transected the ileocecal pedicle.   the ileal mesentery was rather thickened.  Had to use a vessel sealer to get hemostasis.  With that in straightening I could get much better mobility of the terminal ileum mesentery.  It would reach down the pelvis easily without tension.  Therefore we felt that probably would be a candidate for a ileal pouch.  We eviscerated the ileum.  Came up much more easily.  We folded upon itself and stapled off a 15 cm long ileal J-pouch using three firings of the 75 GIA stapler.  Did this through a slightly eccentric antimesenteric opening.  I oversewed the distal ileal staple line with interrupted silk sutures to imbricate the distal end of the pouch staple line.  Also tacked the end of the tail of the J-pouch to the opposite mesentery to avoid any potential mesenteric defects.  Tissues looked pink and viable.  The terminal ileum did have some brown discoloration consistent with radiation changes but had good healthy bleeding mucosa.  Placed a 25 EEA stapler anvil in this staple opening and closed it with a Prolene  pursestring and free-tie sutures.  Returned back into the abdomen.  I ran the small bowel to straighten the mesentery.  The Ileal J pouch easily reached down into the distal pelvis.  Ileal mesentery was nice and straight from the duodenal sweep down to the pelvis without any twisting or turning.  Dr. Marcello Moores scrubbed down and placed a 25 EEA stapler up the anus into the anal canal staple line.  Brought the spike out.  I attached the anvil of the ileal pouch to the spike of the EEA stapler.  She crimped that down and held for 60 seconds.  Fired and held it for 30 seconds.  Released.  Got two good anastomotic rings.  Distal ring essentially anal canal.  Did have a little knuckle of pelvic floor muscle.  We did do an air leak test and there  were some small bubbling coming anteriorly.  Rigid proctoscopy digital exam noted 90% good staple line but anterior midline with some breakdown suspicious for a small leak.  I ran the small bowel to make sure there is no twisting or turning.  Torsion.  Found area about 20 cm proximal to the pouch that reached up to the premarked ileostomy site.  Drain.  Secured the skin.  As the remaining greater omentum and pedicle lysed it so that the tip could reach down into the anterior pelvis to help cover protect that anastomosis anteriorly at least.  Did a final irrigation of antibiotic solution  clindamycin/gentamicin.  inspection.  Hemostasis good.  Bowel rest and well.  No twisting or torsion.  I evacuated carbon dioxide.  I closed the skin of the remaining port sites with Monocryl suture.  Dermabond.  Did PDS sutures to help close the fascia of the loop ileostomy down more snug to about 3 cm.  Also closeed the skin back down to a smaller size with some lateral deep dermal interrupted Monocryl sutures and covered with Dermabond.  I matured the loop ileostomy with interrupted 2-0 Vicryl sutures in a Brooke fashion.  The larger superior stoma is the proximal end.  Smaller flatter opening  for the distal end of the loop at 6:00/inferiorly.  Appliance applied.  The patient  was extubated to the recovery room.  He is in stable condition talking.  I discussed at length with the patient's wife and mother-in-law.  I discussed operative findings, updated the patient's status, discussed probable steps to recovery, and gave postoperative recommendations.  Questions were answered.  They expressed understanding & appreciation.  Adin Hector, M.D., F.A.C.S. Gastrointestinal and Minimally Invasive Surgery [facility] [address], [place], [place] [address] [phone] Main / Paging

## 2016-12-01 NOTE — Interval H&P Note (Signed)
History and Physical Interval Note:  12/01/2016 8:11 AM  [name]  has presented today for surgery, with the diagnosis of low rectal cancer  The various methods of treatment have been discussed with the patient and family. After consideration of risks, benefits and other options for treatment, the patient has consented to  Procedure(s): XI ROBOTIC ASSISTED TOTAL ABDOMINAL PROCTO COLECTOMY WITH ILLEOJEJUNOSTOMY POUCH WITH DIVERTING ILLEOSTOMY (N/A) ILEOSTOMY (N/A) RIGID PROCTOSCOPY (N/A) as a surgical intervention .  The patient's history has been reviewed, patient examined, no change in status, stable for surgery.  I have reviewed the patient's chart and labs.  Questions were answered to the patient's satisfaction.     Clariza Sickman C.

## 2016-12-02 DIAGNOSIS — K91858 Other complications of intestinal pouch: Secondary | ICD-10-CM

## 2016-12-02 LAB — BASIC METABOLIC PANEL
Anion gap: 7 (ref 5–15)
BUN: 8 mg/dL (ref 6–20)
CALCIUM: 8 mg/dL — AB (ref 8.9–10.3)
CHLORIDE: 106 mmol/L (ref 101–111)
CO2: 24 mmol/L (ref 22–32)
CREATININE: 1.09 mg/dL (ref 0.61–1.24)
GFR calc non Af Amer: >60 mL/min (ref >60)
GLUCOSE: 151 mg/dL — AB (ref 65–99)
Potassium: 4 mmol/L (ref 3.5–5.1)
Sodium: 137 mmol/L (ref 135–145)

## 2016-12-02 LAB — CBC
HCT: 37.2 % — ABNORMAL LOW (ref 39.0–52.0)
Hemoglobin: 12.7 g/dL — ABNORMAL LOW (ref 13.0–17.0)
MCH: 31.2 pg (ref 26.0–34.0)
MCHC: 34.1 g/dL (ref 30.0–36.0)
MCV: 91.4 fL (ref 78.0–100.0)
PLATELETS: 260 10*3/uL (ref 150–400)
RBC: 4.07 MIL/uL — AB (ref 4.22–5.81)
RDW: 14.7 % (ref 11.5–15.5)
WBC: 13.9 10*3/uL — ABNORMAL HIGH (ref 4.0–10.5)

## 2016-12-02 LAB — MAGNESIUM: MAGNESIUM: 1.6 mg/dL — AB (ref 1.7–2.4)

## 2016-12-02 MED ORDER — SODIUM CHLORIDE 0.9% FLUSH
10.0000 mL | INTRAVENOUS | Status: DC | PRN
  Administered 2016-12-04 – 2016-12-05 (×3): 10 mL
  Filled 2016-12-02 (×3): qty 40

## 2016-12-02 MED ORDER — MAGNESIUM SULFATE 2 GM/50ML IV SOLN
2.0000 g | Freq: Once | INTRAVENOUS | Status: AC
  Administered 2016-12-02: 2 g via INTRAVENOUS
  Filled 2016-12-02: qty 50

## 2016-12-02 NOTE — Progress Notes (Signed)
Peripherally Inserted Central Catheter/Midline Placement  The IV Nurse has discussed with the patient and/or persons authorized to consent for the patient, the purpose of this procedure and the potential benefits and risks involved with this procedure.  The benefits include less needle sticks, lab draws from the catheter, and the patient may be discharged home with the catheter. Risks include, but not limited to, infection, bleeding, blood clot (thrombus formation), and puncture of an artery; nerve damage and irregular heartbeat and possibility to perform a PICC exchange if needed/ordered by physician.  Alternatives to this procedure were also discussed.  B[facility] Power PICC patient education guide, fact sheet on infection prevention and patient information card has been provided to patient /or left at bedside.    PICC/Midline Placement Documentation        [name] 12/02/2016, 2:37 PM Consent obtained by Claretha Cooper, RN

## 2016-12-02 NOTE — Consult Note (Addendum)
[facility] Nurse ostomy consult note Stoma type/location: RUQ ileostomy.Post op pouch from yesterday is leaking beneath skin barrier at 3 and 9 o'clock. Stomal assessment/size: Oval, budded red, moist.  1 and 3/8 x 1 and 3/4 inch  Peristomal assessment: Intact, but with effluent on skin (pouch leaking) Treatment options for stomal/peristomal skin: skin barrier ring placed Output: Brown effluent (liquid) Ostomy pouching: 2pc. 2 and 1/4 inch pouching system with skin barrier ring applied today; I will see tomorrow and over weekend to determine if this is going to be the system in which he will return home. Education provided: Patient and daughter shown pouch removal, stoma measuring, placement of skin barrier ring and pouch preparation.  Discussed A&P of GI tract, including stoma characteristics.  Discussed pouch characteristics, including Lock and Roll closure.  Patient is able to demonstrate this closure mechanism with minimal cueing. Making of toilet paper wicks for the cleansing of the bottom 2-inches of the pouch tail closure demonstrated.  Educational booklet provided. Enrolled patient in [facility] program: No as I am awaiting determination of supplies needed.  Signature card signed. [facility] nursing team will follow, and will remain available to this patient, the nursing, surgical and medical teams.   Thanks, Maudie Flakes, MSN, RN, [place], Arther Abbott  Pager# [phone]

## 2016-12-02 NOTE — Progress Notes (Signed)
Spoke with primary RN regarding the need for a PICC for this pt.    She is going to clarify with MD need for PICC.    Has a peripheral IV that is working fine.

## 2016-12-02 NOTE — Progress Notes (Signed)
Pharmacy Brief Note - Alvimopan (Entereg)  The standing order set for alvimopan (Entereg) now includes an automatic order to discontinue the drug after the patient has had a bowel movement. The change was approved by the [facility] and the Medical Executive Committee.  This patient has had a bowel movement documented by nursing. Therefore, alvimopan has been discontinued. If there are questions, please contact the pharmacy at [phone].  Thank you   Reuel Boom, PharmD, BCPS Pager: [phone] 12/02/2016, 1:16 PM

## 2016-12-02 NOTE — Discharge Instructions (Signed)
Lynch syndrome screening recs (from up to date)  Annual colonoscopy starting between the ages of 39 and 30 years, or 10 years prior to the earliest age of colon cancer diagnosis in the family (whichever comes first). In families with MSH6 mutations, screening can start at age 46 years since the onset of colon cancer is later in these families.  Annual screening for endometrial cancer with endometrial biopsy and ovarian cancer with CA 125 and transvaginal ultrasound beginning at age 76 to 53 years, or 5 to 10 years earlier than the earliest age of first diagnosis of these cancers in the family (whichever is earlier). Discussion of prophylactic hysterectomy and salpingo-oophorectomy at the end of childbearing years.  Annual urinalysis beginning at age 10 to 98 years.  Annual skin surveillance.  Periodic upper endoscopy  Ostomy Support Information  Theresia Majors heard that people get along just fine with only one of their eyes, or one of their lungs, or one of their kidneys. But you also know that you have only one intestine and only one bladder, and that leaves you feeling awfully empty, both physically and emotionally: You think no other people go around without part of their intestine with the ends of their intestines sticking out through their abdominal walls.   YOU ARE NOT ALONE.  There are nearly three quarters of a million people in the [place] who have an ostomy; people who have had surgery to remove all or part of their colons or bladders.   There is even a national association, the [place] [employer] [place] with over 350 local affiliated support groups that are organized by volunteers who provide peer support and counseling. Juan Quam has a toll free telephone num-ber, [phone] and an educational, interactive website, www.ostomy.org   An ostomy is an opening in the belly (abdominal wall) made by surgery. Ostomates are people who have had this procedure. The opening (stoma) allows the kidney or  bowel to discharge waste. An external pouch covers the stoma to collect waste. Pouches are are a simple bag and are odor free. Different companies have disposable or reusable pouches to fit one's lifestyle. An ostomy can either be temporary or permanent.   THERE ARE THREE MAIN TYPES OF OSTOMIES  Colostomy. A colostomy is a surgically created opening in the large intestine (colon).  Ileostomy. An ileostomy is a surgically created opening in the small intestine.  Urostomy. A urostomy is a surgically created opening to divert urine away from the bladder.  OSTOMY Care  The following guidelines will make care of your colostomy easier. Keep this information close by for quick reference.  Helpful DIET hints Eat a well-balanced diet including vegetables and fresh fruits. Eat on a regular schedule. Drink at least 6 to 8 glasses of fluids daily. Eat slowly in a relaxed atmosphere. Chew your food thoroughly. Avoid chewing gum, smoking, and drinking from a straw. This will help decrease the amount of air you swallow, which may help reduce gas. Eating yogurt or drinking buttermilk may help reduce gas.  To control gas at night, do not eat after 8 p.m. This will give your bowel time to quiet down before you go to bed.  If gas is a problem, you can purchase Beano. Sprinkle Beano on the first bite of food before eating to reduce gas. It has no flavor and should not change the taste of your food. You can buy Beano over the counter at your local drugstore.  Foods like fish, onions, garlic, broccoli, asparagus, and  cabbage produce odor. Although your pouch is odor-proof, if you eat these foods you may notice a stronger odor when emptying your pouch. If this is a concern, you may want to limit these foods in your diet.  If you have an ileostomy, you will have chronic diarrhea & need to drink more liquids to avoid getting dehydrated.  Consider antidiarrheal medicine like imodium (loperamide) or Lomotil to help  slow down bowel movements / diarrhea into your ileostomy bag.  GETTING TO GOOD BOWEL HEALTH WITH AN ILEOSTOMY . Irregular bowel habits such as constipation and diarrhea can lead to many problems over time.  The goal: 3-6 small BOWEL MOVEMENTS A DAY!  To have soft, regular bowel movements:   Drink plenty of fluids, consider 4-6 tall glasses of water a day.    Controlling diarrheA  o Switch to liquids and simpler foods for a few days to avoid stressing your intestines further. o Avoid dairy products (especially milk & ice cream) for a short time.  The intestines often can lose the ability to digest lactose when stressed. o Avoid foods that cause gassiness or bloating.  Typical foods include beans and other legumes, cabbage, broccoli, and dairy foods.  Every person has some sensitivity to other foods, so listen to our body and avoid those foods that trigger problems for you. o Adding fiber (Citrucel, Metamucil, psyllium, Miralax) gradually can help thicken stools by absorbing excess fluid and retrain the intestines to act more normally.  Slowly increase the dose over a few weeks.  Too much fiber too soon can backfire and cause cramping & bloating. o Probiotics (such as active yogurt, Align, etc) may help repopulate the intestines and colon with normal bacteria and calm down a sensitive digestive tract.  Most studies show it to be of mild help, though, and such products can be costly. o Medicines: - Bismuth subsalicylate (ex. Kayopectate, Pepto Bismol) every 30 minutes for up to 6 doses can help control diarrhea.  Avoid if pregnant. - Loperamide (Immodium) can slow down diarrhea.  Start with two tablets ('4mg'$  total) first and then try one tablet every 6 hours.  Avoid if you are having fevers or severe pain.  If you are not better or start feeling worse, stop all medicines and call your doctor for advice o Call your doctor if you are getting worse or not better.  Sometimes further testing (cultures,  endoscopy, X-ray studies, bloodwork, etc) may be needed to help diagnose and treat the cause of the diarrhea.  TROUBLESHOOTING IRREGULAR BOWELS 1) Avoid extremes of bowel movements (no bad constipation/diarrhea) 2) Miralax 17gm mixed in 8oz. water or juice-daily. May use twice a day as needed  3) Gas-x,Phazyme, etc. as needed for gas & bloating.  4) Soft,bland diet. No spicy,greasy,fried foods.  5) Prilosec (omeprazole) over-the-counter as needed  6) May hold gluten/wheat products from diet to see if symptoms improve.  7)  May try probiotics (Align, Activa, etc) to help calm the bowels down 7) If symptoms become worse call back immediately.   Applying the pouching system To apply your pouch, follow these steps:  Place all your equipment close at hand before removing your pouch.  Wash your hands.  Stand or sit in front of a mirror. Use the position that works best for you. Remember that you must keep the skin around the stoma wrinkle-free for a good seal.  Gently remove the used pouch (1-piece system) or the pouch and old wafer (2-piece system). Empty the pouch into  the toilet. Save the closure clip to use again.  Wash the stoma itself and the skin around the stoma. Your stoma may bleed a little when being washed. This is normal. Rinse and pat dry. You may use a wash cloth or soft paper towels (like Bounty), mild soap (like Dial, Safeguard, or [place]), and water. Avoid soaps that contain perfumes or lotions.  For a new pouch (1-piece system) or a new wafer (2-piece system), measure your stoma using the stoma guide in each box of supplies.  Trace the shape of your stoma onto the back of the new pouch or the back of the new wafer. Cut out the opening. Remove the paper backing and set it aside.  Optional: Apply a skin barrier powder to surrounding skin if it is irritated (bare or weeping), and dust off the excess. Optional: Apply a skin-prep wipe (such as Skin Prep or All-Kare) to the skin  around the stoma, and let it dry. Do not apply this solution if the skin is irritated (red, tender, or broken) or if you have shaved around the stoma. Optional: Apply a skin barrier paste (such as Stomahesive, Coloplast, or Premium) around the opening cut in the back of the pouch or wafer. Allow it to dry for 30 to 60 seconds.  Hold the pouch (1-piece system) or wafer (2-piece system) with the sticky side toward your body. Make sure the skin around the stoma is wrinkle-free. Center the opening on the stoma, then press firmly to your abdomen (Fig. 4). Look in the mirror to check if you are placing the pouch, or wafer, in the right position. For a 2-piece system, snap the pouch onto the wafer. Make sure it snaps into place securely.  Place your hand over the stoma and the pouch or wafer for about 30 seconds. The heat from your hand can help the pouch or wafer stick to your skin.  Add deodorant (such as Super Banish or Nullo) to your pouch. Other options include food extracts such as vanilla oil and peppermint extract. Add about 10 drops of the deodorant to the pouch. Then apply the closure clamp. Note: Do not use toxic  chemicals or commercial cleaning agents in your pouch. These substances may harm the stoma.  Optional: For extra seal, apply tape to all 4 sides around the pouch or wafer, as if you were framing a picture. You may use any brand of medical adhesive tape. Change your pouch every 5 to 7 days. Change it immediately if a leak occurs.  Wash your hands afterwards.  If you are wearing a 2-piece system, you may use 2 new pouches per week and alternate them. Rinse the pouch with mild soap and warm water and hang it to dry for the next day. Apply the fresh pouch. Alternate the 2 pouches like this for a week. After a week, change the wafer and begin with 2 new pouches. Place the old pouches in a plastic bag, and put them in the trash.    Tips for colostomy care  Applying Your Pouch You may  stand or sit to apply your pouch.  Keep the skin where you apply the pouch wrinkle-free. If the skin around the pouch is wrinkled, the seal may break when your skin stretches.  If hair grows close to your stoma, you may trim off the hair with scissors, an electric razor, or a safety razor.  Always have a mirror nearby so you can get a better view of your stoma.  When you apply a new pouch, write the date on the adhesive tape. This will remind you of when you last changed your pouch.  Changing Your Pouch The best time to change your pouch is in the morning, before eating or drinking anything. Your stoma can function at any time, but it will function more after eating or drinking.  Emptying Your Pouch Empty your pouch when it is one-third full (of urine, stool, and/or gas). If you wait until your pouch is fuller than this, it will be more difficult to empty and more noticeable. When you empty your pouch, either put toilet paper in the toilet bowl first, or flush the toilet while you empty the pouch. This will reduce splashing. You can empty the pouch between your legs or to one side while sitting, or while standing or stooping. If you have a 2-piece system, you can snap off the pouch to empty it. Remember that your stoma may function during this time. If you wish to rinse your pouch after you empty it, a [place] baster can be helpful. When using a baster, squirt water up into the pouch through the opening at the bottom. With a 2-piece system, you can snap off the pouch to rinse it. After rinsing  your pouch, empty it into the toilet. When rinsing your pouch at home, put a few granules of Dreft soap in the rinse water. This helps lubricate and freshen your pouch. The inside of your pouch can be sprayed with non-stick cooking oil (Pam spray). This may help reduce stool sticking to the inside of the pouch.  Bathing You may shower or bathe with your pouch on or off. Remember that your stoma may  function during this time.  The materials you use to wash your stoma and the skin around it should be clean, but they do not need to be sterile.  Wearing Your Pouch During hot weather, or if you perspire a lot in general, wear a cover over your pouch. This may prevent a rash on your skin under the pouch. Pouch covers are sold at ostomy supply stores. Wear the pouch inside your underwear for better support. Watch your weight. Any gain or loss of 10 to 15 pounds or more can change the way your pouch fits.  Going Away From Home A collapsible cup (like those that come in travel kits) or a soft plastic squirt bottle with a pull-up top (like a travel bottle for shampoo) can be used for rinsing your pouch when you are away from home. Tilt the opening of the pouch at an upward angle when using a cup to rinse.  Carry wet wipes or extra tissues to use in public bathrooms.  Carry an extra pouching system with you at all times.  Never keep ostomy supplies in the glove compartment of your car. Extreme heat or cold can damage the skin barriers and adhesive wafers on the pouch.  When you travel, carry your ostomy supplies with you at all times. Keep them within easy reach. Do not pack ostomy supplies in baggage that will be checked or otherwise separated from you, because your baggage might be lost. If youre traveling out of the country, it is helpful to have a letter stating that you are carrying ostomy supplies as a medical necessity.  If you need ostomy supplies while traveling, look in the yellow pages of the telephone book under Surgical Supplies. Or call the local ostomy organization to find out where supplies are available.  Do not let your ostomy supplies get low. Always order new pouches before you use the last one.  Reducing Odor Limit foods such as broccoli, cabbage, onions, fish, and garlic in your diet to help reduce odor. Each time you empty your pouch, carefully clean the opening of the  pouch, both inside and outside, with toilet paper. Rinse your pouch 1 or 2 times daily after you empty it (see directions for emptying your pouch and going away from home). Add deodorant (such as Super Banish or Nullo) to your pouch. Use air deodorizers in your bathroom. Do not add aspirin to your pouch. Even though aspirin can help prevent odor, it could cause ulcers on your stoma.  When to call the doctor Call the doctor if you have any of the following symptoms: Purple, black, or white stoma Severe cramps lasting more than 6 hours Severe watery discharge from the stoma lasting more than 6 hours No output from the colostomy for 3 days Excessive bleeding from your stoma Swelling of your stoma to more than 1/2-inch larger than usual Pulling inward of your stoma below skin level Severe skin irritation or deep ulcers Bulging or other changes in your abdomen  When to call your ostomy nurse Call your ostomy/enterostomal therapy (ET) nurse if any of the following occurs: Frequent leaking of your pouching system Change in size or appearance of your stoma, causing discomfort or problems with your pouch Skin rash or rawness Weight gain or loss that causes problems with your pouch      FREQUENTLY ASKED QUESTIONS   Why havent you met any of these folks who have an ostomy?  Well, maybe you have! You just did not recognize them because an ostomy doesn't show. It can be kept secret if you wish. Why, maybe some of your best friends, office associates or neighbors have an ostomy ... you never can tell.   People facing ostomy surgery have many quality-of-life questions like:  Will you bulge? Smell? Make noises? Will you feel waste leaving your body? Will you be a captive of the toilet? Will you starve? Be a social outcast? Get/stay married? Have babies? Easily bathe, go swimming, bend over?  OK, lets look at what you can expect:   Will you bulge?  Remember, without part of the intestine or  bladder, and its contents, you should have a flatter tummy than before. You can expect to wear, with little exception, what you wore before surgery ... and this in-cludes tight clothing and bathing suits.   Will you smell?  Today, thanks to modern odor proof pouching systems, you can walk into an ostomy support group meeting and not smell anything that is foul or offensive. And, for those with an ileostomy or colostomy who are concerned about odor when emptying their pouch, there are in-pouch deodorants that can be used to eliminate any waste odors that may exist.   Will you make noises?  Everyone produces gas, especially if they are an air-swallower. But intestinal sounds that occur from time to time are no differ-ent than a gurgling tummy, and quite often your clothing will muffle any sounds.   Will you feel the waste discharges?  For those with a colostomy or ileostomy there might be a slight pressure when waste leaves your body, but understand that the intestines have no nerve endings, so there will be no unpleasant sensations. Those with a urostomy will probably be unaware of any kidney drainage.   Will you be a captive of  the toilet?  Immediately post-op you will spend more time in the bathroom than you will after your body recovers from surgery. Every person is different, but on average those with an ileostomy or urostomy may empty their pouches 4 to 6 times a day; a little  less if you have a colostomy. The average wear time between pouch system changes is 3 to 5 days and the changing process should take less than 30 minutes.   Will I need to be on a special diet? Most people return to their normal diet when they have recovered from surgery. Be sure to chew your food well, eat a well-balanced diet and drink plenty of fluids. If you experience problems with a certain food, wait a couple of weeks and try it again.  Will there be odor and noises? Pouching systems are designed to be odor-proof  or odor-resistant. There are deodorants that can be used in the pouch. Medications are also available to help reduce odor. Limit gas-producing foods and carbonated beverages. You will experience less gas and fewer noises as you heal from surgery.  How much time will it take to care for my ostomy? At first, you may spend a lot of time learning about your ostomy and how to take care of it. As you become more comfortable and skilled at changing the pouching system, it will take very little time to care for it.   Will I be able to return to work? People with ostomies can perform most jobs. As soon as you have healed from surgery, you should be able to return to work. Heavy lifting (more than 10 pounds) may be discouraged.   What about intimacy? Sexual relationships and intimacy are important and fulfilling aspects of your life. They should continue after ostomy surgery. Intimacy-related concerns should be discussed openly between you and your partner.   Can I wear regular clothing? You do not need to wear special clothing. Ostomy pouches are fairly flat and barely noticeable. Elastic undergarments will not hurt the stoma or prevent the ostomy from functioning.   Can I participate in sports? An ostomy should not limit your involvement in sports. Many people with ostomies are runners, skiers, swimmers or participate in other active lifestyles. Talk with your caregiver first before doing heavy physical activity.  Will you starve?  Not if you follow doctors orders at each stage of your post-op adjustment. There is no such thing as an ostomy diet. Some people with an ostomy will be able to eat and tolerate anything; others may find diffi-culty with some foods. Each person is an individual and must determine, by trial, what is best for them. A good practice for all is to drink plenty of water.   Will you be a social outcast?  Have you met anyone who has an ostomy and is a social outcast? Why should you  be the first? Only your attitude and self image will effect how you are treated. No confi-dent person is an Occupational psychologist.     PROFESSIONAL HELP  Resources are available if you need help or have questions about your ostomy.    Specially trained nurses called Wound, Ostomy Continence Nurses (WOCN) are available for consultation in most major medical centers.   Consider getting an ostomy consult with Cena Benton at [facility] to help troubleshoot stoma pouch fittings and other issues with your ostomy: [phone]   The [employer] Ostomy Association (UOA) is a group made up of many local chapters throughout the  [place]. These local groups hold meetings and provide support to prospective and existing ostomates. They sponsor educational events and have qualified visitors to make personal or telephone visits. Contact the UOA for the chapter nearest you and for other educational publications.   More detailed information can be found in Colostomy Guide, a publication of the [employer] (UOA). Contact UOA at 1-289-087-6441 or visit their web site at https://arellano.com/. The website contains links to other sites, suppliers and resources.   Tree surgeon Start Services:  Start at the website to enlist for support.  Your Wound Ostomy (WOCN) nurse may have started this process. https://www.hollister.com/en/securestart  Secure Start services are designed to support people as they live their lives with an ostomy or neurogenic bladder. Enrolling is easy and at no cost to the patient. We realize that each person's needs and life journey are different. Through Secure Start services, we want to help people live their life, their way.  SURGERY: POST OP INSTRUCTIONS (Surgery for small bowel obstruction, colon resection, etc)   ######################################################################  EAT Gradually transition to a high fiber diet with a fiber supplement over the next few  days after discharge  WALK Walk an hour a day.  Control your pain to do that.    CONTROL PAIN Control pain so that you can walk, sleep, tolerate sneezing/coughing, go up/down stairs.  HAVE A BOWEL MOVEMENT DAILY Keep your bowels regular to avoid problems.  OK to try a laxative to override constipation.  OK to use an antidairrheal to slow down diarrhea.  Call if not better after 2 tries  CALL IF YOU HAVE PROBLEMS/CONCERNS Call if you are still struggling despite following these instructions. Call if you have concerns not answered by these instructions  ######################################################################   DIET Follow a light diet the first few days at home.  Start with a bland diet such as soups, liquids, starchy foods, low fat foods, etc.  If you feel full, bloated, or constipated, stay on a ful liquid or pureed/blenderized diet for a few days until you feel better and no longer constipated. Be sure to drink plenty of fluids every day to avoid getting dehydrated (feeling dizzy, not urinating, etc.). Gradually add a fiber supplement to your diet over the next week.  Gradually get back to a regular solid diet.  Avoid fast food or heavy meals the first week as you are more likely to get nauseated. It is expected for your digestive tract to need a few months to get back to normal.  It is common for your bowel movements and stools to be irregular.  You will have occasional bloating and cramping that should eventually fade away.  Until you are eating solid food normally, off all pain medications, and back to regular activities; your bowels will not be normal. Focus on eating a low-fat, high fiber diet the rest of your life (See Getting to [place], below).  CARE of your INCISION or WOUND It is good for closed incision and even open wounds to be washed every day.  Shower every day.  Short baths are fine.  Wash the incisions and wounds clean with soap & water.    If you  have a closed incision(s), wash the incision with soap & water every day.  You may leave closed incisions open to air if it is dry.   You may cover the incision with clean gauze & replace it after your daily shower for comfort. If you have skin tapes (Steristrips)  or skin glue (Dermabond) on your incision, leave them in place.  They will fall off on their own like a scab.  You may trim any edges that curl up with clean scissors.  If you have staples, set up an appointment for them to be removed in the office in 10 days after surgery.  If you have a drain, wash around the skin exit site with soap & water and place a new dressing of gauze or band aid around the skin every day.  Keep the drain site clean & dry.    If you have an open wound with packing, see wound care instructions.  In general, it is encouraged that you remove your dressing and packing, shower with soap & water, and replace your dressing once a day.  Pack the wound with clean gauze moistened with normal (0.9%) saline to keep the wound moist & uninfected.  Pressure on the dressing for 30 minutes will stop most wound bleeding.  Eventually your body will heal & pull the open wound closed over the next few months.  Raw open wounds will occasionally bleed or secrete yellow drainage until it heals closed.  Drain sites will drain a little until the drain is removed.  Even closed incisions can have mild bleeding or drainage the first few days until the skin edges scab over & seal.   If you have an open wound with a wound vac, see wound vac care instructions.     ACTIVITIES as tolerated Start light daily activities --- self-care, walking, climbing stairs-- beginning the day after surgery.  Gradually increase activities as tolerated.  Control your pain to be active.  Stop when you are tired.  Ideally, walk several times a day, eventually an hour a day.   Most people are back to most day-to-day activities in a few weeks.  It takes 4-8 weeks to get  back to unrestricted, intense activity. If you can walk 30 minutes without difficulty, it is safe to try more intense activity such as jogging, treadmill, bicycling, low-impact aerobics, swimming, etc. Save the most intensive and strenuous activity for last (Usually 4-8 weeks after surgery) such as sit-ups, heavy lifting, contact sports, etc.  Refrain from any intense heavy lifting or straining until you are off narcotics for pain control.  You will have off days, but things should improve week-by-week. DO NOT PUSH THROUGH PAIN.  Let pain be your guide: If it hurts to do something, don't do it.  Pain is your body warning you to avoid that activity for another week until the pain goes down. You may drive when you are no longer taking narcotic prescription pain medication, you can comfortably wear a seatbelt, and you can safely make sudden turns/stops to protect yourself without hesitating due to pain. You may have sexual intercourse when it is comfortable. If it hurts to do something, stop.  MEDICATIONS Take your usually prescribed home medications unless otherwise directed.   Blood thinners:  Usually you can restart any strong blood thinners after the second postoperative day.  It is OK to take aspirin right away.     If you are on strong blood thinners (warfarin/Coumadin, Plavix, Xerelto, Eliquis, Pradaxa, etc), discuss with your surgeon, medicine PCP, and/or cardiologist for instructions on when to restart the blood thinner & if blood monitoring is needed (PT/INR blood check, etc).     PAIN CONTROL Pain after surgery or related to activity is often due to strain/injury to muscle, tendon, nerves and/or incisions.  This pain is usually short-term and will improve in a few months.  To help speed the process of healing and to get back to regular activity more quickly, DO THE FOLLOWING THINGS TOGETHER: 1. Increase activity gradually.  DO NOT PUSH THROUGH PAIN 2. Use Ice and/or Heat 3. Try Gentle  Massage and/or Stretching 4. Take over the counter pain medication 5. Take Narcotic prescription pain medication for more severe pain  Good pain control = faster recovery.  It is better to take more medicine to be more active than to stay in bed all day to avoid medications. 1.  Increase activity gradually Avoid heavy lifting at first, then increase to lifting as tolerated over the next 6 weeks. Do not push through the pain.  Listen to your body and avoid positions and maneuvers than reproduce the pain.  Wait a few days before trying something more intense Walking an hour a day is encouraged to help your body recover faster and more safely.  Start slowly and stop when getting sore.  If you can walk 30 minutes without stopping or pain, you can try more intense activity (running, jogging, aerobics, cycling, swimming, treadmill, sex, sports, weightlifting, etc.) Remember: If it hurts to do it, then dont do it! 2. Use Ice and/or Heat You will have swelling and bruising around the incisions.  This will take several weeks to resolve. Ice packs or heating pads (6-8 times a day, 30-60 minutes at a time) will help sooth soreness & bruising. Some people prefer to use ice alone, heat alone, or alternate between ice & heat.  Experiment and see what works best for you.  Consider trying ice for the first few days to help decrease swelling and bruising; then, switch to heat to help relax sore spots and speed recovery. Shower every day.  Short baths are fine.  It feels good!  Keep the incisions and wounds clean with soap & water.   3. Try Gentle Massage and/or Stretching Massage at the area of pain many times a day Stop if you feel pain - do not overdo it 4. Take over the counter pain medication This helps the muscle and nerve tissues become less irritable and calm down faster Choose ONE of the following over-the-counter anti-inflammatory medications: Acetaminophen '500mg'$  tabs (Tylenol) 1-2 pills with every  meal and just before bedtime (avoid if you have liver problems or if you have acetaminophen in you narcotic prescription) Naproxen '220mg'$  tabs (ex. Aleve, Naprosyn) 1-2 pills twice a day (avoid if you have kidney, stomach, IBD, or bleeding problems) Ibuprofen '200mg'$  tabs (ex. Advil, Motrin) 3-4 pills with every meal and just before bedtime (avoid if you have kidney, stomach, IBD, or bleeding problems) Take with food/snack several times a day as directed for at least 2 weeks to help keep pain / soreness down & more manageable. 5. Take Narcotic prescription pain medication for more severe pain A prescription for strong pain control is often given to you upon discharge (for example: oxycodone/Percocet, hydrocodone/Norco/Vicodin, or tramadol/Ultram) Take your pain medication as prescribed. Be mindful that most narcotic prescriptions contain Tylenol (acetaminophen) as well - avoid taking too much Tylenol. If you are having problems/concerns with the prescription medicine (does not control pain, nausea, vomiting, rash, itching, etc.), please call us [phone] to see if we need to switch you to a different pain medicine that will work better for you and/or control your side effects better. If you need a refill on your pain medication, you must call the  office before 4 pm and on weekdays only.  By federal law, prescriptions for narcotics cannot be called into a pharmacy.  They must be filled out on paper & picked up from our office by the patient or authorized caretaker.  Prescriptions cannot be filled after 4 pm nor on weekends.    WHEN TO CALL us [phone] Severe uncontrolled or worsening pain  Fever over 101 F (38.5 C) Concerns with the incision: Worsening pain, redness, rash/hives, swelling, bleeding, or drainage Reactions / problems with new medications (itching, rash, hives, nausea, etc.) Nausea and/or vomiting Difficulty urinating Difficulty breathing Worsening fatigue, dizziness,  lightheadedness, blurred vision Other concerns If you are not getting better after two weeks or are noticing you are getting worse, contact our office [phone] for further advice.  We may need to adjust your medications, re-evaluate you in the office, send you to the emergency room, or see what other things we can do to help. The clinic staff is available to answer your questions during regular business hours (8:30am-5pm).  Please dont hesitate to call and ask to speak to one of our nurses for clinical concerns.    A surgeon from [facility] is always on call at the hospitals 24 hours/day If you have a medical emergency, go to the nearest emergency room or call 911.  FOLLOW UP in our office One the day of your discharge from the hospital (or the next business weekday), please call [facility] to set up or confirm an appointment to see your surgeon in the office for a follow-up appointment.  Usually it is 2-3 weeks after your surgery.   If you have skin staples at your incision(s), let the office know so we can set up a time in the office for the nurse to remove them (usually around 10 days after surgery). Make sure that you call for appointments the day of discharge (or the next business weekday) from the hospital to ensure a convenient appointment time. IF YOU HAVE DISABILITY OR FAMILY LEAVE FORMS, BRING THEM TO THE OFFICE FOR PROCESSING.  DO NOT GIVE THEM TO YOUR DOCTOR.  [facility] [address], [place], [place], [place]  [address] ? [phone] - Main [phone] - [place],  [phone] - Fax [email or web address]  GETTING TO GOOD BOWEL HEALTH. It is expected for your digestive tract to need a few months to get back to normal.  It is common for your bowel movements and stools to be irregular.  You will have occasional bloating and cramping that should eventually fade away.  Until you are eating solid food normally,  off all pain medications, and back to regular activities; your bowels will not be normal.   Avoiding constipation The goal: ONE SOFT BOWEL MOVEMENT A DAY!    Drink plenty of fluids.  Choose water first. TAKE A FIBER SUPPLEMENT EVERY DAY THE REST OF YOUR LIFE During your first week back home, gradually add back a fiber supplement every day Experiment which form you can tolerate.   There are many forms such as powders, tablets, wafers, gummies, etc Psyllium bran (Metamucil), methylcellulose (Citrucel), Miralax or Glycolax, Benefiber, Flax Seed.  Adjust the dose week-by-week (1/2 dose/day to 6 doses a day) until you are moving your bowels 1-2 times a day.  Cut back the dose or try a different fiber product if it is giving you problems such as diarrhea or bloating. Sometimes a laxative is needed to help jump-start bowels  if constipated until the fiber supplement can help regulate your bowels.  If you are tolerating eating & you are farting, it is okay to try a gentle laxative such as double dose MiraLax, prune juice, or Milk of Magnesia.  Avoid using laxatives too often. Stool softeners can sometimes help counteract the constipating effects of narcotic pain medicines.  It can also cause diarrhea, so avoid using for too long. If you are still constipated despite taking fiber daily, eating solids, and a few doses of laxatives, call our office. Controlling diarrhea Try drinking liquids and eating bland foods for a few days to avoid stressing your intestines further. Avoid dairy products (especially milk & ice cream) for a short time.  The intestines often can lose the ability to digest lactose when stressed. Avoid foods that cause gassiness or bloating.  Typical foods include beans and other legumes, cabbage, broccoli, and dairy foods.  Avoid greasy, spicy, fast foods.  Every person has some sensitivity to other foods, so listen to your body and avoid those foods that trigger problems for you. Probiotics  (such as active yogurt, Align, etc) may help repopulate the intestines and colon with normal bacteria and calm down a sensitive digestive tract Adding a fiber supplement gradually can help thicken stools by absorbing excess fluid and retrain the intestines to act more normally.  Slowly increase the dose over a few weeks.  Too much fiber too soon can backfire and cause cramping & bloating. It is okay to try and slow down diarrhea with a few doses of antidiarrheal medicines.   Bismuth subsalicylate (ex. Kayopectate, Pepto Bismol) for a few doses can help control diarrhea.  Avoid if pregnant.   Loperamide (Imodium) can slow down diarrhea.  Start with one tablet ('2mg'$ ) first.  Avoid if you are having fevers or severe pain.  ILEOSTOMY PATIENTS WILL HAVE CHRONIC DIARRHEA since their colon is not in use.    Drink plenty of liquids.  You will need to drink even more glasses of water/liquid a day to avoid getting dehydrated. Record output from your ileostomy.  Expect to empty the bag every 3-4 hours at first.  Most people with a permanent ileostomy empty their bag 4-6 times at the least.   Use antidiarrheal medicine (especially Imodium) several times a day to avoid getting dehydrated.  Start with a dose at bedtime & breakfast.  Adjust up or down as needed.  Increase antidiarrheal medications as directed to avoid emptying the bag more than 8 times a day (every 3 hours). Work with your wound ostomy nurse to learn care for your ostomy.  See ostomy care instructions. TROUBLESHOOTING IRREGULAR BOWELS 1) Start with a soft & bland diet. No spicy, greasy, or fried foods.  2) Avoid gluten/wheat or dairy products from diet to see if symptoms improve. 3) Miralax 17gm or flax seed mixed in [place]. water or juice-daily. May use 2-4 times a day as needed. 4) Gas-X, Phazyme, etc. as needed for gas & bloating.  5) Prilosec (omeprazole) over-the-counter as needed 6)  Consider probiotics (Align, Activa, etc) to help calm the  bowels down  Call your doctor if you are getting worse or not getting better.  Sometimes further testing (cultures, endoscopy, X-ray studies, CT scans, bloodwork, etc.) may be needed to help diagnose and treat the cause of the diarrhea. [facility], [place], [place], [place], [place]  [address] [phone] - Main.    [phone]  - Toll Free.   [phone] -  Fax [email or web address]

## 2016-12-02 NOTE — Progress Notes (Signed)
PICC LINE ORDER QUESTIONED DESPITE INDICATION NOTED IN ORDER, PROGRESS NOTE, [place]  PICC team asked floor RN to call me.  PATIENT NEEDS A PICC FOR HOME IVF THERAPIES PER ILEOSTOMY PROTOCOL  [name], M.D., F.A.C.S. Gastrointestinal and Minimally Invasive Surgery [facility] [address] [place], [place] [address] [phone] Main / Paging

## 2016-12-02 NOTE — Progress Notes (Signed)
Spoke with patient at bedside with daughter present. Discussed need for [facility] RN for ostomy care and possible IVF. Patient agrees to [facility] services, chose [facility]. Contacted [facility] for referral, they will follow for d/c needs.

## 2016-12-02 NOTE — Plan of Care (Signed)
Problem: Food- and Nutrition-Related Knowledge Deficit (NB-1.1) Goal: Nutrition education Formal process to instruct or train a patient/client in a skill or to impart knowledge to help patients/clients voluntarily manage or modify food choices and eating behavior to maintain or improve health. Outcome: Completed/Met Date Met: 12/02/16 Nutrition Education Note  RD consulted for nutrition education regarding a diet status post ileostomy.  RD provided "Ileostomy Nutrition Therapy" handout from the Academy of [employer] and Dietetics. Encouraged patient to keep fiber intake below 8 grams during transition from liquids to solids, and below 13 grams per day as symptoms subside. Encouraged patient to consume refined and white grain foods with less than 2g of fiber per serving as well as soft, well-cooked proteins, and well cooked vegetables without seeds or skin.  RD discussed why it is important to adhere to list of recommended foods, and foods to avoid, to maintain ostomy integrity and decrease risk of gas, diarrhea, and blockage related complications. Encouraged patient to consume 8 to 10 cups of liquid per day and to drink liquids 30 minutes after meals or snacks to prevent flushing.   Provided tips to ensure adequate absorption of medications, vitamins, and minerals. After 6 weeks, as patient's diet returns to normal, encouraged adding 1 new food in per day, for a few days to monitor tolerance.  Expect fair compliance with adequate support.  Body mass index is 21.66 kg/m. Pt meets criteria for normal range based on current BMI.   Current diet order is Heart Healthy.  Labs and medications reviewed. No further nutrition interventions warranted at this time. If additional nutrition issues arise, please re-consult RD  Clayton Bibles, MS, RD, LDN Pager: [phone] After Hours Pager: [phone]

## 2016-12-02 NOTE — Progress Notes (Addendum)
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  Scar 3-4cm from the anal verge.  No obvious metastatic disease on visceral parietal peritoneum or liver.  He has a ileal J-pouch.  15 cm long.  25 EEA stapled to the anal canal.  Ring at just above the sphincters.  Small anterior leak.  The anastomosis rests 0 cm from the anal verge by rigid proctoscopy.  Diverting loop ileostomy.  Assessment  Recovering  Plan:  Adv diet per ERAS pathway Ileostomy care/training Diversion, omental pedicle & drain for small IPAA leak Low Mag - correct -VTE prophylaxis- SCDs, etc -PICC line for 6 week home IVF per ileostomy protocol -mobilize as tolerated to help recovery  STOP SMOKING! We talked to the patient about the dangers of smoking.  We stressed that tobacco use dramatically increases the risk of peri-operative complications such as infection, tissue necrosis leaving to problems with incision/wound and organ healing, hernia, chronic pain, heart attack, stroke, DVT, pulmonary embolism, and death.  We noted there are programs in our community to help stop  smoking.  Information was available.   Adin Hector, M.D., F.A.C.S. Gastrointestinal and Minimally Invasive Surgery [facility] [address] [place], [place] [address] [phone] Main / Paging   12/02/2016  CARE TEAM:  PCP: [name]  Outpatient Care Team: Patient Care Team: [name] as PCP - General ([place]) Tania Ade, RN as Registered Nurse Michael Boston, MD as Consulting Physician (General Surgery) Doran Stabler, MD as Consulting Physician (Gastroenterology) Kyung Rudd, MD as Consulting Physician (Radiation Oncology) Truitt Merle, MD as Consulting Physician (Oncology)  Inpatient Treatment Team: Treatment Team: Attending Provider: Michael Boston, MD; Registered Nurse: Mertha Baars, RN; Technician: Resa Miner Spaugh, NT  Subjective:  Pain minimal tol liquids - on fulls this AM Wife at bedside  Objective:  Vital signs:  Vitals:   12/01/16 1942 12/01/16 2028 12/02/16 0113 12/02/16 0530  BP: 123/78 (!) 150/75 125/63 (!) 115/56  Pulse: 98 67 95 73  Resp: _0 Temp: 98.2 F (36.8 C) 98.3 F (36.8 C) 98.4 F (36.9 C) 98.4 F (36.9 C)  TempSrc: Oral Oral Oral Oral  SpO2: 98% 100% 97% 97%  Weight:      Height:        Last BM Date: 12/01/16  Intake/Output   Yesterday:  12/13 0701 - 12/14 0700 In: 5570 [P.O.:600; I.V.:4720; IV Piggyback:250] Out: 8502 [Urine:3200; Drains:990; Stool:70; Blood:200] This shift:  No intake/output data recorded.  Bowel function:  Flatus: YES  BM:  YES  Drain: Serosanguinous   Physical Exam:  General: Pt awake/alert/oriented x4 in  No acute distress Eyes: PERRL, normal EOM.  Sclera clear.  No icterus Neuro: CN II-XII intact w/o focal sensory/motor deficits. Lymph: No head/neck/groin lymphadenopathy Psych:  No delerium/psychosis/paranoia HENT: Normocephalic, Mucus membranes moist.  No thrush Neck: Supple, No tracheal  deviation Chest: No chest wall pain w good excursion CV:  Pulses intact.  Regular rhythm MS: Normal AROM mjr joints.  No obvious deformity Abdomen: Soft.  Nondistended.  Mildly tender at incisions only.  No evidence of peritonitis.  No incarcerated hernias. Ext:  SCDs BLE.  No mjr edema.  No cyanosis Skin: No petechiae / purpura  Results:   Labs: Results for orders placed or performed during the hospital encounter of 12/01/16 (from the past 48 hour(s))  Basic metabolic panel     Status: Abnormal   Collection Time: 12/02/16  4:38 AM  Result Value Ref Range   Sodium 137 135 - 145 mmol/L   Potassium 4.0 3.5 - 5.1 mmol/L   Chloride 106 101 - 111 mmol/L   CO2 24 22 - 32 mmol/L   Glucose, Bld 151 (H) 65 - 99 mg/dL   BUN 8 6 - 20 mg/dL   Creatinine, Ser 1.09 0.61 - 1.24 mg/dL   Calcium 8.0 (L) 8.9 - 10.3 mg/dL   GFR calc non Af Amer >60 >60 mL/min   GFR calc Af Amer >60 >60 mL/min    Comment: (NOTE) The eGFR has been calculated using the CKD EPI equation. This calculation has not been validated in all clinical situations. eGFR's persistently <60 mL/min signify possible Chronic Kidney Disease.    Anion gap 7 5 - 15  CBC     Status: Abnormal   Collection Time: 12/02/16  4:38 AM  Result Value Ref Range   WBC 13.9 (H) 4.0 - 10.5 K/uL   RBC 4.07 (L) 4.22 - 5.81 MIL/uL   Hemoglobin 12.7 (L) 13.0 - 17.0 g/dL   HCT 37.2 (L) 39.0 - 52.0 %   MCV 91.4 78.0 - 100.0 fL   MCH 31.2 26.0 - 34.0 pg   MCHC 34.1 30.0 - 36.0 g/dL   RDW 14.7 11.5 - 15.5 %   Platelets 260 150 - 400 K/uL  Magnesium     Status: Abnormal   Collection Time: 12/02/16  4:38 AM  Result Value Ref Range   Magnesium 1.6 (L) 1.7 - 2.4 mg/dL    Imaging / Studies: No results found.  Medications / Allergies: per chart  Antibiotics: Anti-infectives    Start     Dose/Rate Route Frequency Ordered Stop   12/02/16 0200  cefoTEtan (CEFOTAN) 2 g in dextrose 5 % 50 mL IVPB     2 g 100 mL/hr over 30 Minutes Intravenous Every  12 hours 12/01/16 1730 12/02/16 0207   12/01/16 1512  clindamycin (CLEOCIN) 900 mg, gentamicin (GARAMYCIN) 240 mg in sodium chloride 0.9 % 1,000 mL for intraperitoneal lavage  Status:  Discontinued       As needed 12/01/16 1513 12/01/16 1549   12/01/16 0600  clindamycin (CLEOCIN) 900 mg, gentamicin (GARAMYCIN) 240 mg in sodium chloride 0.9 % 1,000 mL for intraperitoneal lavage  Status:  Discontinued      Intraperitoneal To Surgery 11/30/16 1330 12/01/16 1709   12/01/16 0553  cefoTEtan (CEFOTAN) 2 g in dextrose 5 % 50 mL IVPB     2 g 100 mL/hr over 30 Minutes Intravenous On call to O.R. 12/01/16 0553 12/01/16 1410        Note: Portions of this report may have been  transcribed using voice recognition software. Every effort was made to ensure accuracy; however, inadvertent computerized transcription errors may be present.   Any transcriptional errors that result from this process are unintentional.     Adin Hector, M.D., F.A.C.S. Gastrointestinal and Minimally Invasive Surgery [facility] [address], [place], [place] [address] [phone] Main / Paging   12/02/2016

## 2016-12-03 LAB — CREATININE, SERUM
CREATININE: 0.89 mg/dL (ref 0.61–1.24)
GFR calc Af Amer: >60 mL/min (ref >60)
GFR calc non Af Amer: >60 mL/min (ref >60)

## 2016-12-03 LAB — HEMOGLOBIN: Hemoglobin: 11.1 g/dL — ABNORMAL LOW (ref 13.0–17.0)

## 2016-12-03 LAB — MAGNESIUM: Magnesium: 1.9 mg/dL (ref 1.7–2.4)

## 2016-12-03 LAB — POTASSIUM: POTASSIUM: 3.8 mmol/L (ref 3.5–5.1)

## 2016-12-03 MED ORDER — TRAMADOL HCL 50 MG PO TABS
50.0000 mg | ORAL_TABLET | Freq: Four times a day (QID) | ORAL | Status: DC | PRN
Start: 2016-12-03 — End: 2016-12-05
  Administered 2016-12-04 – 2016-12-05 (×3): 100 mg via ORAL
  Filled 2016-12-03 (×3): qty 2

## 2016-12-03 MED ORDER — LACTATED RINGERS IV BOLUS (SEPSIS)
1000.0000 mL | Freq: Once | INTRAVENOUS | Status: AC
  Administered 2016-12-03: 1000 mL via INTRAVENOUS

## 2016-12-03 MED ORDER — POLYETHYLENE GLYCOL 3350 17 G PO PACK
8.5000 g | PACK | Freq: Every day | ORAL | Status: DC
  Administered 2016-12-03: 8.5 g via ORAL
  Filled 2016-12-03 (×3): qty 1

## 2016-12-03 MED ORDER — METHOCARBAMOL 500 MG PO TABS: 1000.0000 mg | ORAL_TABLET | Freq: Four times a day (QID) | ORAL | Status: DC | PRN

## 2016-12-03 MED ORDER — FERROUS SULFATE 325 (65 FE) MG PO TABS
325.0000 mg | ORAL_TABLET | Freq: Two times a day (BID) | ORAL | Status: DC
  Administered 2016-12-03 – 2016-12-05 (×5): 325 mg via ORAL
  Filled 2016-12-03 (×5): qty 1

## 2016-12-03 MED ORDER — LACTATED RINGERS IV BOLUS (SEPSIS): 1000.0000 mL | Freq: Three times a day (TID) | INTRAVENOUS | Status: AC | PRN

## 2016-12-03 MED ORDER — LOPERAMIDE HCL 2 MG PO CAPS
2.0000 mg | ORAL_CAPSULE | Freq: Every day | ORAL | Status: DC
  Administered 2016-12-03 – 2016-12-05 (×3): 2 mg via ORAL
  Filled 2016-12-03 (×3): qty 1

## 2016-12-03 MED ORDER — LOPERAMIDE HCL 2 MG PO CAPS
2.0000 mg | ORAL_CAPSULE | Freq: Every day | ORAL | Status: DC
  Administered 2016-12-03 – 2016-12-04 (×2): 2 mg via ORAL
  Filled 2016-12-03 (×2): qty 1

## 2016-12-03 MED ORDER — LOPERAMIDE HCL 2 MG PO CAPS: 2.0000 mg | ORAL_CAPSULE | Freq: Three times a day (TID) | ORAL | Status: DC | PRN

## 2016-12-03 NOTE — Progress Notes (Signed)
[name] had large amount of bright red blood from his rectum at @ 315 this afternoon 12/15. I paged Dr. Johney Maine to inform him, waiting on response.

## 2016-12-03 NOTE — Progress Notes (Signed)
Dr. Johney Maine called and per order RN is to hold Lovenox for 48hrs and have Pt apply ice on rectal area.

## 2016-12-03 NOTE — Progress Notes (Signed)
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  Scar 3-4cm from the anal verge.  No obvious metastatic disease on visceral parietal peritoneum or liver.  He has a ileal J-pouch.  15 cm long.  25 EEA stapled to the anal canal.  Ring at just above the sphincters.  Small anterior leak.  The anastomosis rests 0 cm from the anal verge by rigid proctoscopy.  Diverting loop ileostomy.  Assessment  Recovering  Plan:  -f/u Pathology -Solid diet  -Ileostomy care/training -Slow down ileostomy diarrhea - iron & imodium -Follow off IVFs -Set up Home health / outpt IVF infusions 2L crystalloid over 4 hours qMWF x 6 weeks to avoid readmission/ARF/etc -Diversion, omental pedicle & drain for small IPAA leak.  Keep drain until ~2 weeks - will d/c in clinic -Electrolytes OK - follow -VTE prophylaxis- SCDs, etc -PICC line for 6 week home IVF per ileostomy protocol -mobilize as tolerated to help recovery  STOP SMOKING! We talked to the patient about the dangers of smoking.  We stressed that tobacco use dramatically increases  the risk of peri-operative complications such as infection, tissue necrosis leaving to problems with incision/wound and organ healing, hernia, chronic pain, heart attack, stroke, DVT, pulmonary embolism, and death.  We noted there are programs in our community to help stop smoking.  Information was available.   Adin Hector, M.D., F.A.C.S. Gastrointestinal and Minimally Invasive Surgery [facility] [address] [place], [place] [address] [phone] Main / Paging   12/03/2016  CARE TEAM:  PCP: [name]  Outpatient Care Team: Patient Care Team: [name] as PCP - General ([facility]) Tania Ade, RN as Registered Nurse Michael Boston, MD as Consulting Physician (General Surgery) Doran Stabler, MD as Consulting Physician (Gastroenterology) Kyung Rudd, MD as Consulting Physician (Radiation Oncology) Truitt Merle, MD as Consulting Physician (Oncology)  Inpatient Treatment Team: Treatment Team: Attending Provider: Michael Boston, MD; Registered Nurse: Mertha Baars, RN; Technician: Tenna Child, NT; Registered Nurse: Bailey Mech, RN  Subjective:  Pain minimal Starting solids PO RN at bedside Walking at bedside  Objective:  Vital signs:  Vitals:   12/02/16 1300 12/02/16 2035 12/03/16 0506 12/03/16 0601  BP: 129/80 116/62 (!) 102/55   Pulse: (!) 52 68 98   Resp: _0 Temp: 98.2 F (36.8 C) 97.8 F (36.6 C) 98 F (36.7 C)   TempSrc: Oral Oral Oral   SpO2: 99% 100% 100%   Weight:    74.4 kg (164 lb)  Height:        Last BM Date:  12/03/16  Intake/Output   Yesterday:  12/14 0701 - 12/15 0700 In: 1080 [P.O.:600; I.V.:480] Out: 5270 [Urine:4285; Drains:485; Stool:500] This shift:  No intake/output data recorded.  Bowel function:  Flatus: YES  BM:  YES - mildly thickened dark succus  Drain: Serosanguinous, but more serous   Physical Exam:  General: Pt  awake/alert/oriented x4 in No acute distress Eyes: PERRL, normal EOM.  Sclera clear.  No icterus Neuro: CN II-XII intact w/o focal sensory/motor deficits. Lymph: No head/neck/groin lymphadenopathy Psych:  No delerium/psychosis/paranoia HENT: Normocephalic, Mucus membranes moist.  No thrush Neck: Supple, No tracheal deviation Chest: No chest wall pain w good excursion CV:  Pulses intact.  Regular rhythm MS: Normal AROM mjr joints.  No obvious deformity Abdomen: Soft.  Nondistended.  Mildly tender at incisions only.  No evidence of peritonitis.  No incarcerated hernias. Ext:  SCDs BLE.  No mjr edema.  No cyanosis Skin: No petechiae / purpura  Results:   Labs: Results for orders placed or performed during the hospital encounter of 12/01/16 (from the past 48 hour(s))  Basic metabolic panel     Status: Abnormal   Collection Time: 12/02/16  4:38 AM  Result Value Ref Range   Sodium 137 135 - 145 mmol/L   Potassium 4.0 3.5 - 5.1 mmol/L   Chloride 106 101 - 111 mmol/L   CO2 24 22 - 32 mmol/L   Glucose, Bld 151 (H) 65 - 99 mg/dL   BUN 8 6 - 20 mg/dL   Creatinine, Ser 1.09 0.61 - 1.24 mg/dL   Calcium 8.0 (L) 8.9 - 10.3 mg/dL   GFR calc non Af Amer >60 >60 mL/min   GFR calc Af Amer >60 >60 mL/min    Comment: (NOTE) The eGFR has been calculated using the CKD EPI equation. This calculation has not been validated in all clinical situations. eGFR's persistently <60 mL/min signify possible Chronic Kidney Disease.    Anion gap 7 5 - 15  CBC     Status: Abnormal   Collection Time: 12/02/16  4:38 AM  Result Value Ref Range   WBC 13.9 (H) 4.0 - 10.5 K/uL   RBC 4.07 (L) 4.22 - 5.81 MIL/uL   Hemoglobin 12.7 (L) 13.0 - 17.0 g/dL   HCT 37.2 (L) 39.0 - 52.0 %   MCV 91.4 78.0 - 100.0 fL   MCH 31.2 26.0 - 34.0 pg   MCHC 34.1 30.0 - 36.0 g/dL   RDW 14.7 11.5 - 15.5 %   Platelets 260 150 - 400 K/uL  Magnesium     Status: Abnormal   Collection Time: 12/02/16  4:38 AM  Result Value Ref Range    Magnesium 1.6 (L) 1.7 - 2.4 mg/dL  Magnesium     Status: None   Collection Time: 12/03/16  4:55 AM  Result Value Ref Range   Magnesium 1.9 1.7 - 2.4 mg/dL  Potassium     Status: None   Collection Time: 12/03/16  4:55 AM  Result Value Ref Range   Potassium 3.8 3.5 - 5.1 mmol/L  Creatinine, serum     Status: None   Collection Time: 12/03/16  4:55 AM  Result Value Ref Range   Creatinine, Ser 0.89 0.61 - 1.24 mg/dL   GFR calc non Af Amer >60 >60 mL/min   GFR calc Af Amer >60 >60 mL/min    Comment: (NOTE) The eGFR has been calculated using the CKD EPI equation. This calculation has not been validated in all clinical situations. eGFR's persistently <60  mL/min signify possible Chronic Kidney Disease.   Hemoglobin     Status: Abnormal   Collection Time: 12/03/16  4:55 AM  Result Value Ref Range   Hemoglobin 11.1 (L) 13.0 - 17.0 g/dL    Imaging / Studies: No results found.  Medications / Allergies: per chart  Antibiotics: Anti-infectives    Start     Dose/Rate Route Frequency Ordered Stop   12/02/16 0200  cefoTEtan (CEFOTAN) 2 g in dextrose 5 % 50 mL IVPB     2 g 100 mL/hr over 30 Minutes Intravenous Every 12 hours 12/01/16 1730 12/02/16 0207   12/01/16 1512  clindamycin (CLEOCIN) 900 mg, gentamicin (GARAMYCIN) 240 mg in sodium chloride 0.9 % 1,000 mL for intraperitoneal lavage  Status:  Discontinued       As needed 12/01/16 1513 12/01/16 1549   12/01/16 0600  clindamycin (CLEOCIN) 900 mg, gentamicin (GARAMYCIN) 240 mg in sodium chloride 0.9 % 1,000 mL for intraperitoneal lavage  Status:  Discontinued      Intraperitoneal To Surgery 11/30/16 1330 12/01/16 1709   12/01/16 0553  cefoTEtan (CEFOTAN) 2 g in dextrose 5 % 50 mL IVPB     2 g 100 mL/hr over 30 Minutes Intravenous On call to O.R. 12/01/16 0553 12/01/16 1410        Note: Portions of this report may have been transcribed using voice recognition software. Every effort was made to ensure accuracy; however, inadvertent  computerized transcription errors may be present.   Any transcriptional errors that result from this process are unintentional.     Adin Hector, M.D., F.A.C.S. Gastrointestinal and Minimally Invasive Surgery [facility] [address], [place], [place] [address] [phone] Main / Paging   12/03/2016

## 2016-12-03 NOTE — Consult Note (Addendum)
[place] Nurse ostomy follow up Stoma type/location: RUQ ileostomy Stomal assessment/size: 1 and 3/8 inch x 1 and 3/4 inch, red, moist, budded.  Visualized through intact pouch applied yesterday. Peristomal assessment: Not seen today Treatment options for stomal/peristomal skin: Skin barrier ring placed yesterday Output: Dark green liquid effluent Ostomy pouching: 2pc. 2 and 1/4 inch pouching system.  Supplies placed in room for 7 set ups, powder, a belt, deodorant spray.   Education provided: Patient is working with Nursing Tech today on emptying pouch when 1/3 to 1/2 full, using toilet paper "wicks" to clean bottom two inches of tail closure on pouch.  Today, I have asked patient to work toward independence today with this skill as he will need to be independent in emptying prior to discharge.  Same communicated to Nursing Domingo Cocking.  Patient would benefit from [facility] for reinforcement of education, instruction and practice with pouch change/application, continued measuring of ostomy as it shrinks to protect skin and assistance incorporating having an ileostomy into his ADLs for a few weeks post discharge from acute care. Enrolled patient in [place] Start Discharge program: Yes. Since pouching system is intact, I will register patient with [facility] today for a 2-piece system and skin barrier ring. [facility] nursing team will follow if still here on Monday, and will remain available to this patient, the nursing, surgical and medical teams. Patient has my contact information for questions following discharge. Thanks, Maudie Flakes, MSN, RN, [place], Arther Abbott  Pager# [phone]

## 2016-12-04 LAB — POTASSIUM: POTASSIUM: 3.5 mmol/L (ref 3.5–5.1)

## 2016-12-04 LAB — CREATININE, SERUM
Creatinine, Ser: 0.72 mg/dL (ref 0.61–1.24)
GFR calc Af Amer: >60 mL/min (ref >60)
GFR calc non Af Amer: >60 mL/min (ref >60)

## 2016-12-04 LAB — MAGNESIUM: Magnesium: 1.7 mg/dL (ref 1.7–2.4)

## 2016-12-04 LAB — HEMOGLOBIN: Hemoglobin: 11.2 g/dL — ABNORMAL LOW (ref 13.0–17.0)

## 2016-12-04 MED ORDER — MAGNESIUM SULFATE 2 GM/50ML IV SOLN
2.0000 g | Freq: Once | INTRAVENOUS | Status: AC
  Administered 2016-12-04: 2 g via INTRAVENOUS
  Filled 2016-12-04: qty 50

## 2016-12-04 MED ORDER — POTASSIUM CHLORIDE CRYS ER 20 MEQ PO TBCR
40.0000 meq | EXTENDED_RELEASE_TABLET | Freq: Every day | ORAL | Status: DC
  Administered 2016-12-04 – 2016-12-05 (×2): 40 meq via ORAL
  Filled 2016-12-04 (×2): qty 2

## 2016-12-04 MED ORDER — ENOXAPARIN SODIUM 40 MG/0.4ML ~~LOC~~ SOLN: 40.0000 mg | SUBCUTANEOUS | Status: DC

## 2016-12-04 NOTE — Progress Notes (Signed)
[facility]  [address] 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  Scar 3-4cm from the anal verge.  Tyrone obvious metastatic disease on visceral parietal peritoneum or liver.  He has a ileal J-pouch.  15 cm long.  25 EEA stapled to the anal canal.  Ring at just above the sphincters.  Small anterior leak.  The anastomosis rests 0 cm from the anal verge by rigid proctoscopy.  Diverting loop ileostomy.  Assessment  Recovering  Plan:  -Tyrone more rectal bleeding - follow off Lovenox -f/u Pathology -Solid diet  -Ileostomy care/training -Slow down ileostomy diarrhea - iron & imodium -Follow off IVFs -low K - replace -Set up Home health / outpt IVF infusions 2L crystalloid over 4 hours qMWF x 6 weeks to avoid readmission/ARF/etc -Diversion, omental pedicle & drain for small IPAA leak.  Keep drain until ~2 weeks - will d/c in clinic -Electrolytes OK - follow -VTE prophylaxis- SCDs, etc -PICC line for 6 week home IVF per ileostomy protocol -mobilize as tolerated to help recovery  STOP SMOKING! We talked to the [name] about the dangers of  smoking.  We stressed that tobacco use dramatically increases the risk of peri-operative complications such as infection, tissue necrosis leaving to problems with incision/wound and organ healing, hernia, chronic pain, heart attack, stroke, DVT, pulmonary embolism, and death.  We noted there are programs in our community to help stop smoking.  Information was available.  D/C [name] from hospital when [name] meets criteria (anticipate in 1-2 day(s)):  Tolerating oral intake well Ambulating well Adequate pain control without IV medications Urinating  Having flatus & ileostomy output controlled Disposition planning in place  I updated the [name]'s status to the [name] and family.  Recommendations were made.  Questions were answered.  They expressed understanding & appreciation.   Ardeth Sportsman, M.D., F.A.C.S. Gastrointestinal and Minimally Invasive Surgery [facility] [address], [address] [place], [place] [address] [phone] Main / Paging   12/04/2016  CARE TEAM:  PCP: [name]  Outpatient Care Team: [name] Care Team: [name] as PCP - General (General Practice) Wandalee Ferdinand, RN as Registered Nurse Karie Soda, MD as Consulting Physician (General Surgery) Sherrilyn Rist, MD as Consulting Physician (Gastroenterology) Dorothy Puffer, MD as Consulting Physician (Radiation Oncology) Malachy Mood, MD as Consulting Physician (Oncology)  Inpatient Treatment Team: Treatment Team: Attending Provider: Karie Soda, MD; Registered Nurse: Lynwood Dawley, RN; Technician: Almyra Brace, NT; Registered Nurse: Phillips Hay, RN; Technician: Macy Towana Badger, NT  Subjective:  Had some bright red bleeding at the anus last afternoon. Pain minimal Tolerating solids PO Family  at bedside Walking in hallways  Objective:  Vital signs:  Vitals:   12/03/16 0601 12/03/16 1438 12/03/16 2129 12/04/16 0617  BP:  125/78 107/66  107/83  Pulse:  79 83 84  Resp:  '16 16 16  '$ Temp:  98.3 F (36.8 C) 98.2 F (36.8 C) 98.5 F (36.9 C)  TempSrc:  Oral Oral Oral  SpO2:  96% 97% 96%  Weight: 74.4 kg (164 lb)     Height:        Last BM Date: 12/03/16  Intake/Output   Yesterday:  12/15 0701 - 12/16 0700 In: 800 [P.O.:720; IV Piggyback:80] Out: 2272 [Urine:1175; Drains:520; Stool:577] This shift:  Tyrone intake/output data recorded.  Bowel function:  Flatus: YES  BM:  YES - mildly thickened dark succus  Drain: Serosanguinous, but more serous   Physical Exam:  General: Pt awake/alert/oriented x4 in Tyrone acute distress Eyes: PERRL, normal EOM.  Sclera clear.  Tyrone icterus Neuro: CN II-XII intact w/o focal sensory/motor deficits. Lymph: Tyrone head/neck/groin lymphadenopathy Psych:  Tyrone delerium/psychosis/paranoia HENT: Normocephalic, Mucus membranes moist.  Tyrone thrush Neck: Supple, Tyrone tracheal deviation Chest: Tyrone chest wall pain w good excursion CV:  Pulses intact.  Regular rhythm MS: Normal AROM mjr joints.  Tyrone obvious deformity Abdomen: Soft.  Nondistended.  Mildly tender at incisions only.  Tyrone evidence of peritonitis.  Tyrone incarcerated hernias. Ext:  SCDs BLE.  Tyrone mjr edema.  Tyrone cyanosis Skin: Tyrone petechiae / purpura  Results:   Labs: Results for orders placed or performed during the hospital encounter of 12/01/16 (from the past 48 hour(s))  Magnesium     Status: None   Collection Time: 12/03/16  4:55 AM  Result Value Ref Range   Magnesium 1.9 1.7 - 2.4 mg/dL  Potassium     Status: None   Collection Time: 12/03/16  4:55 AM  Result Value Ref Range   Potassium 3.8 3.5 - 5.1 mmol/L  Creatinine, serum     Status: None   Collection Time: 12/03/16  4:55 AM  Result Value Ref Range   Creatinine, Ser 0.89 0.61 - 1.24 mg/dL   GFR calc non Af Amer >60 >60 mL/min   GFR calc Af Amer >60 >60 mL/min    Comment: (NOTE) The eGFR has been calculated using the CKD EPI equation. This calculation has not been validated in  all clinical situations. eGFR's persistently <60 mL/min signify possible Chronic Kidney Disease.   Hemoglobin     Status: Abnormal   Collection Time: 12/03/16  4:55 AM  Result Value Ref Range   Hemoglobin 11.1 (L) 13.0 - 17.0 g/dL  Potassium     Status: None   Collection Time: 12/04/16  3:47 AM  Result Value Ref Range   Potassium 3.5 3.5 - 5.1 mmol/L  Creatinine, serum     Status: None   Collection Time: 12/04/16  3:47 AM  Result Value Ref Range   Creatinine, Ser 0.72 0.61 - 1.24 mg/dL   GFR calc non Af Amer >60 >60 mL/min   GFR calc Af Amer >60 >60 mL/min    Comment: (NOTE) The eGFR has been calculated using the CKD EPI equation. This calculation has not been validated in all clinical situations. eGFR's persistently <60 mL/min signify possible Chronic Kidney Disease.   Hemoglobin     Status: Abnormal   Collection Time: 12/04/16  3:47 AM  Result Value Ref Range   Hemoglobin 11.2 (L) 13.0 - 17.0 g/dL    Imaging / Studies: Tyrone results found.  Medications / Allergies: per chart  Antibiotics: Anti-infectives    Start     Dose/Rate Route Frequency Ordered Stop   12/02/16 0200  cefoTEtan (CEFOTAN) 2 g in dextrose 5 % 50 mL IVPB     2 g 100 mL/hr over 30 Minutes Intravenous Every 12 hours 12/01/16 1730 12/02/16 0207   12/01/16 1512  clindamycin (CLEOCIN) 900 mg, gentamicin (GARAMYCIN) 240 mg in sodium chloride 0.9 % 1,000 mL for intraperitoneal lavage  Status:  Discontinued       As needed 12/01/16 1513 12/01/16 1549   12/01/16 0600  clindamycin (CLEOCIN) 900 mg, gentamicin (GARAMYCIN) 240 mg in sodium chloride 0.9 % 1,000 mL for intraperitoneal lavage  Status:  Discontinued      Intraperitoneal To Surgery 11/30/16 1330 12/01/16 1709   12/01/16 0553  cefoTEtan (CEFOTAN) 2 g in dextrose 5 % 50 mL IVPB     2 g 100 mL/hr over 30 Minutes Intravenous On call to O.R. 12/01/16 0553 12/01/16 1410        Note: Portions of this report may have been transcribed using voice  recognition software. Every effort was made to ensure accuracy; however, inadvertent computerized transcription errors may be present.   Any transcriptional errors that result from this process are unintentional.     Adin Hector, M.D., F.A.C.S. Gastrointestinal and Minimally Invasive Surgery [facility] [address], [place],  [address] [phone] Main / Paging   12/04/2016

## 2016-12-05 LAB — CREATININE, SERUM
CREATININE: 0.66 mg/dL (ref 0.61–1.24)
GFR calc Af Amer: >60 mL/min (ref >60)
GFR calc non Af Amer: >60 mL/min (ref >60)

## 2016-12-05 LAB — POTASSIUM: POTASSIUM: 3.8 mmol/L (ref 3.5–5.1)

## 2016-12-05 MED ORDER — HEPARIN SOD (PORK) LOCK FLUSH 100 UNIT/ML IV SOLN
250.0000 [IU] | Freq: Every day | INTRAVENOUS | Status: DC
  Administered 2016-12-05: 250 [IU]
  Filled 2016-12-05: qty 3

## 2016-12-05 MED ORDER — HEPARIN SOD (PORK) LOCK FLUSH 100 UNIT/ML IV SOLN
250.0000 [IU] | INTRAVENOUS | Status: DC | PRN
  Administered 2016-12-05: 250 [IU]
  Filled 2016-12-05 (×2): qty 3

## 2016-12-05 NOTE — Care Management Note (Addendum)
Case Management Note  Patient Details  Name: [name] MRN: [id number] Date of Birth: 1971-02-06  Subjective/Objective:  Rectal adenocarcinoma s/p total proctocelectomy                 Action/Plan: Discharge Planning: AVS reviewed: NCM spoke to pt's wife. Wife states pt does not have a PCP listed on his Medicaid card. Explained to wife the importance of follow up with PCP post dc. Contacted [facility] for [facility] RN orders for IV fluids at home. Faxed RX for IV fluids to [facility].   NCM contacted [facility] and they received Rx for IV fluids. Has questions about dosing. Spoke to Dr Brantley Stage to clarify, 250 ml/hr. Contacted [facility] pharmacist, Eliezer Lofts to make aware.     Expected Discharge Date:  12/05/2016           Expected Discharge Plan:  [place]  In-House Referral:  NA  Discharge planning Services  CM Consult  Post Acute Care Choice:  Durable Medical Equipment, Home Health Choice offered to:  Patient  DME Arranged:  IV pump/equipment DME Agency:  [facility] Arranged:  RN, Disease Management [place] Agency:  [facility]  Status of Service:  Completed, signed off  If discussed at [place] of Stay Meetings, dates discussed:    Additional Comments:  Erenest Rasher, RN 12/05/2016, 9:35 AM

## 2016-12-05 NOTE — Discharge Summary (Signed)
Physician Discharge Summary  Patient ID: [name] MRN: [id number] DOB/AGE: 1971-02-03  45 y.o.  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  Postoperatively, the patient gradually mobilized and advanced to a solid diet.  Pain and other symptoms were treated aggressively.    By the time of discharge, the patient was walking well the hallways, eating food, having flatus.  Ileostomy effluent thickening up without any diarrhea.  Pain was well-controlled on an oral medications.   Home health IV fluid infusions.  2 L every Monday Wednesday Friday.  Wound ostomy care nursing did training.  Based on meeting discharge criteria and continuing to recover, I felt it was safe for the patient to be discharged from the hospital to further recover with close followup. Postoperative recommendations were discussed in detail.  They are written as well.  Discharged Condition: good  Disposition:   Follow-up Information    GROSS,[name] C., MD. Schedule an appointment as soon as possible for a visit in 2 week(s).   Specialty:  General Surgery Why:  To follow up after your operation, To follow up after your hospital stay.  To have your drain removed Contact information: [address] [place] [address] [phone]        Truitt Merle, MD. Schedule an appointment as soon as possible for a visit in 1 month(s).   Specialties:  Hematology, Oncology Why:  To follow up after your operation, To follow up after your hospital stay Contact information: [address] [place] [address] [place] [facility] up.   Why:  ostomy care and IV fluids Contact information: [place] [address] [phone]           01-Home or Self Care  Discharge Instructions    Call MD for:    Complete by:  As directed    Temperature > 101.44F   Call MD for:    Complete by:  As directed    FEVER > 101.5 F  (temperatures < 101.5 F are not significant)   Call MD for:  extreme fatigue    Complete by:  As directed    Call MD for:  extreme fatigue    Complete by:  As directed    Call MD for:  hives    Complete by:  As directed    Call MD for:  persistant dizziness or light-headedness    Complete by:  As directed    Call MD for:  persistant nausea and vomiting  Complete by:  As directed    Call MD for:  persistant nausea and vomiting    Complete by:  As directed    Call MD for:  redness, tenderness, or signs of infection (pain, swelling, redness, odor or green/yellow discharge around incision site)    Complete by:  As directed    Call MD for:  redness, tenderness, or signs of infection (pain, swelling, redness, odor or green/yellow discharge around incision site)    Complete by:  As directed    Call MD for:  severe uncontrolled pain    Complete by:  As directed    Call MD for:  severe uncontrolled pain    Complete  by:  As directed    Diet - low sodium heart healthy    Complete by:  As directed    Start with bland, low residue diet for a few days, then advance to a heart healthy (low fat, high fiber) diet.  If you feel nauseated or constipated, simplify to a liquid only diet for 48 hours until you are feeling better (no more nausea, farting/passing gas, having a bowel movement, etc...).  If you cannot tolerate even drinking liquids, or feeling worse, let your surgeon know or go to the Emergency Department for help.   Diet - low sodium heart healthy    Complete by:  As directed    Follow a light diet the first few days at home.   Start with a bland diet such as soups, liquids, starchy foods, low fat foods, etc.   If you feel full, bloated, or constipated, stay on a full liquid or pureed/blenderized diet for a few days until you feel better and no longer constipated. Be sure to drink plenty of fluids every day to avoid getting dehydrated (feeling dizzy, not urinating, etc.). Gradually add a fiber supplement to your diet   Discharge instructions    Complete by:  As directed    Please see discharge instruction sheets.   Also refer to any handouts/printouts that may have been given from the [facility] surgery office (if you visited [place] there before surgery) Please call our office if you have any questions or concerns [phone]   Discharge instructions    Complete by:  As directed    See Discharge Instructions If you are not getting better after two weeks or are noticing you are getting worse, contact our office [phone] for further advice.  We may need to adjust your medications, re-evaluate you in the office, send you to the emergency room, or see what other things we can do to help. The clinic staff is available to answer your questions during regular business hours (8:30am-5pm).  Please don't hesitate to call and ask to speak to one of our nurses for clinical concerns.    A surgeon from [facility]  Surgery is always on call at the hospitals 24 hours/day If you have a medical emergency, go to the nearest emergency room or call 911.   Discharge wound care:    Complete by:  As directed    If you have closed incisions: Shower and bathe over these incisions with soap and water every day.  It is OK to wash over the dressings: they are waterproof. Remove all surgical dressings on postoperative day #3.  You do not need to replace dressings over the closed incisions unless you feel more comfortable with a Band-Aid covering it.   If you have an open wound: That requires packing, so  please see wound care instructions.   In general, remove all dressings, wash wound with soap and water and then replace with saline moistened gauze.  Do the dressing change at least every day.    Please call our office [phone] if you have further questions.   Driving Restrictions    Complete by:  As directed    No driving until off narcotics and can safely swerve away without pain during an emergency   Driving Restrictions    Complete by:  As directed    You may drive when you are no longer taking narcotic prescription pain medication, you can comfortably wear a seatbelt, and you can safely make sudden turns/stops to protect yourself without hesitating due to pain.   Increase activity slowly    Complete by:  As directed    Increase activity slowly    Complete by:  As directed    Start light daily activities --- self-care, walking, climbing stairs- beginning the day after surgery.  Gradually increase activities as tolerated.  Control your pain to be active.  Stop when you are tired.  Ideally, walk several times a day, eventually an hour a day.   Most people are back to most day-to-day activities in a few weeks.  It takes 4-8 weeks to get back to unrestricted, intense activity. If you can walk 30 minutes without difficulty, it is safe to try more intense activity such as jogging, treadmill, bicycling, low-impact  aerobics, swimming, etc. Save the most intensive and strenuous activity for last (Usually 4-8 weeks after surgery) such as sit-ups, heavy lifting, contact sports, etc.  Refrain from any intense heavy lifting or straining until you are off narcotics for pain control.  You will have off days, but things should improve week-by-week. DO NOT PUSH THROUGH PAIN.  Let pain be your guide: If it hurts to do something, don't do it.  Pain is your body warning you to avoid that activity for another week until the pain goes down.   Lifting restrictions    Complete by:  As directed    Avoid heavy lifting initially, <20 pounds at first.   Do not push through pain.   You have no specific weight limit: If it hurts to do, DON'T DO IT.    If you feel no pain, you are not injuring anything.  Pain will protect you from injury.   Coughing and sneezing are far more stressful to your incision than any lifting.   Avoid resuming heavy lifting (>50 pounds) or other intense activity until off all narcotic pain medications.   When want to exercise more, give yourself 2 weeks to gradually get back to full intense exercise/activity.   Lifting restrictions    Complete by:  As directed    If you can walk 30 minutes without difficulty, it is safe to try more intense activity such as jogging, treadmill, bicycling, low-impact aerobics, swimming, etc. Save the most intensive and strenuous activity for last (Usually 4-8 weeks after surgery) such as sit-ups, heavy lifting, contact sports, etc.  Refrain from any intense heavy lifting or straining until you are off narcotics for pain control.  You will have off days, but things should improve week-by-week. DO NOT PUSH THROUGH PAIN.  Let pain be your guide: If it hurts to do something, don't do it.  Pain is your body warning you to avoid that activity for another week until the pain goes down.   May shower / Bathe    Complete by:  As directed    SHOWER EVERY DAY.  It is fine for dressings  or wounds to be washed/rinsed.  Use gentle soap & water.  This will help the incisions and/or wounds get clean & minimize infection.   May walk up steps    Complete by:  As directed    May walk up steps    Complete by:  As directed    No wound care    Complete by:  As directed    It is good for closed incision and even open wounds to be washed every day.  Shower every day.  Short baths are fine.  Wash the incisions and wounds clean with soap & water.    If you have a closed incision(s), wash the incision with soap & water every day.  You may leave closed incisions open to air if it is dry.   You may cover the incision with clean gauze & replace it after your daily shower for comfort. If you have skin tapes (Steristrips) or skin glue (Dermabond) on your incision, leave them in place.  They will fall off on their own like a scab.  You may trim any edges that curl up with clean scissors.  If you have staples, set up an appointment for them to be removed in the office in 10 days after surgery.  If you have a drain, wash around the skin exit site with soap & water and place a new dressing of gauze or band aid around the skin every day.  Keep the drain site clean & dry.   Sexual Activity Restrictions    Complete by:  As directed    Sexual activity as tolerated.  Do not push through pain.  Pain will protect you from injury.   Sexual Activity Restrictions    Complete by:  As directed    You may have sexual intercourse when it is comfortable. If it hurts to do something, stop.   Walk with assistance    Complete by:  As directed    Walk over an hour a day.  May use a walker/cane/companion to help with balance and stamina.      Allergies as of 12/05/2016   No Known Allergies     Medication List    STOP taking these medications   docusate sodium 100 MG capsule Commonly known as:  COLACE     TAKE these medications   ALEVE 220 MG tablet Generic drug:  naproxen sodium Take 440 mg by mouth 2  (two) times daily as needed (for pain.).   ALPRAZolam 0.5 MG tablet Commonly known as:  XANAX Take 1-2 tablets (0.5-1 mg total) by mouth at bedtime as needed for anxiety. What changed:  when to take this  additional instructions   polyethylene glycol packet Commonly known as:  MIRALAX / GLYCOLAX Take 17 g by mouth every other day.   prochlorperazine 10 MG tablet Commonly known as:  COMPAZINE Take 1 tablet (10 mg total) by mouth every 6 (six) hours as needed for nausea or vomiting.   traMADol 50 MG tablet Commonly known as:  ULTRAM Take 1-2 tablets (50-100 mg total) by mouth every 6 (six) hours as needed for moderate pain or severe pain (for pain.). What changed:  reasons to take this       Significant Diagnostic Studies:  Results for orders placed or performed during the hospital encounter of 12/01/16 (from the past 72 hour(s))  Magnesium     Status: None  Collection Time: 12/03/16  4:55 AM  Result Value Ref Range   Magnesium 1.9 1.7 - 2.4 mg/dL  Potassium     Status: None   Collection Time: 12/03/16  4:55 AM  Result Value Ref Range   Potassium 3.8 3.5 - 5.1 mmol/L  Creatinine, serum     Status: None   Collection Time: 12/03/16  4:55 AM  Result Value Ref Range   Creatinine, Ser 0.89 0.61 - 1.24 mg/dL   GFR calc non Af Amer >60 >60 mL/min   GFR calc Af Amer >60 >60 mL/min    Comment: (NOTE) The eGFR has been calculated using the CKD EPI equation. This calculation has not been validated in all clinical situations. eGFR's persistently <60 mL/min signify possible Chronic Kidney Disease.   Hemoglobin     Status: Abnormal   Collection Time: 12/03/16  4:55 AM  Result Value Ref Range   Hemoglobin 11.1 (L) 13.0 - 17.0 g/dL  Potassium     Status: None   Collection Time: 12/04/16  3:47 AM  Result Value Ref Range   Potassium 3.5 3.5 - 5.1 mmol/L  Creatinine, serum     Status: None   Collection Time: 12/04/16  3:47 AM  Result Value Ref Range   Creatinine, Ser 0.72  0.61 - 1.24 mg/dL   GFR calc non Af Amer >60 >60 mL/min   GFR calc Af Amer >60 >60 mL/min    Comment: (NOTE) The eGFR has been calculated using the CKD EPI equation. This calculation has not been validated in all clinical situations. eGFR's persistently <60 mL/min signify possible Chronic Kidney Disease.   Hemoglobin     Status: Abnormal   Collection Time: 12/04/16  3:47 AM  Result Value Ref Range   Hemoglobin 11.2 (L) 13.0 - 17.0 g/dL  Magnesium     Status: None   Collection Time: 12/04/16  3:47 AM  Result Value Ref Range   Magnesium 1.7 1.7 - 2.4 mg/dL  Potassium     Status: None   Collection Time: 12/05/16  3:58 AM  Result Value Ref Range   Potassium 3.8 3.5 - 5.1 mmol/L  Creatinine, serum     Status: None   Collection Time: 12/05/16  3:58 AM  Result Value Ref Range   Creatinine, Ser 0.66 0.61 - 1.24 mg/dL   GFR calc non Af Amer >60 >60 mL/min   GFR calc Af Amer >60 >60 mL/min    Comment: (NOTE) The eGFR has been calculated using the CKD EPI equation. This calculation has not been validated in all clinical situations. eGFR's persistently <60 mL/min signify possible Chronic Kidney Disease.     No results found.  Discharge Exam: Blood pressure 103/66, pulse 86, temperature 98.2 F (36.8 C), temperature source Oral, resp. rate 18, height 5' 11" (1.803 m), weight 74.4 kg (164 lb), SpO2 97 %.  General: Pt awake/alert/oriented x4 in No acute distress Eyes: PERRL, normal EOM.  Sclera clear.  No icterus Neuro: CN II-XII intact w/o focal sensory/motor deficits. Lymph: No head/neck/groin lymphadenopathy Psych:  No delerium/psychosis/paranoia HENT: Normocephalic, Mucus membranes moist.  No thrush Neck: Supple, No tracheal deviation Chest: No chest wall pain w good excursion CV:  Pulses intact.  Regular rhythm MS: Normal AROM mjr joints.  No obvious deformity Abdomen: Soft.  Nondistended.  Nontender.  No evidence of peritonitis.  No incarcerated hernias.  Right paramedian  ileostomy pink with gas and thick succus effluent in bag.   Gen NEMG.  No inguinal hernias Rectal:  No rectal bleeding. Ext:  SCDs BLE.  No mjr edema.  No cyanosis Skin: No petechiae / purpura  Past Medical History:  Diagnosis Date  . Family history of colon cancer   . Poor dental hygiene     Past Surgical History:  Procedure Laterality Date  . EUS N/A 07/29/2016   Procedure: LOWER ENDOSCOPIC ULTRASOUND (EUS);  Surgeon: Milus Banister, MD;  Location: [facility];  Service: Endoscopy;  Laterality: N/A;  . PROCTOSCOPY N/A 12/01/2016   Procedure: RIGID PROCTOSCOPY;  Surgeon: Michael Boston, MD;  Location: [facility];  Service: General;  Laterality: N/A;  . TOE AMPUTATION Left   . XI ROBOTIC ASSISTED LOWER ANTERIOR RESECTION N/A 12/01/2016   Procedure: XI ROBOTIC ASSISTED PROCTOCOLECTOMY WITH ILLEOPOUCH ANASTAMOSIS WITH DIVERTING ILLEOSTOMY;  Surgeon: Michael Boston, MD;  Location: [facility];  Service: General;  Laterality: N/A;    Social History   Social History  . Marital status: Married    Spouse name: N/A  . Number of children: 3  . Years of education: N/A   Occupational History  . Not on file.   Social History Main Topics  . Smoking status: Current Every Day Smoker    Packs/day: 1.50    Years: 30.00    Types: Cigarettes  . Smokeless tobacco: Never Used  . Alcohol use No  . Drug use:     Types: Marijuana     Comment: patient denies  . Sexual activity: Not on file   Other Topics Concern  . Not on file   Social History Narrative   Lives with significant other, [name]   Have a 56 year old son   Smoker      [place]    Family History  Problem Relation Age of Onset  . Diabetes Mother   . COPD Father   . Colon cancer Maternal Aunt     dx in her 45s  . Diabetes Maternal Grandmother   . Brain cancer Maternal Grandfather   . Lung cancer Paternal Grandfather   . Colon cancer Cousin     dx in her 25s  . Bone cancer Sister 8  . Pancreatic  cancer Neg Hx   . Esophageal cancer Neg Hx   . Stomach cancer Neg Hx   . Liver disease Neg Hx     Current Facility-Administered Medications  Medication Dose Route Frequency Provider Last Rate Last Dose  . acetaminophen (TYLENOL) tablet 1,000 mg  1,000 mg Oral TID Michael Boston, MD   1,000 mg at 12/04/16 2117  . ALPRAZolam Duanne Moron) tablet 0.5-1 mg  0.5-1 mg Oral TID PRN Michael Boston, MD      . alum & mag hydroxide-simeth (MAALOX/MYLANTA) 200-200-20 MG/5ML suspension 30 mL  30 mL Oral Q6H PRN Michael Boston, MD   30 mL at 12/04/16 0731  . bismuth subsalicylate (PEPTO BISMOL) 262 MG/15ML suspension 30 mL  30 mL Oral Q8H PRN Michael Boston, MD      . diphenhydrAMINE (BENADRYL) 12.5 MG/5ML elixir 12.5 mg  12.5 mg Oral Q6H PRN Michael Boston, MD       Or  . diphenhydrAMINE (BENADRYL) injection 12.5 mg  12.5 mg Intravenous Q6H PRN Michael Boston, MD   12.5 mg at 12/03/16 1202  . [START ON 12/06/2016] enoxaparin (LOVENOX) injection 40 mg  40 mg Subcutaneous Q24H Michael Boston, MD      . ferrous sulfate tablet 325 mg  325 mg Oral BID WC Michael Boston, MD   325 mg at 12/04/16  1648  . HYDROmorphone (DILAUDID) injection 0.5-2 mg  0.5-2 mg Intravenous Q1H PRN Michael Boston, MD   2 mg at 12/05/16 0548  . lactated ringers bolus 1,000 mL  1,000 mL Intravenous Q8H PRN Michael Boston, MD      . lip balm (CARMEX) ointment 1 application  1 application Topical BID Michael Boston, MD   1 application at 03/54/65 1000  . loperamide (IMODIUM) capsule 2 mg  2 mg Oral Q breakfast Michael Boston, MD   2 mg at 12/04/16 0731  . loperamide (IMODIUM) capsule 2 mg  2 mg Oral QHS Michael Boston, MD   2 mg at 12/04/16 2117  . loperamide (IMODIUM) capsule 2-4 mg  2-4 mg Oral Q8H PRN Michael Boston, MD      . magic mouthwash  15 mL Oral QID PRN Michael Boston, MD      . menthol-cetylpyridinium (CEPACOL) lozenge 3 mg  1 lozenge Oral PRN Michael Boston, MD      . methocarbamol (ROBAXIN) 1,000 mg in dextrose 5 % 50 mL IVPB  1,000 mg Intravenous Q6H PRN  Michael Boston, MD      . methocarbamol (ROBAXIN) tablet 1,000 mg  1,000 mg Oral Q6H PRN Michael Boston, MD      . metoprolol (LOPRESSOR) injection 5 mg  5 mg Intravenous Q6H PRN Michael Boston, MD      . metoprolol tartrate (LOPRESSOR) tablet 12.5 mg  12.5 mg Oral Q12H PRN Michael Boston, MD      . phenol (CHLORASEPTIC) mouth spray 2 spray  2 spray Mouth/Throat PRN Michael Boston, MD      . polyethylene glycol (MIRALAX / GLYCOLAX) packet 8.5 g  8.5 g Oral Daily Michael Boston, MD   8.5 g at 12/03/16 6812  . potassium chloride SA (K-DUR,KLOR-CON) CR tablet 40 mEq  40 mEq Oral Daily Michael Boston, MD   40 mEq at 12/04/16 1001  . prochlorperazine (COMPAZINE) injection 10 mg  10 mg Intravenous Q6H PRN Michael Boston, MD      . prochlorperazine (COMPAZINE) tablet 10 mg  10 mg Oral Q6H PRN Michael Boston, MD      . sodium chloride flush (NS) 0.9 % injection 10-40 mL  10-40 mL Intracatheter PRN Michael Boston, MD   10 mL at 12/05/16 0405  . traMADol (ULTRAM) tablet 50-100 mg  50-100 mg Oral Q6H PRN Michael Boston, MD   100 mg at 12/05/16 0357  . vitamin C (ASCORBIC ACID) tablet 500 mg  500 mg Oral BID Michael Boston, MD   500 mg at 12/04/16 2117     No Known Allergies  Signed: Morton Peters, M.D., F.A.C.S. Gastrointestinal and Minimally Invasive Surgery [facility] [address], [place], [place] [address] [phone] Main / Paging   12/05/2016, 7:04 AM

## 2016-12-09 ENCOUNTER — Telehealth: Payer: Self-pay | Admitting: *Deleted

## 2016-12-09 NOTE — Telephone Encounter (Signed)
Oncology Nurse Navigator Documentation  Oncology Nurse Navigator Flowsheets 12/09/2016  Navigator Location [facility]  Referral date to RadOnc/MedOnc -  Navigator Encounter Type Telephone  Telephone Outgoing Call;Patient Update  Abnormal Finding Date -  Confirmed Diagnosis Date -  Treatment Initiated Date -  Patient Visit Type -  Treatment Phase -  Barriers/Navigation Needs No barriers at this time;No Questions;No Needs  Education -  Interventions None required  Referrals -  Coordination of Care -  Education Method -  Support Groups/Services -  Acuity Level 1  Time Spent with Patient 15  Reports he is still having IV fluids every M-W-F and his ileostomy is functioning well. Stool output is manageable. He is eating well and getting out. Encouraged to call for any questions or concerns.

## 2016-12-28 ENCOUNTER — Ambulatory Visit (HOSPITAL_BASED_OUTPATIENT_CLINIC_OR_DEPARTMENT_OTHER): Payer: Medicaid Other | Admitting: Hematology

## 2016-12-28 ENCOUNTER — Encounter: Payer: Self-pay | Admitting: Hematology

## 2016-12-28 ENCOUNTER — Other Ambulatory Visit (HOSPITAL_BASED_OUTPATIENT_CLINIC_OR_DEPARTMENT_OTHER): Payer: Medicaid Other

## 2016-12-28 ENCOUNTER — Telehealth: Payer: Self-pay | Admitting: Hematology

## 2016-12-28 VITALS — BP 110/77 | HR 87 | Temp 98.0°F | Resp 18 | Ht 71.0 in | Wt 146.2 lb

## 2016-12-28 DIAGNOSIS — R109 Unspecified abdominal pain: Secondary | ICD-10-CM

## 2016-12-28 DIAGNOSIS — C20 Malignant neoplasm of rectum: Secondary | ICD-10-CM | POA: Diagnosis present

## 2016-12-28 DIAGNOSIS — F411 Generalized anxiety disorder: Secondary | ICD-10-CM

## 2016-12-28 DIAGNOSIS — R918 Other nonspecific abnormal finding of lung field: Secondary | ICD-10-CM | POA: Diagnosis not present

## 2016-12-28 LAB — COMPREHENSIVE METABOLIC PANEL
ALK PHOS: 71 U/L (ref 40–150)
ALT: 29 U/L (ref 0–55)
AST: 23 U/L (ref 5–34)
Albumin: 2.5 g/dL — ABNORMAL LOW (ref 3.5–5.0)
Anion Gap: 6 mEq/L (ref 3–11)
BUN: 10.2 mg/dL (ref 7.0–26.0)
CO2: 27 meq/L (ref 22–29)
Calcium: 8.9 mg/dL (ref 8.4–10.4)
Chloride: 107 mEq/L (ref 98–109)
Creatinine: 0.8 mg/dL (ref 0.7–1.3)
Glucose: 120 mg/dl (ref 70–140)
POTASSIUM: 4.5 meq/L (ref 3.5–5.1)
Sodium: 139 mEq/L (ref 136–145)
TOTAL PROTEIN: 5.5 g/dL — AB (ref 6.4–8.3)

## 2016-12-28 LAB — CBC WITH DIFFERENTIAL/PLATELET
BASO%: 1 % (ref 0.0–2.0)
BASOS ABS: 0.1 10*3/uL (ref 0.0–0.1)
EOS ABS: 0.5 10*3/uL (ref 0.0–0.5)
EOS%: 8.1 % — ABNORMAL HIGH (ref 0.0–7.0)
HCT: 40.3 % (ref 38.4–49.9)
HGB: 13.4 g/dL (ref 13.0–17.1)
LYMPH%: 10.4 % — AB (ref 14.0–49.0)
MCH: 29.6 pg (ref 27.2–33.4)
MCHC: 33.4 g/dL (ref 32.0–36.0)
MCV: 88.7 fL (ref 79.3–98.0)
MONO#: 0.4 10*3/uL (ref 0.1–0.9)
MONO%: 6.7 % (ref 0.0–14.0)
NEUT%: 73.8 % (ref 39.0–75.0)
NEUTROS ABS: 4.5 10*3/uL (ref 1.5–6.5)
PLATELETS: 361 10*3/uL (ref 140–400)
RBC: 4.54 10*6/uL (ref 4.20–5.82)
RDW: 14 % (ref 11.0–14.6)
WBC: 6.1 10*3/uL (ref 4.0–10.3)
lymph#: 0.6 10*3/uL — ABNORMAL LOW (ref 0.9–3.3)

## 2016-12-28 LAB — TECHNOLOGIST REVIEW

## 2016-12-28 MED ORDER — ALPRAZOLAM 0.5 MG PO TABS: 0.2500 mg | ORAL_TABLET | Freq: Every evening | ORAL | 0 refills | Status: DC | PRN

## 2016-12-28 MED ORDER — CAPECITABINE 500 MG PO TABS: ORAL_TABLET | ORAL | 5 refills | Status: DC

## 2016-12-28 MED ORDER — OXYCODONE HCL 5 MG PO TABS: 5.0000 mg | ORAL_TABLET | Freq: Four times a day (QID) | ORAL | 0 refills | Status: DC | PRN

## 2016-12-28 NOTE — Telephone Encounter (Signed)
Gave relative avs report and appointments for January and February. Office visits scheduled for 1/23 the week after 1/18 tx and 2/8 - date of 2/28 on los is typo.

## 2016-12-28 NOTE — Progress Notes (Signed)
[place]  [phone] Fax:([phone]) [phone]  Clinic follow Up Note   Patient Care Team: [name] as PCP - General (General Practice) Tania Ade, RN as Registered Nurse Michael Boston, MD as Consulting Physician (General Surgery) Doran Stabler, MD as Consulting Physician (Gastroenterology) Kyung Rudd, MD as Consulting Physician (Radiation Oncology) Truitt Merle, MD as Consulting Physician (Oncology) 12/28/2016  CHIEF COMPLAINTS:  Follow up rectal cancer  Oncology History   Rectal adenocarcinoma [facility])   Staging form: Colon and Rectum, AJCC 7th Edition   - Clinical stage from 07/28/2016: Stage IIIB (T3, N2a, M0) - Signed by Truitt Merle, MD on 08/06/2016      Rectal adenocarcinoma s/p protctocolectomy, IPAA "J" pouch 12/01/2016   07/26/2016 Imaging    CT chest, abdomen and pelvis with contrast showed nodular thickening of the rectal wall, enlarged perirectal lymph nodes. Diverticulosis. Nonspecific low-level lymphadenopathy. Lower lobe emphysema, small 3-4 mm left lower lobe lung nodules.      07/28/2016 Initial Diagnosis    Rectal adenocarcinoma ([place])      07/28/2016 Initial Biopsy    Rectal mass biopsy showed invasive adenocarcinoma, polyps in the ascending colon and rectum showed tubular adenoma       07/28/2016 Procedure    Colonoscopy showed a nearly circumferential mass in the distal rectum, 4 polyps in the descending colon, diverticulosis in the left colon.       07/29/2016 Procedure    Low EUS showed clearly malignant 6 cm long, nearly circumferential mass in the distal rectum, with distal edge located to 1 cm from the annual approach, multiple prior rectal lymph nodes (6) are suspicious ,uT3 N2       08/17/2016 -  Radiation Therapy    Neoadjuvant radiation to his rectal cancer      08/17/2016 -  Chemotherapy    Xeloda 1500 mg twice daily with concurrent irradiation      08/18/2016 Imaging    PET scan showed hypermetabolic rectal mass,  perirectal node 37m with mild increased uptake, no hypermetabolic distant metastasis, small pulmonary nodules are too small to characterize by PET.       12/01/2016 Surgery    Colon total resection and proctocolectomy for rectal cancer and Lynch syndrome       12/01/2016 Pathology Results    Proctocolectomy showed scattered small residual foci of invasive moderately differentiated adenocarcinoma embedded in submucosa tissue with extensive neoadjuvant effect, 2.5 cm in greatest time mention, adenocarcinoma invades through muscularis mucosa and involves subserosal soft tissue, margins are negative, 2 of 35 lymph nodes are positive for metastatic adenocarcinoma, 2 calcified and necrotic soft tissue nodules are also present without visible tumor seen.       HISTORY OF PRESENTING ILLNESS:  Tyrone NITTA46y.o. male is here because of His newly diagnosed rectal cancer. He is accompanied by his girlfriend to our multidisciplinary GI clinic today.  He has had rectal pain for 6 weeks, and mild rectal bleeding with BM, he goes 4 times a day, very little each time, and pencil shaped stool, he has lost 30 lbs so far. His appettie remains to be good, he eats well, no cough, nausea, no abd pain. He has a moderate fatigue, he will 2 tolerate his routine activities including his job without much difficulty. He does not have a primary care physician, and has not seen doctors for many years.  He presents to emergency room on 07/26/2016 for the rectal pain and bleeding, and  was referred to gastroenterologist Dr. Loletha Carrow. He underwent colonoscopy on 07/28/2016, which showed 4 polyps in the ascending colon, and a circumferential distal rectal mass. Biopsy of the rectal mass showed adenocarcinoma. He underwent EUS by Dr. Ardis Hughs on 07/29/2016 which showed a T3 N2 disease.   CURRENT THERAPY:  pending adjuvant chemo CAPOX   INTERIM HISTORY  Mr. Calo returns for follow-up. He underwent Total colectomy and proctocolectomy  for his rectal cancer and Lynch syndrome. He tolerated surgery well, had no major competitions. His abdominal draining tube was removed last week. He has recovered well also, his main complaint is his pain at the ileostomy site, and previous draining tube site. He takes tramadol, but diffuse his pain is not well controlled. His appetite and energy level has recovered well, he has gained 5-6 pounds since his surgery. He has not gone back to work, but able to tolerate a routine activities at home well.   MEDICAL HISTORY:  Past Medical History:  Diagnosis Date  . Family history of colon cancer   . Poor dental hygiene        SURGICAL HISTORY: Past Surgical History:  Procedure Laterality Date  . EUS N/A 07/29/2016   Procedure: LOWER ENDOSCOPIC ULTRASOUND (EUS);  Surgeon: Milus Banister, MD;  Location: [facility];  Service: Endoscopy;  Laterality: N/A;  . PROCTOSCOPY N/A 12/01/2016   Procedure: RIGID PROCTOSCOPY;  Surgeon: Michael Boston, MD;  Location: [facility];  Service: General;  Laterality: N/A;  . TOE AMPUTATION Left   . XI ROBOTIC ASSISTED LOWER ANTERIOR RESECTION N/A 12/01/2016   Procedure: XI ROBOTIC ASSISTED PROCTOCOLECTOMY WITH ILLEOPOUCH ANASTAMOSIS WITH DIVERTING ILLEOSTOMY;  Surgeon: Michael Boston, MD;  Location: [facility];  Service: General;  Laterality: N/A;    SOCIAL HISTORY: Social History   Social History  . Marital status: Married    Spouse name: N/A  . Number of children: 3  . Years of education: N/A   Occupational History  . Not on file.   Social History Main Topics  . Smoking status: Current Every Day Smoker    Packs/day: 1.50    Years: 30.00    Types: Cigarettes  . Smokeless tobacco: Never Used  . Alcohol use No  . Drug use:     Types: Marijuana     Comment: patient denies  . Sexual activity: Not on file   Other Topics Concern  . Not on file   Social History Narrative   Lives with significant other, [name]   Have a 64 year old son   Smoker       [place] HISTORY: Family History  Problem Relation Age of Onset  . Diabetes Mother   . COPD Father   . Colon cancer Maternal Aunt     dx in her 51s  . Diabetes Maternal Grandmother   . Brain cancer Maternal Grandfather   . Lung cancer Paternal Grandfather   . Colon cancer Cousin     dx in her 61s  . Bone cancer Sister 8  . Pancreatic cancer Neg Hx   . Esophageal cancer Neg Hx   . Stomach cancer Neg Hx   . Liver disease Neg Hx    He lives with his girfriend the their 41 yo son, works for [employer]   ALLERGIES:  has No Known Allergies.  MEDICATIONS:  Current Outpatient Prescriptions  Medication Sig Dispense Refill  . ALPRAZolam (XANAX) 0.5 MG tablet Take 0.5 tablets (0.25 mg total) by mouth  at bedtime as needed for anxiety. For anxiety 20 tablet 0  . naproxen sodium (ALEVE) 220 MG tablet Take 440 mg by mouth 2 (two) times daily as needed (for pain.).    Marland Kitchen polyethylene glycol (MIRALAX / GLYCOLAX) packet Take 17 g by mouth every other day.     . prochlorperazine (COMPAZINE) 10 MG tablet Take 1 tablet (10 mg total) by mouth every 6 (six) hours as needed for nausea or vomiting. 30 tablet 2  . traMADol (ULTRAM) 50 MG tablet Take 1-2 tablets (50-100 mg total) by mouth every 6 (six) hours as needed for moderate pain or severe pain (for pain.). 30 tablet 0  . oxyCODONE (OXY IR/ROXICODONE) 5 MG immediate release tablet Take 1-2 tablets (5-10 mg total) by mouth every 6 (six) hours as needed for severe pain. 60 tablet 0   Current Facility-Administered Medications  Medication Dose Route Frequency Provider Last Rate Last Dose  . 0.9 %  sodium chloride infusion  500 mL Intravenous Continuous Nelida Meuse III, MD        REVIEW OF SYSTEMS: Constitutional: Denies fevers, chills or abnormal night sweats Eyes: Denies blurriness of vision, double vision or watery eyes Ears, nose, mouth, throat, and face: Denies mucositis or sore throat Respiratory: Denies cough, dyspnea  or wheezes Cardiovascular: Denies palpitation, chest discomfort or lower extremity swelling Gastrointestinal:  Denies nausea, heartburn or change in bowel habits Skin: Denies abnormal skin rashes Lymphatics: Denies new lymphadenopathy or easy bruising Neurological:Denies numbness, tingling or new weaknesses Behavioral/Psych: Mood is stable, no new changes  All other systems were reviewed with the patient and are negative.  PHYSICAL EXAMINATION: ECOG PERFORMANCE STATUS: 1  Vitals:   12/28/16 1322  BP: 110/77  Pulse: 87  Resp: 18  Temp: 98 F (36.7 C)   Filed Weights   12/28/16 1322  Weight: 146 lb 3.2 oz (66.3 kg)    GENERAL:alert, no distress and comfortable SKIN: skin color, texture, turgor are normal, no rashes or significant lesions EYES: normal, conjunctiva are pink and non-injected, sclera clear OROPHARYNX:no exudate, no erythema and lips, buccal mucosa, and tongue normal  NECK: supple, thyroid normal size, non-tender, without nodularity LYMPH:  no palpable lymphadenopathy in the cervical, axillary or inguinal LUNGS: clear to auscultation and percussion with normal breathing effort HEART: regular rate & rhythm and no murmurs and no lower extremity edema ABDOMEN:abdomen soft, non-tender and normal bowel sounds. (+) Ileostomy back in the right abdomen, surgical incision sites has healed well.  Musculoskeletal:no cyanosis of digits and no clubbing  PSYCH: alert & oriented x 3 with fluent speech NEURO: no focal motor/sensory deficits  LABORATORY DATA:  I have reviewed the data as listed CBC Latest Ref Rng & Units 12/28/2016 12/04/2016 12/03/2016  WBC 4.0 - 10.3 10e3/uL 6.1 - -  Hemoglobin 13.0 - 17.1 g/dL 13.4 11.2(L) 11.1(L)  Hematocrit 38.4 - 49.9 % 40.3 - -  Platelets 140 - 400 10e3/uL 361 - -   CMP Latest Ref Rng & Units 12/28/2016 12/05/2016 12/04/2016  Glucose 70 - 140 mg/dl 120 - -  BUN 7.0 - 26.0 mg/dL 10.2 - -  Creatinine 0.7 - 1.3 mg/dL 0.8 0.66 0.72  Sodium  136 - 145 mEq/L 139 - -  Potassium 3.5 - 5.1 mEq/L 4.5 3.8 3.5  Chloride 101 - 111 mmol/L - - -  CO2 22 - 29 mEq/L 27 - -  Calcium 8.4 - 10.4 mg/dL 8.9 - -  Total Protein 6.4 - 8.3 g/dL 5.5(L) - -  Total  Bilirubin 0.20 - 1.20 mg/dL <0.22 - -  Alkaline Phos 40 - 150 U/L 71 - -  AST 5 - 34 U/L 23 - -  ALT 0 - 55 U/L 29 - -   Pathology report  Diagnosis 07/28/2016 1. Surgical [P], descending, polyps(2) -TUBULAR ADENOMAS. -NO HIGH GRADE DYSPLASIA OR MALIGNANCY IDENTIFIED. 2. Surgical [P], rectal mass -INVASIVE ADENOCARCINOMA. -SEE COMMENT. 3. Surgical [P], rectum, polyps(2) -TUBULAR ADENOMAS. -NO HIGH GRADE DYSPLASIA OR MALIGNANCY IDENTIFIED. Microscopic Comment 2. Internal departmental review obtained (Dr. Saralyn Pilar) with agreement. Results are phoned to [facility], nurse in the office of Dr. Loletha Carrow. (MEG 08/03/16)    Diagnosis 12/01/2016 1. Colon, total resection (incl lymph nodes), proctocolectomy - SCATTERED SMALL RESIDUAL FOCI (EACH LESS THAN 0.3 CM) OF INVASIVE MODERATELY DIFFERENTIATED ADENOCARCINOMA EMBEDDED IN SUBMUCOSAL TISSUE WITH EXTENSIVE NEOADJUVANT EFFECT. - TUMOR IS PRESENT WITHIN A REGION MEASURING APPROXIMATELY 2.5 CM IN GREATEST DIMENSION. - ADENOCARCINOMA INVADES THROUGH MUSCULARIS MUCOSA AND INVOLVES SUBSEROSAL SOFT TISSUE. - MARGINS ARE NEGATIVE. - TWO OF THIRTY-FIVE LYMPH NODES ARE POSITIVE FOR METASTATIC ADENOCARCINOMA (2/35). - LYMPH NODES ALSO DEMONSTRATE VARIABLE DEGREES OF NEOADJUVANT EFFECT. - TWO CALCIFIED AND NECROTIC SOFT TISSUE NODULES ARE ALSO PRESENT WITHOUT VIABLE TUMOR SEEN. - SEE ONCOLOGY TEMPLATE. 2. Colon, resection margin (donut), distal ring final distal margin - BENIGN ANAL MUCOSA. - NO DYSPLASIA OR TUMOR SEEN. Microscopic Comment 1. COLON AND RECTUM (INCLUDING TRANS-ANAL RESECTION): Specimen: Colon and rectum with attached appendix. Procedure: Robotic assisted proctocolectomy. Tumor site: Rectum. Specimen integrity: Intact. Macroscopic  intactness of mesorectum: Near complete: The mesorectum is near complete with coning at the distal margin. Macroscopic tumor perforation: No. Invasive tumor: Maximum size: There is a 2.5 cm area of ulcerated colon with neoadjuvant changes and scattered small (less than 0.3 cm) foci of tumor. Histologic type(s): Invasive adenocarcinoma. Histologic grade and differentiation: G2: moderately differentiated/low grade. Type of polyp in which invasive carcinoma arose: Precursor polyp is not identified. Microscopic extension of invasive tumor: Tumor invades through muscularis propria to involve subserosal soft tissues. 1 of 3 FINAL for Krisher, Eduard T [phone]) Microscopic Comment(continued) Lymph-Vascular invasion: Lymph/vascular invasion is not identified; however, several lymph nodes are positive for metastatic tumor, see below. Peri-neural invasion: Not identified. Tumor deposit(s) (discontinuous extramural extension): No viable tumor deposits are identified. Resection margins (transanal resection margins): Proximal margin: Negative. Distal margin: Negative. Circumferential (radial) (posterior ascending, posterior descending; lateral and posterior mid-rectum; and entire lower 1/3 rectum): Negative. Mucosal margin: Negative. Distance closest margin (if all above margins negative): 0.5 cm (radial / circumferential soft tissue margin in distal rectum). Distance closest mucosal margin (if negative): Greater than 0.6 cm (based on amount of tissue within cassettes adjacent to distal stapled margin). Treatment effect (neo-adjuvant therapy): Prominent treatment effect is identified within the tumor and within the lymph nodes. Additional polyp(s): No additional polyps identified. Non-neoplastic findings: Diverticulosis. Lymph nodes: number examined 35; number positive: 2. Pathologic Staging: ypT3, ypN1b. Ancillary studies: As the patient is status post neoadjuvant therapy and there are only  small residual foci of tumor, additional studies will not be performed unless otherwise requested.    RADIOGRAPHIC STUDIES: I have personally reviewed the radiological images as listed and agreed with the findings in the report. Ct Chest W Contrast  Result Date: 11/29/2016 CLINICAL DATA:  Rectal carcinoma diagnosed in August 2017. Restaging post colon resection and chemotherapy. Oral chemotherapy ongoing. EXAM: CT CHEST, ABDOMEN, AND PELVIS WITH CONTRAST TECHNIQUE: Multidetector CT imaging of the chest, abdomen and pelvis was performed following the standard protocol during  bolus administration of intravenous contrast. CONTRAST:  165m ISOVUE-300 IOPAMIDOL (ISOVUE-300) INJECTION 61% COMPARISON:  Abdominopelvic CT 07/26/2016, chest CT 08/02/2016 and PET-CT 08/18/2016. FINDINGS: CT CHEST FINDINGS Cardiovascular: There are no significant vascular findings. The heart size is normal. There is no pericardial effusion. Mediastinum/Nodes: Stable small axillary, hilar and mediastinal lymph nodes, not pathologically enlarged. The thyroid gland, trachea and esophagus demonstrate no significant findings. Lungs/Pleura: There is no pleural effusion. Again demonstrated is age advanced centrilobular and paraseptal emphysema. There are scattered pulmonary nodules bilaterally which are stable. Those on the right are perifissural/subpleural in location, seen in the right upper lobe (image 65) and in the lower lobe (image 56). There are 3 left upper lobe pulmonary nodules which are stable, measuring 5 mm on image 34, 5 mm on image 36 and 6 mm on image 38. No new or enlarging pulmonary nodules are seen. Musculoskeletal/Chest wall: No chest wall mass or suspicious osseous findings. CT ABDOMEN AND PELVIS FINDINGS Hepatobiliary: The liver is normal in density without focal abnormality. No evidence of gallstones, gallbladder wall thickening or biliary dilatation. Pancreas: Unremarkable. No pancreatic ductal dilatation or  surrounding inflammatory changes. Spleen: Normal in size without focal abnormality. Adrenals/Urinary Tract: Both adrenal glands appear normal. No suspicious renal findings. Probable tiny right renal cysts. No evidence of urinary tract calculus or hydronephrosis. The bladder appears unremarkable. Stomach/Bowel: The stomach, small bowel, appendix and proximal colon appear normal. Previously demonstrated rectosigmoid wall thickening appears improved. There is sigmoid diverticulosis without acute surrounding inflammation. No evidence of bowel obstruction. Vascular/Lymphatic: Scattered small lymph nodes within the small bowel mesentery are improved. There is no retroperitoneal or pelvic lymphadenopathy. No significant vascular findings are present. Reproductive: The prostate gland and seminal vesicles appear normal. Other: Mild perirectal soft tissue stranding, likely treatment related. No focal fluid collection. No generalized ascites or anterior abdominal wall abnormality. Musculoskeletal: No acute or significant osseous findings. 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 IMPRESSION: 1. Interval treatment changes within the perirectal fat with otherwise stable rectosigmoid colon wall thickening. 2. No progressive adenopathy or distant metastases identified. 3. Stable bilateral pulmonary nodules. Short-term stability (for 4 months) is reassuring, although the nodules are too small to optimally characterize by previous PET-CT. Continued CT follow-up recommended. Electronically Signed   By: WRichardean SaleM.D.   On: 11/29/2016 17:16   Ct Abdomen Pelvis W Contrast  Result Date: 11/29/2016 CLINICAL DATA:  Rectal carcinoma diagnosed in August 2017. Restaging post colon resection and chemotherapy. Oral chemotherapy ongoing. EXAM: CT CHEST, ABDOMEN, AND PELVIS WITH CONTRAST TECHNIQUE: Multidetector CT imaging of the chest, abdomen and pelvis was  performed following the standard protocol during bolus administration of intravenous contrast. CONTRAST:  1022mISOVUE-300 IOPAMIDOL (ISOVUE-300) INJECTION 61% COMPARISON:  Abdominopelvic CT 07/26/2016, chest CT 08/02/2016 and PET-CT 08/18/2016. FINDINGS: CT CHEST FINDINGS Cardiovascular: There are no significant vascular findings. The heart size is normal. There is no pericardial effusion. Mediastinum/Nodes: Stable small axillary, hilar and mediastinal lymph nodes, not pathologically enlarged. The thyroid gland, trachea and esophagus demonstrate no significant findings. Lungs/Pleura: There is no pleural effusion. Again demonstrated is age advanced centrilobular and paraseptal emphysema. There are scattered pulmonary nodules bilaterally which are stable. Those on the right are perifissural/subpleural in location, seen in the right upper lobe (image 65) and in the lower lobe (image 56). There are 3 left upper lobe pulmonary nodules which are stable, measuring 5 mm on image 34, 5 mm on image 36 and  6 mm on image 38. No new or enlarging pulmonary nodules are seen. Musculoskeletal/Chest wall: No chest wall mass or suspicious osseous findings. CT ABDOMEN AND PELVIS FINDINGS Hepatobiliary: The liver is normal in density without focal abnormality. No evidence of gallstones, gallbladder wall thickening or biliary dilatation. Pancreas: Unremarkable. No pancreatic ductal dilatation or surrounding inflammatory changes. Spleen: Normal in size without focal abnormality. Adrenals/Urinary Tract: Both adrenal glands appear normal. No suspicious renal findings. Probable tiny right renal cysts. No evidence of urinary tract calculus or hydronephrosis. The bladder appears unremarkable. Stomach/Bowel: The stomach, small bowel, appendix and proximal colon appear normal. Previously demonstrated rectosigmoid wall thickening appears improved. There is sigmoid diverticulosis without acute surrounding inflammation. No evidence of bowel  obstruction. Vascular/Lymphatic: Scattered small lymph nodes within the small bowel mesentery are improved. There is no retroperitoneal or pelvic lymphadenopathy. No significant vascular findings are present. Reproductive: The prostate gland and seminal vesicles appear normal. Other: Mild perirectal soft tissue stranding, likely treatment related. No focal fluid collection. No generalized ascites or anterior abdominal wall abnormality. Musculoskeletal: No acute or significant osseous findings. 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 IMPRESSION: 1. Interval treatment changes within the perirectal fat with otherwise stable rectosigmoid colon wall thickening. 2. No progressive adenopathy or distant metastases identified. 3. Stable bilateral pulmonary nodules. Short-term stability (for 4 months) is reassuring, although the nodules are too small to optimally characterize by previous PET-CT. Continued CT follow-up recommended. Electronically Signed   By: Richardean Sale M.D.   On: 11/29/2016 17:16   EUS 07/29/2016 -Clearly malignant 6cm long, nearly circumferential mass in the distal rectum with distal edge located 1cm from the anal verge. If this is rectal adenocarcinoma it is uT3N2a, Stage IIIa.  COLONOSCOPY 07/28/2016 - Rectal mass. - Four 4 mm polyps in the rectum and in the descending colon, removed with a cold snare. Resected and retrieved. - Diverticulosis in the left colon. - Likely malignant tumor in the distal rectum. Biopsied. Tattooed.  CT of the chest/abd/pelvis with contrast 11/29/2016 IMPRESSION: 1. Interval treatment changes within the perirectal fat with otherwise stable rectosigmoid colon wall thickening. 2. No progressive adenopathy or distant metastases identified. 3. Stable bilateral pulmonary nodules. Short-term stability (for 4 months) is reassuring, although the nodules are too small to optimally  characterize by previous PET-CT. Continued CT follow-up recommended.   ASSESSMENT & PLAN: 46 y.o. male without significant past medical history presented with rectal pain and bleeding.  1. 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 He also has a slightly prominent periaortic lymph node, about 8 mm. -I previously reviewed his PET CT scan findings with patient and his girl firend, the small lung nodules and periaortic lymph node are not hypermetabolic, no definitive evidence of metastasis on the PET scan. -He completed neoadjuvant chemoradiation, tolerated moderate well overall.  -I reviewed his surgical pathology findings in great details. He had computed surgical resection, but unfortunately still has significant residual disease including 2 positive lymph nodes. -I recommend him to have adjuvant chemotherapy to reduce his risk of recurrence. Due to the positive lymph nodes, I recommend FOLFOX or CAPOX. Potential side effects  and benefits, logistics of the regimens, all discussed with patient, he opted CAPOX (Xeloda on day 1-14, oxaliplatin on day 1, every 21 days) for a total of 6 cycles  --Chemotherapy consent: Side effects including but does not not limited to, fatigue, nausea, vomiting, diarrhea, hair loss, neuropathy, fluid retention, renal and kidney dysfunction, neutropenic fever, needed for blood transfusion, bleeding, were discussed with patient in great detail. She agrees to proceed. -The goal of therapy is curative -He has recovered well from surgery, I'll tentatively schedule him to start adjuvant chemotherapy next week  2.  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 -He has undergone total colectomy and proctocolectomy, his risk of secondary colon cancer is minimum -We discussed other cancer risks from age syndrome, I recommend him to have EGD every 3-5 years, annual urinalysis for GU cancer screening.   3. Anxiety  -He feels nervous and anxious since his cancer diagnosis, much improved after starting treatment  -Continue Xanax as needed, I refilled for him   4. Social issues -He is out of work, he wants to apply for Medicaid and disability  -He was seen by our Education officer, museum  5. Abdominal pain, secondary to surgery -I refilled his Oxycodone   Plan -Patient has recovered well from surgery -will start adjuvant chemotherapy with oxaliplatin and Xeloda next week, I'll send a Xeloda prescription to his specialty pharmacy -I will see him back in 2 weeks for follow up after first cycle chemo    All questions were answered. The patient knows to call the clinic with any problems, questions or concerns. I spent 25 minutes counseling the patient face to face. The total time spent in the appointment was 35 minutes and more than 50% was on counseling.     Truitt Merle, MD 12/28/2016

## 2016-12-28 NOTE — Progress Notes (Signed)
START ON PATHWAY REGIMEN - Colorectal  ROS59: Capecitabine 1,000 mg/m2 PO BID on Days 1-14, Followed by 7 Days Off, q21 Days x 6 Cycles   A cycle is every 21 days:     Capecitabine (Xeloda(R)) 1,000 mg/m2 orally twice daily (2000 mg/m2/day) for 14 days followed by 7 days off) Dose Mod: None  **Always confirm dose/schedule in your pharmacy ordering system**    Patient Characteristics: Rectal Neoadjuvant/Adjuvant, T3 - T4, N0 or Any T, N+**, Postoperative - Had Neoadjuvant Therapy - Preoperative Node Positive AJCC T Stage: 3 AJCC N Stage: 2a AJCC Stage Grouping: IIIB AJCC M Stage: 0 Current evidence of distant metastases? No  Intent of Therapy: Curative Intent, Discussed with Patient

## 2016-12-28 NOTE — Addendum Note (Signed)
Addended by: Truitt Merle on: 12/28/2016 09:53 PM   Modules accepted: Orders

## 2016-12-30 ENCOUNTER — Other Ambulatory Visit: Payer: Self-pay | Admitting: Hematology

## 2016-12-30 DIAGNOSIS — C20 Malignant neoplasm of rectum: Secondary | ICD-10-CM

## 2016-12-30 MED ORDER — PROCHLORPERAZINE MALEATE 10 MG PO TABS: 10.0000 mg | ORAL_TABLET | Freq: Four times a day (QID) | ORAL | 1 refills | Status: DC | PRN

## 2016-12-30 MED ORDER — CAPECITABINE 500 MG PO TABS: ORAL_TABLET | ORAL | 5 refills | Status: DC

## 2016-12-30 MED ORDER — ONDANSETRON HCL 8 MG PO TABS
8.0000 mg | ORAL_TABLET | Freq: Two times a day (BID) | ORAL | 1 refills | Status: DC | PRN
Start: 2016-12-30 — End: 2017-01-27

## 2016-12-30 MED FILL — ONDANSETRON HCL 8 MG TABLET: 8 | 15 days supply | Qty: 30 | Fill #0

## 2016-12-30 MED FILL — PROCHLORPERAZINE 10 MG TAB: 10 | 7 days supply | Qty: 30 | Fill #0

## 2017-01-03 ENCOUNTER — Telehealth: Payer: Self-pay | Admitting: Pharmacist

## 2017-01-03 MED FILL — CAPECITABINE 500 MG TABLET: 500 | 14 days supply | Qty: 98 | Fill #0

## 2017-01-03 NOTE — Telephone Encounter (Signed)
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 Labs from 12/28/16 reviewed, OK for treatment.  Current medication list in [facility] assessed, no significant DDIs with Xeloda identified.  Prescription will be sent to [facility] for benefits analysis.  Oral Oncology Clinic will continue to follow.  Johny Drilling, PharmD, BCPS, BCOP 01/03/2017  3:04 PM Oral Oncology Clinic [phone]

## 2017-01-04 ENCOUNTER — Ambulatory Visit: Payer: Medicaid Other | Admitting: Gastroenterology

## 2017-01-04 ENCOUNTER — Other Ambulatory Visit: Payer: Self-pay

## 2017-01-06 ENCOUNTER — Telehealth: Payer: Self-pay | Admitting: *Deleted

## 2017-01-06 ENCOUNTER — Ambulatory Visit: Payer: Medicaid Other

## 2017-01-06 ENCOUNTER — Other Ambulatory Visit: Payer: Medicaid Other

## 2017-01-06 NOTE — Telephone Encounter (Signed)
Per patient request I have moved appts from today to tomorrow. Patient aware

## 2017-01-07 ENCOUNTER — Ambulatory Visit (HOSPITAL_BASED_OUTPATIENT_CLINIC_OR_DEPARTMENT_OTHER): Payer: Medicaid Other

## 2017-01-07 ENCOUNTER — Other Ambulatory Visit (HOSPITAL_BASED_OUTPATIENT_CLINIC_OR_DEPARTMENT_OTHER): Payer: Medicaid Other

## 2017-01-07 VITALS — BP 103/66 | HR 62 | Temp 97.7°F | Resp 18

## 2017-01-07 DIAGNOSIS — Z5111 Encounter for antineoplastic chemotherapy: Secondary | ICD-10-CM | POA: Diagnosis not present

## 2017-01-07 DIAGNOSIS — C20 Malignant neoplasm of rectum: Secondary | ICD-10-CM | POA: Diagnosis not present

## 2017-01-07 LAB — CBC WITH DIFFERENTIAL/PLATELET
BASO%: 1.3 % (ref 0.0–2.0)
Basophils Absolute: 0.1 10*3/uL (ref 0.0–0.1)
EOS%: 4.3 % (ref 0.0–7.0)
Eosinophils Absolute: 0.3 10*3/uL (ref 0.0–0.5)
HCT: 42.2 % (ref 38.4–49.9)
HGB: 13.9 g/dL (ref 13.0–17.1)
LYMPH%: 9.2 % — AB (ref 14.0–49.0)
MCH: 29 pg (ref 27.2–33.4)
MCHC: 33 g/dL (ref 32.0–36.0)
MCV: 87.8 fL (ref 79.3–98.0)
MONO#: 0.4 10*3/uL (ref 0.1–0.9)
MONO%: 5.5 % (ref 0.0–14.0)
NEUT%: 79.7 % — ABNORMAL HIGH (ref 39.0–75.0)
NEUTROS ABS: 6.4 10*3/uL (ref 1.5–6.5)
PLATELETS: 294 10*3/uL (ref 140–400)
RBC: 4.81 10*6/uL (ref 4.20–5.82)
RDW: 14.2 % (ref 11.0–14.6)
WBC: 8 10*3/uL (ref 4.0–10.3)
lymph#: 0.7 10*3/uL — ABNORMAL LOW (ref 0.9–3.3)

## 2017-01-07 LAB — COMPREHENSIVE METABOLIC PANEL
ALT: 16 U/L (ref 0–55)
AST: 22 U/L (ref 5–34)
Albumin: 2.7 g/dL — ABNORMAL LOW (ref 3.5–5.0)
Alkaline Phosphatase: 73 U/L (ref 40–150)
Anion Gap: 8 mEq/L (ref 3–11)
BILIRUBIN TOTAL: 0.29 mg/dL (ref 0.20–1.20)
BUN: 13.5 mg/dL (ref 7.0–26.0)
CO2: 22 meq/L (ref 22–29)
CREATININE: 0.9 mg/dL (ref 0.7–1.3)
Calcium: 8.9 mg/dL (ref 8.4–10.4)
Chloride: 108 mEq/L (ref 98–109)
EGFR: >90 mL/min/{1.73_m2} (ref >90)
GLUCOSE: 109 mg/dL (ref 70–140)
Potassium: 3.7 mEq/L (ref 3.5–5.1)
SODIUM: 139 meq/L (ref 136–145)
TOTAL PROTEIN: 5.4 g/dL — AB (ref 6.4–8.3)

## 2017-01-07 MED ORDER — DEXAMETHASONE SODIUM PHOSPHATE 10 MG/ML IJ SOLN
INTRAMUSCULAR | Status: AC
  Filled 2017-01-07: qty 1

## 2017-01-07 MED ORDER — DEXAMETHASONE SODIUM PHOSPHATE 10 MG/ML IJ SOLN
10.0000 mg | Freq: Once | INTRAMUSCULAR | Status: AC
  Administered 2017-01-07: 10 mg via INTRAVENOUS

## 2017-01-07 MED ORDER — DEXTROSE 5 % IV SOLN
Freq: Once | INTRAVENOUS | Status: AC
  Administered 2017-01-07: 12:00:00 via INTRAVENOUS

## 2017-01-07 MED ORDER — PALONOSETRON HCL INJECTION 0.25 MG/5ML
INTRAVENOUS | Status: AC
  Filled 2017-01-07: qty 5

## 2017-01-07 MED ORDER — OXALIPLATIN CHEMO INJECTION 100 MG/20ML
130.0000 mg/m2 | Freq: Once | INTRAVENOUS | Status: AC
  Administered 2017-01-07: 235 mg via INTRAVENOUS
  Filled 2017-01-07: qty 40

## 2017-01-07 MED ORDER — PALONOSETRON HCL INJECTION 0.25 MG/5ML
0.2500 mg | Freq: Once | INTRAVENOUS | Status: AC
  Administered 2017-01-07: 0.25 mg via INTRAVENOUS

## 2017-01-07 NOTE — Progress Notes (Signed)
1405: pt reports slight numbness in right arm, PIV remains intact and no s/s of infiltration. Warm blanket wrapped around arm which pt reports that helped. Pt tolerated infusion well. Discharge instructions printed and reviewed with pt, pt verbalizes understanding. Pt stable at discharge.

## 2017-01-07 NOTE — Patient Instructions (Addendum)
[facility] Discharge Instructions for Patients Receiving Chemotherapy  Today you received the following chemotherapy agents: Oxaliplatin   To help prevent nausea and vomiting after your treatment, we encourage you to take your nausea medication as directed.    If you develop nausea and vomiting that is not controlled by your nausea medication, call the clinic.   BELOW ARE SYMPTOMS THAT SHOULD BE REPORTED IMMEDIATELY:  *FEVER GREATER THAN 100.5 F  *CHILLS WITH OR WITHOUT FEVER  NAUSEA AND VOMITING THAT IS NOT CONTROLLED WITH YOUR NAUSEA MEDICATION  *UNUSUAL SHORTNESS OF BREATH  *UNUSUAL BRUISING OR BLEEDING  TENDERNESS IN MOUTH AND THROAT WITH OR WITHOUT PRESENCE OF ULCERS  *URINARY PROBLEMS  *BOWEL PROBLEMS  UNUSUAL RASH Items with * indicate a potential emergency and should be followed up as soon as possible.  Feel free to call the clinic you have any questions or concerns. The clinic phone number is [phone].  Please show the [facility] at check-in to the Emergency Department and triage nurse.   Oxaliplatin Injection What is this medicine? OXALIPLATIN (ox AL i PLA tin) is a chemotherapy drug. It targets fast dividing cells, like cancer cells, and causes these cells to die. This medicine is used to treat cancers of the colon and rectum, and many other cancers. This medicine may be used for other purposes; ask your health care provider or pharmacist if you have questions. COMMON BRAND NAME(S): Eloxatin What should I tell my health care provider before I take this medicine? They need to know if you have any of these conditions: -kidney disease -an unusual or allergic reaction to oxaliplatin, other chemotherapy, other medicines, foods, dyes, or preservatives -pregnant or trying to get pregnant -breast-feeding How should I use this medicine? This drug is given as an infusion into a vein. It is administered in a hospital or clinic by a specially  trained health care professional. Talk to your pediatrician regarding the use of this medicine in children. Special care may be needed. Overdosage: If you think you have taken too much of this medicine contact a poison control center or emergency room at once. NOTE: This medicine is only for you. Do not share this medicine with others. What if I miss a dose? It is important not to miss a dose. Call your doctor or health care professional if you are unable to keep an appointment. What may interact with this medicine? -medicines to increase blood counts like filgrastim, pegfilgrastim, sargramostim -probenecid -some antibiotics like amikacin, gentamicin, neomycin, polymyxin B, streptomycin, tobramycin -zalcitabine Talk to your doctor or health care professional before taking any of these medicines: -acetaminophen -aspirin -ibuprofen -ketoprofen -naproxen This list may not describe all possible interactions. Give your health care provider a list of all the medicines, herbs, non-prescription drugs, or dietary supplements you use. Also tell them if you smoke, drink alcohol, or use illegal drugs. Some items may interact with your medicine. What should I watch for while using this medicine? Your condition will be monitored carefully while you are receiving this medicine. You will need important blood work done while you are taking this medicine. This medicine can make you more sensitive to cold. Do not drink cold drinks or use ice. Cover exposed skin before coming in contact with cold temperatures or cold objects. When out in cold weather wear warm clothing and cover your mouth and nose to warm the air that goes into your lungs. Tell your doctor if you get sensitive to the cold. This  drug may make you feel generally unwell. This is not uncommon, as chemotherapy can affect healthy cells as well as cancer cells. Report any side effects. Continue your course of treatment even though you feel ill unless  your doctor tells you to stop. In some cases, you may be given additional medicines to help with side effects. Follow all directions for their use. Call your doctor or health care professional for advice if you get a fever, chills or sore throat, or other symptoms of a cold or flu. Do not treat yourself. This drug decreases your body's ability to fight infections. Try to avoid being around people who are sick. This medicine may increase your risk to bruise or bleed. Call your doctor or health care professional if you notice any unusual bleeding. Be careful brushing and flossing your teeth or using a toothpick because you may get an infection or bleed more easily. If you have any dental work done, tell your dentist you are receiving this medicine. Avoid taking products that contain aspirin, acetaminophen, ibuprofen, naproxen, or ketoprofen unless instructed by your doctor. These medicines may hide a fever. Do not become pregnant while taking this medicine. Women should inform their doctor if they wish to become pregnant or think they might be pregnant. There is a potential for serious side effects to an unborn child. Talk to your health care professional or pharmacist for more information. Do not breast-feed an infant while taking this medicine. Call your doctor or health care professional if you get diarrhea. Do not treat yourself. What side effects may I notice from receiving this medicine? Side effects that you should report to your doctor or health care professional as soon as possible: -allergic reactions like skin rash, itching or hives, swelling of the face, lips, or tongue -low blood counts - This drug may decrease the number of white blood cells, red blood cells and platelets. You may be at increased risk for infections and bleeding. -signs of infection - fever or chills, cough, sore throat, pain or difficulty passing urine -signs of decreased platelets or bleeding - bruising, pinpoint red spots  on the skin, black, tarry stools, nosebleeds -signs of decreased red blood cells - unusually weak or tired, fainting spells, lightheadedness -breathing problems -chest pain, pressure -cough -diarrhea -jaw tightness -mouth sores -nausea and vomiting -pain, swelling, redness or irritation at the injection site -pain, tingling, numbness in the hands or feet -problems with balance, talking, walking -redness, blistering, peeling or loosening of the skin, including inside the mouth -trouble passing urine or change in the amount of urine Side effects that usually do not require medical attention (report to your doctor or health care professional if they continue or are bothersome): -changes in vision -constipation -hair loss -loss of appetite -metallic taste in the mouth or changes in taste -stomach pain This list may not describe all possible side effects. Call your doctor for medical advice about side effects. You may report side effects to FDA at 1-800-FDA-1088. Where should I keep my medicine? This drug is given in a hospital or clinic and will not be stored at home. NOTE: This sheet is a summary. It may not cover all possible information. If you have questions about this medicine, talk to your doctor, pharmacist, or health care provider.  2017 [employer] (2008-07-02 17:22:47)

## 2017-01-10 NOTE — Progress Notes (Signed)
[place]  Telephone:([phone]) [phone] Fax:(336) [phone]  Clinic follow Up Note   Patient Care Team: [name] as PCP - General (General Practice) Tania Ade, RN as Registered Nurse Michael Boston, MD as Consulting Physician (General Surgery) Doran Stabler, MD as Consulting Physician (Gastroenterology) Kyung Rudd, MD as Consulting Physician (Radiation Oncology) Truitt Merle, MD as Consulting Physician (Oncology) 01/11/2017  CHIEF COMPLAINTS:  Follow up rectal cancer  Oncology History   Rectal adenocarcinoma [facility])   Staging form: Colon and Rectum, AJCC 7th Edition   - Clinical stage from 07/28/2016: Stage IIIB (T3, N2a, M0) - Signed by Truitt Merle, MD on 08/06/2016      Rectal adenocarcinoma s/p protctocolectomy, IPAA "J" pouch 12/01/2016   07/26/2016 Imaging    CT chest, abdomen and pelvis with contrast showed nodular thickening of the rectal wall, enlarged perirectal lymph nodes. Diverticulosis. Nonspecific low-level lymphadenopathy. Lower lobe emphysema, small 3-4 mm left lower lobe lung nodules.      07/28/2016 Initial Diagnosis    Rectal adenocarcinoma ([place])      07/28/2016 Initial Biopsy    Rectal mass biopsy showed invasive adenocarcinoma, polyps in the ascending colon and rectum showed tubular adenoma       07/28/2016 Procedure    Colonoscopy showed a nearly circumferential mass in the distal rectum, 4 polyps in the descending colon, diverticulosis in the left colon.       07/29/2016 Procedure    Low EUS showed clearly malignant 6 cm long, nearly circumferential mass in the distal rectum, with distal edge located to 1 cm from the annual approach, multiple prior rectal lymph nodes (6) are suspicious ,uT3 N2       08/17/2016 -  Radiation Therapy    Neoadjuvant radiation to his rectal cancer      08/17/2016 -  Chemotherapy    Xeloda 1500 mg twice daily with concurrent irradiation      08/18/2016 Imaging    PET scan showed hypermetabolic rectal  mass, perirectal node 39m with mild increased uptake, no hypermetabolic distant metastasis, small pulmonary nodules are too small to characterize by PET.       12/01/2016 Surgery    Colon total resection and proctocolectomy for rectal cancer and Lynch syndrome       12/01/2016 Pathology Results    Proctocolectomy showed scattered small residual foci of invasive moderately differentiated adenocarcinoma embedded in submucosa tissue with extensive neoadjuvant effect, 2.5 cm in greatest time mention, adenocarcinoma invades through muscularis mucosa and involves subserosal soft tissue, margins are negative, 2 of 35 lymph nodes are positive for metastatic adenocarcinoma, 2 calcified and necrotic soft tissue nodules are also present without visible tumor seen.      01/07/2017 -  Chemotherapy    Adjuvant chemo CAPOX, changed to FOLFOX from cycle 2 due to poor tolerance         HISTORY OF PRESENTING ILLNESS:  Tyrone Landry89y.o. male is here because of His newly diagnosed rectal cancer. He is accompanied by his girlfriend to our multidisciplinary GI clinic today.  He has had rectal pain for 6 weeks, and mild rectal bleeding with BM, he goes 4 times a day, very little each time, and pencil shaped stool, he has lost 30 lbs so far. His appettie remains to be good, he eats well, no cough, nausea, no abd pain. He has a moderate fatigue, he will 2 tolerate his routine activities including his job without much difficulty. He does not have  a primary care physician, and has not seen doctors for many years.  He presents to emergency room on 07/26/2016 for the rectal pain and bleeding, and was referred to gastroenterologist Dr. Loletha Carrow. He underwent colonoscopy on 07/28/2016, which showed 4 polyps in the ascending colon, and a circumferential distal rectal mass. Biopsy of the rectal mass showed adenocarcinoma. He underwent EUS by Dr. Ardis Hughs on 07/29/2016 which showed a T3 N2 disease.   CURRENT THERAPY: adjuvant  chemo CAPOX, will change to FOLFOX from cycle 2   INTERIM HISTORY  [name] for follow-up after cycle 1 of his chemo. He said the first cycle of chemo was rough. He said it hurt at the injection site the whole time. He was numb in the arm for a couple of days, and then it resolved. He would like a port. He experienced some nausea, vomiting, and diarrhea. He has been eating well, but has lost 6 pounds in 2 weeks. He hasn't been drinking any Ensure, but he has been eating a lot of peanut butter. He has headaches and pain from his ostomy bag rubbing on his abdomen. He would like more pain medication. For the last couple of days, he has had a lot of watery stool in his ostomy bag, and has had to change it 6-7 times a day. He has lost about 6 pounds in the past week. Denies any other concerns.   MEDICAL HISTORY:  Past Medical History:  Diagnosis Date  . Family history of colon cancer   . Poor dental hygiene        SURGICAL HISTORY: Past Surgical History:  Procedure Laterality Date  . EUS N/A 07/29/2016   Procedure: LOWER ENDOSCOPIC ULTRASOUND (EUS);  Surgeon: Milus Banister, MD;  Location: [facility];  Service: Endoscopy;  Laterality: N/A;  . PROCTOSCOPY N/A 12/01/2016   Procedure: RIGID PROCTOSCOPY;  Surgeon: Michael Boston, MD;  Location: [facility];  Service: General;  Laterality: N/A;  . TOE AMPUTATION Left   . XI ROBOTIC ASSISTED LOWER ANTERIOR RESECTION N/A 12/01/2016   Procedure: XI ROBOTIC ASSISTED PROCTOCOLECTOMY WITH ILLEOPOUCH ANASTAMOSIS WITH DIVERTING ILLEOSTOMY;  Surgeon: Michael Boston, MD;  Location: [facility];  Service: General;  Laterality: N/A;    SOCIAL HISTORY: Social History   Social History  . Marital status: Married    Spouse name: N/A  . Number of children: 3  . Years of education: N/A   Occupational History  . Not on file.   Social History Main Topics  . Smoking status: Current Every Day Smoker    Packs/day: 1.50    Years: 30.00    Types: Cigarettes  .  Smokeless tobacco: Never Used  . Alcohol use No  . Drug use: Yes    Types: Marijuana     Comment: patient denies  . Sexual activity: Not on file   Other Topics Concern  . Not on file   Social History Narrative   Lives with significant other, [name]   Have a 32 year old son   Smoker      [place] HISTORY: Family History  Problem Relation Age of Onset  . Diabetes Mother   . COPD Father   . Colon cancer Maternal Aunt     dx in her 22s  . Diabetes Maternal Grandmother   . Brain cancer Maternal Grandfather   . Lung cancer Paternal Grandfather   . Colon cancer Cousin     dx in her 45s  .  Bone cancer Sister 8  . Pancreatic cancer Neg Hx   . Esophageal cancer Neg Hx   . Stomach cancer Neg Hx   . Liver disease Neg Hx    He lives with his girfriend the their 75 yo son, works for [employer]   ALLERGIES:  has No Known Allergies.  MEDICATIONS:  Current Outpatient Prescriptions  Medication Sig Dispense Refill  . ALPRAZolam (XANAX) 0.5 MG tablet Take 0.5 tablets (0.25 mg total) by mouth at bedtime as needed for anxiety. For anxiety 20 tablet 0  . capecitabine (XELODA) 500 MG tablet Take 4 tab in morning, and 3 tab in evening, on days 1-14 of chemotherapy. 98 tablet 5  . diphenoxylate-atropine (LOMOTIL) 2.5-0.025 MG tablet Take 1-2 tablets by mouth 4 (four) times daily as needed for diarrhea or loose stools. 60 tablet 1  . naproxen sodium (ALEVE) 220 MG tablet Take 440 mg by mouth 2 (two) times daily as needed (for pain.).    Marland Kitchen ondansetron (ZOFRAN) 8 MG tablet Take 1 tablet (8 mg total) by mouth 2 (two) times daily as needed for refractory nausea / vomiting. Start on day 3 after chemotherapy. 30 tablet 1  . oxyCODONE (OXY IR/ROXICODONE) 5 MG immediate release tablet Take 1-2 tablets (5-10 mg total) by mouth every 6 (six) hours as needed for severe pain. 60 tablet 0  . polyethylene glycol (MIRALAX / GLYCOLAX) packet Take 17 g by mouth every other day.      . prochlorperazine (COMPAZINE) 10 MG tablet Take 1 tablet (10 mg total) by mouth every 6 (six) hours as needed for nausea or vomiting. 30 tablet 2  . prochlorperazine (COMPAZINE) 10 MG tablet Take 1 tablet (10 mg total) by mouth every 6 (six) hours as needed (Nausea or vomiting). 30 tablet 1  . traMADol (ULTRAM) 50 MG tablet Take 1-2 tablets (50-100 mg total) by mouth every 6 (six) hours as needed for moderate pain or severe pain (for pain.). 30 tablet 0   Current Facility-Administered Medications  Medication Dose Route Frequency Provider Last Rate Last Dose  . 0.9 %  sodium chloride infusion  500 mL Intravenous Continuous Nelida Meuse III, MD        REVIEW OF SYSTEMS: Constitutional: Denies fevers, chills or abnormal night sweats (+) weight loss Eyes: Denies blurriness of vision, double vision or watery eyes Ears, nose, mouth, throat, and face: Denies mucositis or sore throat Respiratory: Denies cough, dyspnea or wheezes Cardiovascular: Denies palpitation, chest discomfort or lower extremity swelling Gastrointestinal:  Denies heartburn or change in bowel habits (+) nausea, vomiting, diarrhea Skin: Denies abnormal skin rashes Lymphatics: Denies new lymphadenopathy or easy bruising Neurological:Denies numbness, tingling or new weaknesses (+) headaches Behavioral/Psych: Mood is stable, no new changes  All other systems were reviewed with the patient and are negative.  PHYSICAL EXAMINATION: ECOG PERFORMANCE STATUS: 1  Vitals:   01/11/17 1356  BP: 111/68  Pulse: 89  Resp: 16  Temp: 97.9 F (36.6 C)   Filed Weights   01/11/17 1356  Weight: 139 lb 4.8 oz (63.2 kg)   GENERAL:alert, no distress and comfortable SKIN: skin color, texture, turgor are normal, no rashes or significant lesions EYES: normal, conjunctiva are pink and non-injected, sclera clear OROPHARYNX:no exudate, no erythema and lips, buccal mucosa, and tongue normal  NECK: supple, thyroid normal size, non-tender,  without nodularity LYMPH:  no palpable lymphadenopathy in the cervical, axillary or inguinal LUNGS: clear to auscultation and percussion with normal breathing effort HEART: regular rate & rhythm and  no murmurs and no lower extremity edema ABDOMEN:abdomen soft, non-tender and normal bowel sounds. (+) Ileostomy back in the right abdomen, surgical incision sites has healed well.  Musculoskeletal:no cyanosis of digits and no clubbing  PSYCH: alert & oriented x 3 with fluent speech NEURO: no focal motor/sensory deficits  LABORATORY DATA:  I have reviewed the data as listed CBC Latest Ref Rng & Units 01/11/2017 01/07/2017 12/28/2016  WBC 4.0 - 10.3 10e3/uL 5.1 8.0 6.1  Hemoglobin 13.0 - 17.1 g/dL 14.6 13.9 13.4  Hematocrit 38.4 - 49.9 % 44.1 42.2 40.3  Platelets 140 - 400 10e3/uL 311 294 361   CMP Latest Ref Rng & Units 01/11/2017 01/07/2017 12/28/2016  Glucose 70 - 140 mg/dl 87 109 120  BUN 7.0 - 26.0 mg/dL 13.8 13.5 10.2  Creatinine 0.7 - 1.3 mg/dL 0.9 0.9 0.8  Sodium 136 - 145 mEq/L 138 139 139  Potassium 3.5 - 5.1 mEq/L 4.4 3.7 4.5  Chloride 101 - 111 mmol/L - - -  CO2 22 - 29 mEq/L '22 22 27  '$ Calcium 8.4 - 10.4 mg/dL 9.2 8.9 8.9  Total Protein 6.4 - 8.3 g/dL 5.9(L) 5.4(L) 5.5(L)  Total Bilirubin 0.20 - 1.20 mg/dL 0.32 0.29 <0.22  Alkaline Phos 40 - 150 U/L 85 73 71  AST 5 - 34 U/L '24 22 23  '$ ALT 0 - 55 U/L '22 16 29   '$ Pathology report  Diagnosis 07/28/2016 1. Surgical [P], descending, polyps(2) -TUBULAR ADENOMAS. -NO HIGH GRADE DYSPLASIA OR MALIGNANCY IDENTIFIED. 2. Surgical [P], rectal mass -INVASIVE ADENOCARCINOMA. -SEE COMMENT. 3. Surgical [P], rectum, polyps(2) -TUBULAR ADENOMAS. -NO HIGH GRADE DYSPLASIA OR MALIGNANCY IDENTIFIED. Microscopic Comment 2. Internal departmental review obtained (Dr. Saralyn Pilar) with agreement. Results are phoned to [facility], nurse in the office of Dr. Loletha Carrow. (MEG 08/03/16)    Diagnosis 12/01/2016 1. Colon, total resection (incl lymph nodes),  proctocolectomy - SCATTERED SMALL RESIDUAL FOCI (EACH LESS THAN 0.3 CM) OF INVASIVE MODERATELY DIFFERENTIATED ADENOCARCINOMA EMBEDDED IN SUBMUCOSAL TISSUE WITH EXTENSIVE NEOADJUVANT EFFECT. - TUMOR IS PRESENT WITHIN A REGION MEASURING APPROXIMATELY 2.5 CM IN GREATEST DIMENSION. - ADENOCARCINOMA INVADES THROUGH MUSCULARIS MUCOSA AND INVOLVES SUBSEROSAL SOFT TISSUE. - MARGINS ARE NEGATIVE. - TWO OF THIRTY-FIVE LYMPH NODES ARE POSITIVE FOR METASTATIC ADENOCARCINOMA (2/35). - LYMPH NODES ALSO DEMONSTRATE VARIABLE DEGREES OF NEOADJUVANT EFFECT. - TWO CALCIFIED AND NECROTIC SOFT TISSUE NODULES ARE ALSO PRESENT WITHOUT VIABLE TUMOR SEEN. - SEE ONCOLOGY TEMPLATE. 2. Colon, resection margin (donut), distal ring final distal margin - BENIGN ANAL MUCOSA. - NO DYSPLASIA OR TUMOR SEEN. Microscopic Comment 1. COLON AND RECTUM (INCLUDING TRANS-ANAL RESECTION): Specimen: Colon and rectum with attached appendix. Procedure: Robotic assisted proctocolectomy. Tumor site: Rectum. Specimen integrity: Intact. Macroscopic intactness of mesorectum: Near complete: The mesorectum is near complete with coning at the distal margin. Macroscopic tumor perforation: No. Invasive tumor: Maximum size: There is a 2.5 cm area of ulcerated colon with neoadjuvant changes and scattered small (less than 0.3 cm) foci of tumor. Histologic type(s): Invasive adenocarcinoma. Histologic grade and differentiation: G2: moderately differentiated/low grade. Type of polyp in which invasive carcinoma arose: Precursor polyp is not identified. Microscopic extension of invasive tumor: Tumor invades through muscularis propria to involve subserosal soft tissues. 1 of 3 FINAL for [name], Tyrone T [phone]) Microscopic Comment(continued) Lymph-Vascular invasion: Lymph/vascular invasion is not identified; however, several lymph nodes are positive for metastatic tumor, see below. Peri-neural invasion: Not identified. Tumor deposit(s)  (discontinuous extramural extension): No viable tumor deposits are identified. Resection margins (transanal resection margins): Proximal margin: Negative. Distal  margin: Negative. Circumferential (radial) (posterior ascending, posterior descending; lateral and posterior mid-rectum; and entire lower 1/3 rectum): Negative. Mucosal margin: Negative. Distance closest margin (if all above margins negative): 0.5 cm (radial / circumferential soft tissue margin in distal rectum). Distance closest mucosal margin (if negative): Greater than 0.6 cm (based on amount of tissue within cassettes adjacent to distal stapled margin). Treatment effect (neo-adjuvant therapy): Prominent treatment effect is identified within the tumor and within the lymph nodes. Additional polyp(s): No additional polyps identified. Non-neoplastic findings: Diverticulosis. Lymph nodes: number examined 35; number positive: 2. Pathologic Staging: ypT3, ypN1b. Ancillary studies: As the patient is status post neoadjuvant therapy and there are only small residual foci of tumor, additional studies will not be performed unless otherwise requested.    RADIOGRAPHIC STUDIES: I have personally reviewed the radiological images as listed and agreed with the findings in the report. No results found. EUS 07/29/2016 -Clearly malignant 6cm long, nearly circumferential mass in the distal rectum with distal edge located 1cm from the anal verge. If this is rectal adenocarcinoma it is uT3N2a, Stage IIIa.  COLONOSCOPY 07/28/2016 - Rectal mass. - Four 4 mm polyps in the rectum and in the descending colon, removed with a cold snare. Resected and retrieved. - Diverticulosis in the left colon. - Likely malignant tumor in the distal rectum. Biopsied. Tattooed.  CT of the chest/abd/pelvis with contrast 11/29/2016 IMPRESSION: 1. Interval treatment changes within the perirectal fat with otherwise stable rectosigmoid colon wall thickening. 2. No  progressive adenopathy or distant metastases identified. 3. Stable bilateral pulmonary nodules. Short-term stability (for 4 months) is reassuring, although the nodules are too small to optimally characterize by previous PET-CT. Continued CT follow-up recommended.   ASSESSMENT & PLAN: 46 y.o. male without significant past medical history presented with rectal pain and bleeding.  1. 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 He also has a slightly prominent periaortic lymph node, about 8 mm. -I previously reviewed his PET CT scan findings with patient and his girl firend, the small lung nodules and periaortic lymph node are not hypermetabolic, no definitive evidence of metastasis on the PET scan. -He completed neoadjuvant chemoradiation, tolerated moderate well overall.  -I previously reviewed his surgical pathology findings in great details. He had computed surgical resection, but unfortunately still has significant residual disease including 2 positive lymph nodes. -I recommend him to have adjuvant chemotherapy to reduce his risk of recurrence. Due to the positive lymph nodes, I recommend FOLFOX or CAPOX for 4.5 months  -The goal of therapy is curative. However his long nodules are suspicious for metastatic disease, needs to be followed closely. If he does have lung metastasis, that his cancer is unlikely curable. -He did poorly with the first cycle CAPOX last week, with significant diarrhea, anorexia, and weight loss. He also had issue with the arm numbness where he received the oxaliplatin infusion. He would like to have a port placed  for chemotherapy. -I recommend him to change to FOLFOX from next cycle, I think he will tolerate it batter.  -continue supportive care with IVF and nutrition support   2. Diarrhea  -He has watery stool after total colectomy, his ileostomy output has significant increases  since he started chemotherapy -I recommend IV fluids 1 L today, and repeat later this week and next week -I encouraged him to continue using Imodium. I given him prescription of Lomotil for diarrhea  3. 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 -He has undergone total colectomy and proctocolectomy, his risk of secondary colon cancer is minimum -We previously discussed other cancer risks from age syndrome, I recommend him to have EGD every 3-5 years, annual urinalysis for GU cancer screening.   4. Anxiety  -He feels nervous and anxious since his cancer diagnosis, much improved after starting treatment  -Continue Xanax as needed, I refilled for him   5. Social issues -He is out of work, he wants to apply for Medicaid and disability  -He was seen by our Education officer, museum  6. Headache and pain at ileostomy bag -I encouraged him to use Tylenol for headaches.  -He states his pain is unbearable sometime. I refilled his oxycodone. 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  Plan -Follow up with dietitian -Give him IV fluids today, this Saturday and next week due to watery stool in ostomy bag.  -Put in a port before February 8. I have contacted Dr. Johney Maine.  -Refill pain medication.  -Start taking Imodium.  -Prescription for Lomotil.   -I will see him back in 2 weeks for labs, flush, follow up, and treatment.   All questions were answered. The patient knows to call the clinic with any problems, questions or  concerns. I spent 25 minutes counseling the patient face to face. The total time spent in the appointment was 35 minutes and more than 50% was on counseling.   This document serves as a record of services personally performed by Truitt Merle, MD. It was created on her behalf by Martinique Casey, a trained medical scribe. The creation of this record is based on the scribe's personal observations and the provider's statements to them. This document has been checked and approved by the attending provider.  I have reviewed the above documentation for accuracy and completeness, and I agree with the above information.       Truitt Merle, MD 01/11/2017

## 2017-01-11 ENCOUNTER — Other Ambulatory Visit (HOSPITAL_BASED_OUTPATIENT_CLINIC_OR_DEPARTMENT_OTHER): Payer: Medicaid Other

## 2017-01-11 ENCOUNTER — Ambulatory Visit (HOSPITAL_BASED_OUTPATIENT_CLINIC_OR_DEPARTMENT_OTHER): Payer: Medicaid Other

## 2017-01-11 ENCOUNTER — Telehealth: Payer: Self-pay | Admitting: Hematology

## 2017-01-11 ENCOUNTER — Ambulatory Visit (HOSPITAL_BASED_OUTPATIENT_CLINIC_OR_DEPARTMENT_OTHER): Payer: Medicaid Other | Admitting: Hematology

## 2017-01-11 ENCOUNTER — Encounter: Payer: Self-pay | Admitting: Hematology

## 2017-01-11 ENCOUNTER — Telehealth: Payer: Self-pay | Admitting: *Deleted

## 2017-01-11 VITALS — BP 111/68 | HR 89 | Temp 97.9°F | Resp 16 | Ht 71.0 in | Wt 139.3 lb

## 2017-01-11 DIAGNOSIS — R197 Diarrhea, unspecified: Secondary | ICD-10-CM

## 2017-01-11 DIAGNOSIS — Z1509 Genetic susceptibility to other malignant neoplasm: Secondary | ICD-10-CM | POA: Diagnosis not present

## 2017-01-11 DIAGNOSIS — C20 Malignant neoplasm of rectum: Secondary | ICD-10-CM | POA: Diagnosis not present

## 2017-01-11 DIAGNOSIS — R51 Headache: Secondary | ICD-10-CM | POA: Diagnosis not present

## 2017-01-11 DIAGNOSIS — R918 Other nonspecific abnormal finding of lung field: Secondary | ICD-10-CM | POA: Diagnosis not present

## 2017-01-11 DIAGNOSIS — F419 Anxiety disorder, unspecified: Secondary | ICD-10-CM | POA: Diagnosis not present

## 2017-01-11 LAB — COMPREHENSIVE METABOLIC PANEL
ALT: 22 U/L (ref 0–55)
ANION GAP: 7 meq/L (ref 3–11)
AST: 24 U/L (ref 5–34)
Albumin: 2.9 g/dL — ABNORMAL LOW (ref 3.5–5.0)
Alkaline Phosphatase: 85 U/L (ref 40–150)
BUN: 13.8 mg/dL (ref 7.0–26.0)
CALCIUM: 9.2 mg/dL (ref 8.4–10.4)
CHLORIDE: 109 meq/L (ref 98–109)
CO2: 22 meq/L (ref 22–29)
CREATININE: 0.9 mg/dL (ref 0.7–1.3)
EGFR: >90 mL/min/{1.73_m2} (ref >90)
Glucose: 87 mg/dl (ref 70–140)
POTASSIUM: 4.4 meq/L (ref 3.5–5.1)
Sodium: 138 mEq/L (ref 136–145)
Total Bilirubin: 0.32 mg/dL (ref 0.20–1.20)
Total Protein: 5.9 g/dL — ABNORMAL LOW (ref 6.4–8.3)

## 2017-01-11 LAB — CBC WITH DIFFERENTIAL/PLATELET
BASO%: 0.8 % (ref 0.0–2.0)
BASOS ABS: 0 10*3/uL (ref 0.0–0.1)
EOS%: 3.8 % (ref 0.0–7.0)
Eosinophils Absolute: 0.2 10*3/uL (ref 0.0–0.5)
HCT: 44.1 % (ref 38.4–49.9)
HGB: 14.6 g/dL (ref 13.0–17.1)
LYMPH%: 13.8 % — AB (ref 14.0–49.0)
MCH: 28.7 pg (ref 27.2–33.4)
MCHC: 33 g/dL (ref 32.0–36.0)
MCV: 87.1 fL (ref 79.3–98.0)
MONO#: 0.3 10*3/uL (ref 0.1–0.9)
MONO%: 6.4 % (ref 0.0–14.0)
NEUT#: 3.9 10*3/uL (ref 1.5–6.5)
NEUT%: 75.2 % — AB (ref 39.0–75.0)
PLATELETS: 311 10*3/uL (ref 140–400)
RBC: 5.07 10*6/uL (ref 4.20–5.82)
RDW: 14 % (ref 11.0–14.6)
WBC: 5.1 10*3/uL (ref 4.0–10.3)
lymph#: 0.7 10*3/uL — ABNORMAL LOW (ref 0.9–3.3)

## 2017-01-11 MED ORDER — DIPHENOXYLATE-ATROPINE 2.5-0.025 MG PO TABS: 1.0000 | ORAL_TABLET | Freq: Four times a day (QID) | ORAL | 1 refills | Status: DC | PRN

## 2017-01-11 MED ORDER — OXYCODONE HCL 5 MG PO TABS: 5.0000 mg | ORAL_TABLET | Freq: Four times a day (QID) | ORAL | 0 refills | Status: DC | PRN

## 2017-01-11 MED ORDER — SODIUM CHLORIDE 0.9 % IV SOLN
Freq: Once | INTRAVENOUS | Status: AC
  Administered 2017-01-11: 15:00:00 via INTRAVENOUS

## 2017-01-11 NOTE — Patient Instructions (Signed)
Dehydration, Adult Dehydration is a condition in which there is not enough fluid or water in the body. This happens when you lose more fluids than you take in. Important organs, such as the kidneys, brain, and heart, cannot function without a proper amount of fluids. Any loss of fluids from the body can lead to dehydration. Dehydration can range from mild to severe. This condition should be treated right away to prevent it from becoming severe. What are the causes? This condition may be caused by:  Vomiting.  Diarrhea.  Excessive sweating, such as from heat exposure or exercise.  Not drinking enough fluid, especially:  When ill.  While doing activity that requires a lot of energy.  Excessive urination.  Fever.  Infection.  Certain medicines, such as medicines that cause the body to lose excess fluid (diuretics).  Inability to access safe drinking water.  Reduced physical ability to get adequate water and food. What increases the risk? This condition is more likely to develop in people:  Who have a poorly controlled long-term (chronic) illness, such as diabetes, heart disease, or kidney disease.  Who are age 65 or older.  Who are disabled.  Who live in a place with high altitude.  Who play endurance sports. What are the signs or symptoms? Symptoms of mild dehydration may include:   Thirst.  Dry lips.  Slightly dry mouth.  Dry, warm skin.  Dizziness. Symptoms of moderate dehydration may include:   Very dry mouth.  Muscle cramps.  Dark urine. Urine may be the color of tea.  Decreased urine production.  Decreased tear production.  Heartbeat that is irregular or faster than normal (palpitations).  Headache.  Light-headedness, especially when you stand up from a sitting position.  Fainting (syncope). Symptoms of severe dehydration may include:   Changes in skin, such as:  Cold and clammy skin.  Blotchy (mottled) or pale skin.  Skin that does  not quickly return to normal after being lightly pinched and released (poor skin turgor).  Changes in body fluids, such as:  Extreme thirst.  No tear production.  Inability to sweat when body temperature is high, such as in hot weather.  Very little urine production.  Changes in vital signs, such as:  Weak pulse.  Pulse that is more than 100 beats a minute when sitting still.  Rapid breathing.  Low blood pressure.  Other changes, such as:  Sunken eyes.  Cold hands and feet.  Confusion.  Lack of energy (lethargy).  Difficulty waking up from sleep.  Short-term weight loss.  Unconsciousness. How is this diagnosed? This condition is diagnosed based on your symptoms and a physical exam. Blood and urine tests may be done to help confirm the diagnosis. How is this treated? Treatment for this condition depends on the severity. Mild or moderate dehydration can often be treated at home. Treatment should be started right away. Do not wait until dehydration becomes severe. Severe dehydration is an emergency and it needs to be treated in a hospital. Treatment for mild dehydration may include:   Drinking more fluids.  Replacing salts and minerals in your blood (electrolytes) that you may have lost. Treatment for moderate dehydration may include:   Drinking an oral rehydration solution (ORS). This is a drink that helps you replace fluids and electrolytes (rehydrate). It can be found at pharmacies and retail stores. Treatment for severe dehydration may include:   Receiving fluids through an IV tube.  Receiving an electrolyte solution through a feeding tube that is   passed through your nose and into your stomach (nasogastric tube, or NG tube).  Correcting any abnormalities in electrolytes.  Treating the underlying cause of dehydration. Follow these instructions at home:  If directed by your health care provider, drink an ORS:  Make an ORS by following instructions on the  package.  Start by drinking small amounts, about  cup (120 mL) every 5-10 minutes.  Slowly increase how much you drink until you have taken the amount recommended by your health care provider.  Drink enough clear fluid to keep your urine clear or pale yellow. If you were told to drink an ORS, finish the ORS first, then start slowly drinking other clear fluids. Drink fluids such as:  Water. Do not drink only water. Doing that can lead to having too little salt (sodium) in the body (hyponatremia).  Ice chips.  Fruit juice that you have added water to (diluted fruit juice).  Low-calorie sports drinks.  Avoid:  Alcohol.  Drinks that contain a lot of sugar. These include high-calorie sports drinks, fruit juice that is not diluted, and soda.  Caffeine.  Foods that are greasy or contain a lot of fat or sugar.  Take over-the-counter and prescription medicines only as told by your health care provider.  Do not take sodium tablets. This can lead to having too much sodium in the body (hypernatremia).  Eat foods that contain a healthy balance of electrolytes, such as bananas, oranges, potatoes, tomatoes, and spinach.  Keep all follow-up visits as told by your health care provider. This is important. Contact a health care provider if:  You have abdominal pain that:  Gets worse.  Stays in one area (localizes).  You have a rash.  You have a stiff neck.  You are more irritable than usual.  You are sleepier or more difficult to wake up than usual.  You feel weak or dizzy.  You feel very thirsty.  You have urinated only a small amount of very dark urine over 6-8 hours. Get help right away if:  You have symptoms of severe dehydration.  You cannot drink fluids without vomiting.  Your symptoms get worse with treatment.  You have a fever.  You have a severe headache.  You have vomiting or diarrhea that:  Gets worse.  Does not go away.  You have blood or green matter  (bile) in your vomit.  You have blood in your stool. This may cause stool to look black and tarry.  You have not urinated in 6-8 hours.  You faint.  Your heart rate while sitting still is over 100 beats a minute.  You have trouble breathing. This information is not intended to replace advice given to you by your health care provider. Make sure you discuss any questions you have with your health care provider. Document Released: 12/06/2005 Document Revised: 07/02/2016 Document Reviewed: 01/30/2016 Elsevier Interactive Patient Education  2017 [employer]  

## 2017-01-11 NOTE — Telephone Encounter (Signed)
Appointments scheduled per 1/23 LOS. Patient given AVS report and calendars with future scheduled appointments. °

## 2017-01-11 NOTE — Telephone Encounter (Signed)
Per 1/23 LOS and staff message I have scheduled appts and gave calendar

## 2017-01-12 ENCOUNTER — Ambulatory Visit: Payer: Self-pay | Admitting: Surgery

## 2017-01-12 NOTE — H&P (Signed)
[name] 12/21/2016 8:42 AM Location: [facility] Patient #: [id number] DOB: Feb 15, 1971 Married / Language: [name] / Race: White Male   History of Present Illness Tyrone Hector MD; 12/21/2016 10:29 AM) The patient is a 46 year old male who presents with colorectal cancer. Note for "Colorectal cancer": Patient returns status post robotically-assisted total abdominal proctocolectomy with ileoanal J-pouch anastomosis and diverting loop ileostomy 12/01/2016.  Patient comes today with his family. He has been getting IV fluid infusions via his PICC line 3 times a week. Went down from 2 L to 1 L every Monday Wednesday Friday. He comes today with his family. He's lost about 10 pounds unintentionally. He ran out of tramadol but didn't really needed any more. However, he's had some occasional pains on the left side of his abdomen the past couple days. No nausea or vomiting. Still some anal drainage but not too severe. Not wearing diapers. Appetite okay. No nausea or vomiting. He is emptying the bag about 4 times a day. He does not need any antidiarrheals. He's urinating fine. Not lightheaded or dizzy. He continues to stay away from any cigarettes as he did preop. Appetite down but coming back slowly. No fevers chills or sweats. Energy level down but trying to come back   12/01/2016  PATIENT: [name] 46 y.o. 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)  PROCEDURE:  XI ROBOTIC ASSISTED  PROCTOCOLECTOMY ILEAL "J" POUCH ANAL ANASTAMOSIS (IPAA) DIVERTING LOOP ILEOSTOMY RIGID PROCTOSCOPY  SURGEON: Tyrone Hector, MD, FACS, MASCRS  ASSISTANT: Leighton Ruff, MD, FACS, FASCRS  ANESTHESIA: General, Local anesthetic as a field block and anorectal and perianal field block}  EBL: Total I/O In: 4250 [I.V.:4000; IV Piggyback:250] Out: 700 [Urine:500; Blood:200] "see anesthesia record"  Delay start of Pharmacological VTE agent (>24hrs) due to surgical blood loss or risk of bleeding: no  DRAINS: 19 Fr Blake drain in the pelvis  SPECIMEN: Source of Specimen: 1. COLON AND RECTUM 2. DISTAL ANASTOMOTIC RING  DISPOSITION OF SPECIMEN: PATHOLOGY  COUNTS: YES  PLAN OF CARE: Admit to inpatient  PATIENT DISPOSITION: PACU - hemodynamically stable.  INDICATION:   Patient with bowel changes and found to have very distal bulky rectal cancer. Genetic studies showed Lynch syndrome. Recommend patient made for low anterior resection. 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 Offered ileal pouch anal anastomosis with diverting loop ileostomy. Possible permanent ileostomy discussed.   The anatomy & physiology of the digestive tract was discussed. The pathophysiology was discussed. Natural history risks without surgery was discussed. I worked to give an overview of the disease and the frequent need to have multispecialty involvement. I feel the risks of no intervention will lead to serious problems that outweigh the operative risks; therefore, I recommended a partial colectomy to remove the pathology. Laparoscopic & open techniques were discussed.   Risks such as bleeding, infection, abscess, leak, reoperation, possible ostomy, hernia, impotence, incontinence, heart attack, death, and other risks were discussed. I noted a good likelihood this will help address the problem. Goals of post-operative recovery were discussed as well.  We will work to minimize  complications. Educational materials on the pathology had been given in the office. Questions were answered.   The patient expressed understanding & wished to proceed with surgery.  OR FINDINGS:  Patient had a bulky scarring consistent with rectal cancer involving the distal half of the rectum. Scar 3-4cm from the anal verge.  No obvious metastatic disease on visceral parietal peritoneum or liver.  He has a ileal J-pouch. 15 cm long. 25 EEA stapled to the anal canal. Ring at just above the sphincters. Small anterior leak. The anastomosis rests 0 cm from the anal verge by rigid proctoscopy.  Diverting loop ileostomy.    PATHOLOGY: FINAL DIAGNOSIS Diagnosis 1. Colon, total resection (incl lymph nodes), proctocolectomy - SCATTERED SMALL RESIDUAL FOCI (EACH LESS THAN 0.3 CM) OF INVASIVE MODERATELY DIFFERENTIATED ADENOCARCINOMA EMBEDDED IN SUBMUCOSAL TISSUE WITH EXTENSIVE NEOADJUVANT EFFECT. - TUMOR IS PRESENT WITHIN A REGION MEASURING APPROXIMATELY 2.5 CM IN GREATEST DIMENSION. - ADENOCARCINOMA INVADES THROUGH MUSCULARIS MUCOSA AND INVOLVES SUBSEROSAL SOFT TISSUE. - MARGINS ARE NEGATIVE. - TWO OF THIRTY-FIVE LYMPH NODES ARE POSITIVE FOR METASTATIC ADENOCARCINOMA (2/35). - LYMPH NODES ALSO DEMONSTRATE VARIABLE DEGREES OF NEOADJUVANT EFFECT. - TWO CALCIFIED AND NECROTIC SOFT TISSUE NODULES ARE ALSO PRESENT WITHOUT VIABLE TUMOR SEEN. - SEE ONCOLOGY TEMPLATE. 2. Colon, resection margin (donut), distal ring final distal margin - BENIGN ANAL MUCOSA. - NO DYSPLASIA OR TUMOR SEEN. Microscopic Comment 1. COLON AND RECTUM (INCLUDING TRANS-ANAL RESECTION): Specimen: Colon and rectum with attached appendix. Procedure: Robotic assisted proctocolectomy. Tumor site: Rectum. Specimen integrity: Intact. Macroscopic intactness of mesorectum: Near complete: The mesorectum is near complete with coning at the distal margin. Macroscopic tumor perforation: No. Invasive tumor: Maximum size:  There is a 2.5 cm area of ulcerated colon with neoadjuvant changes and scattered small (less than 0.3 cm) foci of tumor. Histologic type(s): Invasive adenocarcinoma. Histologic grade and differentiation: G2: moderately differentiated/low grade. Type of polyp in which invasive carcinoma arose: Precursor polyp is not identified. Microscopic extension of invasive tumor: Tumor invades through muscularis propria to involve subserosal soft tissues. 1 of 3 FINAL for Werst, Jaaziah T [phone]) Microscopic Comment(continued) Lymph-Vascular invasion: Lymph/vascular invasion is not identified; however, several lymph nodes are positive for metastatic tumor, see below. Peri-neural invasion: Not identified. Tumor deposit(s) (discontinuous extramural extension): No viable tumor deposits are identified. Resection margins (transanal resection margins): Proximal margin: Negative. Distal margin: Negative. Circumferential (radial) (posterior ascending, posterior descending; lateral and posterior mid-rectum; and entire lower 1/3 rectum): Negative. Mucosal margin: Negative. Distance closest margin (if all above margins negative): 0.5 cm (radial / circumferential soft tissue margin in distal rectum). Distance closest mucosal margin (if negative): Greater than 0.6 cm (based on amount of tissue within cassettes adjacent to distal stapled margin). Treatment effect (neo-adjuvant therapy): Prominent treatment effect is identified within the tumor and within the lymph nodes. Additional polyp(s): No additional polyps identified. Non-neoplastic findings: Diverticulosis. Lymph nodes: number examined 35; number positive: 2. Pathologic Staging: ypT3, ypN1b. Ancillary studies: As the patient is status post neoadjuvant therapy and there are only small residual foci of tumor, additional studies will not be performed unless otherwise requested. (RH:ecj 12/06/2016) Willeen Niece MD Pathologist, Electronic  Signature (Case signed 12/06/2016) Specimen Mckynzie Liwanag and Clinical Information Specimen(s) Obtained: 1. Colon, total resection (incl lymph nodes), proctocolectomy 2. Colon, resection margin (donut), distal ring final distal margin Specimen Clinical Information 1. low rectal cancer (kp) Jory Tanguma 1. Specimen: Clinically proctocolectomy, which includes portion of terminal ileum, and attached appendix. There is at least portion distal 1/3  of rectum. Specimen integrity: Intact Specimen length: Portion of terminal ileum is 4 cm and the colon is 152 cm from cecal to distal margin Mesorectal intactness: The mesorectum is near complete, with coning at the distal margin. There is muscle visible around the distal 3 cm. This area (mesorectal envelop) is inked black. Tumor location: Distal 1/3 of rectum Tumor size: Along predominantly the posterior wall to left lateral wall is a 1.5 cm in length and 2.5 cm in width, and up to 0.5 cm deep cavitary scarred area of tan red granular flattened mucosa. Residual lesion is not identified. Percent of bowel circumference involved: Approximately 25 to 30% Tumor distance to margins: Proximal: 154.5 cm Distal: The left lateral edge of the scar is focally involved with the stapled distal margin. 2 of 3 FINAL for Horsford, Ezrah T [phone]) Quashon Jesus(continued) Radial (posterior ascending, posterior descending; lateral and posterior mid-rectum; and entire lower 1/3 rectum): The scar is 0.8 cm from the inked perirectal soft tissue margin. Macroscopic extent of tumor invasion: There is no Layonna Dobie lesion to evaluate invasion. Within the wall beneath the scar is gray white indurated tissue, which abuts the inked perirectal soft tissue margin. Total presumed lymph nodes: Found are forty possible lymph nodes ranging from 0.3 to 1.2 cm. Extramural satellite tumor nodules: None Mucosal polyp(s): None Additional findings: There are several unperforated diverticuli within the sigmoid.  The appendix is 6.2 cm in length, averages 0.6 cm in diameter, has a smooth pink serosa and unremarkable cut surfaces. Block summary: A = proximal margin B, C = sections adjacent to stapled distal margin D-H = area of scar, full thickness I, J = scar, inner half sections K = diverticulum L = appendix. M = four nodes, whole N = four nodes, whole O = four nodes, whole P = five node, whole Q = four nodes, whole R = four nodes, whole S = four nodes, whole T = five nodes, whole U = five nodes, whole V = distal lymph node, bisected Twenty-two blocks. 2. Received in formalin is a 1 cm in length and up to 1 cm in diameter tubular portion of tissue, unoriented, with one end having possible light brown skin. On opening, there is tan pink smooth mucosa. The specimen is sectioned lengthwise and entirely submitted in two blocks. (SSW:kh 12-04-16) Report signed out from the following location(s) Technical component and interpretation was performed at [facility], [place], [place] [address]. CLIA #: [id number], 3 of 3   Medication History (Alisha Spillers, CMA; 12/21/2016 8:42 AM) Medications Reconciled  Vitals (Alisha Spillers CMA; 12/21/2016 8:42 AM) 12/21/2016 8:42 AM Weight: 141 lb Pulse: 104 (Regular)  BP: 110/64 (Sitting, Left Arm, Standard)       Physical Exam Tyrone Hector MD; 12/21/2016 10:28 AM) General Mental Status-Alert. General Appearance-Not in acute distress. Voice-Normal. Note: Tired but not toxic.   Integumentary Global Assessment Upon inspection and palpation of skin surfaces of the - Distribution of scalp and body hair is normal. General Characteristics Overall examination of the patient's skin reveals - no rashes and no suspicious lesions.  Head and Neck Head-normocephalic, atraumatic with no lesions or palpable masses. Face Global Assessment - atraumatic, no absence of expression. Neck Global Assessment -  no abnormal movements, no decreased range of motion. Trachea-midline. Thyroid Gland Characteristics - non-tender.  Eye Eyeball - Left-Extraocular movements intact, No Nystagmus. Eyeball - Right-Extraocular movements intact, No Nystagmus. Upper Eyelid - Left-No Cyanotic. Upper Eyelid - Right-No Cyanotic.  Chest  and Lung Exam Inspection Accessory muscles - No use of accessory muscles in breathing.  Abdomen Note: Right lower quadrant ileostomy pink with thick dark green succus in bag. No leak or rash. Blake drain in left upper quadrant with thin serosanguineous fluid a little bit of coagulum. I removed. He tolerated well. Incisions with normal healing ridges. No cellulitis. No guarding/rebound tenderness   Peripheral Vascular Upper Extremity Inspection - Left - Not Gangrenous, No Petechiae. Right - Not Gangrenous, No Petechiae.  Neurologic Neurologic evaluation reveals -normal attention span and ability to concentrate, able to name objects and repeat phrases. Appropriate fund of knowledge and normal coordination.  Neuropsychiatric Mental status exam performed with findings of-able to articulate well with normal speech/language, rate, volume and coherence and no evidence of hallucinations, delusions, obsessions or homicidal/suicidal ideation. Orientation-oriented X3.  Musculoskeletal Global Assessment Gait and Station - normal gait and station. Note: Right upper inner arm PICC line in place. I removed. He tolerated well.   Lymphatic General Lymphatics Description - No Generalized lymphadenopathy.    Assessment & Plan Tyrone Hector MD; 12/21/2016 9:08 AM) S/P COLON RESECTION - 1st postop visit (Z90.49) Current Plans Follow up with [place] in the office in 3 WEEKS.  Call us sooner as needed.  Pt Education - Education: Pathology Report given to patient Pt Education - CCS Colectomy post-op instructions: discussed with patient and provided  information. Consider follow up colonoscopy by your gastroenterologist, depending on your diagnosis. Call your gastroenterologist for advice  If you had a colon or rectal cancer resected by surgery, you should strongly consider getting a colonoscopy by your gastroenterologict one year after the colon cancer was removed. If it was a benign polyp that was removed by colon resection, consider follow-up colonoscopy in about 3 years.  RECTAL ADENOCARCINOMA (C20) Impression: Rectal cancer 4cm from anal verge s/p chemoXRT. Status post robotic resection.  Pathology ypT3 ypN1 (2/35 lymph nodes). Discussed the GI tumor Board. He would benefit from post-adjuvant chemotherapy. A 3 months course would be appropriate is what I recall Dr. Burr Medico saying  Once he has completed that, plan evaluation of the ileoanal J-pouch. If that is clean, then Loop ileostomy takedown. Most likely 4-6 weeks after completing chemotherapy it to give his body chance to recover. Current Plans Started TraMADol HCl 50MG, 1-2 Tablet every six hours, as needed, #40, 12/21/2016, No Refill. 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 Status post completion proctocolectomy as prophylaxis against high likelihood of future colorectal cancers. Upper endoscopy and other protocols per gastroenterology and oncology - screening not as definite in those regions.  Individuals with Lynch syndrome should undergo screening for CRC and extracolonic cancers. We suggest the following approach (see 'Cancer screening' </contents/lynch-syndrome-hereditary-nonpolyposis-colorectal-cancer-screening-and-management?search=lynch%20syndrome%20screening&sectionRank=1&usage_type=default&anchor=[id number]&source=machineLearning&selectedTitle=1~92&display_rank=1> above): .Annual colonoscopy starting between the ages of 79 and 41 years, or two to five years prior to the earliest age of CRC diagnosis in the family,  whichever comes first. In families with MSH6 or PMS2 mutations, screening can start at age 54 to 62 or two to five years prior to the earliest CRC in the family, unless an early-onset CRC has been diagnosed in a given family. .Annual screening for endometrial and ovarian cancer with pelvic examination, endometrial biopsy, and transvaginal ultrasound beginning at age 65 to 83 years, or three to five years earlier than the earliest age of diagnosis of these cancers in the family (whichever is earlier). We offer prophylactic hysterectomy and salpingo-oophorectomy in women with Lynch syndrome at the end of childbearing or  at age 38 years. Marland KitchenUpper endoscopy with biopsy of the gastric antrum starting at 30 to 35 years and treatment of Helicobacter pylori infection when found on biopsy. We perform a repeat upper endoscopy every two to three years in individuals with risk factors for gastric cancer. We carefully inspect the distal duodenum and terminal ileum for small intestinal cancers during upper endoscopy and colonoscopy, respectively. .Annual urinalysis examination beginning at age 43 to 33 years. .Annual physical examination including careful skin and neurological exami ILEOSTOMY IN PLACE (Z93.2) Impression: Doing relatively well with the loop ileostomy. Only for empties a day. No antidiarrheals.  I do not think he needs any IV fluids anymore. I removed his PICC line. Continue oral hydration as tolerated. Antidiarrheals as needed. Current Plans Pt Education - CCS Ostomy HCI (Tyrena Gohr): discussed with patient and provided information.   Addendum: Felt to benefit from chemotherapy.  Request made for Port-A-Cath placement.  We will try and get it done before start date of eight February.  [name], M.D., F.A.C.S. Gastrointestinal and Minimally Invasive Surgery [facility] [address], [place], [place] [address] [phone] Main / Paging

## 2017-01-14 ENCOUNTER — Other Ambulatory Visit: Payer: Self-pay | Admitting: *Deleted

## 2017-01-15 ENCOUNTER — Ambulatory Visit (HOSPITAL_BASED_OUTPATIENT_CLINIC_OR_DEPARTMENT_OTHER): Payer: Medicaid Other

## 2017-01-15 VITALS — BP 107/58 | HR 74 | Temp 97.6°F | Resp 17

## 2017-01-15 DIAGNOSIS — C20 Malignant neoplasm of rectum: Secondary | ICD-10-CM

## 2017-01-15 MED ORDER — SODIUM CHLORIDE 0.9 % IV SOLN
Freq: Once | INTRAVENOUS | Status: AC
  Administered 2017-01-15: 09:00:00 via INTRAVENOUS

## 2017-01-18 ENCOUNTER — Ambulatory Visit: Payer: Medicaid Other

## 2017-01-18 ENCOUNTER — Telehealth: Payer: Self-pay | Admitting: *Deleted

## 2017-01-18 NOTE — Telephone Encounter (Signed)
Family member called back and we schedueld appt for tomorrow morning

## 2017-01-18 NOTE — Telephone Encounter (Signed)
Received voice mail stating that "we have another appt the overlaps this one. Need to reschedule." I have called ans left a message to call the office to reschedule.

## 2017-01-19 ENCOUNTER — Ambulatory Visit (HOSPITAL_BASED_OUTPATIENT_CLINIC_OR_DEPARTMENT_OTHER): Payer: Medicaid Other | Admitting: Nurse Practitioner

## 2017-01-19 ENCOUNTER — Encounter: Payer: Self-pay | Admitting: Nurse Practitioner

## 2017-01-19 VITALS — BP 111/75 | HR 61 | Resp 17 | Ht 71.0 in | Wt 136.3 lb

## 2017-01-19 DIAGNOSIS — C20 Malignant neoplasm of rectum: Secondary | ICD-10-CM

## 2017-01-19 DIAGNOSIS — R197 Diarrhea, unspecified: Secondary | ICD-10-CM | POA: Diagnosis not present

## 2017-01-19 MED ORDER — SODIUM CHLORIDE 0.9 % IV SOLN
Freq: Once | INTRAVENOUS | Status: AC
  Administered 2017-01-19: 10:00:00 via INTRAVENOUS

## 2017-01-19 NOTE — Patient Instructions (Signed)
Dehydration, Adult Dehydration is a condition in which there is not enough fluid or water in the body. This happens when you lose more fluids than you take in. Important organs, such as the kidneys, brain, and heart, cannot function without a proper amount of fluids. Any loss of fluids from the body can lead to dehydration. Dehydration can range from mild to severe. This condition should be treated right away to prevent it from becoming severe. What are the causes? This condition may be caused by:  Vomiting.  Diarrhea.  Excessive sweating, such as from heat exposure or exercise.  Not drinking enough fluid, especially:  When ill.  While doing activity that requires a lot of energy.  Excessive urination.  Fever.  Infection.  Certain medicines, such as medicines that cause the body to lose excess fluid (diuretics).  Inability to access safe drinking water.  Reduced physical ability to get adequate water and food. What increases the risk? This condition is more likely to develop in people:  Who have a poorly controlled long-term (chronic) illness, such as diabetes, heart disease, or kidney disease.  Who are age 65 or older.  Who are disabled.  Who live in a place with high altitude.  Who play endurance sports. What are the signs or symptoms? Symptoms of mild dehydration may include:   Thirst.  Dry lips.  Slightly dry mouth.  Dry, warm skin.  Dizziness. Symptoms of moderate dehydration may include:   Very dry mouth.  Muscle cramps.  Dark urine. Urine may be the color of tea.  Decreased urine production.  Decreased tear production.  Heartbeat that is irregular or faster than normal (palpitations).  Headache.  Light-headedness, especially when you stand up from a sitting position.  Fainting (syncope). Symptoms of severe dehydration may include:   Changes in skin, such as:  Cold and clammy skin.  Blotchy (mottled) or pale skin.  Skin that does  not quickly return to normal after being lightly pinched and released (poor skin turgor).  Changes in body fluids, such as:  Extreme thirst.  No tear production.  Inability to sweat when body temperature is high, such as in hot weather.  Very little urine production.  Changes in vital signs, such as:  Weak pulse.  Pulse that is more than 100 beats a minute when sitting still.  Rapid breathing.  Low blood pressure.  Other changes, such as:  Sunken eyes.  Cold hands and feet.  Confusion.  Lack of energy (lethargy).  Difficulty waking up from sleep.  Short-term weight loss.  Unconsciousness. How is this diagnosed? This condition is diagnosed based on your symptoms and a physical exam. Blood and urine tests may be done to help confirm the diagnosis. How is this treated? Treatment for this condition depends on the severity. Mild or moderate dehydration can often be treated at home. Treatment should be started right away. Do not wait until dehydration becomes severe. Severe dehydration is an emergency and it needs to be treated in a hospital. Treatment for mild dehydration may include:   Drinking more fluids.  Replacing salts and minerals in your blood (electrolytes) that you may have lost. Treatment for moderate dehydration may include:   Drinking an oral rehydration solution (ORS). This is a drink that helps you replace fluids and electrolytes (rehydrate). It can be found at pharmacies and retail stores. Treatment for severe dehydration may include:   Receiving fluids through an IV tube.  Receiving an electrolyte solution through a feeding tube that is   passed through your nose and into your stomach (nasogastric tube, or NG tube).  Correcting any abnormalities in electrolytes.  Treating the underlying cause of dehydration. Follow these instructions at home:  If directed by your health care provider, drink an ORS:  Make an ORS by following instructions on the  package.  Start by drinking small amounts, about  cup (120 mL) every 5-10 minutes.  Slowly increase how much you drink until you have taken the amount recommended by your health care provider.  Drink enough clear fluid to keep your urine clear or pale yellow. If you were told to drink an ORS, finish the ORS first, then start slowly drinking other clear fluids. Drink fluids such as:  Water. Do not drink only water. Doing that can lead to having too little salt (sodium) in the body (hyponatremia).  Ice chips.  Fruit juice that you have added water to (diluted fruit juice).  Low-calorie sports drinks.  Avoid:  Alcohol.  Drinks that contain a lot of sugar. These include high-calorie sports drinks, fruit juice that is not diluted, and soda.  Caffeine.  Foods that are greasy or contain a lot of fat or sugar.  Take over-the-counter and prescription medicines only as told by your health care provider.  Do not take sodium tablets. This can lead to having too much sodium in the body (hypernatremia).  Eat foods that contain a healthy balance of electrolytes, such as bananas, oranges, potatoes, tomatoes, and spinach.  Keep all follow-up visits as told by your health care provider. This is important. Contact a health care provider if:  You have abdominal pain that:  Gets worse.  Stays in one area (localizes).  You have a rash.  You have a stiff neck.  You are more irritable than usual.  You are sleepier or more difficult to wake up than usual.  You feel weak or dizzy.  You feel very thirsty.  You have urinated only a small amount of very dark urine over 6-8 hours. Get help right away if:  You have symptoms of severe dehydration.  You cannot drink fluids without vomiting.  Your symptoms get worse with treatment.  You have a fever.  You have a severe headache.  You have vomiting or diarrhea that:  Gets worse.  Does not go away.  You have blood or green matter  (bile) in your vomit.  You have blood in your stool. This may cause stool to look black and tarry.  You have not urinated in 6-8 hours.  You faint.  Your heart rate while sitting still is over 100 beats a minute.  You have trouble breathing. This information is not intended to replace advice given to you by your health care provider. Make sure you discuss any questions you have with your health care provider. Document Released: 12/06/2005 Document Revised: 07/02/2016 Document Reviewed: 01/30/2016 Elsevier Interactive Patient Education  2017 [employer]  

## 2017-01-19 NOTE — Progress Notes (Signed)
RN visit only. 

## 2017-01-21 ENCOUNTER — Encounter (HOSPITAL_BASED_OUTPATIENT_CLINIC_OR_DEPARTMENT_OTHER): Payer: Self-pay | Admitting: *Deleted

## 2017-01-21 DIAGNOSIS — Z801 Family history of malignant neoplasm of trachea, bronchus and lung: Secondary | ICD-10-CM | POA: Diagnosis not present

## 2017-01-21 DIAGNOSIS — C20 Malignant neoplasm of rectum: Secondary | ICD-10-CM | POA: Diagnosis present

## 2017-01-21 DIAGNOSIS — T797XXA Traumatic subcutaneous emphysema, initial encounter: Secondary | ICD-10-CM | POA: Diagnosis not present

## 2017-01-21 DIAGNOSIS — L7681 Other intraoperative complications of skin and subcutaneous tissue: Secondary | ICD-10-CM | POA: Diagnosis not present

## 2017-01-21 DIAGNOSIS — Z825 Family history of asthma and other chronic lower respiratory diseases: Secondary | ICD-10-CM | POA: Diagnosis not present

## 2017-01-21 DIAGNOSIS — Z833 Family history of diabetes mellitus: Secondary | ICD-10-CM | POA: Diagnosis not present

## 2017-01-21 DIAGNOSIS — Z87891 Personal history of nicotine dependence: Secondary | ICD-10-CM | POA: Diagnosis not present

## 2017-01-21 DIAGNOSIS — Z980 Intestinal bypass and anastomosis status: Secondary | ICD-10-CM | POA: Diagnosis not present

## 2017-01-21 DIAGNOSIS — Z808 Family history of malignant neoplasm of other organs or systems: Secondary | ICD-10-CM | POA: Diagnosis not present

## 2017-01-21 DIAGNOSIS — Z1509 Genetic susceptibility to other malignant neoplasm: Secondary | ICD-10-CM | POA: Diagnosis not present

## 2017-01-21 DIAGNOSIS — Z79899 Other long term (current) drug therapy: Secondary | ICD-10-CM | POA: Diagnosis not present

## 2017-01-21 DIAGNOSIS — Y838 Other surgical procedures as the cause of abnormal reaction of the patient, or of later complication, without mention of misadventure at the time of the procedure: Secondary | ICD-10-CM | POA: Diagnosis not present

## 2017-01-21 NOTE — Progress Notes (Signed)
To [facility] at [place] after Mn solids /clear liquids only (Mn-0500) ,then Npo.Hibiclens shower evening prior and am of procedure.Current labs in epic.

## 2017-01-25 NOTE — Progress Notes (Signed)
[place]  Telephone:(336) [phone] [phone]) [phone]  Clinic follow Up Note   Patient Care Team: [name] as PCP - General (General Practice) Tania Ade, RN as Registered Nurse Michael Boston, MD as Consulting Physician (General Surgery) Doran Stabler, MD as Consulting Physician (Gastroenterology) Kyung Rudd, MD as Consulting Physician (Radiation Oncology) Truitt Merle, MD as Consulting Physician (Oncology) 01/27/2017  CHIEF COMPLAINTS:  Follow up rectal cancer  Oncology History   Rectal adenocarcinoma [facility])   Staging form: Colon and Rectum, AJCC 7th Edition   - Clinical stage from 07/28/2016: Stage IIIB (T3, N2a, M0) - Signed by Truitt Merle, MD on 08/06/2016      Rectal adenocarcinoma s/p protctocolectomy, IPAA "J" pouch 12/01/2016   07/26/2016 Imaging    CT chest, abdomen and pelvis with contrast showed nodular thickening of the rectal wall, enlarged perirectal lymph nodes. Diverticulosis. Nonspecific low-level lymphadenopathy. Lower lobe emphysema, small 3-4 mm left lower lobe lung nodules.      07/28/2016 Initial Diagnosis    Rectal adenocarcinoma ([place])      07/28/2016 Initial Biopsy    Rectal mass biopsy showed invasive adenocarcinoma, polyps in the ascending colon and rectum showed tubular adenoma       07/28/2016 Procedure    Colonoscopy showed a nearly circumferential mass in the distal rectum, 4 polyps in the descending colon, diverticulosis in the left colon.       07/29/2016 Procedure    Low EUS showed clearly malignant 6 cm long, nearly circumferential mass in the distal rectum, with distal edge located to 1 cm from the annual approach, multiple prior rectal lymph nodes (6) are suspicious ,uT3 N2       08/17/2016 -  Radiation Therapy    Neoadjuvant radiation to his rectal cancer      08/17/2016 -  Chemotherapy    Xeloda 1500 mg twice daily with concurrent irradiation      08/18/2016 Imaging    PET scan showed hypermetabolic rectal mass,  perirectal node 30m with mild increased uptake, no hypermetabolic distant metastasis, small pulmonary nodules are too small to characterize by PET.       12/01/2016 Surgery    Colon total resection and proctocolectomy for rectal cancer and Lynch syndrome       12/01/2016 Pathology Results    Proctocolectomy showed scattered small residual foci of invasive moderately differentiated adenocarcinoma embedded in submucosa tissue with extensive neoadjuvant effect, 2.5 cm in greatest time mention, adenocarcinoma invades through muscularis mucosa and involves subserosal soft tissue, margins are negative, 2 of 35 lymph nodes are positive for metastatic adenocarcinoma, 2 calcified and necrotic soft tissue nodules are also present without visible tumor seen.      01/07/2017 -  Chemotherapy    Adjuvant chemo CAPOX, changed to FOLFOX from cycle 2 due to poor tolerance         HISTORY OF PRESENTING ILLNESS:  [name]46y.o. male is here because of His newly diagnosed rectal cancer. He is accompanied by his girlfriend to our multidisciplinary GI clinic today.  He has had rectal pain for 6 weeks, and mild rectal bleeding with BM, he goes 4 times a day, very little each time, and pencil shaped stool, he has lost 30 lbs so far. His appettie remains to be good, he eats well, no cough, nausea, no abd pain. He has a moderate fatigue, he will 2 tolerate his routine activities including his job without much difficulty. He does not have  a primary care physician, and has not seen doctors for many years.  He presents to emergency room on 07/26/2016 for the rectal pain and bleeding, and was referred to gastroenterologist Dr. Loletha Carrow. He underwent colonoscopy on 07/28/2016, which showed 4 polyps in the ascending colon, and a circumferential distal rectal mass. Biopsy of the rectal mass showed adenocarcinoma. He underwent EUS by Dr. Ardis Hughs on 07/29/2016 which showed a T3 N2 disease.   CURRENT THERAPY: adjuvant chemo  CAPOX, will change to FOLFOX from cycle 2   INTERIM HISTORY  [name] returns for follow-up and 2nd cycle of chemo. His port was put in yesterday. He is a little sore, but is otherwise fine. He did well after his last chemo cycle. His stool hasn'[name] been as watery as last time, but it is still loose. He changes his bag 4-5 times a day. Before he started chemo, he was getting IV fluids every day at home. He has been taking the Lomotil and Imodium. He has gained some weight back and has been eating well. Denies numbness, tingling, or any other concerns.   MEDICAL HISTORY:  Past Medical History:  Diagnosis Date  . Cancer ([place]) 12/01/2016   colon   . Family history of colon cancer   . Poor dental hygiene        SURGICAL HISTORY: Past Surgical History:  Procedure Laterality Date  . EUS N/A 07/29/2016   Procedure: LOWER ENDOSCOPIC ULTRASOUND (EUS);  Surgeon: Milus Banister, MD;  Location: [facility];  Service: Endoscopy;  Laterality: N/A;  . PORTACATH PLACEMENT N/A 01/26/2017   Procedure: PLACEMENT OF PORT-A-CATH CENTRAL LINE WITH FLUOROSCOPY AND ULTRASOUND;  Surgeon: Michael Boston, MD;  Location: [facility];  Service: General;  Laterality: N/A;  . PROCTOSCOPY N/A 12/01/2016   Procedure: RIGID PROCTOSCOPY;  Surgeon: Michael Boston, MD;  Location: [facility];  Service: General;  Laterality: N/A;  . TOE AMPUTATION Left   . XI ROBOTIC ASSISTED LOWER ANTERIOR RESECTION N/A 12/01/2016   Procedure: XI ROBOTIC ASSISTED PROCTOCOLECTOMY WITH ILLEOPOUCH ANASTAMOSIS WITH DIVERTING ILLEOSTOMY;  Surgeon: Michael Boston, MD;  Location: [facility];  Service: General;  Laterality: N/A;    SOCIAL HISTORY: Social History   Social History  . Marital status: Married    Spouse name: N/A  . Number of children: 3  . Years of education: N/A   Occupational History  . Not on file.   Social History Main Topics  . Smoking status: Former Smoker    Packs/day: 1.50    Years: 30.00    Types: Cigarettes     Quit date: 12/01/2016  . Smokeless tobacco: Never Used  . Alcohol use No  . Drug use: No     Comment: patient denies  . Sexual activity: Not on file   Other Topics Concern  . Not on file   Social History Narrative   Lives with significant other, [name]   Have a 56 year old son   Smoker      [place] HISTORY: Family History  Problem Relation Age of Onset  . Diabetes Mother   . COPD Father   . Colon cancer Maternal Aunt     dx in her 38s  . Diabetes Maternal Grandmother   . Brain cancer Maternal Grandfather   . Lung cancer Paternal Grandfather   . Colon cancer Cousin     dx in her 60s  . Bone cancer Sister 8  . Pancreatic cancer Neg Hx   .  Esophageal cancer Neg Hx   . Stomach cancer Neg Hx   . Liver disease Neg Hx    He lives with his girfriend and their 31 yo son, works for [employer]   ALLERGIES:  has No Known Allergies.  MEDICATIONS:  Current Outpatient Prescriptions  Medication Sig Dispense Refill  . ALPRAZolam (XANAX) 0.5 MG tablet Take 0.5 tablets (0.25 mg total) by mouth at bedtime as needed for anxiety. For anxiety 20 tablet 0  . diphenoxylate-atropine (LOMOTIL) 2.5-0.025 MG tablet Take 1-2 tablets by mouth 4 (four) times daily as needed for diarrhea or loose stools. 60 tablet 1  . naproxen sodium (ALEVE) 220 MG tablet Take 440 mg by mouth 2 (two) times daily as needed (for pain.).    Marland Kitchen oxyCODONE (OXY IR/ROXICODONE) 5 MG immediate release tablet Take 1-2 tablets (5-10 mg total) by mouth every 6 (six) hours as needed for severe pain. 60 tablet 0  . traMADol (ULTRAM) 50 MG tablet Take 1-2 tablets (50-100 mg total) by mouth every 6 (six) hours as needed for moderate pain or severe pain (for pain.). 30 tablet 0  . polyethylene glycol (MIRALAX / GLYCOLAX) packet Take 17 g by mouth as needed.     . prochlorperazine (COMPAZINE) 10 MG tablet Take 1 tablet (10 mg total) by mouth every 6 (six) hours as needed for nausea or vomiting. (Patient  not taking: Reported on 01/27/2017) 30 tablet 2   Current Facility-Administered Medications  Medication Dose Route Frequency Provider Last Rate Last Dose  . 0.9 %  sodium chloride infusion  500 mL Intravenous Continuous Nelida Meuse III, MD        REVIEW OF SYSTEMS: Constitutional: Denies fevers, chills or abnormal night sweats (+) sore at port site.  Eyes: Denies blurriness of vision, double vision or watery eyes Ears, nose, mouth, throat, and face: Denies mucositis or sore throat Respiratory: Denies cough, dyspnea or wheezes Cardiovascular: Denies palpitation, chest discomfort or lower extremity swelling Gastrointestinal:  Denies heartburn or change in bowel habits (+) loose stool Skin: Denies abnormal skin rashes Lymphatics: Denies new lymphadenopathy or easy bruising Neurological:Denies numbness, tingling or new weaknesses  Behavioral/Psych: Mood is stable, no new changes  All other systems were reviewed with the patient and are negative.  PHYSICAL EXAMINATION: ECOG PERFORMANCE STATUS: 1  Vitals:   01/27/17 1127  BP: 95/60  Pulse: 65  Resp: 18   Filed Weights   01/27/17 1127  Weight: 145 lb 14.4 oz (66.2 kg)    GENERAL:alert, no distress and comfortable SKIN: skin color, texture, turgor are normal, no rashes or significant lesions EYES: normal, conjunctiva are pink and non-injected, sclera clear OROPHARYNX:no exudate, no erythema and lips, buccal mucosa, and tongue normal  NECK: supple, thyroid normal size, non-tender, without nodularity LYMPH:  no palpable lymphadenopathy in the cervical, axillary or inguinal LUNGS: clear to auscultation and percussion with normal breathing effort HEART: regular rate & rhythm and no murmurs and no lower extremity edema ABDOMEN:abdomen soft, non-tender and normal bowel sounds. (+) Ileostomy back in the right abdomen, surgical incision sites has healed well.  Musculoskeletal:no cyanosis of digits and no clubbing  PSYCH: alert & oriented x  3 with fluent speech NEURO: no focal motor/sensory deficits  LABORATORY DATA:  I have reviewed the data as listed CBC Latest Ref Rng & Units 01/27/2017 01/11/2017 01/07/2017  WBC 4.0 - 10.3 10e3/uL 4.6 5.1 8.0  Hemoglobin 13.0 - 17.1 g/dL 13.1 14.6 13.9  Hematocrit 38.4 - 49.9 % 38.5 44.1 42.2  Platelets  140 - 400 10e3/uL 182 311 294   CMP Latest Ref Rng & Units 01/27/2017 01/11/2017 01/07/2017  Glucose 70 - 140 mg/dl 116 87 109  BUN 7.0 - 26.0 mg/dL 6.2(L) 13.8 13.5  Creatinine 0.7 - 1.3 mg/dL 0.7 0.9 0.9  Sodium 136 - 145 mEq/L 141 138 139  Potassium 3.5 - 5.1 mEq/L 3.7 4.4 3.7  Chloride 101 - 111 mmol/L - - -  CO2 22 - 29 mEq/L _0 Calcium 8.4 - 10.4 mg/dL 8.3(L) 9.2 8.9  Total Protein 6.4 - 8.3 g/dL 4.7(L) 5.9(L) 5.4(L)  Total Bilirubin 0.20 - 1.20 mg/dL <0.22 0.32 0.29  Alkaline Phos 40 - 150 U/L 60 85 73  AST 5 - 34 U/L _1 ALT 0 - 55 U/L _2 Pathology report  Diagnosis 07/28/2016 1. Surgical [P], descending, polyps(2) -TUBULAR ADENOMAS. -NO HIGH GRADE DYSPLASIA OR MALIGNANCY IDENTIFIED. 2. Surgical [P], rectal mass -INVASIVE ADENOCARCINOMA. -SEE COMMENT. 3. Surgical [P], rectum, polyps(2) -TUBULAR ADENOMAS. -NO HIGH GRADE DYSPLASIA OR MALIGNANCY IDENTIFIED. Microscopic Comment 2. Internal departmental review obtained (Dr. Saralyn Pilar) with agreement. Results are phoned to [facility], nurse in the office of Dr. Loletha Carrow. (MEG 08/03/16)    Diagnosis 12/01/2016 1. Colon, total resection (incl lymph nodes), proctocolectomy - SCATTERED SMALL RESIDUAL FOCI (EACH LESS THAN 0.3 CM) OF INVASIVE MODERATELY DIFFERENTIATED ADENOCARCINOMA EMBEDDED IN SUBMUCOSAL TISSUE WITH EXTENSIVE NEOADJUVANT EFFECT. - TUMOR IS PRESENT WITHIN A REGION MEASURING APPROXIMATELY 2.5 CM IN GREATEST DIMENSION. - ADENOCARCINOMA INVADES THROUGH MUSCULARIS MUCOSA AND INVOLVES SUBSEROSAL SOFT TISSUE. - MARGINS ARE NEGATIVE. - TWO OF THIRTY-FIVE LYMPH NODES ARE POSITIVE FOR METASTATIC ADENOCARCINOMA  (2/35). - LYMPH NODES ALSO DEMONSTRATE VARIABLE DEGREES OF NEOADJUVANT EFFECT. - TWO CALCIFIED AND NECROTIC SOFT TISSUE NODULES ARE ALSO PRESENT WITHOUT VIABLE TUMOR SEEN. - SEE ONCOLOGY TEMPLATE. 2. Colon, resection margin (donut), distal ring final distal margin - BENIGN ANAL MUCOSA. - NO DYSPLASIA OR TUMOR SEEN. Microscopic Comment 1. COLON AND RECTUM (INCLUDING TRANS-ANAL RESECTION): Specimen: Colon and rectum with attached appendix. Procedure: Robotic assisted proctocolectomy. Tumor site: Rectum. Specimen integrity: Intact. Macroscopic intactness of mesorectum: Near complete: The mesorectum is near complete with coning at the distal margin. Macroscopic tumor perforation: No. Invasive tumor: Maximum size: There is a 2.5 cm area of ulcerated colon with neoadjuvant changes and scattered small (less than 0.3 cm) foci of tumor. Histologic type(s): Invasive adenocarcinoma. Histologic grade and differentiation: G2: moderately differentiated/low grade. Type of polyp in which invasive carcinoma arose: Precursor polyp is not identified. Microscopic extension of invasive tumor: Tumor invades through muscularis propria to involve subserosal soft tissues. 1 of 3 FINAL for Dyk, [name] [phone]) Microscopic Comment(continued) Lymph-Vascular invasion: Lymph/vascular invasion is not identified; however, several lymph nodes are positive for metastatic tumor, see below. Peri-neural invasion: Not identified. Tumor deposit(s) (discontinuous extramural extension): No viable tumor deposits are identified. Resection margins (transanal resection margins): Proximal margin: Negative. Distal margin: Negative. Circumferential (radial) (posterior ascending, posterior descending; lateral and posterior mid-rectum; and entire lower 1/3 rectum): Negative. Mucosal margin: Negative. Distance closest margin (if all above margins negative): 0.5 cm (radial / circumferential soft tissue margin in distal  rectum). Distance closest mucosal margin (if negative): Greater than 0.6 cm (based on amount of tissue within cassettes adjacent to distal stapled margin). Treatment effect (neo-adjuvant therapy): Prominent treatment effect is identified within the tumor and within the lymph nodes. Additional polyp(s): No additional polyps identified. Non-neoplastic findings: Diverticulosis. Lymph nodes: number examined 35; number positive: 2. Pathologic Staging: ypT3,  ypN1b. Ancillary studies: As the patient is status post neoadjuvant therapy and there are only small residual foci of tumor, additional studies will not be performed unless otherwise requested.    RADIOGRAPHIC STUDIES: I have personally reviewed the radiological images as listed and agreed with the findings in the report.  EUS 07/29/2016 -Clearly malignant 6cm long, nearly circumferential mass in the distal rectum with distal edge located 1cm from the anal verge. If this is rectal adenocarcinoma it is uT3N2a, Stage IIIa.  COLONOSCOPY 07/28/2016 - Rectal mass. - Four 4 mm polyps in the rectum and in the descending colon, removed with a cold snare. Resected and retrieved. - Diverticulosis in the left colon. - Likely malignant tumor in the distal rectum. Biopsied. Tattooed.  CT of the chest/abd/pelvis with contrast 11/29/2016 IMPRESSION: 1. Interval treatment changes within the perirectal fat with otherwise stable rectosigmoid colon wall thickening. 2. No progressive adenopathy or distant metastases identified. 3. Stable bilateral pulmonary nodules. Short-term stability (for 4 months) is reassuring, although the nodules are too small to optimally characterize by previous PET-CT. Continued CT follow-up recommended.   ASSESSMENT & PLAN: 46 y.o. male without significant past medical history presented with rectal pain and bleeding.  1. 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 He also has a slightly prominent periaortic lymph node, about 8 mm. -I previously reviewed his PET CT scan findings with patient and his girl firend, the small lung nodules and periaortic lymph node are not hypermetabolic, no definitive evidence of metastasis on the PET scan. -He completed neoadjuvant chemoradiation, tolerated moderate well overall.  -I previously reviewed his surgical pathology findings in great details. He had computed surgical resection, but unfortunately still has significant residual disease including 2 positive lymph nodes. -I recommended him to have adjuvant chemotherapy to reduce his risk of recurrence. Due to the positive lymph nodes, I recommend FOLFOX or CAPOX for 4.5 months  -The goal of therapy is curative. However his lung nodules are suspicious for metastatic disease, needs to be followed closely. If he does have lung metastasis, then his cancer is unlikely curable. -He did poorly with the first cycle CAPOX, will change to FOLFOX from cycle 2 -He has recovered well from first cycle chemotherapy. He had a port placed yesterday, lab reviewed, adequate for treatment, we'll proceed to cycle 2 with FOLFOX today. -Due to his high ileostomy output, I will continue supportive care with IVF and nutrition support. I'll arrange IV fluids 1 L normal saline twice a week  2. Diarrhea  -He has watery stool after total colectomy, his ileostomy output has significant increases since he started chemotherapy -I encouraged him to continue using Imodium and Lomotil.  -I have set him up for IV fluids twice a week.   3. 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 -He has undergone total colectomy and proctocolectomy, his risk of secondary colon cancer is minimal -We previously discussed other cancer risks from age syndrome, I recommend him to have EGD every 3-5 years, annual urinalysis for GU cancer screening.   4. Anxiety  -He feels nervous  and anxious since his cancer diagnosis, much improved after starting treatment  -Continue Xanax as needed  5. Social issues -He is out of work, he wants to apply for Medicaid and disability  -He was seen by our Education officer, museum  6. Headache and pain at ileostomy bag -I encouraged him to use Tylenol for headaches.  -He states his pain is unbearable sometimes. He is taking oxycodone. 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  Plan -Cycle 2 chemotherapy with FOLFOX, continue every 2 weeks  -IV fluids twice a week.  -I will see him back in 2 weeks for labs, flush, IV fluids, follow up, and treatment.   All questions were answered. The patient knows to call the clinic with any problems, questions or concerns. I spent 25 minutes counseling the patient face to face. The total time spent in the appointment was 35 minutes and more than 50% was on counseling.   This document serves as a record of services personally performed by Truitt Merle, MD. It was created on her behalf by Martinique Casey, a trained medical scribe. The creation of this record is based on the scribe's personal observations and the provider's statements to them. This document has been checked and approved by the attending provider.  I have reviewed the above documentation for accuracy and completeness, and I agree with the above information.       Truitt Merle, MD 01/27/2017

## 2017-01-26 ENCOUNTER — Encounter (HOSPITAL_BASED_OUTPATIENT_CLINIC_OR_DEPARTMENT_OTHER): Payer: Self-pay | Admitting: *Deleted

## 2017-01-26 ENCOUNTER — Encounter (HOSPITAL_BASED_OUTPATIENT_CLINIC_OR_DEPARTMENT_OTHER)
Admission: RE | Disposition: A | Discharge status: Home / Self Care | Payer: Self-pay | Source: Ambulatory Visit | Attending: Surgery

## 2017-01-26 ENCOUNTER — Ambulatory Visit (HOSPITAL_BASED_OUTPATIENT_CLINIC_OR_DEPARTMENT_OTHER)
Admission: RE | Admit: 2017-01-26 | Discharge: 2017-01-26 | Disposition: A | Discharge status: Home / Self Care | Payer: Medicaid Other | Source: Ambulatory Visit | Attending: Surgery | Admitting: Surgery

## 2017-01-26 ENCOUNTER — Ambulatory Visit (HOSPITAL_COMMUNITY): Payer: Medicaid Other

## 2017-01-26 ENCOUNTER — Ambulatory Visit (HOSPITAL_BASED_OUTPATIENT_CLINIC_OR_DEPARTMENT_OTHER): Payer: Medicaid Other | Admitting: Anesthesiology

## 2017-01-26 DIAGNOSIS — C20 Malignant neoplasm of rectum: Secondary | ICD-10-CM | POA: Insufficient documentation

## 2017-01-26 DIAGNOSIS — Z87891 Personal history of nicotine dependence: Secondary | ICD-10-CM | POA: Diagnosis not present

## 2017-01-26 DIAGNOSIS — T797XXA Traumatic subcutaneous emphysema, initial encounter: Secondary | ICD-10-CM | POA: Insufficient documentation

## 2017-01-26 DIAGNOSIS — Z4682 Encounter for fitting and adjustment of non-vascular catheter: Secondary | ICD-10-CM | POA: Diagnosis not present

## 2017-01-26 DIAGNOSIS — Z833 Family history of diabetes mellitus: Secondary | ICD-10-CM | POA: Insufficient documentation

## 2017-01-26 DIAGNOSIS — Z95828 Presence of other vascular implants and grafts: Secondary | ICD-10-CM

## 2017-01-26 DIAGNOSIS — Z1509 Genetic susceptibility to other malignant neoplasm: Secondary | ICD-10-CM | POA: Insufficient documentation

## 2017-01-26 DIAGNOSIS — Z79899 Other long term (current) drug therapy: Secondary | ICD-10-CM | POA: Insufficient documentation

## 2017-01-26 DIAGNOSIS — Z808 Family history of malignant neoplasm of other organs or systems: Secondary | ICD-10-CM | POA: Insufficient documentation

## 2017-01-26 DIAGNOSIS — F419 Anxiety disorder, unspecified: Secondary | ICD-10-CM | POA: Diagnosis present

## 2017-01-26 DIAGNOSIS — Z801 Family history of malignant neoplasm of trachea, bronchus and lung: Secondary | ICD-10-CM | POA: Insufficient documentation

## 2017-01-26 DIAGNOSIS — Z932 Ileostomy status: Secondary | ICD-10-CM

## 2017-01-26 DIAGNOSIS — Z980 Intestinal bypass and anastomosis status: Secondary | ICD-10-CM | POA: Insufficient documentation

## 2017-01-26 DIAGNOSIS — F411 Generalized anxiety disorder: Secondary | ICD-10-CM | POA: Diagnosis present

## 2017-01-26 DIAGNOSIS — Y838 Other surgical procedures as the cause of abnormal reaction of the patient, or of later complication, without mention of misadventure at the time of the procedure: Secondary | ICD-10-CM | POA: Insufficient documentation

## 2017-01-26 DIAGNOSIS — Z825 Family history of asthma and other chronic lower respiratory diseases: Secondary | ICD-10-CM | POA: Insufficient documentation

## 2017-01-26 DIAGNOSIS — L7681 Other intraoperative complications of skin and subcutaneous tissue: Secondary | ICD-10-CM | POA: Insufficient documentation

## 2017-01-26 HISTORY — PX: PORTACATH PLACEMENT: SHX2246

## 2017-01-26 SURGERY — INSERTION, TUNNELED CENTRAL VENOUS DEVICE, WITH PORT
Anesthesia: Monitor Anesthesia Care | Site: Chest

## 2017-01-26 MED ORDER — CELECOXIB 200 MG PO CAPS
ORAL_CAPSULE | ORAL | Status: AC
  Filled 2017-01-26: qty 2

## 2017-01-26 MED ORDER — MIDAZOLAM HCL 2 MG/2ML IJ SOLN
INTRAMUSCULAR | Status: AC
  Filled 2017-01-26: qty 2

## 2017-01-26 MED ORDER — OXYCODONE HCL 5 MG/5ML PO SOLN
5.0000 mg | Freq: Once | ORAL | Status: DC | PRN
  Filled 2017-01-26: qty 5

## 2017-01-26 MED ORDER — LIDOCAINE HCL 1 % IJ SOLN
INTRAMUSCULAR | Status: DC | PRN
  Administered 2017-01-26: 20 mL

## 2017-01-26 MED ORDER — PROPOFOL 500 MG/50ML IV EMUL
INTRAVENOUS | Status: AC
  Filled 2017-01-26: qty 50

## 2017-01-26 MED ORDER — KETAMINE HCL 10 MG/ML IJ SOLN
INTRAMUSCULAR | Status: AC
  Filled 2017-01-26: qty 1

## 2017-01-26 MED ORDER — FENTANYL CITRATE (PF) 100 MCG/2ML IJ SOLN
INTRAMUSCULAR | Status: DC | PRN
  Administered 2017-01-26 (×2): 12.5 ug via INTRAVENOUS
  Administered 2017-01-26: 25 ug via INTRAVENOUS

## 2017-01-26 MED ORDER — HEPARIN SOD (PORK) LOCK FLUSH 100 UNIT/ML IV SOLN
INTRAVENOUS | Status: DC | PRN
  Administered 2017-01-26: 200 [IU] via INTRAVENOUS

## 2017-01-26 MED ORDER — LACTATED RINGERS IV SOLN
INTRAVENOUS | Status: DC
  Administered 2017-01-26: 11:00:00 via INTRAVENOUS
  Filled 2017-01-26: qty 1000

## 2017-01-26 MED ORDER — PROMETHAZINE HCL 25 MG/ML IJ SOLN
6.2500 mg | INTRAMUSCULAR | Status: DC | PRN
  Filled 2017-01-26: qty 1

## 2017-01-26 MED ORDER — ONDANSETRON HCL 4 MG/2ML IJ SOLN
INTRAMUSCULAR | Status: DC | PRN
  Administered 2017-01-26: 4 mg via INTRAVENOUS

## 2017-01-26 MED ORDER — CEFAZOLIN SODIUM-DEXTROSE 2-4 GM/100ML-% IV SOLN
INTRAVENOUS | Status: AC
  Filled 2017-01-26: qty 100

## 2017-01-26 MED ORDER — PROPOFOL 500 MG/50ML IV EMUL
INTRAVENOUS | Status: DC | PRN
  Administered 2017-01-26: 200 ug/kg/min via INTRAVENOUS

## 2017-01-26 MED ORDER — HEPARIN SODIUM (PORCINE) 5000 UNIT/ML IJ SOLN
Freq: Once | INTRAMUSCULAR | Status: DC
  Filled 2017-01-26 (×2): qty 1.2

## 2017-01-26 MED ORDER — FENTANYL CITRATE (PF) 100 MCG/2ML IJ SOLN
INTRAMUSCULAR | Status: AC
  Filled 2017-01-26: qty 2

## 2017-01-26 MED ORDER — CHLORHEXIDINE GLUCONATE CLOTH 2 % EX PADS
6.0000 | MEDICATED_PAD | Freq: Once | CUTANEOUS | Status: DC
  Filled 2017-01-26: qty 6

## 2017-01-26 MED ORDER — CEFAZOLIN SODIUM-DEXTROSE 2-4 GM/100ML-% IV SOLN
2.0000 g | INTRAVENOUS | Status: AC
  Administered 2017-01-26: 2 g via INTRAVENOUS
  Filled 2017-01-26: qty 100

## 2017-01-26 MED ORDER — MIDAZOLAM HCL 5 MG/5ML IJ SOLN
INTRAMUSCULAR | Status: DC | PRN
  Administered 2017-01-26: 2 mg via INTRAVENOUS

## 2017-01-26 MED ORDER — SODIUM CHLORIDE 0.9 % IR SOLN
Freq: Once | Status: DC
  Filled 2017-01-26 (×2): qty 500

## 2017-01-26 MED ORDER — SODIUM CHLORIDE 0.9 % IV SOLN
INTRAVENOUS | Status: DC | PRN
  Administered 2017-01-26: 5 mL

## 2017-01-26 MED ORDER — MEPERIDINE HCL 25 MG/ML IJ SOLN
6.2500 mg | INTRAMUSCULAR | Status: DC | PRN
  Filled 2017-01-26: qty 1

## 2017-01-26 MED ORDER — FENTANYL CITRATE (PF) 100 MCG/2ML IJ SOLN
25.0000 ug | INTRAMUSCULAR | Status: DC | PRN
  Filled 2017-01-26: qty 1

## 2017-01-26 MED ORDER — ONDANSETRON HCL 4 MG/2ML IJ SOLN
INTRAMUSCULAR | Status: AC
  Filled 2017-01-26: qty 2

## 2017-01-26 MED ORDER — GABAPENTIN 300 MG PO CAPS
ORAL_CAPSULE | ORAL | Status: AC
  Filled 2017-01-26: qty 1

## 2017-01-26 MED ORDER — ACETAMINOPHEN 500 MG PO TABS
1000.0000 mg | ORAL_TABLET | ORAL | Status: AC
  Administered 2017-01-26: 1000 mg via ORAL
  Filled 2017-01-26: qty 2

## 2017-01-26 MED ORDER — CELECOXIB 400 MG PO CAPS
400.0000 mg | ORAL_CAPSULE | ORAL | Status: AC
  Administered 2017-01-26: 400 mg via ORAL
  Filled 2017-01-26: qty 1

## 2017-01-26 MED ORDER — SODIUM CHLORIDE 0.9 % IV SOLN
INTRAVENOUS | Status: DC | PRN
  Administered 2017-01-26: 20 ug/kg/min via INTRAVENOUS

## 2017-01-26 MED ORDER — ACETAMINOPHEN 500 MG PO TABS
ORAL_TABLET | ORAL | Status: AC
  Filled 2017-01-26: qty 2

## 2017-01-26 MED ORDER — DEXAMETHASONE SODIUM PHOSPHATE 10 MG/ML IJ SOLN
INTRAMUSCULAR | Status: AC
  Filled 2017-01-26: qty 1

## 2017-01-26 MED ORDER — BUPIVACAINE-EPINEPHRINE 0.25% -1:200000 IJ SOLN
INTRAMUSCULAR | Status: DC | PRN
Start: 2017-01-26 — End: 2017-01-26
  Administered 2017-01-26: 30 mL

## 2017-01-26 MED ORDER — GABAPENTIN 300 MG PO CAPS
300.0000 mg | ORAL_CAPSULE | ORAL | Status: AC
  Administered 2017-01-26: 300 mg via ORAL
  Filled 2017-01-26: qty 1

## 2017-01-26 MED ORDER — OXYCODONE HCL 5 MG PO TABS
5.0000 mg | ORAL_TABLET | Freq: Once | ORAL | Status: DC | PRN
  Filled 2017-01-26: qty 1

## 2017-01-26 SURGICAL SUPPLY — 47 items
BAG DECANTER FOR FLEXI CONT (MISCELLANEOUS) ×3 IMPLANT
BLADE HEX COATED 2.75 (ELECTRODE) ×3 IMPLANT
BLADE SURG 11 STRL SS (BLADE) ×3 IMPLANT
BLADE SURG 15 STRL LF DISP TIS (BLADE) IMPLANT
BLADE SURG 15 STRL SS (BLADE)
CHLORAPREP W/TINT 26ML (MISCELLANEOUS) ×3 IMPLANT
COVER BACK TABLE 60X90IN (DRAPES) ×3 IMPLANT
COVER MAYO STAND STRL (DRAPES) ×3 IMPLANT
COVER PROBE W GEL 5X96 (DRAPES) ×3 IMPLANT
COVER SURGICAL LIGHT HANDLE (MISCELLANEOUS) ×3 IMPLANT
DECANTER SPIKE VIAL GLASS SM (MISCELLANEOUS) ×1 IMPLANT
DRAPE C-ARM 42X72 X-RAY (DRAPES) ×3 IMPLANT
DRAPE LAPAROTOMY 100X72 PEDS (DRAPES) ×1 IMPLANT
DRAPE LAPAROTOMY TRNSV 102X78 (DRAPE) ×2 IMPLANT
DRAPE UTILITY XL STRL (DRAPES) ×2 IMPLANT
DRSG TEGADERM 2-3/8X2-3/4 SM (GAUZE/BANDAGES/DRESSINGS) ×2 IMPLANT
DRSG TEGADERM 4X4.75 (GAUZE/BANDAGES/DRESSINGS) ×2 IMPLANT
ELECT REM PT RETURN 9FT ADLT (ELECTROSURGICAL) ×3
ELECTRODE REM PT RTRN 9FT ADLT (ELECTROSURGICAL) ×1 IMPLANT
GAUZE SPONGE 4X4 16PLY XRAY LF (GAUZE/BANDAGES/DRESSINGS) ×3 IMPLANT
GLOVE BIO SURGEON STRL SZ 6.5 (GLOVE) ×1 IMPLANT
GLOVE BIO SURGEONS STRL SZ 6.5 (GLOVE) ×1
GLOVE BIOGEL PI IND STRL 6.5 (GLOVE) IMPLANT
GLOVE BIOGEL PI IND STRL 7.5 (GLOVE) IMPLANT
GLOVE BIOGEL PI INDICATOR 6.5 (GLOVE) ×2
GLOVE BIOGEL PI INDICATOR 7.5 (GLOVE) ×6
GLOVE ECLIPSE 8.0 STRL XLNG CF (GLOVE) ×3 IMPLANT
GLOVE INDICATOR 8.0 STRL GRN (GLOVE) ×3 IMPLANT
GOWN STRL REUS W/TWL LRG LVL3 (GOWN DISPOSABLE) ×6 IMPLANT
GOWN STRL REUS W/TWL XL LVL3 (GOWN DISPOSABLE) ×6 IMPLANT
KIT PORT POWER 8FR ISP CVUE (Catheter) ×2 IMPLANT
KIT ROOM TURNOVER WOR (KITS) ×3 IMPLANT
NEEDLE HYPO 22GX1.5 SAFETY (NEEDLE) ×3 IMPLANT
PACK BASIN DAY SURGERY FS (CUSTOM PROCEDURE TRAY) ×3 IMPLANT
PENCIL BUTTON HOLSTER BLD 10FT (ELECTRODE) ×3 IMPLANT
SPONGE GAUZE 2X2 8PLY STER LF (GAUZE/BANDAGES/DRESSINGS) ×1
SPONGE GAUZE 2X2 8PLY STRL LF (GAUZE/BANDAGES/DRESSINGS) ×6 IMPLANT
SUCTION FRAZIER TIP 10 FR DISP (SUCTIONS) IMPLANT
SUT MNCRL AB 4-0 PS2 18 (SUTURE) ×3 IMPLANT
SUT PROLENE 2 0 CT2 30 (SUTURE) ×4 IMPLANT
SUT PROLENE 2 0 SH DA (SUTURE) ×2 IMPLANT
SYR 10ML LL (SYRINGE) ×3 IMPLANT
SYR 20CC LL (SYRINGE) ×3 IMPLANT
TOWEL OR 17X24 6PK STRL BLUE (TOWEL DISPOSABLE) ×5 IMPLANT
TOWEL OR NON WOVEN STRL DISP B (DISPOSABLE) ×2 IMPLANT
TUBE CONNECTING 12'X1/4 (SUCTIONS)
TUBE CONNECTING 12X1/4 (SUCTIONS) IMPLANT

## 2017-01-26 NOTE — Discharge Instructions (Signed)
Post Anesthesia Home Care Instructions  Activity: Get plenty of rest for the remainder of the day. A responsible adult should stay with you for 24 hours following the procedure.  For the next 24 hours, DO NOT: -Drive a car -Paediatric nurse -Drink alcoholic beverages -Take any medication unless instructed by your physician -Make any legal decisions or sign important papers.  Meals: Start with liquid foods such as gelatin or soup. Progress to regular foods as tolerated. Avoid greasy, spicy, heavy foods. If nausea and/or vomiting occur, drink only clear liquids until the nausea and/or vomiting subsides. Call your physician if vomiting continues.  Special Instructions/Symptoms: Your throat may feel dry or sore from the anesthesia or the breathing tube placed in your throat during surgery. If this causes discomfort, gargle with warm salt water. The discomfort should disappear within 24 hours.  If you had a scopolamine patch placed behind your ear for the management of post- operative nausea and/or vomiting:  1. The medication in the patch is effective for 72 hours, after which it should be removed.  Wrap patch in a tissue and discard in the trash. Wash hands thoroughly with soap and water. 2. You may remove the patch earlier than 72 hours if you experience unpleasant side effects which may include dry mouth, dizziness or visual disturbances. 3. Avoid touching the patch. Wash your hands with soap and water after contact with the patch.   PORT-A-CATHETER PLACEMENT:  INSTRUCTIONS AFTER SURGERY  DIET: Follow a light bland diet the first night after arrival home, such as soup, liquids, crackers, etc.  Be sure to include lots of fluids daily.  Avoid fast food or heavy meals as your are more likely to get nauseated.    Take your usually prescribed home medications unless otherwise directed.  PAIN CONTROL: Pain is best controlled by a usual combination of three different methods  TOGETHER: Ice/Heat Over the counter acetaminophen pain medication Prescription pain medication  Most patients will experience some swelling and bruising around the incisions.  Ice packs or heating pads (30-60 minutes up to 6 times a day) will help. Use ice for the first few days to help decrease swelling and bruising, then switch to heat to help relax tight/sore spots and speed recovery.  Some people prefer to use ice alone, heat alone, alternating between ice & heat.  Experiment to what works for you.  Swelling and bruising can take several weeks to resolve.    It is helpful to take an over-the-counter pain medication regularly for the first few weeks.  Using acetaminophen (Tylenol, etc) 500-650mg  four times a day (every meal & bedtime) is usually safest since NSAIDs like ibuprofen are not advisable in patients with kidney disease.  A  prescription for pain medication (such as oxycodone, hydrocodone, etc) may be given to you upon discharge.  Take your pain medication as prescribed.   If you are having problems/concerns with the prescription medicine (does not control pain, nausea, vomiting, rash, itching, etc), please call us [phone] to see if we need to switch you to a different pain medicine that will work better for you and/or control your side effect better. If you need a refill on your pain medication, please contact your pharmacy.  They will contact our office to request authorization. Prescriptions will not be filled after 5 pm or on week-ends.  Avoid getting constipated.   Between the surgery and the pain medications, it is common to experience some constipation.  Increasing fluid intake and taking a  fiber supplement (such as Metamucil, Citrucel, FiberCon, MiraLax, etc) 1-2 times a day regularly will usually help prevent this problem from occurring.  A mild laxative (prune juice, Milk of Magnesia, MiraLax, etc) should be taken according to package directions if there are no bowel  movements after 48 hours.    Wash / shower every day.   You may shower over the dressings as they are waterproof.  Continue to shower over incision(s) after the dressing is off.  The Chemotherapy / Oncology nurse will remove your waterproof bandages in their office a few days after surgery.  Do not remove the bandages unless you are 5 days out from surgery  ACTIVITIES as tolerated:   You may resume regular (light) daily activities beginning the next day--such as daily self-care, walking, climbing stairs--gradually increasing activities as tolerated.  If you can walk 30 minutes without difficulty, it is safe to try more intense activity such as jogging, treadmill, bicycling, low-impact aerobics, swimming, etc. Save the most intensive and strenuous activity for last such as push-ups, heavy lifting, contact sports, etc  Refrain from any heavy lifting or straining until you are off narcotics for pain control.    DO NOT PUSH THROUGH PAIN.  Let pain be your guide: If it hurts to do something, don't do it.  Pain is your body warning you to avoid that activity for another week until the pain goes down. You may drive when you are no longer taking prescription pain medication, you can comfortably wear a seatbelt, and you can safely maneuver your car and apply brakes. You may have sexual intercourse when it is comfortable.   FOLLOW UP with the Oncology / Chemotherapy nurses closely after surgery. Please call [facility] at [phone] only as needed.  The infusion nurses & Oncology usually follow you closely, making the need for follow-up in our office redundant and therefore not needed.  If they or you have concerns, please call us for possible follow-up in our office 9. IF YOU HAVE DISABILITY OR FAMILY LEAVE FORMS, BRING THEM TO THE OFFICE FOR PROCESSING.  DO NOT GIVE THEM TO YOUR DOCTOR.   WHEN TO CALL us [phone]: Poor pain control Reactions / problems with new medications (rash/itching, nausea,  etc)  Fever over 101.5 F (38.5 C) Worsening swelling or bruising Continued bleeding from incision. Increased pain, redness, or drainage from the incision   The clinic staff is available to answer your questions during regular business hours (8:30am-5pm).  Please dont hesitate to call and ask to speak to one of our nurses for clinical concerns.   If you have a medical emergency, go to the nearest emergency room or call 911.  A surgeon from [facility] is always on call at the [facility], [place], [place], [place], [place]  [id number] ? MAIN: [phone] ? TOLL FREE: [phone] ?  FAX [phone] [email or web address]

## 2017-01-26 NOTE — Transfer of Care (Signed)
Immediate Anesthesia Transfer of Care Note  Patient: [name]  Procedure(s) Performed: Procedure(s): PLACEMENT OF PORT-A-CATH CENTRAL LINE WITH FLUOROSCOPY AND ULTRASOUND (N/A)  Patient Location: PACU  Anesthesia Type:MAC  Level of Consciousness: awake, alert , oriented and patient cooperative  Airway & Oxygen Therapy: Patient Spontanous Breathing and Patient connected to nasal cannula oxygen  Post-op Assessment: Report given to RN and Post -op Vital signs reviewed and stable  Post vital signs: Reviewed and stable  Last Vitals:  Vitals:   01/26/17 1041  BP: (!) 90/53  Pulse: 62  Resp: 16  Temp: 36.3 C    Last Pain:  Vitals:   01/26/17 1041  TempSrc: Oral         Complications: No apparent anesthesia complications

## 2017-01-26 NOTE — Anesthesia Postprocedure Evaluation (Signed)
Anesthesia Post Note  Patient: [name]  Procedure(s) Performed: Procedure(s) (LRB): PLACEMENT OF PORT-A-CATH CENTRAL LINE WITH FLUOROSCOPY AND ULTRASOUND (N/A)  Patient location during evaluation: PACU Anesthesia Type: MAC Level of consciousness: awake and alert Pain management: pain level controlled Vital Signs Assessment: post-procedure vital signs reviewed and stable Respiratory status: spontaneous breathing Cardiovascular status: stable Anesthetic complications: no       Last Vitals:  Vitals:   01/26/17 1515 01/26/17 1530  BP: 112/77 112/78  Pulse: (!) 46 (!) 51  Resp: 13 14  Temp:  36.6 C    Last Pain:  Vitals:   01/26/17 1041  TempSrc: Oral                 Nolon Nations

## 2017-01-26 NOTE — H&P (View-Only) (Signed)
[name] 12/21/2016 8:42 AM Location: [facility] Patient #: [id number] DOB: 07/26/1971 Married / Language: English / Race: White Male   History of Present Illness (Tyrone Mccomber C. Kushal Saunders MD; 12/21/2016 10:29 AM) The patient is a 45 year old male who presents with colorectal cancer. Note for "Colorectal cancer": Patient returns status post robotically-assisted total abdominal proctocolectomy with ileoanal J-pouch anastomosis and diverting loop ileostomy 12/01/2016.  Patient comes today with his family. He has been getting IV fluid infusions via his PICC line 3 times a week. Went down from 2 L to 1 L every Monday Wednesday Friday. He comes today with his family. He's lost about 10 pounds unintentionally. He ran out of tramadol but didn't really needed any more. However, he's had some occasional pains on the left side of his abdomen the past couple days. No nausea or vomiting. Still some anal drainage but not too severe. Not wearing diapers. Appetite okay. No nausea or vomiting. He is emptying the bag about 4 times a day. He does not need any antidiarrheals. He's urinating fine. Not lightheaded or dizzy. He continues to stay away from any cigarettes as he did preop. Appetite down but coming back slowly. No fevers chills or sweats. Energy level down but trying to come back   12/01/2016  PATIENT: [name] 45 y.o. 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 Gia Lusher, MD, FACS, MASCRS  ASSISTANT: Alicia Thomas, MD, FACS, FASCRS  ANESTHESIA: General, Local anesthetic as a field block and anorectal and perianal field block}  EBL: Total I/O In: 4250 [I.V.:4000; IV Piggyback:250] Out: 700 [Urine:500; Blood:200] "see anesthesia record"  Delay start of Pharmacological VTE agent (>24hrs) due to surgical blood loss or risk of bleeding: no  DRAINS: 19 Fr Blake drain in the pelvis  SPECIMEN: Source of Specimen: 1. COLON AND RECTUM 2. DISTAL ANASTOMOTIC RING  DISPOSITION OF SPECIMEN: PATHOLOGY  COUNTS: YES  PLAN OF CARE: Admit to inpatient  PATIENT DISPOSITION: PACU - hemodynamically stable.  INDICATION:   Patient with bowel changes and found to have very distal bulky rectal cancer. Genetic studies showed Lynch syndrome. Recommend patient made for low anterior resection. Also abdominal colectomy given its to [name] syndrome. Offered ileal pouch anal anastomosis with diverting loop ileostomy. Possible permanent ileostomy discussed.   The anatomy & physiology of the digestive tract was discussed. The pathophysiology was discussed. Natural history risks without surgery was discussed. I worked to give an overview of the disease and the frequent need to have multispecialty involvement. I feel the risks of no intervention will lead to serious problems that outweigh the operative risks; therefore, I recommended a partial colectomy to remove the pathology. Laparoscopic & open techniques were discussed.   Risks such as bleeding, infection, abscess, leak, reoperation, possible ostomy, hernia, impotence, incontinence, heart attack, death, and other risks were discussed. I noted a good likelihood this will help address the problem. Goals of post-operative recovery were discussed as well.   We will work to minimize  complications. Educational materials on the pathology had been given in the office. Questions were answered.   The patient expressed understanding & wished to proceed with surgery.  OR FINDINGS:  Patient had a bulky scarring consistent with rectal cancer involving the distal half of the rectum. Scar 3-4cm from the anal verge.  No obvious metastatic disease on visceral parietal peritoneum or liver.  He has a ileal J-pouch. 15 cm long. 25 EEA stapled to the anal canal. Ring at just above the sphincters. Small anterior leak. The anastomosis rests 0 cm from the anal verge by rigid proctoscopy.  Diverting loop ileostomy.    PATHOLOGY: FINAL DIAGNOSIS Diagnosis 1. Colon, total resection (incl lymph nodes), proctocolectomy - SCATTERED SMALL RESIDUAL FOCI (EACH LESS THAN 0.3 CM) OF INVASIVE MODERATELY DIFFERENTIATED ADENOCARCINOMA EMBEDDED IN SUBMUCOSAL TISSUE WITH EXTENSIVE NEOADJUVANT EFFECT. - TUMOR IS PRESENT WITHIN A REGION MEASURING APPROXIMATELY 2.5 CM IN GREATEST DIMENSION. - ADENOCARCINOMA INVADES THROUGH MUSCULARIS MUCOSA AND INVOLVES SUBSEROSAL SOFT TISSUE. - MARGINS ARE NEGATIVE. - TWO OF THIRTY-FIVE LYMPH NODES ARE POSITIVE FOR METASTATIC ADENOCARCINOMA (2/35). - LYMPH NODES ALSO DEMONSTRATE VARIABLE DEGREES OF NEOADJUVANT EFFECT. - TWO CALCIFIED AND NECROTIC SOFT TISSUE NODULES ARE ALSO PRESENT WITHOUT VIABLE TUMOR SEEN. - SEE ONCOLOGY TEMPLATE. 2. Colon, resection margin (donut), distal ring final distal margin - BENIGN ANAL MUCOSA. - NO DYSPLASIA OR TUMOR SEEN. Microscopic Comment 1. COLON AND RECTUM (INCLUDING TRANS-ANAL RESECTION): Specimen: Colon and rectum with attached appendix. Procedure: Robotic assisted proctocolectomy. Tumor site: Rectum. Specimen integrity: Intact. Macroscopic intactness of mesorectum: Near complete: The mesorectum is near complete with coning at the distal margin. Macroscopic tumor perforation: No. Invasive tumor: Maximum size:  There is a 2.5 cm area of ulcerated colon with neoadjuvant changes and scattered small (less than 0.3 cm) foci of tumor. Histologic type(s): Invasive adenocarcinoma. Histologic grade and differentiation: G2: moderately differentiated/low grade. Type of polyp in which invasive carcinoma arose: Precursor polyp is not identified. Microscopic extension of invasive tumor: Tumor invades through muscularis propria to involve subserosal soft tissues. 1 of 3 FINAL for Bielefeld, Yaniv T ([id number]) Microscopic Comment(continued) Lymph-Vascular invasion: Lymph/vascular invasion is not identified; however, several lymph nodes are positive for metastatic tumor, see below. Peri-neural invasion: Not identified. Tumor deposit(s) (discontinuous extramural extension): No viable tumor deposits are identified. Resection margins (transanal resection margins): Proximal margin: Negative. Distal margin: Negative. Circumferential (radial) (posterior ascending, posterior descending; lateral and posterior mid-rectum; and entire lower 1/3 rectum): Negative. Mucosal margin: Negative. Distance closest margin (if all above margins negative): 0.5 cm (radial / circumferential soft tissue margin in distal rectum). Distance closest mucosal margin (if negative): Greater than 0.6 cm (based on amount of tissue within cassettes adjacent to distal stapled margin). Treatment effect (neo-adjuvant therapy): Prominent treatment effect is identified within the tumor and within the lymph nodes. Additional polyp(s): No additional polyps identified. Non-neoplastic findings: Diverticulosis. Lymph nodes: number examined 35; number positive: 2. Pathologic Staging: ypT3, ypN1b. Ancillary studies: As the patient is status post neoadjuvant therapy and there are only small residual foci of tumor, additional studies will not be performed unless otherwise requested. (RH:ecj 12/06/2016) ROBERT HILLARD MD Pathologist, Electronic  Signature (Case signed 12/06/2016) Specimen Jashun Puertas and Clinical Information Specimen(s) Obtained: 1. Colon, total resection (incl lymph nodes), proctocolectomy 2. Colon, resection margin (donut), distal ring final distal margin Specimen Clinical Information 1. low rectal cancer (kp) Melaysia Streed 1. Specimen: Clinically proctocolectomy, which includes portion of terminal ileum, and attached appendix. There is at least portion distal 1/3   of rectum. Specimen integrity: Intact Specimen length: Portion of terminal ileum is 4 cm and the colon is 152 cm from cecal to distal margin Mesorectal intactness: The mesorectum is near complete, with coning at the distal margin. There is muscle visible around the distal 3 cm. This area (mesorectal envelop) is inked black. Tumor location: Distal 1/3 of rectum Tumor size: Along predominantly the posterior wall to left lateral wall is a 1.5 cm in length and 2.5 cm in width, and up to 0.5 cm deep cavitary scarred area of tan red granular flattened mucosa. Residual lesion is not identified. Percent of bowel circumference involved: Approximately 25 to 30% Tumor distance to margins: Proximal: 154.5 cm Distal: The left lateral edge of the scar is focally involved with the stapled distal margin. 2 of 3 FINAL for Larcom, Hermenegildo T ([id number]) Kieffer Blatz(continued) Radial (posterior ascending, posterior descending; lateral and posterior mid-rectum; and entire lower 1/3 rectum): The scar is 0.8 cm from the inked perirectal soft tissue margin. Macroscopic extent of tumor invasion: There is no Deloras Reichard lesion to evaluate invasion. Within the wall beneath the scar is gray white indurated tissue, which abuts the inked perirectal soft tissue margin. Total presumed lymph nodes: Found are forty possible lymph nodes ranging from 0.3 to 1.2 cm. Extramural satellite tumor nodules: None Mucosal polyp(s): None Additional findings: There are several unperforated diverticuli within the sigmoid.  The appendix is 6.2 cm in length, averages 0.6 cm in diameter, has a smooth pink serosa and unremarkable cut surfaces. Block summary: A = proximal margin B, C = sections adjacent to stapled distal margin D-H = area of scar, full thickness I, J = scar, inner half sections K = diverticulum L = appendix. M = four nodes, whole N = four nodes, whole O = four nodes, whole P = five node, whole Q = four nodes, whole R = four nodes, whole S = four nodes, whole T = five nodes, whole U = five nodes, whole V = distal lymph node, bisected Twenty-two blocks. 2. Received in formalin is a 1 cm in length and up to 1 cm in diameter tubular portion of tissue, unoriented, with one end having possible light brown skin. On opening, there is tan pink smooth mucosa. The specimen is sectioned lengthwise and entirely submitted in two blocks. (SSW:kh 12-04-16) Report signed out from the following location(s) Technical component and interpretation was performed at [facility] [address], [place], [place] [address]. CLIA #: [id number], 3 of 3   Medication History (Alisha Spillers, CMA; 12/21/2016 8:42 AM) Medications Reconciled  Vitals (Alisha Spillers CMA; 12/21/2016 8:42 AM) 12/21/2016 8:42 AM Weight: 141 lb Pulse: 104 (Regular)  BP: 110/64 (Sitting, Left Arm, Standard)       Physical Exam (Amberli Ruegg C. Ashton Belote MD; 12/21/2016 10:28 AM) General Mental Status-Alert. General Appearance-Not in acute distress. Voice-Normal. Note: Tired but not toxic.   Integumentary Global Assessment Upon inspection and palpation of skin surfaces of the - Distribution of scalp and body hair is normal. General Characteristics Overall examination of the patient's skin reveals - no rashes and no suspicious lesions.  Head and Neck Head-normocephalic, atraumatic with no lesions or palpable masses. Face Global Assessment - atraumatic, no absence of expression. Neck Global Assessment -  no abnormal movements, no decreased range of motion. Trachea-midline. Thyroid Gland Characteristics - non-tender.  Eye Eyeball - Left-Extraocular movements intact, No Nystagmus. Eyeball - Right-Extraocular movements intact, No Nystagmus. Upper Eyelid - Left-No Cyanotic. Upper Eyelid - Right-No Cyanotic.  Chest   and Lung Exam Inspection Accessory muscles - No use of accessory muscles in breathing.  Abdomen Note: Right lower quadrant ileostomy pink with thick dark green succus in bag. No leak or rash. Blake drain in left upper quadrant with thin serosanguineous fluid a little bit of coagulum. I removed. He tolerated well. Incisions with normal healing ridges. No cellulitis. No guarding/rebound tenderness   Peripheral Vascular Upper Extremity Inspection - Left - Not Gangrenous, No Petechiae. Right - Not Gangrenous, No Petechiae.  Neurologic Neurologic evaluation reveals -normal attention span and ability to concentrate, able to name objects and repeat phrases. Appropriate fund of knowledge and normal coordination.  Neuropsychiatric Mental status exam performed with findings of-able to articulate well with normal speech/language, rate, volume and coherence and no evidence of hallucinations, delusions, obsessions or homicidal/suicidal ideation. Orientation-oriented X3.  Musculoskeletal Global Assessment Gait and Station - normal gait and station. Note: Right upper inner arm PICC line in place. I removed. He tolerated well.   Lymphatic General Lymphatics Description - No Generalized lymphadenopathy.    Assessment & Plan (Marvelle Caudill C. Vernee Baines MD; 12/21/2016 9:08 AM) S/P COLON RESECTION - 1st postop visit (Z90.49) Current Plans Follow up with us in the office in 3 WEEKS.  Call us sooner as needed.  Pt Education - Education: Pathology Report given to patient Pt Education - CCS Colectomy post-op instructions: discussed with patient and provided  information. Consider follow up colonoscopy by your gastroenterologist, depending on your diagnosis. Call your gastroenterologist for advice  If you had a colon or rectal cancer resected by surgery, you should strongly consider getting a colonoscopy by your gastroenterologict one year after the colon cancer was removed. If it was a benign polyp that was removed by colon resection, consider follow-up colonoscopy in about 3 years.  RECTAL ADENOCARCINOMA (C20) Impression: Rectal cancer 4cm from anal verge s/p chemoXRT. Status post robotic resection.  Pathology ypT3 ypN1 (2/35 lymph nodes). Discussed the GI tumor Board. He would benefit from post-adjuvant chemotherapy. A 3 months course would be appropriate is what I recall Dr. [name] saying  Once he has completed that, plan evaluation of the ileoanal J-pouch. If that is clean, then Loop ileostomy takedown. Most likely 4-6 weeks after completing chemotherapy it to give his body chance to recover. Current Plans Started TraMADol HCl 50MG, 1-2 Tablet every six hours, as needed, #40, 12/21/2016, No Refill. 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 Status post completion proctocolectomy as prophylaxis against high likelihood of future colorectal cancers. Upper endoscopy and other protocols per gastroenterology and oncology - screening not as definite in those regions.  Individuals with Lynch syndrome should undergo screening for CRC and extracolonic cancers. We suggest the following approach (see 'Cancer screening' </contents/lynch-syndrome-hereditary-nonpolyposis-colorectal-cancer-screening-and-management?search=lynch%20syndrome%20screening&sectionRank=1&usage_type=default&anchor=[id number]&source=machineLearning&selectedTitle=1~92&display_rank=1> above): .Annual colonoscopy starting between the ages of 20 and 25 years, or two to five years prior to the earliest age of CRC diagnosis in the family,  whichever comes first. In families with MSH6 or PMS2 mutations, screening can start at age 25 to 30 or two to five years prior to the earliest CRC in the family, unless an early-onset CRC has been diagnosed in a given family. .Annual screening for endometrial and ovarian cancer with pelvic examination, endometrial biopsy, and transvaginal ultrasound beginning at age 30 to 35 years, or three to five years earlier than the earliest age of diagnosis of these cancers in the family (whichever is earlier). We offer prophylactic hysterectomy and salpingo-oophorectomy in women with Lynch syndrome at the end of childbearing or   at age 40 years. .Upper endoscopy with biopsy of the gastric antrum starting at 30 to 35 years and treatment of Helicobacter pylori infection when found on biopsy. We perform a repeat upper endoscopy every two to three years in individuals with risk factors for gastric cancer. We carefully inspect the distal duodenum and terminal ileum for small intestinal cancers during upper endoscopy and colonoscopy, respectively. .Annual urinalysis examination beginning at age 30 to 35 years. .Annual physical examination including careful skin and neurological exami ILEOSTOMY IN PLACE (Z93.2) Impression: Doing relatively well with the loop ileostomy. Only for empties a day. No antidiarrheals.  I do not think he needs any IV fluids anymore. I removed his PICC line. Continue oral hydration as tolerated. Antidiarrheals as needed. Current Plans Pt Education - CCS Ostomy HCI (Lily Velasquez): discussed with patient and provided information.   Addendum: Felt to benefit from chemotherapy.  Request made for Port-A-Cath placement.  We will try and get it done before start date of eight February.  Annel Zunker C. Jamear Carbonneau, M.D., F.A.C.S. Gastrointestinal and Minimally Invasive Surgery [facility] [address], [address] [place], [place] [address] [phone] Main / Paging   

## 2017-01-26 NOTE — Interval H&P Note (Signed)
History and Physical Interval Note:  01/26/2017 12:25 PM  [name]  has presented today for surgery, with the diagnosis of COLON CANCER  The various methods of treatment have been discussed with the patient and family. After consideration of risks, benefits and other options for treatment, the patient has consented to  Procedure(s): PLACEMENT OF PORT-A-CATH CENTRAL LINE WITH FLUOROSCOPY AND ULTRASOUND (N/A) as a surgical intervention .  The patient's history has been reviewed, patient examined, no change in status, stable for surgery.  I have reviewed the patient's chart and labs.  Questions were answered to the patient's satisfaction.     Rebeccah Ivins C.

## 2017-01-26 NOTE — Anesthesia Procedure Notes (Signed)
Procedure Name: MAC Date/Time: 01/26/2017 12:35 PM Performed by: Wanita Chamberlain Pre-anesthesia Checklist: Patient identified, Timeout performed, Emergency Drugs available, Suction available and Patient being monitored Patient Re-evaluated:Patient Re-evaluated prior to inductionOxygen Delivery Method: Nasal cannula Intubation Type: IV induction Placement Confirmation: positive ETCO2 Dental Injury: Teeth and Oropharynx as per pre-operative assessment

## 2017-01-26 NOTE — Op Note (Signed)
01/26/2017  1:46 PM  PATIENT:  [name]  46 y.o. male  Patient Care Team: [name] as PCP - General (General Practice) Tania Ade, RN as Registered Nurse Michael Boston, MD as Consulting Physician (General Surgery) Doran Stabler, MD as Consulting Physician (Gastroenterology) Kyung Rudd, MD as Consulting Physician (Radiation Oncology) Truitt Merle, MD as Consulting Physician (Oncology)  PRE-OPERATIVE DIAGNOSIS:  COLON CANCER,need for chemotherapy  POST-OPERATIVE DIAGNOSIS:  COLON CANCER, need for chemotherapy  PROCEDURE:   PLACEMENT OF PORT-A-CATH CENTRAL LINE WITH FLUOROSCOPY AND ULTRASOUND  SURGEON:  Adin Hector, MD  ASSISTANT: Edward Qualia, [name], [employer]   ANESTHESIA:   local and IV sedation  EBL:  Total I/O In: 500 [I.V.:500] Out: 5 [Blood:5]  Delay start of Pharmacological VTE agent (>24hrs) due to surgical blood loss or risk of bleeding:  no  DRAINS: none   SPECIMEN:  No Specimen  DISPOSITION OF SPECIMEN:  N/A  COUNTS:  YES  PLAN OF CARE: Discharge to home after PACU  PATIENT DISPOSITION:  PACU - hemodynamically stable.  INDICATION: Patient with need for IV therapy and chemotherapy for colon cancer.   Port-A-Cath placement was requested.  Use of a central venous catheter for intravenous therapy was discussed.  Technique of catheter placement using ultrasound and fluoroscopy guidance was discussed.  Risks such as bleeding, infection, pneumothorax, catheter occlusion, reoperation, and other risks were discussed.   I noted a good likelihood this will help address the problem.  Questions were answered.  The patient expressed understanding & wishes to proceed.  OR FINDINGS: Normal-appearing anatomy.  Is an 8 [place] ClearVue power port. It goes through the right internal jugular vein.  Tip in the SVC/right atrial junction  DESCRIPTION:   Procedure: Informed consent was confirmed. Patient was brought the operating room. and positioned  supine. Arms were tucked. The patient underwent deep sedation. Neck and chest were clipped and prepped and draped in a sterile fashion. A surgical timeout confirmed our plan.  I placed a field block of local anesthesia on the neck & chest  I used the ultrasound to identify the right internal jugular vein. I entered into it using on the first venipuncture under direct ultrasound guidance. Wire was passed into the inferior vena cava by fluoroscopy.  I made an incision in the lateral infraclavicular pocket. Made a subcutaneous pocket. I tunneled the power port from the chest wound to the neck puncture site. The port was secured to the left anterior chest wall using 2-0 Prolene interrupted stitches x4. Catheter flushed well.  I used a dilator on the wire using Seldinger technique to dilate the neck tract. Then I used a dilator with a peel away sheath.  We used fluoroscopy.  I pulled the wire back into the right atrial/superior vena cava junction.  I pulled the wire back until it was at the neck puncture site. This gave the true measurement of the intravenous segment. I cut the catheter to appropriate length. I removed the wire and dilator. The catheter was placed within the sheath. The sheath was peeled away.  Fluoroscopy confirmed the tip in the midright atrium.  I pulled the catheter back in the supraclavicular region to help pull the tip at the right atrial/distal SVC junction.  Catheter aspirated and flushed well. On final fluoro reevaluation the tip seen to be in good position in the distal SVC.    I closed the wounds using 4 Monocryl stitch & placed sterile dressings. Patient should  go home later today. Catheter is okay to use.  Adin Hector, M.D., F.A.C.S. Gastrointestinal and Minimally Invasive Surgery [facility] [address] [place], [place] [address] [phone] Main / Paging

## 2017-01-26 NOTE — Anesthesia Preprocedure Evaluation (Addendum)
Anesthesia Evaluation  Patient identified by MRN, date of birth, ID band Patient awake    Reviewed: Allergy & Precautions, NPO status , Patient's Chart, lab work & pertinent test results  Airway Mallampati: II  TM Distance: >3 FB Neck ROM: Full    Dental  (+) Dental Advisory Given, Edentulous Upper, Poor Dentition,    Pulmonary former smoker,    breath sounds clear to auscultation       Cardiovascular negative cardio ROS   Rhythm:Regular Rate:Normal     Neuro/Psych    GI/Hepatic Neg liver ROS, Rectal cancer.   Endo/Other  negative endocrine ROS  Renal/GU negative Renal ROS     Musculoskeletal   Abdominal   Peds  Hematology negative hematology ROS (+)   Anesthesia Other Findings   Reproductive/Obstetrics                            Anesthesia Physical  Anesthesia Plan  ASA: II  Anesthesia Plan: MAC   Post-op Pain Management:    Induction: Intravenous  Airway Management Planned:   Additional Equipment:   Intra-op Plan:   Post-operative Plan:   Informed Consent: I have reviewed the patients History and Physical, chart, labs and discussed the procedure including the risks, benefits and alternatives for the proposed anesthesia with the patient or authorized representative who has indicated his/her understanding and acceptance.   Dental advisory given  Plan Discussed with: CRNA  Anesthesia Plan Comments: (ERAS protocol)        Anesthesia Quick Evaluation

## 2017-01-27 ENCOUNTER — Encounter (HOSPITAL_BASED_OUTPATIENT_CLINIC_OR_DEPARTMENT_OTHER): Payer: Self-pay | Admitting: Surgery

## 2017-01-27 ENCOUNTER — Ambulatory Visit: Payer: Medicaid Other

## 2017-01-27 ENCOUNTER — Ambulatory Visit (HOSPITAL_BASED_OUTPATIENT_CLINIC_OR_DEPARTMENT_OTHER): Payer: Medicaid Other

## 2017-01-27 ENCOUNTER — Other Ambulatory Visit: Payer: Medicaid Other

## 2017-01-27 ENCOUNTER — Other Ambulatory Visit (HOSPITAL_BASED_OUTPATIENT_CLINIC_OR_DEPARTMENT_OTHER): Payer: Medicaid Other

## 2017-01-27 ENCOUNTER — Telehealth: Payer: Self-pay | Admitting: Hematology

## 2017-01-27 ENCOUNTER — Ambulatory Visit (HOSPITAL_BASED_OUTPATIENT_CLINIC_OR_DEPARTMENT_OTHER): Payer: Medicaid Other | Admitting: Hematology

## 2017-01-27 VITALS — BP 95/60 | HR 65 | Resp 18 | Ht 71.0 in | Wt 145.9 lb

## 2017-01-27 DIAGNOSIS — Z5111 Encounter for antineoplastic chemotherapy: Secondary | ICD-10-CM

## 2017-01-27 DIAGNOSIS — C20 Malignant neoplasm of rectum: Secondary | ICD-10-CM

## 2017-01-27 DIAGNOSIS — R51 Headache: Secondary | ICD-10-CM

## 2017-01-27 DIAGNOSIS — F419 Anxiety disorder, unspecified: Secondary | ICD-10-CM | POA: Diagnosis not present

## 2017-01-27 DIAGNOSIS — R197 Diarrhea, unspecified: Secondary | ICD-10-CM | POA: Diagnosis not present

## 2017-01-27 LAB — CBC WITH DIFFERENTIAL/PLATELET
BASO%: 0.9 % (ref 0.0–2.0)
Basophils Absolute: 0 10*3/uL (ref 0.0–0.1)
EOS%: 3.3 % (ref 0.0–7.0)
Eosinophils Absolute: 0.2 10*3/uL (ref 0.0–0.5)
HEMATOCRIT: 38.5 % (ref 38.4–49.9)
HEMOGLOBIN: 13.1 g/dL (ref 13.0–17.1)
LYMPH#: 0.6 10*3/uL — AB (ref 0.9–3.3)
LYMPH%: 12.1 % — ABNORMAL LOW (ref 14.0–49.0)
MCH: 28.6 pg (ref 27.2–33.4)
MCHC: 34 g/dL (ref 32.0–36.0)
MCV: 84.1 fL (ref 79.3–98.0)
MONO#: 0.6 10*3/uL (ref 0.1–0.9)
MONO%: 12.3 % (ref 0.0–14.0)
NEUT#: 3.3 10*3/uL (ref 1.5–6.5)
NEUT%: 71.4 % (ref 39.0–75.0)
Platelets: 182 10*3/uL (ref 140–400)
RBC: 4.58 10*6/uL (ref 4.20–5.82)
RDW: 14.8 % — AB (ref 11.0–14.6)
WBC: 4.6 10*3/uL (ref 4.0–10.3)

## 2017-01-27 LAB — COMPREHENSIVE METABOLIC PANEL
ALBUMIN: 2.5 g/dL — AB (ref 3.5–5.0)
ALK PHOS: 60 U/L (ref 40–150)
ALT: 21 U/L (ref 0–55)
AST: 21 U/L (ref 5–34)
Anion Gap: 5 mEq/L (ref 3–11)
BUN: 6.2 mg/dL — AB (ref 7.0–26.0)
CALCIUM: 8.3 mg/dL — AB (ref 8.4–10.4)
CO2: 25 mEq/L (ref 22–29)
CREATININE: 0.7 mg/dL (ref 0.7–1.3)
Chloride: 111 mEq/L — ABNORMAL HIGH (ref 98–109)
EGFR: >90 mL/min/{1.73_m2} (ref >90)
Glucose: 116 mg/dl (ref 70–140)
Potassium: 3.7 mEq/L (ref 3.5–5.1)
Sodium: 141 mEq/L (ref 136–145)
TOTAL PROTEIN: 4.7 g/dL — AB (ref 6.4–8.3)

## 2017-01-27 LAB — CEA (IN HOUSE-CHCC): CEA (CHCC-IN HOUSE): 1.61 ng/mL (ref 0.00–5.00)

## 2017-01-27 MED ORDER — SODIUM CHLORIDE 0.9% FLUSH
10.0000 mL | INTRAVENOUS | Status: DC | PRN
  Filled 2017-01-27: qty 10

## 2017-01-27 MED ORDER — PALONOSETRON HCL INJECTION 0.25 MG/5ML: 0.2500 mg | Freq: Once | INTRAVENOUS | Status: DC

## 2017-01-27 MED ORDER — DEXAMETHASONE SODIUM PHOSPHATE 10 MG/ML IJ SOLN
10.0000 mg | Freq: Once | INTRAMUSCULAR | Status: AC
  Administered 2017-01-27: 10 mg via INTRAVENOUS

## 2017-01-27 MED ORDER — DEXAMETHASONE SODIUM PHOSPHATE 10 MG/ML IJ SOLN
INTRAMUSCULAR | Status: AC
  Filled 2017-01-27: qty 1

## 2017-01-27 MED ORDER — HEPARIN SOD (PORK) LOCK FLUSH 100 UNIT/ML IV SOLN
250.0000 [IU] | Freq: Once | INTRAVENOUS | Status: DC | PRN
  Filled 2017-01-27: qty 5

## 2017-01-27 MED ORDER — PALONOSETRON HCL INJECTION 0.25 MG/5ML
0.2500 mg | Freq: Once | INTRAVENOUS | Status: AC
Start: 2017-01-27 — End: 2017-01-27
  Administered 2017-01-27: 0.25 mg via INTRAVENOUS

## 2017-01-27 MED ORDER — SODIUM CHLORIDE 0.9 % IV SOLN
2400.0000 mg/m2 | INTRAVENOUS | Status: DC
  Filled 2017-01-27: qty 87

## 2017-01-27 MED ORDER — HEPARIN SOD (PORK) LOCK FLUSH 100 UNIT/ML IV SOLN
500.0000 [IU] | Freq: Once | INTRAVENOUS | Status: DC | PRN
  Filled 2017-01-27: qty 5

## 2017-01-27 MED ORDER — FLUOROURACIL CHEMO INJECTION 2.5 GM/50ML
400.0000 mg/m2 | Freq: Once | INTRAVENOUS | Status: AC
  Administered 2017-01-27: 750 mg via INTRAVENOUS
  Filled 2017-01-27: qty 15

## 2017-01-27 MED ORDER — SODIUM CHLORIDE 0.9 % IV SOLN
Freq: Once | INTRAVENOUS | Status: AC
  Administered 2017-01-27: 14:00:00 via INTRAVENOUS

## 2017-01-27 MED ORDER — DEXAMETHASONE SODIUM PHOSPHATE 10 MG/ML IJ SOLN: 10.0000 mg | Freq: Once | INTRAMUSCULAR | Status: DC

## 2017-01-27 MED ORDER — PALONOSETRON HCL INJECTION 0.25 MG/5ML
INTRAVENOUS | Status: AC
  Filled 2017-01-27: qty 5

## 2017-01-27 MED ORDER — DEXTROSE 5 % IV SOLN: Freq: Once | INTRAVENOUS | Status: DC

## 2017-01-27 MED ORDER — ALTEPLASE 2 MG IJ SOLR
2.0000 mg | Freq: Once | INTRAMUSCULAR | Status: DC | PRN
  Filled 2017-01-27: qty 2

## 2017-01-27 MED ORDER — OXALIPLATIN CHEMO INJECTION 100 MG/20ML
83.0000 mg/m2 | Freq: Once | INTRAVENOUS | Status: AC
  Administered 2017-01-27: 150 mg via INTRAVENOUS
  Filled 2017-01-27: qty 20

## 2017-01-27 MED ORDER — SODIUM CHLORIDE 0.9% FLUSH
10.0000 mL | INTRAVENOUS | Status: DC | PRN
  Administered 2017-01-27: 10 mL
  Filled 2017-01-27: qty 10

## 2017-01-27 MED ORDER — DEXTROSE 5 % IV SOLN: 130.0000 mg/m2 | Freq: Once | INTRAVENOUS | Status: DC

## 2017-01-27 MED ORDER — SODIUM CHLORIDE 0.9 % IV SOLN
2400.0000 mg/m2 | INTRAVENOUS | Status: DC
  Administered 2017-01-27: 4350 mg via INTRAVENOUS
  Filled 2017-01-27: qty 87

## 2017-01-27 MED ORDER — LEUCOVORIN CALCIUM INJECTION 350 MG
400.0000 mg/m2 | Freq: Once | INTRAVENOUS | Status: AC
  Administered 2017-01-27: 728 mg via INTRAVENOUS
  Filled 2017-01-27: qty 36.4

## 2017-01-27 NOTE — Telephone Encounter (Signed)
Labs, follow up, IVF, Flush and Chemo scheduled per 01/27/17 los. Patient was given a copy of the AVS report and appointment schedule per 01/27/17 los.

## 2017-01-27 NOTE — Patient Instructions (Addendum)
[facility] Discharge Instructions for Patients Receiving Chemotherapy  Today you received the following chemotherapy agents: Oxaliplatin, Leucovorin, Adrucil.  To help prevent nausea and vomiting after your treatment, we encourage you to take your nausea medication as directed.    If you develop nausea and vomiting that is not controlled by your nausea medication, call the clinic.   BELOW ARE SYMPTOMS THAT SHOULD BE REPORTED IMMEDIATELY:  *FEVER GREATER THAN 100.5 F  *CHILLS WITH OR WITHOUT FEVER  NAUSEA AND VOMITING THAT IS NOT CONTROLLED WITH YOUR NAUSEA MEDICATION  *UNUSUAL SHORTNESS OF BREATH  *UNUSUAL BRUISING OR BLEEDING  TENDERNESS IN MOUTH AND THROAT WITH OR WITHOUT PRESENCE OF ULCERS  *URINARY PROBLEMS  *BOWEL PROBLEMS  UNUSUAL RASH Items with * indicate a potential emergency and should be followed up as soon as possible.  Feel free to call the clinic you have any questions or concerns. The clinic phone number is [phone].  Please show the [facility] at check-in to the Emergency Department and triage nurse.   Oxaliplatin Injection What is this medicine? OXALIPLATIN (ox AL i PLA tin) is a chemotherapy drug. It targets fast dividing cells, like cancer cells, and causes these cells to die. This medicine is used to treat cancers of the colon and rectum, and many other cancers. This medicine may be used for other purposes; ask your health care provider or pharmacist if you have questions. COMMON BRAND NAME(S): Eloxatin What should I tell my health care provider before I take this medicine? They need to know if you have any of these conditions: -kidney disease -an unusual or allergic reaction to oxaliplatin, other chemotherapy, other medicines, foods, dyes, or preservatives -pregnant or trying to get pregnant -breast-feeding How should I use this medicine? This drug is given as an infusion into a vein. It is administered in a hospital or  clinic by a specially trained health care professional. Talk to your pediatrician regarding the use of this medicine in children. Special care may be needed. Overdosage: If you think you have taken too much of this medicine contact a poison control center or emergency room at once. NOTE: This medicine is only for you. Do not share this medicine with others. What if I miss a dose? It is important not to miss a dose. Call your doctor or health care professional if you are unable to keep an appointment. What may interact with this medicine? -medicines to increase blood counts like filgrastim, pegfilgrastim, sargramostim -probenecid -some antibiotics like amikacin, gentamicin, neomycin, polymyxin B, streptomycin, tobramycin -zalcitabine Talk to your doctor or health care professional before taking any of these medicines: -acetaminophen -aspirin -ibuprofen -ketoprofen -naproxen This list may not describe all possible interactions. Give your health care provider a list of all the medicines, herbs, non-prescription drugs, or dietary supplements you use. Also tell them if you smoke, drink alcohol, or use illegal drugs. Some items may interact with your medicine. What should I watch for while using this medicine? Your condition will be monitored carefully while you are receiving this medicine. You will need important blood work done while you are taking this medicine. This medicine can make you more sensitive to cold. Do not drink cold drinks or use ice. Cover exposed skin before coming in contact with cold temperatures or cold objects. When out in cold weather wear warm clothing and cover your mouth and nose to warm the air that goes into your lungs. Tell your doctor if you get sensitive to the cold.  This drug may make you feel generally unwell. This is not uncommon, as chemotherapy can affect healthy cells as well as cancer cells. Report any side effects. Continue your course of treatment even though  you feel ill unless your doctor tells you to stop. In some cases, you may be given additional medicines to help with side effects. Follow all directions for their use. Call your doctor or health care professional for advice if you get a fever, chills or sore throat, or other symptoms of a cold or flu. Do not treat yourself. This drug decreases your body's ability to fight infections. Try to avoid being around people who are sick. This medicine may increase your risk to bruise or bleed. Call your doctor or health care professional if you notice any unusual bleeding. Be careful brushing and flossing your teeth or using a toothpick because you may get an infection or bleed more easily. If you have any dental work done, tell your dentist you are receiving this medicine. Avoid taking products that contain aspirin, acetaminophen, ibuprofen, naproxen, or ketoprofen unless instructed by your doctor. These medicines may hide a fever. Do not become pregnant while taking this medicine. Women should inform their doctor if they wish to become pregnant or think they might be pregnant. There is a potential for serious side effects to an unborn child. Talk to your health care professional or pharmacist for more information. Do not breast-feed an infant while taking this medicine. Call your doctor or health care professional if you get diarrhea. Do not treat yourself. What side effects may I notice from receiving this medicine? Side effects that you should report to your doctor or health care professional as soon as possible: -allergic reactions like skin rash, itching or hives, swelling of the face, lips, or tongue -low blood counts - This drug may decrease the number of white blood cells, red blood cells and platelets. You may be at increased risk for infections and bleeding. -signs of infection - fever or chills, cough, sore throat, pain or difficulty passing urine -signs of decreased platelets or bleeding -  bruising, pinpoint red spots on the skin, black, tarry stools, nosebleeds -signs of decreased red blood cells - unusually weak or tired, fainting spells, lightheadedness -breathing problems -chest pain, pressure -cough -diarrhea -jaw tightness -mouth sores -nausea and vomiting -pain, swelling, redness or irritation at the injection site -pain, tingling, numbness in the hands or feet -problems with balance, talking, walking -redness, blistering, peeling or loosening of the skin, including inside the mouth -trouble passing urine or change in the amount of urine Side effects that usually do not require medical attention (report to your doctor or health care professional if they continue or are bothersome): -changes in vision -constipation -hair loss -loss of appetite -metallic taste in the mouth or changes in taste -stomach pain This list may not describe all possible side effects. Call your doctor for medical advice about side effects. You may report side effects to FDA at 1-800-FDA-1088. Where should I keep my medicine? This drug is given in a hospital or clinic and will not be stored at home. NOTE: This sheet is a summary. It may not cover all possible information. If you have questions about this medicine, talk to your doctor, pharmacist, or health care provider.  2017 Elsevier/Gold Standard (2008-07-02 17:22:47)  Leucovorin injection What is this medicine? LEUCOVORIN (loo koe VOR in) is used to prevent or treat the harmful effects of some medicines. This medicine is used to treat  caused by a low amount of folic acid in the body. It is also used with 5-fluorouracil (5-FU) to treat colon cancer. This medicine may be used for other purposes; ask your health care provider or pharmacist if you have questions. What should I tell my health care provider before I take this medicine? They need to know if you have any of these conditions: -anemia from low levels of vitamin B-12 in the  blood -an unusual or allergic reaction to leucovorin, folic acid, other medicines, foods, dyes, or preservatives -pregnant or trying to get pregnant -breast-feeding How should I use this medicine? This medicine is for injection into a muscle or into a vein. It is given by a health care professional in a hospital or clinic setting. Talk to your pediatrician regarding the use of this medicine in children. Special care may be needed. Overdosage: If you think you have taken too much of this medicine contact a poison control center or emergency room at once. NOTE: This medicine is only for you. Do not share this medicine with others. What if I miss a dose? This does not apply. What may interact with this medicine? -capecitabine -fluorouracil -phenobarbital -phenytoin -primidone -trimethoprim-sulfamethoxazole This list may not describe all possible interactions. Give your health care provider a list of all the medicines, herbs, non-prescription drugs, or dietary supplements you use. Also tell them if you smoke, drink alcohol, or use illegal drugs. Some items may interact with your medicine. What should I watch for while using this medicine? Your condition will be monitored carefully while you are receiving this medicine. This medicine may increase the side effects of 5-fluorouracil, 5-FU. Tell your doctor or health care professional if you have diarrhea or mouth sores that do not get better or that get worse. What side effects may I notice from receiving this medicine? Side effects that you should report to your doctor or health care professional as soon as possible: -allergic reactions like skin rash, itching or hives, swelling of the face, lips, or tongue -breathing problems -fever, infection -mouth sores -unusual bleeding or bruising -unusually weak or tired Side effects that usually do not require medical attention (report to your doctor or health care professional if they continue or are  bothersome): -constipation or diarrhea -loss of appetite -nausea, vomiting This list may not describe all possible side effects. Call your doctor for medical advice about side effects. You may report side effects to FDA at 1-800-FDA-1088. Where should I keep my medicine? This drug is given in a hospital or clinic and will not be stored at home. NOTE: This sheet is a summary. It may not cover all possible information. If you have questions about this medicine, talk to your doctor, pharmacist, or health care provider.  2017 Elsevier/Gold Standard (2008-06-11 16:50:29) Fluorouracil, 5-FU injection What is this medicine? FLUOROURACIL, 5-FU (flure oh YOOR a sil) is a chemotherapy drug. It slows the growth of cancer cells. This medicine is used to treat many types of cancer like breast cancer, colon or rectal cancer, pancreatic cancer, and stomach cancer. This medicine may be used for other purposes; ask your health care provider or pharmacist if you have questions. COMMON BRAND NAME(S): Adrucil What should I tell my health care provider before I take this medicine? They need to know if you have any of these conditions: -blood disorders -dihydropyrimidine dehydrogenase (DPD) deficiency -infection (especially a virus infection such as chickenpox, cold sores, or herpes) -kidney disease -liver disease -malnourished, poor nutrition -recent or ongoing radiation   ongoing radiation therapy -an unusual or allergic reaction to fluorouracil, other chemotherapy, other medicines, foods, dyes, or preservatives -pregnant or trying to get pregnant -breast-feeding How should I use this medicine? This drug is given as an infusion or injection into a vein. It is administered in a hospital or clinic by a specially trained health care professional. Talk to your pediatrician regarding the use of this medicine in children. Special care may be needed. Overdosage: If you think you have taken too much of this medicine contact a  poison control center or emergency room at once. NOTE: This medicine is only for you. Do not share this medicine with others. What if I miss a dose? It is important not to miss your dose. Call your doctor or health care professional if you are unable to keep an appointment. What may interact with this medicine? -allopurinol -cimetidine -dapsone -digoxin -hydroxyurea -leucovorin -levamisole -medicines for seizures like ethotoin, fosphenytoin, phenytoin -medicines to increase blood counts like filgrastim, pegfilgrastim, sargramostim -medicines that treat or prevent blood clots like warfarin, enoxaparin, and dalteparin -methotrexate -metronidazole -pyrimethamine -some other chemotherapy drugs like busulfan, cisplatin, estramustine, vinblastine -trimethoprim -trimetrexate -vaccines Talk to your doctor or health care professional before taking any of these medicines: -acetaminophen -aspirin -ibuprofen -ketoprofen -naproxen This list may not describe all possible interactions. Give your health care provider a list of all the medicines, herbs, non-prescription drugs, or dietary supplements you use. Also tell them if you smoke, drink alcohol, or use illegal drugs. Some items may interact with your medicine. What should I watch for while using this medicine? Visit your doctor for checks on your progress. This drug may make you feel generally unwell. This is not uncommon, as chemotherapy can affect healthy cells as well as cancer cells. Report any side effects. Continue your course of treatment even though you feel ill unless your doctor tells you to stop. In some cases, you may be given additional medicines to help with side effects. Follow all directions for their use. Call your doctor or health care professional for advice if you get a fever, chills or sore throat, or other symptoms of a cold or flu. Do not treat yourself. This drug decreases your body's ability to fight infections. Try to  avoid being around people who are sick. This medicine may increase your risk to bruise or bleed. Call your doctor or health care professional if you notice any unusual bleeding. Be careful brushing and flossing your teeth or using a toothpick because you may get an infection or bleed more easily. If you have any dental work done, tell your dentist you are receiving this medicine. Avoid taking products that contain aspirin, acetaminophen, ibuprofen, naproxen, or ketoprofen unless instructed by your doctor. These medicines may hide a fever. Do not become pregnant while taking this medicine. Women should inform their doctor if they wish to become pregnant or think they might be pregnant. There is a potential for serious side effects to an unborn child. Talk to your health care professional or pharmacist for more information. Do not breast-feed an infant while taking this medicine. Men should inform their doctor if they wish to father a child. This medicine may lower sperm counts. Do not treat diarrhea with over the counter products. Contact your doctor if you have diarrhea that lasts more than 2 days or if it is severe and watery. This medicine can make you more sensitive to the sun. Keep out of the sun. If you cannot avoid being in the sun,   clothing and use sunscreen. Do not use sun lamps or tanning beds/booths. What side effects may I notice from receiving this medicine? Side effects that you should report to your doctor or health care professional as soon as possible: -allergic reactions like skin rash, itching or hives, swelling of the face, lips, or tongue -low blood counts - this medicine may decrease the number of white blood cells, red blood cells and platelets. You may be at increased risk for infections and bleeding. -signs of infection - fever or chills, cough, sore throat, pain or difficulty passing urine -signs of decreased platelets or bleeding - bruising, pinpoint red spots on the  skin, black, tarry stools, blood in the urine -signs of decreased red blood cells - unusually weak or tired, fainting spells, lightheadedness -breathing problems -changes in vision -chest pain -mouth sores -nausea and vomiting -pain, swelling, redness at site where injected -pain, tingling, numbness in the hands or feet -redness, swelling, or sores on hands or feet -stomach pain -unusual bleeding Side effects that usually do not require medical attention (report to your doctor or health care professional if they continue or are bothersome): -changes in finger or toe nails -diarrhea -dry or itchy skin -hair loss -headache -loss of appetite -sensitivity of eyes to the light -stomach upset -unusually teary eyes This list may not describe all possible side effects. Call your doctor for medical advice about side effects. You may report side effects to FDA at 1-800-FDA-1088. Where should I keep my medicine? This drug is given in a hospital or clinic and will not be stored at home. NOTE: This sheet is a summary. It may not cover all possible information. If you have questions about this medicine, talk to your doctor, pharmacist, or health care provider.  2017 Elsevier/Gold Standard (2008-04-10 13:53:16)  

## 2017-01-27 NOTE — Progress Notes (Signed)
Per Dr. Burr Medico, pharmacy to help adjusting  5FU chemo pump rate to be completed by 1 pm on Saturday  01/29/17.  Pharmacy notified.

## 2017-01-27 NOTE — Patient Instructions (Signed)

## 2017-01-31 ENCOUNTER — Encounter (HOSPITAL_COMMUNITY): Payer: Self-pay

## 2017-01-31 ENCOUNTER — Ambulatory Visit (HOSPITAL_BASED_OUTPATIENT_CLINIC_OR_DEPARTMENT_OTHER): Payer: Medicaid Other

## 2017-01-31 VITALS — BP 120/87 | HR 80 | Temp 98.4°F | Resp 18

## 2017-01-31 DIAGNOSIS — C20 Malignant neoplasm of rectum: Secondary | ICD-10-CM

## 2017-01-31 MED ORDER — SODIUM CHLORIDE 0.9% FLUSH
10.0000 mL | INTRAVENOUS | Status: DC | PRN
  Administered 2017-01-31: 10 mL
  Filled 2017-01-31: qty 10

## 2017-01-31 MED ORDER — HEPARIN SOD (PORK) LOCK FLUSH 100 UNIT/ML IV SOLN
500.0000 [IU] | Freq: Once | INTRAVENOUS | Status: AC | PRN
  Administered 2017-01-31: 500 [IU]
  Filled 2017-01-31: qty 5

## 2017-01-31 NOTE — Progress Notes (Signed)
Pt pac site c/d/i with some mild redness around pac. Pt just got his port placed last wed. Pt denies any fever, chills, pain at the pac site. Told pt to keep close eye and check temp at least bid. Told pt that he may shower at home. Pt coming back tomorrow for IVF's.

## 2017-02-01 ENCOUNTER — Ambulatory Visit (HOSPITAL_BASED_OUTPATIENT_CLINIC_OR_DEPARTMENT_OTHER): Payer: Medicaid Other

## 2017-02-01 VITALS — BP 120/84 | HR 74 | Temp 98.2°F | Resp 18

## 2017-02-01 DIAGNOSIS — R197 Diarrhea, unspecified: Secondary | ICD-10-CM | POA: Diagnosis present

## 2017-02-01 DIAGNOSIS — C20 Malignant neoplasm of rectum: Secondary | ICD-10-CM

## 2017-02-01 MED ORDER — SODIUM CHLORIDE 0.9% FLUSH
10.0000 mL | INTRAVENOUS | Status: DC | PRN
  Administered 2017-02-01: 10 mL
  Filled 2017-02-01: qty 10

## 2017-02-01 MED ORDER — SODIUM CHLORIDE 0.9 % IV SOLN
Freq: Once | INTRAVENOUS | Status: AC
  Administered 2017-02-01: 13:00:00 via INTRAVENOUS

## 2017-02-01 MED ORDER — HEPARIN SOD (PORK) LOCK FLUSH 100 UNIT/ML IV SOLN
500.0000 [IU] | Freq: Once | INTRAVENOUS | Status: AC | PRN
Start: 2017-02-01 — End: 2017-02-01
  Administered 2017-02-01: 500 [IU]
  Filled 2017-02-01: qty 5

## 2017-02-01 NOTE — Patient Instructions (Signed)
Dehydration, Adult Dehydration is when there is not enough fluid or water in your body. This happens when you lose more fluids than you take in. Dehydration can range from mild to very bad. It should be treated right away to keep it from getting very bad. Symptoms of mild dehydration may include:   Thirst.  Dry lips.  Slightly dry mouth.  Dry, warm skin.  Dizziness. Symptoms of moderate dehydration may include:   Very dry mouth.  Muscle cramps.  Dark pee (urine). Pee may be the color of tea.  Your body making less pee.  Your eyes making fewer tears.  Heartbeat that is uneven or faster than normal (palpitations).  Headache.  Light-headedness, especially when you stand up from sitting.  Fainting (syncope). Symptoms of very bad dehydration may include:   Changes in skin, such as:  Cold and clammy skin.  Blotchy (mottled) or pale skin.  Skin that does not quickly return to normal after being lightly pinched and let go (poor skin turgor).  Changes in body fluids, such as:  Feeling very thirsty.  Your eyes making fewer tears.  Not sweating when body temperature is high, such as in hot weather.  Your body making very little pee.  Changes in vital signs, such as:  Weak pulse.  Pulse that is more than 100 beats a minute when you are sitting still.  Fast breathing.  Low blood pressure.  Other changes, such as:  Sunken eyes.  Cold hands and feet.  Confusion.  Lack of energy (lethargy).  Trouble waking up from sleep.  Short-term weight loss.  Unconsciousness. Follow these instructions at home:  If told by your doctor, drink an ORS:  Make an ORS by using instructions on the package.  Start by drinking small amounts, about  cup (120 mL) every 5-10 minutes.  Slowly drink more until you have had the amount that your doctor said to have.  Drink enough clear fluid to keep your pee clear or pale yellow. If you were told to drink an ORS, finish the  ORS first, then start slowly drinking clear fluids. Drink fluids such as:  Water. Do not drink only water by itself. Doing that can make the salt (sodium) level in your body get too low (hyponatremia).  Ice chips.  Fruit juice that you have added water to (diluted).  Low-calorie sports drinks.  Avoid:  Alcohol.  Drinks that have a lot of sugar. These include high-calorie sports drinks, fruit juice that does not have water added, and soda.  Caffeine.  Foods that are greasy or have a lot of fat or sugar.  Take over-the-counter and prescription medicines only as told by your doctor.  Do not take salt tablets. Doing that can make the salt level in your body get too high (hypernatremia).  Eat foods that have minerals (electrolytes). Examples include bananas, oranges, potatoes, tomatoes, and spinach.  Keep all follow-up visits as told by your doctor. This is important. Contact a doctor if:  You have belly (abdominal) pain that:  Gets worse.  Stays in one area (localizes).  You have a rash.  You have a stiff neck.  You get angry or annoyed more easily than normal (irritability).  You are more sleepy than normal.  You have a harder time waking up than normal.  You feel:  Weak.  Dizzy.  Very thirsty.  You have peed (urinated) only a small amount of very dark pee during 6-8 hours. Get help right away if:  You   have symptoms of very bad dehydration.  You cannot drink fluids without throwing up (vomiting).  Your symptoms get worse with treatment.  You have a fever.  You have a very bad headache.  You are throwing up or having watery poop (diarrhea) and it:  Gets worse.  Does not go away.  You have blood or something green (bile) in your throw-up.  You have blood in your poop (stool). This may cause poop to look black and tarry.  You have not peed in 6-8 hours.  You pass out (faint).  Your heart rate when you are sitting still is more than 100 beats a  minute.  You have trouble breathing. This information is not intended to replace advice given to you by your health care provider. Make sure you discuss any questions you have with your health care provider. Document Released: 10/02/2009 Document Revised: 06/25/2016 Document Reviewed: 01/30/2016 Elsevier Interactive Patient Education  2017 Elsevier Inc.  

## 2017-02-03 ENCOUNTER — Ambulatory Visit (HOSPITAL_BASED_OUTPATIENT_CLINIC_OR_DEPARTMENT_OTHER): Payer: Medicaid Other

## 2017-02-03 VITALS — BP 111/76 | HR 73 | Temp 97.9°F | Resp 18

## 2017-02-03 DIAGNOSIS — C20 Malignant neoplasm of rectum: Secondary | ICD-10-CM

## 2017-02-03 DIAGNOSIS — R197 Diarrhea, unspecified: Secondary | ICD-10-CM

## 2017-02-03 MED ORDER — SODIUM CHLORIDE 0.9% FLUSH
10.0000 mL | INTRAVENOUS | Status: DC | PRN
  Administered 2017-02-03: 10 mL
  Filled 2017-02-03: qty 10

## 2017-02-03 MED ORDER — SODIUM CHLORIDE 0.9 % IV SOLN
Freq: Once | INTRAVENOUS | Status: AC
  Administered 2017-02-03: 14:00:00 via INTRAVENOUS

## 2017-02-03 MED ORDER — HEPARIN SOD (PORK) LOCK FLUSH 100 UNIT/ML IV SOLN
500.0000 [IU] | Freq: Once | INTRAVENOUS | Status: AC | PRN
  Administered 2017-02-03: 500 [IU]
  Filled 2017-02-03: qty 5

## 2017-02-03 MED ORDER — HEPARIN SOD (PORK) LOCK FLUSH 100 UNIT/ML IV SOLN
250.0000 [IU] | Freq: Once | INTRAVENOUS | Status: AC | PRN
  Filled 2017-02-03: qty 5

## 2017-02-03 NOTE — Patient Instructions (Signed)
Dehydration, Adult Dehydration is when there is not enough fluid or water in your body. This happens when you lose more fluids than you take in. Dehydration can range from mild to very bad. It should be treated right away to keep it from getting very bad. Symptoms of mild dehydration may include:   Thirst.  Dry lips.  Slightly dry mouth.  Dry, warm skin.  Dizziness. Symptoms of moderate dehydration may include:   Very dry mouth.  Muscle cramps.  Dark pee (urine). Pee may be the color of tea.  Your body making less pee.  Your eyes making fewer tears.  Heartbeat that is uneven or faster than normal (palpitations).  Headache.  Light-headedness, especially when you stand up from sitting.  Fainting (syncope). Symptoms of very bad dehydration may include:   Changes in skin, such as:  Cold and clammy skin.  Blotchy (mottled) or pale skin.  Skin that does not quickly return to normal after being lightly pinched and let go (poor skin turgor).  Changes in body fluids, such as:  Feeling very thirsty.  Your eyes making fewer tears.  Not sweating when body temperature is high, such as in hot weather.  Your body making very little pee.  Changes in vital signs, such as:  Weak pulse.  Pulse that is more than 100 beats a minute when you are sitting still.  Fast breathing.  Low blood pressure.  Other changes, such as:  Sunken eyes.  Cold hands and feet.  Confusion.  Lack of energy (lethargy).  Trouble waking up from sleep.  Short-term weight loss.  Unconsciousness. Follow these instructions at home:  If told by your doctor, drink an ORS:  Make an ORS by using instructions on the package.  Start by drinking small amounts, about  cup (120 mL) every 5-10 minutes.  Slowly drink more until you have had the amount that your doctor said to have.  Drink enough clear fluid to keep your pee clear or pale yellow. If you were told to drink an ORS, finish the  ORS first, then start slowly drinking clear fluids. Drink fluids such as:  Water. Do not drink only water by itself. Doing that can make the salt (sodium) level in your body get too low (hyponatremia).  Ice chips.  Fruit juice that you have added water to (diluted).  Low-calorie sports drinks.  Avoid:  Alcohol.  Drinks that have a lot of sugar. These include high-calorie sports drinks, fruit juice that does not have water added, and soda.  Caffeine.  Foods that are greasy or have a lot of fat or sugar.  Take over-the-counter and prescription medicines only as told by your doctor.  Do not take salt tablets. Doing that can make the salt level in your body get too high (hypernatremia).  Eat foods that have minerals (electrolytes). Examples include bananas, oranges, potatoes, tomatoes, and spinach.  Keep all follow-up visits as told by your doctor. This is important. Contact a doctor if:  You have belly (abdominal) pain that:  Gets worse.  Stays in one area (localizes).  You have a rash.  You have a stiff neck.  You get angry or annoyed more easily than normal (irritability).  You are more sleepy than normal.  You have a harder time waking up than normal.  You feel:  Weak.  Dizzy.  Very thirsty.  You have peed (urinated) only a small amount of very dark pee during 6-8 hours. Get help right away if:  You   have symptoms of very bad dehydration.  You cannot drink fluids without throwing up (vomiting).  Your symptoms get worse with treatment.  You have a fever.  You have a very bad headache.  You are throwing up or having watery poop (diarrhea) and it:  Gets worse.  Does not go away.  You have blood or something green (bile) in your throw-up.  You have blood in your poop (stool). This may cause poop to look black and tarry.  You have not peed in 6-8 hours.  You pass out (faint).  Your heart rate when you are sitting still is more than 100 beats a  minute.  You have trouble breathing. This information is not intended to replace advice given to you by your health care provider. Make sure you discuss any questions you have with your health care provider. Document Released: 10/02/2009 Document Revised: 06/25/2016 Document Reviewed: 01/30/2016 Elsevier Interactive Patient Education  2017 Elsevier Inc.  

## 2017-02-10 ENCOUNTER — Other Ambulatory Visit (HOSPITAL_BASED_OUTPATIENT_CLINIC_OR_DEPARTMENT_OTHER): Payer: Medicaid Other

## 2017-02-10 ENCOUNTER — Other Ambulatory Visit: Payer: Self-pay | Admitting: Hematology

## 2017-02-10 ENCOUNTER — Ambulatory Visit (HOSPITAL_BASED_OUTPATIENT_CLINIC_OR_DEPARTMENT_OTHER): Payer: Medicaid Other

## 2017-02-10 DIAGNOSIS — C20 Malignant neoplasm of rectum: Secondary | ICD-10-CM

## 2017-02-10 DIAGNOSIS — Z5111 Encounter for antineoplastic chemotherapy: Secondary | ICD-10-CM | POA: Diagnosis present

## 2017-02-10 LAB — CBC WITH DIFFERENTIAL/PLATELET
BASO%: 0.6 % (ref 0.0–2.0)
Basophils Absolute: 0 10*3/uL (ref 0.0–0.1)
EOS%: 2.2 % (ref 0.0–7.0)
Eosinophils Absolute: 0.1 10*3/uL (ref 0.0–0.5)
HCT: 39.8 % (ref 38.4–49.9)
HGB: 13.4 g/dL (ref 13.0–17.1)
LYMPH#: 0.5 10*3/uL — AB (ref 0.9–3.3)
LYMPH%: 10.7 % — AB (ref 14.0–49.0)
MCH: 28 pg (ref 27.2–33.4)
MCHC: 33.7 g/dL (ref 32.0–36.0)
MCV: 83.1 fL (ref 79.3–98.0)
MONO#: 0.5 10*3/uL (ref 0.1–0.9)
MONO%: 9.1 % (ref 0.0–14.0)
NEUT%: 77.4 % — AB (ref 39.0–75.0)
NEUTROS ABS: 3.9 10*3/uL (ref 1.5–6.5)
Platelets: 138 10*3/uL — ABNORMAL LOW (ref 140–400)
RBC: 4.79 10*6/uL (ref 4.20–5.82)
RDW: 15.4 % — ABNORMAL HIGH (ref 11.0–14.6)
WBC: 5.1 10*3/uL (ref 4.0–10.3)
nRBC: 0 % (ref 0–0)

## 2017-02-10 LAB — COMPREHENSIVE METABOLIC PANEL
ALBUMIN: 2.4 g/dL — AB (ref 3.5–5.0)
ALK PHOS: 52 U/L (ref 40–150)
ALT: 14 U/L (ref 0–55)
AST: 19 U/L (ref 5–34)
Anion Gap: 6 mEq/L (ref 3–11)
BUN: 8.4 mg/dL (ref 7.0–26.0)
CO2: 23 meq/L (ref 22–29)
Calcium: 8.4 mg/dL (ref 8.4–10.4)
Chloride: 111 mEq/L — ABNORMAL HIGH (ref 98–109)
Creatinine: 1.1 mg/dL (ref 0.7–1.3)
EGFR: 84 mL/min/{1.73_m2} — AB (ref >90)
Glucose: 137 mg/dl (ref 70–140)
POTASSIUM: 3.6 meq/L (ref 3.5–5.1)
SODIUM: 140 meq/L (ref 136–145)
TOTAL PROTEIN: 4.7 g/dL — AB (ref 6.4–8.3)
Total Bilirubin: 0.41 mg/dL (ref 0.20–1.20)

## 2017-02-10 MED ORDER — DEXAMETHASONE SODIUM PHOSPHATE 10 MG/ML IJ SOLN
10.0000 mg | Freq: Once | INTRAMUSCULAR | Status: AC
  Administered 2017-02-10: 10 mg via INTRAVENOUS

## 2017-02-10 MED ORDER — FLUOROURACIL CHEMO INJECTION 2.5 GM/50ML
400.0000 mg/m2 | Freq: Once | INTRAVENOUS | Status: AC
  Administered 2017-02-10: 750 mg via INTRAVENOUS
  Filled 2017-02-10: qty 15

## 2017-02-10 MED ORDER — SODIUM CHLORIDE 0.9 % IV SOLN
2400.0000 mg/m2 | INTRAVENOUS | Status: DC
  Administered 2017-02-10: 4350 mg via INTRAVENOUS
  Filled 2017-02-10: qty 87

## 2017-02-10 MED ORDER — SODIUM CHLORIDE 0.9 % IV SOLN
Freq: Once | INTRAVENOUS | Status: AC
  Administered 2017-02-10: 11:00:00 via INTRAVENOUS

## 2017-02-10 MED ORDER — DEXTROSE 5 % IV SOLN
Freq: Once | INTRAVENOUS | Status: AC
  Administered 2017-02-10: 12:00:00 via INTRAVENOUS

## 2017-02-10 MED ORDER — OXALIPLATIN CHEMO INJECTION 100 MG/20ML
83.0000 mg/m2 | Freq: Once | INTRAVENOUS | Status: AC
  Administered 2017-02-10: 150 mg via INTRAVENOUS
  Filled 2017-02-10: qty 10

## 2017-02-10 MED ORDER — DEXAMETHASONE SODIUM PHOSPHATE 10 MG/ML IJ SOLN
INTRAMUSCULAR | Status: AC
  Filled 2017-02-10: qty 1

## 2017-02-10 MED ORDER — PALONOSETRON HCL INJECTION 0.25 MG/5ML
0.2500 mg | Freq: Once | INTRAVENOUS | Status: AC
  Administered 2017-02-10: 0.25 mg via INTRAVENOUS

## 2017-02-10 MED ORDER — LEUCOVORIN CALCIUM INJECTION 350 MG
400.0000 mg/m2 | Freq: Once | INTRAVENOUS | Status: AC
  Administered 2017-02-10: 728 mg via INTRAVENOUS
  Filled 2017-02-10: qty 36.4

## 2017-02-10 MED ORDER — PALONOSETRON HCL INJECTION 0.25 MG/5ML
INTRAVENOUS | Status: AC
  Filled 2017-02-10: qty 5

## 2017-02-10 NOTE — Patient Instructions (Signed)
[facility] Discharge Instructions for Patients Receiving Chemotherapy  Today you received the following chemotherapy agents: oxaliplatin/leucovirn/florouracil.  To help prevent nausea and vomiting after your treatment, we encourage you to take your nausea medication as directed.     If you develop nausea and vomiting that is not controlled by your nausea medication, call the clinic.   BELOW ARE SYMPTOMS THAT SHOULD BE REPORTED IMMEDIATELY:  *FEVER GREATER THAN 100.5 F  *CHILLS WITH OR WITHOUT FEVER  NAUSEA AND VOMITING THAT IS NOT CONTROLLED WITH YOUR NAUSEA MEDICATION  *UNUSUAL SHORTNESS OF BREATH  *UNUSUAL BRUISING OR BLEEDING  TENDERNESS IN MOUTH AND THROAT WITH OR WITHOUT PRESENCE OF ULCERS  *URINARY PROBLEMS  *BOWEL PROBLEMS  UNUSUAL RASH Items with * indicate a potential emergency and should be followed up as soon as possible.  Feel free to call the clinic you have any questions or concerns. The clinic phone number is [phone].   

## 2017-02-12 ENCOUNTER — Ambulatory Visit (HOSPITAL_BASED_OUTPATIENT_CLINIC_OR_DEPARTMENT_OTHER): Payer: Medicaid Other

## 2017-02-12 VITALS — BP 103/78 | HR 62 | Temp 97.1°F | Resp 18

## 2017-02-12 DIAGNOSIS — C20 Malignant neoplasm of rectum: Secondary | ICD-10-CM

## 2017-02-12 MED ORDER — SODIUM CHLORIDE 0.9% FLUSH
10.0000 mL | INTRAVENOUS | Status: DC | PRN
  Administered 2017-02-12: 10 mL
  Filled 2017-02-12: qty 10

## 2017-02-12 MED ORDER — SODIUM CHLORIDE 0.9 % IV SOLN
Freq: Once | INTRAVENOUS | Status: AC
  Administered 2017-02-12: 09:00:00 via INTRAVENOUS

## 2017-02-12 MED ORDER — HEPARIN SOD (PORK) LOCK FLUSH 100 UNIT/ML IV SOLN
500.0000 [IU] | Freq: Once | INTRAVENOUS | Status: AC | PRN
  Administered 2017-02-12: 500 [IU]
  Filled 2017-02-12: qty 5

## 2017-02-12 NOTE — Patient Instructions (Signed)
Dehydration, Adult Dehydration is a condition in which there is not enough fluid or water in the body. This happens when you lose more fluids than you take in. Important organs, such as the kidneys, brain, and heart, cannot function without a proper amount of fluids. Any loss of fluids from the body can lead to dehydration. Dehydration can range from mild to severe. This condition should be treated right away to prevent it from becoming severe. What are the causes? This condition may be caused by:  Vomiting.  Diarrhea.  Excessive sweating, such as from heat exposure or exercise.  Not drinking enough fluid, especially:  When ill.  While doing activity that requires a lot of energy.  Excessive urination.  Fever.  Infection.  Certain medicines, such as medicines that cause the body to lose excess fluid (diuretics).  Inability to access safe drinking water.  Reduced physical ability to get adequate water and food. What increases the risk? This condition is more likely to develop in people:  Who have a poorly controlled long-term (chronic) illness, such as diabetes, heart disease, or kidney disease.  Who are age 65 or older.  Who are disabled.  Who live in a place with high altitude.  Who play endurance sports. What are the signs or symptoms? Symptoms of mild dehydration may include:   Thirst.  Dry lips.  Slightly dry mouth.  Dry, warm skin.  Dizziness. Symptoms of moderate dehydration may include:   Very dry mouth.  Muscle cramps.  Dark urine. Urine may be the color of tea.  Decreased urine production.  Decreased tear production.  Heartbeat that is irregular or faster than normal (palpitations).  Headache.  Light-headedness, especially when you stand up from a sitting position.  Fainting (syncope). Symptoms of severe dehydration may include:   Changes in skin, such as:  Cold and clammy skin.  Blotchy (mottled) or pale skin.  Skin that does  not quickly return to normal after being lightly pinched and released (poor skin turgor).  Changes in body fluids, such as:  Extreme thirst.  No tear production.  Inability to sweat when body temperature is high, such as in hot weather.  Very little urine production.  Changes in vital signs, such as:  Weak pulse.  Pulse that is more than 100 beats a minute when sitting still.  Rapid breathing.  Low blood pressure.  Other changes, such as:  Sunken eyes.  Cold hands and feet.  Confusion.  Lack of energy (lethargy).  Difficulty waking up from sleep.  Short-term weight loss.  Unconsciousness. How is this diagnosed? This condition is diagnosed based on your symptoms and a physical exam. Blood and urine tests may be done to help confirm the diagnosis. How is this treated? Treatment for this condition depends on the severity. Mild or moderate dehydration can often be treated at home. Treatment should be started right away. Do not wait until dehydration becomes severe. Severe dehydration is an emergency and it needs to be treated in a hospital. Treatment for mild dehydration may include:   Drinking more fluids.  Replacing salts and minerals in your blood (electrolytes) that you may have lost. Treatment for moderate dehydration may include:   Drinking an oral rehydration solution (ORS). This is a drink that helps you replace fluids and electrolytes (rehydrate). It can be found at pharmacies and retail stores. Treatment for severe dehydration may include:   Receiving fluids through an IV tube.  Receiving an electrolyte solution through a feeding tube that is   passed through your nose and into your stomach (nasogastric tube, or NG tube).  Correcting any abnormalities in electrolytes.  Treating the underlying cause of dehydration. Follow these instructions at home:  If directed by your health care provider, drink an ORS:  Make an ORS by following instructions on the  package.  Start by drinking small amounts, about  cup (120 mL) every 5-10 minutes.  Slowly increase how much you drink until you have taken the amount recommended by your health care provider.  Drink enough clear fluid to keep your urine clear or pale yellow. If you were told to drink an ORS, finish the ORS first, then start slowly drinking other clear fluids. Drink fluids such as:  Water. Do not drink only water. Doing that can lead to having too little salt (sodium) in the body (hyponatremia).  Ice chips.  Fruit juice that you have added water to (diluted fruit juice).  Low-calorie sports drinks.  Avoid:  Alcohol.  Drinks that contain a lot of sugar. These include high-calorie sports drinks, fruit juice that is not diluted, and soda.  Caffeine.  Foods that are greasy or contain a lot of fat or sugar.  Take over-the-counter and prescription medicines only as told by your health care provider.  Do not take sodium tablets. This can lead to having too much sodium in the body (hypernatremia).  Eat foods that contain a healthy balance of electrolytes, such as bananas, oranges, potatoes, tomatoes, and spinach.  Keep all follow-up visits as told by your health care provider. This is important. Contact a health care provider if:  You have abdominal pain that:  Gets worse.  Stays in one area (localizes).  You have a rash.  You have a stiff neck.  You are more irritable than usual.  You are sleepier or more difficult to wake up than usual.  You feel weak or dizzy.  You feel very thirsty.  You have urinated only a small amount of very dark urine over 6-8 hours. Get help right away if:  You have symptoms of severe dehydration.  You cannot drink fluids without vomiting.  Your symptoms get worse with treatment.  You have a fever.  You have a severe headache.  You have vomiting or diarrhea that:  Gets worse.  Does not go away.  You have blood or green matter  (bile) in your vomit.  You have blood in your stool. This may cause stool to look black and tarry.  You have not urinated in 6-8 hours.  You faint.  Your heart rate while sitting still is over 100 beats a minute.  You have trouble breathing. This information is not intended to replace advice given to you by your health care provider. Make sure you discuss any questions you have with your health care provider. Document Released: 12/06/2005 Document Revised: 07/02/2016 Document Reviewed: 01/30/2016 Elsevier Interactive Patient Education  2017 [employer]  

## 2017-02-15 ENCOUNTER — Ambulatory Visit (HOSPITAL_BASED_OUTPATIENT_CLINIC_OR_DEPARTMENT_OTHER): Payer: Medicaid Other | Admitting: Nurse Practitioner

## 2017-02-15 VITALS — BP 104/72 | HR 77 | Temp 97.7°F | Resp 17 | Ht 71.0 in | Wt 136.4 lb

## 2017-02-15 DIAGNOSIS — G4489 Other headache syndrome: Secondary | ICD-10-CM

## 2017-02-15 DIAGNOSIS — R197 Diarrhea, unspecified: Secondary | ICD-10-CM

## 2017-02-15 DIAGNOSIS — C20 Malignant neoplasm of rectum: Secondary | ICD-10-CM | POA: Diagnosis not present

## 2017-02-15 MED ORDER — HEPARIN SOD (PORK) LOCK FLUSH 100 UNIT/ML IV SOLN
500.0000 [IU] | Freq: Once | INTRAVENOUS | Status: AC | PRN
  Administered 2017-02-15: 500 [IU]
  Filled 2017-02-15: qty 5

## 2017-02-15 MED ORDER — SODIUM CHLORIDE 0.9% FLUSH
10.0000 mL | INTRAVENOUS | Status: DC | PRN
  Administered 2017-02-15: 10 mL
  Filled 2017-02-15: qty 10

## 2017-02-15 MED ORDER — ACETAMINOPHEN 325 MG PO TABS
ORAL_TABLET | ORAL | Status: AC
  Filled 2017-02-15: qty 2

## 2017-02-15 MED ORDER — ACETAMINOPHEN 325 MG PO TABS
650.0000 mg | ORAL_TABLET | Freq: Once | ORAL | Status: AC
  Administered 2017-02-15: 650 mg via ORAL

## 2017-02-15 MED ORDER — SODIUM CHLORIDE 0.9 % IV SOLN
Freq: Once | INTRAVENOUS | Status: AC
  Administered 2017-02-15: 15:00:00 via INTRAVENOUS

## 2017-02-15 NOTE — Patient Instructions (Signed)
Dehydration, Adult Dehydration is a condition in which there is not enough fluid or water in the body. This happens when you lose more fluids than you take in. Important organs, such as the kidneys, brain, and heart, cannot function without a proper amount of fluids. Any loss of fluids from the body can lead to dehydration. Dehydration can range from mild to severe. This condition should be treated right away to prevent it from becoming severe. What are the causes? This condition may be caused by:  Vomiting.  Diarrhea.  Excessive sweating, such as from heat exposure or exercise.  Not drinking enough fluid, especially:  When ill.  While doing activity that requires a lot of energy.  Excessive urination.  Fever.  Infection.  Certain medicines, such as medicines that cause the body to lose excess fluid (diuretics).  Inability to access safe drinking water.  Reduced physical ability to get adequate water and food. What increases the risk? This condition is more likely to develop in people:  Who have a poorly controlled long-term (chronic) illness, such as diabetes, heart disease, or kidney disease.  Who are age 65 or older.  Who are disabled.  Who live in a place with high altitude.  Who play endurance sports. What are the signs or symptoms? Symptoms of mild dehydration may include:   Thirst.  Dry lips.  Slightly dry mouth.  Dry, warm skin.  Dizziness. Symptoms of moderate dehydration may include:   Very dry mouth.  Muscle cramps.  Dark urine. Urine may be the color of tea.  Decreased urine production.  Decreased tear production.  Heartbeat that is irregular or faster than normal (palpitations).  Headache.  Light-headedness, especially when you stand up from a sitting position.  Fainting (syncope). Symptoms of severe dehydration may include:   Changes in skin, such as:  Cold and clammy skin.  Blotchy (mottled) or pale skin.  Skin that does  not quickly return to normal after being lightly pinched and released (poor skin turgor).  Changes in body fluids, such as:  Extreme thirst.  No tear production.  Inability to sweat when body temperature is high, such as in hot weather.  Very little urine production.  Changes in vital signs, such as:  Weak pulse.  Pulse that is more than 100 beats a minute when sitting still.  Rapid breathing.  Low blood pressure.  Other changes, such as:  Sunken eyes.  Cold hands and feet.  Confusion.  Lack of energy (lethargy).  Difficulty waking up from sleep.  Short-term weight loss.  Unconsciousness. How is this diagnosed? This condition is diagnosed based on your symptoms and a physical exam. Blood and urine tests may be done to help confirm the diagnosis. How is this treated? Treatment for this condition depends on the severity. Mild or moderate dehydration can often be treated at home. Treatment should be started right away. Do not wait until dehydration becomes severe. Severe dehydration is an emergency and it needs to be treated in a hospital. Treatment for mild dehydration may include:   Drinking more fluids.  Replacing salts and minerals in your blood (electrolytes) that you may have lost. Treatment for moderate dehydration may include:   Drinking an oral rehydration solution (ORS). This is a drink that helps you replace fluids and electrolytes (rehydrate). It can be found at pharmacies and retail stores. Treatment for severe dehydration may include:   Receiving fluids through an IV tube.  Receiving an electrolyte solution through a feeding tube that is   passed through your nose and into your stomach (nasogastric tube, or NG tube).  Correcting any abnormalities in electrolytes.  Treating the underlying cause of dehydration. Follow these instructions at home:  If directed by your health care provider, drink an ORS:  Make an ORS by following instructions on the  package.  Start by drinking small amounts, about  cup (120 mL) every 5-10 minutes.  Slowly increase how much you drink until you have taken the amount recommended by your health care provider.  Drink enough clear fluid to keep your urine clear or pale yellow. If you were told to drink an ORS, finish the ORS first, then start slowly drinking other clear fluids. Drink fluids such as:  Water. Do not drink only water. Doing that can lead to having too little salt (sodium) in the body (hyponatremia).  Ice chips.  Fruit juice that you have added water to (diluted fruit juice).  Low-calorie sports drinks.  Avoid:  Alcohol.  Drinks that contain a lot of sugar. These include high-calorie sports drinks, fruit juice that is not diluted, and soda.  Caffeine.  Foods that are greasy or contain a lot of fat or sugar.  Take over-the-counter and prescription medicines only as told by your health care provider.  Do not take sodium tablets. This can lead to having too much sodium in the body (hypernatremia).  Eat foods that contain a healthy balance of electrolytes, such as bananas, oranges, potatoes, tomatoes, and spinach.  Keep all follow-up visits as told by your health care provider. This is important. Contact a health care provider if:  You have abdominal pain that:  Gets worse.  Stays in one area (localizes).  You have a rash.  You have a stiff neck.  You are more irritable than usual.  You are sleepier or more difficult to wake up than usual.  You feel weak or dizzy.  You feel very thirsty.  You have urinated only a small amount of very dark urine over 6-8 hours. Get help right away if:  You have symptoms of severe dehydration.  You cannot drink fluids without vomiting.  Your symptoms get worse with treatment.  You have a fever.  You have a severe headache.  You have vomiting or diarrhea that:  Gets worse.  Does not go away.  You have blood or green matter  (bile) in your vomit.  You have blood in your stool. This may cause stool to look black and tarry.  You have not urinated in 6-8 hours.  You faint.  Your heart rate while sitting still is over 100 beats a minute.  You have trouble breathing. This information is not intended to replace advice given to you by your health care provider. Make sure you discuss any questions you have with your health care provider. Document Released: 12/06/2005 Document Revised: 07/02/2016 Document Reviewed: 01/30/2016 Elsevier Interactive Patient Education  2017 [employer]  

## 2017-02-15 NOTE — Progress Notes (Signed)
C/o headache. Rated @ a 5. Received order from Dr. Burr Medico for tylenol 650 mg.  Pt took and resting after.  H/a better after rest and tylenol/IV fluids.  RN visit only for IVF

## 2017-02-16 ENCOUNTER — Telehealth: Payer: Self-pay | Admitting: *Deleted

## 2017-02-16 NOTE — Telephone Encounter (Signed)
[name] called requesting that [name] be seen sometime - maybe with his next IVF appointment which is tomorrow.  He is emptying his bag 7-8X day.  Discussed with Dr. Burr Medico.  She will see him tomorrow at 12:45 before his IVF.  Spoke with [name].  She asked could he be seen on Friday.  Let her know that it would be better to have him seen tomorrow.  She asked if I thought his cancer had come back?  Let her know that Dr. Burr Medico needs to assess him.  Requested that they come at 12:30pm for a 12:45pm appt to see Dr. Burr Medico before his IVF at 1:15pm.  She is agreeable.

## 2017-02-17 ENCOUNTER — Ambulatory Visit (HOSPITAL_BASED_OUTPATIENT_CLINIC_OR_DEPARTMENT_OTHER): Payer: Medicaid Other

## 2017-02-17 ENCOUNTER — Ambulatory Visit (HOSPITAL_BASED_OUTPATIENT_CLINIC_OR_DEPARTMENT_OTHER): Payer: Medicaid Other | Admitting: Hematology

## 2017-02-17 ENCOUNTER — Encounter: Payer: Self-pay | Admitting: Hematology

## 2017-02-17 ENCOUNTER — Telehealth: Payer: Self-pay | Admitting: Hematology

## 2017-02-17 VITALS — BP 111/87 | HR 110 | Temp 97.8°F | Resp 18 | Ht 71.0 in | Wt 134.8 lb

## 2017-02-17 VITALS — BP 102/86 | HR 71 | Temp 97.8°F | Resp 18

## 2017-02-17 DIAGNOSIS — K529 Noninfective gastroenteritis and colitis, unspecified: Secondary | ICD-10-CM | POA: Insufficient documentation

## 2017-02-17 DIAGNOSIS — R197 Diarrhea, unspecified: Secondary | ICD-10-CM

## 2017-02-17 DIAGNOSIS — C20 Malignant neoplasm of rectum: Secondary | ICD-10-CM

## 2017-02-17 DIAGNOSIS — R51 Headache: Secondary | ICD-10-CM | POA: Diagnosis not present

## 2017-02-17 DIAGNOSIS — F419 Anxiety disorder, unspecified: Secondary | ICD-10-CM | POA: Diagnosis not present

## 2017-02-17 DIAGNOSIS — Z1509 Genetic susceptibility to other malignant neoplasm: Secondary | ICD-10-CM | POA: Diagnosis not present

## 2017-02-17 LAB — COMPREHENSIVE METABOLIC PANEL
ALBUMIN: 2.9 g/dL — AB (ref 3.5–5.0)
ALK PHOS: 64 U/L (ref 40–150)
ALT: 15 U/L (ref 0–55)
AST: 24 U/L (ref 5–34)
Anion Gap: 4 mEq/L (ref 3–11)
BUN: 11.8 mg/dL (ref 7.0–26.0)
CALCIUM: 8.5 mg/dL (ref 8.4–10.4)
CHLORIDE: 111 meq/L — AB (ref 98–109)
CO2: 20 mEq/L — ABNORMAL LOW (ref 22–29)
CREATININE: 1 mg/dL (ref 0.7–1.3)
EGFR: 88 mL/min/{1.73_m2} — ABNORMAL LOW (ref >90)
Glucose: 121 mg/dl (ref 70–140)
Potassium: 4.6 mEq/L (ref 3.5–5.1)
Sodium: 134 mEq/L — ABNORMAL LOW (ref 136–145)
Total Bilirubin: 0.31 mg/dL (ref 0.20–1.20)
Total Protein: 5.7 g/dL — ABNORMAL LOW (ref 6.4–8.3)

## 2017-02-17 LAB — CBC WITH DIFFERENTIAL/PLATELET
BASO%: 1.3 % (ref 0.0–2.0)
Basophils Absolute: 0 10*3/uL (ref 0.0–0.1)
EOS%: 2.5 % (ref 0.0–7.0)
Eosinophils Absolute: 0.1 10*3/uL (ref 0.0–0.5)
HEMATOCRIT: 46 % (ref 38.4–49.9)
HEMOGLOBIN: 15.2 g/dL (ref 13.0–17.1)
LYMPH#: 0.7 10*3/uL — AB (ref 0.9–3.3)
LYMPH%: 19.8 % (ref 14.0–49.0)
MCH: 28 pg (ref 27.2–33.4)
MCHC: 33.2 g/dL (ref 32.0–36.0)
MCV: 84.5 fL (ref 79.3–98.0)
MONO#: 0.5 10*3/uL (ref 0.1–0.9)
MONO%: 15 % — ABNORMAL HIGH (ref 0.0–14.0)
NEUT%: 61.4 % (ref 39.0–75.0)
NEUTROS ABS: 2.1 10*3/uL (ref 1.5–6.5)
Platelets: 194 10*3/uL (ref 140–400)
RBC: 5.44 10*6/uL (ref 4.20–5.82)
RDW: 15.7 % — AB (ref 11.0–14.6)
WBC: 3.4 10*3/uL — ABNORMAL LOW (ref 4.0–10.3)

## 2017-02-17 MED ORDER — OXYCODONE HCL 5 MG PO TABS: 5.0000 mg | ORAL_TABLET | Freq: Four times a day (QID) | ORAL | 0 refills | Status: DC | PRN

## 2017-02-17 MED ORDER — HEPARIN SOD (PORK) LOCK FLUSH 100 UNIT/ML IV SOLN
500.0000 [IU] | Freq: Once | INTRAVENOUS | Status: AC | PRN
Start: 2017-02-17 — End: 2017-02-17
  Administered 2017-02-17: 500 [IU]
  Filled 2017-02-17: qty 5

## 2017-02-17 MED ORDER — ALPRAZOLAM 0.5 MG PO TABS: 0.2500 mg | ORAL_TABLET | Freq: Every evening | ORAL | 0 refills | Status: DC | PRN

## 2017-02-17 MED ORDER — SODIUM CHLORIDE 0.9 % IV SOLN
Freq: Once | INTRAVENOUS | Status: AC
  Administered 2017-02-17: 14:00:00 via INTRAVENOUS

## 2017-02-17 MED ORDER — SODIUM CHLORIDE 0.9% FLUSH
10.0000 mL | INTRAVENOUS | Status: DC | PRN
Start: 2017-02-17 — End: 2017-02-17
  Administered 2017-02-17: 10 mL
  Filled 2017-02-17: qty 10

## 2017-02-17 NOTE — Telephone Encounter (Signed)
Appointments scheduled per 3/1 LOS.  Patient given AVS report and calendars with future scheduled appointments. °

## 2017-02-17 NOTE — Progress Notes (Signed)
[place]  [phone] [phone]  Clinic follow Up Note   Patient Care Team: [name] as PCP - General (General Practice) Tania Ade, RN as Registered Nurse Michael Boston, MD as Consulting Physician (General Surgery) Doran Stabler, MD as Consulting Physician (Gastroenterology) Kyung Rudd, MD as Consulting Physician (Radiation Oncology) Truitt Merle, MD as Consulting Physician (Oncology) 02/17/2017  CHIEF COMPLAINTS:  Follow up rectal cancer  Oncology History   Rectal adenocarcinoma [name])   Staging form: Colon and Rectum, AJCC 7th Edition   - Clinical stage from 07/28/2016: Stage IIIB (T3, N2a, M0) - Signed by Truitt Merle, MD on 08/06/2016      Rectal adenocarcinoma s/p protctocolectomy, IPAA "J" pouch 12/01/2016   07/26/2016 Imaging    CT chest, abdomen and pelvis with contrast showed nodular thickening of the rectal wall, enlarged perirectal lymph nodes. Diverticulosis. Nonspecific low-level lymphadenopathy. Lower lobe emphysema, small 3-4 mm left lower lobe lung nodules.      07/28/2016 Initial Diagnosis    Rectal adenocarcinoma ([place])      07/28/2016 Initial Biopsy    Rectal mass biopsy showed invasive adenocarcinoma, polyps in the ascending colon and rectum showed tubular adenoma       07/28/2016 Procedure    Colonoscopy showed a nearly circumferential mass in the distal rectum, 4 polyps in the descending colon, diverticulosis in the left colon.       07/29/2016 Procedure    Low EUS showed clearly malignant 6 cm long, nearly circumferential mass in the distal rectum, with distal edge located to 1 cm from the annual approach, multiple prior rectal lymph nodes (6) are suspicious ,uT3 N2       08/17/2016 - 09/24/2016 Radiation Therapy    Neoadjuvant radiation to his rectal cancer 1) Rectum/ 45 Gy in 25 fx                      2) Boost/ 5.4 Gy in 3 fx       08/17/2016 - 09/24/2016 Chemotherapy    Xeloda 1500 mg twice daily with  concurrent irradiation      08/18/2016 Imaging    PET scan showed hypermetabolic rectal mass, perirectal node 52m with mild increased uptake, no hypermetabolic distant metastasis, small pulmonary nodules are too small to characterize by PET.       12/01/2016 Surgery    Colon total resection and proctocolectomy for rectal cancer and Lynch syndrome       12/01/2016 Pathology Results    Proctocolectomy showed scattered small residual foci of invasive moderately differentiated adenocarcinoma embedded in submucosa tissue with extensive neoadjuvant effect, 2.5 cm in greatest time mention, adenocarcinoma invades through muscularis mucosa and involves subserosal soft tissue, margins are negative, 2 of 35 lymph nodes are positive for metastatic adenocarcinoma, 2 calcified and necrotic soft tissue nodules are also present without visible tumor seen.      01/07/2017 -  Chemotherapy    Adjuvant chemo CAPOX, changed to FOLFOX from cycle 2 due to poor tolerance         HISTORY OF PRESENTING ILLNESS:  [name]46y.o. male is here because of His newly diagnosed rectal cancer. He is accompanied by his girlfriend to our multidisciplinary GI clinic today.  He has had rectal pain for 6 weeks, and mild rectal bleeding with BM, he goes 4 times a day, very little each time, and pencil shaped stool, he has lost 30 lbs so far. His  appettie remains to be good, he eats well, no cough, nausea, no abd pain. He has a moderate fatigue, he will 2 tolerate his routine activities including his job without much difficulty. He does not have a primary care physician, and has not seen doctors for many years.  He presents to emergency room on 07/26/2016 for the rectal pain and bleeding, and was referred to gastroenterologist Dr. Loletha Carrow. He underwent colonoscopy on 07/28/2016, which showed 4 polyps in the ascending colon, and a circumferential distal rectal mass. Biopsy of the rectal mass showed adenocarcinoma. He underwent  EUS by Dr. Ardis Hughs on 07/29/2016 which showed a T3 N2 disease.   CURRENT THERAPY: adjuvant chemo CAPOX, will change to FOLFOX from cycle 2   INTERIM HISTORY  [name] returns for follow-up and to be seen before IVF. The patient is worried about emptying his colostomy 9-10 times a day with watery stool. He reports when he drinks, the liquid empties out from the stoma. He reports urinating 3-4 times a day, but a small amount. He feels weak and fatigued. Denies nausea. He reports not using Lomotil at this time, but he is using Imodium. The patient has lost 3 lbs since 02/17/17 and 10 lbs since 01/27/17. He has nausea on the 3rd or 4th day.  MEDICAL HISTORY:  Past Medical History:  Diagnosis Date  . Cancer ([place]) 12/01/2016   colon   . Family history of colon cancer   . Poor dental hygiene        SURGICAL HISTORY: Past Surgical History:  Procedure Laterality Date  . EUS N/A 07/29/2016   Procedure: LOWER ENDOSCOPIC ULTRASOUND (EUS);  Surgeon: Milus Banister, MD;  Location: [facility];  Service: Endoscopy;  Laterality: N/A;  . PORTACATH PLACEMENT N/A 01/26/2017   Procedure: PLACEMENT OF PORT-A-CATH CENTRAL LINE WITH FLUOROSCOPY AND ULTRASOUND;  Surgeon: Michael Boston, MD;  Location: [facility];  Service: General;  Laterality: N/A;  . PROCTOSCOPY N/A 12/01/2016   Procedure: RIGID PROCTOSCOPY;  Surgeon: Michael Boston, MD;  Location: [facility];  Service: General;  Laterality: N/A;  . TOE AMPUTATION Left   . XI ROBOTIC ASSISTED LOWER ANTERIOR RESECTION N/A 12/01/2016   Procedure: XI ROBOTIC ASSISTED PROCTOCOLECTOMY WITH ILLEOPOUCH ANASTAMOSIS WITH DIVERTING ILLEOSTOMY;  Surgeon: Michael Boston, MD;  Location: [facility];  Service: General;  Laterality: N/A;    SOCIAL HISTORY: Social History   Social History  . Marital status: Married    Spouse name: N/A  . Number of children: 3  . Years of education: N/A   Occupational History  . Not on file.   Social History Main Topics  . Smoking  status: Former Smoker    Packs/day: 1.50    Years: 30.00    Types: Cigarettes    Quit date: 12/01/2016  . Smokeless tobacco: Never Used  . Alcohol use No  . Drug use: No     Comment: patient denies  . Sexual activity: Not on file   Other Topics Concern  . Not on file   Social History Narrative   Lives with significant other, [name]   Have a 10 year old son   Smoker      [place] HISTORY: Family History  Problem Relation Age of Onset  . Diabetes Mother   . COPD Father   . Colon cancer Maternal Aunt     dx in her 90s  . Diabetes Maternal Grandmother   . Brain cancer Maternal Grandfather   .  Lung cancer Paternal Grandfather   . Colon cancer Cousin     dx in her 69s  . Bone cancer Sister 8  . Pancreatic cancer Neg Hx   . Esophageal cancer Neg Hx   . Stomach cancer Neg Hx   . Liver disease Neg Hx    He lives with his girfriend and their 75 yo son, works for [employer]   ALLERGIES:  has No Known Allergies.  MEDICATIONS:  Current Outpatient Prescriptions  Medication Sig Dispense Refill  . ALPRAZolam (XANAX) 0.5 MG tablet Take 0.5 tablets (0.25 mg total) by mouth at bedtime as needed for anxiety. For anxiety 20 tablet 0  . naproxen sodium (ALEVE) 220 MG tablet Take 440 mg by mouth 2 (two) times daily as needed (for pain.).    Marland Kitchen ondansetron (ZOFRAN) 8 MG tablet Take 8 mg by mouth as needed.  1  . oxyCODONE (OXY IR/ROXICODONE) 5 MG immediate release tablet Take 1 tablet (5 mg total) by mouth every 6 (six) hours as needed for severe pain. 30 tablet 0  . prochlorperazine (COMPAZINE) 10 MG tablet Take 1 tablet (10 mg total) by mouth every 6 (six) hours as needed for nausea or vomiting. 30 tablet 2  . diphenoxylate-atropine (LOMOTIL) 2.5-0.025 MG tablet Take 1-2 tablets by mouth 4 (four) times daily as needed for diarrhea or loose stools. (Patient not taking: Reported on 02/17/2017) 60 tablet 1  . polyethylene glycol (MIRALAX / GLYCOLAX) packet Take  17 g by mouth as needed.     . traMADol (ULTRAM) 50 MG tablet Take 1-2 tablets (50-100 mg total) by mouth every 6 (six) hours as needed for moderate pain or severe pain (for pain.). (Patient not taking: Reported on 02/17/2017) 30 tablet 0   Current Facility-Administered Medications  Medication Dose Route Frequency Provider Last Rate Last Dose  . 0.9 %  sodium chloride infusion  500 mL Intravenous Continuous Charlie Pitter III, MD        REVIEW OF SYSTEMS: Constitutional: Denies fevers, chills or abnormal night sweats (+) sore at port site.  Eyes: Denies blurriness of vision, double vision or watery eyes Ears, nose, mouth, throat, and face: Denies mucositis or sore throat Respiratory: Denies cough, dyspnea or wheezes Cardiovascular: Denies palpitation, chest discomfort or lower extremity swelling Gastrointestinal:  Denies heartburn or change in bowel habits (+) loose stool Skin: Denies abnormal skin rashes Lymphatics: Denies new lymphadenopathy or easy bruising Neurological:Denies numbness, tingling or new weaknesses  Behavioral/Psych: Mood is stable, no new changes  All other systems were reviewed with the patient and are negative.  PHYSICAL EXAMINATION: ECOG PERFORMANCE STATUS: 1  Vitals:   02/17/17 1250  BP: 111/87  Pulse: (!) 110  Resp: 18  Temp: 97.8 F (36.6 C)   Filed Weights   02/17/17 1250  Weight: 134 lb 12.8 oz (61.1 kg)    GENERAL:alert, no distress and comfortable SKIN: skin color, texture, turgor are normal, no rashes or significant lesions EYES: normal, conjunctiva are pink and non-injected, sclera clear OROPHARYNX:no exudate, no erythema and lips, buccal mucosa, and tongue normal  NECK: supple, thyroid normal size, non-tender, without nodularity LYMPH:  no palpable lymphadenopathy in the cervical, axillary or inguinal LUNGS: clear to auscultation and percussion with normal breathing effort HEART: regular rate & rhythm and no murmurs and no lower extremity  edema ABDOMEN:abdomen soft, non-tender and normal bowel sounds. (+) Ileostomy bag with watery stool in the right abdomen, surgical incision sites has healed well. Musculoskeletal:no cyanosis of digits and no clubbing  PSYCH: alert & oriented x 3 with fluent speech NEURO: no focal motor/sensory deficits  LABORATORY DATA:  I have reviewed the data as listed CBC Latest Ref Rng & Units 02/17/2017 02/10/2017 01/27/2017  WBC 4.0 - 10.3 10e3/uL 3.4(L) 5.1 4.6  Hemoglobin 13.0 - 17.1 g/dL 15.2 13.4 13.1  Hematocrit 38.4 - 49.9 % 46.0 39.8 38.5  Platelets 140 - 400 10e3/uL 194 138(L) 182   CMP Latest Ref Rng & Units 02/17/2017 02/10/2017 01/27/2017  Glucose 70 - 140 mg/dl 121 137 116  BUN 7.0 - 26.0 mg/dL 11.8 8.4 6.2(L)  Creatinine 0.7 - 1.3 mg/dL 1.0 1.1 0.7  Sodium 136 - 145 mEq/L 134(L) 140 141  Potassium 3.5 - 5.1 mEq/L 4.6 3.6 3.7  Chloride 101 - 111 mmol/L - - -  CO2 22 - 29 mEq/L 20(L) 23 25  Calcium 8.4 - 10.4 mg/dL 8.5 8.4 8.3(L)  Total Protein 6.4 - 8.3 g/dL 5.7(L) 4.7(L) 4.7(L)  Total Bilirubin 0.20 - 1.20 mg/dL 0.31 0.41 <0.22  Alkaline Phos 40 - 150 U/L 64 52 60  AST 5 - 34 U/L _0 ALT 0 - 55 U/L _1 Pathology report  Diagnosis 07/28/2016 1. Surgical [P], descending, polyps(2) -TUBULAR ADENOMAS. -NO HIGH GRADE DYSPLASIA OR MALIGNANCY IDENTIFIED. 2. Surgical [P], rectal mass -INVASIVE ADENOCARCINOMA. -SEE COMMENT. 3. Surgical [P], rectum, polyps(2) -TUBULAR ADENOMAS. -NO HIGH GRADE DYSPLASIA OR MALIGNANCY IDENTIFIED. Microscopic Comment 2. Internal departmental review obtained (Dr. Saralyn Pilar) with agreement. Results are phoned to [name], nurse in the office of Dr. Loletha Carrow. ([facility] 08/03/16)    Diagnosis 12/01/2016 1. Colon, total resection (incl lymph nodes), proctocolectomy - SCATTERED SMALL RESIDUAL FOCI (EACH LESS THAN 0.3 CM) OF INVASIVE MODERATELY DIFFERENTIATED ADENOCARCINOMA EMBEDDED IN SUBMUCOSAL TISSUE WITH EXTENSIVE NEOADJUVANT EFFECT. - TUMOR IS PRESENT WITHIN  A REGION MEASURING APPROXIMATELY 2.5 CM IN GREATEST DIMENSION. - ADENOCARCINOMA INVADES THROUGH MUSCULARIS MUCOSA AND INVOLVES SUBSEROSAL SOFT TISSUE. - MARGINS ARE NEGATIVE. - TWO OF THIRTY-FIVE LYMPH NODES ARE POSITIVE FOR METASTATIC ADENOCARCINOMA (2/35). - LYMPH NODES ALSO DEMONSTRATE VARIABLE DEGREES OF NEOADJUVANT EFFECT. - TWO CALCIFIED AND NECROTIC SOFT TISSUE NODULES ARE ALSO PRESENT WITHOUT VIABLE TUMOR SEEN. - SEE ONCOLOGY TEMPLATE. 2. Colon, resection margin (donut), distal ring final distal margin - BENIGN ANAL MUCOSA. - NO DYSPLASIA OR TUMOR SEEN. Microscopic Comment 1. COLON AND RECTUM (INCLUDING TRANS-ANAL RESECTION): Specimen: Colon and rectum with attached appendix. Procedure: Robotic assisted proctocolectomy. Tumor site: Rectum. Specimen integrity: Intact. Macroscopic intactness of mesorectum: Near complete: The mesorectum is near complete with coning at the distal margin. Macroscopic tumor perforation: No. Invasive tumor: Maximum size: There is a 2.5 cm area of ulcerated colon with neoadjuvant changes and scattered small (less than 0.3 cm) foci of tumor. Histologic type(s): Invasive adenocarcinoma. Histologic grade and differentiation: G2: moderately differentiated/low grade. Type of polyp in which invasive carcinoma arose: Precursor polyp is not identified. Microscopic extension of invasive tumor: Tumor invades through muscularis propria to involve subserosal soft tissues. 1 of 3 FINAL for [name], [name] [phone]) Microscopic Comment(continued) Lymph-Vascular invasion: Lymph/vascular invasion is not identified; however, several lymph nodes are positive for metastatic tumor, see below. Peri-neural invasion: Not identified. Tumor deposit(s) (discontinuous extramural extension): No viable tumor deposits are identified. Resection margins (transanal resection margins): Proximal margin: Negative. Distal margin: Negative. Circumferential (radial) (posterior  ascending, posterior descending; lateral and posterior mid-rectum; and entire lower 1/3 rectum): Negative. Mucosal margin: Negative. Distance closest margin (if all above margins negative): 0.5 cm (radial / circumferential soft tissue  margin in distal rectum). Distance closest mucosal margin (if negative): Greater than 0.6 cm (based on amount of tissue within cassettes adjacent to distal stapled margin). Treatment effect (neo-adjuvant therapy): Prominent treatment effect is identified within the tumor and within the lymph nodes. Additional polyp(s): No additional polyps identified. Non-neoplastic findings: Diverticulosis. Lymph nodes: number examined 35; number positive: 2. Pathologic Staging: ypT3, ypN1b. Ancillary studies: As the patient is status post neoadjuvant therapy and there are only small residual foci of tumor, additional studies will not be performed unless otherwise requested.    RADIOGRAPHIC STUDIES: I have personally reviewed the radiological images as listed and agreed with the findings in the report.  EUS 07/29/2016 -Clearly malignant 6cm long, nearly circumferential mass in the distal rectum with distal edge located 1cm from the anal verge. If this is rectal adenocarcinoma it is uT3N2a, Stage IIIa.  COLONOSCOPY 07/28/2016 - Rectal mass. - Four 4 mm polyps in the rectum and in the descending colon, removed with a cold snare. Resected and retrieved. - Diverticulosis in the left colon. - Likely malignant tumor in the distal rectum. Biopsied. Tattooed.  CT of the chest/abd/pelvis with contrast 11/29/2016 IMPRESSION: 1. Interval treatment changes within the perirectal fat with otherwise stable rectosigmoid colon wall thickening. 2. No progressive adenopathy or distant metastases identified. 3. Stable bilateral pulmonary nodules. Short-term stability (for 4 months) is reassuring, although the nodules are too small to optimally characterize by previous PET-CT.  Continued CT follow-up recommended.   ASSESSMENT & PLAN: 46 y.o. male without significant past medical history presented with rectal pain and bleeding.  1. 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 He also has a slightly prominent periaortic lymph node, about 8 mm. -I previously reviewed his PET CT scan findings with patient and his girl firend, the small lung nodules and periaortic lymph node are not hypermetabolic, no definitive evidence of metastasis on the PET scan. -He completed neoadjuvant chemoradiation, tolerated moderate well overall.  -I previously reviewed his surgical pathology findings in great details. He had computed surgical resection, but unfortunately still has significant residual disease including 2 positive lymph nodes. -I recommended him to have adjuvant chemotherapy to reduce his risk of recurrence. Due to the positive lymph nodes, I recommend FOLFOX or CAPOX for 4.5 months  -The goal of therapy is curative. However his lung nodules are suspicious for metastatic disease, needs to be followed closely. If he does have lung metastasis, then his cancer is unlikely curable. -He did poorly with the first cycle CAPOX, chemo changed to FOLFOX from cycle 2 -He has high ileostomy output from his 3rd cycle of chemotherapy. We'll proceed to cycle 4 with FOLFOX on 02/24/17 as scheduled, will skip his 5-FU bolus  -Due to his high ileostomy output, I will continue supportive care with IVF and nutrition support. I'll arrange IV fluids 1 L normal saline 2-3 times week.  -lab were drawn in the infusion room, and results  reviewed with pt, Cr 1.0  2. Diarrhea  -He has watery stool after total colectomy, his ileostomy output has significant increases since he started chemotherapy -I encouraged him to continue using Imodium and Lomotil.  -I have set him up for  IV fluids 2-3 times a week.   3. 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 -He has undergone total colectomy and proctocolectomy, his risk of secondary colon cancer is minimal -We previously discussed other cancer risks from age syndrome, I recommend him to have EGD every 3-5 years, annual urinalysis for GU cancer screening.   4. Anxiety  -He feels nervous and anxious since his cancer diagnosis, much improved after starting treatment  -Continue Xanax as needed  5. Social issues -He is out of work, he wants to apply for Medicaid and disability  -He was seen by our Education officer, museum  6. Headache and pain at ileostomy bag -I encouraged him to use Tylenol for headaches.  -He states his pain is unbearable sometimes. He is taking oxycodone. 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  Plan -Labs today to check creatinine to see if he needs IV fluids tomorrow. -IVF this Saturday and next Monday. -I will see him back on 3/8/8 for labs, flush, IV fluids, follow up, and treatment cycle 3 FOLFOX, will cancel 5-fu bolus due to his severe diarrhea    All questions were answered. The patient knows to call the clinic with any problems, questions or concerns. I spent 15 minutes counseling the patient face to face. The total time spent in the appointment was 20 minutes and more than 50% was on counseling.   This document serves as a record of services personally performed by Truitt Merle, MD. It was created on her behalf by Martinique Casey, a trained medical  scribe. The creation of this record is based on the scribe's personal observations and the provider's statements to them. This document has been checked and approved by the attending provider.  I have reviewed the above documentation for accuracy and completeness, and I agree with the above information.       Truitt Merle, MD 02/17/2017   This document serves as a record of services personally performed by Truitt Merle, MD. It was created on her behalf by Darcus Austin, a trained medical scribe. The creation of this record is based on the scribe's personal observations and the provider's statements to them. This document has been checked and approved by the attending provider.

## 2017-02-17 NOTE — Patient Instructions (Signed)
Dehydration, Adult Dehydration is a condition in which there is not enough fluid or water in the body. This happens when you lose more fluids than you take in. Important organs, such as the kidneys, brain, and heart, cannot function without a proper amount of fluids. Any loss of fluids from the body can lead to dehydration. Dehydration can range from mild to severe. This condition should be treated right away to prevent it from becoming severe. What are the causes? This condition may be caused by:  Vomiting.  Diarrhea.  Excessive sweating, such as from heat exposure or exercise.  Not drinking enough fluid, especially:  When ill.  While doing activity that requires a lot of energy.  Excessive urination.  Fever.  Infection.  Certain medicines, such as medicines that cause the body to lose excess fluid (diuretics).  Inability to access safe drinking water.  Reduced physical ability to get adequate water and food. What increases the risk? This condition is more likely to develop in people:  Who have a poorly controlled long-term (chronic) illness, such as diabetes, heart disease, or kidney disease.  Who are age 65 or older.  Who are disabled.  Who live in a place with high altitude.  Who play endurance sports. What are the signs or symptoms? Symptoms of mild dehydration may include:   Thirst.  Dry lips.  Slightly dry mouth.  Dry, warm skin.  Dizziness. Symptoms of moderate dehydration may include:   Very dry mouth.  Muscle cramps.  Dark urine. Urine may be the color of tea.  Decreased urine production.  Decreased tear production.  Heartbeat that is irregular or faster than normal (palpitations).  Headache.  Light-headedness, especially when you stand up from a sitting position.  Fainting (syncope). Symptoms of severe dehydration may include:   Changes in skin, such as:  Cold and clammy skin.  Blotchy (mottled) or pale skin.  Skin that does  not quickly return to normal after being lightly pinched and released (poor skin turgor).  Changes in body fluids, such as:  Extreme thirst.  No tear production.  Inability to sweat when body temperature is high, such as in hot weather.  Very little urine production.  Changes in vital signs, such as:  Weak pulse.  Pulse that is more than 100 beats a minute when sitting still.  Rapid breathing.  Low blood pressure.  Other changes, such as:  Sunken eyes.  Cold hands and feet.  Confusion.  Lack of energy (lethargy).  Difficulty waking up from sleep.  Short-term weight loss.  Unconsciousness. How is this diagnosed? This condition is diagnosed based on your symptoms and a physical exam. Blood and urine tests may be done to help confirm the diagnosis. How is this treated? Treatment for this condition depends on the severity. Mild or moderate dehydration can often be treated at home. Treatment should be started right away. Do not wait until dehydration becomes severe. Severe dehydration is an emergency and it needs to be treated in a hospital. Treatment for mild dehydration may include:   Drinking more fluids.  Replacing salts and minerals in your blood (electrolytes) that you may have lost. Treatment for moderate dehydration may include:   Drinking an oral rehydration solution (ORS). This is a drink that helps you replace fluids and electrolytes (rehydrate). It can be found at pharmacies and retail stores. Treatment for severe dehydration may include:   Receiving fluids through an IV tube.  Receiving an electrolyte solution through a feeding tube that is   passed through your nose and into your stomach (nasogastric tube, or NG tube).  Correcting any abnormalities in electrolytes.  Treating the underlying cause of dehydration. Follow these instructions at home:  If directed by your health care provider, drink an ORS:  Make an ORS by following instructions on the  package.  Start by drinking small amounts, about  cup (120 mL) every 5-10 minutes.  Slowly increase how much you drink until you have taken the amount recommended by your health care provider.  Drink enough clear fluid to keep your urine clear or pale yellow. If you were told to drink an ORS, finish the ORS first, then start slowly drinking other clear fluids. Drink fluids such as:  Water. Do not drink only water. Doing that can lead to having too little salt (sodium) in the body (hyponatremia).  Ice chips.  Fruit juice that you have added water to (diluted fruit juice).  Low-calorie sports drinks.  Avoid:  Alcohol.  Drinks that contain a lot of sugar. These include high-calorie sports drinks, fruit juice that is not diluted, and soda.  Caffeine.  Foods that are greasy or contain a lot of fat or sugar.  Take over-the-counter and prescription medicines only as told by your health care provider.  Do not take sodium tablets. This can lead to having too much sodium in the body (hypernatremia).  Eat foods that contain a healthy balance of electrolytes, such as bananas, oranges, potatoes, tomatoes, and spinach.  Keep all follow-up visits as told by your health care provider. This is important. Contact a health care provider if:  You have abdominal pain that:  Gets worse.  Stays in one area (localizes).  You have a rash.  You have a stiff neck.  You are more irritable than usual.  You are sleepier or more difficult to wake up than usual.  You feel weak or dizzy.  You feel very thirsty.  You have urinated only a small amount of very dark urine over 6-8 hours. Get help right away if:  You have symptoms of severe dehydration.  You cannot drink fluids without vomiting.  Your symptoms get worse with treatment.  You have a fever.  You have a severe headache.  You have vomiting or diarrhea that:  Gets worse.  Does not go away.  You have blood or green matter  (bile) in your vomit.  You have blood in your stool. This may cause stool to look black and tarry.  You have not urinated in 6-8 hours.  You faint.  Your heart rate while sitting still is over 100 beats a minute.  You have trouble breathing. This information is not intended to replace advice given to you by your health care provider. Make sure you discuss any questions you have with your health care provider. Document Released: 12/06/2005 Document Revised: 07/02/2016 Document Reviewed: 01/30/2016 Elsevier Interactive Patient Education  2017 [employer]  

## 2017-02-19 ENCOUNTER — Ambulatory Visit: Payer: Medicaid Other

## 2017-02-21 ENCOUNTER — Ambulatory Visit (HOSPITAL_BASED_OUTPATIENT_CLINIC_OR_DEPARTMENT_OTHER): Payer: Medicaid Other

## 2017-02-21 VITALS — BP 117/79 | HR 48 | Temp 97.7°F | Resp 16

## 2017-02-21 DIAGNOSIS — C20 Malignant neoplasm of rectum: Secondary | ICD-10-CM | POA: Diagnosis not present

## 2017-02-21 DIAGNOSIS — R197 Diarrhea, unspecified: Secondary | ICD-10-CM

## 2017-02-21 MED ORDER — HEPARIN SOD (PORK) LOCK FLUSH 100 UNIT/ML IV SOLN
500.0000 [IU] | Freq: Once | INTRAVENOUS | Status: AC | PRN
  Administered 2017-02-21: 500 [IU]
  Filled 2017-02-21: qty 5

## 2017-02-21 MED ORDER — SODIUM CHLORIDE 0.9% FLUSH
10.0000 mL | INTRAVENOUS | Status: DC | PRN
  Administered 2017-02-21: 10 mL
  Filled 2017-02-21: qty 10

## 2017-02-21 MED ORDER — SODIUM CHLORIDE 0.9 % IV SOLN
Freq: Once | INTRAVENOUS | Status: AC
  Administered 2017-02-21: 10:00:00 via INTRAVENOUS

## 2017-02-21 NOTE — Patient Instructions (Signed)
Dehydration, Adult Dehydration is a condition in which there is not enough fluid or water in the body. This happens when you lose more fluids than you take in. Important organs, such as the kidneys, brain, and heart, cannot function without a proper amount of fluids. Any loss of fluids from the body can lead to dehydration. Dehydration can range from mild to severe. This condition should be treated right away to prevent it from becoming severe. What are the causes? This condition may be caused by:  Vomiting.  Diarrhea.  Excessive sweating, such as from heat exposure or exercise.  Not drinking enough fluid, especially:  When ill.  While doing activity that requires a lot of energy.  Excessive urination.  Fever.  Infection.  Certain medicines, such as medicines that cause the body to lose excess fluid (diuretics).  Inability to access safe drinking water.  Reduced physical ability to get adequate water and food. What increases the risk? This condition is more likely to develop in people:  Who have a poorly controlled long-term (chronic) illness, such as diabetes, heart disease, or kidney disease.  Who are age 65 or older.  Who are disabled.  Who live in a place with high altitude.  Who play endurance sports. What are the signs or symptoms? Symptoms of mild dehydration may include:   Thirst.  Dry lips.  Slightly dry mouth.  Dry, warm skin.  Dizziness. Symptoms of moderate dehydration may include:   Very dry mouth.  Muscle cramps.  Dark urine. Urine may be the color of tea.  Decreased urine production.  Decreased tear production.  Heartbeat that is irregular or faster than normal (palpitations).  Headache.  Light-headedness, especially when you stand up from a sitting position.  Fainting (syncope). Symptoms of severe dehydration may include:   Changes in skin, such as:  Cold and clammy skin.  Blotchy (mottled) or pale skin.  Skin that does  not quickly return to normal after being lightly pinched and released (poor skin turgor).  Changes in body fluids, such as:  Extreme thirst.  No tear production.  Inability to sweat when body temperature is high, such as in hot weather.  Very little urine production.  Changes in vital signs, such as:  Weak pulse.  Pulse that is more than 100 beats a minute when sitting still.  Rapid breathing.  Low blood pressure.  Other changes, such as:  Sunken eyes.  Cold hands and feet.  Confusion.  Lack of energy (lethargy).  Difficulty waking up from sleep.  Short-term weight loss.  Unconsciousness. How is this diagnosed? This condition is diagnosed based on your symptoms and a physical exam. Blood and urine tests may be done to help confirm the diagnosis. How is this treated? Treatment for this condition depends on the severity. Mild or moderate dehydration can often be treated at home. Treatment should be started right away. Do not wait until dehydration becomes severe. Severe dehydration is an emergency and it needs to be treated in a hospital. Treatment for mild dehydration may include:   Drinking more fluids.  Replacing salts and minerals in your blood (electrolytes) that you may have lost. Treatment for moderate dehydration may include:   Drinking an oral rehydration solution (ORS). This is a drink that helps you replace fluids and electrolytes (rehydrate). It can be found at pharmacies and retail stores. Treatment for severe dehydration may include:   Receiving fluids through an IV tube.  Receiving an electrolyte solution through a feeding tube that is   passed through your nose and into your stomach (nasogastric tube, or NG tube).  Correcting any abnormalities in electrolytes.  Treating the underlying cause of dehydration. Follow these instructions at home:  If directed by your health care provider, drink an ORS:  Make an ORS by following instructions on the  package.  Start by drinking small amounts, about  cup (120 mL) every 5-10 minutes.  Slowly increase how much you drink until you have taken the amount recommended by your health care provider.  Drink enough clear fluid to keep your urine clear or pale yellow. If you were told to drink an ORS, finish the ORS first, then start slowly drinking other clear fluids. Drink fluids such as:  Water. Do not drink only water. Doing that can lead to having too little salt (sodium) in the body (hyponatremia).  Ice chips.  Fruit juice that you have added water to (diluted fruit juice).  Low-calorie sports drinks.  Avoid:  Alcohol.  Drinks that contain a lot of sugar. These include high-calorie sports drinks, fruit juice that is not diluted, and soda.  Caffeine.  Foods that are greasy or contain a lot of fat or sugar.  Take over-the-counter and prescription medicines only as told by your health care provider.  Do not take sodium tablets. This can lead to having too much sodium in the body (hypernatremia).  Eat foods that contain a healthy balance of electrolytes, such as bananas, oranges, potatoes, tomatoes, and spinach.  Keep all follow-up visits as told by your health care provider. This is important. Contact a health care provider if:  You have abdominal pain that:  Gets worse.  Stays in one area (localizes).  You have a rash.  You have a stiff neck.  You are more irritable than usual.  You are sleepier or more difficult to wake up than usual.  You feel weak or dizzy.  You feel very thirsty.  You have urinated only a small amount of very dark urine over 6-8 hours. Get help right away if:  You have symptoms of severe dehydration.  You cannot drink fluids without vomiting.  Your symptoms get worse with treatment.  You have a fever.  You have a severe headache.  You have vomiting or diarrhea that:  Gets worse.  Does not go away.  You have blood or green matter  (bile) in your vomit.  You have blood in your stool. This may cause stool to look black and tarry.  You have not urinated in 6-8 hours.  You faint.  Your heart rate while sitting still is over 100 beats a minute.  You have trouble breathing. This information is not intended to replace advice given to you by your health care provider. Make sure you discuss any questions you have with your health care provider. Document Released: 12/06/2005 Document Revised: 07/02/2016 Document Reviewed: 01/30/2016 Elsevier Interactive Patient Education  2017 [employer]  

## 2017-02-23 NOTE — Progress Notes (Signed)
[name]  [phone] [phone]  Clinic follow Up Note   Patient Care Team: [name] as PCP - General (General Practice) Tania Ade, RN as Registered Nurse Michael Boston, MD as Consulting Physician (General Surgery) Doran Stabler, MD as Consulting Physician (Gastroenterology) Kyung Rudd, MD as Consulting Physician (Radiation Oncology) Truitt Merle, MD as Consulting Physician (Oncology) 02/24/2017  CHIEF COMPLAINTS:  Follow up rectal cancer  Oncology History   Rectal adenocarcinoma [facility])   Staging form: Colon and Rectum, AJCC 7th Edition   - Clinical stage from 07/28/2016: Stage IIIB (T3, N2a, M0) - Signed by Truitt Merle, MD on 08/06/2016      Rectal adenocarcinoma s/p protctocolectomy, IPAA "J" pouch 12/01/2016   07/26/2016 Imaging    CT chest, abdomen and pelvis with contrast showed nodular thickening of the rectal wall, enlarged perirectal lymph nodes. Diverticulosis. Nonspecific low-level lymphadenopathy. Lower lobe emphysema, small 3-4 mm left lower lobe lung nodules.      07/28/2016 Initial Diagnosis    Rectal adenocarcinoma ([place])      07/28/2016 Initial Biopsy    Rectal mass biopsy showed invasive adenocarcinoma, polyps in the ascending colon and rectum showed tubular adenoma       07/28/2016 Procedure    Colonoscopy showed a nearly circumferential mass in the distal rectum, 4 polyps in the descending colon, diverticulosis in the left colon.       07/29/2016 Procedure    Low EUS showed clearly malignant 6 cm long, nearly circumferential mass in the distal rectum, with distal edge located to 1 cm from the annual approach, multiple prior rectal lymph nodes (6) are suspicious ,uT3 N2       08/17/2016 - 09/24/2016 Radiation Therapy    Neoadjuvant radiation to his rectal cancer 1) Rectum/ 45 Gy in 25 fx                      2) Boost/ 5.4 Gy in 3 fx       08/17/2016 - 09/24/2016 Chemotherapy    Xeloda 1500 mg twice daily with  concurrent irradiation      08/18/2016 Imaging    PET scan showed hypermetabolic rectal mass, perirectal node 45m with mild increased uptake, no hypermetabolic distant metastasis, small pulmonary nodules are too small to characterize by PET.       11/29/2016 Imaging    CT of the C/A/P W Contrast IMPRESSION: 1. Interval treatment changes within the perirectal fat with otherwise stable rectosigmoid colon wall thickening. 2. No progressive adenopathy or distant metastases identified. 3. Stable bilateral pulmonary nodules. Short-term stability (for 4 months) is reassuring, although the nodules are too small to optimally characterize by previous PET-CT. Continued CT follow-up recommended.      12/01/2016 Surgery    Colon total resection and proctocolectomy for rectal cancer and Lynch syndrome       12/01/2016 Pathology Results    Proctocolectomy showed scattered small residual foci of invasive moderately differentiated adenocarcinoma embedded in submucosa tissue with extensive neoadjuvant effect, 2.5 cm in greatest time mention, adenocarcinoma invades through muscularis mucosa and involves subserosal soft tissue, margins are negative, 2 of 35 lymph nodes are positive for metastatic adenocarcinoma, 2 calcified and necrotic soft tissue nodules are also present without visible tumor seen.      01/07/2017 -  Chemotherapy    Adjuvant chemo CAPOX, changed to FOLFOX from cycle 2 due to poor tolerance         HISTORY  OF PRESENTING ILLNESS:  [name] 46 y.o. male is here because of His newly diagnosed rectal cancer. He is accompanied by his girlfriend to our multidisciplinary GI clinic today.  He has had rectal pain for 6 weeks, and mild rectal bleeding with BM, he goes 4 times a day, very little each time, and pencil shaped stool, he has lost 30 lbs so far. His appettie remains to be good, he eats well, no cough, nausea, no abd pain. He has a moderate fatigue, he will 2 tolerate his routine  activities including his job without much difficulty. He does not have a primary care physician, and has not seen doctors for many years.  He presents to emergency room on 07/26/2016 for the rectal pain and bleeding, and was referred to gastroenterologist Dr. Loletha Carrow. He underwent colonoscopy on 07/28/2016, which showed 4 polyps in the ascending colon, and a circumferential distal rectal mass. Biopsy of the rectal mass showed adenocarcinoma. He underwent EUS by Dr. Ardis Hughs on 07/29/2016 which showed a T3 N2 disease.   CURRENT THERAPY: adjuvant chemo CAPOX, changed to FOLFOX from cycle 2   INTERIM HISTORY  [name] returns for follow-up and cycle 3 chemo. He is accompanied by his girlfriend today. He reports he is doing well this week. The patient received IV fluids twice this week. He missed IV fluids this past weekend because of a headache. The patient reports 4-5 bowel movements a day, reduced from 10 a day previously. He is using 6 Lomotil daily. He reports drinking "quite a bit" of water a day. He denies dizziness or headaches.   MEDICAL HISTORY:  Past Medical History:  Diagnosis Date  . Cancer ([place]) 12/01/2016   colon   . Family history of colon cancer   . Poor dental hygiene        SURGICAL HISTORY: Past Surgical History:  Procedure Laterality Date  . EUS N/A 07/29/2016   Procedure: LOWER ENDOSCOPIC ULTRASOUND (EUS);  Surgeon: Milus Banister, MD;  Location: [facility];  Service: Endoscopy;  Laterality: N/A;  . PORTACATH PLACEMENT N/A 01/26/2017   Procedure: PLACEMENT OF PORT-A-CATH CENTRAL LINE WITH FLUOROSCOPY AND ULTRASOUND;  Surgeon: Michael Boston, MD;  Location: [facility];  Service: General;  Laterality: N/A;  . PROCTOSCOPY N/A 12/01/2016   Procedure: RIGID PROCTOSCOPY;  Surgeon: Michael Boston, MD;  Location: [facility];  Service: General;  Laterality: N/A;  . TOE AMPUTATION Left   . XI ROBOTIC ASSISTED LOWER ANTERIOR RESECTION N/A 12/01/2016   Procedure: XI ROBOTIC  ASSISTED PROCTOCOLECTOMY WITH ILLEOPOUCH ANASTAMOSIS WITH DIVERTING ILLEOSTOMY;  Surgeon: Michael Boston, MD;  Location: [facility];  Service: General;  Laterality: N/A;    SOCIAL HISTORY: Social History   Social History  . Marital status: Married    Spouse name: N/A  . Number of children: 3  . Years of education: N/A   Occupational History  . Not on file.   Social History Main Topics  . Smoking status: Former Smoker    Packs/day: 1.50    Years: 30.00    Types: Cigarettes    Quit date: 12/01/2016  . Smokeless tobacco: Never Used  . Alcohol use No  . Drug use: No     Comment: patient denies  . Sexual activity: Not on file   Other Topics Concern  . Not on file   Social History Narrative   Lives with significant other, [name]   Have a 93 year old son   Smoker  [place]    FAMILY HISTORY: Family History  Problem Relation Age of Onset  . Diabetes Mother   . COPD Father   . Colon cancer Maternal Aunt     dx in her 50s  . Diabetes Maternal Grandmother   . Brain cancer Maternal Grandfather   . Lung cancer Paternal Grandfather   . Colon cancer Cousin     dx in her 65s  . Bone cancer Sister 8  . Pancreatic cancer Neg Hx   . Esophageal cancer Neg Hx   . Stomach cancer Neg Hx   . Liver disease Neg Hx    He lives with his girfriend and their 97 yo son, works for [employer]   ALLERGIES:  has No Known Allergies.  MEDICATIONS:  Current Outpatient Prescriptions  Medication Sig Dispense Refill  . ALPRAZolam (XANAX) 0.5 MG tablet Take 0.5 tablets (0.25 mg total) by mouth at bedtime as needed for anxiety. For anxiety 20 tablet 0  . diphenoxylate-atropine (LOMOTIL) 2.5-0.025 MG tablet Take 1-2 tablets by mouth 4 (four) times daily as needed for diarrhea or loose stools. 60 tablet 1  . naproxen sodium (ALEVE) 220 MG tablet Take 440 mg by mouth 2 (two) times daily as needed (for pain.).    Marland Kitchen ondansetron (ZOFRAN) 8 MG tablet Take 8 mg by mouth as needed.   1  . oxyCODONE (OXY IR/ROXICODONE) 5 MG immediate release tablet Take 1 tablet (5 mg total) by mouth every 6 (six) hours as needed for severe pain. 30 tablet 0  . polyethylene glycol (MIRALAX / GLYCOLAX) packet Take 17 g by mouth as needed.     . prochlorperazine (COMPAZINE) 10 MG tablet Take 1 tablet (10 mg total) by mouth every 6 (six) hours as needed for nausea or vomiting. 30 tablet 2  . traMADol (ULTRAM) 50 MG tablet Take 1-2 tablets (50-100 mg total) by mouth every 6 (six) hours as needed for moderate pain or severe pain (for pain.). (Patient not taking: Reported on 02/17/2017) 30 tablet 0   Current Facility-Administered Medications  Medication Dose Route Frequency Provider Last Rate Last Dose  . 0.9 %  sodium chloride infusion  500 mL Intravenous Continuous Nelida Meuse III, MD       Facility-Administered Medications Ordered in Other Visits  Medication Dose Route Frequency Provider Last Rate Last Dose  . 0.9 %  sodium chloride infusion   Intravenous Once Truitt Merle, MD        REVIEW OF SYSTEMS:  Constitutional: Denies fevers, chills or abnormal night sweats Eyes: Denies blurriness of vision, double vision or watery eyes Ears, nose, mouth, throat, and face: Denies mucositis or sore throat Respiratory: Denies cough, dyspnea or wheezes Cardiovascular: Denies palpitation, chest discomfort or lower extremity swelling Gastrointestinal:  Denies heartburn (+) loose stool, improved lately  Skin: Denies abnormal skin rashes Lymphatics: Denies new lymphadenopathy or easy bruising Neurological:Denies numbness, tingling or new weaknesses  Behavioral/Psych: Mood is stable, no new changes  All other systems were reviewed with the patient and are negative.  PHYSICAL EXAMINATION:  ECOG PERFORMANCE STATUS: 1  Vitals:   02/24/17 0902  BP: 107/75  Pulse: 81  Resp: 18  Temp: 97.8 F (36.6 C)   Filed Weights   02/24/17 0902  Weight: 144 lb 6.4 oz (65.5 kg)    GENERAL:alert, no distress and  comfortable SKIN: skin color, texture, turgor are normal, no rashes or significant lesions EYES: normal, conjunctiva are pink and non-injected, sclera clear OROPHARYNX:no exudate, no erythema  and lips, buccal mucosa, and tongue normal  NECK: supple, thyroid normal size, non-tender, without nodularity LYMPH:  no palpable lymphadenopathy in the cervical, axillary or inguinal LUNGS: clear to auscultation and percussion with normal breathing effort HEART: regular rate & rhythm and no murmurs and no lower extremity edema ABDOMEN:abdomen soft, non-tender and normal bowel sounds. (+) Ileostomy bag with watery stool in the right abdomen, surgical incision sites has healed well. Musculoskeletal:no cyanosis of digits and no clubbing  PSYCH: alert & oriented x 3 with fluent speech NEURO: no focal motor/sensory deficits  LABORATORY DATA:  I have reviewed the data as listed CBC Latest Ref Rng & Units 02/24/2017 02/17/2017 02/10/2017  WBC 4.0 - 10.3 10e3/uL 5.3 3.4(L) 5.1  Hemoglobin 13.0 - 17.1 g/dL 14.5 15.2 13.4  Hematocrit 38.4 - 49.9 % 43.7 46.0 39.8  Platelets 140 - 400 10e3/uL 124(L) 194 138(L)   CMP Latest Ref Rng & Units 02/24/2017 02/17/2017 02/10/2017  Glucose 70 - 140 mg/dl 124 121 137  BUN 7.0 - 26.0 mg/dL 8.4 11.8 8.4  Creatinine 0.7 - 1.3 mg/dL 0.9 1.0 1.1  Sodium 136 - 145 mEq/L 139 134(L) 140  Potassium 3.5 - 5.1 mEq/L 3.9 4.6 3.6  Chloride 101 - 111 mmol/L - - -  CO2 22 - 29 mEq/L 23 20(L) 23  Calcium 8.4 - 10.4 mg/dL 8.6 8.5 8.4  Total Protein 6.4 - 8.3 g/dL 5.0(L) 5.7(L) 4.7(L)  Total Bilirubin 0.20 - 1.20 mg/dL 0.22 0.31 0.41  Alkaline Phos 40 - 150 U/L 55 64 52  AST 5 - 34 U/L _0 ALT 0 - 55 U/L _1 Pathology report  Diagnosis 07/28/2016 1. Surgical [P], descending, polyps(2) -TUBULAR ADENOMAS. -NO HIGH GRADE DYSPLASIA OR MALIGNANCY IDENTIFIED. 2. Surgical [P], rectal mass -INVASIVE ADENOCARCINOMA. -SEE COMMENT. 3. Surgical [P], rectum, polyps(2) -TUBULAR  ADENOMAS. -NO HIGH GRADE DYSPLASIA OR MALIGNANCY IDENTIFIED.    Diagnosis 12/01/2016 1. Colon, total resection (incl lymph nodes), proctocolectomy - SCATTERED SMALL RESIDUAL FOCI (EACH LESS THAN 0.3 CM) OF INVASIVE MODERATELY DIFFERENTIATED ADENOCARCINOMA EMBEDDED IN SUBMUCOSAL TISSUE WITH EXTENSIVE NEOADJUVANT EFFECT. - TUMOR IS PRESENT WITHIN A REGION MEASURING APPROXIMATELY 2.5 CM IN GREATEST DIMENSION. - ADENOCARCINOMA INVADES THROUGH MUSCULARIS MUCOSA AND INVOLVES SUBSEROSAL SOFT TISSUE. - MARGINS ARE NEGATIVE. - TWO OF THIRTY-FIVE LYMPH NODES ARE POSITIVE FOR METASTATIC ADENOCARCINOMA (2/35). - LYMPH NODES ALSO DEMONSTRATE VARIABLE DEGREES OF NEOADJUVANT EFFECT. - TWO CALCIFIED AND NECROTIC SOFT TISSUE NODULES ARE ALSO PRESENT WITHOUT VIABLE TUMOR SEEN. - SEE ONCOLOGY TEMPLATE. 2. Colon, resection margin (donut), distal ring final distal margin - BENIGN ANAL MUCOSA. - NO DYSPLASIA OR TUMOR SEEN.  RADIOGRAPHIC STUDIES: I have personally reviewed the radiological images as listed and agreed with the findings in the report.  EUS 07/29/2016 -Clearly malignant 6cm long, nearly circumferential mass in the distal rectum with distal edge located 1cm from the anal verge. If this is rectal adenocarcinoma it is uT3N2a, Stage IIIa.  COLONOSCOPY 07/28/2016 - Rectal mass. - Four 4 mm polyps in the rectum and in the descending colon, removed with a cold snare. Resected and retrieved. - Diverticulosis in the left colon. - Likely malignant tumor in the distal rectum. Biopsied. Tattooed.  CT of the chest/abd/pelvis with contrast 11/29/2016 IMPRESSION: 1. Interval treatment changes within the perirectal fat with otherwise stable rectosigmoid colon wall thickening. 2. No progressive adenopathy or distant metastases identified. 3. Stable bilateral pulmonary nodules. Short-term stability (for 4 months) is reassuring, although the nodules are too small to  optimally characterize by previous  PET-CT. Continued CT follow-up recommended.   ASSESSMENT & PLAN: 46 y.o. male without significant past medical history presented with rectal pain and bleeding.  1. 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 He also has a slightly prominent periaortic lymph node, about 8 mm. -I previously reviewed his PET CT scan findings with patient and his girl firend, the small lung nodules and periaortic lymph node are not hypermetabolic, no definitive evidence of metastasis on the PET scan. -He previously completed neoadjuvant chemoradiation, tolerated moderate well overall.  -I previously reviewed his surgical pathology findings in great details. He had computed surgical resection, but unfortunately still has significant residual disease including 2 positive lymph nodes. -I previously recommended him to have adjuvant chemotherapy to reduce his risk of recurrence. Due to the positive lymph nodes, I recommend FOLFOX or CAPOX for 4.5 months  -The goal of therapy is curative. However his lung nodules are suspicious for metastatic disease, needs to be followed closely. If he does have lung metastasis, then his cancer is unlikely curable. -He did poorly with the first cycle CAPOX, chemo changed to FOLFOX from cycle 2 -He has high ileostomy output from his 3rd cycle of chemotherapy. We'll proceed to cycle 4 with FOLFOX on 02/24/17 as scheduled, will skip his 5-FU bolus  -His diarrhea has improved, I will continue supportive care with IVF and nutrition support. I'll arrange IV fluids 1 L normal saline 1-2 times a week  -lab were drawn today, CMP  pending. -We reviewed that the first 3 months of chemo are very important, and the patient should let me know if he experiences any negative side effects such as neuropathy   2. Diarrhea  -He has watery stool after total colectomy, his ileostomy output has significant increases since he started chemotherapy -I encouraged him to continue using Imodium and Lomotil.  -Hysteria has much improved with Lomotil, he will continue -I have previously set him up for IV fluids 2-3 times a week, will cut down to 1-2 times a week   3. 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 -He has undergone total colectomy and proctocolectomy, his risk of secondary colon cancer is minimal -We previously discussed other cancer risks from age syndrome, I recommend him to have EGD every 3-5 years, annual urinalysis for GU cancer screening.   4. Anxiety  -He feels nervous and anxious since his cancer diagnosis, much improved after starting treatment  -Continue Xanax as needed  5. Social issues -He is out of work, he wants to apply for Medicaid and disability  -He was seen by our Education officer, museum  6. Headache and pain at ileostomy bag -I previously encouraged him to use Tylenol for headaches.  -He has stated his pain is unbearable sometimes. He is taking oxycodone. 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  Plan -Labs reviewed. The patient will have cycle 3 FOLFOX today, he will received IVF 1L today. -The patient will return for pump disconnection on  03/07/17 with IV fluids. -Patient will receive IV fluids once next week. -Follow up in 2 weeks with lab, flush, and chemo.    All questions were answered. The patient knows to call the clinic with any problems, questions or concerns. I spent 20  minutes counseling the patient face to face. The total time spent in the appointment was 25 minutes and more than 50% was on counseling.  I have reviewed the above documentation for accuracy and completeness, and I agree with the above information.   This document serves as a record of services personally performed by Truitt Merle, MD. It was created on her behalf by Maryla Morrow, a trained medical scribe. The creation of this record is based on the scribe's personal observations and the provider's statements to them. This document has been checked and approved by the attending provider.       Truitt Merle, MD 02/24/2017

## 2017-02-24 ENCOUNTER — Encounter: Payer: Self-pay | Admitting: Hematology

## 2017-02-24 ENCOUNTER — Ambulatory Visit (HOSPITAL_BASED_OUTPATIENT_CLINIC_OR_DEPARTMENT_OTHER): Payer: Medicaid Other

## 2017-02-24 ENCOUNTER — Telehealth: Payer: Self-pay | Admitting: Hematology

## 2017-02-24 ENCOUNTER — Ambulatory Visit: Payer: Medicaid Other

## 2017-02-24 ENCOUNTER — Other Ambulatory Visit (HOSPITAL_BASED_OUTPATIENT_CLINIC_OR_DEPARTMENT_OTHER): Payer: Medicaid Other

## 2017-02-24 ENCOUNTER — Ambulatory Visit (HOSPITAL_BASED_OUTPATIENT_CLINIC_OR_DEPARTMENT_OTHER): Payer: Medicaid Other | Admitting: Hematology

## 2017-02-24 VITALS — BP 107/75 | HR 81 | Temp 97.8°F | Resp 18 | Ht 71.0 in | Wt 144.4 lb

## 2017-02-24 DIAGNOSIS — R197 Diarrhea, unspecified: Secondary | ICD-10-CM | POA: Diagnosis not present

## 2017-02-24 DIAGNOSIS — C20 Malignant neoplasm of rectum: Secondary | ICD-10-CM

## 2017-02-24 DIAGNOSIS — Z1509 Genetic susceptibility to other malignant neoplasm: Secondary | ICD-10-CM

## 2017-02-24 DIAGNOSIS — R51 Headache: Secondary | ICD-10-CM

## 2017-02-24 DIAGNOSIS — F418 Other specified anxiety disorders: Secondary | ICD-10-CM

## 2017-02-24 DIAGNOSIS — Z5111 Encounter for antineoplastic chemotherapy: Secondary | ICD-10-CM

## 2017-02-24 DIAGNOSIS — R918 Other nonspecific abnormal finding of lung field: Secondary | ICD-10-CM | POA: Diagnosis not present

## 2017-02-24 DIAGNOSIS — F419 Anxiety disorder, unspecified: Secondary | ICD-10-CM

## 2017-02-24 LAB — COMPREHENSIVE METABOLIC PANEL
ALBUMIN: 2.5 g/dL — AB (ref 3.5–5.0)
ALT: 13 U/L (ref 0–55)
ANION GAP: 5 meq/L (ref 3–11)
AST: 26 U/L (ref 5–34)
Alkaline Phosphatase: 55 U/L (ref 40–150)
BILIRUBIN TOTAL: 0.22 mg/dL (ref 0.20–1.20)
BUN: 8.4 mg/dL (ref 7.0–26.0)
CALCIUM: 8.6 mg/dL (ref 8.4–10.4)
CO2: 23 mEq/L (ref 22–29)
CREATININE: 0.9 mg/dL (ref 0.7–1.3)
Chloride: 112 mEq/L — ABNORMAL HIGH (ref 98–109)
EGFR: >90 mL/min/{1.73_m2} (ref >90)
Glucose: 124 mg/dl (ref 70–140)
Potassium: 3.9 mEq/L (ref 3.5–5.1)
Sodium: 139 mEq/L (ref 136–145)
TOTAL PROTEIN: 5 g/dL — AB (ref 6.4–8.3)

## 2017-02-24 LAB — CBC WITH DIFFERENTIAL/PLATELET
BASO%: 0.8 % (ref 0.0–2.0)
Basophils Absolute: 0 10*3/uL (ref 0.0–0.1)
EOS%: 1.3 % (ref 0.0–7.0)
Eosinophils Absolute: 0.1 10*3/uL (ref 0.0–0.5)
HEMATOCRIT: 43.7 % (ref 38.4–49.9)
HEMOGLOBIN: 14.5 g/dL (ref 13.0–17.1)
LYMPH#: 0.6 10*3/uL — AB (ref 0.9–3.3)
LYMPH%: 10.5 % — ABNORMAL LOW (ref 14.0–49.0)
MCH: 27.7 pg (ref 27.2–33.4)
MCHC: 33.2 g/dL (ref 32.0–36.0)
MCV: 83.6 fL (ref 79.3–98.0)
MONO#: 0.6 10*3/uL (ref 0.1–0.9)
MONO%: 11.7 % (ref 0.0–14.0)
NEUT%: 75.7 % — AB (ref 39.0–75.0)
NEUTROS ABS: 4 10*3/uL (ref 1.5–6.5)
PLATELETS: 124 10*3/uL — AB (ref 140–400)
RBC: 5.23 10*6/uL (ref 4.20–5.82)
RDW: 16.6 % — AB (ref 11.0–14.6)
WBC: 5.3 10*3/uL (ref 4.0–10.3)

## 2017-02-24 MED ORDER — DEXTROSE 5 % IV SOLN
400.0000 mg/m2 | Freq: Once | INTRAVENOUS | Status: AC
  Administered 2017-02-24: 728 mg via INTRAVENOUS
  Filled 2017-02-24: qty 36.4

## 2017-02-24 MED ORDER — DEXAMETHASONE SODIUM PHOSPHATE 10 MG/ML IJ SOLN
INTRAMUSCULAR | Status: AC
  Filled 2017-02-24: qty 1

## 2017-02-24 MED ORDER — DEXAMETHASONE SODIUM PHOSPHATE 10 MG/ML IJ SOLN
10.0000 mg | Freq: Once | INTRAMUSCULAR | Status: AC
  Administered 2017-02-24: 10 mg via INTRAVENOUS

## 2017-02-24 MED ORDER — FLUOROURACIL CHEMO INJECTION 5 GM/100ML
2400.0000 mg/m2 | INTRAVENOUS | Status: DC
  Administered 2017-02-24: 4350 mg via INTRAVENOUS
  Filled 2017-02-24: qty 87

## 2017-02-24 MED ORDER — SODIUM CHLORIDE 0.9% FLUSH
10.0000 mL | INTRAVENOUS | Status: DC | PRN
  Administered 2017-02-24: 10 mL
  Filled 2017-02-24: qty 10

## 2017-02-24 MED ORDER — OXALIPLATIN CHEMO INJECTION 100 MG/20ML
83.0000 mg/m2 | Freq: Once | INTRAVENOUS | Status: AC
  Administered 2017-02-24: 150 mg via INTRAVENOUS
  Filled 2017-02-24: qty 20

## 2017-02-24 MED ORDER — PALONOSETRON HCL INJECTION 0.25 MG/5ML
0.2500 mg | Freq: Once | INTRAVENOUS | Status: AC
  Administered 2017-02-24: 0.25 mg via INTRAVENOUS

## 2017-02-24 MED ORDER — PALONOSETRON HCL INJECTION 0.25 MG/5ML
INTRAVENOUS | Status: AC
  Filled 2017-02-24: qty 5

## 2017-02-24 MED ORDER — DEXTROSE 5 % IV SOLN
Freq: Once | INTRAVENOUS | Status: AC
  Administered 2017-02-24: 11:00:00 via INTRAVENOUS

## 2017-02-24 MED ORDER — FLUOROURACIL CHEMO INJECTION 2.5 GM/50ML: 400.0000 mg/m2 | Freq: Once | INTRAVENOUS | Status: DC

## 2017-02-24 MED ORDER — SODIUM CHLORIDE 0.9 % IV SOLN
Freq: Once | INTRAVENOUS | Status: AC
  Administered 2017-02-24: 10:00:00 via INTRAVENOUS

## 2017-02-24 NOTE — Telephone Encounter (Signed)
Appointments scheduled per 3/8 LOS. Patient given AVS report and calendars with future scheduled appointments. °

## 2017-02-25 ENCOUNTER — Telehealth: Payer: Self-pay | Admitting: Hematology

## 2017-02-25 NOTE — Telephone Encounter (Signed)
Called patient in regards to scheduled 3/14 IVF appointment scheduled after the patient left scheduling on 3/8. LVM

## 2017-02-26 ENCOUNTER — Ambulatory Visit (HOSPITAL_BASED_OUTPATIENT_CLINIC_OR_DEPARTMENT_OTHER): Payer: Medicaid Other

## 2017-02-26 VITALS — BP 103/73 | HR 57 | Temp 97.7°F | Resp 16

## 2017-02-26 DIAGNOSIS — C20 Malignant neoplasm of rectum: Secondary | ICD-10-CM | POA: Diagnosis not present

## 2017-02-26 MED ORDER — SODIUM CHLORIDE 0.9% FLUSH
10.0000 mL | INTRAVENOUS | Status: DC | PRN
  Administered 2017-02-26: 10 mL
  Filled 2017-02-26: qty 10

## 2017-02-26 MED ORDER — SODIUM CHLORIDE 0.9 % IV SOLN
Freq: Once | INTRAVENOUS | Status: AC
  Administered 2017-02-26: 09:00:00 via INTRAVENOUS

## 2017-02-26 MED ORDER — HEPARIN SOD (PORK) LOCK FLUSH 100 UNIT/ML IV SOLN
500.0000 [IU] | Freq: Once | INTRAVENOUS | Status: AC | PRN
  Administered 2017-02-26: 500 [IU]
  Filled 2017-02-26: qty 5

## 2017-02-26 NOTE — Patient Instructions (Signed)
Dehydration, Adult Dehydration is a condition in which there is not enough fluid or water in the body. This happens when you lose more fluids than you take in. Important organs, such as the kidneys, brain, and heart, cannot function without a proper amount of fluids. Any loss of fluids from the body can lead to dehydration. Dehydration can range from mild to severe. This condition should be treated right away to prevent it from becoming severe. What are the causes? This condition may be caused by:  Vomiting.  Diarrhea.  Excessive sweating, such as from heat exposure or exercise.  Not drinking enough fluid, especially:  When ill.  While doing activity that requires a lot of energy.  Excessive urination.  Fever.  Infection.  Certain medicines, such as medicines that cause the body to lose excess fluid (diuretics).  Inability to access safe drinking water.  Reduced physical ability to get adequate water and food. What increases the risk? This condition is more likely to develop in people:  Who have a poorly controlled long-term (chronic) illness, such as diabetes, heart disease, or kidney disease.  Who are age 65 or older.  Who are disabled.  Who live in a place with high altitude.  Who play endurance sports. What are the signs or symptoms? Symptoms of mild dehydration may include:   Thirst.  Dry lips.  Slightly dry mouth.  Dry, warm skin.  Dizziness. Symptoms of moderate dehydration may include:   Very dry mouth.  Muscle cramps.  Dark urine. Urine may be the color of tea.  Decreased urine production.  Decreased tear production.  Heartbeat that is irregular or faster than normal (palpitations).  Headache.  Light-headedness, especially when you stand up from a sitting position.  Fainting (syncope). Symptoms of severe dehydration may include:   Changes in skin, such as:  Cold and clammy skin.  Blotchy (mottled) or pale skin.  Skin that does  not quickly return to normal after being lightly pinched and released (poor skin turgor).  Changes in body fluids, such as:  Extreme thirst.  No tear production.  Inability to sweat when body temperature is high, such as in hot weather.  Very little urine production.  Changes in vital signs, such as:  Weak pulse.  Pulse that is more than 100 beats a minute when sitting still.  Rapid breathing.  Low blood pressure.  Other changes, such as:  Sunken eyes.  Cold hands and feet.  Confusion.  Lack of energy (lethargy).  Difficulty waking up from sleep.  Short-term weight loss.  Unconsciousness. How is this diagnosed? This condition is diagnosed based on your symptoms and a physical exam. Blood and urine tests may be done to help confirm the diagnosis. How is this treated? Treatment for this condition depends on the severity. Mild or moderate dehydration can often be treated at home. Treatment should be started right away. Do not wait until dehydration becomes severe. Severe dehydration is an emergency and it needs to be treated in a hospital. Treatment for mild dehydration may include:   Drinking more fluids.  Replacing salts and minerals in your blood (electrolytes) that you may have lost. Treatment for moderate dehydration may include:   Drinking an oral rehydration solution (ORS). This is a drink that helps you replace fluids and electrolytes (rehydrate). It can be found at pharmacies and retail stores. Treatment for severe dehydration may include:   Receiving fluids through an IV tube.  Receiving an electrolyte solution through a feeding tube that is   passed through your nose and into your stomach (nasogastric tube, or NG tube).  Correcting any abnormalities in electrolytes.  Treating the underlying cause of dehydration. Follow these instructions at home:  If directed by your health care provider, drink an ORS:  Make an ORS by following instructions on the  package.  Start by drinking small amounts, about  cup (120 mL) every 5-10 minutes.  Slowly increase how much you drink until you have taken the amount recommended by your health care provider.  Drink enough clear fluid to keep your urine clear or pale yellow. If you were told to drink an ORS, finish the ORS first, then start slowly drinking other clear fluids. Drink fluids such as:  Water. Do not drink only water. Doing that can lead to having too little salt (sodium) in the body (hyponatremia).  Ice chips.  Fruit juice that you have added water to (diluted fruit juice).  Low-calorie sports drinks.  Avoid:  Alcohol.  Drinks that contain a lot of sugar. These include high-calorie sports drinks, fruit juice that is not diluted, and soda.  Caffeine.  Foods that are greasy or contain a lot of fat or sugar.  Take over-the-counter and prescription medicines only as told by your health care provider.  Do not take sodium tablets. This can lead to having too much sodium in the body (hypernatremia).  Eat foods that contain a healthy balance of electrolytes, such as bananas, oranges, potatoes, tomatoes, and spinach.  Keep all follow-up visits as told by your health care provider. This is important. Contact a health care provider if:  You have abdominal pain that:  Gets worse.  Stays in one area (localizes).  You have a rash.  You have a stiff neck.  You are more irritable than usual.  You are sleepier or more difficult to wake up than usual.  You feel weak or dizzy.  You feel very thirsty.  You have urinated only a small amount of very dark urine over 6-8 hours. Get help right away if:  You have symptoms of severe dehydration.  You cannot drink fluids without vomiting.  Your symptoms get worse with treatment.  You have a fever.  You have a severe headache.  You have vomiting or diarrhea that:  Gets worse.  Does not go away.  You have blood or green matter  (bile) in your vomit.  You have blood in your stool. This may cause stool to look black and tarry.  You have not urinated in 6-8 hours.  You faint.  Your heart rate while sitting still is over 100 beats a minute.  You have trouble breathing. This information is not intended to replace advice given to you by your health care provider. Make sure you discuss any questions you have with your health care provider. Document Released: 12/06/2005 Document Revised: 07/02/2016 Document Reviewed: 01/30/2016 Elsevier Interactive Patient Education  2017 [employer]  

## 2017-03-02 ENCOUNTER — Encounter (HOSPITAL_COMMUNITY): Payer: Self-pay

## 2017-03-02 ENCOUNTER — Ambulatory Visit (HOSPITAL_BASED_OUTPATIENT_CLINIC_OR_DEPARTMENT_OTHER): Payer: Medicaid Other | Admitting: Nurse Practitioner

## 2017-03-02 ENCOUNTER — Ambulatory Visit: Payer: Medicaid Other

## 2017-03-02 VITALS — BP 124/68 | HR 72 | Temp 97.9°F | Resp 18 | Ht 71.0 in | Wt 144.7 lb

## 2017-03-02 DIAGNOSIS — C20 Malignant neoplasm of rectum: Secondary | ICD-10-CM

## 2017-03-02 DIAGNOSIS — E860 Dehydration: Secondary | ICD-10-CM | POA: Diagnosis present

## 2017-03-02 MED ORDER — SODIUM CHLORIDE 0.9% FLUSH
10.0000 mL | INTRAVENOUS | Status: DC | PRN
  Administered 2017-03-02: 10 mL
  Filled 2017-03-02: qty 10

## 2017-03-02 MED ORDER — SODIUM CHLORIDE 0.9 % IV SOLN
Freq: Once | INTRAVENOUS | Status: AC
  Administered 2017-03-02: 09:00:00 via INTRAVENOUS

## 2017-03-02 MED ORDER — HEPARIN SOD (PORK) LOCK FLUSH 100 UNIT/ML IV SOLN
500.0000 [IU] | Freq: Once | INTRAVENOUS | Status: AC | PRN
  Administered 2017-03-02: 500 [IU]
  Filled 2017-03-02: qty 5

## 2017-03-02 NOTE — Progress Notes (Signed)
RN visit for IV Fluids

## 2017-03-02 NOTE — Patient Instructions (Signed)
Dehydration, Adult Dehydration is a condition in which there is not enough fluid or water in the body. This happens when you lose more fluids than you take in. Important organs, such as the kidneys, brain, and heart, cannot function without a proper amount of fluids. Any loss of fluids from the body can lead to dehydration. Dehydration can range from mild to severe. This condition should be treated right away to prevent it from becoming severe. What are the causes? This condition may be caused by:  Vomiting.  Diarrhea.  Excessive sweating, such as from heat exposure or exercise.  Not drinking enough fluid, especially:  When ill.  While doing activity that requires a lot of energy.  Excessive urination.  Fever.  Infection.  Certain medicines, such as medicines that cause the body to lose excess fluid (diuretics).  Inability to access safe drinking water.  Reduced physical ability to get adequate water and food. What increases the risk? This condition is more likely to develop in people:  Who have a poorly controlled long-term (chronic) illness, such as diabetes, heart disease, or kidney disease.  Who are age 65 or older.  Who are disabled.  Who live in a place with high altitude.  Who play endurance sports. What are the signs or symptoms? Symptoms of mild dehydration may include:   Thirst.  Dry lips.  Slightly dry mouth.  Dry, warm skin.  Dizziness. Symptoms of moderate dehydration may include:   Very dry mouth.  Muscle cramps.  Dark urine. Urine may be the color of tea.  Decreased urine production.  Decreased tear production.  Heartbeat that is irregular or faster than normal (palpitations).  Headache.  Light-headedness, especially when you stand up from a sitting position.  Fainting (syncope). Symptoms of severe dehydration may include:   Changes in skin, such as:  Cold and clammy skin.  Blotchy (mottled) or pale skin.  Skin that does  not quickly return to normal after being lightly pinched and released (poor skin turgor).  Changes in body fluids, such as:  Extreme thirst.  No tear production.  Inability to sweat when body temperature is high, such as in hot weather.  Very little urine production.  Changes in vital signs, such as:  Weak pulse.  Pulse that is more than 100 beats a minute when sitting still.  Rapid breathing.  Low blood pressure.  Other changes, such as:  Sunken eyes.  Cold hands and feet.  Confusion.  Lack of energy (lethargy).  Difficulty waking up from sleep.  Short-term weight loss.  Unconsciousness. How is this diagnosed? This condition is diagnosed based on your symptoms and a physical exam. Blood and urine tests may be done to help confirm the diagnosis. How is this treated? Treatment for this condition depends on the severity. Mild or moderate dehydration can often be treated at home. Treatment should be started right away. Do not wait until dehydration becomes severe. Severe dehydration is an emergency and it needs to be treated in a hospital. Treatment for mild dehydration may include:   Drinking more fluids.  Replacing salts and minerals in your blood (electrolytes) that you may have lost. Treatment for moderate dehydration may include:   Drinking an oral rehydration solution (ORS). This is a drink that helps you replace fluids and electrolytes (rehydrate). It can be found at pharmacies and retail stores. Treatment for severe dehydration may include:   Receiving fluids through an IV tube.  Receiving an electrolyte solution through a feeding tube that is   passed through your nose and into your stomach (nasogastric tube, or NG tube).  Correcting any abnormalities in electrolytes.  Treating the underlying cause of dehydration. Follow these instructions at home:  If directed by your health care provider, drink an ORS:  Make an ORS by following instructions on the  package.  Start by drinking small amounts, about  cup (120 mL) every 5-10 minutes.  Slowly increase how much you drink until you have taken the amount recommended by your health care provider.  Drink enough clear fluid to keep your urine clear or pale yellow. If you were told to drink an ORS, finish the ORS first, then start slowly drinking other clear fluids. Drink fluids such as:  Water. Do not drink only water. Doing that can lead to having too little salt (sodium) in the body (hyponatremia).  Ice chips.  Fruit juice that you have added water to (diluted fruit juice).  Low-calorie sports drinks.  Avoid:  Alcohol.  Drinks that contain a lot of sugar. These include high-calorie sports drinks, fruit juice that is not diluted, and soda.  Caffeine.  Foods that are greasy or contain a lot of fat or sugar.  Take over-the-counter and prescription medicines only as told by your health care provider.  Do not take sodium tablets. This can lead to having too much sodium in the body (hypernatremia).  Eat foods that contain a healthy balance of electrolytes, such as bananas, oranges, potatoes, tomatoes, and spinach.  Keep all follow-up visits as told by your health care provider. This is important. Contact a health care provider if:  You have abdominal pain that:  Gets worse.  Stays in one area (localizes).  You have a rash.  You have a stiff neck.  You are more irritable than usual.  You are sleepier or more difficult to wake up than usual.  You feel weak or dizzy.  You feel very thirsty.  You have urinated only a small amount of very dark urine over 6-8 hours. Get help right away if:  You have symptoms of severe dehydration.  You cannot drink fluids without vomiting.  Your symptoms get worse with treatment.  You have a fever.  You have a severe headache.  You have vomiting or diarrhea that:  Gets worse.  Does not go away.  You have blood or green matter  (bile) in your vomit.  You have blood in your stool. This may cause stool to look black and tarry.  You have not urinated in 6-8 hours.  You faint.  Your heart rate while sitting still is over 100 beats a minute.  You have trouble breathing. This information is not intended to replace advice given to you by your health care provider. Make sure you discuss any questions you have with your health care provider. Document Released: 12/06/2005 Document Revised: 07/02/2016 Document Reviewed: 01/30/2016 Elsevier Interactive Patient Education  2017 [employer]  

## 2017-03-04 ENCOUNTER — Ambulatory Visit: Payer: Medicaid Other | Admitting: Hematology

## 2017-03-10 ENCOUNTER — Ambulatory Visit: Payer: Medicaid Other

## 2017-03-10 ENCOUNTER — Ambulatory Visit (HOSPITAL_BASED_OUTPATIENT_CLINIC_OR_DEPARTMENT_OTHER): Payer: Medicaid Other

## 2017-03-10 ENCOUNTER — Other Ambulatory Visit (HOSPITAL_BASED_OUTPATIENT_CLINIC_OR_DEPARTMENT_OTHER): Payer: Medicaid Other

## 2017-03-10 VITALS — BP 106/64 | HR 68 | Temp 97.9°F | Resp 17

## 2017-03-10 DIAGNOSIS — Z5111 Encounter for antineoplastic chemotherapy: Secondary | ICD-10-CM

## 2017-03-10 DIAGNOSIS — C20 Malignant neoplasm of rectum: Secondary | ICD-10-CM

## 2017-03-10 LAB — CBC WITH DIFFERENTIAL/PLATELET
BASO%: 1 % (ref 0.0–2.0)
BASOS ABS: 0.1 10*3/uL (ref 0.0–0.1)
EOS ABS: 0.1 10*3/uL (ref 0.0–0.5)
EOS%: 1.5 % (ref 0.0–7.0)
HEMATOCRIT: 44.3 % (ref 38.4–49.9)
HGB: 14.5 g/dL (ref 13.0–17.1)
LYMPH#: 0.7 10*3/uL — AB (ref 0.9–3.3)
LYMPH%: 9.4 % — ABNORMAL LOW (ref 14.0–49.0)
MCH: 27.8 pg (ref 27.2–33.4)
MCHC: 32.6 g/dL (ref 32.0–36.0)
MCV: 85.2 fL (ref 79.3–98.0)
MONO#: 0.7 10*3/uL (ref 0.1–0.9)
MONO%: 10.1 % (ref 0.0–14.0)
NEUT#: 5.6 10*3/uL (ref 1.5–6.5)
NEUT%: 78 % — AB (ref 39.0–75.0)
PLATELETS: 120 10*3/uL — AB (ref 140–400)
RBC: 5.21 10*6/uL (ref 4.20–5.82)
RDW: 17.6 % — ABNORMAL HIGH (ref 11.0–14.6)
WBC: 7.2 10*3/uL (ref 4.0–10.3)

## 2017-03-10 LAB — COMPREHENSIVE METABOLIC PANEL
ALBUMIN: 2.3 g/dL — AB (ref 3.5–5.0)
ALK PHOS: 58 U/L (ref 40–150)
ALT: 15 U/L (ref 0–55)
ANION GAP: 5 meq/L (ref 3–11)
AST: 26 U/L (ref 5–34)
BUN: 11.1 mg/dL (ref 7.0–26.0)
CALCIUM: 8.3 mg/dL — AB (ref 8.4–10.4)
CHLORIDE: 112 meq/L — AB (ref 98–109)
CO2: 21 mEq/L — ABNORMAL LOW (ref 22–29)
Creatinine: 0.9 mg/dL (ref 0.7–1.3)
EGFR: >90 mL/min/{1.73_m2} (ref >90)
Glucose: 101 mg/dl (ref 70–140)
POTASSIUM: 4.4 meq/L (ref 3.5–5.1)
Sodium: 138 mEq/L (ref 136–145)
Total Bilirubin: <0.22 mg/dL (ref 0.20–1.20)
Total Protein: 4.8 g/dL — ABNORMAL LOW (ref 6.4–8.3)

## 2017-03-10 LAB — TECHNOLOGIST REVIEW

## 2017-03-10 MED ORDER — SODIUM CHLORIDE 0.9 % IV SOLN
2400.0000 mg/m2 | INTRAVENOUS | Status: DC
  Administered 2017-03-10: 4350 mg via INTRAVENOUS
  Filled 2017-03-10: qty 87

## 2017-03-10 MED ORDER — OXALIPLATIN CHEMO INJECTION 100 MG/20ML
83.0000 mg/m2 | Freq: Once | INTRAVENOUS | Status: AC
  Administered 2017-03-10: 150 mg via INTRAVENOUS
  Filled 2017-03-10: qty 10

## 2017-03-10 MED ORDER — DEXAMETHASONE SODIUM PHOSPHATE 10 MG/ML IJ SOLN
10.0000 mg | Freq: Once | INTRAMUSCULAR | Status: AC
  Administered 2017-03-10: 10 mg via INTRAVENOUS

## 2017-03-10 MED ORDER — DEXTROSE 5 % IV SOLN
400.0000 mg/m2 | Freq: Once | INTRAVENOUS | Status: AC
  Administered 2017-03-10: 728 mg via INTRAVENOUS
  Filled 2017-03-10: qty 36.4

## 2017-03-10 MED ORDER — DEXAMETHASONE SODIUM PHOSPHATE 10 MG/ML IJ SOLN
INTRAMUSCULAR | Status: AC
  Filled 2017-03-10: qty 1

## 2017-03-10 MED ORDER — PALONOSETRON HCL INJECTION 0.25 MG/5ML
INTRAVENOUS | Status: AC
  Filled 2017-03-10: qty 5

## 2017-03-10 MED ORDER — PALONOSETRON HCL INJECTION 0.25 MG/5ML
0.2500 mg | Freq: Once | INTRAVENOUS | Status: AC
  Administered 2017-03-10: 0.25 mg via INTRAVENOUS

## 2017-03-10 MED ORDER — DEXTROSE 5 % IV SOLN
Freq: Once | INTRAVENOUS | Status: AC
  Administered 2017-03-10: 09:00:00 via INTRAVENOUS

## 2017-03-10 MED ORDER — FLUOROURACIL CHEMO INJECTION 2.5 GM/50ML: 400.0000 mg/m2 | Freq: Once | INTRAVENOUS | Status: DC

## 2017-03-10 NOTE — Patient Instructions (Signed)
Implanted Port Home Guide An implanted port is a type of central line that is placed under the skin. Central lines are used to provide IV access when treatment or nutrition needs to be given through a person's veins. Implanted ports are used for long-term IV access. An implanted port may be placed because:  You need IV medicine that would be irritating to the small veins in your hands or arms.  You need long-term IV medicines, such as antibiotics.  You need IV nutrition for a long period.  You need frequent blood draws for lab tests.  You need dialysis.  Implanted ports are usually placed in the chest area, but they can also be placed in the upper arm, the abdomen, or the leg. An implanted port has two main parts:  Reservoir. The reservoir is round and will appear as a small, raised area under your skin. The reservoir is the part where a needle is inserted to give medicines or draw blood.  Catheter. The catheter is a thin, flexible tube that extends from the reservoir. The catheter is placed into a large vein. Medicine that is inserted into the reservoir goes into the catheter and then into the vein.  How will I care for my incision site? Do not get the incision site wet. Bathe or shower as directed by your health care provider. How is my port accessed? Special steps must be taken to access the port:  Before the port is accessed, a numbing cream can be placed on the skin. This helps numb the skin over the port site.  Your health care provider uses a sterile technique to access the port. ? Your health care provider must put on a mask and sterile gloves. ? The skin over your port is cleaned carefully with an antiseptic and allowed to dry. ? The port is gently pinched between sterile gloves, and a needle is inserted into the port.  Only "non-coring" port needles should be used to access the port. Once the port is accessed, a blood return should be checked. This helps ensure that the port  is in the vein and is not clogged.  If your port needs to remain accessed for a constant infusion, a clear (transparent) bandage will be placed over the needle site. The bandage and needle will need to be changed every week, or as directed by your health care provider.  Keep the bandage covering the needle clean and dry. Do not get it wet. Follow your health care provider's instructions on how to take a shower or bath while the port is accessed.  If your port does not need to stay accessed, no bandage is needed over the port.  What is flushing? Flushing helps keep the port from getting clogged. Follow your health care provider's instructions on how and when to flush the port. Ports are usually flushed with saline solution or a medicine called heparin. The need for flushing will depend on how the port is used.  If the port is used for intermittent medicines or blood draws, the port will need to be flushed: ? After medicines have been given. ? After blood has been drawn. ? As part of routine maintenance.  If a constant infusion is running, the port may not need to be flushed.  How long will my port stay implanted? The port can stay in for as long as your health care provider thinks it is needed. When it is time for the port to come out, surgery will be   done to remove it. The procedure is similar to the one performed when the port was put in. When should I seek immediate medical care? When you have an implanted port, you should seek immediate medical care if:  You notice a bad smell coming from the incision site.  You have swelling, redness, or drainage at the incision site.  You have more swelling or pain at the port site or the surrounding area.  You have a fever that is not controlled with medicine.  This information is not intended to replace advice given to you by your health care provider. Make sure you discuss any questions you have with your health care provider. Document  Released: 12/06/2005 Document Revised: 05/13/2016 Document Reviewed: 08/13/2013 Elsevier Interactive Patient Education  2017 Elsevier Inc.  

## 2017-03-10 NOTE — Patient Instructions (Signed)
[facility] Discharge Instructions for Patients Receiving Chemotherapy  Today you received the following chemotherapy agents: oxaliplatin/leucovirn/florouracil.  To help prevent nausea and vomiting after your treatment, we encourage you to take your nausea medication as directed.     If you develop nausea and vomiting that is not controlled by your nausea medication, call the clinic.   BELOW ARE SYMPTOMS THAT SHOULD BE REPORTED IMMEDIATELY:  *FEVER GREATER THAN 100.5 F  *CHILLS WITH OR WITHOUT FEVER  NAUSEA AND VOMITING THAT IS NOT CONTROLLED WITH YOUR NAUSEA MEDICATION  *UNUSUAL SHORTNESS OF BREATH  *UNUSUAL BRUISING OR BLEEDING  TENDERNESS IN MOUTH AND THROAT WITH OR WITHOUT PRESENCE OF ULCERS  *URINARY PROBLEMS  *BOWEL PROBLEMS  UNUSUAL RASH Items with * indicate a potential emergency and should be followed up as soon as possible.  Feel free to call the clinic you have any questions or concerns. The clinic phone number is [phone].   

## 2017-03-12 ENCOUNTER — Ambulatory Visit (HOSPITAL_BASED_OUTPATIENT_CLINIC_OR_DEPARTMENT_OTHER): Payer: Medicaid Other

## 2017-03-12 VITALS — BP 111/82 | HR 53 | Temp 96.9°F | Resp 17

## 2017-03-12 DIAGNOSIS — C20 Malignant neoplasm of rectum: Secondary | ICD-10-CM | POA: Diagnosis present

## 2017-03-12 MED ORDER — SODIUM CHLORIDE 0.9% FLUSH
10.0000 mL | INTRAVENOUS | Status: DC | PRN
  Administered 2017-03-12: 10 mL
  Filled 2017-03-12: qty 10

## 2017-03-12 MED ORDER — SODIUM CHLORIDE 0.9 % IV SOLN
Freq: Once | INTRAVENOUS | Status: AC
  Administered 2017-03-12: 09:00:00 via INTRAVENOUS

## 2017-03-12 MED ORDER — HEPARIN SOD (PORK) LOCK FLUSH 100 UNIT/ML IV SOLN
500.0000 [IU] | Freq: Once | INTRAVENOUS | Status: AC | PRN
  Administered 2017-03-12: 500 [IU]
  Filled 2017-03-12: qty 5

## 2017-03-12 NOTE — Patient Instructions (Signed)
Dehydration, Adult Dehydration is a condition in which there is not enough fluid or water in the body. This happens when you lose more fluids than you take in. Important organs, such as the kidneys, brain, and heart, cannot function without a proper amount of fluids. Any loss of fluids from the body can lead to dehydration. Dehydration can range from mild to severe. This condition should be treated right away to prevent it from becoming severe. What are the causes? This condition may be caused by:  Vomiting.  Diarrhea.  Excessive sweating, such as from heat exposure or exercise.  Not drinking enough fluid, especially:  When ill.  While doing activity that requires a lot of energy.  Excessive urination.  Fever.  Infection.  Certain medicines, such as medicines that cause the body to lose excess fluid (diuretics).  Inability to access safe drinking water.  Reduced physical ability to get adequate water and food. What increases the risk? This condition is more likely to develop in people:  Who have a poorly controlled long-term (chronic) illness, such as diabetes, heart disease, or kidney disease.  Who are age 65 or older.  Who are disabled.  Who live in a place with high altitude.  Who play endurance sports. What are the signs or symptoms? Symptoms of mild dehydration may include:   Thirst.  Dry lips.  Slightly dry mouth.  Dry, warm skin.  Dizziness. Symptoms of moderate dehydration may include:   Very dry mouth.  Muscle cramps.  Dark urine. Urine may be the color of tea.  Decreased urine production.  Decreased tear production.  Heartbeat that is irregular or faster than normal (palpitations).  Headache.  Light-headedness, especially when you stand up from a sitting position.  Fainting (syncope). Symptoms of severe dehydration may include:   Changes in skin, such as:  Cold and clammy skin.  Blotchy (mottled) or pale skin.  Skin that does  not quickly return to normal after being lightly pinched and released (poor skin turgor).  Changes in body fluids, such as:  Extreme thirst.  No tear production.  Inability to sweat when body temperature is high, such as in hot weather.  Very little urine production.  Changes in vital signs, such as:  Weak pulse.  Pulse that is more than 100 beats a minute when sitting still.  Rapid breathing.  Low blood pressure.  Other changes, such as:  Sunken eyes.  Cold hands and feet.  Confusion.  Lack of energy (lethargy).  Difficulty waking up from sleep.  Short-term weight loss.  Unconsciousness. How is this diagnosed? This condition is diagnosed based on your symptoms and a physical exam. Blood and urine tests may be done to help confirm the diagnosis. How is this treated? Treatment for this condition depends on the severity. Mild or moderate dehydration can often be treated at home. Treatment should be started right away. Do not wait until dehydration becomes severe. Severe dehydration is an emergency and it needs to be treated in a hospital. Treatment for mild dehydration may include:   Drinking more fluids.  Replacing salts and minerals in your blood (electrolytes) that you may have lost. Treatment for moderate dehydration may include:   Drinking an oral rehydration solution (ORS). This is a drink that helps you replace fluids and electrolytes (rehydrate). It can be found at pharmacies and retail stores. Treatment for severe dehydration may include:   Receiving fluids through an IV tube.  Receiving an electrolyte solution through a feeding tube that is   passed through your nose and into your stomach (nasogastric tube, or NG tube).  Correcting any abnormalities in electrolytes.  Treating the underlying cause of dehydration. Follow these instructions at home:  If directed by your health care provider, drink an ORS:  Make an ORS by following instructions on the  package.  Start by drinking small amounts, about  cup (120 mL) every 5-10 minutes.  Slowly increase how much you drink until you have taken the amount recommended by your health care provider.  Drink enough clear fluid to keep your urine clear or pale yellow. If you were told to drink an ORS, finish the ORS first, then start slowly drinking other clear fluids. Drink fluids such as:  Water. Do not drink only water. Doing that can lead to having too little salt (sodium) in the body (hyponatremia).  Ice chips.  Fruit juice that you have added water to (diluted fruit juice).  Low-calorie sports drinks.  Avoid:  Alcohol.  Drinks that contain a lot of sugar. These include high-calorie sports drinks, fruit juice that is not diluted, and soda.  Caffeine.  Foods that are greasy or contain a lot of fat or sugar.  Take over-the-counter and prescription medicines only as told by your health care provider.  Do not take sodium tablets. This can lead to having too much sodium in the body (hypernatremia).  Eat foods that contain a healthy balance of electrolytes, such as bananas, oranges, potatoes, tomatoes, and spinach.  Keep all follow-up visits as told by your health care provider. This is important. Contact a health care provider if:  You have abdominal pain that:  Gets worse.  Stays in one area (localizes).  You have a rash.  You have a stiff neck.  You are more irritable than usual.  You are sleepier or more difficult to wake up than usual.  You feel weak or dizzy.  You feel very thirsty.  You have urinated only a small amount of very dark urine over 6-8 hours. Get help right away if:  You have symptoms of severe dehydration.  You cannot drink fluids without vomiting.  Your symptoms get worse with treatment.  You have a fever.  You have a severe headache.  You have vomiting or diarrhea that:  Gets worse.  Does not go away.  You have blood or green matter  (bile) in your vomit.  You have blood in your stool. This may cause stool to look black and tarry.  You have not urinated in 6-8 hours.  You faint.  Your heart rate while sitting still is over 100 beats a minute.  You have trouble breathing. This information is not intended to replace advice given to you by your health care provider. Make sure you discuss any questions you have with your health care provider. Document Released: 12/06/2005 Document Revised: 07/02/2016 Document Reviewed: 01/30/2016 Elsevier Interactive Patient Education  2017 [employer]  

## 2017-03-15 ENCOUNTER — Telehealth: Payer: Self-pay | Admitting: Hematology

## 2017-03-15 NOTE — Telephone Encounter (Signed)
Spoke with wife re appointments for 3/28 and 4/5.

## 2017-03-16 ENCOUNTER — Ambulatory Visit (HOSPITAL_BASED_OUTPATIENT_CLINIC_OR_DEPARTMENT_OTHER): Payer: Medicaid Other | Admitting: Nurse Practitioner

## 2017-03-16 VITALS — BP 118/76 | HR 89 | Temp 98.0°F | Resp 18 | Ht 71.0 in | Wt 136.4 lb

## 2017-03-16 DIAGNOSIS — C20 Malignant neoplasm of rectum: Secondary | ICD-10-CM | POA: Diagnosis not present

## 2017-03-16 MED ORDER — HEPARIN SOD (PORK) LOCK FLUSH 100 UNIT/ML IV SOLN
500.0000 [IU] | Freq: Once | INTRAVENOUS | Status: AC | PRN
  Administered 2017-03-16: 500 [IU]
  Filled 2017-03-16: qty 5

## 2017-03-16 MED ORDER — SODIUM CHLORIDE 0.9 % IV SOLN
Freq: Once | INTRAVENOUS | Status: AC
  Administered 2017-03-16: 10:00:00 via INTRAVENOUS

## 2017-03-16 MED ORDER — SODIUM CHLORIDE 0.9% FLUSH
10.0000 mL | INTRAVENOUS | Status: DC | PRN
  Administered 2017-03-16: 10 mL
  Filled 2017-03-16: qty 10

## 2017-03-16 NOTE — Progress Notes (Signed)
RN visit for IV fluids. OK to infuse @ 750/hr per Dr. Burr Medico

## 2017-03-16 NOTE — Patient Instructions (Signed)
Dehydration, Adult Dehydration is a condition in which there is not enough fluid or water in the body. This happens when you lose more fluids than you take in. Important organs, such as the kidneys, brain, and heart, cannot function without a proper amount of fluids. Any loss of fluids from the body can lead to dehydration. Dehydration can range from mild to severe. This condition should be treated right away to prevent it from becoming severe. What are the causes? This condition may be caused by:  Vomiting.  Diarrhea.  Excessive sweating, such as from heat exposure or exercise.  Not drinking enough fluid, especially:  When ill.  While doing activity that requires a lot of energy.  Excessive urination.  Fever.  Infection.  Certain medicines, such as medicines that cause the body to lose excess fluid (diuretics).  Inability to access safe drinking water.  Reduced physical ability to get adequate water and food. What increases the risk? This condition is more likely to develop in people:  Who have a poorly controlled long-term (chronic) illness, such as diabetes, heart disease, or kidney disease.  Who are age 65 or older.  Who are disabled.  Who live in a place with high altitude.  Who play endurance sports. What are the signs or symptoms? Symptoms of mild dehydration may include:   Thirst.  Dry lips.  Slightly dry mouth.  Dry, warm skin.  Dizziness. Symptoms of moderate dehydration may include:   Very dry mouth.  Muscle cramps.  Dark urine. Urine may be the color of tea.  Decreased urine production.  Decreased tear production.  Heartbeat that is irregular or faster than normal (palpitations).  Headache.  Light-headedness, especially when you stand up from a sitting position.  Fainting (syncope). Symptoms of severe dehydration may include:   Changes in skin, such as:  Cold and clammy skin.  Blotchy (mottled) or pale skin.  Skin that does  not quickly return to normal after being lightly pinched and released (poor skin turgor).  Changes in body fluids, such as:  Extreme thirst.  No tear production.  Inability to sweat when body temperature is high, such as in hot weather.  Very little urine production.  Changes in vital signs, such as:  Weak pulse.  Pulse that is more than 100 beats a minute when sitting still.  Rapid breathing.  Low blood pressure.  Other changes, such as:  Sunken eyes.  Cold hands and feet.  Confusion.  Lack of energy (lethargy).  Difficulty waking up from sleep.  Short-term weight loss.  Unconsciousness. How is this diagnosed? This condition is diagnosed based on your symptoms and a physical exam. Blood and urine tests may be done to help confirm the diagnosis. How is this treated? Treatment for this condition depends on the severity. Mild or moderate dehydration can often be treated at home. Treatment should be started right away. Do not wait until dehydration becomes severe. Severe dehydration is an emergency and it needs to be treated in a hospital. Treatment for mild dehydration may include:   Drinking more fluids.  Replacing salts and minerals in your blood (electrolytes) that you may have lost. Treatment for moderate dehydration may include:   Drinking an oral rehydration solution (ORS). This is a drink that helps you replace fluids and electrolytes (rehydrate). It can be found at pharmacies and retail stores. Treatment for severe dehydration may include:   Receiving fluids through an IV tube.  Receiving an electrolyte solution through a feeding tube that is   passed through your nose and into your stomach (nasogastric tube, or NG tube).  Correcting any abnormalities in electrolytes.  Treating the underlying cause of dehydration. Follow these instructions at home:  If directed by your health care provider, drink an ORS:  Make an ORS by following instructions on the  package.  Start by drinking small amounts, about  cup (120 mL) every 5-10 minutes.  Slowly increase how much you drink until you have taken the amount recommended by your health care provider.  Drink enough clear fluid to keep your urine clear or pale yellow. If you were told to drink an ORS, finish the ORS first, then start slowly drinking other clear fluids. Drink fluids such as:  Water. Do not drink only water. Doing that can lead to having too little salt (sodium) in the body (hyponatremia).  Ice chips.  Fruit juice that you have added water to (diluted fruit juice).  Low-calorie sports drinks.  Avoid:  Alcohol.  Drinks that contain a lot of sugar. These include high-calorie sports drinks, fruit juice that is not diluted, and soda.  Caffeine.  Foods that are greasy or contain a lot of fat or sugar.  Take over-the-counter and prescription medicines only as told by your health care provider.  Do not take sodium tablets. This can lead to having too much sodium in the body (hypernatremia).  Eat foods that contain a healthy balance of electrolytes, such as bananas, oranges, potatoes, tomatoes, and spinach.  Keep all follow-up visits as told by your health care provider. This is important. Contact a health care provider if:  You have abdominal pain that:  Gets worse.  Stays in one area (localizes).  You have a rash.  You have a stiff neck.  You are more irritable than usual.  You are sleepier or more difficult to wake up than usual.  You feel weak or dizzy.  You feel very thirsty.  You have urinated only a small amount of very dark urine over 6-8 hours. Get help right away if:  You have symptoms of severe dehydration.  You cannot drink fluids without vomiting.  Your symptoms get worse with treatment.  You have a fever.  You have a severe headache.  You have vomiting or diarrhea that:  Gets worse.  Does not go away.  You have blood or green matter  (bile) in your vomit.  You have blood in your stool. This may cause stool to look black and tarry.  You have not urinated in 6-8 hours.  You faint.  Your heart rate while sitting still is over 100 beats a minute.  You have trouble breathing. This information is not intended to replace advice given to you by your health care provider. Make sure you discuss any questions you have with your health care provider. Document Released: 12/06/2005 Document Revised: 07/02/2016 Document Reviewed: 01/30/2016 Elsevier Interactive Patient Education  2017 [employer]  

## 2017-03-17 NOTE — Progress Notes (Signed)
[facility]  Telephone:([phone]) [phone] [phone]  Clinic follow Up Note   Patient Care Team: [name] as PCP - General (General Practice) Wandalee Ferdinand, RN as Registered Nurse Karie Soda, MD as Consulting Physician (General Surgery) Sherrilyn Rist, MD as Consulting Physician (Gastroenterology) Dorothy Puffer, MD as Consulting Physician (Radiation Oncology) Malachy Mood, MD as Consulting Physician (Oncology) 03/24/2017  CHIEF COMPLAINTS:  Follow up rectal cancer  Oncology History   Rectal adenocarcinoma [facility])   Staging form: Colon and Rectum, AJCC 7th Edition   - Clinical stage from 07/28/2016: Stage IIIB (T3, N2a, M0) - Signed by Malachy Mood, MD on 08/06/2016      Rectal adenocarcinoma s/p protctocolectomy, IPAA "J" pouch 12/01/2016   07/26/2016 Imaging    CT chest, abdomen and pelvis with contrast showed nodular thickening of the rectal wall, enlarged perirectal lymph nodes. Diverticulosis. Nonspecific low-level lymphadenopathy. Lower lobe emphysema, small 3-4 mm left lower lobe lung nodules.      07/28/2016 Initial Diagnosis    Rectal adenocarcinoma (HCC)      07/28/2016 Initial Biopsy    Rectal mass biopsy showed invasive adenocarcinoma, polyps in the ascending colon and rectum showed tubular adenoma       07/28/2016 Procedure    Colonoscopy showed a nearly circumferential mass in the distal rectum, 4 polyps in the descending colon, diverticulosis in the left colon.       07/29/2016 Procedure    Low EUS showed clearly malignant 6 cm long, nearly circumferential mass in the distal rectum, with distal edge located to 1 cm from the annual approach, multiple prior rectal lymph nodes (6) are suspicious ,uT3 N2       08/17/2016 - 09/24/2016 Radiation Therapy    Neoadjuvant radiation to his rectal cancer 1) Rectum/ 45 Gy in 25 fx                      2) Boost/ 5.4 Gy in 3 fx       08/17/2016 - 09/24/2016 Chemotherapy    Xeloda 1500 mg twice daily with  concurrent irradiation      08/18/2016 Imaging    PET scan showed hypermetabolic rectal mass, perirectal node 75mm with mild increased uptake, no hypermetabolic distant metastasis, small pulmonary nodules are too small to characterize by PET.       11/29/2016 Imaging    CT of the C/A/P W Contrast IMPRESSION: 1. Interval treatment changes within the perirectal fat with otherwise stable rectosigmoid colon wall thickening. 2. No progressive adenopathy or distant metastases identified. 3. Stable bilateral pulmonary nodules. Short-term stability (for 4 months) is reassuring, although the nodules are too small to optimally characterize by previous PET-CT. Continued CT follow-up recommended.      12/01/2016 Surgery    Colon total resection and proctocolectomy for rectal cancer and Lynch syndrome       12/01/2016 Pathology Results    Proctocolectomy showed scattered small residual foci of invasive moderately differentiated adenocarcinoma embedded in submucosa tissue with extensive neoadjuvant effect, 2.5 cm in greatest time mention, adenocarcinoma invades through muscularis mucosa and involves subserosal soft tissue, margins are negative, 2 of 35 lymph nodes are positive for metastatic adenocarcinoma, 2 calcified and necrotic soft tissue nodules are also present without visible tumor seen.      01/07/2017 -  Chemotherapy    Adjuvant chemo CAPOX, changed to FOLFOX from cycle 2 due to poor tolerance        01/26/2017 Surgery  PLACEMENT OF PORT-A-CATH CENTRAL LINE WITH FLUOROSCOPY AND ULTRASOUND       HISTORY OF PRESENTING ILLNESS:  [name] 46 y.o. male is here because of His newly diagnosed rectal cancer. He is accompanied by his girlfriend to our multidisciplinary GI clinic today.  He has had rectal pain for 6 weeks, and mild rectal bleeding with BM, he goes 4 times a day, very little each time, and pencil shaped stool, he has lost 30 lbs so far. His appettie remains to be good, he  eats well, no cough, nausea, no abd pain. He has a moderate fatigue, he will 2 tolerate his routine activities including his job without much difficulty. He does not have a primary care physician, and has not seen doctors for many years.  He presents to emergency room on 07/26/2016 for the rectal pain and bleeding, and was referred to gastroenterologist Dr. Loletha Carrow. He underwent colonoscopy on 07/28/2016, which showed 4 polyps in the ascending colon, and a circumferential distal rectal mass. Biopsy of the rectal mass showed adenocarcinoma. He underwent EUS by Dr. Ardis Hughs on 07/29/2016 which showed a T3 N2 disease.   CURRENT THERAPY: adjuvant chemo CAPOX, changed to FOLFOX from cycle 2   INTERIM HISTORY  [name] returns for follow-up and cycle 5 chemo. He is accompanied by a friend and is doing well. He reports his diarrhea has decreased and he is doing better with it after taking Lomotil. He is not taking Oxycodone anymore for pain.Denies numbness and tingling but admits to having cold sensations.  MEDICAL HISTORY:  Past Medical History:  Diagnosis Date  . Cancer ([place]) 12/01/2016   colon   . Family history of colon cancer   . Poor dental hygiene        SURGICAL HISTORY: Past Surgical History:  Procedure Laterality Date  . EUS N/A 07/29/2016   Procedure: LOWER ENDOSCOPIC ULTRASOUND (EUS);  Surgeon: Milus Banister, MD;  Location: [facility];  Service: Endoscopy;  Laterality: N/A;  . PORTACATH PLACEMENT N/A 01/26/2017   Procedure: PLACEMENT OF PORT-A-CATH CENTRAL LINE WITH FLUOROSCOPY AND ULTRASOUND;  Surgeon: Michael Boston, MD;  Location: [facility];  Service: General;  Laterality: N/A;  . PROCTOSCOPY N/A 12/01/2016   Procedure: RIGID PROCTOSCOPY;  Surgeon: Michael Boston, MD;  Location: [facility];  Service: General;  Laterality: N/A;  . TOE AMPUTATION Left   . XI ROBOTIC ASSISTED LOWER ANTERIOR RESECTION N/A 12/01/2016   Procedure: XI ROBOTIC ASSISTED PROCTOCOLECTOMY WITH  ILLEOPOUCH ANASTAMOSIS WITH DIVERTING ILLEOSTOMY;  Surgeon: Michael Boston, MD;  Location: [facility];  Service: General;  Laterality: N/A;    SOCIAL HISTORY: Social History   Social History  . Marital status: Married    Spouse name: N/A  . Number of children: 3  . Years of education: N/A   Occupational History  . Not on file.   Social History Main Topics  . Smoking status: Former Smoker    Packs/day: 1.50    Years: 30.00    Types: Cigarettes    Quit date: 12/01/2016  . Smokeless tobacco: Never Used  . Alcohol use No  . Drug use: No     Comment: patient denies  . Sexual activity: Not on file   Other Topics Concern  . Not on file   Social History Narrative   Lives with significant other, [name]   Have a 37 year old son   Smoker      [place] HISTORY: Family  History  Problem Relation Age of Onset  . Diabetes Mother   . COPD Father   . Colon cancer Maternal Aunt     dx in her 33s  . Diabetes Maternal Grandmother   . Brain cancer Maternal Grandfather   . Lung cancer Paternal Grandfather   . Colon cancer Cousin     dx in her 55s  . Bone cancer Sister 8  . Pancreatic cancer Neg Hx   . Esophageal cancer Neg Hx   . Stomach cancer Neg Hx   . Liver disease Neg Hx    He lives with his girfriend and their 69 yo son, works for [employer]   ALLERGIES:  has No Known Allergies.  MEDICATIONS:  Current Outpatient Prescriptions  Medication Sig Dispense Refill  . ALPRAZolam (XANAX) 0.5 MG tablet Take 0.5 tablets (0.25 mg total) by mouth at bedtime as needed for anxiety. For anxiety 20 tablet 0  . diphenoxylate-atropine (LOMOTIL) 2.5-0.025 MG tablet Take 1-2 tablets by mouth 4 (four) times daily as needed for diarrhea or loose stools. 60 tablet 1  . naproxen sodium (ALEVE) 220 MG tablet Take 440 mg by mouth 2 (two) times daily as needed (for pain.).    Marland Kitchen ondansetron (ZOFRAN) 8 MG tablet Take 8 mg by mouth as needed.  1  . polyethylene glycol  (MIRALAX / GLYCOLAX) packet Take 17 g by mouth as needed.     . prochlorperazine (COMPAZINE) 10 MG tablet Take 1 tablet (10 mg total) by mouth every 6 (six) hours as needed for nausea or vomiting. 30 tablet 2  . traMADol (ULTRAM) 50 MG tablet Take 1-2 tablets (50-100 mg total) by mouth every 6 (six) hours as needed for moderate pain or severe pain (for pain.). 30 tablet 0   Current Facility-Administered Medications  Medication Dose Route Frequency Provider Last Rate Last Dose  . 0.9 %  sodium chloride infusion  500 mL Intravenous Continuous Nelida Meuse III, MD        REVIEW OF SYSTEMS:  Constitutional: Denies fevers, chills or abnormal night sweats Eyes: Denies blurriness of vision, double vision or watery eyes Ears, nose, mouth, throat, and face: Denies mucositis or sore throat Respiratory: Denies cough, dyspnea or wheezes Cardiovascular: Denies palpitation, chest discomfort or lower extremity swelling Gastrointestinal:  Denies heartburn (+) improved diarrhea Skin: Denies abnormal skin rashes Lymphatics: Denies new lymphadenopathy or easy bruising Neurological:Denies numbness, tingling or new weaknesses (+)cold sensations Behavioral/Psych: Mood is stable, no new changes  All other systems were reviewed with the patient and are negative.  PHYSICAL EXAMINATION:  ECOG PERFORMANCE STATUS: 1  Vitals:   03/24/17 0844  BP: 94/70  Pulse: 74  Resp: 18  Temp: 97.6 F (36.4 C)   Filed Weights   03/24/17 0844  Weight: 139 lb 4.8 oz (63.2 kg)    GENERAL:alert, no distress and comfortable SKIN: skin color, texture, turgor are normal, no rashes or significant lesions EYES: normal, conjunctiva are pink and non-injected, sclera clear OROPHARYNX:no exudate, no erythema and lips, buccal mucosa, and tongue normal  NECK: supple, thyroid normal size, non-tender, without nodularity LYMPH:  no palpable lymphadenopathy in the cervical, axillary or inguinal LUNGS: clear to auscultation and  percussion with normal breathing effort HEART: regular rate & rhythm and no murmurs and no lower extremity edema ABDOMEN:abdomen soft, non-tender and normal bowel sounds. (+) Ileostomy bag with red watery substance in the right abdomen, surgical incision sites has healed well. Musculoskeletal:no cyanosis of digits and no clubbing  PSYCH: alert &  oriented x 3 with fluent speech NEURO: no focal motor/sensory deficits  LABORATORY DATA:  I have reviewed the data as listed CBC Latest Ref Rng & Units 03/24/2017 03/10/2017 02/24/2017  WBC 4.0 - 10.3 10e3/uL 7.4 7.2 5.3  Hemoglobin 13.0 - 17.1 g/dL 14.6 14.5 14.5  Hematocrit 38.4 - 49.9 % 44.1 44.3 43.7  Platelets 140 - 400 10e3/uL 108(L) 120(L) 124(L)   CMP Latest Ref Rng & Units 03/24/2017 03/10/2017 02/24/2017  Glucose 70 - 140 mg/dl 93 101 124  BUN 7.0 - 26.0 mg/dL 11.0 11.1 8.4  Creatinine 0.7 - 1.3 mg/dL 0.8 0.9 0.9  Sodium 136 - 145 mEq/L 139 138 139  Potassium 3.5 - 5.1 mEq/L 4.4 4.4 3.9  Chloride 101 - 111 mmol/L - - -  CO2 22 - 29 mEq/L 21(L) 21(L) 23  Calcium 8.4 - 10.4 mg/dL 8.5 8.3(L) 8.6  Total Protein 6.4 - 8.3 g/dL 5.0(L) 4.8(L) 5.0(L)  Total Bilirubin 0.20 - 1.20 mg/dL 0.26 <0.22 0.22  Alkaline Phos 40 - 150 U/L 61 58 55  AST 5 - 34 U/L 33 26 26  ALT 0 - 55 U/L _0 Pathology report  Diagnosis 07/28/2016 1. Surgical [P], descending, polyps(2) -TUBULAR ADENOMAS. -NO HIGH GRADE DYSPLASIA OR MALIGNANCY IDENTIFIED. 2. Surgical [P], rectal mass -INVASIVE ADENOCARCINOMA. -SEE COMMENT. 3. Surgical [P], rectum, polyps(2) -TUBULAR ADENOMAS. -NO HIGH GRADE DYSPLASIA OR MALIGNANCY IDENTIFIED.    Diagnosis 12/01/2016 1. Colon, total resection (incl lymph nodes), proctocolectomy - SCATTERED SMALL RESIDUAL FOCI (EACH LESS THAN 0.3 CM) OF INVASIVE MODERATELY DIFFERENTIATED ADENOCARCINOMA EMBEDDED IN SUBMUCOSAL TISSUE WITH EXTENSIVE NEOADJUVANT EFFECT. - TUMOR IS PRESENT WITHIN A REGION MEASURING APPROXIMATELY 2.5 CM IN GREATEST  DIMENSION. - ADENOCARCINOMA INVADES THROUGH MUSCULARIS MUCOSA AND INVOLVES SUBSEROSAL SOFT TISSUE. - MARGINS ARE NEGATIVE. - TWO OF THIRTY-FIVE LYMPH NODES ARE POSITIVE FOR METASTATIC ADENOCARCINOMA (2/35). - LYMPH NODES ALSO DEMONSTRATE VARIABLE DEGREES OF NEOADJUVANT EFFECT. - TWO CALCIFIED AND NECROTIC SOFT TISSUE NODULES ARE ALSO PRESENT WITHOUT VIABLE TUMOR SEEN. - SEE ONCOLOGY TEMPLATE. 2. Colon, resection margin (donut), distal ring final distal margin - BENIGN ANAL MUCOSA. - NO DYSPLASIA OR TUMOR SEEN.  RADIOGRAPHIC STUDIES: I have personally reviewed the radiological images as listed and agreed with the findings in the report.  EUS 07/29/2016 -Clearly malignant 6cm long, nearly circumferential mass in the distal rectum with distal edge located 1cm from the anal verge. If this is rectal adenocarcinoma it is uT3N2a, Stage IIIa.  COLONOSCOPY 07/28/2016 - Rectal mass. - Four 4 mm polyps in the rectum and in the descending colon, removed with a cold snare. Resected and retrieved. - Diverticulosis in the left colon. - Likely malignant tumor in the distal rectum. Biopsied. Tattooed.  CT of the chest/abd/pelvis with contrast 11/29/2016 IMPRESSION: 1. Interval treatment changes within the perirectal fat with otherwise stable rectosigmoid colon wall thickening. 2. No progressive adenopathy or distant metastases identified. 3. Stable bilateral pulmonary nodules. Short-term stability (for 4 months) is reassuring, although the nodules are too small to optimally characterize by previous PET-CT. Continued CT follow-up recommended.   ASSESSMENT & PLAN: 46 y.o. male without significant past medical history presented with rectal pain and bleeding.  1. 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 He also has a slightly prominent periaortic lymph node, about 8 mm. -I previously reviewed his PET CT scan findings with patient and his girl firend, the small lung nodules and periaortic lymph node are not hypermetabolic, no definitive evidence of metastasis on the PET scan. -He previously completed neoadjuvant chemoradiation, tolerated moderate well overall.  -I previously reviewed his surgical pathology findings in great details. He had computed surgical resection, but unfortunately still has significant residual disease including 2 positive lymph nodes. -I previously recommended him to have adjuvant chemotherapy to reduce his risk of recurrence. Due to the positive lymph nodes, I recommend FOLFOX or CAPOX for 4.5 months  -The goal of therapy is curative. However his lung nodules are suspicious for metastatic disease, needs to be followed closely. If he does have lung metastasis, then his cancer is unlikely curable. -He did poorly with the first cycle CAPOX, chemo changed to FOLFOX from cycle 2 -He has high ileostomy output from his 3rd cycle of chemotherapy. We proceeded to cycle 4 with FOLFOX on 02/24/17 as scheduled, will skip his 5-FU bolus  -His diarrhea has improved, I will continue supportive care with IVF and nutrition support. I'll arrange IV fluids 1 L normal saline once a week  -His weight has been stable, mutation consult. -Lab reviewed, mild from cytopenia, adequate for treatment, we'll continue chemotherapy today -We'll repeat staging scan after done with chemo - Will see patient after every other treatment since he is doing well  2. Diarrhea  -He has watery stool after total colectomy, his ileostomy output has significant increases since he started chemotherapy -I encouraged him to continue using Imodium  and Lomotil.  -Hysteria has much improved with Lomotil, he will continue -I have previously set him up for IV fluids 2-3 times a week, will cut down once a week now  - Diarrhea has improved recently  3. 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 -He has undergone total colectomy and proctocolectomy, his risk of secondary colon cancer is minimal -We previously discussed other cancer risks from age syndrome, I recommend him to have EGD every 3-5 years, annual urinalysis for GU cancer screening.   4. Anxiety  -He previously felt nervous and anxious since his cancer diagnosis, much improved after starting treatment  -Continue Xanax as needed  5. Social issues -He is out of work, he wants to apply for Medicaid and disability  -He was seen by our Education officer, museum  6. Headache and pain at ileostomy bag -I previously encouraged him to use Tylenol for headaches.  -He has stated his pain is unbearable sometimes. He is taking oxycodone. 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 - Patient is no longer taking Oxycodone for pain  Plan  -Labs reviewed. The patient will have cycle 5 FOLFOX without 5-fu bolus today, he will received IVF 1L today. - schedule IV fluids in 1,3 and 5 weeks for 2 hrs - Lab, flush and chemo in 2,4,6 weeks -f/u in 4 weeks    All questions were answered. The patient knows to call the clinic with any problems, questions or concerns. I spent 20 minutes counseling the patient face to face. The total time spent in the appointment was 25 minutes and more than 50% was on counseling.  I have reviewed the above documentation for accuracy and completeness, and I agree with the above information.   This document serves as a record of services personally  performed by Truitt Merle, MD. It was created on her behalf by Brandt Loosen, a trained medical scribe. The creation of this record is based on the scribe's personal observations and the provider's statements to them. This document has been checked and approved by the attending provider.    Truitt Merle, MD 03/24/2017

## 2017-03-24 ENCOUNTER — Ambulatory Visit (HOSPITAL_BASED_OUTPATIENT_CLINIC_OR_DEPARTMENT_OTHER): Payer: Medicaid Other | Admitting: Hematology

## 2017-03-24 ENCOUNTER — Telehealth: Payer: Self-pay | Admitting: Hematology

## 2017-03-24 ENCOUNTER — Ambulatory Visit (HOSPITAL_BASED_OUTPATIENT_CLINIC_OR_DEPARTMENT_OTHER): Payer: Medicaid Other

## 2017-03-24 ENCOUNTER — Ambulatory Visit: Payer: Medicaid Other

## 2017-03-24 ENCOUNTER — Other Ambulatory Visit (HOSPITAL_BASED_OUTPATIENT_CLINIC_OR_DEPARTMENT_OTHER): Payer: Medicaid Other

## 2017-03-24 VITALS — BP 120/68 | HR 64

## 2017-03-24 VITALS — BP 94/70 | HR 74 | Temp 97.6°F | Resp 18 | Ht 71.0 in | Wt 139.3 lb

## 2017-03-24 DIAGNOSIS — R197 Diarrhea, unspecified: Secondary | ICD-10-CM | POA: Diagnosis not present

## 2017-03-24 DIAGNOSIS — R51 Headache: Secondary | ICD-10-CM

## 2017-03-24 DIAGNOSIS — R52 Pain, unspecified: Secondary | ICD-10-CM | POA: Diagnosis not present

## 2017-03-24 DIAGNOSIS — C20 Malignant neoplasm of rectum: Secondary | ICD-10-CM

## 2017-03-24 DIAGNOSIS — Z1509 Genetic susceptibility to other malignant neoplasm: Secondary | ICD-10-CM | POA: Diagnosis not present

## 2017-03-24 DIAGNOSIS — R918 Other nonspecific abnormal finding of lung field: Secondary | ICD-10-CM | POA: Diagnosis not present

## 2017-03-24 DIAGNOSIS — Z5111 Encounter for antineoplastic chemotherapy: Secondary | ICD-10-CM | POA: Diagnosis not present

## 2017-03-24 DIAGNOSIS — F418 Other specified anxiety disorders: Secondary | ICD-10-CM | POA: Diagnosis not present

## 2017-03-24 LAB — COMPREHENSIVE METABOLIC PANEL
ALBUMIN: 2.4 g/dL — AB (ref 3.5–5.0)
ALK PHOS: 61 U/L (ref 40–150)
ALT: 17 U/L (ref 0–55)
ANION GAP: 6 meq/L (ref 3–11)
AST: 33 U/L (ref 5–34)
BILIRUBIN TOTAL: 0.26 mg/dL (ref 0.20–1.20)
BUN: 11 mg/dL (ref 7.0–26.0)
CO2: 21 meq/L — AB (ref 22–29)
Calcium: 8.5 mg/dL (ref 8.4–10.4)
Chloride: 112 mEq/L — ABNORMAL HIGH (ref 98–109)
Creatinine: 0.8 mg/dL (ref 0.7–1.3)
Glucose: 93 mg/dl (ref 70–140)
Potassium: 4.4 mEq/L (ref 3.5–5.1)
Sodium: 139 mEq/L (ref 136–145)
TOTAL PROTEIN: 5 g/dL — AB (ref 6.4–8.3)

## 2017-03-24 LAB — CBC WITH DIFFERENTIAL/PLATELET
BASO%: 0.5 % (ref 0.0–2.0)
BASOS ABS: 0 10*3/uL (ref 0.0–0.1)
EOS ABS: 0.1 10*3/uL (ref 0.0–0.5)
EOS%: 1.9 % (ref 0.0–7.0)
HCT: 44.1 % (ref 38.4–49.9)
HGB: 14.6 g/dL (ref 13.0–17.1)
LYMPH%: 7.4 % — AB (ref 14.0–49.0)
MCH: 27.7 pg (ref 27.2–33.4)
MCHC: 33.2 g/dL (ref 32.0–36.0)
MCV: 83.4 fL (ref 79.3–98.0)
MONO#: 0.7 10*3/uL (ref 0.1–0.9)
MONO%: 9.5 % (ref 0.0–14.0)
NEUT%: 80.7 % — AB (ref 39.0–75.0)
NEUTROS ABS: 6 10*3/uL (ref 1.5–6.5)
PLATELETS: 108 10*3/uL — AB (ref 140–400)
RBC: 5.29 10*6/uL (ref 4.20–5.82)
RDW: 18 % — ABNORMAL HIGH (ref 11.0–14.6)
WBC: 7.4 10*3/uL (ref 4.0–10.3)
lymph#: 0.5 10*3/uL — ABNORMAL LOW (ref 0.9–3.3)

## 2017-03-24 LAB — TECHNOLOGIST REVIEW

## 2017-03-24 LAB — CEA (IN HOUSE-CHCC): CEA (CHCC-In House): 1.74 ng/mL (ref 0.00–5.00)

## 2017-03-24 MED ORDER — SODIUM CHLORIDE 0.9% FLUSH
10.0000 mL | INTRAVENOUS | Status: DC | PRN
  Filled 2017-03-24: qty 10

## 2017-03-24 MED ORDER — LEUCOVORIN CALCIUM INJECTION 350 MG
400.0000 mg/m2 | Freq: Once | INTRAVENOUS | Status: AC
  Administered 2017-03-24: 728 mg via INTRAVENOUS
  Filled 2017-03-24: qty 36.4

## 2017-03-24 MED ORDER — SODIUM CHLORIDE 0.9 % IV SOLN
1000.0000 mL | Freq: Once | INTRAVENOUS | Status: AC
  Administered 2017-03-24: 1000 mL via INTRAVENOUS

## 2017-03-24 MED ORDER — FLUOROURACIL CHEMO INJECTION 2.5 GM/50ML: 400.0000 mg/m2 | Freq: Once | INTRAVENOUS | Status: DC

## 2017-03-24 MED ORDER — SODIUM CHLORIDE 0.9 % IV SOLN
2400.0000 mg/m2 | INTRAVENOUS | Status: DC
  Administered 2017-03-24: 4350 mg via INTRAVENOUS
  Filled 2017-03-24: qty 87

## 2017-03-24 MED ORDER — SODIUM CHLORIDE 0.9% FLUSH
10.0000 mL | INTRAVENOUS | Status: DC | PRN
  Administered 2017-03-24: 10 mL
  Filled 2017-03-24: qty 10

## 2017-03-24 MED ORDER — HEPARIN SOD (PORK) LOCK FLUSH 100 UNIT/ML IV SOLN
500.0000 [IU] | Freq: Once | INTRAVENOUS | Status: DC | PRN
  Filled 2017-03-24: qty 5

## 2017-03-24 MED ORDER — PALONOSETRON HCL INJECTION 0.25 MG/5ML
INTRAVENOUS | Status: AC
  Filled 2017-03-24: qty 5

## 2017-03-24 MED ORDER — OXALIPLATIN CHEMO INJECTION 100 MG/20ML
82.0000 mg/m2 | Freq: Once | INTRAVENOUS | Status: AC
  Administered 2017-03-24: 150 mg via INTRAVENOUS
  Filled 2017-03-24: qty 20

## 2017-03-24 MED ORDER — DEXAMETHASONE SODIUM PHOSPHATE 10 MG/ML IJ SOLN
10.0000 mg | Freq: Once | INTRAMUSCULAR | Status: AC
  Administered 2017-03-24: 10 mg via INTRAVENOUS

## 2017-03-24 MED ORDER — DEXTROSE 5 % IV SOLN
Freq: Once | INTRAVENOUS | Status: AC
  Administered 2017-03-24: 10:00:00 via INTRAVENOUS

## 2017-03-24 MED ORDER — PALONOSETRON HCL INJECTION 0.25 MG/5ML
0.2500 mg | Freq: Once | INTRAVENOUS | Status: AC
  Administered 2017-03-24: 0.25 mg via INTRAVENOUS

## 2017-03-24 MED ORDER — DEXAMETHASONE SODIUM PHOSPHATE 10 MG/ML IJ SOLN
INTRAMUSCULAR | Status: AC
  Filled 2017-03-24: qty 1

## 2017-03-24 NOTE — Telephone Encounter (Signed)
Appointments scheduled per 4.5.18 LOS. Patient given AVS report and calendars with future scheduled appointments. °

## 2017-03-24 NOTE — Patient Instructions (Signed)
[name] Discharge Instructions for Patients Receiving Chemotherapy  Today you received the following chemotherapy agents: Oxaliplatin, Leucovorin and Adrucil.   To help prevent nausea and vomiting after your treatment, we encourage you to take your nausea medication as directed.  If you develop nausea and vomiting that is not controlled by your nausea medication, call the clinic.   BELOW ARE SYMPTOMS THAT SHOULD BE REPORTED IMMEDIATELY:  *FEVER GREATER THAN 100.5 F  *CHILLS WITH OR WITHOUT FEVER  NAUSEA AND VOMITING THAT IS NOT CONTROLLED WITH YOUR NAUSEA MEDICATION  *UNUSUAL SHORTNESS OF BREATH  *UNUSUAL BRUISING OR BLEEDING  TENDERNESS IN MOUTH AND THROAT WITH OR WITHOUT PRESENCE OF ULCERS  *URINARY PROBLEMS  *BOWEL PROBLEMS  UNUSUAL RASH Items with * indicate a potential emergency and should be followed up as soon as possible.  Feel free to call the clinic you have any questions or concerns. The clinic phone number is [phone].  Please show the CHEMO ALERT CARD at check-in to the Emergency Department and triage nurse.   

## 2017-03-25 ENCOUNTER — Encounter: Payer: Self-pay | Admitting: Hematology

## 2017-03-25 NOTE — Addendum Note (Signed)
Addended by: Truitt Merle on: 03/25/2017 08:41 AM   Modules accepted: Orders

## 2017-03-26 ENCOUNTER — Ambulatory Visit (HOSPITAL_BASED_OUTPATIENT_CLINIC_OR_DEPARTMENT_OTHER): Payer: Medicaid Other

## 2017-03-26 VITALS — BP 111/82 | HR 72 | Temp 97.8°F | Resp 16

## 2017-03-26 DIAGNOSIS — C20 Malignant neoplasm of rectum: Secondary | ICD-10-CM

## 2017-03-26 DIAGNOSIS — Z452 Encounter for adjustment and management of vascular access device: Secondary | ICD-10-CM | POA: Diagnosis not present

## 2017-03-26 MED ORDER — SODIUM CHLORIDE 0.9% FLUSH
10.0000 mL | INTRAVENOUS | Status: DC | PRN
  Administered 2017-03-26: 10 mL
  Filled 2017-03-26: qty 10

## 2017-03-26 MED ORDER — HEPARIN SOD (PORK) LOCK FLUSH 100 UNIT/ML IV SOLN
500.0000 [IU] | Freq: Once | INTRAVENOUS | Status: AC | PRN
  Administered 2017-03-26: 500 [IU]
  Filled 2017-03-26: qty 5

## 2017-03-26 NOTE — Patient Instructions (Signed)
Implanted Port Home Guide An implanted port is a type of central line that is placed under the skin. Central lines are used to provide IV access when treatment or nutrition needs to be given through a person's veins. Implanted ports are used for long-term IV access. An implanted port may be placed because:  You need IV medicine that would be irritating to the small veins in your hands or arms.  You need long-term IV medicines, such as antibiotics.  You need IV nutrition for a long period.  You need frequent blood draws for lab tests.  You need dialysis.  Implanted ports are usually placed in the chest area, but they can also be placed in the upper arm, the abdomen, or the leg. An implanted port has two main parts:  Reservoir. The reservoir is round and will appear as a small, raised area under your skin. The reservoir is the part where a needle is inserted to give medicines or draw blood.  Catheter. The catheter is a thin, flexible tube that extends from the reservoir. The catheter is placed into a large vein. Medicine that is inserted into the reservoir goes into the catheter and then into the vein.  How will I care for my incision site? Do not get the incision site wet. Bathe or shower as directed by your health care provider. How is my port accessed? Special steps must be taken to access the port:  Before the port is accessed, a numbing cream can be placed on the skin. This helps numb the skin over the port site.  Your health care provider uses a sterile technique to access the port. ? Your health care provider must put on a mask and sterile gloves. ? The skin over your port is cleaned carefully with an antiseptic and allowed to dry. ? The port is gently pinched between sterile gloves, and a needle is inserted into the port.  Only "non-coring" port needles should be used to access the port. Once the port is accessed, a blood return should be checked. This helps ensure that the port  is in the vein and is not clogged.  If your port needs to remain accessed for a constant infusion, a clear (transparent) bandage will be placed over the needle site. The bandage and needle will need to be changed every week, or as directed by your health care provider.  Keep the bandage covering the needle clean and dry. Do not get it wet. Follow your health care provider's instructions on how to take a shower or bath while the port is accessed.  If your port does not need to stay accessed, no bandage is needed over the port.  What is flushing? Flushing helps keep the port from getting clogged. Follow your health care provider's instructions on how and when to flush the port. Ports are usually flushed with saline solution or a medicine called heparin. The need for flushing will depend on how the port is used.  If the port is used for intermittent medicines or blood draws, the port will need to be flushed: ? After medicines have been given. ? After blood has been drawn. ? As part of routine maintenance.  If a constant infusion is running, the port may not need to be flushed.  How long will my port stay implanted? The port can stay in for as long as your health care provider thinks it is needed. When it is time for the port to come out, surgery will be   done to remove it. The procedure is similar to the one performed when the port was put in. When should I seek immediate medical care? When you have an implanted port, you should seek immediate medical care if:  You notice a bad smell coming from the incision site.  You have swelling, redness, or drainage at the incision site.  You have more swelling or pain at the port site or the surrounding area.  You have a fever that is not controlled with medicine.  This information is not intended to replace advice given to you by your health care provider. Make sure you discuss any questions you have with your health care provider. Document  Released: 12/06/2005 Document Revised: 05/13/2016 Document Reviewed: 08/13/2013 Elsevier Interactive Patient Education  2017 Elsevier Inc.  

## 2017-03-30 ENCOUNTER — Telehealth: Payer: Self-pay | Admitting: *Deleted

## 2017-03-30 NOTE — Telephone Encounter (Signed)
Received call from [facility] in scheduling stating pt is refusing to go to [facility] for IVFs in AM;  I spoke to wife, he stated he would feel more comfortable coming to our unit. pt is agreeable to come here at 730am tomorrow for IVFs; both appreciated [place] being able to accommodate him.

## 2017-03-31 ENCOUNTER — Ambulatory Visit: Payer: Medicaid Other

## 2017-03-31 ENCOUNTER — Encounter (HOSPITAL_COMMUNITY): Payer: Medicaid Other

## 2017-03-31 ENCOUNTER — Ambulatory Visit (HOSPITAL_BASED_OUTPATIENT_CLINIC_OR_DEPARTMENT_OTHER): Payer: Medicaid Other

## 2017-03-31 VITALS — BP 95/81 | HR 64 | Temp 97.0°F | Resp 18

## 2017-03-31 DIAGNOSIS — C20 Malignant neoplasm of rectum: Secondary | ICD-10-CM

## 2017-03-31 DIAGNOSIS — R197 Diarrhea, unspecified: Secondary | ICD-10-CM

## 2017-03-31 MED ORDER — SODIUM CHLORIDE 0.9% FLUSH
10.0000 mL | INTRAVENOUS | Status: DC | PRN
  Administered 2017-03-31: 10 mL
  Filled 2017-03-31: qty 10

## 2017-03-31 MED ORDER — HEPARIN SOD (PORK) LOCK FLUSH 100 UNIT/ML IV SOLN
500.0000 [IU] | Freq: Once | INTRAVENOUS | Status: AC | PRN
  Administered 2017-03-31: 500 [IU]
  Filled 2017-03-31: qty 5

## 2017-03-31 MED ORDER — SODIUM CHLORIDE 0.9 % IV SOLN
1000.0000 mL | Freq: Once | INTRAVENOUS | Status: AC
Start: 2017-03-31 — End: 2017-03-31
  Administered 2017-03-31: 1000 mL via INTRAVENOUS

## 2017-03-31 NOTE — Patient Instructions (Signed)
Dehydration, Adult Dehydration is a condition in which there is not enough fluid or water in the body. This happens when you lose more fluids than you take in. Important organs, such as the kidneys, brain, and heart, cannot function without a proper amount of fluids. Any loss of fluids from the body can lead to dehydration. Dehydration can range from mild to severe. This condition should be treated right away to prevent it from becoming severe. What are the causes? This condition may be caused by:  Vomiting.  Diarrhea.  Excessive sweating, such as from heat exposure or exercise.  Not drinking enough fluid, especially:  When ill.  While doing activity that requires a lot of energy.  Excessive urination.  Fever.  Infection.  Certain medicines, such as medicines that cause the body to lose excess fluid (diuretics).  Inability to access safe drinking water.  Reduced physical ability to get adequate water and food. What increases the risk? This condition is more likely to develop in people:  Who have a poorly controlled long-term (chronic) illness, such as diabetes, heart disease, or kidney disease.  Who are age 65 or older.  Who are disabled.  Who live in a place with high altitude.  Who play endurance sports. What are the signs or symptoms? Symptoms of mild dehydration may include:   Thirst.  Dry lips.  Slightly dry mouth.  Dry, warm skin.  Dizziness. Symptoms of moderate dehydration may include:   Very dry mouth.  Muscle cramps.  Dark urine. Urine may be the color of tea.  Decreased urine production.  Decreased tear production.  Heartbeat that is irregular or faster than normal (palpitations).  Headache.  Light-headedness, especially when you stand up from a sitting position.  Fainting (syncope). Symptoms of severe dehydration may include:   Changes in skin, such as:  Cold and clammy skin.  Blotchy (mottled) or pale skin.  Skin that does  not quickly return to normal after being lightly pinched and released (poor skin turgor).  Changes in body fluids, such as:  Extreme thirst.  No tear production.  Inability to sweat when body temperature is high, such as in hot weather.  Very little urine production.  Changes in vital signs, such as:  Weak pulse.  Pulse that is more than 100 beats a minute when sitting still.  Rapid breathing.  Low blood pressure.  Other changes, such as:  Sunken eyes.  Cold hands and feet.  Confusion.  Lack of energy (lethargy).  Difficulty waking up from sleep.  Short-term weight loss.  Unconsciousness. How is this diagnosed? This condition is diagnosed based on your symptoms and a physical exam. Blood and urine tests may be done to help confirm the diagnosis. How is this treated? Treatment for this condition depends on the severity. Mild or moderate dehydration can often be treated at home. Treatment should be started right away. Do not wait until dehydration becomes severe. Severe dehydration is an emergency and it needs to be treated in a hospital. Treatment for mild dehydration may include:   Drinking more fluids.  Replacing salts and minerals in your blood (electrolytes) that you may have lost. Treatment for moderate dehydration may include:   Drinking an oral rehydration solution (ORS). This is a drink that helps you replace fluids and electrolytes (rehydrate). It can be found at pharmacies and retail stores. Treatment for severe dehydration may include:   Receiving fluids through an IV tube.  Receiving an electrolyte solution through a feeding tube that is   passed through your nose and into your stomach (nasogastric tube, or NG tube).  Correcting any abnormalities in electrolytes.  Treating the underlying cause of dehydration. Follow these instructions at home:  If directed by your health care provider, drink an ORS:  Make an ORS by following instructions on the  package.  Start by drinking small amounts, about  cup (120 mL) every 5-10 minutes.  Slowly increase how much you drink until you have taken the amount recommended by your health care provider.  Drink enough clear fluid to keep your urine clear or pale yellow. If you were told to drink an ORS, finish the ORS first, then start slowly drinking other clear fluids. Drink fluids such as:  Water. Do not drink only water. Doing that can lead to having too little salt (sodium) in the body (hyponatremia).  Ice chips.  Fruit juice that you have added water to (diluted fruit juice).  Low-calorie sports drinks.  Avoid:  Alcohol.  Drinks that contain a lot of sugar. These include high-calorie sports drinks, fruit juice that is not diluted, and soda.  Caffeine.  Foods that are greasy or contain a lot of fat or sugar.  Take over-the-counter and prescription medicines only as told by your health care provider.  Do not take sodium tablets. This can lead to having too much sodium in the body (hypernatremia).  Eat foods that contain a healthy balance of electrolytes, such as bananas, oranges, potatoes, tomatoes, and spinach.  Keep all follow-up visits as told by your health care provider. This is important. Contact a health care provider if:  You have abdominal pain that:  Gets worse.  Stays in one area (localizes).  You have a rash.  You have a stiff neck.  You are more irritable than usual.  You are sleepier or more difficult to wake up than usual.  You feel weak or dizzy.  You feel very thirsty.  You have urinated only a small amount of very dark urine over 6-8 hours. Get help right away if:  You have symptoms of severe dehydration.  You cannot drink fluids without vomiting.  Your symptoms get worse with treatment.  You have a fever.  You have a severe headache.  You have vomiting or diarrhea that:  Gets worse.  Does not go away.  You have blood or green matter  (bile) in your vomit.  You have blood in your stool. This may cause stool to look black and tarry.  You have not urinated in 6-8 hours.  You faint.  Your heart rate while sitting still is over 100 beats a minute.  You have trouble breathing. This information is not intended to replace advice given to you by your health care provider. Make sure you discuss any questions you have with your health care provider. Document Released: 12/06/2005 Document Revised: 07/02/2016 Document Reviewed: 01/30/2016 Elsevier Interactive Patient Education  2017 [employer]  

## 2017-04-04 ENCOUNTER — Encounter (HOSPITAL_COMMUNITY): Payer: Self-pay | Admitting: Emergency Medicine

## 2017-04-04 ENCOUNTER — Emergency Department (HOSPITAL_COMMUNITY)
Admission: EM | Admit: 2017-04-04 | Discharge: 2017-04-04 | Disposition: A | Discharge status: Home / Self Care | Payer: Medicaid Other | Attending: Emergency Medicine | Admitting: Emergency Medicine

## 2017-04-04 ENCOUNTER — Emergency Department (HOSPITAL_COMMUNITY): Payer: Medicaid Other

## 2017-04-04 DIAGNOSIS — Z79899 Other long term (current) drug therapy: Secondary | ICD-10-CM | POA: Insufficient documentation

## 2017-04-04 DIAGNOSIS — Z85038 Personal history of other malignant neoplasm of large intestine: Secondary | ICD-10-CM | POA: Diagnosis not present

## 2017-04-04 DIAGNOSIS — M71012 Abscess of bursa, left shoulder: Secondary | ICD-10-CM | POA: Insufficient documentation

## 2017-04-04 DIAGNOSIS — M25512 Pain in left shoulder: Secondary | ICD-10-CM | POA: Diagnosis present

## 2017-04-04 DIAGNOSIS — Z87891 Personal history of nicotine dependence: Secondary | ICD-10-CM | POA: Insufficient documentation

## 2017-04-04 NOTE — ED Notes (Signed)
Bed: WA20 Expected date:  Expected time:  Means of arrival:  Comments: triage 

## 2017-04-04 NOTE — ED Triage Notes (Signed)
Pt reports L neck/shoulder/arm pain for the past few days. Had this problem a year ago, but it has returned. Pt being treated for colon cancer. Last chemo 1.5 weeks ago. Pain worse with movement, and pt hears grinding noise.

## 2017-04-04 NOTE — ED Provider Notes (Signed)
[facility] DEPT Provider Note   CSN: [id number] Arrival date & time: 04/04/17  1450     History   Chief Complaint Chief Complaint  Patient presents with  . Shoulder Pain  . Arm Pain    HPI [name] is a 46 y.o. [place] male who presents emergency Department with chief complaint of left shoulder pain. Patient is currently under cancer treatment for colorectal cancer and is status post colectomy with colostomy placement. Patient states that 3 days ago he woke up with left-sided neck pain and had difficulty turning his neck. Patient states that it moved into his left shoulder region and he now has intermittent pain that is aching and radiates down the arm, but did not cause any paresthesia, weakness or numbness. He states he had something similar to this a couple years ago and that improved on its own. He has been taking Tylenol with moderate (symptoms. Given his diagnosis of cancer. He and his wife wanted him to be evaluated because he states he waited 6 months before he left for had his colorectal cancer evaluated. He denies fever, chills, headache, neck stiffness, difficulty moving the left shoulder.  HPI  Past Medical History:  Diagnosis Date  . Cancer ([place]) 12/01/2016   colon   . Family history of colon cancer   . Poor dental hygiene     Patient Active Problem List   Diagnosis Date Noted  . Diarrhea 02/17/2017  . 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 Hypomagnesemia 12/02/2016  . 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 10/13/2016  . Family history of colon cancer   . Anxiety 09/14/2016  . Rectal adenocarcinoma s/p protctocolectomy, IPAA "J" pouch 12/01/2016 08/06/2016    Past Surgical History:  Procedure Laterality Date  . EUS N/A 07/29/2016   Procedure: LOWER ENDOSCOPIC ULTRASOUND (EUS);  Surgeon: Milus Banister, MD;  Location: [facility];  Service: Endoscopy;  Laterality: N/A;    . PORTACATH PLACEMENT N/A 01/26/2017   Procedure: PLACEMENT OF PORT-A-CATH CENTRAL LINE WITH FLUOROSCOPY AND ULTRASOUND;  Surgeon: Michael Boston, MD;  Location: [facility];  Service: General;  Laterality: N/A;  . PROCTOSCOPY N/A 12/01/2016   Procedure: RIGID PROCTOSCOPY;  Surgeon: Michael Boston, MD;  Location: [facility];  Service: General;  Laterality: N/A;  . TOE AMPUTATION Left   . XI ROBOTIC ASSISTED LOWER ANTERIOR RESECTION N/A 12/01/2016   Procedure: XI ROBOTIC ASSISTED PROCTOCOLECTOMY WITH ILLEOPOUCH ANASTAMOSIS WITH DIVERTING ILLEOSTOMY;  Surgeon: Michael Boston, MD;  Location: [facility];  Service: General;  Laterality: N/A;       Home Medications    Prior to Admission medications   Medication Sig Start Date End Date Taking? Authorizing Provider  ALPRAZolam Duanne Moron) 0.5 MG tablet Take 0.5 tablets (0.25 mg total) by mouth at bedtime as needed for anxiety. For anxiety 02/17/17  Yes Truitt Merle, MD  diphenoxylate-atropine (LOMOTIL) 2.5-0.025 MG tablet Take 1-2 tablets by mouth 4 (four) times daily as needed for diarrhea or loose stools. 01/11/17  Yes Truitt Merle, MD  naproxen sodium (ALEVE) 220 MG tablet Take 440 mg by mouth 2 (two) times daily as needed (for pain.).   Yes Historical Provider, MD  oxyCODONE (OXY IR/ROXICODONE) 5 MG immediate release tablet TAKE 1 TABLET BY MOUTH EVERY 6 HOURS AS NEEDED FOR SEVERE PAIN 02/17/17  Yes Historical Provider, MD  polyethylene glycol (MIRALAX / GLYCOLAX) packet Take 17 g by mouth daily as needed for moderate constipation.  Historical Provider, MD  prochlorperazine (COMPAZINE) 10 MG tablet Take 1 tablet (10 mg total) by mouth every 6 (six) hours as needed for nausea or vomiting. Patient not taking: Reported on 04/04/2017 08/16/16   Truitt Merle, MD  traMADol (ULTRAM) 50 MG tablet Take 1-2 tablets (50-100 mg total) by mouth every 6 (six) hours as needed for moderate pain or severe pain (for pain.). Patient not taking: Reported on 04/04/2017 12/01/16   Michael Boston, MD    Family History Family History  Problem Relation Age of Onset  . Diabetes Mother   . COPD Father   . Colon cancer Maternal Aunt     dx in her 43s  . Diabetes Maternal Grandmother   . Brain cancer Maternal Grandfather   . Lung cancer Paternal Grandfather   . Colon cancer Cousin     dx in her 60s  . Bone cancer Sister 8  . Pancreatic cancer Neg Hx   . Esophageal cancer Neg Hx   . Stomach cancer Neg Hx   . Liver disease Neg Hx     Social History Social History  Substance Use Topics  . Smoking status: Former Smoker    Packs/day: 1.50    Years: 30.00    Types: Cigarettes    Quit date: 12/01/2016  . Smokeless tobacco: Never Used  . Alcohol use No     Allergies   Patient has no known allergies.   Review of Systems Review of Systems  Ten systems reviewed and are negative for acute change, except as noted in the HPI.   Physical Exam Updated Vital Signs BP (!) 148/94 (BP Location: Left Arm)   Pulse 62   Temp 97.7 F (36.5 C) (Oral)   Resp 14   Ht _0  (1.803 m)   Wt 65.8 kg   SpO2 99%   BMI 20.22 kg/m   Physical Exam  Constitutional: He appears well-developed and well-nourished. No distress.  Protein and malnourished  HENT:  Head: Normocephalic and atraumatic.  Eyes: Conjunctivae and EOM are normal. Pupils are equal, round, and reactive to light. No scleral icterus.  Neck: Normal range of motion. Neck supple.    Trigger points in the left trapezius and neck that reproduces the patient's pain. Full range of motion of the left shoulder, normal strength, sensation and pulses bilaterally in the upper extremities.  Cardiovascular: Normal rate, regular rhythm and normal heart sounds.   Pulmonary/Chest: Effort normal and breath sounds normal. No respiratory distress.  Abdominal: Soft. There is no tenderness.  Musculoskeletal: He exhibits no edema.  Neurological: He is alert.  Skin: Skin is warm and dry. He is not diaphoretic.  Psychiatric: His  behavior is normal.  Nursing note and vitals reviewed.    ED Treatments / Results  Labs (all labs ordered are listed, but only abnormal results are displayed) Labs Reviewed - No data to display  EKG  EKG Interpretation None       Radiology Dg Shoulder Left  Result Date: 04/04/2017 CLINICAL DATA:  Pt reports L neck/shoulder/arm pain for the past few days. Had this problem a year ago, but it has returned. Pt being treated for colon cancer. Last chemo 1.5 weeks ago. Pain worse with movement, and pt hears grinding noise. EXAM: LEFT SHOULDER - 2+ VIEW COMPARISON:  None. FINDINGS: There is no evidence of fracture or dislocation. No acute or suspicious osseous lesion. There is no evidence of arthropathy or other focal bone abnormality. Soft tissues are unremarkable. IMPRESSION: Negative. Electronically  Signed   By: Franki Cabot M.D.   On: 04/04/2017 15:44    Procedures Procedures (including critical care time)  Medications Ordered in ED Medications - No data to display   Initial Impression / Assessment and Plan / ED Course  I have reviewed the triage vital signs and the nursing notes.  Pertinent labs & imaging results that were available during my care of the patient were reviewed by me and considered in my medical decision making (see chart for details).     Patient with some reproducible left shoulder region muscular pain. Full range of motion and strength of the left shoulder joint. No neurologic abnormalities. Patient states he does not want narcotics as it made him have akathisia and paradoxical hyperactivity. He has been using Tylenol with relief and feels that this is sufficient. He appears safe for discharge at this time. I discussed return precautions. Patient may follow-up with his primary care physician  Final Clinical Impressions(s) / ED Diagnoses   Final diagnoses:  Abscess of bursa of left shoulder region    New Prescriptions New Prescriptions   No medications  on file     Margarita Mail, PA-C 04/04/17 1817    Drenda Freeze, MD 04/04/17 2348

## 2017-04-04 NOTE — ED Notes (Signed)
Patient left before receiving d/c paperwork and signing. Alert and oriented, ambulatory without difficulty in NAD.

## 2017-04-04 NOTE — ED Notes (Signed)
ED Provider at bedside. 

## 2017-04-07 ENCOUNTER — Ambulatory Visit: Payer: Medicaid Other

## 2017-04-07 ENCOUNTER — Other Ambulatory Visit: Payer: Medicaid Other

## 2017-04-07 ENCOUNTER — Encounter: Payer: Medicaid Other | Admitting: Nutrition

## 2017-04-11 ENCOUNTER — Ambulatory Visit (HOSPITAL_BASED_OUTPATIENT_CLINIC_OR_DEPARTMENT_OTHER): Payer: Medicaid Other | Admitting: Oncology

## 2017-04-11 ENCOUNTER — Other Ambulatory Visit: Payer: Self-pay | Admitting: *Deleted

## 2017-04-11 ENCOUNTER — Encounter: Payer: Self-pay | Admitting: Oncology

## 2017-04-11 ENCOUNTER — Telehealth: Payer: Self-pay | Admitting: *Deleted

## 2017-04-11 ENCOUNTER — Ambulatory Visit (HOSPITAL_BASED_OUTPATIENT_CLINIC_OR_DEPARTMENT_OTHER): Payer: Medicaid Other

## 2017-04-11 DIAGNOSIS — M62838 Other muscle spasm: Secondary | ICD-10-CM | POA: Insufficient documentation

## 2017-04-11 DIAGNOSIS — C20 Malignant neoplasm of rectum: Secondary | ICD-10-CM

## 2017-04-11 DIAGNOSIS — Z5111 Encounter for antineoplastic chemotherapy: Secondary | ICD-10-CM

## 2017-04-11 DIAGNOSIS — M79601 Pain in right arm: Secondary | ICD-10-CM

## 2017-04-11 LAB — CBC WITH DIFFERENTIAL/PLATELET
BASO%: 1.5 % (ref 0.0–2.0)
BASOS ABS: 0.1 10*3/uL (ref 0.0–0.1)
EOS ABS: 0.1 10*3/uL (ref 0.0–0.5)
EOS%: 2.2 % (ref 0.0–7.0)
HEMATOCRIT: 44.8 % (ref 38.4–49.9)
HEMOGLOBIN: 14.5 g/dL (ref 13.0–17.1)
LYMPH#: 0.8 10*3/uL — AB (ref 0.9–3.3)
LYMPH%: 14 % (ref 14.0–49.0)
MCH: 27.1 pg — AB (ref 27.2–33.4)
MCHC: 32.4 g/dL (ref 32.0–36.0)
MCV: 83.7 fL (ref 79.3–98.0)
MONO#: 0.8 10*3/uL (ref 0.1–0.9)
MONO%: 13.7 % (ref 0.0–14.0)
NEUT#: 3.7 10*3/uL (ref 1.5–6.5)
NEUT%: 68.6 % (ref 39.0–75.0)
PLATELETS: 182 10*3/uL (ref 140–400)
RBC: 5.35 10*6/uL (ref 4.20–5.82)
RDW: 20.3 % — AB (ref 11.0–14.6)
WBC: 5.5 10*3/uL (ref 4.0–10.3)

## 2017-04-11 LAB — COMPREHENSIVE METABOLIC PANEL
ALBUMIN: 2.5 g/dL — AB (ref 3.5–5.0)
ALK PHOS: 59 U/L (ref 40–150)
ALT: 18 U/L (ref 0–55)
ANION GAP: 8 meq/L (ref 3–11)
AST: 31 U/L (ref 5–34)
BUN: 8.9 mg/dL (ref 7.0–26.0)
CALCIUM: 8.3 mg/dL — AB (ref 8.4–10.4)
CO2: 21 mEq/L — ABNORMAL LOW (ref 22–29)
Chloride: 111 mEq/L — ABNORMAL HIGH (ref 98–109)
Creatinine: 0.9 mg/dL (ref 0.7–1.3)
Glucose: 108 mg/dl (ref 70–140)
Potassium: 4.3 mEq/L (ref 3.5–5.1)
Sodium: 140 mEq/L (ref 136–145)
Total Bilirubin: <0.22 mg/dL (ref 0.20–1.20)
Total Protein: 4.9 g/dL — ABNORMAL LOW (ref 6.4–8.3)

## 2017-04-11 LAB — TECHNOLOGIST REVIEW

## 2017-04-11 MED ORDER — OXYCODONE HCL 5 MG PO TABS: ORAL_TABLET | ORAL | 0 refills | Status: DC

## 2017-04-11 MED ORDER — DIPHENOXYLATE-ATROPINE 2.5-0.025 MG PO TABS: 1.0000 | ORAL_TABLET | Freq: Four times a day (QID) | ORAL | 1 refills | Status: DC | PRN

## 2017-04-11 MED ORDER — METHOCARBAMOL 500 MG PO TABS: 500.0000 mg | ORAL_TABLET | Freq: Four times a day (QID) | ORAL | 0 refills | Status: DC | PRN

## 2017-04-11 NOTE — Patient Instructions (Signed)
Do exercises given to you on handout (see separate handout).  You can use Oxycodone 1 tab every 6 hours as needed for pain short-term. I have sent a prescription for a muscle relaxer that you can use short-term every 6 hours as needed.  If pain does not improve, will consider a referral to Orthopedics.

## 2017-04-11 NOTE — Progress Notes (Signed)
SYMPTOM MANAGEMENT CLINIC    Chief Complaint: Left arm pain  HPI:  [name] 46 y.o. male diagnosed with rectal adenocarcinoma currently receiving adjuvant chemotherapy with FOLFOX. The patient no showed for his last chemotherapy appointment last week. The patient's wife called our office earlier today reporting that he was having left arm pain. He was seen in the emergency room last week for this issue. The pain was not improving and he was scheduled be seeing symptom management clinic today. The patient reports that his left arm has been hurting for about 5-6 days. Review of the emergency room visit on 04/04/2017 show that the patient had no change in range of motion to the left arm and x-ray of the left shoulder was negative. The patient was diagnosed with bursitis. The patient today states the pain got better over the weekend, but is now worse. The pain starts in the left scapula and radiates down the arm into the first and middle fingers. Reports pain is sharp. He has been taking Tylenol on occasion and most recently has been taking Aleve 2 tablets every 2-3 hours. He realizes he is taking too much Aleve. He denies any injury to this area and no repetitive motion.  He is otherwise tolerating his chemotherapy well. Denies fevers chills. Denies chest pain, shortness of breath, nausea, and vomiting. Reports ongoing diarrhea and uses Lomotil. He requests a refill of his Lomotil today. He reports neuropathy in medially following his chemotherapy but that has now resolved. States that he missed his chemotherapy last week due to arm pain.   Oncology History   Rectal adenocarcinoma [facility])   Staging form: Colon and Rectum, AJCC 7th Edition   - Clinical stage from 07/28/2016: Stage IIIB (T3, N2a, M0) - Signed by Truitt Merle, MD on 08/06/2016      Rectal adenocarcinoma s/p protctocolectomy, IPAA "J" pouch 12/01/2016   07/26/2016 Imaging    CT chest, abdomen and pelvis with contrast showed nodular  thickening of the rectal wall, enlarged perirectal lymph nodes. Diverticulosis. Nonspecific low-level lymphadenopathy. Lower lobe emphysema, small 3-4 mm left lower lobe lung nodules.      07/28/2016 Initial Diagnosis    Rectal adenocarcinoma ([place])      07/28/2016 Initial Biopsy    Rectal mass biopsy showed invasive adenocarcinoma, polyps in the ascending colon and rectum showed tubular adenoma       07/28/2016 Procedure    Colonoscopy showed a nearly circumferential mass in the distal rectum, 4 polyps in the descending colon, diverticulosis in the left colon.       07/29/2016 Procedure    Low EUS showed clearly malignant 6 cm long, nearly circumferential mass in the distal rectum, with distal edge located to 1 cm from the annual approach, multiple prior rectal lymph nodes (6) are suspicious ,uT3 N2       08/17/2016 - 09/24/2016 Radiation Therapy    Neoadjuvant radiation to his rectal cancer 1) Rectum/ 45 Gy in 25 fx                      2) Boost/ 5.4 Gy in 3 fx       08/17/2016 - 09/24/2016 Chemotherapy    Xeloda 1500 mg twice daily with concurrent irradiation      08/18/2016 Imaging    PET scan showed hypermetabolic rectal mass, perirectal node 17m with mild increased uptake, no hypermetabolic distant metastasis, small pulmonary nodules are too small to characterize by PET.  11/29/2016 Imaging    CT of the C/A/P W Contrast IMPRESSION: 1. Interval treatment changes within the perirectal fat with otherwise stable rectosigmoid colon wall thickening. 2. No progressive adenopathy or distant metastases identified. 3. Stable bilateral pulmonary nodules. Short-term stability (for 4 months) is reassuring, although the nodules are too small to optimally characterize by previous PET-CT. Continued CT follow-up recommended.      12/01/2016 Surgery    Colon total resection and proctocolectomy for rectal cancer and Lynch syndrome       12/01/2016 Pathology Results     Proctocolectomy showed scattered small residual foci of invasive moderately differentiated adenocarcinoma embedded in submucosa tissue with extensive neoadjuvant effect, 2.5 cm in greatest time mention, adenocarcinoma invades through muscularis mucosa and involves subserosal soft tissue, margins are negative, 2 of 35 lymph nodes are positive for metastatic adenocarcinoma, 2 calcified and necrotic soft tissue nodules are also present without visible tumor seen.      01/07/2017 -  Chemotherapy    Adjuvant chemo CAPOX, changed to FOLFOX from cycle 2 due to poor tolerance        01/26/2017 Surgery    PLACEMENT OF PORT-A-CATH CENTRAL LINE WITH FLUOROSCOPY AND ULTRASOUND       Review of Systems  Constitutional: Negative.   HENT: Negative.   Eyes: Negative.   Respiratory: Negative.   Cardiovascular: Negative.   Gastrointestinal: Negative.   Genitourinary: Negative.   Musculoskeletal: Positive for myalgias and neck pain. Negative for back pain, falls and joint pain.  Skin: Negative.   Neurological: Negative.   Endo/Heme/Allergies: Negative.   Psychiatric/Behavioral: Negative.     Past Medical History:  Diagnosis Date  . Cancer ([facility]) 12/01/2016   colon   . Family history of colon cancer   . Poor dental hygiene     Past Surgical History:  Procedure Laterality Date  . EUS N/A 07/29/2016   Procedure: LOWER ENDOSCOPIC ULTRASOUND (EUS);  Surgeon: Rachael Fee, MD;  Location: [facility];  Service: Endoscopy;  Laterality: N/A;  . PORTACATH PLACEMENT N/A 01/26/2017   Procedure: PLACEMENT OF PORT-A-CATH CENTRAL LINE WITH FLUOROSCOPY AND ULTRASOUND;  Surgeon: Karie Soda, MD;  Location: [facility];  Service: General;  Laterality: N/A;  . PROCTOSCOPY N/A 12/01/2016   Procedure: RIGID PROCTOSCOPY;  Surgeon: Karie Soda, MD;  Location: [facility];  Service: General;  Laterality: N/A;  . TOE AMPUTATION Left   . 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    has No Known Allergies.  Allergies as of 04/11/2017   No Known Allergies     Medication List       Accurate as of 04/11/17  2:43 PM. Always use your most recent med list.          ALEVE 220 MG tablet Generic drug:  naproxen sodium Take 440 mg by mouth 2 (two) times daily as needed (for pain.).   ALPRAZolam 0.5 MG tablet Commonly known as:  XANAX Take 0.5 tablets (0.25 mg total) by mouth at bedtime as needed for anxiety. For anxiety   diphenoxylate-atropine 2.5-0.025 MG tablet Commonly  known as:  LOMOTIL Take 1-2 tablets by mouth 4 (four) times daily as needed for diarrhea or loose stools.   oxyCODONE 5 MG immediate release tablet Commonly known as:  Oxy IR/ROXICODONE TAKE 1 TABLET BY MOUTH EVERY 6 HOURS AS NEEDED FOR SEVERE PAIN   polyethylene glycol packet Commonly known as:  MIRALAX / GLYCOLAX Take 17 g by mouth daily as needed for moderate constipation.   prochlorperazine 10 MG tablet Commonly known as:  COMPAZINE Take 1 tablet (10 mg total) by mouth every 6 (six) hours as needed for nausea or vomiting.   traMADol 50 MG tablet Commonly known as:  ULTRAM Take 1-2 tablets (50-100 mg total) by mouth every 6 (six) hours as needed for moderate pain or severe pain (for pain.).        PHYSICAL EXAMINATION  Oncology Vitals 04/11/2017 04/04/2017  Height 180 cm -  Weight 65.635 kg -  Weight (lbs) 144 lbs 11 oz -  BMI (kg/m2) 20.18 kg/m2 -  Temp 98 -  Pulse 77 62  Resp 18 14  SpO2  100 99  BSA (m2) 1.81 m2 -   BP Readings from Last 2 Encounters:  04/11/17 (!) 148/96  04/04/17 (!) 148/94    Physical Exam  Constitutional: He is oriented to person, place, and time and well-developed, well-nourished, and in no distress. No distress.  HENT:  Head: Normocephalic and atraumatic.  Mouth/Throat: Oropharynx is clear and moist. No oropharyngeal exudate.  Eyes: Conjunctivae are normal. No scleral icterus.  Neck: Normal range of motion. Neck supple. No tracheal deviation present.  Cardiovascular: Normal rate, regular rhythm and normal heart sounds.  Exam reveals no gallop and no friction rub.   No murmur heard. Pulmonary/Chest: Effort normal and breath sounds normal. He has no wheezes. He has no rales.  Abdominal: Soft. Bowel sounds are normal. He exhibits no distension and no mass. There is no tenderness.  Colostomy in place with liquid brown stool.  Musculoskeletal: Normal range of motion. He exhibits tenderness. He exhibits no edema.  No tenderness with palpation over the spine. Trigger points in the left trapezius and neck that reproduces the patient's pain. He has full range of motion of the left shoulder, normal strength, sensation, and pulses bilaterally in the upper extremities.  Lymphadenopathy:    He has no cervical adenopathy.  Neurological: He is alert and oriented to person, place, and time.  Skin: Skin is warm and dry. He is not diaphoretic.  Psychiatric: Mood, memory, affect and judgment normal.    LABORATORY DATA:. No visits with results within 3 Day(s) from this visit.  Latest known visit with results is:  Appointment on 03/24/2017  Component Date Value Ref Range Status  . WBC 03/24/2017 7.4  4.0 - 10.3 10e3/uL Final  . NEUT# 03/24/2017 6.0  1.5 - 6.5 10e3/uL Final  . HGB 03/24/2017 14.6  13.0 - 17.1 g/dL Final  . HCT 03/24/2017 44.1  38.4 - 49.9 % Final  . Platelets 03/24/2017 108* 140 - 400 10e3/uL Final  . MCV 03/24/2017 83.4  79.3 - 98.0 fL Final    . MCH 03/24/2017 27.7  27.2 - 33.4 pg Final  . MCHC 03/24/2017 33.2  32.0 - 36.0 g/dL Final  . RBC 03/24/2017 5.29  4.20 - 5.82 10e6/uL Final  . RDW 03/24/2017 18.0* 11.0 - 14.6 % Final  . lymph# 03/24/2017 0.5* 0.9 - 3.3 10e3/uL Final  . MONO# 03/24/2017 0.7  0.1 - 0.9 10e3/uL Final  . Eosinophils Absolute 03/24/2017 0.1  0.0 - 0.5 10e3/uL Final  . Basophils Absolute 03/24/2017 0.0  0.0 - 0.1 10e3/uL Final  . NEUT% 03/24/2017 80.7* 39.0 - 75.0 % Final  . LYMPH% 03/24/2017 7.4* 14.0 - 49.0 % Final  . MONO% 03/24/2017 9.5  0.0 - 14.0 % Final  . EOS% 03/24/2017 1.9  0.0 - 7.0 % Final  . BASO% 03/24/2017 0.5  0.0 - 2.0 % Final  . Sodium 03/24/2017 139  136 - 145 mEq/L Final  . Potassium 03/24/2017 4.4  3.5 - 5.1 mEq/L Final  . Chloride 03/24/2017 112* 98 - 109 mEq/L Final  . CO2 03/24/2017 21* 22 - 29 mEq/L Final  . Glucose 03/24/2017 93  70 - 140 mg/dl Final  . BUN 03/24/2017 11.0  7.0 - 26.0 mg/dL Final  . Creatinine 03/24/2017 0.8  0.7 - 1.3 mg/dL Final  . Total Bilirubin 03/24/2017 0.26  0.20 - 1.20 mg/dL Final  . Alkaline Phosphatase 03/24/2017 61  40 - 150 U/L Final  . AST 03/24/2017 33  5 - 34 U/L Final  . ALT 03/24/2017 17  0 - 55 U/L Final  . Total Protein 03/24/2017 5.0* 6.4 - 8.3 g/dL Final  . Albumin 03/24/2017 2.4* 3.5 - 5.0 g/dL Final  . Calcium 03/24/2017 8.5  8.4 - 10.4 mg/dL Final  . Anion Gap 03/24/2017 6  3 - 11 mEq/L Final  . EGFR 03/24/2017 >90  >90 ml/min/1.73 m2 Final  . CEA ([facility]) 03/24/2017 1.74  0.00 - 5.00 ng/mL Final  . Technologist Review 03/24/2017 Oc meta   Final    RADIOGRAPHIC STUDIES: No results found.  ASSESSMENT/PLAN:    No problem-specific Assessment & Plan notes found for this encounter.  This is a 46 year old gentleman with:  1. Left arm pain possibly due to muscle spasm or bursitis. We discussed that he is taking too much Aleve. We talked about performing exercises to his neck and shoulder to help with the pain. I printed out  exercises for him to try at home. I have given him a small amount oxycodone to be used for short-term pain control. I have also given him Robaxin to be used for short-term. Prescriptions were given today. We discussed that if his pain is not improving, will consider referral to orthopedics.  2. Rectal adenocarcinoma. I have reviewed the patient's lab work and counts are adequate for treatment later this week. The patient will proceed with cycle 6 of FOLFOX as scheduled on 04/14/2017. I have sent a message to scheduling to adjust all future appointments since she has missed one week and his schedules no longer correct. I have refilled his Lomotil for diarrhea.  Patient stated understanding of all instructions; and was in agreement with this plan of care. The patient knows to call the clinic with any problems, questions or concerns.   Total time spent with patient was 25 minutes;  with greater than 50 percent of that time spent in face to face counseling regarding patient's symptoms,  and coordination of care and follow up.   Mikey Bussing, NP 04/11/2017

## 2017-04-11 NOTE — Telephone Encounter (Signed)
Wife called requesting a call back from nurse.  Spoke with wife and was informed that pt went to ER last week 4/16 for arm pain.  Stated pt had xray of the left arm ; was told that it was muscle spasm.  Wife stated pt was able to use Left arm for normal ADLs but very painful.   Pt also missed chemo appt last Thursday  4/19. Erasmo Downer, NP notified.  Instructed wife to bring pt in now for [facility] visit with labs.  Wife voiced understanding. Dr. Burr Medico also notified. Pt's    Phone    [phone].

## 2017-04-12 ENCOUNTER — Ambulatory Visit: Payer: Self-pay | Admitting: Radiation Oncology

## 2017-04-14 ENCOUNTER — Ambulatory Visit (HOSPITAL_BASED_OUTPATIENT_CLINIC_OR_DEPARTMENT_OTHER): Payer: Medicaid Other

## 2017-04-14 VITALS — BP 102/81 | HR 100 | Temp 97.7°F | Resp 16

## 2017-04-14 DIAGNOSIS — Z5111 Encounter for antineoplastic chemotherapy: Secondary | ICD-10-CM | POA: Diagnosis present

## 2017-04-14 DIAGNOSIS — C20 Malignant neoplasm of rectum: Secondary | ICD-10-CM

## 2017-04-14 MED ORDER — SODIUM CHLORIDE 0.9 % IV SOLN
2400.0000 mg/m2 | INTRAVENOUS | Status: DC
  Administered 2017-04-14: 4350 mg via INTRAVENOUS
  Filled 2017-04-14: qty 87

## 2017-04-14 MED ORDER — DEXAMETHASONE SODIUM PHOSPHATE 10 MG/ML IJ SOLN
10.0000 mg | Freq: Once | INTRAMUSCULAR | Status: AC
  Administered 2017-04-14: 10 mg via INTRAVENOUS

## 2017-04-14 MED ORDER — DEXTROSE 5 % IV SOLN
Freq: Once | INTRAVENOUS | Status: AC
  Administered 2017-04-14: 09:00:00 via INTRAVENOUS

## 2017-04-14 MED ORDER — SODIUM CHLORIDE 0.9% FLUSH
10.0000 mL | INTRAVENOUS | Status: DC | PRN
  Filled 2017-04-14: qty 10

## 2017-04-14 MED ORDER — LEUCOVORIN CALCIUM INJECTION 350 MG
400.0000 mg/m2 | Freq: Once | INTRAVENOUS | Status: AC
  Administered 2017-04-14: 728 mg via INTRAVENOUS
  Filled 2017-04-14: qty 36.4

## 2017-04-14 MED ORDER — PALONOSETRON HCL INJECTION 0.25 MG/5ML
INTRAVENOUS | Status: AC
  Filled 2017-04-14: qty 5

## 2017-04-14 MED ORDER — HEPARIN SOD (PORK) LOCK FLUSH 100 UNIT/ML IV SOLN
500.0000 [IU] | Freq: Once | INTRAVENOUS | Status: DC | PRN
  Filled 2017-04-14: qty 5

## 2017-04-14 MED ORDER — OXALIPLATIN CHEMO INJECTION 100 MG/20ML
83.0000 mg/m2 | Freq: Once | INTRAVENOUS | Status: AC
  Administered 2017-04-14: 150 mg via INTRAVENOUS
  Filled 2017-04-14: qty 10

## 2017-04-14 MED ORDER — PALONOSETRON HCL INJECTION 0.25 MG/5ML
0.2500 mg | Freq: Once | INTRAVENOUS | Status: AC
  Administered 2017-04-14: 0.25 mg via INTRAVENOUS

## 2017-04-14 MED ORDER — DEXAMETHASONE SODIUM PHOSPHATE 10 MG/ML IJ SOLN
INTRAMUSCULAR | Status: AC
  Filled 2017-04-14: qty 1

## 2017-04-14 NOTE — Patient Instructions (Signed)
[facility] Discharge Instructions for Patients Receiving Chemotherapy  Today you received the following chemotherapy agents: Oxaliplatin, Leucovorin and Adrucil.   To help prevent nausea and vomiting after your treatment, we encourage you to take your nausea medication as directed.  If you develop nausea and vomiting that is not controlled by your nausea medication, call the clinic.   BELOW ARE SYMPTOMS THAT SHOULD BE REPORTED IMMEDIATELY:  *FEVER GREATER THAN 100.5 F  *CHILLS WITH OR WITHOUT FEVER  NAUSEA AND VOMITING THAT IS NOT CONTROLLED WITH YOUR NAUSEA MEDICATION  *UNUSUAL SHORTNESS OF BREATH  *UNUSUAL BRUISING OR BLEEDING  TENDERNESS IN MOUTH AND THROAT WITH OR WITHOUT PRESENCE OF ULCERS  *URINARY PROBLEMS  *BOWEL PROBLEMS  UNUSUAL RASH Items with * indicate a potential emergency and should be followed up as soon as possible.  Feel free to call the clinic you have any questions or concerns. The clinic phone number is [phone].  Please show the CHEMO ALERT CARD at check-in to the Emergency Department and triage nurse.   

## 2017-04-16 ENCOUNTER — Ambulatory Visit (HOSPITAL_BASED_OUTPATIENT_CLINIC_OR_DEPARTMENT_OTHER): Payer: Self-pay

## 2017-04-16 VITALS — BP 106/80 | HR 80 | Temp 97.8°F | Resp 16

## 2017-04-16 DIAGNOSIS — C20 Malignant neoplasm of rectum: Secondary | ICD-10-CM

## 2017-04-16 MED ORDER — HEPARIN SOD (PORK) LOCK FLUSH 100 UNIT/ML IV SOLN
500.0000 [IU] | Freq: Once | INTRAVENOUS | Status: AC | PRN
  Administered 2017-04-16: 500 [IU]
  Filled 2017-04-16: qty 5

## 2017-04-16 MED ORDER — SODIUM CHLORIDE 0.9% FLUSH
10.0000 mL | INTRAVENOUS | Status: DC | PRN
  Administered 2017-04-16: 10 mL
  Filled 2017-04-16: qty 10

## 2017-04-18 ENCOUNTER — Telehealth: Payer: Self-pay | Admitting: Hematology

## 2017-04-18 NOTE — Telephone Encounter (Signed)
Left message for patient re next appointment 5/10. Schedule mailed.

## 2017-04-21 ENCOUNTER — Ambulatory Visit: Payer: Medicaid Other | Admitting: Hematology

## 2017-04-21 ENCOUNTER — Other Ambulatory Visit: Payer: Medicaid Other

## 2017-04-21 ENCOUNTER — Ambulatory Visit: Payer: Medicaid Other

## 2017-04-21 ENCOUNTER — Encounter: Payer: Medicaid Other | Admitting: Nutrition

## 2017-04-22 ENCOUNTER — Telehealth: Payer: Self-pay | Admitting: *Deleted

## 2017-04-22 NOTE — Telephone Encounter (Signed)
CALLED PATIENT TO ALTER FU TO 05-06-17 @ 1:30 PM, SPOKE WITH PATIENT'S GIRLFRIEND CASEY AND SHE IS AWARE OF THIS APPT. CHANGE AND IS GOOD WITH THIS

## 2017-04-27 NOTE — Progress Notes (Signed)
[place]  [phone] [phone]  Clinic follow Up Note   Patient Care Team: Patient, No Pcp Per as PCP - General (General Practice) Tania Ade, RN as Registered Nurse Michael Boston, MD as Consulting Physician (General Surgery) [name], Kirke Corin, MD as Consulting Physician (Gastroenterology) Kyung Rudd, MD as Consulting Physician (Radiation Oncology) Truitt Merle, MD as Consulting Physician (Oncology) 04/28/2017  CHIEF COMPLAINTS:  Follow up rectal cancer  Oncology History   Rectal adenocarcinoma [facility])   Staging form: Colon and Rectum, AJCC 7th Edition   - Clinical stage from 07/28/2016: Stage IIIB (T3, N2a, M0) - Signed by Truitt Merle, MD on 08/06/2016      Rectal adenocarcinoma s/p protctocolectomy, IPAA "J" pouch 12/01/2016   07/26/2016 Imaging    CT chest, abdomen and pelvis with contrast showed nodular thickening of the rectal wall, enlarged perirectal lymph nodes. Diverticulosis. Nonspecific low-level lymphadenopathy. Lower lobe emphysema, small 3-4 mm left lower lobe lung nodules.      07/28/2016 Initial Diagnosis    Rectal adenocarcinoma ([place])      07/28/2016 Initial Biopsy    Rectal mass biopsy showed invasive adenocarcinoma, polyps in the ascending colon and rectum showed tubular adenoma       07/28/2016 Procedure    Colonoscopy showed a nearly circumferential mass in the distal rectum, 4 polyps in the descending colon, diverticulosis in the left colon.       07/29/2016 Procedure    Low EUS showed clearly malignant 6 cm long, nearly circumferential mass in the distal rectum, with distal edge located to 1 cm from the annual approach, multiple prior rectal lymph nodes (6) are suspicious ,uT3 N2       08/17/2016 - 09/24/2016 Radiation Therapy    Neoadjuvant radiation to his rectal cancer 1) Rectum/ 45 Gy in 25 fx                      2) Boost/ 5.4 Gy in 3 fx       08/17/2016 - 09/24/2016 Chemotherapy    Xeloda 1500 mg twice daily  with concurrent irradiation      08/18/2016 Imaging    PET scan showed hypermetabolic rectal mass, perirectal node 72m with mild increased uptake, no hypermetabolic distant metastasis, small pulmonary nodules are too small to characterize by PET.       11/29/2016 Imaging    CT of the C/A/P W Contrast IMPRESSION: 1. Interval treatment changes within the perirectal fat with otherwise stable rectosigmoid colon wall thickening. 2. No progressive adenopathy or distant metastases identified. 3. Stable bilateral pulmonary nodules. Short-term stability (for 4 months) is reassuring, although the nodules are too small to optimally characterize by previous PET-CT. Continued CT follow-up recommended.      12/01/2016 Surgery    Colon total resection and proctocolectomy for rectal cancer and Lynch syndrome       12/01/2016 Pathology Results    Proctocolectomy showed scattered small residual foci of invasive moderately differentiated adenocarcinoma embedded in submucosa tissue with extensive neoadjuvant effect, 2.5 cm in greatest time mention, adenocarcinoma invades through muscularis mucosa and involves subserosal soft tissue, margins are negative, 2 of 35 lymph nodes are positive for metastatic adenocarcinoma, 2 calcified and necrotic soft tissue nodules are also present without visible tumor seen.      01/07/2017 -  Chemotherapy    Adjuvant chemo CAPOX, changed to FOLFOX from cycle 2 due to poor tolerance        01/26/2017 Surgery  PLACEMENT OF PORT-A-CATH CENTRAL LINE WITH FLUOROSCOPY AND ULTRASOUND       HISTORY OF PRESENTING ILLNESS:  [name] 46 y.o. male is here because of His newly diagnosed rectal cancer. He is accompanied by his girlfriend to our multidisciplinary GI clinic today.  He has had rectal pain for 6 weeks, and mild rectal bleeding with BM, he goes 4 times a day, very little each time, and pencil shaped stool, he has lost 30 lbs so far. His appettie remains to be  good, he eats well, no cough, nausea, no abd pain. He has a moderate fatigue, he will 2 tolerate his routine activities including his job without much difficulty. He does not have a primary care physician, and has not seen doctors for many years.  He presents to emergency room on 07/26/2016 for the rectal pain and bleeding, and was referred to gastroenterologist Dr. Loletha Carrow. He underwent colonoscopy on 07/28/2016, which showed 4 polyps in the ascending colon, and a circumferential distal rectal mass. Biopsy of the rectal mass showed adenocarcinoma. He underwent EUS by Dr. Ardis Hughs on 07/29/2016 which showed a T3 N2 disease.   CURRENT THERAPY: adjuvant chemo CAPOX, changed to FOLFOX from cycle 2   INTERIM HISTORY  Mr. Strollo returns for follow-up and cycle 7 chemo. He presents to the clinic with reports to not feeling too bad after treatment. He just feel tingling right after chemo and no numbness or loss of muscle control. He is getting back to his weight slowly. Overall he feels good and no reports of any pain. He drinks enough water and no longer needs IV Fluids. His sister died at 38 of Leukemia. Both his sister and father past in May. He went to the ER for muscle spasm on the left should and arm. He feels numbness in the left lower arm on and off.     MEDICAL HISTORY:  Past Medical History:  Diagnosis Date  . Cancer ([place]) 12/01/2016   colon   . Family history of colon cancer   . Poor dental hygiene        SURGICAL HISTORY: Past Surgical History:  Procedure Laterality Date  . EUS N/A 07/29/2016   Procedure: LOWER ENDOSCOPIC ULTRASOUND (EUS);  Surgeon: Milus Banister, MD;  Location: [facility];  Service: Endoscopy;  Laterality: N/A;  . PORTACATH PLACEMENT N/A 01/26/2017   Procedure: PLACEMENT OF PORT-A-CATH CENTRAL LINE WITH FLUOROSCOPY AND ULTRASOUND;  Surgeon: Michael Boston, MD;  Location: [facility];  Service: General;  Laterality: N/A;  . PROCTOSCOPY N/A 12/01/2016    Procedure: RIGID PROCTOSCOPY;  Surgeon: Michael Boston, MD;  Location: [facility];  Service: General;  Laterality: N/A;  . TOE AMPUTATION Left   . XI ROBOTIC ASSISTED LOWER ANTERIOR RESECTION N/A 12/01/2016   Procedure: XI ROBOTIC ASSISTED PROCTOCOLECTOMY WITH ILLEOPOUCH ANASTAMOSIS WITH DIVERTING ILLEOSTOMY;  Surgeon: Michael Boston, MD;  Location: [facility];  Service: General;  Laterality: N/A;    SOCIAL HISTORY: Social History   Social History  . Marital status: Married    Spouse name: N/A  . Number of children: 3  . Years of education: N/A   Occupational History  . Not on file.   Social History Main Topics  . Smoking status: Former Smoker    Packs/day: 1.50    Years: 30.00    Types: Cigarettes    Quit date: 12/01/2016  . Smokeless tobacco: Never Used  . Alcohol use No  . Drug use: No     Comment: patient  denies  . Sexual activity: Not on file   Other Topics Concern  . Not on file   Social History Narrative   Lives with significant other, [name]   Have a 15 year old son   Smoker      [place] HISTORY: Family History  Problem Relation Age of Onset  . Diabetes Mother   . COPD Father   . Colon cancer Maternal Aunt        dx in her 79s  . Diabetes Maternal Grandmother   . Brain cancer Maternal Grandfather   . Lung cancer Paternal Grandfather   . Colon cancer Cousin        dx in her 65s  . Bone cancer Sister 8  . Pancreatic cancer Neg Hx   . Esophageal cancer Neg Hx   . Stomach cancer Neg Hx   . Liver disease Neg Hx    He lives with his girfriend and their 15 yo son, works for [employer]   ALLERGIES:  has No Known Allergies.  MEDICATIONS:  Current Outpatient Prescriptions  Medication Sig Dispense Refill  . ALPRAZolam (XANAX) 0.5 MG tablet Take 0.5 tablets (0.25 mg total) by mouth at bedtime as needed for anxiety. For anxiety 20 tablet 0  . diphenoxylate-atropine (LOMOTIL) 2.5-0.025 MG tablet Take 1-2 tablets by mouth 4 (four) times  daily as needed for diarrhea or loose stools. 60 tablet 1  . methocarbamol (ROBAXIN) 500 MG tablet Take 1 tablet (500 mg total) by mouth every 6 (six) hours as needed for muscle spasms. 30 tablet 0  . naproxen sodium (ALEVE) 220 MG tablet Take 440 mg by mouth 2 (two) times daily as needed (for pain.).    Marland Kitchen oxyCODONE (OXY IR/ROXICODONE) 5 MG immediate release tablet TAKE 1 TABLET BY MOUTH EVERY 6 HOURS AS NEEDED FOR SEVERE PAIN 30 tablet 0  . polyethylene glycol (MIRALAX / GLYCOLAX) packet Take 17 g by mouth daily as needed for moderate constipation.     . prochlorperazine (COMPAZINE) 10 MG tablet Take 1 tablet (10 mg total) by mouth every 6 (six) hours as needed for nausea or vomiting. 30 tablet 2  . traMADol (ULTRAM) 50 MG tablet Take 1-2 tablets (50-100 mg total) by mouth every 6 (six) hours as needed for moderate pain or severe pain (for pain.). 30 tablet 0   Current Facility-Administered Medications  Medication Dose Route Frequency Provider Last Rate Last Dose  . 0.9 %  sodium chloride infusion  500 mL Intravenous Continuous [name], Estill Cotta III, MD        REVIEW OF SYSTEMS:  Constitutional: Denies fevers, chills or abnormal night sweats Eyes: Denies blurriness of vision, double vision or watery eyes Ears, nose, mouth, throat, and face: Denies mucositis or sore throat Respiratory: Denies cough, dyspnea or wheezes Cardiovascular: Denies palpitation, chest discomfort or lower extremity swelling Gastrointestinal:  Denies heartburn (+) improved diarrhea Skin: Denies abnormal skin rashes Lymphatics: Denies new lymphadenopathy or easy bruising Neurological: (+) cold sensations (+) neuroppsy in hands (+) numbness in left lower arm Behavioral/Psych: Mood is stable, no new changes  All other systems were reviewed with the patient and are negative.  PHYSICAL EXAMINATION:  ECOG PERFORMANCE STATUS: 1  Vitals:   04/28/17 1209  BP: 113/76  Pulse: 77  Resp: 16  Temp: 97.9 F (36.6 C)   Filed  Weights   04/28/17 1209  Weight: 143 lb 14.4 oz (65.3 kg)     GENERAL:alert, no distress and  comfortable SKIN: skin color, texture, turgor are normal, no rashes or significant lesions EYES: normal, conjunctiva are pink and non-injected, sclera clear OROPHARYNX:no exudate, no erythema and lips, buccal mucosa, and tongue normal  NECK: supple, thyroid normal size, non-tender, without nodularity LYMPH:  no palpable lymphadenopathy in the cervical, axillary or inguinal LUNGS: clear to auscultation and percussion with normal breathing effort HEART: regular rate & rhythm and no murmurs and no lower extremity edema ABDOMEN:abdomen soft, non-tender and normal bowel sounds. (+) Ileostomy bag with red watery substance in the right abdomen, surgical incision sites has healed well. Musculoskeletal:no cyanosis of digits and no clubbing  PSYCH: alert & oriented x 3 with fluent speech NEURO: no focal motor/sensory deficits  LABORATORY DATA:  I have reviewed the data as listed CBC Latest Ref Rng & Units 04/28/2017 04/11/2017 03/24/2017  WBC 4.0 - 10.3 10e3/uL 7.5 5.5 7.4  Hemoglobin 13.0 - 17.1 g/dL 14.5 14.5 14.6  Hematocrit 38.4 - 49.9 % 44.2 44.8 44.1  Platelets 140 - 400 10e3/uL 120(L) 182 108(L)   CMP Latest Ref Rng & Units 04/28/2017 04/11/2017 03/24/2017  Glucose 70 - 140 mg/dl 177(H) 108 93  BUN 7.0 - 26.0 mg/dL 6.7(L) 8.9 11.0  Creatinine 0.7 - 1.3 mg/dL 0.8 0.9 0.8  Sodium 136 - 145 mEq/L 139 140 139  Potassium 3.5 - 5.1 mEq/L 3.7 4.3 4.4  Chloride 101 - 111 mmol/L - - -  CO2 22 - 29 mEq/L 21(L) 21(L) 21(L)  Calcium 8.4 - 10.4 mg/dL 8.5 8.3(L) 8.5  Total Protein 6.4 - 8.3 g/dL 5.1(L) 4.9(L) 5.0(L)  Total Bilirubin 0.20 - 1.20 mg/dL 0.40 <0.22 0.26  Alkaline Phos 40 - 150 U/L 62 59 61  AST 5 - 34 U/L 27 31 33  ALT 0 - 55 U/L _0 Pathology report  Diagnosis 07/28/2016 1. Surgical [P], descending, polyps(2) -TUBULAR ADENOMAS. -NO HIGH GRADE DYSPLASIA OR MALIGNANCY IDENTIFIED. 2.  Surgical [P], rectal mass -INVASIVE ADENOCARCINOMA. -SEE COMMENT. 3. Surgical [P], rectum, polyps(2) -TUBULAR ADENOMAS. -NO HIGH GRADE DYSPLASIA OR MALIGNANCY IDENTIFIED.    Diagnosis 12/01/2016 1. Colon, total resection (incl lymph nodes), proctocolectomy - SCATTERED SMALL RESIDUAL FOCI (EACH LESS THAN 0.3 CM) OF INVASIVE MODERATELY DIFFERENTIATED ADENOCARCINOMA EMBEDDED IN SUBMUCOSAL TISSUE WITH EXTENSIVE NEOADJUVANT EFFECT. - TUMOR IS PRESENT WITHIN A REGION MEASURING APPROXIMATELY 2.5 CM IN GREATEST DIMENSION. - ADENOCARCINOMA INVADES THROUGH MUSCULARIS MUCOSA AND INVOLVES SUBSEROSAL SOFT TISSUE. - MARGINS ARE NEGATIVE. - TWO OF THIRTY-FIVE LYMPH NODES ARE POSITIVE FOR METASTATIC ADENOCARCINOMA (2/35). - LYMPH NODES ALSO DEMONSTRATE VARIABLE DEGREES OF NEOADJUVANT EFFECT. - TWO CALCIFIED AND NECROTIC SOFT TISSUE NODULES ARE ALSO PRESENT WITHOUT VIABLE TUMOR SEEN. - SEE ONCOLOGY TEMPLATE. 2. Colon, resection margin (donut), distal ring final distal margin - BENIGN ANAL MUCOSA. - NO DYSPLASIA OR TUMOR SEEN.  RADIOGRAPHIC STUDIES: I have personally reviewed the radiological images as listed and agreed with the findings in the report.  EUS 07/29/2016 -Clearly malignant 6cm long, nearly circumferential mass in the distal rectum with distal edge located 1cm from the anal verge. If this is rectal adenocarcinoma it is uT3N2a, Stage IIIa.  COLONOSCOPY 07/28/2016 - Rectal mass. - Four 4 mm polyps in the rectum and in the descending colon, removed with a cold snare. Resected and retrieved. - Diverticulosis in the left colon. - Likely malignant tumor in the distal rectum. Biopsied. Tattooed.  CT of the chest/abd/pelvis with contrast 11/29/2016 IMPRESSION: 1. Interval treatment changes within the perirectal fat with otherwise stable rectosigmoid colon wall thickening.  2. No progressive adenopathy or distant metastases identified. 3. Stable bilateral pulmonary nodules. Short-term  stability (for 4 months) is reassuring, although the nodules are too small to optimally characterize by previous PET-CT. Continued CT follow-up recommended.   ASSESSMENT & PLAN: 46 y.o. male without significant past medical history presented with rectal pain and bleeding.  1. 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 He also has a slightly prominent periaortic lymph node, about 8 mm. -I previously reviewed his PET CT scan findings with patient and his girl firend, the small lung nodules and periaortic lymph node are not hypermetabolic, no definitive evidence of metastasis on the PET scan. -He previously completed neoadjuvant chemoradiation, tolerated moderate well overall.  -I previously reviewed his surgical pathology findings in great details. He had computed surgical resection, but unfortunately still has significant residual disease including 2 positive lymph nodes. -I previously recommended him to have adjuvant chemotherapy to reduce his risk of recurrence. Due to the positive lymph nodes, I recommend FOLFOX or CAPOX for 4.5 months  -The goal of therapy is curative. However his lung nodules are suspicious for metastatic disease, needs to be followed closely. If he does have lung metastasis, then his cancer is unlikely curable. -He did poorly with the first cycle CAPOX, chemo changed to FOLFOX from cycle 2 -He is tolerating chemotherapy well, weight is stable, diarrhea improved, he does not require a regular IV fluids lately.  -Lab previously reviewed, mild from cytopenia, adequate for treatment, we'll continue  chemotherapy today -We will repeat staging scan after done with chemo - Will see patient after every other treatment since he is doing well -Labs reviewed and adequate to continue cycle 7 today  2. Diarrhea  -He has watery stool after total colectomy, his ileostomy output has significant increases since he started chemotherapy -I encouraged him to continue using Imodium and Lomotil.  -Hysteria has much improved with Lomotil, he will continue -I have previously set him up for IV fluids 2-3 times a week, will cut down once a week now  - Diarrhea has improved previously   3. 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 -He has undergone total colectomy and proctocolectomy, his risk of secondary colon cancer is minimal -We previously discussed other cancer risks from age syndrome, I recommend him to have EGD every 3-5 years, annual urinalysis for GU cancer screening.   4. Anxiety  -He previously felt nervous and anxious since his cancer diagnosis, much improved after starting treatment  -Continue Xanax as needed  5. Social issues -He is out of work, he wants to apply for Medicaid and disability  -He was seen by our Education officer, museum  6. Headache and pain at ileostomy bag -I previously encouraged him to use Tylenol for headaches.  -He has stated his pain is unbearable sometimes. He is taking oxycodone. 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 - Patient is no longer taking Oxycodone for pain  Plan  -Lab, flush, and chemo FOLFOX in 2 and  4 weeks, he will complete chemo in 4 weeks  -f/u in 4 weeks, will order restaging scan on next visit    All questions were answered. The patient knows to call the clinic with any problems, questions or concerns. I spent 20 minutes  counseling the patient face to face. The total time spent in the appointment was 25 minutes and more than 50% was on counseling.  I have reviewed the above documentation for accuracy and completeness, and I agree with the above information.   This document serves as a record of services personally performed by Truitt Merle, MD. It was created on her behalf by Joslyn Devon, a trained medical scribe. The creation of this record is based on the scribe's personal observations and the provider's statements to them. This document has been checked and approved by the attending provider.     Truitt Merle, MD 04/28/2017

## 2017-04-28 ENCOUNTER — Ambulatory Visit: Payer: Medicaid Other

## 2017-04-28 ENCOUNTER — Ambulatory Visit (HOSPITAL_BASED_OUTPATIENT_CLINIC_OR_DEPARTMENT_OTHER): Payer: Medicaid Other | Admitting: Hematology

## 2017-04-28 ENCOUNTER — Ambulatory Visit (HOSPITAL_BASED_OUTPATIENT_CLINIC_OR_DEPARTMENT_OTHER): Payer: Medicaid Other

## 2017-04-28 ENCOUNTER — Ambulatory Visit: Payer: Medicaid Other | Admitting: Nutrition

## 2017-04-28 ENCOUNTER — Other Ambulatory Visit (HOSPITAL_BASED_OUTPATIENT_CLINIC_OR_DEPARTMENT_OTHER): Payer: Medicaid Other

## 2017-04-28 VITALS — BP 113/76 | HR 77 | Temp 97.9°F | Resp 16 | Ht 71.0 in | Wt 143.9 lb

## 2017-04-28 DIAGNOSIS — R918 Other nonspecific abnormal finding of lung field: Secondary | ICD-10-CM

## 2017-04-28 DIAGNOSIS — R197 Diarrhea, unspecified: Secondary | ICD-10-CM | POA: Diagnosis not present

## 2017-04-28 DIAGNOSIS — R51 Headache: Secondary | ICD-10-CM | POA: Diagnosis not present

## 2017-04-28 DIAGNOSIS — C20 Malignant neoplasm of rectum: Secondary | ICD-10-CM

## 2017-04-28 DIAGNOSIS — Z5111 Encounter for antineoplastic chemotherapy: Secondary | ICD-10-CM

## 2017-04-28 DIAGNOSIS — Z1509 Genetic susceptibility to other malignant neoplasm: Secondary | ICD-10-CM | POA: Diagnosis not present

## 2017-04-28 DIAGNOSIS — F418 Other specified anxiety disorders: Secondary | ICD-10-CM | POA: Diagnosis not present

## 2017-04-28 DIAGNOSIS — R52 Pain, unspecified: Secondary | ICD-10-CM

## 2017-04-28 LAB — CBC WITH DIFFERENTIAL/PLATELET
BASO%: 0.7 % (ref 0.0–2.0)
BASOS ABS: 0.1 10*3/uL (ref 0.0–0.1)
EOS ABS: 0.1 10*3/uL (ref 0.0–0.5)
EOS%: 1.3 % (ref 0.0–7.0)
HCT: 44.2 % (ref 38.4–49.9)
HGB: 14.5 g/dL (ref 13.0–17.1)
LYMPH%: 7.1 % — AB (ref 14.0–49.0)
MCH: 27.6 pg (ref 27.2–33.4)
MCHC: 32.9 g/dL (ref 32.0–36.0)
MCV: 84.1 fL (ref 79.3–98.0)
MONO#: 0.5 10*3/uL (ref 0.1–0.9)
MONO%: 6.4 % (ref 0.0–14.0)
NEUT%: 84.5 % — ABNORMAL HIGH (ref 39.0–75.0)
NEUTROS ABS: 6.3 10*3/uL (ref 1.5–6.5)
Platelets: 120 10*3/uL — ABNORMAL LOW (ref 140–400)
RBC: 5.25 10*6/uL (ref 4.20–5.82)
RDW: 19.7 % — ABNORMAL HIGH (ref 11.0–14.6)
WBC: 7.5 10*3/uL (ref 4.0–10.3)
lymph#: 0.5 10*3/uL — ABNORMAL LOW (ref 0.9–3.3)

## 2017-04-28 LAB — COMPREHENSIVE METABOLIC PANEL
ALT: 20 U/L (ref 0–55)
AST: 27 U/L (ref 5–34)
Albumin: 2.6 g/dL — ABNORMAL LOW (ref 3.5–5.0)
Alkaline Phosphatase: 62 U/L (ref 40–150)
Anion Gap: 6 mEq/L (ref 3–11)
BUN: 6.7 mg/dL — ABNORMAL LOW (ref 7.0–26.0)
CO2: 21 meq/L — AB (ref 22–29)
Calcium: 8.5 mg/dL (ref 8.4–10.4)
Chloride: 112 mEq/L — ABNORMAL HIGH (ref 98–109)
Creatinine: 0.8 mg/dL (ref 0.7–1.3)
GLUCOSE: 177 mg/dL — AB (ref 70–140)
POTASSIUM: 3.7 meq/L (ref 3.5–5.1)
SODIUM: 139 meq/L (ref 136–145)
Total Bilirubin: 0.4 mg/dL (ref 0.20–1.20)
Total Protein: 5.1 g/dL — ABNORMAL LOW (ref 6.4–8.3)

## 2017-04-28 MED ORDER — OXALIPLATIN CHEMO INJECTION 100 MG/20ML
82.0000 mg/m2 | Freq: Once | INTRAVENOUS | Status: AC
  Administered 2017-04-28: 150 mg via INTRAVENOUS
  Filled 2017-04-28: qty 20

## 2017-04-28 MED ORDER — SODIUM CHLORIDE 0.9 % IV SOLN
2400.0000 mg/m2 | INTRAVENOUS | Status: DC
  Administered 2017-04-28: 4350 mg via INTRAVENOUS
  Filled 2017-04-28: qty 87

## 2017-04-28 MED ORDER — PALONOSETRON HCL INJECTION 0.25 MG/5ML
INTRAVENOUS | Status: AC
  Filled 2017-04-28: qty 5

## 2017-04-28 MED ORDER — DEXTROSE 5 % IV SOLN
Freq: Once | INTRAVENOUS | Status: AC
  Administered 2017-04-28: 13:00:00 via INTRAVENOUS

## 2017-04-28 MED ORDER — DEXAMETHASONE SODIUM PHOSPHATE 10 MG/ML IJ SOLN
10.0000 mg | Freq: Once | INTRAMUSCULAR | Status: AC
  Administered 2017-04-28: 10 mg via INTRAVENOUS

## 2017-04-28 MED ORDER — LEUCOVORIN CALCIUM INJECTION 350 MG
400.0000 mg/m2 | Freq: Once | INTRAVENOUS | Status: AC
  Administered 2017-04-28: 728 mg via INTRAVENOUS
  Filled 2017-04-28: qty 36.4

## 2017-04-28 MED ORDER — PALONOSETRON HCL INJECTION 0.25 MG/5ML
0.2500 mg | Freq: Once | INTRAVENOUS | Status: AC
  Administered 2017-04-28: 0.25 mg via INTRAVENOUS

## 2017-04-28 MED ORDER — DEXAMETHASONE SODIUM PHOSPHATE 10 MG/ML IJ SOLN
INTRAMUSCULAR | Status: AC
  Filled 2017-04-28: qty 1

## 2017-04-28 NOTE — Progress Notes (Signed)
Brief nutrition follow-up completed with patient during chemotherapy for rectal cancer. Weight is stable was documented as 143.9 pounds on the 10th. Patient denies nutrition impact symptoms and reports good appetite and increased oral intake.  Nutrition diagnosis: Severe malnutrition improved.  Educated patient to continue adequate calories and protein intake as well as oral nutrition supplements as needed. Encouraged patient to contact me if he develops questions or concerns.  He has my contact information.

## 2017-04-28 NOTE — Patient Instructions (Signed)
[facility] Discharge Instructions for Patients Receiving Chemotherapy  Today you received the following chemotherapy agents: Oxaliplatin, Leucovorin and Adrucil.   To help prevent nausea and vomiting after your treatment, we encourage you to take your nausea medication as directed.  If you develop nausea and vomiting that is not controlled by your nausea medication, call the clinic.   BELOW ARE SYMPTOMS THAT SHOULD BE REPORTED IMMEDIATELY:  *FEVER GREATER THAN 100.5 F  *CHILLS WITH OR WITHOUT FEVER  NAUSEA AND VOMITING THAT IS NOT CONTROLLED WITH YOUR NAUSEA MEDICATION  *UNUSUAL SHORTNESS OF BREATH  *UNUSUAL BRUISING OR BLEEDING  TENDERNESS IN MOUTH AND THROAT WITH OR WITHOUT PRESENCE OF ULCERS  *URINARY PROBLEMS  *BOWEL PROBLEMS  UNUSUAL RASH Items with * indicate a potential emergency and should be followed up as soon as possible.  Feel free to call the clinic you have any questions or concerns. The clinic phone number is [phone].  Please show the CHEMO ALERT CARD at check-in to the Emergency Department and triage nurse.   

## 2017-04-29 ENCOUNTER — Encounter: Payer: Self-pay | Admitting: Hematology

## 2017-04-29 ENCOUNTER — Telehealth: Payer: Self-pay | Admitting: Hematology

## 2017-04-29 NOTE — Telephone Encounter (Signed)
Patient bypassed scheduling on 04/28/17. Appointments scheduled per 04/28/17 los. Patient was mailed an appointment letter and schedule.

## 2017-04-30 ENCOUNTER — Ambulatory Visit (HOSPITAL_BASED_OUTPATIENT_CLINIC_OR_DEPARTMENT_OTHER): Payer: Self-pay

## 2017-04-30 VITALS — BP 108/77 | HR 70 | Temp 97.9°F

## 2017-04-30 DIAGNOSIS — C20 Malignant neoplasm of rectum: Secondary | ICD-10-CM

## 2017-04-30 MED ORDER — HEPARIN SOD (PORK) LOCK FLUSH 100 UNIT/ML IV SOLN
500.0000 [IU] | Freq: Once | INTRAVENOUS | Status: AC | PRN
  Administered 2017-04-30: 500 [IU]
  Filled 2017-04-30: qty 5

## 2017-04-30 MED ORDER — SODIUM CHLORIDE 0.9% FLUSH
10.0000 mL | INTRAVENOUS | Status: DC | PRN
  Administered 2017-04-30: 10 mL
  Filled 2017-04-30: qty 10

## 2017-05-05 ENCOUNTER — Encounter: Payer: Medicaid Other | Admitting: Nutrition

## 2017-05-05 ENCOUNTER — Ambulatory Visit: Payer: Medicaid Other

## 2017-05-05 ENCOUNTER — Other Ambulatory Visit: Payer: Medicaid Other

## 2017-05-06 ENCOUNTER — Encounter: Payer: Self-pay | Admitting: Radiation Oncology

## 2017-05-06 ENCOUNTER — Ambulatory Visit
Admission: RE | Admit: 2017-05-06 | Discharge: 2017-05-06 | Disposition: A | Discharge status: Home / Self Care | Payer: Medicaid Other | Source: Ambulatory Visit | Attending: Radiation Oncology | Admitting: Radiation Oncology

## 2017-05-06 VITALS — BP 115/82 | HR 83 | Temp 97.4°F | Resp 16 | Wt 139.2 lb

## 2017-05-06 DIAGNOSIS — Z9889 Other specified postprocedural states: Secondary | ICD-10-CM | POA: Diagnosis not present

## 2017-05-06 DIAGNOSIS — Z808 Family history of malignant neoplasm of other organs or systems: Secondary | ICD-10-CM | POA: Insufficient documentation

## 2017-05-06 DIAGNOSIS — Z800 Family history of malignant neoplasm of digestive organs: Secondary | ICD-10-CM | POA: Diagnosis not present

## 2017-05-06 DIAGNOSIS — F17210 Nicotine dependence, cigarettes, uncomplicated: Secondary | ICD-10-CM | POA: Diagnosis not present

## 2017-05-06 DIAGNOSIS — Z1509 Genetic susceptibility to other malignant neoplasm: Secondary | ICD-10-CM | POA: Insufficient documentation

## 2017-05-06 DIAGNOSIS — Z833 Family history of diabetes mellitus: Secondary | ICD-10-CM | POA: Diagnosis not present

## 2017-05-06 DIAGNOSIS — Z836 Family history of other diseases of the respiratory system: Secondary | ICD-10-CM | POA: Insufficient documentation

## 2017-05-06 DIAGNOSIS — Z89422 Acquired absence of other left toe(s): Secondary | ICD-10-CM | POA: Insufficient documentation

## 2017-05-06 DIAGNOSIS — Z801 Family history of malignant neoplasm of trachea, bronchus and lung: Secondary | ICD-10-CM | POA: Insufficient documentation

## 2017-05-06 DIAGNOSIS — C20 Malignant neoplasm of rectum: Secondary | ICD-10-CM | POA: Diagnosis present

## 2017-05-06 NOTE — Progress Notes (Signed)
Radiation Oncology         [phone] ________________________________  Name: [name] MRN: [id number]  Date: 05/06/2017  DOB: 07-29-1971  Post Treatment Note  CC: Patient, No Pcp Per  Milus Banister, MD  Diagnosis:   Stage IIB, T3, N2a, M0 adenocarcinoma of the rectum  Interval Since Last Radiation: 5 months  08/17/16-09/24/16 1. Rectum/ 45 Gy in 25 fx 2. Boost/ 5.4 Gy in 3 fx  Narrative:  [name] is a pleasant 46 y.o. male with a history of rectal cancer. He was treated with neoadjuvant radiotherapy and chemo and subsequently underwent a total proctocolectomy with ileoanal J-pouch and diverting loop ileostomy with Dr. Johney Maine on 12/01/16. Final pathology revealed treated disease as [id number] adenocarcinoma of the rectum. He comes today for follow up. He continues on systemic therapy with Dr. Burr Medico on FOLFOX.              On review of systems, the patient reports that he is doing well overall. He is trying to gain back lost weight. He is tolerating his current regimen of treatment and reports that he has an appetite and eats well but that he has a hard time gaining weight. He denies chemo induced nausea/vomiting. He denies any chest pain, shortness of breath, cough, fevers, chills, night sweats, unintended weight changes. He denies any bowel or bladder disturbances, and denies abdominal pain, nausea or vomiting. He denies any new musculoskeletal or joint aches or pains, new skin lesions or concerns. A complete review of systems is obtained and is otherwise negative.   Past Medical History:  Past Medical History:  Diagnosis Date  . Cancer ([place]) 12/01/2016   colon   . Family history of colon cancer   . Poor dental hygiene     Past Surgical History: Past Surgical History:  Procedure Laterality Date  . EUS N/A 07/29/2016   Procedure: LOWER ENDOSCOPIC ULTRASOUND (EUS);  Surgeon: Milus Banister, MD;  Location: [facility];  Service: Endoscopy;  Laterality: N/A;  . PORTACATH  PLACEMENT N/A 01/26/2017   Procedure: PLACEMENT OF PORT-A-CATH CENTRAL LINE WITH FLUOROSCOPY AND ULTRASOUND;  Surgeon: Michael Boston, MD;  Location: [facility];  Service: General;  Laterality: N/A;  . PROCTOSCOPY N/A 12/01/2016   Procedure: RIGID PROCTOSCOPY;  Surgeon: Michael Boston, MD;  Location: [facility];  Service: General;  Laterality: N/A;  . TOE AMPUTATION Left   . XI ROBOTIC ASSISTED LOWER ANTERIOR RESECTION N/A 12/01/2016   Procedure: XI ROBOTIC ASSISTED PROCTOCOLECTOMY WITH ILLEOPOUCH ANASTAMOSIS WITH DIVERTING ILLEOSTOMY;  Surgeon: Michael Boston, MD;  Location: [facility];  Service: General;  Laterality: N/A;    Social History:  Social History   Social History  . Marital status: Married    Spouse name: N/A  . Number of children: 3  . Years of education: N/A   Occupational History  . Not on file.   Social History Main Topics  . Smoking status: Former Smoker    Packs/day: 1.50    Years: 30.00    Types: Cigarettes    Quit date: 12/01/2016  . Smokeless tobacco: Never Used  . Alcohol use No  . Drug use: No     Comment: patient denies  . Sexual activity: Not on file   Other Topics Concern  . Not on file   Social History Narrative   Lives with significant other, [name]   Have a 5 year old son   [place]  The patient is in a relationship but not married.   Family History: Family History  Problem Relation Age of Onset  . Diabetes Mother   . COPD Father   . Colon cancer Maternal Aunt        dx in her 66s  . Diabetes Maternal Grandmother   . Brain cancer Maternal Grandfather   . Lung cancer Paternal Grandfather   . Colon cancer Cousin        dx in her 109s  . Bone cancer Sister 8  . Pancreatic cancer Neg Hx   . Esophageal cancer Neg Hx   . Stomach cancer Neg Hx   . Liver disease Neg Hx     ALLERGIES:  has No Known Allergies.  Meds: Current Outpatient Prescriptions  Medication Sig Dispense Refill  .  diphenoxylate-atropine (LOMOTIL) 2.5-0.025 MG tablet Take 1-2 tablets by mouth 4 (four) times daily as needed for diarrhea or loose stools. 60 tablet 1  . methocarbamol (ROBAXIN) 500 MG tablet Take 1 tablet (500 mg total) by mouth every 6 (six) hours as needed for muscle spasms. 30 tablet 0  . naproxen sodium (ALEVE) 220 MG tablet Take 440 mg by mouth 2 (two) times daily as needed (for pain.).    Marland Kitchen ALPRAZolam (XANAX) 0.5 MG tablet Take 0.5 tablets (0.25 mg total) by mouth at bedtime as needed for anxiety. For anxiety (Patient not taking: Reported on 05/06/2017) 20 tablet 0  . oxyCODONE (OXY IR/ROXICODONE) 5 MG immediate release tablet TAKE 1 TABLET BY MOUTH EVERY 6 HOURS AS NEEDED FOR SEVERE PAIN (Patient not taking: Reported on 05/06/2017) 30 tablet 0  . polyethylene glycol (MIRALAX / GLYCOLAX) packet Take 17 g by mouth daily as needed for moderate constipation.     . prochlorperazine (COMPAZINE) 10 MG tablet Take 1 tablet (10 mg total) by mouth every 6 (six) hours as needed for nausea or vomiting. (Patient not taking: Reported on 05/06/2017) 30 tablet 2  . traMADol (ULTRAM) 50 MG tablet Take 1-2 tablets (50-100 mg total) by mouth every 6 (six) hours as needed for moderate pain or severe pain (for pain.). (Patient not taking: Reported on 05/06/2017) 30 tablet 0   Current Facility-Administered Medications  Medication Dose Route Frequency Provider Last Rate Last Dose  . 0.9 %  sodium chloride infusion  500 mL Intravenous Continuous Doran Stabler, MD        Physical Findings: Wt Readings from Last 3 Encounters:  05/06/17 139 lb 3.2 oz (63.1 kg)  04/28/17 143 lb 14.4 oz (65.3 kg)  04/11/17 144 lb 11.2 oz (65.6 kg)   Temp Readings from Last 3 Encounters:  05/06/17 97.4 F (36.3 C) (Oral)  04/30/17 97.9 F (36.6 C) (Oral)  04/28/17 97.9 F (36.6 C) (Oral)   BP Readings from Last 3 Encounters:  05/06/17 115/82  04/30/17 108/77  04/28/17 113/76   Pulse Readings from Last 3 Encounters:    05/06/17 83  04/30/17 70  04/28/17 77  Pain Assessment Pain Score: 0-No pain/10   In general this is a well appearing gentleman of native [place] heritage in no acute distress. He is alert and oriented x4 and appropriate throughout the examination. HEENT reveals that the patient is normocephalic, atraumatic. EOMs are intact. PERRLA. Skin is intact without any evidence of gross lesions. Cardiovascular exam reveals a regular rate and rhythm, no clicks rubs or murmurs are auscultated. Chest is clear to auscultation bilaterally. Lymphatic assessment is performed and does not reveal any adenopathy in the cervical,  supraclavicular, axillary, or inguinal chains. Abdomen has active bowel sounds in all quadrants and is intact. The abdomen is soft, non tender, non distended with a well healed ileostomy stoma site. Liquid stool is noted in the bag. Lower extremities are negative for pretibial pitting edema, deep calf tenderness, cyanosis or clubbing.    Lab Findings: Lab Results  Component Value Date   WBC 7.5 04/28/2017   HGB 14.5 04/28/2017   HCT 44.2 04/28/2017   MCV 84.1 04/28/2017   PLT 120 (L) 04/28/2017     Radiographic Findings: No results found.  Impression/Plan: 1. Stage IIB, T3, N2a, M0 (pT3N1b) adenocarcinoma of the rectum. The patient will continue his current systemic therapy with FOLFOX under the care of Dr. Burr Medico. We will plan to see him back as needed moving forward. He will call if he has questions or concerns regarding his previous radiotherapy. 2. Lynch Syndrome. The patient will continue screening as outlined per genetics.  3. ?High output ileostomy. It's unclear how much the patient is putting out, and I've asked him to try to measure this at home. I will reach out to Dr. Johney Maine and Dr. Burr Medico as well to see if they have any suggestions regarding need for fluids or other interventions to slow his motility for better absorption.    Carola Rhine, PAC

## 2017-05-06 NOTE — Progress Notes (Signed)
Vitals stable. 4 lb weight loss noted since chemotherapy last Thursday. Reports he continues to take chemotherapy every two weeks on Thursdays. Reports he has energy following chemotherapy until about Monday or Tuesday when he feels run down. Denies supplementing his diet with Ensure, Boost or Glucerna. Reports an excellent appetite. Denies taste changes. Denies nausea or vomiting. Denies abdominal or rectal pain. Reports taking Lomotil to manage diarrhea. Denies urinary leakage, incontinence, dysuria, hematuria, or nocturia.   BP 115/82 (BP Location: Right Arm, Patient Position: Sitting, Cuff Size: Normal)   Pulse 83   Temp 97.4 F (36.3 C) (Oral)   Resp 16   Wt 139 lb 3.2 oz (63.1 kg)   SpO2 100%   BMI 19.41 kg/m  Wt Readings from Last 3 Encounters:  05/06/17 139 lb 3.2 oz (63.1 kg)  04/28/17 143 lb 14.4 oz (65.3 kg)  04/11/17 144 lb 11.2 oz (65.6 kg)

## 2017-05-11 NOTE — Progress Notes (Signed)
[place]  [phone] [phone]  Clinic follow Up Note   Patient Care Team: Patient, No Pcp Per as PCP - General (General Practice) Tania Ade, RN as Registered Nurse Michael Boston, MD as Consulting Physician (General Surgery) [name], Kirke Corin, MD as Consulting Physician (Gastroenterology) Kyung Rudd, MD as Consulting Physician (Radiation Oncology) Truitt Merle, MD as Consulting Physician (Oncology) 05/12/2017  CHIEF COMPLAINTS:  Follow up rectal cancer  Oncology History   Rectal adenocarcinoma [facility])   Staging form: Colon and Rectum, AJCC 7th Edition   - Clinical stage from 07/28/2016: Stage IIIB (T3, N2a, M0) - Signed by Truitt Merle, MD on 08/06/2016      Rectal adenocarcinoma s/p protctocolectomy, IPAA "J" pouch 12/01/2016   07/26/2016 Imaging    CT chest, abdomen and pelvis with contrast showed nodular thickening of the rectal wall, enlarged perirectal lymph nodes. Diverticulosis. Nonspecific low-level lymphadenopathy. Lower lobe emphysema, small 3-4 mm left lower lobe lung nodules.      07/28/2016 Initial Diagnosis    Rectal adenocarcinoma ([place])      07/28/2016 Initial Biopsy    Rectal mass biopsy showed invasive adenocarcinoma, polyps in the ascending colon and rectum showed tubular adenoma       07/28/2016 Procedure    Colonoscopy showed a nearly circumferential mass in the distal rectum, 4 polyps in the descending colon, diverticulosis in the left colon.       07/29/2016 Procedure    Low EUS showed clearly malignant 6 cm long, nearly circumferential mass in the distal rectum, with distal edge located to 1 cm from the annual approach, multiple prior rectal lymph nodes (6) are suspicious ,uT3 N2       08/17/2016 - 09/24/2016 Radiation Therapy    Neoadjuvant radiation to his rectal cancer 1) Rectum/ 45 Gy in 25 fx                      2) Boost/ 5.4 Gy in 3 fx       08/17/2016 - 09/24/2016 Chemotherapy    Xeloda 1500 mg twice daily  with concurrent irradiation      08/18/2016 Imaging    PET scan showed hypermetabolic rectal mass, perirectal node 61m with mild increased uptake, no hypermetabolic distant metastasis, small pulmonary nodules are too small to characterize by PET.       11/29/2016 Imaging    CT of the C/A/P W Contrast IMPRESSION: 1. Interval treatment changes within the perirectal fat with otherwise stable rectosigmoid colon wall thickening. 2. No progressive adenopathy or distant metastases identified. 3. Stable bilateral pulmonary nodules. Short-term stability (for 4 months) is reassuring, although the nodules are too small to optimally characterize by previous PET-CT. Continued CT follow-up recommended.      12/01/2016 Surgery    Colon total resection and proctocolectomy for rectal cancer and Lynch syndrome       12/01/2016 Pathology Results    Proctocolectomy showed scattered small residual foci of invasive moderately differentiated adenocarcinoma embedded in submucosa tissue with extensive neoadjuvant effect, 2.5 cm in greatest time mention, adenocarcinoma invades through muscularis mucosa and involves subserosal soft tissue, margins are negative, 2 of 35 lymph nodes are positive for metastatic adenocarcinoma, 2 calcified and necrotic soft tissue nodules are also present without visible tumor seen.      01/07/2017 -  Chemotherapy    Adjuvant chemo CAPOX, changed to FOLFOX from cycle 2 due to poor tolerance        01/26/2017 Surgery  PLACEMENT OF PORT-A-CATH CENTRAL LINE WITH FLUOROSCOPY AND ULTRASOUND       HISTORY OF PRESENTING ILLNESS:  [name] 46 y.o. male is here because of His newly diagnosed rectal cancer. He is accompanied by his girlfriend to our multidisciplinary GI clinic today.  He has had rectal pain for 6 weeks, and mild rectal bleeding with BM, he goes 4 times a day, very little each time, and pencil shaped stool, he has lost 30 lbs so far. His appettie remains to be  good, he eats well, no cough, nausea, no abd pain. He has a moderate fatigue, he will 2 tolerate his routine activities including his job without much difficulty. He does not have a primary care physician, and has not seen doctors for many years.  He presents to emergency room on 07/26/2016 for the rectal pain and bleeding, and was referred to gastroenterologist Dr. Loletha Carrow. He underwent colonoscopy on 07/28/2016, which showed 4 polyps in the ascending colon, and a circumferential distal rectal mass. Biopsy of the rectal mass showed adenocarcinoma. He underwent EUS by Dr. Ardis Hughs on 07/29/2016 which showed a T3 N2 disease.   CURRENT THERAPY: adjuvant chemo CAPOX, changed to FOLFOX from cycle 2   INTERIM HISTORY  [name] returns for follow-up and cycle 8 chemo. He presents to the clinic today reporting eating good and gaining weight, The chemo is not making him too sick. He recovers in 2 days.. He has tingling in his left hand only.  His stool output is good and he is using Lomotil. He has not needed to take Oxycodone in a while.      MEDICAL HISTORY:  Past Medical History:  Diagnosis Date  . Cancer ([place]) 12/01/2016   colon   . Family history of colon cancer   . Poor dental hygiene        SURGICAL HISTORY: Past Surgical History:  Procedure Laterality Date  . EUS N/A 07/29/2016   Procedure: LOWER ENDOSCOPIC ULTRASOUND (EUS);  Surgeon: Milus Banister, MD;  Location: [facility];  Service: Endoscopy;  Laterality: N/A;  . PORTACATH PLACEMENT N/A 01/26/2017   Procedure: PLACEMENT OF PORT-A-CATH CENTRAL LINE WITH FLUOROSCOPY AND ULTRASOUND;  Surgeon: Michael Boston, MD;  Location: [facility];  Service: General;  Laterality: N/A;  . PROCTOSCOPY N/A 12/01/2016   Procedure: RIGID PROCTOSCOPY;  Surgeon: Michael Boston, MD;  Location: [facility];  Service: General;  Laterality: N/A;  . TOE AMPUTATION Left   . XI ROBOTIC ASSISTED LOWER ANTERIOR RESECTION N/A 12/01/2016   Procedure: XI ROBOTIC  ASSISTED PROCTOCOLECTOMY WITH ILLEOPOUCH ANASTAMOSIS WITH DIVERTING ILLEOSTOMY;  Surgeon: Michael Boston, MD;  Location: [facility];  Service: General;  Laterality: N/A;    SOCIAL HISTORY: Social History   Social History  . Marital status: Married    Spouse name: N/A  . Number of children: 3  . Years of education: N/A   Occupational History  . Not on file.   Social History Main Topics  . Smoking status: Former Smoker    Packs/day: 1.50    Years: 30.00    Types: Cigarettes    Quit date: 12/01/2016  . Smokeless tobacco: Never Used  . Alcohol use No  . Drug use: No     Comment: patient denies  . Sexual activity: Not on file   Other Topics Concern  . Not on file   Social History Narrative   Lives with significant other, [name]   Have a 34 year old son   Smoker  [place]    FAMILY HISTORY: Family History  Problem Relation Age of Onset  . Diabetes Mother   . COPD Father   . Colon cancer Maternal Aunt        dx in her 51s  . Diabetes Maternal Grandmother   . Brain cancer Maternal Grandfather   . Lung cancer Paternal Grandfather   . Colon cancer Cousin        dx in her 71s  . Bone cancer Sister 8  . Pancreatic cancer Neg Hx   . Esophageal cancer Neg Hx   . Stomach cancer Neg Hx   . Liver disease Neg Hx    He lives with his girfriend and their 84 yo son, works for [employer]   ALLERGIES:  has No Known Allergies.  MEDICATIONS:  Current Outpatient Prescriptions  Medication Sig Dispense Refill  . ALPRAZolam (XANAX) 0.5 MG tablet Take 0.5 tablets (0.25 mg total) by mouth at bedtime as needed for anxiety. For anxiety (Patient not taking: Reported on 05/06/2017) 20 tablet 0  . diphenoxylate-atropine (LOMOTIL) 2.5-0.025 MG tablet Take 1-2 tablets by mouth 4 (four) times daily as needed for diarrhea or loose stools. 60 tablet 1  . methocarbamol (ROBAXIN) 500 MG tablet Take 1 tablet (500 mg total) by mouth every 6 (six) hours as needed for muscle  spasms. 30 tablet 0  . naproxen sodium (ALEVE) 220 MG tablet Take 440 mg by mouth 2 (two) times daily as needed (for pain.).    Marland Kitchen oxyCODONE (OXY IR/ROXICODONE) 5 MG immediate release tablet TAKE 1 TABLET BY MOUTH EVERY 6 HOURS AS NEEDED FOR SEVERE PAIN (Patient not taking: Reported on 05/06/2017) 30 tablet 0  . polyethylene glycol (MIRALAX / GLYCOLAX) packet Take 17 g by mouth daily as needed for moderate constipation.     . prochlorperazine (COMPAZINE) 10 MG tablet Take 1 tablet (10 mg total) by mouth every 6 (six) hours as needed for nausea or vomiting. (Patient not taking: Reported on 05/06/2017) 30 tablet 2  . traMADol (ULTRAM) 50 MG tablet Take 1-2 tablets (50-100 mg total) by mouth every 6 (six) hours as needed for moderate pain or severe pain (for pain.). (Patient not taking: Reported on 05/06/2017) 30 tablet 0   Current Facility-Administered Medications  Medication Dose Route Frequency Provider Last Rate Last Dose  . 0.9 %  sodium chloride infusion  500 mL Intravenous Continuous [name], Starr [place] III, MD        REVIEW OF SYSTEMS: Constitutional: Denies fevers, chills or abnormal night sweats (+) appetite good (+) some weight gain Eyes: Denies blurriness of vision, double vision or watery eyes Ears, nose, mouth, throat, and face: Denies mucositis or sore throat Respiratory: Denies cough, dyspnea or wheezes Cardiovascular: Denies palpitation, chest discomfort or lower extremity swelling Gastrointestinal:  Denies heartburn (+) improved diarrhea Skin: Denies abnormal skin rashes Lymphatics: Denies new lymphadenopathy or easy bruising Neurological: (+) cold sensations (+) neuroppsy in just left hand (+) numbness in left lower arm Behavioral/Psych: Mood is stable, no new changes  All other systems were reviewed with the patient and are negative.  PHYSICAL EXAMINATION:  ECOG PERFORMANCE STATUS: 1  Vitals:   05/12/17 1151  BP: 109/80  Pulse: 71  Resp: 20  Temp: 97.8 F (36.6 C)   Filed  Weights   05/12/17 1151  Weight: 141 lb 8 oz (64.2 kg)     GENERAL:alert, no distress and comfortable SKIN: skin color, texture, turgor are normal, no rashes or significant lesions EYES: normal,  conjunctiva are pink and non-injected, sclera clear OROPHARYNX:no exudate, no erythema and lips, buccal mucosa, and tongue normal  NECK: supple, thyroid normal size, non-tender, without nodularity LYMPH:  no palpable lymphadenopathy in the cervical, axillary or inguinal LUNGS: clear to auscultation and percussion with normal breathing effort HEART: regular rate & rhythm and no murmurs and no lower extremity edema ABDOMEN:abdomen soft, non-tender and normal bowel sounds. (+) coleostomy bag with red watery substance in the right abdomen, surgical incision sites has healed well. Musculoskeletal:no cyanosis of digits and no clubbing  PSYCH: alert & oriented x 3 with fluent speech NEURO: no focal motor/sensory deficits  LABORATORY DATA:  I have reviewed the data as listed CBC Latest Ref Rng & Units 05/12/2017 04/28/2017 04/11/2017  WBC 4.0 - 10.3 10e3/uL 5.8 7.5 5.5  Hemoglobin 13.0 - 17.1 g/dL 14.8 14.5 14.5  Hematocrit 38.4 - 49.9 % 45.0 44.2 44.8  Platelets 140 - 400 10e3/uL 110(L) 120(L) 182   CMP Latest Ref Rng & Units 05/12/2017 04/28/2017 04/11/2017  Glucose 70 - 140 mg/dl 107 177(H) 108  BUN 7.0 - 26.0 mg/dL 6.4(L) 6.7(L) 8.9  Creatinine 0.7 - 1.3 mg/dL 0.9 0.8 0.9  Sodium 136 - 145 mEq/L 140 139 140  Potassium 3.5 - 5.1 mEq/L 4.4 3.7 4.3  Chloride 101 - 111 mmol/L - - -  CO2 22 - 29 mEq/L 20(L) 21(L) 21(L)  Calcium 8.4 - 10.4 mg/dL 8.5 8.5 8.3(L)  Total Protein 6.4 - 8.3 g/dL 5.1(L) 5.1(L) 4.9(L)  Total Bilirubin 0.20 - 1.20 mg/dL 0.33 0.40 <0.22  Alkaline Phos 40 - 150 U/L 67 62 59  AST 5 - 34 U/L 37(H) 27 31  ALT 0 - 55 U/L '28 20 18   '$ Pathology report  Diagnosis 07/28/2016 1. Surgical [P], descending, polyps(2) -TUBULAR ADENOMAS. -NO HIGH GRADE DYSPLASIA OR MALIGNANCY  IDENTIFIED. 2. Surgical [P], rectal mass -INVASIVE ADENOCARCINOMA. -SEE COMMENT. 3. Surgical [P], rectum, polyps(2) -TUBULAR ADENOMAS. -NO HIGH GRADE DYSPLASIA OR MALIGNANCY IDENTIFIED.    Diagnosis 12/01/2016 1. Colon, total resection (incl lymph nodes), proctocolectomy - SCATTERED SMALL RESIDUAL FOCI (EACH LESS THAN 0.3 CM) OF INVASIVE MODERATELY DIFFERENTIATED ADENOCARCINOMA EMBEDDED IN SUBMUCOSAL TISSUE WITH EXTENSIVE NEOADJUVANT EFFECT. - TUMOR IS PRESENT WITHIN A REGION MEASURING APPROXIMATELY 2.5 CM IN GREATEST DIMENSION. - ADENOCARCINOMA INVADES THROUGH MUSCULARIS MUCOSA AND INVOLVES SUBSEROSAL SOFT TISSUE. - MARGINS ARE NEGATIVE. - TWO OF THIRTY-FIVE LYMPH NODES ARE POSITIVE FOR METASTATIC ADENOCARCINOMA (2/35). - LYMPH NODES ALSO DEMONSTRATE VARIABLE DEGREES OF NEOADJUVANT EFFECT. - TWO CALCIFIED AND NECROTIC SOFT TISSUE NODULES ARE ALSO PRESENT WITHOUT VIABLE TUMOR SEEN. - SEE ONCOLOGY TEMPLATE. 2. Colon, resection margin (donut), distal ring final distal margin - BENIGN ANAL MUCOSA. - NO DYSPLASIA OR TUMOR SEEN.  RADIOGRAPHIC STUDIES: I have personally reviewed the radiological images as listed and agreed with the findings in the report.  EUS 07/29/2016 -Clearly malignant 6cm long, nearly circumferential mass in the distal rectum with distal edge located 1cm from the anal verge. If this is rectal adenocarcinoma it is uT3N2a, Stage IIIa.  COLONOSCOPY 07/28/2016 - Rectal mass. - Four 4 mm polyps in the rectum and in the descending colon, removed with a cold snare. Resected and retrieved. - Diverticulosis in the left colon. - Likely malignant tumor in the distal rectum. Biopsied. Tattooed.  CT of the chest/abd/pelvis with contrast 11/29/2016 IMPRESSION: 1. Interval treatment changes within the perirectal fat with otherwise stable rectosigmoid colon wall thickening. 2. No progressive adenopathy or distant metastases identified. 3. Stable bilateral pulmonary nodules.  Short-term  stability (for 4 months) is reassuring, although the nodules are too small to optimally characterize by previous PET-CT. Continued CT follow-up recommended.   ASSESSMENT & PLAN: 46 y.o. male without significant past medical history presented with rectal pain and bleeding.  1. 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 He also has a slightly prominent periaortic lymph node, about 8 mm. -I previously reviewed his PET CT scan findings with patient and his girl firend, the small lung nodules and periaortic lymph node are not hypermetabolic, no definitive evidence of metastasis on the PET scan. -He previously completed neoadjuvant chemoradiation, tolerated moderate well overall.  -I previously reviewed his surgical pathology findings in great details. He had computed surgical resection, but unfortunately still has significant residual disease including 2 positive lymph nodes. -I previously recommended him to have adjuvant chemotherapy to reduce his risk of recurrence. Due to the positive lymph nodes, I recommend FOLFOX or CAPOX for 4.5 months  -The goal of therapy is curative. However his lung nodules are suspicious for metastatic disease, needs to be followed closely. If he does have lung metastasis, then his cancer is unlikely curable. -He did poorly with the first cycle CAPOX, chemo changed to FOLFOX from cycle 2 -He is tolerating chemotherapy well, weight is stable, diarrhea improved, he does not require a regular IV fluids lately.  -Lab previously reviewed, mild from cytopenia, adequate for treatment, we'll continue  chemotherapy today -We will repeat staging scan after done with chemo - Will see patient after every other treatment since he is doing well -Labs reviewed and adequate to continue cycle 8 today   2. Diarrhea  -He has watery stool after total colectomy, his ileostomy output has significant increases since he started chemotherapy -I encouraged him to continue using Imodium and Lomotil.  -Hysteria has much improved with Lomotil, he will continue -I have previously set him up for IV fluids 2-3 times a week, will cut down once a week now  - Diarrhea has improved previously   3. 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 -He has undergone total colectomy and proctocolectomy, his risk of secondary colon cancer is minimal -We previously discussed other cancer risks from age syndrome, I previously recommend him to have EGD every 3-5 years, annual urinalysis for GU cancer screening.   4. Anxiety  -He previously felt nervous and anxious since his cancer diagnosis, much improved after starting treatment  -Continue Xanax as needed  5. Social issues -He is out of work, he wants to apply for Medicaid and disability  -He was seen by our Education officer, museum  6. Headache and pain at ileostomy bag -I previously encouraged him to use Tylenol for headaches.  -He has stated his pain is unbearable sometimes. He is taking oxycodone. 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 - Patient is no longer taking Oxycodone for pain   Plan  --Lab reviewed, adequate for treatment, we'll proceed to cycle 8 FOLFOX today  -Lab, flush, and chemo FOLFOX in  2 and 4 weeks -f/u in 4 weeks  All questions were answered. The patient knows to call the clinic with any problems, questions or  concerns. I spent 20 minutes counseling the patient face to face. The total time spent in the appointment was 25 minutes and more than 50% was on counseling.  I have reviewed the above documentation for accuracy and completeness, and I agree with the above information.   This document serves as a record of services personally performed by Truitt Merle, MD. It was created on her behalf by Joslyn Devon, a trained medical scribe. The creation of this record is based on the scribe's personal observations and the provider's statements to them. This document has been checked and approved by the attending provider.     Truitt Merle, MD 05/12/2017

## 2017-05-12 ENCOUNTER — Ambulatory Visit (HOSPITAL_BASED_OUTPATIENT_CLINIC_OR_DEPARTMENT_OTHER): Payer: Medicaid Other | Admitting: Hematology

## 2017-05-12 ENCOUNTER — Ambulatory Visit (HOSPITAL_BASED_OUTPATIENT_CLINIC_OR_DEPARTMENT_OTHER): Payer: Medicaid Other

## 2017-05-12 ENCOUNTER — Ambulatory Visit: Payer: Medicaid Other

## 2017-05-12 ENCOUNTER — Other Ambulatory Visit (HOSPITAL_BASED_OUTPATIENT_CLINIC_OR_DEPARTMENT_OTHER): Payer: Medicaid Other

## 2017-05-12 VITALS — BP 112/70 | HR 57 | Temp 98.1°F | Resp 17

## 2017-05-12 VITALS — BP 109/80 | HR 71 | Temp 97.8°F | Resp 20 | Ht 71.0 in | Wt 141.5 lb

## 2017-05-12 DIAGNOSIS — Z1509 Genetic susceptibility to other malignant neoplasm: Secondary | ICD-10-CM | POA: Diagnosis not present

## 2017-05-12 DIAGNOSIS — C20 Malignant neoplasm of rectum: Secondary | ICD-10-CM

## 2017-05-12 DIAGNOSIS — Z5111 Encounter for antineoplastic chemotherapy: Secondary | ICD-10-CM | POA: Diagnosis not present

## 2017-05-12 DIAGNOSIS — F418 Other specified anxiety disorders: Secondary | ICD-10-CM | POA: Diagnosis not present

## 2017-05-12 DIAGNOSIS — R51 Headache: Secondary | ICD-10-CM

## 2017-05-12 DIAGNOSIS — R197 Diarrhea, unspecified: Secondary | ICD-10-CM | POA: Diagnosis not present

## 2017-05-12 LAB — COMPREHENSIVE METABOLIC PANEL
ALK PHOS: 67 U/L (ref 40–150)
ALT: 28 U/L (ref 0–55)
AST: 37 U/L — AB (ref 5–34)
Albumin: 2.7 g/dL — ABNORMAL LOW (ref 3.5–5.0)
Anion Gap: 5 mEq/L (ref 3–11)
BUN: 6.4 mg/dL — AB (ref 7.0–26.0)
CALCIUM: 8.5 mg/dL (ref 8.4–10.4)
CHLORIDE: 115 meq/L — AB (ref 98–109)
CO2: 20 mEq/L — ABNORMAL LOW (ref 22–29)
CREATININE: 0.9 mg/dL (ref 0.7–1.3)
EGFR: >90 mL/min/{1.73_m2} (ref >90)
Glucose: 107 mg/dl (ref 70–140)
POTASSIUM: 4.4 meq/L (ref 3.5–5.1)
Sodium: 140 mEq/L (ref 136–145)
Total Bilirubin: 0.33 mg/dL (ref 0.20–1.20)
Total Protein: 5.1 g/dL — ABNORMAL LOW (ref 6.4–8.3)

## 2017-05-12 LAB — CBC WITH DIFFERENTIAL/PLATELET
BASO%: 0.7 % (ref 0.0–2.0)
BASOS ABS: 0 10*3/uL (ref 0.0–0.1)
EOS ABS: 0 10*3/uL (ref 0.0–0.5)
EOS%: 0.8 % (ref 0.0–7.0)
HEMATOCRIT: 45 % (ref 38.4–49.9)
HEMOGLOBIN: 14.8 g/dL (ref 13.0–17.1)
LYMPH#: 0.5 10*3/uL — AB (ref 0.9–3.3)
LYMPH%: 9.3 % — ABNORMAL LOW (ref 14.0–49.0)
MCH: 28 pg (ref 27.2–33.4)
MCHC: 32.9 g/dL (ref 32.0–36.0)
MCV: 84.9 fL (ref 79.3–98.0)
MONO#: 0.6 10*3/uL (ref 0.1–0.9)
MONO%: 11.2 % (ref 0.0–14.0)
NEUT#: 4.5 10*3/uL (ref 1.5–6.5)
NEUT%: 78 % — AB (ref 39.0–75.0)
PLATELETS: 110 10*3/uL — AB (ref 140–400)
RBC: 5.3 10*6/uL (ref 4.20–5.82)
RDW: 20.5 % — AB (ref 11.0–14.6)
WBC: 5.8 10*3/uL (ref 4.0–10.3)

## 2017-05-12 MED ORDER — PALONOSETRON HCL INJECTION 0.25 MG/5ML
0.2500 mg | Freq: Once | INTRAVENOUS | Status: AC
  Administered 2017-05-12: 0.25 mg via INTRAVENOUS

## 2017-05-12 MED ORDER — LEUCOVORIN CALCIUM INJECTION 350 MG
400.0000 mg/m2 | Freq: Once | INTRAVENOUS | Status: AC
  Administered 2017-05-12: 728 mg via INTRAVENOUS
  Filled 2017-05-12: qty 36.4

## 2017-05-12 MED ORDER — OXALIPLATIN CHEMO INJECTION 100 MG/20ML
83.0000 mg/m2 | Freq: Once | INTRAVENOUS | Status: AC
  Administered 2017-05-12: 150 mg via INTRAVENOUS
  Filled 2017-05-12: qty 20

## 2017-05-12 MED ORDER — DEXAMETHASONE SODIUM PHOSPHATE 10 MG/ML IJ SOLN
INTRAMUSCULAR | Status: AC
  Filled 2017-05-12: qty 1

## 2017-05-12 MED ORDER — DEXTROSE 5 % IV SOLN
Freq: Once | INTRAVENOUS | Status: AC
Start: 2017-05-12 — End: 2017-05-12
  Administered 2017-05-12: 13:00:00 via INTRAVENOUS

## 2017-05-12 MED ORDER — PALONOSETRON HCL INJECTION 0.25 MG/5ML
INTRAVENOUS | Status: AC
  Filled 2017-05-12: qty 5

## 2017-05-12 MED ORDER — SODIUM CHLORIDE 0.9 % IV SOLN
2400.0000 mg/m2 | INTRAVENOUS | Status: DC
  Administered 2017-05-12: 4350 mg via INTRAVENOUS
  Filled 2017-05-12: qty 87

## 2017-05-12 MED ORDER — SODIUM CHLORIDE 0.9% FLUSH
10.0000 mL | INTRAVENOUS | Status: DC | PRN
  Administered 2017-05-12: 10 mL
  Filled 2017-05-12: qty 10

## 2017-05-12 MED ORDER — DEXAMETHASONE SODIUM PHOSPHATE 10 MG/ML IJ SOLN
10.0000 mg | Freq: Once | INTRAMUSCULAR | Status: AC
  Administered 2017-05-12: 10 mg via INTRAVENOUS

## 2017-05-12 NOTE — Addendum Note (Signed)
Encounter addended by: Malena Edman, RN on: 05/12/2017  8:36 AM<BR>    Actions taken: Charge Capture section accepted

## 2017-05-12 NOTE — Patient Instructions (Signed)
[facility] Discharge Instructions for Patients Receiving Chemotherapy  Today you received the following chemotherapy agents:  Oxaliplatin, Leucovorin, Fluorouracil  To help prevent nausea and vomiting after your treatment, we encourage you to take your nausea medication as prescribed.   If you develop nausea and vomiting that is not controlled by your nausea medication, call the clinic.   BELOW ARE SYMPTOMS THAT SHOULD BE REPORTED IMMEDIATELY:  *FEVER GREATER THAN 100.5 F  *CHILLS WITH OR WITHOUT FEVER  NAUSEA AND VOMITING THAT IS NOT CONTROLLED WITH YOUR NAUSEA MEDICATION  *UNUSUAL SHORTNESS OF BREATH  *UNUSUAL BRUISING OR BLEEDING  TENDERNESS IN MOUTH AND THROAT WITH OR WITHOUT PRESENCE OF ULCERS  *URINARY PROBLEMS  *BOWEL PROBLEMS  UNUSUAL RASH Items with * indicate a potential emergency and should be followed up as soon as possible.  Feel free to call the clinic you have any questions or concerns. The clinic phone number is [phone].  Please show the CHEMO ALERT CARD at check-in to the Emergency Department and triage nurse.   

## 2017-05-14 ENCOUNTER — Ambulatory Visit (HOSPITAL_BASED_OUTPATIENT_CLINIC_OR_DEPARTMENT_OTHER): Payer: Self-pay

## 2017-05-14 ENCOUNTER — Encounter: Payer: Self-pay | Admitting: Hematology

## 2017-05-14 VITALS — BP 107/69 | HR 62 | Temp 97.9°F | Resp 17

## 2017-05-14 DIAGNOSIS — C20 Malignant neoplasm of rectum: Secondary | ICD-10-CM

## 2017-05-14 MED ORDER — SODIUM CHLORIDE 0.9% FLUSH
10.0000 mL | INTRAVENOUS | Status: DC | PRN
  Administered 2017-05-14: 10 mL
  Filled 2017-05-14: qty 10

## 2017-05-14 MED ORDER — HEPARIN SOD (PORK) LOCK FLUSH 100 UNIT/ML IV SOLN
500.0000 [IU] | Freq: Once | INTRAVENOUS | Status: AC | PRN
  Administered 2017-05-14: 500 [IU]
  Filled 2017-05-14: qty 5

## 2017-05-17 ENCOUNTER — Ambulatory Visit: Payer: Medicaid Other | Admitting: Radiation Oncology

## 2017-05-25 NOTE — Progress Notes (Signed)
[name]  [phone] [phone]  Clinic follow Up Note   Patient Care Team: Patient, No Pcp Per as PCP - General (General Practice) Tania Ade, RN as Registered Nurse Michael Boston, MD as Consulting Physician (General Surgery) [name], Kirke Corin, MD as Consulting Physician (Gastroenterology) Kyung Rudd, MD as Consulting Physician (Radiation Oncology) Truitt Merle, MD as Consulting Physician (Oncology) 05/26/2017  CHIEF COMPLAINTS:  Follow up rectal cancer  Oncology History   Rectal adenocarcinoma [facility])   Staging form: Colon and Rectum, AJCC 7th Edition   - Clinical stage from 07/28/2016: Stage IIIB (T3, N2a, M0) - Signed by Truitt Merle, MD on 08/06/2016      Rectal adenocarcinoma s/p protctocolectomy, IPAA "J" pouch 12/01/2016   07/26/2016 Imaging    CT chest, abdomen and pelvis with contrast showed nodular thickening of the rectal wall, enlarged perirectal lymph nodes. Diverticulosis. Nonspecific low-level lymphadenopathy. Lower lobe emphysema, small 3-4 mm left lower lobe lung nodules.      07/28/2016 Initial Diagnosis    Rectal adenocarcinoma ([place])      07/28/2016 Initial Biopsy    Rectal mass biopsy showed invasive adenocarcinoma, polyps in the ascending colon and rectum showed tubular adenoma       07/28/2016 Procedure    Colonoscopy showed a nearly circumferential mass in the distal rectum, 4 polyps in the descending colon, diverticulosis in the left colon.       07/29/2016 Procedure    Low EUS showed clearly malignant 6 cm long, nearly circumferential mass in the distal rectum, with distal edge located to 1 cm from the annual approach, multiple prior rectal lymph nodes (6) are suspicious ,uT3 N2       08/17/2016 - 09/24/2016 Radiation Therapy    Neoadjuvant radiation to his rectal cancer 1) Rectum/ 45 Gy in 25 fx                      2) Boost/ 5.4 Gy in 3 fx       08/17/2016 - 09/24/2016 Chemotherapy    Xeloda 1500 mg twice daily  with concurrent irradiation      08/18/2016 Imaging    PET scan showed hypermetabolic rectal mass, perirectal node 53m with mild increased uptake, no hypermetabolic distant metastasis, small pulmonary nodules are too small to characterize by PET.       11/29/2016 Imaging    CT of the C/A/P W Contrast IMPRESSION: 1. Interval treatment changes within the perirectal fat with otherwise stable rectosigmoid colon wall thickening. 2. No progressive adenopathy or distant metastases identified. 3. Stable bilateral pulmonary nodules. Short-term stability (for 4 months) is reassuring, although the nodules are too small to optimally characterize by previous PET-CT. Continued CT follow-up recommended.      12/01/2016 Surgery    Colon total resection and proctocolectomy for rectal cancer and Lynch syndrome       12/01/2016 Pathology Results    Proctocolectomy showed scattered small residual foci of invasive moderately differentiated adenocarcinoma embedded in submucosa tissue with extensive neoadjuvant effect, 2.5 cm in greatest time mention, adenocarcinoma invades through muscularis mucosa and involves subserosal soft tissue, margins are negative, 2 of 35 lymph nodes are positive for metastatic adenocarcinoma, 2 calcified and necrotic soft tissue nodules are also present without visible tumor seen.      01/07/2017 -  Chemotherapy    Adjuvant chemo CAPOX, changed to FOLFOX from cycle 2 due to poor tolerance        01/26/2017 Surgery  PLACEMENT OF PORT-A-CATH CENTRAL LINE WITH FLUOROSCOPY AND ULTRASOUND       HISTORY OF PRESENTING ILLNESS:  [name] 46 y.o. male is here because of His newly diagnosed rectal cancer. He is accompanied by his girlfriend to our multidisciplinary GI clinic today.  He has had rectal pain for 6 weeks, and mild rectal bleeding with BM, he goes 4 times a day, very little each time, and pencil shaped stool, he has lost 30 lbs so far. His appettie remains to be  good, he eats well, no cough, nausea, no abd pain. He has a moderate fatigue, he will 2 tolerate his routine activities including his job without much difficulty. He does not have a primary care physician, and has not seen doctors for many years.  He presents to emergency room on 07/26/2016 for the rectal pain and bleeding, and was referred to gastroenterologist Dr. Loletha Carrow. He underwent colonoscopy on 07/28/2016, which showed 4 polyps in the ascending colon, and a circumferential distal rectal mass. Biopsy of the rectal mass showed adenocarcinoma. He underwent EUS by Dr. Ardis Hughs on 07/29/2016 which showed a T3 N2 disease.   CURRENT THERAPY: adjuvant chemo CAPOX, changed to FOLFOX from cycle 2   INTERIM HISTORY  [name] returns for follow-up and cycle 9 chemo.He has been doing well. He denies any trouble with treatment besides tingling in his left hand. He is still able to write and pick up small things. He drinks enough water and has no problem with his appetite.     MEDICAL HISTORY:  Past Medical History:  Diagnosis Date  . Cancer ([place]) 12/01/2016   colon   . Family history of colon cancer   . Poor dental hygiene        SURGICAL HISTORY: Past Surgical History:  Procedure Laterality Date  . EUS N/A 07/29/2016   Procedure: LOWER ENDOSCOPIC ULTRASOUND (EUS);  Surgeon: Milus Banister, MD;  Location: [facility];  Service: Endoscopy;  Laterality: N/A;  . PORTACATH PLACEMENT N/A 01/26/2017   Procedure: PLACEMENT OF PORT-A-CATH CENTRAL LINE WITH FLUOROSCOPY AND ULTRASOUND;  Surgeon: Michael Boston, MD;  Location: [place];  Service: General;  Laterality: N/A;  . PROCTOSCOPY N/A 12/01/2016   Procedure: RIGID PROCTOSCOPY;  Surgeon: Michael Boston, MD;  Location: [facility];  Service: General;  Laterality: N/A;  . TOE AMPUTATION Left   . XI ROBOTIC ASSISTED LOWER ANTERIOR RESECTION N/A 12/01/2016   Procedure: XI ROBOTIC ASSISTED PROCTOCOLECTOMY WITH ILLEOPOUCH ANASTAMOSIS WITH DIVERTING  ILLEOSTOMY;  Surgeon: Michael Boston, MD;  Location: [facility];  Service: General;  Laterality: N/A;    SOCIAL HISTORY: Social History   Social History  . Marital status: Married    Spouse name: N/A  . Number of children: 3  . Years of education: N/A   Occupational History  . Not on file.   Social History Main Topics  . Smoking status: Former Smoker    Packs/day: 1.50    Years: 30.00    Types: Cigarettes    Quit date: 12/01/2016  . Smokeless tobacco: Never Used  . Alcohol use No  . Drug use: No     Comment: patient denies  . Sexual activity: Not on file   Other Topics Concern  . Not on file   Social History Narrative   Lives with significant other, [name]   Have a 78 year old son   Smoker      [place] HISTORY: Family History  Problem Relation Age of Onset  . Diabetes Mother   . COPD Father   . Colon cancer Maternal Aunt        dx in her 61s  . Diabetes Maternal Grandmother   . Brain cancer Maternal Grandfather   . Lung cancer Paternal Grandfather   . Colon cancer Cousin        dx in her 41s  . Bone cancer Sister 8  . Pancreatic cancer Neg Hx   . Esophageal cancer Neg Hx   . Stomach cancer Neg Hx   . Liver disease Neg Hx    He lives with his girfriend and their 43 yo son, works for [employer]   ALLERGIES:  has No Known Allergies.  MEDICATIONS:  Current Outpatient Prescriptions  Medication Sig Dispense Refill  . ALPRAZolam (XANAX) 0.5 MG tablet Take 0.5 tablets (0.25 mg total) by mouth at bedtime as needed for anxiety. For anxiety 20 tablet 0  . diphenoxylate-atropine (LOMOTIL) 2.5-0.025 MG tablet Take 1-2 tablets by mouth 4 (four) times daily as needed for diarrhea or loose stools. 60 tablet 1  . methocarbamol (ROBAXIN) 500 MG tablet Take 1 tablet (500 mg total) by mouth every 6 (six) hours as needed for muscle spasms. 30 tablet 0  . naproxen sodium (ALEVE) 220 MG tablet Take 440 mg by mouth 2 (two) times daily as needed (for  pain.).    Marland Kitchen polyethylene glycol (MIRALAX / GLYCOLAX) packet Take 17 g by mouth daily as needed for moderate constipation.     . traMADol (ULTRAM) 50 MG tablet Take 1-2 tablets (50-100 mg total) by mouth every 6 (six) hours as needed for moderate pain or severe pain (for pain.). 30 tablet 0  . prochlorperazine (COMPAZINE) 10 MG tablet Take 1 tablet (10 mg total) by mouth every 6 (six) hours as needed for nausea or vomiting. (Patient not taking: Reported on 05/06/2017) 30 tablet 2   Current Facility-Administered Medications  Medication Dose Route Frequency Provider Last Rate Last Dose  . 0.9 %  sodium chloride infusion  500 mL Intravenous Continuous [name], Estill Cotta III, MD        REVIEW OF SYSTEMS:  Constitutional: Denies fevers, chills or abnormal night sweats (+) appetite good (+) lost 4 pounds Eyes: Denies blurriness of vision, double vision or watery eyes Ears, nose, mouth, throat, and face: Denies mucositis or sore throat Respiratory: Denies cough, dyspnea or wheezes Cardiovascular: Denies palpitation, chest discomfort or lower extremity swelling Gastrointestinal:  Denies heartburn (+) improved diarrhea Skin: Denies abnormal skin rashes Lymphatics: Denies new lymphadenopathy or easy bruising Neurological: (+) cold sensations (+) neuroppsy in just left hand (+) numbness in left lower arm Behavioral/Psych: Mood is stable, no new changes  All other systems were reviewed with the patient and are negative.  PHYSICAL EXAMINATION:  ECOG PERFORMANCE STATUS: 1  Vitals:   05/26/17 1115  BP: 109/76  Pulse: 71  Resp: 18  Temp: 98.1 F (36.7 C)   Filed Weights   05/26/17 1115  Weight: 137 lb 3.2 oz (62.2 kg)     GENERAL:alert, no distress and comfortable SKIN: skin color, texture, turgor are normal, no rashes or significant lesions EYES: normal, conjunctiva are pink and non-injected, sclera clear OROPHARYNX:no exudate, no erythema and lips, buccal mucosa, and tongue normal  NECK:  supple, thyroid normal size, non-tender, without nodularity LYMPH:  no palpable lymphadenopathy in the cervical, axillary or inguinal LUNGS: clear to auscultation and percussion with normal breathing effort HEART: regular rate & rhythm and no  murmurs and no lower extremity edema ABDOMEN:abdomen soft, non-tender and normal bowel sounds. (+) coleostomy bag with red watery substance in the right abdomen, surgical incision sites has healed well. Musculoskeletal:no cyanosis of digits and no clubbing  PSYCH: alert & oriented x 3 with fluent speech NEURO: no focal motor/sensory deficits  LABORATORY DATA:  I have reviewed the data as listed CBC Latest Ref Rng & Units 05/26/2017 05/12/2017 04/28/2017  WBC 4.0 - 10.3 10e3/uL 5.7 5.8 7.5  Hemoglobin 13.0 - 17.1 g/dL 69.2 23.0 09.7  Hematocrit 38.4 - 49.9 % 46.6 45.0 44.2  Platelets 140 - 400 10e3/uL 111(L) 110(L) 120(L)   CMP Latest Ref Rng & Units 05/26/2017 05/12/2017 04/28/2017  Glucose 70 - 140 mg/dl 949(N) 718 209(H)  BUN 7.0 - 26.0 mg/dL 06.8 6.4(L) 6.7(L)  Creatinine 0.7 - 1.3 mg/dL 0.9 0.9 0.8  Sodium 934 - 145 mEq/L 137 140 139  Potassium 3.5 - 5.1 mEq/L 3.9 4.4 3.7  Chloride 101 - 111 mmol/L - - -  CO2 22 - 29 mEq/L 19(L) 20(L) 21(L)  Calcium 8.4 - 10.4 mg/dL 8.8 8.5 8.5  Total Protein 6.4 - 8.3 g/dL 0.6(E) 5.1(L) 5.1(L)  Total Bilirubin 0.20 - 1.20 mg/dL 4.03 3.53 3.17  Alkaline Phos 40 - 150 U/L 76 67 62  AST 5 - 34 U/L 48(H) 37(H) 27  ALT 0 - 55 U/L 36 28 20   Pathology report  Diagnosis 07/28/2016 1. Surgical [P], descending, polyps(2) -TUBULAR ADENOMAS. -NO HIGH GRADE DYSPLASIA OR MALIGNANCY IDENTIFIED. 2. Surgical [P], rectal mass -INVASIVE ADENOCARCINOMA. -SEE COMMENT. 3. Surgical [P], rectum, polyps(2) -TUBULAR ADENOMAS. -NO HIGH GRADE DYSPLASIA OR MALIGNANCY IDENTIFIED.    Diagnosis 12/01/2016 1. Colon, total resection (incl lymph nodes), proctocolectomy - SCATTERED SMALL RESIDUAL FOCI (EACH LESS THAN 0.3 CM) OF INVASIVE  MODERATELY DIFFERENTIATED ADENOCARCINOMA EMBEDDED IN SUBMUCOSAL TISSUE WITH EXTENSIVE NEOADJUVANT EFFECT. - TUMOR IS PRESENT WITHIN A REGION MEASURING APPROXIMATELY 2.5 CM IN GREATEST DIMENSION. - ADENOCARCINOMA INVADES THROUGH MUSCULARIS MUCOSA AND INVOLVES SUBSEROSAL SOFT TISSUE. - MARGINS ARE NEGATIVE. - TWO OF THIRTY-FIVE LYMPH NODES ARE POSITIVE FOR METASTATIC ADENOCARCINOMA (2/35). - LYMPH NODES ALSO DEMONSTRATE VARIABLE DEGREES OF NEOADJUVANT EFFECT. - TWO CALCIFIED AND NECROTIC SOFT TISSUE NODULES ARE ALSO PRESENT WITHOUT VIABLE TUMOR SEEN. - SEE ONCOLOGY TEMPLATE. 2. Colon, resection margin (donut), distal ring final distal margin - BENIGN ANAL MUCOSA. - NO DYSPLASIA OR TUMOR SEEN.  RADIOGRAPHIC STUDIES: I have personally reviewed the radiological images as listed and agreed with the findings in the report.  EUS 07/29/2016 -Clearly malignant 6cm long, nearly circumferential mass in the distal rectum with distal edge located 1cm from the anal verge. If this is rectal adenocarcinoma it is uT3N2a, Stage IIIa.  COLONOSCOPY 07/28/2016 - Rectal mass. - Four 4 mm polyps in the rectum and in the descending colon, removed with a cold snare. Resected and retrieved. - Diverticulosis in the left colon. - Likely malignant tumor in the distal rectum. Biopsied. Tattooed.  CT of the chest/abd/pelvis with contrast 11/29/2016 IMPRESSION: 1. Interval treatment changes within the perirectal fat with otherwise stable rectosigmoid colon wall thickening. 2. No progressive adenopathy or distant metastases identified. 3. Stable bilateral pulmonary nodules. Short-term stability (for 4 months) is reassuring, although the nodules are too small to optimally characterize by previous PET-CT. Continued CT follow-up recommended.   ASSESSMENT & PLAN: 46 y.o. male without significant past medical history presented with rectal pain and bleeding.  1. 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 He also has a slightly prominent periaortic lymph node, about 8 mm. -I previously reviewed his PET CT scan findings with patient and his girl firend, the small lung nodules and periaortic lymph node are not hypermetabolic, no definitive evidence of metastasis on the PET scan. -He previously completed neoadjuvant chemoradiation, tolerated moderate well overall.  -I previously reviewed his surgical pathology findings in great details. He had computed surgical resection, but unfortunately still has significant residual disease including 2 positive lymph nodes. -I previously recommended him to have adjuvant chemotherapy to reduce his risk of recurrence. Due to the positive lymph nodes, I recommend FOLFOX or CAPOX for 4.5 months  -The goal of therapy is curative. However his lung nodules are suspicious for metastatic disease, needs to be followed closely. If he does have lung metastasis, then his cancer is unlikely curable. -He did poorly with the first cycle CAPOX, chemo changed to FOLFOX from cycle 2 -He is tolerating chemotherapy well, weight is stable, diarrhea improved, he does not require IV fluids lately.  -We will repeat staging scan after done with chemo -Labs reviewed and adequate. We completed cycle 9 - I will reduce chemo treatment by 15% on 6/7 due to his neuropathy   2. Diarrhea  -He has watery stool after total colectomy, his ileostomy output has significant increases since he started chemotherapy -I encouraged him to continue using Imodium and Lomotil.  -Hysteria has much improved with Lomotil, he will continue -I have  previously set him up for IV fluids 2-3 times a week, off now  - Diarrhea has improved overall  3. 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 -He has undergone total colectomy and proctocolectomy, his risk of secondary colon cancer is minimal -We previously discussed other cancer risks from age syndrome, I previously recommend him to have EGD every 3-5 years, annual urinalysis for GU cancer screening.   4. Anxiety  -He previously felt nervous and anxious since his cancer diagnosis, much improved after starting treatment  -Continue Xanax as needed  5. Social issues -He is out of work, he wants to apply for Medicaid and disability  -He was seen by our Child psychotherapist  6. Headache and pain at ileostomy bag -I previously encouraged him to use Tylenol for headaches.  -He has stated his pain is unbearable sometimes. He is taking oxycodone. 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 - Patient is no longer taking Oxycodone for pain   Plan  --Lab reviewed, adequate for treatment, we'll proceed to cycle 9 FOLFOX today. I will reduce oxaliplatin dose by 15% today due to his neuropathy -Lab, flush, and chemo FOLFOX in 2 and 4 weeks -f/u in 4 weeks  All questions were answered. The patient knows to call the clinic with any problems, questions or concerns. I spent 20 minutes counseling the patient face to face. The total time spent in the appointment was 25 minutes and more than 50% was on counseling.  I have reviewed the above documentation for accuracy and completeness, and  I agree with the above information.   This document serves as a record of services personally performed by Truitt Merle, MD. It was created on her behalf by Brandt Loosen, a trained medical  scribe. The creation of this record is based on the scribe's personal observations and the provider's statements to them. This document has been checked and approved by the attending provider.    Truitt Merle, MD 05/26/2017

## 2017-05-26 ENCOUNTER — Ambulatory Visit: Payer: Medicaid Other

## 2017-05-26 ENCOUNTER — Ambulatory Visit (HOSPITAL_BASED_OUTPATIENT_CLINIC_OR_DEPARTMENT_OTHER): Payer: Medicaid Other

## 2017-05-26 ENCOUNTER — Encounter: Payer: Self-pay | Admitting: Hematology

## 2017-05-26 ENCOUNTER — Ambulatory Visit (HOSPITAL_BASED_OUTPATIENT_CLINIC_OR_DEPARTMENT_OTHER): Payer: Medicaid Other | Admitting: Hematology

## 2017-05-26 ENCOUNTER — Other Ambulatory Visit (HOSPITAL_BASED_OUTPATIENT_CLINIC_OR_DEPARTMENT_OTHER): Payer: Medicaid Other

## 2017-05-26 VITALS — BP 109/76 | HR 71 | Temp 98.1°F | Resp 18 | Ht 71.0 in | Wt 137.2 lb

## 2017-05-26 DIAGNOSIS — R51 Headache: Secondary | ICD-10-CM | POA: Diagnosis not present

## 2017-05-26 DIAGNOSIS — F418 Other specified anxiety disorders: Secondary | ICD-10-CM

## 2017-05-26 DIAGNOSIS — C20 Malignant neoplasm of rectum: Secondary | ICD-10-CM

## 2017-05-26 DIAGNOSIS — Z1509 Genetic susceptibility to other malignant neoplasm: Secondary | ICD-10-CM | POA: Diagnosis not present

## 2017-05-26 DIAGNOSIS — R918 Other nonspecific abnormal finding of lung field: Secondary | ICD-10-CM

## 2017-05-26 DIAGNOSIS — Z5111 Encounter for antineoplastic chemotherapy: Secondary | ICD-10-CM

## 2017-05-26 DIAGNOSIS — R197 Diarrhea, unspecified: Secondary | ICD-10-CM

## 2017-05-26 LAB — COMPREHENSIVE METABOLIC PANEL
ALT: 36 U/L (ref 0–55)
ANION GAP: 6 meq/L (ref 3–11)
AST: 48 U/L — AB (ref 5–34)
Albumin: 2.8 g/dL — ABNORMAL LOW (ref 3.5–5.0)
Alkaline Phosphatase: 76 U/L (ref 40–150)
BUN: 17.7 mg/dL (ref 7.0–26.0)
CALCIUM: 8.8 mg/dL (ref 8.4–10.4)
CHLORIDE: 112 meq/L — AB (ref 98–109)
CO2: 19 meq/L — AB (ref 22–29)
Creatinine: 0.9 mg/dL (ref 0.7–1.3)
EGFR: >90 mL/min/{1.73_m2} (ref >90)
Glucose: 145 mg/dl — ABNORMAL HIGH (ref 70–140)
POTASSIUM: 3.9 meq/L (ref 3.5–5.1)
Sodium: 137 mEq/L (ref 136–145)
Total Bilirubin: 0.42 mg/dL (ref 0.20–1.20)
Total Protein: 5.5 g/dL — ABNORMAL LOW (ref 6.4–8.3)

## 2017-05-26 LAB — CBC WITH DIFFERENTIAL/PLATELET
BASO%: 0.9 % (ref 0.0–2.0)
BASOS ABS: 0.1 10*3/uL (ref 0.0–0.1)
EOS%: 1.5 % (ref 0.0–7.0)
Eosinophils Absolute: 0.1 10*3/uL (ref 0.0–0.5)
HEMATOCRIT: 46.6 % (ref 38.4–49.9)
HGB: 15.4 g/dL (ref 13.0–17.1)
LYMPH#: 0.6 10*3/uL — AB (ref 0.9–3.3)
LYMPH%: 9.9 % — AB (ref 14.0–49.0)
MCH: 28.3 pg (ref 27.2–33.4)
MCHC: 33.1 g/dL (ref 32.0–36.0)
MCV: 85.5 fL (ref 79.3–98.0)
MONO#: 0.6 10*3/uL (ref 0.1–0.9)
MONO%: 11.2 % (ref 0.0–14.0)
NEUT#: 4.4 10*3/uL (ref 1.5–6.5)
NEUT%: 76.5 % — AB (ref 39.0–75.0)
PLATELETS: 111 10*3/uL — AB (ref 140–400)
RBC: 5.45 10*6/uL (ref 4.20–5.82)
RDW: 19.3 % — ABNORMAL HIGH (ref 11.0–14.6)
WBC: 5.7 10*3/uL (ref 4.0–10.3)

## 2017-05-26 LAB — CEA (IN HOUSE-CHCC): CEA (CHCC-IN HOUSE): 2 ng/mL (ref 0.00–5.00)

## 2017-05-26 MED ORDER — PALONOSETRON HCL INJECTION 0.25 MG/5ML
INTRAVENOUS | Status: AC
  Filled 2017-05-26: qty 5

## 2017-05-26 MED ORDER — DEXTROSE 5 % IV SOLN
Freq: Once | INTRAVENOUS | Status: AC
  Administered 2017-05-26: 13:00:00 via INTRAVENOUS

## 2017-05-26 MED ORDER — DEXTROSE 5 % IV SOLN
400.0000 mg/m2 | Freq: Once | INTRAVENOUS | Status: AC
  Administered 2017-05-26: 728 mg via INTRAVENOUS
  Filled 2017-05-26: qty 36.4

## 2017-05-26 MED ORDER — SODIUM CHLORIDE 0.9 % IV SOLN
2400.0000 mg/m2 | INTRAVENOUS | Status: DC
  Administered 2017-05-26: 4350 mg via INTRAVENOUS
  Filled 2017-05-26: qty 87

## 2017-05-26 MED ORDER — SODIUM CHLORIDE 0.9% FLUSH
10.0000 mL | INTRAVENOUS | Status: DC | PRN
  Filled 2017-05-26: qty 10

## 2017-05-26 MED ORDER — PALONOSETRON HCL INJECTION 0.25 MG/5ML
0.2500 mg | Freq: Once | INTRAVENOUS | Status: AC
  Administered 2017-05-26: 0.25 mg via INTRAVENOUS

## 2017-05-26 MED ORDER — OXALIPLATIN CHEMO INJECTION 100 MG/20ML
70.0000 mg/m2 | Freq: Once | INTRAVENOUS | Status: AC
  Administered 2017-05-26: 125 mg via INTRAVENOUS
  Filled 2017-05-26: qty 5

## 2017-05-26 MED ORDER — DEXAMETHASONE SODIUM PHOSPHATE 10 MG/ML IJ SOLN
10.0000 mg | Freq: Once | INTRAMUSCULAR | Status: AC
  Administered 2017-05-26: 10 mg via INTRAVENOUS

## 2017-05-26 MED ORDER — DEXAMETHASONE SODIUM PHOSPHATE 10 MG/ML IJ SOLN
INTRAMUSCULAR | Status: AC
  Filled 2017-05-26: qty 1

## 2017-05-26 NOTE — Patient Instructions (Signed)
[facility] Discharge Instructions for Patients Receiving Chemotherapy  Today you received the following chemotherapy agents: Oxaliplatin, Leucovorin and Adrucil.   To help prevent nausea and vomiting after your treatment, we encourage you to take your nausea medication as directed.  If you develop nausea and vomiting that is not controlled by your nausea medication, call the clinic.   BELOW ARE SYMPTOMS THAT SHOULD BE REPORTED IMMEDIATELY:  *FEVER GREATER THAN 100.5 F  *CHILLS WITH OR WITHOUT FEVER  NAUSEA AND VOMITING THAT IS NOT CONTROLLED WITH YOUR NAUSEA MEDICATION  *UNUSUAL SHORTNESS OF BREATH  *UNUSUAL BRUISING OR BLEEDING  TENDERNESS IN MOUTH AND THROAT WITH OR WITHOUT PRESENCE OF ULCERS  *URINARY PROBLEMS  *BOWEL PROBLEMS  UNUSUAL RASH Items with * indicate a potential emergency and should be followed up as soon as possible.  Feel free to call the clinic you have any questions or concerns. The clinic phone number is [phone].  Please show the CHEMO ALERT CARD at check-in to the Emergency Department and triage nurse.   

## 2017-05-27 ENCOUNTER — Telehealth: Payer: Self-pay | Admitting: Hematology

## 2017-05-27 NOTE — Telephone Encounter (Signed)
Per 6/7 los - no los

## 2017-05-28 ENCOUNTER — Ambulatory Visit (HOSPITAL_BASED_OUTPATIENT_CLINIC_OR_DEPARTMENT_OTHER): Payer: Medicaid Other

## 2017-05-28 VITALS — BP 101/67 | HR 80 | Temp 98.5°F

## 2017-05-28 DIAGNOSIS — C20 Malignant neoplasm of rectum: Secondary | ICD-10-CM | POA: Diagnosis not present

## 2017-05-28 MED ORDER — HEPARIN SOD (PORK) LOCK FLUSH 100 UNIT/ML IV SOLN
500.0000 [IU] | Freq: Once | INTRAVENOUS | Status: AC | PRN
  Administered 2017-05-28: 500 [IU]
  Filled 2017-05-28: qty 5

## 2017-05-28 MED ORDER — SODIUM CHLORIDE 0.9% FLUSH
10.0000 mL | INTRAVENOUS | Status: DC | PRN
  Administered 2017-05-28: 10 mL
  Filled 2017-05-28: qty 10

## 2017-05-29 ENCOUNTER — Encounter: Payer: Self-pay | Admitting: Hematology

## 2017-06-09 ENCOUNTER — Other Ambulatory Visit (HOSPITAL_BASED_OUTPATIENT_CLINIC_OR_DEPARTMENT_OTHER): Payer: Medicaid Other

## 2017-06-09 ENCOUNTER — Ambulatory Visit: Payer: Medicaid Other

## 2017-06-09 ENCOUNTER — Ambulatory Visit (HOSPITAL_BASED_OUTPATIENT_CLINIC_OR_DEPARTMENT_OTHER): Payer: Medicaid Other

## 2017-06-09 ENCOUNTER — Ambulatory Visit (HOSPITAL_BASED_OUTPATIENT_CLINIC_OR_DEPARTMENT_OTHER): Payer: Medicaid Other | Admitting: Nurse Practitioner

## 2017-06-09 VITALS — BP 110/73 | HR 85 | Temp 98.3°F | Resp 18 | Ht 71.0 in | Wt 145.0 lb

## 2017-06-09 DIAGNOSIS — C20 Malignant neoplasm of rectum: Secondary | ICD-10-CM

## 2017-06-09 DIAGNOSIS — R918 Other nonspecific abnormal finding of lung field: Secondary | ICD-10-CM

## 2017-06-09 DIAGNOSIS — Z1509 Genetic susceptibility to other malignant neoplasm: Secondary | ICD-10-CM | POA: Diagnosis not present

## 2017-06-09 LAB — COMPREHENSIVE METABOLIC PANEL
ALBUMIN: 2.5 g/dL — AB (ref 3.5–5.0)
ALK PHOS: 75 U/L (ref 40–150)
ALT: 36 U/L (ref 0–55)
ANION GAP: 5 meq/L (ref 3–11)
AST: 47 U/L — ABNORMAL HIGH (ref 5–34)
BUN: 10.5 mg/dL (ref 7.0–26.0)
CALCIUM: 8.4 mg/dL (ref 8.4–10.4)
CO2: 18 mEq/L — ABNORMAL LOW (ref 22–29)
CREATININE: 0.9 mg/dL (ref 0.7–1.3)
Chloride: 114 mEq/L — ABNORMAL HIGH (ref 98–109)
EGFR: >90 mL/min/{1.73_m2} (ref >90)
Glucose: 99 mg/dl (ref 70–140)
POTASSIUM: 4.4 meq/L (ref 3.5–5.1)
Sodium: 137 mEq/L (ref 136–145)
Total Bilirubin: 0.33 mg/dL (ref 0.20–1.20)
Total Protein: 5 g/dL — ABNORMAL LOW (ref 6.4–8.3)

## 2017-06-09 LAB — CBC WITH DIFFERENTIAL/PLATELET
BASO%: 0.6 % (ref 0.0–2.0)
BASOS ABS: 0 10*3/uL (ref 0.0–0.1)
EOS ABS: 0.1 10*3/uL (ref 0.0–0.5)
EOS%: 1.5 % (ref 0.0–7.0)
HEMATOCRIT: 44.3 % (ref 38.4–49.9)
HEMOGLOBIN: 14.7 g/dL (ref 13.0–17.1)
LYMPH#: 0.6 10*3/uL — AB (ref 0.9–3.3)
LYMPH%: 13.4 % — ABNORMAL LOW (ref 14.0–49.0)
MCH: 28.4 pg (ref 27.2–33.4)
MCHC: 33.2 g/dL (ref 32.0–36.0)
MCV: 85.5 fL (ref 79.3–98.0)
MONO#: 0.5 10*3/uL (ref 0.1–0.9)
MONO%: 11.5 % (ref 0.0–14.0)
NEUT#: 3.4 10*3/uL (ref 1.5–6.5)
NEUT%: 73 % (ref 39.0–75.0)
PLATELETS: 89 10*3/uL — AB (ref 140–400)
RBC: 5.18 10*6/uL (ref 4.20–5.82)
RDW: 17.4 % — ABNORMAL HIGH (ref 11.0–14.6)
WBC: 4.6 10*3/uL (ref 4.0–10.3)
nRBC: 0 % (ref 0–0)

## 2017-06-09 MED ORDER — SODIUM CHLORIDE 0.9% FLUSH
10.0000 mL | INTRAVENOUS | Status: DC | PRN
  Administered 2017-06-09: 10 mL
  Filled 2017-06-09: qty 10

## 2017-06-09 MED ORDER — HEPARIN SOD (PORK) LOCK FLUSH 100 UNIT/ML IV SOLN
500.0000 [IU] | Freq: Once | INTRAVENOUS | Status: AC | PRN
  Administered 2017-06-09: 500 [IU]
  Filled 2017-06-09: qty 5

## 2017-06-09 NOTE — Progress Notes (Addendum)
  [facility] OFFICE PROGRESS NOTE   Diagnosis:  Rectal cancer  INTERVAL HISTORY:   [name] returns as scheduled. He completed cycle 9 FOLFOX 05/26/2017. He denies nausea/vomiting. No mouth sores. No diarrhea. He has intermittent numbness in the left hand. No other numbness. This does not interfere with activity. No arm weakness. He reports the numbness has been present for approximately 2 months with slow progression over time.  Objective:  Vital signs in last 24 hours:  Blood pressure 110/73, pulse 85, temperature 98.3 F (36.8 C), temperature source Oral, resp. rate 18, height '5\' 11"'$  (1.803 m), weight 145 lb (65.8 kg), SpO2 100 %.    HEENT: No thrush or ulcers. Resp: Lungs clear bilaterally. Cardio: Regular rate and rhythm. GI: Abdomen soft and nontender. Right abdomen ileostomy. No hepatomegaly. Vascular: No leg edema. Neuro: Vibratory sense moderately decreased over the fingertips on the left hand and mildly decreased over the fingertips on the right hand per tuning fork exam.  Port-A-Cath without erythema.  Lab Results:  Lab Results  Component Value Date   WBC 4.6 06/09/2017   HGB 14.7 06/09/2017   HCT 44.3 06/09/2017   MCV 85.5 06/09/2017   PLT 89 (L) 06/09/2017   NEUTROABS 3.4 06/09/2017    Imaging:  No results found.  Medications: I have reviewed the patient's current medications.  Assessment/Plan: 1. Rectal adenocarcinoma, distal rectum, cT3N2aMx, stage IIIB vs IV, with indeterminate lung nodules, ypT3N1bMx; status post neoadjuvant radiation/Xeloda 08/17/2016 through 09/24/2016; status post 1 cycle of CAPOX, adjuvant, 01/19/2017, then changed to FOLFOX beginning 01/27/2017, cycle 9 FOLFOX completed 05/26/2017. 2. Lung nodules on CT 08/02/2016; PET scan 08/18/2016 with small pulmonary nodules too small to reliably characterize; CT 11/29/2016 with stable bilateral pulmonary nodules. 3. Lynch syndrome, MSH6 mutation   Disposition: [name]  appears stable. He has completed the planned course of adjuvant chemotherapy. We are referring him for a CT scan of the chest to follow-up the lung nodules. He will return for a follow-up visit with Dr. Burr Medico in 2 weeks to review the results.  Patient seen with Dr. Benay Spice in Dr. Ernestina Penna absence.    Ned Card ANP/GNP-BC   06/09/2017  11:47 AM This was a shared visit with Ned Card. [name] has completed a course of adjuvant therapy for rectal cancer. He has completed at least 6 months of adjuvant systemic therapy. I recommended he discontinue chemotherapy and proceed with the restaging chest CT.  Julieanne Manson, M.D.

## 2017-06-09 NOTE — Patient Instructions (Signed)
Implanted Port Home Guide An implanted port is a type of central line that is placed under the skin. Central lines are used to provide IV access when treatment or nutrition needs to be given through a person's veins. Implanted ports are used for long-term IV access. An implanted port may be placed because:  You need IV medicine that would be irritating to the small veins in your hands or arms.  You need long-term IV medicines, such as antibiotics.  You need IV nutrition for a long period.  You need frequent blood draws for lab tests.  You need dialysis.  Implanted ports are usually placed in the chest area, but they can also be placed in the upper arm, the abdomen, or the leg. An implanted port has two main parts:  Reservoir. The reservoir is round and will appear as a small, raised area under your skin. The reservoir is the part where a needle is inserted to give medicines or draw blood.  Catheter. The catheter is a thin, flexible tube that extends from the reservoir. The catheter is placed into a large vein. Medicine that is inserted into the reservoir goes into the catheter and then into the vein.  How will I care for my incision site? Do not get the incision site wet. Bathe or shower as directed by your health care provider. How is my port accessed? Special steps must be taken to access the port:  Before the port is accessed, a numbing cream can be placed on the skin. This helps numb the skin over the port site.  Your health care provider uses a sterile technique to access the port. ? Your health care provider must put on a mask and sterile gloves. ? The skin over your port is cleaned carefully with an antiseptic and allowed to dry. ? The port is gently pinched between sterile gloves, and a needle is inserted into the port.  Only "non-coring" port needles should be used to access the port. Once the port is accessed, a blood return should be checked. This helps ensure that the port  is in the vein and is not clogged.  If your port needs to remain accessed for a constant infusion, a clear (transparent) bandage will be placed over the needle site. The bandage and needle will need to be changed every week, or as directed by your health care provider.  Keep the bandage covering the needle clean and dry. Do not get it wet. Follow your health care provider's instructions on how to take a shower or bath while the port is accessed.  If your port does not need to stay accessed, no bandage is needed over the port.  What is flushing? Flushing helps keep the port from getting clogged. Follow your health care provider's instructions on how and when to flush the port. Ports are usually flushed with saline solution or a medicine called heparin. The need for flushing will depend on how the port is used.  If the port is used for intermittent medicines or blood draws, the port will need to be flushed: ? After medicines have been given. ? After blood has been drawn. ? As part of routine maintenance.  If a constant infusion is running, the port may not need to be flushed.  How long will my port stay implanted? The port can stay in for as long as your health care provider thinks it is needed. When it is time for the port to come out, surgery will be   done to remove it. The procedure is similar to the one performed when the port was put in. When should I seek immediate medical care? When you have an implanted port, you should seek immediate medical care if:  You notice a bad smell coming from the incision site.  You have swelling, redness, or drainage at the incision site.  You have more swelling or pain at the port site or the surrounding area.  You have a fever that is not controlled with medicine.  This information is not intended to replace advice given to you by your health care provider. Make sure you discuss any questions you have with your health care provider. Document  Released: 12/06/2005 Document Revised: 05/13/2016 Document Reviewed: 08/13/2013 Elsevier Interactive Patient Education  2017 Elsevier Inc.  

## 2017-06-09 NOTE — Patient Instructions (Signed)

## 2017-06-17 ENCOUNTER — Other Ambulatory Visit: Payer: Self-pay | Admitting: *Deleted

## 2017-06-17 DIAGNOSIS — C20 Malignant neoplasm of rectum: Secondary | ICD-10-CM

## 2017-06-17 MED ORDER — DIPHENOXYLATE-ATROPINE 2.5-0.025 MG PO TABS: 1.0000 | ORAL_TABLET | Freq: Four times a day (QID) | ORAL | 1 refills | Status: DC | PRN

## 2017-06-20 ENCOUNTER — Encounter (HOSPITAL_COMMUNITY): Payer: Self-pay

## 2017-06-20 ENCOUNTER — Ambulatory Visit (HOSPITAL_COMMUNITY)
Admission: RE | Admit: 2017-06-20 | Discharge: 2017-06-20 | Disposition: A | Discharge status: Home / Self Care | Payer: Medicaid Other | Source: Ambulatory Visit | Attending: Nurse Practitioner | Admitting: Nurse Practitioner

## 2017-06-20 DIAGNOSIS — J439 Emphysema, unspecified: Secondary | ICD-10-CM | POA: Insufficient documentation

## 2017-06-20 DIAGNOSIS — C20 Malignant neoplasm of rectum: Secondary | ICD-10-CM | POA: Diagnosis not present

## 2017-06-20 DIAGNOSIS — R918 Other nonspecific abnormal finding of lung field: Secondary | ICD-10-CM | POA: Insufficient documentation

## 2017-06-20 MED ORDER — IOPAMIDOL (ISOVUE-300) INJECTION 61%
INTRAVENOUS | Status: AC
  Administered 2017-06-20: 75 mL via INTRAVENOUS
  Filled 2017-06-20: qty 75

## 2017-06-20 MED ORDER — HEPARIN SOD (PORK) LOCK FLUSH 100 UNIT/ML IV SOLN
INTRAVENOUS | Status: AC
  Administered 2017-06-20: 500 [IU]
  Filled 2017-06-20: qty 5

## 2017-06-20 MED ORDER — IOPAMIDOL (ISOVUE-300) INJECTION 61%
75.0000 mL | Freq: Once | INTRAVENOUS | Status: AC | PRN
  Administered 2017-06-20: 75 mL via INTRAVENOUS

## 2017-06-21 NOTE — Progress Notes (Signed)
[place]  [phone] Fax:([phone]) [phone]  Clinic follow Up Note   Patient Care Team: Patient, No Pcp Per as PCP - General (General Practice) Tania Ade, RN as Registered Nurse Michael Boston, MD as Consulting Physician (General Surgery) [name], Kirke Corin, MD as Consulting Physician (Gastroenterology) Kyung Rudd, MD as Consulting Physician (Radiation Oncology) Truitt Merle, MD as Consulting Physician (Oncology) 06/23/2017  CHIEF COMPLAINTS:  Follow up rectal cancer  Oncology History   Rectal adenocarcinoma [facility])   Staging form: Colon and Rectum, AJCC 7th Edition   - Clinical stage from 07/28/2016: Stage IIIB (T3, N2a, M0) - Signed by Truitt Merle, MD on 08/06/2016      Rectal adenocarcinoma s/p protctocolectomy, IPAA "J" pouch 12/01/2016   07/26/2016 Imaging    CT chest, abdomen and pelvis with contrast showed nodular thickening of the rectal wall, enlarged perirectal lymph nodes. Diverticulosis. Nonspecific low-level lymphadenopathy. Lower lobe emphysema, small 3-4 mm left lower lobe lung nodules.      07/28/2016 Initial Diagnosis    Rectal adenocarcinoma ([place])      07/28/2016 Initial Biopsy    Rectal mass biopsy showed invasive adenocarcinoma, polyps in the ascending colon and rectum showed tubular adenoma       07/28/2016 Procedure    Colonoscopy showed a nearly circumferential mass in the distal rectum, 4 polyps in the descending colon, diverticulosis in the left colon.       07/29/2016 Procedure    Low EUS showed clearly malignant 6 cm long, nearly circumferential mass in the distal rectum, with distal edge located to 1 cm from the annual approach, multiple prior rectal lymph nodes (6) are suspicious ,uT3 N2       08/17/2016 - 09/24/2016 Radiation Therapy    Neoadjuvant radiation to his rectal cancer 1) Rectum/ 45 Gy in 25 fx                      2) Boost/ 5.4 Gy in 3 fx       08/17/2016 - 09/24/2016 Chemotherapy    Xeloda 1500 mg twice daily  with concurrent irradiation      08/18/2016 Imaging    PET scan showed hypermetabolic rectal mass, perirectal node 69m with mild increased uptake, no hypermetabolic distant metastasis, small pulmonary nodules are too small to characterize by PET.       11/29/2016 Imaging    CT of the C/A/P W Contrast IMPRESSION: 1. Interval treatment changes within the perirectal fat with otherwise stable rectosigmoid colon wall thickening. 2. No progressive adenopathy or distant metastases identified. 3. Stable bilateral pulmonary nodules. Short-term stability (for 4 months) is reassuring, although the nodules are too small to optimally characterize by previous PET-CT. Continued CT follow-up recommended.      12/01/2016 Surgery    Colon total resection and proctocolectomy for rectal cancer and Lynch syndrome       12/01/2016 Pathology Results    Proctocolectomy showed scattered small residual foci of invasive moderately differentiated adenocarcinoma embedded in submucosa tissue with extensive neoadjuvant effect, 2.5 cm in greatest time mention, adenocarcinoma invades through muscularis mucosa and involves subserosal soft tissue, margins are negative, 2 of 35 lymph nodes are positive for metastatic adenocarcinoma, 2 calcified and necrotic soft tissue nodules are also present without visible tumor seen.      01/07/2017 - 05/26/2017 Chemotherapy    Adjuvant chemo CAPOX, changed to FOLFOX from cycle 2 due to poor tolerance, a total of 4.5 months therapy  01/26/2017 Surgery    PLACEMENT OF PORT-A-CATH CENTRAL LINE WITH FLUOROSCOPY AND ULTRASOUND      06/21/2017 Imaging    CT Chest w contrast 06/21/2017 IMPRESSION: 1. No evidence of metastatic disease. 2.  Emphysema (ICD10-J43.9). 3. Scattered subpleural pulmonary nodules measure 5 mm or less in size, stable and likely subpleural lymph nodes.       HISTORY OF PRESENTING ILLNESS:  [name] 46 y.o. male is here because of His newly diagnosed  rectal cancer. He is accompanied by his girlfriend to our multidisciplinary GI clinic today.  He has had rectal pain for 6 weeks, and mild rectal bleeding with BM, he goes 4 times a day, very little each time, and pencil shaped stool, he has lost 30 lbs so far. His appettie remains to be good, he eats well, no cough, nausea, no abd pain. He has a moderate fatigue, he will 2 tolerate his routine activities including his job without much difficulty. He does not have a primary care physician, and has not seen doctors for many years.  He presents to emergency room on 07/26/2016 for the rectal pain and bleeding, and was referred to gastroenterologist Dr. Loletha Carrow. He underwent colonoscopy on 07/28/2016, which showed 4 polyps in the ascending colon, and a circumferential distal rectal mass. Biopsy of the rectal mass showed adenocarcinoma. He underwent EUS by Dr. Ardis Hughs on 07/29/2016 which showed a T3 N2 disease.   CURRENT THERAPY: Surveillance   INTERIM HISTORY  Mr. Graser returns for follow-up. 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 He occasionally can't walk due to numbness of feet. It wakes him up at night. His energy and appetite are both good. He is gaining some weight. 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 He has no other concerns.  MEDICAL HISTORY:  Past Medical History:  Diagnosis Date  . Cancer ([place]) 12/01/2016   colon   . Family history of colon cancer   . Poor dental hygiene        SURGICAL HISTORY: Past Surgical History:  Procedure Laterality Date  . EUS N/A 07/29/2016   Procedure: LOWER ENDOSCOPIC ULTRASOUND (EUS);  Surgeon: Milus Banister, MD;  Location: [facility];  Service: Endoscopy;  Laterality: N/A;  . PORTACATH PLACEMENT N/A 01/26/2017   Procedure: PLACEMENT OF PORT-A-CATH CENTRAL LINE WITH FLUOROSCOPY AND ULTRASOUND;  Surgeon: Michael Boston, MD;  Location: [place];  Service: General;  Laterality: N/A;  . PROCTOSCOPY N/A 12/01/2016    Procedure: RIGID PROCTOSCOPY;  Surgeon: Michael Boston, MD;  Location: [facility];  Service: General;  Laterality: N/A;  . TOE AMPUTATION Left   . XI ROBOTIC ASSISTED LOWER ANTERIOR RESECTION N/A 12/01/2016   Procedure: XI ROBOTIC ASSISTED PROCTOCOLECTOMY WITH ILLEOPOUCH ANASTAMOSIS WITH DIVERTING ILLEOSTOMY;  Surgeon: Michael Boston, MD;  Location: [facility];  Service: General;  Laterality: N/A;    SOCIAL HISTORY: Social History   Social History  . Marital status: Married    Spouse name: N/A  . Number of children: 3  . Years of education: N/A   Occupational History  . Not on file.   Social History Main Topics  . Smoking status: Former Smoker    Packs/day: 1.50    Years: 30.00    Types: Cigarettes    Quit date: 12/01/2016  . Smokeless tobacco: Never Used  . Alcohol use No  . Drug use: No     Comment: patient denies  . Sexual activity: Not on file   Other Topics  Concern  . Not on file   Social History Narrative   Lives with significant other, [name]   Have a 59 year old son   Smoker      [place] HISTORY: Family History  Problem Relation Age of Onset  . Diabetes Mother   . COPD Father   . Colon cancer Maternal Aunt        dx in her 3s  . Diabetes Maternal Grandmother   . Brain cancer Maternal Grandfather   . Lung cancer Paternal Grandfather   . Colon cancer Cousin        dx in her 72s  . Bone cancer Sister 8  . Pancreatic cancer Neg Hx   . Esophageal cancer Neg Hx   . Stomach cancer Neg Hx   . Liver disease Neg Hx    He lives with his girfriend and their 84 yo son, works for [employer]   ALLERGIES:  has No Known Allergies.  MEDICATIONS:  Current Outpatient Prescriptions  Medication Sig Dispense Refill  . ALPRAZolam (XANAX) 0.5 MG tablet Take 0.5 tablets (0.25 mg total) by mouth at bedtime as needed for anxiety. For anxiety (Patient not taking: Reported on 06/09/2017) 20 tablet 0  . diphenoxylate-atropine (LOMOTIL) 2.5-0.025 MG  tablet Take 1-2 tablets by mouth 4 (four) times daily as needed for diarrhea or loose stools. 60 tablet 1  . gabapentin (NEURONTIN) 100 MG capsule Take 1 capsule (100 mg total) by mouth 3 (three) times daily. 90 capsule 1  . methocarbamol (ROBAXIN) 500 MG tablet Take 1 tablet (500 mg total) by mouth every 6 (six) hours as needed for muscle spasms. (Patient not taking: Reported on 06/09/2017) 30 tablet 0  . naproxen sodium (ALEVE) 220 MG tablet Take 440 mg by mouth 2 (two) times daily as needed (for pain.).    Marland Kitchen polyethylene glycol (MIRALAX / GLYCOLAX) packet Take 17 g by mouth daily as needed for moderate constipation.     . prochlorperazine (COMPAZINE) 10 MG tablet Take 1 tablet (10 mg total) by mouth every 6 (six) hours as needed for nausea or vomiting. (Patient not taking: Reported on 05/06/2017) 30 tablet 2  . traMADol (ULTRAM) 50 MG tablet Take 1-2 tablets (50-100 mg total) by mouth every 6 (six) hours as needed for moderate pain or severe pain (for pain.). (Patient not taking: Reported on 06/09/2017) 30 tablet 0   Current Facility-Administered Medications  Medication Dose Route Frequency Provider Last Rate Last Dose  . 0.9 %  sodium chloride infusion  500 mL Intravenous Continuous [name], Estill Cotta III, MD      . sodium chloride flush (NS) 0.9 % injection 10 mL  10 mL Intracatheter PRN Truitt Merle, MD   10 mL at 06/23/17 1004    REVIEW OF SYSTEMS:  Constitutional: Denies fevers, chills or abnormal night sweats  Eyes: Denies blurriness of vision, double vision or watery eyes Ears, nose, mouth, throat, and face: Denies mucositis or sore throat Respiratory: Denies cough, dyspnea or wheezes Cardiovascular: Denies palpitation, chest discomfort or lower extremity swelling Gastrointestinal:  Denies heartburn  Skin: Denies abnormal skin rashes Lymphatics: Denies new lymphadenopathy or easy bruising Neurological:(+) increased neuropathy in just left hand Behavioral/Psych: Mood is stable, no new changes    All other systems were reviewed with the patient and are negative.  PHYSICAL EXAMINATION:  ECOG PERFORMANCE STATUS: 1  Vitals:   06/23/17 0909  BP: (!) 91/51  Pulse: 73  Resp: 20  Temp:  97.7 F (36.5 C)   Filed Weights   06/23/17 0909  Weight: 142 lb 14.4 oz (64.8 kg)     GENERAL:alert, no distress and comfortable SKIN: skin color, texture, turgor are normal, no rashes or significant lesions EYES: normal, conjunctiva are pink and non-injected, sclera clear OROPHARYNX:no exudate, no erythema and lips, buccal mucosa, and tongue normal  NECK: supple, thyroid normal size, non-tender, without nodularity LYMPH:  no palpable lymphadenopathy in the cervical, axillary or inguinal LUNGS: clear to auscultation and percussion with normal breathing effort HEART: regular rate & rhythm and no murmurs and no lower extremity edema ABDOMEN:abdomen soft, non-tender and normal bowel sounds. (+) coleostomy bag with red watery substance in the right abdomen, surgical incision sites has healed well. Musculoskeletal:no cyanosis of digits and no clubbing  PSYCH: alert & oriented x 3 with fluent speech NEURO: no focal motor/sensory deficits  LABORATORY DATA:  I have reviewed the data as listed CBC Latest Ref Rng & Units 06/23/2017 06/09/2017 05/26/2017  WBC 4.0 - 10.3 10e3/uL 4.7 4.6 5.7  Hemoglobin 13.0 - 17.1 g/dL 14.9 14.7 15.4  Hematocrit 38.4 - 49.9 % 44.4 44.3 46.6  Platelets 140 - 400 10e3/uL 115(L) 89(L) 111(L)   CMP Latest Ref Rng & Units 06/23/2017 06/09/2017 05/26/2017  Glucose 70 - 140 mg/dl 113 99 145(H)  BUN 7.0 - 26.0 mg/dL 18.3 10.5 17.7  Creatinine 0.7 - 1.3 mg/dL 0.8 0.9 0.9  Sodium 136 - 145 mEq/L 141 137 137  Potassium 3.5 - 5.1 mEq/L 4.1 4.4 3.9  Chloride 101 - 111 mmol/L - - -  CO2 22 - 29 mEq/L 22 18(L) 19(L)  Calcium 8.4 - 10.4 mg/dL 8.8 8.4 8.8  Total Protein 6.4 - 8.3 g/dL 4.9(L) 5.0(L) 5.5(L)  Total Bilirubin 0.20 - 1.20 mg/dL 0.26 0.33 0.42  Alkaline Phos 40 - 150 U/L 81  75 76  AST 5 - 34 U/L 67(H) 47(H) 48(H)  ALT 0 - 55 U/L 45 36 36   Pathology report  Diagnosis 07/28/2016 1. Surgical [P], descending, polyps(2) -TUBULAR ADENOMAS. -NO HIGH GRADE DYSPLASIA OR MALIGNANCY IDENTIFIED. 2. Surgical [P], rectal mass -INVASIVE ADENOCARCINOMA. -SEE COMMENT. 3. Surgical [P], rectum, polyps(2) -TUBULAR ADENOMAS. -NO HIGH GRADE DYSPLASIA OR MALIGNANCY IDENTIFIED.    Diagnosis 12/01/2016 1. Colon, total resection (incl lymph nodes), proctocolectomy - SCATTERED SMALL RESIDUAL FOCI (EACH LESS THAN 0.3 CM) OF INVASIVE MODERATELY DIFFERENTIATED ADENOCARCINOMA EMBEDDED IN SUBMUCOSAL TISSUE WITH EXTENSIVE NEOADJUVANT EFFECT. - TUMOR IS PRESENT WITHIN A REGION MEASURING APPROXIMATELY 2.5 CM IN GREATEST DIMENSION. - ADENOCARCINOMA INVADES THROUGH MUSCULARIS MUCOSA AND INVOLVES SUBSEROSAL SOFT TISSUE. - MARGINS ARE NEGATIVE. - TWO OF THIRTY-FIVE LYMPH NODES ARE POSITIVE FOR METASTATIC ADENOCARCINOMA (2/35). - LYMPH NODES ALSO DEMONSTRATE VARIABLE DEGREES OF NEOADJUVANT EFFECT. - TWO CALCIFIED AND NECROTIC SOFT TISSUE NODULES ARE ALSO PRESENT WITHOUT VIABLE TUMOR SEEN. - SEE ONCOLOGY TEMPLATE. 2. Colon, resection margin (donut), distal ring final distal margin - BENIGN ANAL MUCOSA. - NO DYSPLASIA OR TUMOR SEEN.  RADIOGRAPHIC STUDIES: I have personally reviewed the radiological images as listed and agreed with the findings in the report.  CT Chest w contrast 06/21/2017 IMPRESSION: 1. No evidence of metastatic disease. 2.  Emphysema (ICD10-J43.9). 3. Scattered subpleural pulmonary nodules measure 5 mm or less in size, stable and likely subpleural lymph nodes.  EUS 07/29/2016 -Clearly malignant 6cm long, nearly circumferential mass in the distal rectum with distal edge located 1cm from the anal verge. If this is rectal adenocarcinoma it is uT3N2a, Stage IIIa.  COLONOSCOPY 07/28/2016 -  Rectal mass. - Four 4 mm polyps in the rectum and in the descending colon,  removed with a cold snare. Resected and retrieved. - Diverticulosis in the left colon. - Likely malignant tumor in the distal rectum. Biopsied. Tattooed.  CT of the chest/abd/pelvis with contrast 11/29/2016 IMPRESSION: 1. Interval treatment changes within the perirectal fat with otherwise stable rectosigmoid colon wall thickening. 2. No progressive adenopathy or distant metastases identified. 3. Stable bilateral pulmonary nodules. Short-term stability (for 4 months) is reassuring, although the nodules are too small to optimally characterize by previous PET-CT. Continued CT follow-up recommended.   ASSESSMENT & PLAN: 46 y.o. male without significant past medical history presented with rectal pain and bleeding.  1. 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 He also has a slightly prominent periaortic lymph node, about 8 mm. -I previously reviewed his PET CT scan findings with patient and his girl firend, the small lung nodules and periaortic lymph node are not hypermetabolic, no definitive evidence of metastasis on the PET scan. -He previously completed neoadjuvant chemoradiation, tolerated moderate well overall.  -I previously reviewed his surgical pathology findings in great details. He had computed surgical resection, but unfortunately still has significant residual disease including 2 positive lymph nodes. -I previously recommended him to have adjuvant chemotherapy to reduce his risk of recurrence. Due to the positive lymph nodes, I recommend FOLFOX or CAPOX for 4.5 months. He has completed now, tolerated well  overall. -The goal of therapy is curative. However his lung nodules are suspicious for metastatic disease, needs to be followed closely. If he does have lung metastasis, then his cancer is unlikely curable. -I reviewed his surveillance CT from 06/21/2017, which showed small and stable pulmonary nodules, no other new lesions.  -He is clinically doing well, lab reviewed. We'll continue surveillance. Due to his lung nodules, we'll repeat CT CAP scan in 4 months, last CT abdomen was in 11/2016.   2. Diarrhea  -He has watery stool after total colectomy, his ileostomy output has significant increases since he started chemotherapy -I encouraged him to continue using Imodium and Lomotil.  -Hysteria has much improved with Lomotil, he will continue -I have previously set him up for IV fluids 2-3 times a week, off now  - Diarrhea has improved overall  3. 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 -He has undergone total colectomy and proctocolectomy, his risk of secondary colon cancer is minimal -We previously discussed other cancer risks from age syndrome, I previously recommend him to have EGD every 3-5 years, annual urinalysis for GU cancer screening.   4. Anxiety  -He previously felt nervous and anxious since his cancer diagnosis, much improved after starting treatment  -Continue Xanax as needed  5. Social issues -He is out of work, he wants to apply for Medicaid and disability  -He was seen by our Education officer, museum  6. Headache and pain at ileostomy bag -I previously encouraged him to use Tylenol for headaches.  -He has stated his pain is unbearable sometimes. He is taking oxycodone. 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 - Patient is no longer taking  Oxycodone for pain  7. Worsening neuropathy - I will prescribe Neurontin today for 30 day  Plan  - prescribe neurontin, he will start at the 100 mg twice daily, and gradually titrate up, up to 37m 3 times a day. - port flush in 1 month with follow up in 2 months, will order CT scan on next visit   All questions were answered. The patient knows to call the clinic with any problems, questions or concerns. I spent 20 minutes counseling the patient face to face. The total time spent in the appointment was 25 minutes and more than 50% was on counseling.  I have reviewed the above documentation for accuracy and completeness, and I agree with the above information.   This document serves as a record of services personally performed by YTruitt Merle MD. It was created on her behalf by TBrandt Loosen a trained medical scribe. The creation of this record is based on the scribe's personal observations and the provider's statements to them. This document has been checked and approved by the attending provider.    FTruitt Merle MD 06/23/2017

## 2017-06-23 ENCOUNTER — Ambulatory Visit (HOSPITAL_BASED_OUTPATIENT_CLINIC_OR_DEPARTMENT_OTHER): Payer: Medicaid Other | Admitting: Hematology

## 2017-06-23 ENCOUNTER — Other Ambulatory Visit (HOSPITAL_BASED_OUTPATIENT_CLINIC_OR_DEPARTMENT_OTHER): Payer: Medicaid Other

## 2017-06-23 ENCOUNTER — Ambulatory Visit: Payer: Medicaid Other

## 2017-06-23 VITALS — BP 91/51 | HR 73 | Temp 97.7°F | Resp 20 | Ht 71.0 in | Wt 142.9 lb

## 2017-06-23 DIAGNOSIS — C20 Malignant neoplasm of rectum: Secondary | ICD-10-CM

## 2017-06-23 DIAGNOSIS — F419 Anxiety disorder, unspecified: Secondary | ICD-10-CM | POA: Diagnosis not present

## 2017-06-23 DIAGNOSIS — G629 Polyneuropathy, unspecified: Secondary | ICD-10-CM

## 2017-06-23 DIAGNOSIS — R51 Headache: Secondary | ICD-10-CM

## 2017-06-23 DIAGNOSIS — R197 Diarrhea, unspecified: Secondary | ICD-10-CM

## 2017-06-23 DIAGNOSIS — R911 Solitary pulmonary nodule: Secondary | ICD-10-CM | POA: Diagnosis not present

## 2017-06-23 DIAGNOSIS — Z1509 Genetic susceptibility to other malignant neoplasm: Secondary | ICD-10-CM | POA: Diagnosis not present

## 2017-06-23 LAB — COMPREHENSIVE METABOLIC PANEL
ALT: 45 U/L (ref 0–55)
AST: 67 U/L — ABNORMAL HIGH (ref 5–34)
Albumin: 2.5 g/dL — ABNORMAL LOW (ref 3.5–5.0)
Alkaline Phosphatase: 81 U/L (ref 40–150)
Anion Gap: 7 mEq/L (ref 3–11)
BILIRUBIN TOTAL: 0.26 mg/dL (ref 0.20–1.20)
BUN: 18.3 mg/dL (ref 7.0–26.0)
CO2: 22 meq/L (ref 22–29)
CREATININE: 0.8 mg/dL (ref 0.7–1.3)
Calcium: 8.8 mg/dL (ref 8.4–10.4)
Chloride: 112 mEq/L — ABNORMAL HIGH (ref 98–109)
EGFR: >90 mL/min/{1.73_m2} (ref >90)
GLUCOSE: 113 mg/dL (ref 70–140)
Potassium: 4.1 mEq/L (ref 3.5–5.1)
SODIUM: 141 meq/L (ref 136–145)
TOTAL PROTEIN: 4.9 g/dL — AB (ref 6.4–8.3)

## 2017-06-23 LAB — CBC WITH DIFFERENTIAL/PLATELET
BASO%: 0.6 % (ref 0.0–2.0)
Basophils Absolute: 0 10*3/uL (ref 0.0–0.1)
EOS ABS: 0.1 10*3/uL (ref 0.0–0.5)
EOS%: 1.5 % (ref 0.0–7.0)
HCT: 44.4 % (ref 38.4–49.9)
HGB: 14.9 g/dL (ref 13.0–17.1)
LYMPH%: 12.4 % — ABNORMAL LOW (ref 14.0–49.0)
MCH: 28.7 pg (ref 27.2–33.4)
MCHC: 33.6 g/dL (ref 32.0–36.0)
MCV: 85.4 fL (ref 79.3–98.0)
MONO#: 0.6 10*3/uL (ref 0.1–0.9)
MONO%: 12 % (ref 0.0–14.0)
NEUT%: 73.5 % (ref 39.0–75.0)
NEUTROS ABS: 3.5 10*3/uL (ref 1.5–6.5)
NRBC: 0 % (ref 0–0)
Platelets: 115 10*3/uL — ABNORMAL LOW (ref 140–400)
RBC: 5.2 10*6/uL (ref 4.20–5.82)
RDW: 17.5 % — AB (ref 11.0–14.6)
WBC: 4.7 10*3/uL (ref 4.0–10.3)
lymph#: 0.6 10*3/uL — ABNORMAL LOW (ref 0.9–3.3)

## 2017-06-23 LAB — CEA (IN HOUSE-CHCC): CEA (CHCC-In House): 2.01 ng/mL (ref 0.00–5.00)

## 2017-06-23 MED ORDER — HEPARIN SOD (PORK) LOCK FLUSH 100 UNIT/ML IV SOLN
500.0000 [IU] | Freq: Once | INTRAVENOUS | Status: AC | PRN
  Administered 2017-06-23: 500 [IU]
  Filled 2017-06-23: qty 5

## 2017-06-23 MED ORDER — SODIUM CHLORIDE 0.9% FLUSH
10.0000 mL | INTRAVENOUS | Status: DC | PRN
  Administered 2017-06-23: 10 mL
  Filled 2017-06-23: qty 10

## 2017-06-23 MED ORDER — GABAPENTIN 100 MG PO CAPS
100.0000 mg | ORAL_CAPSULE | Freq: Three times a day (TID) | ORAL | 1 refills | Status: DC
Start: 2017-06-23 — End: 2017-07-23

## 2017-06-25 ENCOUNTER — Encounter: Payer: Self-pay | Admitting: Hematology

## 2017-06-26 ENCOUNTER — Telehealth: Payer: Self-pay | Admitting: Hematology

## 2017-06-26 NOTE — Telephone Encounter (Signed)
S/w pt's wife, gave appts for 8/2 + 8/30.

## 2017-07-21 ENCOUNTER — Ambulatory Visit (HOSPITAL_BASED_OUTPATIENT_CLINIC_OR_DEPARTMENT_OTHER): Payer: Medicaid Other

## 2017-07-21 DIAGNOSIS — Z95828 Presence of other vascular implants and grafts: Secondary | ICD-10-CM

## 2017-07-21 DIAGNOSIS — C20 Malignant neoplasm of rectum: Secondary | ICD-10-CM

## 2017-07-21 MED ORDER — SODIUM CHLORIDE 0.9% FLUSH
10.0000 mL | INTRAVENOUS | Status: DC | PRN
  Administered 2017-07-21: 10 mL via INTRAVENOUS
  Filled 2017-07-21: qty 10

## 2017-07-21 MED ORDER — HEPARIN SOD (PORK) LOCK FLUSH 100 UNIT/ML IV SOLN
500.0000 [IU] | Freq: Once | INTRAVENOUS | Status: AC
  Administered 2017-07-21: 500 [IU] via INTRAVENOUS
  Filled 2017-07-21: qty 5

## 2017-07-21 NOTE — Patient Instructions (Signed)

## 2017-07-23 ENCOUNTER — Other Ambulatory Visit: Payer: Self-pay | Admitting: Hematology

## 2017-08-17 NOTE — Progress Notes (Signed)
[name]  [phone] [phone]  Clinic follow Up Note   Patient Care Team: Patient, No Pcp Per as PCP - General (General Practice) Tania Ade, RN as Registered Nurse Michael Boston, MD as Consulting Physician (General Surgery) [name], Kirke Corin, MD as Consulting Physician (Gastroenterology) Kyung Rudd, MD as Consulting Physician (Radiation Oncology) Truitt Merle, MD as Consulting Physician (Oncology) 08/18/2017  CHIEF COMPLAINTS:  Follow up rectal cancer  Oncology History   Rectal adenocarcinoma [facility])   Staging form: Colon and Rectum, AJCC 7th Edition   - Clinical stage from 07/28/2016: Stage IIIB (T3, N2a, M0) - Signed by Truitt Merle, MD on 08/06/2016      Rectal adenocarcinoma s/p protctocolectomy, IPAA "J" pouch 12/01/2016   07/26/2016 Imaging    CT chest, abdomen and pelvis with contrast showed nodular thickening of the rectal wall, enlarged perirectal lymph nodes. Diverticulosis. Nonspecific low-level lymphadenopathy. Lower lobe emphysema, small 3-4 mm left lower lobe lung nodules.      07/28/2016 Initial Diagnosis    Rectal adenocarcinoma ([place])      07/28/2016 Initial Biopsy    Rectal mass biopsy showed invasive adenocarcinoma, polyps in the ascending colon and rectum showed tubular adenoma       07/28/2016 Procedure    Colonoscopy showed a nearly circumferential mass in the distal rectum, 4 polyps in the descending colon, diverticulosis in the left colon.       07/29/2016 Procedure    Low EUS showed clearly malignant 6 cm long, nearly circumferential mass in the distal rectum, with distal edge located to 1 cm from the annual approach, multiple prior rectal lymph nodes (6) are suspicious ,uT3 N2       08/17/2016 - 09/24/2016 Radiation Therapy    Neoadjuvant radiation to his rectal cancer 1) Rectum/ 45 Gy in 25 fx                      2) Boost/ 5.4 Gy in 3 fx       08/17/2016 - 09/24/2016 Chemotherapy    Xeloda 1500 mg twice daily  with concurrent irradiation      08/18/2016 Imaging    PET scan showed hypermetabolic rectal mass, perirectal node 27m with mild increased uptake, no hypermetabolic distant metastasis, small pulmonary nodules are too small to characterize by PET.       11/29/2016 Imaging    CT of the C/A/P W Contrast IMPRESSION: 1. Interval treatment changes within the perirectal fat with otherwise stable rectosigmoid colon wall thickening. 2. No progressive adenopathy or distant metastases identified. 3. Stable bilateral pulmonary nodules. Short-term stability (for 4 months) is reassuring, although the nodules are too small to optimally characterize by previous PET-CT. Continued CT follow-up recommended.      12/01/2016 Surgery    Colon total resection and proctocolectomy for rectal cancer and Lynch syndrome       12/01/2016 Pathology Results    Proctocolectomy showed scattered small residual foci of invasive moderately differentiated adenocarcinoma embedded in submucosa tissue with extensive neoadjuvant effect, 2.5 cm in greatest time mention, adenocarcinoma invades through muscularis mucosa and involves subserosal soft tissue, margins are negative, 2 of 35 lymph nodes are positive for metastatic adenocarcinoma, 2 calcified and necrotic soft tissue nodules are also present without visible tumor seen.      01/07/2017 - 05/26/2017 Chemotherapy    Adjuvant chemo CAPOX, changed to FOLFOX from cycle 2 due to poor tolerance, a total of 4.5 months therapy  01/26/2017 Surgery    PLACEMENT OF PORT-A-CATH CENTRAL LINE WITH FLUOROSCOPY AND ULTRASOUND      06/21/2017 Imaging    CT Chest w contrast 06/21/2017 IMPRESSION: 1. No evidence of metastatic disease. 2.  Emphysema (ICD10-J43.9). 3. Scattered subpleural pulmonary nodules measure 5 mm or less in size, stable and likely subpleural lymph nodes.       HISTORY OF PRESENTING ILLNESS:  [name] 46 y.o. male is here because of His newly diagnosed  rectal cancer. He is accompanied by his girlfriend to our multidisciplinary GI clinic today.  He has had rectal pain for 6 weeks, and mild rectal bleeding with BM, he goes 4 times a day, very little each time, and pencil shaped stool, he has lost 30 lbs so far. His appettie remains to be good, he eats well, no cough, nausea, no abd pain. He has a moderate fatigue, he will 2 tolerate his routine activities including his job without much difficulty. He does not have a primary care physician, and has not seen doctors for many years.  He presents to emergency room on 07/26/2016 for the rectal pain and bleeding, and was referred to gastroenterologist Dr. Loletha Carrow. He underwent colonoscopy on 07/28/2016, which showed 4 polyps in the ascending colon, and a circumferential distal rectal mass. Biopsy of the rectal mass showed adenocarcinoma. He underwent EUS by Dr. Ardis Hughs on 07/29/2016 which showed a T3 N2 disease.   CURRENT THERAPY: Surveillance   INTERIM HISTORY  [name] returns for follow-up. He presents to the clinic today with his wife. He reports his takes lomotil to help his output, He takes 6. He empties bag 3-4 times a day.  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 He take Neurontin 2 in the morning and 2 at night. He is able to do things with his hands and he is able to walk. He does not feel unsteady.    MEDICAL HISTORY:  Past Medical History:  Diagnosis Date  . Cancer ([place]) 12/01/2016   colon   . Family history of colon cancer   . Poor dental hygiene        SURGICAL HISTORY: Past Surgical History:  Procedure Laterality Date  . EUS N/A 07/29/2016   Procedure: LOWER ENDOSCOPIC ULTRASOUND (EUS);  Surgeon: Milus Banister, MD;  Location: [facility];  Service: Endoscopy;  Laterality: N/A;  . PORTACATH PLACEMENT N/A 01/26/2017   Procedure: PLACEMENT OF PORT-A-CATH CENTRAL LINE WITH FLUOROSCOPY AND ULTRASOUND;  Surgeon: Michael Boston, MD;  Location: [place];   Service: General;  Laterality: N/A;  . PROCTOSCOPY N/A 12/01/2016   Procedure: RIGID PROCTOSCOPY;  Surgeon: Michael Boston, MD;  Location: [facility];  Service: General;  Laterality: N/A;  . TOE AMPUTATION Left   . XI ROBOTIC ASSISTED LOWER ANTERIOR RESECTION N/A 12/01/2016   Procedure: XI ROBOTIC ASSISTED PROCTOCOLECTOMY WITH ILLEOPOUCH ANASTAMOSIS WITH DIVERTING ILLEOSTOMY;  Surgeon: Michael Boston, MD;  Location: [facility];  Service: General;  Laterality: N/A;    SOCIAL HISTORY: Social History   Social History  . Marital status: Married    Spouse name: N/A  . Number of children: 3  . Years of education: N/A   Occupational History  . Not on file.   Social History Main Topics  . Smoking status: Former Smoker    Packs/day: 1.50    Years: 30.00    Types: Cigarettes    Quit date: 12/01/2016  . Smokeless tobacco: Never Used  . Alcohol use No  . Drug  use: No     Comment: patient denies  . Sexual activity: Not on file   Other Topics Concern  . Not on file   Social History Narrative   Lives with significant other, [name]   Have a 54 year old son   Smoker      [place] HISTORY: Family History  Problem Relation Age of Onset  . Diabetes Mother   . COPD Father   . Colon cancer Maternal Aunt        dx in her 40s  . Diabetes Maternal Grandmother   . Brain cancer Maternal Grandfather   . Lung cancer Paternal Grandfather   . Colon cancer Cousin        dx in her 12s  . Bone cancer Sister 8  . Pancreatic cancer Neg Hx   . Esophageal cancer Neg Hx   . Stomach cancer Neg Hx   . Liver disease Neg Hx    He lives with his girfriend and their 68 yo son, works for [employer]   ALLERGIES:  has No Known Allergies.  MEDICATIONS:  Current Outpatient Prescriptions  Medication Sig Dispense Refill  . diphenoxylate-atropine (LOMOTIL) 2.5-0.025 MG tablet Take 1-2 tablets by mouth 4 (four) times daily as needed for diarrhea or loose stools. 180 tablet 2  .  gabapentin (NEURONTIN) 100 MG capsule Take 3 capsules (300 mg total) by mouth 3 (three) times daily. 180 capsule 0  . ALPRAZolam (XANAX) 0.5 MG tablet Take 0.5 tablets (0.25 mg total) by mouth at bedtime as needed for anxiety. For anxiety (Patient not taking: Reported on 06/09/2017) 20 tablet 0  . methocarbamol (ROBAXIN) 500 MG tablet Take 1 tablet (500 mg total) by mouth every 6 (six) hours as needed for muscle spasms. (Patient not taking: Reported on 06/09/2017) 30 tablet 0  . naproxen sodium (ALEVE) 220 MG tablet Take 440 mg by mouth 2 (two) times daily as needed (for pain.).    Marland Kitchen polyethylene glycol (MIRALAX / GLYCOLAX) packet Take 17 g by mouth daily as needed for moderate constipation.     . prochlorperazine (COMPAZINE) 10 MG tablet Take 1 tablet (10 mg total) by mouth every 6 (six) hours as needed for nausea or vomiting. (Patient not taking: Reported on 05/06/2017) 30 tablet 2  . traMADol (ULTRAM) 50 MG tablet Take 1-2 tablets (50-100 mg total) by mouth every 6 (six) hours as needed for moderate pain or severe pain (for pain.). (Patient not taking: Reported on 06/09/2017) 30 tablet 0   Current Facility-Administered Medications  Medication Dose Route Frequency Provider Last Rate Last Dose  . 0.9 %  sodium chloride infusion  500 mL Intravenous Continuous [name], Estill Cotta III, MD        REVIEW OF SYSTEMS:  Constitutional: Denies fevers, chills or abnormal night sweats  Eyes: Denies blurriness of vision, double vision or watery eyes Ears, nose, mouth, throat, and face: Denies mucositis or sore throat Respiratory: Denies cough, dyspnea or wheezes Cardiovascular: Denies palpitation, chest discomfort or lower extremity swelling Gastrointestinal:  Denies heartburn  Skin: Denies abnormal skin rashes Lymphatics: Denies new lymphadenopathy or easy bruising Neurological:(+) increased neuropathy in just left hand Behavioral/Psych: Mood is stable, no new changes  All other systems were reviewed with the  patient and are negative.  PHYSICAL EXAMINATION:  ECOG PERFORMANCE STATUS: 1  Vitals:   08/18/17 1147  BP: 108/70  Pulse: 72  Resp: 18  Temp: 98.4 F (36.9 C)  SpO2: 99%  Filed Weights   08/18/17 1147  Weight: 152 lb 4.8 oz (69.1 kg)     GENERAL:alert, no distress and comfortable SKIN: skin color, texture, turgor are normal, no rashes or significant lesions EYES: normal, conjunctiva are pink and non-injected, sclera clear OROPHARYNX:no exudate, no erythema and lips, buccal mucosa, and tongue normal  NECK: supple, thyroid normal size, non-tender, without nodularity LYMPH:  no palpable lymphadenopathy in the cervical, axillary or inguinal LUNGS: clear to auscultation and percussion with normal breathing effort HEART: regular rate & rhythm and no murmurs and no lower extremity edema ABDOMEN:abdomen soft, non-tender and normal bowel sounds. (+) coleostomy bag with red watery substance in the right abdomen, surgical incision sites has healed well. Musculoskeletal:no cyanosis of digits and no clubbing  PSYCH: alert & oriented x 3 with fluent speech NEURO: no focal motor/sensory deficits  LABORATORY DATA:  I have reviewed the data as listed CBC Latest Ref Rng & Units 08/18/2017 06/23/2017 06/09/2017  WBC 4.0 - 10.3 10e3/uL 7.5 4.7 4.6  Hemoglobin 13.0 - 17.1 g/dL 96.3 54.0 70.7  Hematocrit 38.4 - 49.9 % 47.0 44.4 44.3  Platelets 140 - 400 10e3/uL 170 115(L) 89(L)   CMP Latest Ref Rng & Units 08/18/2017 06/23/2017 06/09/2017  Glucose 70 - 140 mg/dl 707 395 99  BUN 7.0 - 14.6 mg/dL 8.8 39.8 08.1  Creatinine 0.7 - 1.3 mg/dL 1.0 0.8 0.9  Sodium 454 - 145 mEq/L 139 141 137  Potassium 3.5 - 5.1 mEq/L 4.3 4.1 4.4  Chloride 101 - 111 mmol/L - - -  CO2 22 - 29 mEq/L 23 22 18(L)  Calcium 8.4 - 10.4 mg/dL 8.6 8.8 8.4  Total Protein 6.4 - 8.3 g/dL 6.2(I) 4.9(L) 5.0(L)  Total Bilirubin 0.20 - 1.20 mg/dL 1.20 3.79 2.86  Alkaline Phos 40 - 150 U/L 65 81 75  AST 5 - 34 U/L 26 67(H) 47(H)  ALT  0 - 55 U/L 25 45 36   Pathology report  Diagnosis 07/28/2016 1. Surgical [P], descending, polyps(2) -TUBULAR ADENOMAS. -NO HIGH GRADE DYSPLASIA OR MALIGNANCY IDENTIFIED. 2. Surgical [P], rectal mass -INVASIVE ADENOCARCINOMA. -SEE COMMENT. 3. Surgical [P], rectum, polyps(2) -TUBULAR ADENOMAS. -NO HIGH GRADE DYSPLASIA OR MALIGNANCY IDENTIFIED.    Diagnosis 12/01/2016 1. Colon, total resection (incl lymph nodes), proctocolectomy - SCATTERED SMALL RESIDUAL FOCI (EACH LESS THAN 0.3 CM) OF INVASIVE MODERATELY DIFFERENTIATED ADENOCARCINOMA EMBEDDED IN SUBMUCOSAL TISSUE WITH EXTENSIVE NEOADJUVANT EFFECT. - TUMOR IS PRESENT WITHIN A REGION MEASURING APPROXIMATELY 2.5 CM IN GREATEST DIMENSION. - ADENOCARCINOMA INVADES THROUGH MUSCULARIS MUCOSA AND INVOLVES SUBSEROSAL SOFT TISSUE. - MARGINS ARE NEGATIVE. - TWO OF THIRTY-FIVE LYMPH NODES ARE POSITIVE FOR METASTATIC ADENOCARCINOMA (2/35). - LYMPH NODES ALSO DEMONSTRATE VARIABLE DEGREES OF NEOADJUVANT EFFECT. - TWO CALCIFIED AND NECROTIC SOFT TISSUE NODULES ARE ALSO PRESENT WITHOUT VIABLE TUMOR SEEN. - SEE ONCOLOGY TEMPLATE. 2. Colon, resection margin (donut), distal ring final distal margin - BENIGN ANAL MUCOSA. - NO DYSPLASIA OR TUMOR SEEN.  RADIOGRAPHIC STUDIES: I have personally reviewed the radiological images as listed and agreed with the findings in the report.  CT Chest w contrast 06/21/2017 IMPRESSION: 1. No evidence of metastatic disease. 2.  Emphysema (ICD10-J43.9). 3. Scattered subpleural pulmonary nodules measure 5 mm or less in size, stable and likely subpleural lymph nodes.  EUS 07/29/2016 -Clearly malignant 6cm long, nearly circumferential mass in the distal rectum with distal edge located 1cm from the anal verge. If this is rectal adenocarcinoma it is uT3N2a, Stage IIIa.  COLONOSCOPY 07/28/2016 - Rectal mass. - Four 4 mm  polyps in the rectum and in the descending colon, removed with a cold snare. Resected and  retrieved. - Diverticulosis in the left colon. - Likely malignant tumor in the distal rectum. Biopsied. Tattooed.  CT of the chest/abd/pelvis with contrast 11/29/2016 IMPRESSION: 1. Interval treatment changes within the perirectal fat with otherwise stable rectosigmoid colon wall thickening. 2. No progressive adenopathy or distant metastases identified. 3. Stable bilateral pulmonary nodules. Short-term stability (for 4 months) is reassuring, although the nodules are too small to optimally characterize by previous PET-CT. Continued CT follow-up recommended.   ASSESSMENT & PLAN: 46 y.o. male without significant past medical history presented with rectal pain and bleeding.  1. 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 He also has a slightly prominent periaortic lymph node, about 8 mm. -I previously reviewed his PET CT scan findings with patient and his girl firend, the small lung nodules and periaortic lymph node are not hypermetabolic, no definitive evidence of metastasis on the PET scan. -He previously completed neoadjuvant chemoradiation, tolerated moderate well overall.  -I previously reviewed his surgical pathology findings in great details. He had computed surgical resection, but unfortunately still has significant residual disease including 2 positive lymph nodes. -I previously recommended him to have adjuvant chemotherapy to reduce his risk of recurrence. Due to the positive lymph nodes, I recommend FOLFOX or CAPOX for 4.5 months. He has completed now, tolerated well overall. -I reviewed his surveillance CT from  06/20/2017, which showed small and stable pulmonary nodules, no other new lesions, no evidence of of metastases.   -He can get his colostomy bag reversed by Dr. Johney Maine now. I discussed dirrhea is still likely afterward. -He is clinically doing well, lab reviewed, his CBC, CMP and CEA are within normal limits. We'll continue surveillance. We'll repeat CT CAP scan in 3 months -I will f/u with him in 3 months  2. Diarrhea  -He has watery stool after total colectomy, his ileostomy output has significant increases since he started chemotherapy -I encouraged him to continue using Imodium and Lomotil.  -Hysteria has much improved with Lomotil, he will continue -I have previously set him up for IV fluids 2-3 times a week, off now  - Diarrhea has improved overall, refilled lomotil today.  3. 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 -He has undergone total colectomy and proctocolectomy, his risk of secondary colon cancer is minimal -We previously discussed other cancer risks from age syndrome, I previously recommend him to have EGD every 3-5 years, annual urinalysis for GU cancer screening.   4. Anxiety  -He previously felt nervous and anxious since his cancer diagnosis, much improved after starting treatment  -Continue Xanax as needed  5. Social issues -He is out of work, he wants to apply for Medicaid and disability  -He was seen by our Education officer, museum  6. Headache and pain at ileostomy bag -I previously encouraged him to use Tylenol for headaches.  -He has stated his pain is unbearable sometimes. He is taking oxycodone. 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 - Patient is no longer taking Oxycodone for pain  7. Worsening neuropathy - I previously  prescribed Neurontin on 06/23/17 for 30 day -He is currently taking Neurontin 200 mg twice daily, which helps neuropathy has worsened lately, I will increase Neurontin to 200-'300mg'$  in the morning and afternoon, and 300-'400mg'$  at bed time   Plan  -Refill Neurontin and increase dose. If he tolerates well, or change to 300 mg pills not on next refill -Refill lomotil today -port flush today -Copy note to Dr. Johney Maine for colostomy reversal  -Port flush and lab in 6 weeks  -F/u in 3 months  -Lab, flush, and CT CAP with contrast before next OV    All questions were answered. The patient knows to call the clinic with any problems, questions or concerns. I spent 20 minutes counseling the patient face to face. The total time spent in the appointment was 25 minutes and more than 50% was on counseling.  I have reviewed the above documentation for accuracy and completeness, and I agree with the above information.   This document serves as a record of services personally performed by Truitt Merle, MD. It was created on her behalf by Joslyn Devon, a trained medical scribe. The creation of this record is based on the scribe's personal observations and the provider's statements to them. This document has been checked and approved by the attending provider.     Truitt Merle, MD 08/18/2017

## 2017-08-18 ENCOUNTER — Ambulatory Visit (HOSPITAL_BASED_OUTPATIENT_CLINIC_OR_DEPARTMENT_OTHER): Payer: Medicaid Other | Admitting: Hematology

## 2017-08-18 ENCOUNTER — Other Ambulatory Visit (HOSPITAL_BASED_OUTPATIENT_CLINIC_OR_DEPARTMENT_OTHER): Payer: Medicaid Other

## 2017-08-18 ENCOUNTER — Encounter: Payer: Self-pay | Admitting: Hematology

## 2017-08-18 VITALS — BP 108/70 | HR 72 | Temp 98.4°F | Resp 18 | Ht 71.0 in | Wt 152.3 lb

## 2017-08-18 DIAGNOSIS — R5383 Other fatigue: Secondary | ICD-10-CM | POA: Diagnosis not present

## 2017-08-18 DIAGNOSIS — F418 Other specified anxiety disorders: Secondary | ICD-10-CM

## 2017-08-18 DIAGNOSIS — G629 Polyneuropathy, unspecified: Secondary | ICD-10-CM | POA: Diagnosis not present

## 2017-08-18 DIAGNOSIS — R51 Headache: Secondary | ICD-10-CM

## 2017-08-18 DIAGNOSIS — R197 Diarrhea, unspecified: Secondary | ICD-10-CM | POA: Diagnosis not present

## 2017-08-18 DIAGNOSIS — R634 Abnormal weight loss: Secondary | ICD-10-CM

## 2017-08-18 DIAGNOSIS — R918 Other nonspecific abnormal finding of lung field: Secondary | ICD-10-CM | POA: Diagnosis not present

## 2017-08-18 DIAGNOSIS — C20 Malignant neoplasm of rectum: Secondary | ICD-10-CM

## 2017-08-18 DIAGNOSIS — Z1509 Genetic susceptibility to other malignant neoplasm: Secondary | ICD-10-CM

## 2017-08-18 LAB — CEA (IN HOUSE-CHCC): CEA (CHCC-In House): 1.71 ng/mL (ref 0.00–5.00)

## 2017-08-18 LAB — COMPREHENSIVE METABOLIC PANEL
ALT: 25 U/L (ref 0–55)
AST: 26 U/L (ref 5–34)
Albumin: 2.8 g/dL — ABNORMAL LOW (ref 3.5–5.0)
Alkaline Phosphatase: 65 U/L (ref 40–150)
Anion Gap: 5 mEq/L (ref 3–11)
BUN: 8.8 mg/dL (ref 7.0–26.0)
CHLORIDE: 111 meq/L — AB (ref 98–109)
CO2: 23 meq/L (ref 22–29)
Calcium: 8.6 mg/dL (ref 8.4–10.4)
Creatinine: 1 mg/dL (ref 0.7–1.3)
EGFR: >90 mL/min/{1.73_m2} (ref >90)
GLUCOSE: 119 mg/dL (ref 70–140)
POTASSIUM: 4.3 meq/L (ref 3.5–5.1)
SODIUM: 139 meq/L (ref 136–145)
Total Bilirubin: 0.4 mg/dL (ref 0.20–1.20)
Total Protein: 5.4 g/dL — ABNORMAL LOW (ref 6.4–8.3)

## 2017-08-18 LAB — CBC WITH DIFFERENTIAL/PLATELET
BASO%: 0.9 % (ref 0.0–2.0)
Basophils Absolute: 0.1 10*3/uL (ref 0.0–0.1)
EOS%: 1.2 % (ref 0.0–7.0)
Eosinophils Absolute: 0.1 10*3/uL (ref 0.0–0.5)
HCT: 47 % (ref 38.4–49.9)
HGB: 15.7 g/dL (ref 13.0–17.1)
LYMPH%: 14.2 % (ref 14.0–49.0)
MCH: 29.3 pg (ref 27.2–33.4)
MCHC: 33.3 g/dL (ref 32.0–36.0)
MCV: 87.8 fL (ref 79.3–98.0)
MONO#: 0.5 10*3/uL (ref 0.1–0.9)
MONO%: 7.1 % (ref 0.0–14.0)
NEUT#: 5.7 10*3/uL (ref 1.5–6.5)
NEUT%: 76.6 % — AB (ref 39.0–75.0)
Platelets: 170 10*3/uL (ref 140–400)
RBC: 5.36 10*6/uL (ref 4.20–5.82)
RDW: 16.7 % — AB (ref 11.0–14.6)
WBC: 7.5 10*3/uL (ref 4.0–10.3)
lymph#: 1.1 10*3/uL (ref 0.9–3.3)

## 2017-08-18 LAB — TECHNOLOGIST REVIEW

## 2017-08-18 MED ORDER — DIPHENOXYLATE-ATROPINE 2.5-0.025 MG PO TABS: 1.0000 | ORAL_TABLET | Freq: Four times a day (QID) | ORAL | 2 refills | Status: DC | PRN

## 2017-08-18 MED ORDER — GABAPENTIN 100 MG PO CAPS: 300.0000 mg | ORAL_CAPSULE | Freq: Three times a day (TID) | ORAL | 0 refills | Status: DC

## 2017-08-18 MED ORDER — SODIUM CHLORIDE 0.9 % IJ SOLN
10.0000 mL | Freq: Once | INTRAMUSCULAR | Status: AC
  Administered 2017-08-18: 10 mL
  Filled 2017-08-18: qty 10

## 2017-08-18 MED ORDER — HEPARIN SOD (PORK) LOCK FLUSH 100 UNIT/ML IV SOLN
500.0000 [IU] | Freq: Once | INTRAVENOUS | Status: AC
  Administered 2017-08-18: 500 [IU]
  Filled 2017-08-18: qty 5

## 2017-08-19 ENCOUNTER — Telehealth: Payer: Self-pay

## 2017-08-19 NOTE — Telephone Encounter (Signed)
Called patient left a message concerning upcoming appointment. Will also mail patient calender for October and November. Per los

## 2017-08-29 ENCOUNTER — Ambulatory Visit: Payer: Self-pay | Admitting: Surgery

## 2017-08-29 NOTE — H&P (Signed)
[name] 08/29/2017 2:04 PM Location: [facility] Patient #: [id number] DOB: [birth year] Married / Language: [name] / Race: White Male  History of Present Illness Tyrone Hector MD; 08/29/2017 2:55 PM) The patient is a 46 year old male who presents with colorectal cancer. 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  chemotherapy Adjuvant chemo CAPOX, changed to FOLFOX from cycle 2 due to poor tolerance, a total of 4.5 months therapy. 01/07/2017 - 05/26/2017  ` ` ` Patient comes today with his wife. He is feeling better. He got worsening diarrhea on the first round of oral Xeloda chemotherapy. Switched to FOLFOX IV chemotherapy. Had some bad diarrhea with the chemotherapy but now emptying the bag about 4-5 times a day. Gained 15 lbs. Using Miralax low dose. Lomotil 6 tabs/day total (2 TID). empties bag 4-5x a day (1/2 full). Appetite good. Energy level good. Getting occasional IV fluids through Dr. Burr Medico. Last lab values last week showed normal electrolytes. No nausea or vomiting. Minimal anal drainage. Not wearing diapers. He's urinating fine. Not lightheaded or dizzy. Unfortunately has started smoking again. He promises that he is only doing 2-3 cigarettes a day now. No fevers chills or sweats.  ` ` ` `   12/01/2016  PATIENT: [name] 46 y.o. 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)  PROCEDURE:  XI ROBOTIC ASSISTED PROCTOCOLECTOMY ILEAL "J" POUCH ANAL ANASTAMOSIS (IPAA) DIVERTING LOOP ILEOSTOMY RIGID PROCTOSCOPY  SURGEON: Tyrone Hector, MD, FACS, MASCRS  ASSISTANT: Leighton Ruff, MD, FACS, FASCRS  ANESTHESIA: General, Local anesthetic as a field block and anorectal and perianal field block}  EBL: Total I/O In: 4250 [I.V.:4000; IV Piggyback:250] Out: 700 [Urine:500; Blood:200] "see anesthesia record"  Delay start of Pharmacological VTE agent (>24hrs) due to surgical blood loss or risk of bleeding: no  DRAINS: 19 Fr Blake drain in the pelvis  SPECIMEN: Source of Specimen: 1. COLON AND RECTUM 2. DISTAL ANASTOMOTIC RING  DISPOSITION OF SPECIMEN: PATHOLOGY  COUNTS: YES  PLAN OF CARE: Admit to inpatient  PATIENT DISPOSITION: PACU - hemodynamically stable.  INDICATION:   Patient with bowel changes and found to have very distal bulky rectal cancer. Genetic studies showed Lynch syndrome. Recommend patient made for low anterior resection. 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 Offered ileal pouch anal anastomosis with diverting loop ileostomy. Possible permanent ileostomy discussed.   The anatomy & physiology of the digestive tract was discussed. The pathophysiology was discussed. Natural history risks without surgery was discussed. I worked to give an overview of the disease and the frequent need to have multispecialty involvement. I feel the risks of no intervention will lead to serious problems that outweigh the operative risks; therefore, I recommended a partial colectomy to remove the pathology. Laparoscopic & open techniques were discussed.   Risks such as bleeding, infection, abscess, leak, reoperation, possible ostomy, hernia, impotence, incontinence, heart attack, death, and  other risks were discussed.  I noted a good likelihood this will help address the problem. Goals of post-operative recovery were discussed as well. We will work to minimize complications. Educational materials on the pathology had been given in the office. Questions were answered.   The patient expressed understanding & wished to proceed with surgery.  OR FINDINGS:  Patient had a bulky scarring consistent with rectal cancer involving the distal half of the rectum. Scar 3-4cm from the anal verge.  No obvious metastatic disease on visceral parietal peritoneum or liver.  He has a ileal J-pouch. 15 cm long. 25 EEA stapled to the anal canal. Ring at just above the sphincters. Small anterior leak. The anastomosis rests 0 cm from the anal verge by rigid proctoscopy.  Diverting loop ileostomy.    PATHOLOGY: FINAL DIAGNOSIS Diagnosis 1. Colon, total resection (incl lymph nodes), proctocolectomy - SCATTERED SMALL RESIDUAL FOCI (EACH LESS THAN 0.3 CM) OF INVASIVE MODERATELY DIFFERENTIATED ADENOCARCINOMA EMBEDDED IN SUBMUCOSAL TISSUE WITH EXTENSIVE NEOADJUVANT EFFECT. - TUMOR IS PRESENT WITHIN A REGION MEASURING APPROXIMATELY 2.5 CM IN GREATEST DIMENSION. - ADENOCARCINOMA INVADES THROUGH MUSCULARIS MUCOSA AND INVOLVES SUBSEROSAL SOFT TISSUE. - MARGINS ARE NEGATIVE. - TWO OF THIRTY-FIVE LYMPH NODES ARE POSITIVE FOR METASTATIC ADENOCARCINOMA (2/35). - LYMPH NODES ALSO DEMONSTRATE VARIABLE DEGREES OF NEOADJUVANT EFFECT. - TWO CALCIFIED AND NECROTIC SOFT TISSUE NODULES ARE ALSO PRESENT WITHOUT VIABLE TUMOR SEEN. - SEE ONCOLOGY TEMPLATE. 2. Colon, resection margin (donut), distal ring final distal margin - BENIGN ANAL MUCOSA. - NO DYSPLASIA OR TUMOR SEEN. Microscopic Comment 1. COLON AND RECTUM (INCLUDING TRANS-ANAL RESECTION): Specimen: Colon and rectum with attached appendix. Procedure: Robotic assisted proctocolectomy. Tumor site: Rectum. Specimen integrity: Intact. Macroscopic  intactness of mesorectum: Near complete: The mesorectum is near complete with coning at the distal margin. Macroscopic tumor perforation: No. Invasive tumor: Maximum size: There is a 2.5 cm area of ulcerated colon with neoadjuvant changes and scattered small (less than 0.3 cm) foci of tumor. Histologic type(s): Invasive adenocarcinoma. Histologic grade and differentiation: G2: moderately differentiated/low grade. Type of polyp in which invasive carcinoma arose: Precursor polyp is not identified. Microscopic extension of invasive tumor: Tumor invades through muscularis propria to involve subserosal soft tissues. 1 of 3 FINAL for Gadson, Rayshun T [phone]) Microscopic Comment(continued) Lymph-Vascular invasion: Lymph/vascular invasion is not identified; however, several lymph nodes are positive for metastatic tumor, see below. Peri-neural invasion: Not identified. Tumor deposit(s) (discontinuous extramural extension): No viable tumor deposits are identified. Resection margins (transanal resection margins): Proximal margin: Negative. Distal margin: Negative. Circumferential (radial) (posterior ascending, posterior descending; lateral and posterior mid-rectum; and entire lower 1/3 rectum): Negative. Mucosal margin: Negative. Distance closest margin (if all above margins negative): 0.5 cm (radial / circumferential soft tissue margin in distal rectum). Distance closest mucosal margin (if negative): Greater than 0.6 cm (based on amount of tissue within cassettes adjacent to distal stapled margin). Treatment effect (neo-adjuvant therapy): Prominent treatment effect is identified within the tumor and within the lymph nodes. Additional polyp(s): No additional polyps identified. Non-neoplastic findings: Diverticulosis. Lymph nodes: number examined 35; number positive: 2. Pathologic Staging: ypT3, ypN1b. Ancillary studies: As the patient is status post neoadjuvant therapy and there are only  small residual foci of tumor, additional studies will not be performed unless otherwise requested. (RH:ecj 12/06/2016) Willeen Niece MD Pathologist, Electronic Signature (Case signed 12/06/2016) Specimen Mitul Hallowell and Clinical Information Specimen(s) Obtained: 1. Colon, total resection (incl lymph nodes), proctocolectomy 2. Colon, resection margin (donut), distal ring final distal margin Specimen Clinical Information 1. low rectal cancer (kp) Lorrie Gargan 1. Specimen:  Clinically proctocolectomy, which includes portion of terminal ileum, and attached appendix. There is at least portion distal 1/3 of rectum. Specimen integrity: Intact Specimen length: Portion of terminal ileum is 4 cm and the colon is 152 cm from cecal to distal margin Mesorectal intactness: The mesorectum is near complete, with coning at the distal margin. There is muscle visible around the distal 3 cm. This area (mesorectal envelop) is inked black. Tumor location: Distal 1/3 of rectum Tumor size: Along predominantly the posterior wall to left lateral wall is a 1.5 cm in length and 2.5 cm in width, and up to 0.5 cm deep cavitary scarred area of tan red granular flattened mucosa. Residual lesion is not identified. Percent of bowel circumference involved: Approximately 25 to 30% Tumor distance to margins: Proximal: 154.5 cm Distal: The left lateral edge of the scar is focally involved with the stapled distal margin. 2 of 3 FINAL for Bourdeau, Izsak T [phone]) Jaquia Benedicto(continued) Radial (posterior ascending, posterior descending; lateral and posterior mid-rectum; and entire lower 1/3 rectum): The scar is 0.8 cm from the inked perirectal soft tissue margin. Macroscopic extent of tumor invasion: There is no Keven Soucy lesion to evaluate invasion. Within the wall beneath the scar is gray white indurated tissue, which abuts the inked perirectal soft tissue margin. Total presumed lymph nodes: Found are forty possible lymph nodes ranging from  0.3 to 1.2 cm. Extramural satellite tumor nodules: None Mucosal polyp(s): None Additional findings: There are several unperforated diverticuli within the sigmoid. The appendix is 6.2 cm in length, averages 0.6 cm in diameter, has a smooth pink serosa and unremarkable cut surfaces. Block summary: A = proximal margin B, C = sections adjacent to stapled distal margin D-H = area of scar, full thickness I, J = scar, inner half sections K = diverticulum L = appendix. M = four nodes, whole N = four nodes, whole O = four nodes, whole P = five node, whole Q = four nodes, whole R = four nodes, whole S = four nodes, whole T = five nodes, whole U = five nodes, whole V = distal lymph node, bisected Twenty-two blocks. 2. Received in formalin is a 1 cm in length and up to 1 cm in diameter tubular portion of tissue, unoriented, with one end having possible light brown skin. On opening, there is tan pink smooth mucosa. The specimen is sectioned lengthwise and entirely submitted in two blocks. (SSW:kh 12-04-16) Report signed out from the following location(s) Technical component and interpretation was performed at [facility] [address], [place], [place] [address]. 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 Oral Surgery Tonsillectomy  Diagnostic Studies History Tyrone Hector, MD; 08/29/2017 2:24 PM) Colonoscopy within last year  Allergies Tyrone Hector, MD; 08/29/2017 2:24 PM) No Known Drug Allergies  11/02/2016  Medication History (Alisha Spillers, CMA; 08/29/2017 2:06 PM) Lomotil (2.5-0.'025MG'$  Tablet, Oral) Active. Gabapentin ('100MG'$  Capsule, Oral) Active. Medications Reconciled  Social History Tyrone Hector, MD; 08/29/2017 2:24 PM) Caffeine use Carbonated beverages. No alcohol use No drug use Tobacco use Current every day smoker.  Family History Tyrone Hector, MD; 08/29/2017 2:24 PM)  Colon Cancer Family Members In General. Diabetes Mellitus Mother. Hypertension Mother. Kidney Disease Mother.  Other Problems Ardeth Sportsman, MD; 08/29/2017 2:24 PM) Anxiety Disorder Colon Cancer Gastroesophageal Reflux Disease Hemorrhoids    Vitals (Alisha Spillers CMA; 08/29/2017 2:05 PM) 08/29/2017 2:04 PM Weight: 158.4 lb Height: 72in Body Surface Area: 1.93 m Body Mass Index: 21.48 kg/m  Pulse: 68 (Regular)  BP: 110/74 (Sitting, Left Arm, Standard)      Physical Exam Ardeth Sportsman MD; 08/29/2017 3:00 PM)  General Mental Status-Alert. General Appearance-Not in acute distress. Voice-Normal. Note: Tired but not toxic.  Integumentary Global Assessment Normal Exam - Distribution of scalp and body hair is normal. General Characteristics Overall Skin Surface - no rashes and no suspicious lesions.  Head and Neck Head-normocephalic, atraumatic with no lesions or palpable masses. Face Global Assessment - atraumatic, no absence of expression. Neck Global Assessment - no abnormal movements, no decreased range of motion. Trachea-midline. Thyroid Gland Characteristics - non-tender.  Eye Eyeball - Left-Extraocular movements intact, No Nystagmus. Eyeball - Right-Extraocular movements intact, No Nystagmus. Upper Eyelid - Left-No Cyanotic. Upper Eyelid - Right-No Cyanotic.  Chest and Lung Exam Inspection Accessory muscles - No use of accessory muscles in breathing.  Abdomen Note: Right lower quadrant ileostomy pink with  thick tan succus in bag. No leak or rash. Incisions with normal healing ridges. No cellulitis. No guarding/rebound tenderness  Male Genitourinary Note: No inguinal hernias. Normal external genitalia. Epididymi, testes, and spermatic cords normal without any masses.  Rectal Note: Normal sphincter tone. Staple anastomosis 1. 5-2 centimeters from anal verge. Strictured but allow [place] index finger to go through.  Perianal skin clean with good hygiene. No pruritis ani. No pilonidal disease. No fissure. No abscess/fistula. Normal sphincter tone.  No external hemorrhoids. No condyloma warts. Tolerates digital rectal exam. No rectal masses.   Peripheral Vascular Upper Extremity Inspection - Left - Not Gangrenous, No Petechiae. Right - Not Gangrenous, No Petechiae.  Neurologic Neurologic evaluation reveals -normal attention span and ability to concentrate, able to name objects and repeat phrases. Appropriate fund of knowledge and normal coordination.  Neuropsychiatric Mental status exam performed with findings of-able to articulate well with normal speech/language, rate, volume and coherence and no evidence of hallucinations, delusions, obsessions or homicidal/suicidal ideation. Orientation-oriented X3.  Musculoskeletal Global Assessment Gait and Station - normal gait and station. Note: Right upper inner arm PICC line in place. I removed. He tolerated well.  Lymphatic General Lymphatics Description - No Generalized lymphadenopathy.    Assessment & Plan Ardeth Sportsman MD; 08/29/2017 3:00 PM)  RECTAL ADENOCARCINOMA (C20) Impression: Rectal cancer 4cm from anal verge s/p chemoXRT. Status post robotic resection.  Pathology ypT3 ypN1 (2/35 lymph nodes). Discussed the GI tumor Board. He would benefit from post-adjuvant chemotherapy. A 3 months course would be appropriate is what I recall Dr. Mosetta Putt saying  Once he has completed that, plan evaluation of the ileoanal  J-pouch. If that is clean, then Loop ileostomy takedown. Most likely 4-6 weeks after completing chemotherapy it to give his body chance to recover.  ILEOSTOMY IN PLACE (Z93.2) Impression: Plan would be Loop ileostomy takedown since >6 weeks after completing chemotherapy. Would like to make sure that the Ileal J pouch is looking good.  Do a pouchoscopy prior to then with my partner Dr. Maisie Fus.  Obtain GG enema  Current Plans Pt Education - CCS Pelvic Floor Exercises (Kegels) and Dysfunction HCI (Conor Filsaime) The anatomy & physiology of the digestive tract was discussed. The pathophysiology was discussed. Possibility  of remaining with an ostomy permanently was discussed. I offered ostomy takedown. Laparoscopic & open techniques were discussed.  Risks such as bleeding, infection, abscess, leak, reoperation, possible re-ostomy, injury to other organs, hernia, heart attack, death, and other risks were discussed. I noted a good likelihood this will help address the problem. Goals of post-operative recovery were discussed as well. We will work to minimize complications. Questions were answered. The patient expresses understanding & wishes to proceed with surgery.  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  You should hear from our office's scheduling department within 5 working days about the location, date, and time of surgery. We try to make accommodations for patient's preferences in scheduling surgery, but sometimes the OR schedule or the surgeon's schedule prevents [place] from making those accommodations.  If you have not heard from our office [phone]) in 5 working days, call the office and ask for your surgeon's nurse.  If you have other questions about your diagnosis, plan, or surgery, call the office and ask for your surgeon's  nurse.  Written instructions provided Pt Education - CCS Colectomy post-op instructions: discussed with patient and provided information.  [name], M.D., F.A.C.S. Gastrointestinal and Minimally Invasive Surgery [facility] [address], [place], [place] [address] [phone] Main / Paging

## 2017-09-02 ENCOUNTER — Other Ambulatory Visit: Payer: Self-pay | Admitting: Surgery

## 2017-09-02 DIAGNOSIS — C20 Malignant neoplasm of rectum: Secondary | ICD-10-CM

## 2017-09-05 ENCOUNTER — Other Ambulatory Visit: Payer: Self-pay | Admitting: General Surgery

## 2017-09-13 ENCOUNTER — Other Ambulatory Visit: Payer: Self-pay | Admitting: Hematology

## 2017-09-13 DIAGNOSIS — C20 Malignant neoplasm of rectum: Secondary | ICD-10-CM

## 2017-09-13 DIAGNOSIS — Z1509 Genetic susceptibility to other malignant neoplasm: Secondary | ICD-10-CM

## 2017-09-14 ENCOUNTER — Ambulatory Visit
Admission: RE | Admit: 2017-09-14 | Discharge: 2017-09-14 | Disposition: A | Discharge status: Home / Self Care | Payer: Medicaid Other | Source: Ambulatory Visit | Attending: Surgery | Admitting: Surgery

## 2017-09-14 DIAGNOSIS — C20 Malignant neoplasm of rectum: Secondary | ICD-10-CM

## 2017-09-14 NOTE — Patient Instructions (Addendum)
RAMBO SARAFIAN  09/14/2017   Your procedure is scheduled on: 09-22-17   Report to [facility]  Entrance Take [employer]  Elevators to 3rd floor to  [place] at 11:30 AM.   Call this number if you have problems the morning of surgery [phone]    Remember: ONLY 1 PERSON MAY GO WITH YOU TO SHORT STAY TO GET  READY MORNING OF [place].  Do not eat food or drink liquids :After Midnight. You may also have a Clear Liquid Diet from Midnight until 7:30 AM. After 7:30 AM, nothing until after surgery.   Please eat a Clear Liquid Diet on the day of prep to prevent dehydration    CLEAR LIQUID DIET   Foods Allowed                                                                     Foods Excluded  Coffee and tea, regular and decaf                             liquids that you cannot  Plain Jell-O in any flavor                                             see through such as: Fruit ices (not with fruit pulp)                                     milk, soups, orange juice  Iced Popsicles                                    All solid food Carbonated beverages, regular and diet                                    Cranberry, grape and apple juices Sports drinks like Gatorade Lightly seasoned clear broth or consume(fat free) Sugar, honey syrup  Sample Menu Breakfast                                Lunch                                     Supper Cranberry juice                    Beef broth                            Chicken broth Jell-O  Grape juice                           Apple juice Coffee or tea                        Jell-O                                      Popsicle                                                Coffee or tea                        Coffee or tea  _____________________________________________________________________    Take these medicines the morning of surgery with A SIP OF WATER: Gabapentin  (Neurontin)                                You may not have any metal on your body including hair pins and              piercings  Do not wear jewelry, make-up, lotions, powders or perfumes, deodorant             Men may shave face and neck.   Do not bring valuables to the hospital. [place].  Contacts, dentures or bridgework may not be worn into surgery.  Leave suitcase in the car. After surgery it may be brought to your room.                  Please read over the following fact sheets you were given: _____________________________________________________________________             [facility] - Preparing for Surgery Before surgery, you can play an important role.  Because skin is not sterile, your skin needs to be as free of germs as possible.  You can reduce the number of germs on your skin by washing with CHG (chlorahexidine gluconate) soap before surgery.  CHG is an antiseptic cleaner which kills germs and bonds with the skin to continue killing germs even after washing. Please DO NOT use if you have an allergy to CHG or antibacterial soaps.  If your skin becomes reddened/irritated stop using the CHG and inform your nurse when you arrive at Short Stay. Do not shave (including legs and underarms) for at least 48 hours prior to the first CHG shower.  You may shave your face/neck. Please follow these instructions carefully:  1.  Shower with CHG Soap the night before surgery and the  morning of Surgery.  2.  If you choose to wash your hair, wash your hair first as usual with your  normal  shampoo.  3.  After you shampoo, rinse your hair and body thoroughly to remove the  shampoo.                           4.  Use CHG as you would any other  liquid soap.  You can apply chg directly  to the skin and wash                       Gently with a scrungie or clean washcloth.  5.  Apply the CHG Soap to your body ONLY FROM THE NECK DOWN.   Do not  use on face/ open                           Wound or open sores. Avoid contact with eyes, ears mouth and genitals (private parts).                       Wash face,  Genitals (private parts) with your normal soap.             6.  Wash thoroughly, paying special attention to the area where your surgery  will be performed.  7.  Thoroughly rinse your body with warm water from the neck down.  8.  DO NOT shower/wash with your normal soap after using and rinsing off  the CHG Soap.                9.  Pat yourself dry with a clean towel.            10.  Wear clean pajamas.            11.  Place clean sheets on your bed the night of your first shower and do not  sleep with pets. Day of Surgery : Do not apply any lotions/deodorants the morning of surgery.  Please wear clean clothes to the hospital/surgery center.  FAILURE TO FOLLOW THESE INSTRUCTIONS MAY RESULT IN THE CANCELLATION OF YOUR SURGERY PATIENT SIGNATURE_________________________________  NURSE SIGNATURE__________________________________  ________________________________________________________________________

## 2017-09-14 NOTE — Progress Notes (Signed)
08-18-17 (EPIC) CMP, CBC w/Diff  06-20-17 (EPIC) Chest w/contrast

## 2017-09-16 ENCOUNTER — Encounter (HOSPITAL_COMMUNITY)
Admission: RE | Admit: 2017-09-16 | Discharge: 2017-09-16 | Disposition: A | Discharge status: Home / Self Care | Payer: Medicaid Other | Source: Ambulatory Visit | Attending: Surgery | Admitting: Surgery

## 2017-09-16 ENCOUNTER — Other Ambulatory Visit: Payer: Self-pay | Admitting: *Deleted

## 2017-09-16 ENCOUNTER — Encounter (HOSPITAL_COMMUNITY): Payer: Self-pay

## 2017-09-16 DIAGNOSIS — C20 Malignant neoplasm of rectum: Secondary | ICD-10-CM | POA: Diagnosis not present

## 2017-09-16 DIAGNOSIS — Z1509 Genetic susceptibility to other malignant neoplasm: Secondary | ICD-10-CM

## 2017-09-16 DIAGNOSIS — Z01818 Encounter for other preprocedural examination: Secondary | ICD-10-CM | POA: Insufficient documentation

## 2017-09-16 HISTORY — DX: Anxiety disorder, unspecified: F41.9

## 2017-09-16 LAB — BASIC METABOLIC PANEL
Anion gap: 7 (ref 5–15)
BUN: 13 mg/dL (ref 6–20)
CALCIUM: 8.4 mg/dL — AB (ref 8.9–10.3)
CO2: 22 mmol/L (ref 22–32)
CREATININE: 0.86 mg/dL (ref 0.61–1.24)
Chloride: 110 mmol/L (ref 101–111)
GFR calc non Af Amer: >60 mL/min (ref >60)
Glucose, Bld: 96 mg/dL (ref 65–99)
Potassium: 4.5 mmol/L (ref 3.5–5.1)
SODIUM: 139 mmol/L (ref 135–145)

## 2017-09-16 LAB — CBC
HCT: 45.8 % (ref 39.0–52.0)
Hemoglobin: 15.7 g/dL (ref 13.0–17.0)
MCH: 29.5 pg (ref 26.0–34.0)
MCHC: 34.3 g/dL (ref 30.0–36.0)
MCV: 86.1 fL (ref 78.0–100.0)
PLATELETS: 172 10*3/uL (ref 150–400)
RBC: 5.32 MIL/uL (ref 4.22–5.81)
RDW: 15.2 % (ref 11.5–15.5)
WBC: 7.3 10*3/uL (ref 4.0–10.5)

## 2017-09-16 LAB — HEMOGLOBIN A1C
HEMOGLOBIN A1C: 5.5 % (ref 4.8–5.6)
Mean Plasma Glucose: 111.15 mg/dL

## 2017-09-16 MED ORDER — GABAPENTIN 100 MG PO CAPS: 300.0000 mg | ORAL_CAPSULE | Freq: Three times a day (TID) | ORAL | 0 refills | Status: DC

## 2017-09-21 ENCOUNTER — Encounter (HOSPITAL_COMMUNITY)
Admission: RE | Disposition: A | Discharge status: Home / Self Care | Payer: Self-pay | Source: Ambulatory Visit | Attending: General Surgery

## 2017-09-21 ENCOUNTER — Ambulatory Visit (HOSPITAL_COMMUNITY)
Admission: RE | Admit: 2017-09-21 | Discharge: 2017-09-21 | Disposition: A | Discharge status: Home / Self Care | Payer: Medicaid Other | Source: Ambulatory Visit | Attending: General Surgery | Admitting: General Surgery

## 2017-09-21 ENCOUNTER — Encounter (HOSPITAL_COMMUNITY): Payer: Self-pay | Admitting: *Deleted

## 2017-09-21 DIAGNOSIS — Z87891 Personal history of nicotine dependence: Secondary | ICD-10-CM | POA: Insufficient documentation

## 2017-09-21 DIAGNOSIS — Z432 Encounter for attention to ileostomy: Secondary | ICD-10-CM

## 2017-09-21 DIAGNOSIS — Z9221 Personal history of antineoplastic chemotherapy: Secondary | ICD-10-CM | POA: Insufficient documentation

## 2017-09-21 DIAGNOSIS — Z4659 Encounter for fitting and adjustment of other gastrointestinal appliance and device: Secondary | ICD-10-CM

## 2017-09-21 DIAGNOSIS — Z800 Family history of malignant neoplasm of digestive organs: Secondary | ICD-10-CM

## 2017-09-21 DIAGNOSIS — Z85048 Personal history of other malignant neoplasm of rectum, rectosigmoid junction, and anus: Secondary | ICD-10-CM | POA: Insufficient documentation

## 2017-09-21 DIAGNOSIS — K6389 Other specified diseases of intestine: Secondary | ICD-10-CM

## 2017-09-21 DIAGNOSIS — Z89422 Acquired absence of other left toe(s): Secondary | ICD-10-CM

## 2017-09-21 DIAGNOSIS — Z79899 Other long term (current) drug therapy: Secondary | ICD-10-CM | POA: Insufficient documentation

## 2017-09-21 DIAGNOSIS — F419 Anxiety disorder, unspecified: Secondary | ICD-10-CM

## 2017-09-21 DIAGNOSIS — Z9049 Acquired absence of other specified parts of digestive tract: Secondary | ICD-10-CM | POA: Insufficient documentation

## 2017-09-21 HISTORY — PX: POUCHOSCOPY: SHX6321

## 2017-09-21 SURGERY — ENDOSCOPY, POUCH, SMALL INTESTINE, DIAGNOSTIC

## 2017-09-21 MED ORDER — MIDAZOLAM HCL 5 MG/ML IJ SOLN
INTRAMUSCULAR | Status: AC
  Filled 2017-09-21: qty 2

## 2017-09-21 MED ORDER — MIDAZOLAM HCL 10 MG/2ML IJ SOLN
INTRAMUSCULAR | Status: DC | PRN
  Administered 2017-09-21: 2 mg via INTRAVENOUS
  Administered 2017-09-21: 1 mg via INTRAVENOUS

## 2017-09-21 MED ORDER — FENTANYL CITRATE (PF) 100 MCG/2ML IJ SOLN
INTRAMUSCULAR | Status: AC
  Filled 2017-09-21: qty 2

## 2017-09-21 MED ORDER — SODIUM CHLORIDE 0.9 % IV SOLN
INTRAVENOUS | Status: DC
  Administered 2017-09-21: 500 mL via INTRAVENOUS

## 2017-09-21 MED ORDER — FENTANYL CITRATE (PF) 100 MCG/2ML IJ SOLN
INTRAMUSCULAR | Status: DC | PRN
  Administered 2017-09-21: 25 ug via INTRAVENOUS

## 2017-09-21 NOTE — H&P (Signed)
46 y.o. M s/p total proctocolectomy and J pouch for rectal cancer and Lynch syndrome.  He is ~9 months s/p surgery and has completed adjuvant treatment for his cancer.  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  He is currently diverted with a loop ileostomy.  Past Medical History:  Diagnosis Date  . Anxiety   . Cancer ([place]) 12/01/2016   colon   . Family history of colon cancer   . Poor dental hygiene    Past Surgical History:  Procedure Laterality Date  . EUS N/A 07/29/2016   Procedure: LOWER ENDOSCOPIC ULTRASOUND (EUS);  Surgeon: Milus Banister, MD;  Location: [facility];  Service: Endoscopy;  Laterality: N/A;  . PORTACATH PLACEMENT N/A 01/26/2017   Procedure: PLACEMENT OF PORT-A-CATH CENTRAL LINE WITH FLUOROSCOPY AND ULTRASOUND;  Surgeon: Michael Boston, MD;  Location: [facility];  Service: General;  Laterality: N/A;  . PROCTOSCOPY N/A 12/01/2016   Procedure: RIGID PROCTOSCOPY;  Surgeon: Michael Boston, MD;  Location: [facility];  Service: General;  Laterality: N/A;  . TOE AMPUTATION Left   . XI ROBOTIC ASSISTED LOWER ANTERIOR RESECTION N/A 12/01/2016   Procedure: XI ROBOTIC ASSISTED PROCTOCOLECTOMY WITH ILLEOPOUCH ANASTAMOSIS WITH DIVERTING ILLEOSTOMY;  Surgeon: Michael Boston, MD;  Location: [facility];  Service: General;  Laterality: N/A;   Family History  Problem Relation Age of Onset  . Diabetes Mother   . COPD Father   . Colon cancer Maternal Aunt        dx in her 49s  . Diabetes Maternal Grandmother   . Brain cancer Maternal Grandfather   . Lung cancer Paternal Grandfather   . Colon cancer Cousin        dx in her 58s  . Bone cancer Sister 8  . Pancreatic cancer Neg Hx   . Esophageal cancer Neg Hx   . Stomach cancer Neg Hx   . Liver disease Neg Hx    Social History   Social History  . Marital status: Married    Spouse name: N/A  . Number of children: 3  . Years of education: N/A   Occupational History  . Not on file.   Social History Main  Topics  . Smoking status: Former Smoker    Packs/day: 1.50    Years: 30.00    Types: Cigarettes    Quit date: 12/01/2016  . Smokeless tobacco: Never Used  . Alcohol use No  . Drug use: No     Comment: patient denies  . Sexual activity: Not on file   Other Topics Concern  . Not on file   Social History Narrative   Lives with significant other, [name]   Have a 47 year old son   Smoker      [place]   Current Facility-Administered Medications for the 09/21/17 encounter [facility] Encounter)  Medication  . 0.9 %  sodium chloride infusion   Current Meds  Medication Sig  . ALPRAZolam (XANAX) 0.5 MG tablet Take 0.5 mg by mouth 2 (two) times daily as needed for anxiety or sleep.   . diphenoxylate-atropine (LOMOTIL) 2.5-0.025 MG tablet Take 1-2 tablets by mouth 4 (four) times daily as needed for diarrhea or loose stools. (Patient taking differently: Take 2 tablets by mouth 4 (four) times daily as needed for diarrhea or loose stools. )  . [DISCONTINUED] gabapentin (NEURONTIN) 100 MG capsule Take 3 capsules (300 mg total) by mouth 3 (three) times daily.  No Known Allergies Review of Systems  Constitutional: Negative for chills and fever.  HENT: Negative for hearing loss.   Eyes: Negative for blurred vision and double vision.  Respiratory: Negative for cough and shortness of breath.   Cardiovascular: Negative for chest pain and palpitations.  Gastrointestinal: Negative for abdominal pain, nausea and vomiting.  Genitourinary: Negative for dysuria, frequency and urgency.  Musculoskeletal: Negative for myalgias.  Skin: Negative for itching and rash.  Neurological: Negative for dizziness and headaches.   There were no vitals taken for this visit.   Physical Exam  Constitutional: He is oriented to person, place, and time. He appears well-developed and well-nourished.  HENT:  Head: Normocephalic and atraumatic.  Eyes: Pupils are equal, round, and reactive to  light. Conjunctivae and EOM are normal.  Neck: Normal range of motion. Neck supple.  Cardiovascular: Normal rate and regular rhythm.   Pulmonary/Chest: Effort normal.  Abdominal: Soft. He exhibits no distension. There is no tenderness.  Musculoskeletal: Normal range of motion.  Neurological: He is alert and oriented to person, place, and time.   Assessment HNPCC, s/p J pouch creation  Plan Pouchoscopy  Risks include bleeding, infection and abd pain.  These are all very minimal

## 2017-09-21 NOTE — Discharge Instructions (Signed)
Post Colonoscopy Instructions ° °1. DIET: Follow a light bland diet the first 24 hours after arrival home, such as soup, liquids, crackers, etc.  Be sure to include lots of fluids daily.  Avoid fast food or heavy meals as your are more likely to get nauseated.   °2. You may have some mild rectal bleeding for the first few days after the procedure.  This should get less and less with time.  Resume any blood thinners 2 days after your procedure unless directed otherwise by your physician. °3. Take your usually prescribed home medications unless otherwise directed. °a. If you have any pain, it is helpful to get up and walk around, as it is usually from excess gas. °b. If this is not helpful, you can take an over-the-counter pain medication.  Choose one of the following that works best for you: °i. Naproxen (Aleve, etc)  Two 220mg tabs twice a day °ii. Ibuprofen (Advil, etc) Three 200mg tabs four times a day (every meal & bedtime) °iii. If you still have pain after using one of these, please call the office °4. It is normal to not have a bowel movement for 2-3 days after colonoscopy.   ° °5. ACTIVITIES as tolerated:   °6. You may resume regular (light) daily activities beginning the next day--such as daily self-care, walking, climbing stairs--gradually increasing activities as tolerated.  ° ° °WHEN TO CALL US [phone]: °1. Fever over 101.5 F (38.5 C)  °2. Severe abdominal or chest pain  °3. Large amount of rectal bleeding, passing multiple blood clots  °4. Dizziness or shortness of breath °5. Increasing nausea or vomiting ° ° The clinic staff is available to answer your questions during regular business hours (8:30am-5pm).  Please don’t hesitate to call and ask to speak to one of our nurses for clinical concerns.  ° If you have a medical emergency, go to the nearest emergency room or call 911. ° A surgeon from [facility] is always on call at the hospitals ° ° °[facility] °[address], [address], [place], [place]  [address] ? °MAIN: [phone] ? TOLL FREE: [phone] ?  °FAX [phone] °[email or web address] ° ° °

## 2017-09-21 NOTE — Op Note (Signed)
[facility] Patient Name: [name] Procedure Date: 09/21/2017 MRN: [id number] Attending MD: Leighton Ruff , MD Date of Birth: 28-Jul-1971 CSN: [id number] Age: 46 Admit Type: Outpatient Procedure:                Pouchoscopy Indications:              History of total proctocolectomy, Personal history                            of malignant rectal neoplasm Providers:                Leighton Ruff, MD, Cleda Daub, RN, Elspeth Cho Tech., Technician Referring MD:              Medicines:                Fentanyl 25 micrograms IV, Midazolam 3 mg IV Complications:            No immediate complications. Estimated Blood Loss:     Estimated blood loss was minimal. Procedure:                Pre-Anesthesia Assessment:                           - Prior to the procedure, a History and Physical                            was performed, and patient medications and                            allergies were reviewed. The patient's tolerance of                            previous anesthesia was also reviewed. The risks                            and benefits of the procedure and the sedation                            options and risks were discussed with the patient.                            All questions were answered, and informed consent                            was obtained. Prior Anticoagulants: The patient has                            taken no previous anticoagulant or antiplatelet                            agents. ASA Grade Assessment: II - A patient with  mild systemic disease. After reviewing the risks                            and benefits, the patient was deemed in                            satisfactory condition to undergo the procedure.                           After obtaining informed consent, the endoscope was                            passed under direct vision. Throughout the   procedure, the patient's blood pressure, pulse, and                            oxygen saturations were monitored continuously. The                            EC-[id number] ([id number]) scope was introduced through                            the ileoanal anastomosis via the anus and advanced                            to the ileoanal pouch. The procedure was performed                            with ease. The patient tolerated the procedure well. Scope In: Scope Out: Findings:      The neo-terminal ileum appeared normal.      Patient is status-post total colectomy with an ileal pouch-anal       anastomosis. Impression:               - The examined portion of the ileum was normal.                           - No specimens collected. Recommendation:           - Repeat post-surgical lower GI endoscopy per                            protocol. Procedure Code(s):        --- Professional ---                           [phone], Endoscopic evaluation of small intestinal                            pouch (eg, Kock pouch, ileal reservoir [S or J]);                            diagnostic, including collection of specimen(s) by                            brushing or washing, when  performed (separate                            procedure) Diagnosis Code(s):        --- Professional ---                           Z90.49, Acquired absence of other specified parts                            of digestive tract                           Z85.048, Personal history of other malignant                            neoplasm of rectum, rectosigmoid junction, and anus CPT copyright 2016 American Medical Association. All rights reserved. The codes documented in this report are preliminary and upon coder review may  be revised to meet current compliance requirements. Leighton Ruff, MD Leighton Ruff, MD 46/01/1947 2:31:55 PM This report has been signed electronically. Number of Addenda: 0

## 2017-09-22 ENCOUNTER — Inpatient Hospital Stay (HOSPITAL_COMMUNITY)
Admission: RE | Admit: 2017-09-22 | Discharge: 2017-09-27 | DRG: 331 | Disposition: A | Discharge status: Home / Self Care | Payer: Medicaid Other | Attending: Surgery | Admitting: Surgery

## 2017-09-22 ENCOUNTER — Inpatient Hospital Stay (HOSPITAL_COMMUNITY): Payer: Medicaid Other | Admitting: Certified Registered Nurse Anesthetist

## 2017-09-22 ENCOUNTER — Encounter (HOSPITAL_COMMUNITY)
Admission: RE | Disposition: A | Discharge status: Home / Self Care | Payer: Self-pay | Source: Home / Self Care | Attending: Surgery

## 2017-09-22 ENCOUNTER — Encounter (HOSPITAL_COMMUNITY): Payer: Self-pay | Admitting: General Surgery

## 2017-09-22 DIAGNOSIS — Z800 Family history of malignant neoplasm of digestive organs: Secondary | ICD-10-CM

## 2017-09-22 DIAGNOSIS — C19 Malignant neoplasm of rectosigmoid junction: Secondary | ICD-10-CM | POA: Diagnosis present

## 2017-09-22 DIAGNOSIS — F411 Generalized anxiety disorder: Secondary | ICD-10-CM | POA: Diagnosis present

## 2017-09-22 DIAGNOSIS — K219 Gastro-esophageal reflux disease without esophagitis: Secondary | ICD-10-CM | POA: Diagnosis present

## 2017-09-22 DIAGNOSIS — Z9049 Acquired absence of other specified parts of digestive tract: Secondary | ICD-10-CM

## 2017-09-22 DIAGNOSIS — Z79899 Other long term (current) drug therapy: Secondary | ICD-10-CM

## 2017-09-22 DIAGNOSIS — Z1509 Genetic susceptibility to other malignant neoplasm: Secondary | ICD-10-CM

## 2017-09-22 DIAGNOSIS — M62838 Other muscle spasm: Secondary | ICD-10-CM | POA: Diagnosis present

## 2017-09-22 DIAGNOSIS — K6289 Other specified diseases of anus and rectum: Secondary | ICD-10-CM

## 2017-09-22 DIAGNOSIS — Z9221 Personal history of antineoplastic chemotherapy: Secondary | ICD-10-CM | POA: Diagnosis not present

## 2017-09-22 DIAGNOSIS — F419 Anxiety disorder, unspecified: Secondary | ICD-10-CM | POA: Diagnosis present

## 2017-09-22 DIAGNOSIS — Z89422 Acquired absence of other left toe(s): Secondary | ICD-10-CM

## 2017-09-22 DIAGNOSIS — K91858 Other complications of intestinal pouch: Secondary | ICD-10-CM | POA: Diagnosis present

## 2017-09-22 DIAGNOSIS — F17210 Nicotine dependence, cigarettes, uncomplicated: Secondary | ICD-10-CM | POA: Diagnosis present

## 2017-09-22 DIAGNOSIS — Z932 Ileostomy status: Secondary | ICD-10-CM

## 2017-09-22 DIAGNOSIS — C20 Malignant neoplasm of rectum: Secondary | ICD-10-CM | POA: Diagnosis present

## 2017-09-22 DIAGNOSIS — Z15060 Genetic susceptibility to colorectal cancer: Secondary | ICD-10-CM | POA: Diagnosis present

## 2017-09-22 HISTORY — PX: ILEOSTOMY CLOSURE: SHX1784

## 2017-09-22 SURGERY — CLOSURE, ILEOSTOMY
Anesthesia: General | Site: Abdomen

## 2017-09-22 MED ORDER — PROMETHAZINE HCL 25 MG/ML IJ SOLN: 6.2500 mg | INTRAMUSCULAR | Status: DC | PRN

## 2017-09-22 MED ORDER — PROPOFOL 10 MG/ML IV BOLUS
INTRAVENOUS | Status: AC
  Filled 2017-09-22: qty 20

## 2017-09-22 MED ORDER — MENTHOL 3 MG MT LOZG: 1.0000 | LOZENGE | OROMUCOSAL | Status: DC | PRN

## 2017-09-22 MED ORDER — LIDOCAINE 2% (20 MG/ML) 5 ML SYRINGE
INTRAMUSCULAR | Status: AC
  Filled 2017-09-22: qty 5

## 2017-09-22 MED ORDER — LACTATED RINGERS IV SOLN
INTRAVENOUS | Status: DC
  Administered 2017-09-22 (×2): via INTRAVENOUS

## 2017-09-22 MED ORDER — ALBUTEROL SULFATE HFA 108 (90 BASE) MCG/ACT IN AERS
INHALATION_SPRAY | RESPIRATORY_TRACT | Status: AC
  Filled 2017-09-22: qty 6.7

## 2017-09-22 MED ORDER — SACCHAROMYCES BOULARDII 250 MG PO CAPS
250.0000 mg | ORAL_CAPSULE | Freq: Two times a day (BID) | ORAL | Status: DC
  Administered 2017-09-22 – 2017-09-27 (×10): 250 mg via ORAL
  Filled 2017-09-22 (×10): qty 1

## 2017-09-22 MED ORDER — ENOXAPARIN SODIUM 40 MG/0.4ML ~~LOC~~ SOLN
40.0000 mg | SUBCUTANEOUS | Status: DC
  Administered 2017-09-23 – 2017-09-27 (×5): 40 mg via SUBCUTANEOUS
  Filled 2017-09-22 (×5): qty 0.4

## 2017-09-22 MED ORDER — GUAIFENESIN-DM 100-10 MG/5ML PO SYRP: 10.0000 mL | ORAL_SOLUTION | ORAL | Status: DC | PRN

## 2017-09-22 MED ORDER — DEXTROSE 5 % IV SOLN
2.0000 g | Freq: Two times a day (BID) | INTRAVENOUS | Status: AC
  Administered 2017-09-22: 2 g via INTRAVENOUS
  Filled 2017-09-22: qty 2

## 2017-09-22 MED ORDER — HYDROMORPHONE HCL-NACL 0.5-0.9 MG/ML-% IV SOSY
PREFILLED_SYRINGE | INTRAVENOUS | Status: AC
  Filled 2017-09-22: qty 4

## 2017-09-22 MED ORDER — LIDOCAINE 2% (20 MG/ML) 5 ML SYRINGE
INTRAMUSCULAR | Status: DC | PRN
  Administered 2017-09-22: 1.5 mg/kg/h via INTRAVENOUS

## 2017-09-22 MED ORDER — ALBUTEROL SULFATE HFA 108 (90 BASE) MCG/ACT IN AERS
INHALATION_SPRAY | RESPIRATORY_TRACT | Status: DC | PRN
  Administered 2017-09-22: 5 via RESPIRATORY_TRACT

## 2017-09-22 MED ORDER — CELECOXIB 200 MG PO CAPS
400.0000 mg | ORAL_CAPSULE | ORAL | Status: AC
  Administered 2017-09-22: 400 mg via ORAL
  Filled 2017-09-22: qty 2

## 2017-09-22 MED ORDER — METOPROLOL TARTRATE 5 MG/5ML IV SOLN: 5.0000 mg | Freq: Four times a day (QID) | INTRAVENOUS | Status: DC | PRN

## 2017-09-22 MED ORDER — ALPRAZOLAM 0.5 MG PO TABS
0.5000 mg | ORAL_TABLET | Freq: Two times a day (BID) | ORAL | Status: DC | PRN
  Administered 2017-09-25 (×2): 0.5 mg via ORAL
  Filled 2017-09-22 (×2): qty 1

## 2017-09-22 MED ORDER — ALVIMOPAN 12 MG PO CAPS
12.0000 mg | ORAL_CAPSULE | Freq: Two times a day (BID) | ORAL | Status: DC
  Administered 2017-09-23 – 2017-09-24 (×3): 12 mg via ORAL
  Filled 2017-09-22 (×3): qty 1

## 2017-09-22 MED ORDER — DIPHENHYDRAMINE HCL 12.5 MG/5ML PO ELIX: 12.5000 mg | ORAL_SOLUTION | Freq: Four times a day (QID) | ORAL | Status: DC | PRN

## 2017-09-22 MED ORDER — ONDANSETRON HCL 4 MG PO TABS: 4.0000 mg | ORAL_TABLET | Freq: Four times a day (QID) | ORAL | Status: DC | PRN

## 2017-09-22 MED ORDER — HYDRALAZINE HCL 20 MG/ML IJ SOLN: 5.0000 mg | Freq: Four times a day (QID) | INTRAMUSCULAR | Status: DC | PRN

## 2017-09-22 MED ORDER — LACTATED RINGERS IV BOLUS (SEPSIS): 1000.0000 mL | Freq: Three times a day (TID) | INTRAVENOUS | Status: AC | PRN

## 2017-09-22 MED ORDER — BISMUTH SUBSALICYLATE 262 MG/15ML PO SUSP: 30.0000 mL | Freq: Three times a day (TID) | ORAL | Status: DC | PRN

## 2017-09-22 MED ORDER — LABETALOL HCL 5 MG/ML IV SOLN
INTRAVENOUS | Status: AC
  Administered 2017-09-22: 5 mg
  Filled 2017-09-22: qty 4

## 2017-09-22 MED ORDER — METOCLOPRAMIDE HCL 5 MG/ML IJ SOLN: 5.0000 mg | Freq: Four times a day (QID) | INTRAMUSCULAR | Status: DC | PRN

## 2017-09-22 MED ORDER — SODIUM CHLORIDE 0.9 % IV SOLN
INTRAVENOUS | Status: DC
  Administered 2017-09-22: 18:00:00 via INTRAVENOUS
  Administered 2017-09-22: 1000 mL via INTRAVENOUS

## 2017-09-22 MED ORDER — HYDROCORTISONE 2.5 % RE CREA: 1.0000 "application " | TOPICAL_CREAM | Freq: Four times a day (QID) | RECTAL | Status: DC | PRN

## 2017-09-22 MED ORDER — PHENYLEPHRINE HCL 10 MG/ML IJ SOLN
INTRAMUSCULAR | Status: DC | PRN
  Administered 2017-09-22: 40 ug via INTRAVENOUS

## 2017-09-22 MED ORDER — ONDANSETRON HCL 4 MG/2ML IJ SOLN: 4.0000 mg | Freq: Four times a day (QID) | INTRAMUSCULAR | Status: DC | PRN

## 2017-09-22 MED ORDER — EPHEDRINE 5 MG/ML INJ
INTRAVENOUS | Status: AC
  Filled 2017-09-22: qty 10

## 2017-09-22 MED ORDER — 0.9 % SODIUM CHLORIDE (POUR BTL) OPTIME
TOPICAL | Status: DC | PRN
  Administered 2017-09-22: 2000 mL

## 2017-09-22 MED ORDER — MIDAZOLAM HCL 2 MG/2ML IJ SOLN
INTRAMUSCULAR | Status: AC
  Filled 2017-09-22: qty 2

## 2017-09-22 MED ORDER — LIP MEDEX EX OINT
1.0000 "application " | TOPICAL_OINTMENT | Freq: Two times a day (BID) | CUTANEOUS | Status: DC
  Administered 2017-09-22 – 2017-09-27 (×10): 1 via TOPICAL
  Filled 2017-09-22 (×4): qty 7

## 2017-09-22 MED ORDER — EPHEDRINE SULFATE 50 MG/ML IJ SOLN
INTRAMUSCULAR | Status: DC | PRN
Start: 2017-09-22 — End: 2017-09-22
  Administered 2017-09-22 (×2): 10 mg via INTRAVENOUS

## 2017-09-22 MED ORDER — GABAPENTIN 300 MG PO CAPS
300.0000 mg | ORAL_CAPSULE | Freq: Three times a day (TID) | ORAL | Status: DC
  Administered 2017-09-22 – 2017-09-27 (×14): 300 mg via ORAL
  Filled 2017-09-22 (×14): qty 1

## 2017-09-22 MED ORDER — HYDROMORPHONE HCL-NACL 0.5-0.9 MG/ML-% IV SOSY
0.2500 mg | PREFILLED_SYRINGE | INTRAVENOUS | Status: DC | PRN
  Administered 2017-09-22 (×4): 0.5 mg via INTRAVENOUS

## 2017-09-22 MED ORDER — CEFOTETAN DISODIUM-DEXTROSE 2-2.08 GM-% IV SOLR
2.0000 g | INTRAVENOUS | Status: AC
  Administered 2017-09-22: 2 g via INTRAVENOUS
  Filled 2017-09-22: qty 50

## 2017-09-22 MED ORDER — ROCURONIUM BROMIDE 50 MG/5ML IV SOSY
PREFILLED_SYRINGE | INTRAVENOUS | Status: AC
  Filled 2017-09-22: qty 5

## 2017-09-22 MED ORDER — BUPIVACAINE-EPINEPHRINE 0.25% -1:200000 IJ SOLN
INTRAMUSCULAR | Status: DC | PRN
  Administered 2017-09-22: 50 mL

## 2017-09-22 MED ORDER — BUPIVACAINE LIPOSOME 1.3 % IJ SUSP
INTRAMUSCULAR | Status: DC | PRN
  Administered 2017-09-22: 20 mL

## 2017-09-22 MED ORDER — SUGAMMADEX SODIUM 500 MG/5ML IV SOLN
INTRAVENOUS | Status: DC | PRN
  Administered 2017-09-22: 300 mg via INTRAVENOUS

## 2017-09-22 MED ORDER — METHOCARBAMOL 750 MG PO TABS: 750.0000 mg | ORAL_TABLET | Freq: Four times a day (QID) | ORAL | 2 refills | Status: DC | PRN

## 2017-09-22 MED ORDER — FENTANYL CITRATE (PF) 100 MCG/2ML IJ SOLN
INTRAMUSCULAR | Status: DC | PRN
  Administered 2017-09-22 (×3): 50 ug via INTRAVENOUS
  Administered 2017-09-22: 100 ug via INTRAVENOUS

## 2017-09-22 MED ORDER — ENOXAPARIN SODIUM 40 MG/0.4ML ~~LOC~~ SOLN
40.0000 mg | Freq: Once | SUBCUTANEOUS | Status: AC
  Administered 2017-09-22: 40 mg via SUBCUTANEOUS
  Filled 2017-09-22: qty 0.4

## 2017-09-22 MED ORDER — DIPHENOXYLATE-ATROPINE 2.5-0.025 MG PO TABS
1.0000 | ORAL_TABLET | Freq: Four times a day (QID) | ORAL | Status: DC | PRN
  Administered 2017-09-23: 2 via ORAL
  Filled 2017-09-22: qty 2

## 2017-09-22 MED ORDER — ZOLPIDEM TARTRATE 5 MG PO TABS: 5.0000 mg | ORAL_TABLET | Freq: Every evening | ORAL | Status: DC | PRN

## 2017-09-22 MED ORDER — TRAMADOL HCL 50 MG PO TABS
50.0000 mg | ORAL_TABLET | Freq: Four times a day (QID) | ORAL | Status: DC | PRN
  Administered 2017-09-22 – 2017-09-27 (×10): 100 mg via ORAL
  Filled 2017-09-22 (×10): qty 2

## 2017-09-22 MED ORDER — BUPIVACAINE-EPINEPHRINE 0.25% -1:200000 IJ SOLN
INTRAMUSCULAR | Status: AC
  Filled 2017-09-22: qty 1

## 2017-09-22 MED ORDER — DIPHENHYDRAMINE HCL 50 MG/ML IJ SOLN: 12.5000 mg | Freq: Four times a day (QID) | INTRAMUSCULAR | Status: DC | PRN

## 2017-09-22 MED ORDER — DEXAMETHASONE SODIUM PHOSPHATE 10 MG/ML IJ SOLN
INTRAMUSCULAR | Status: DC | PRN
  Administered 2017-09-22: 10 mg via INTRAVENOUS

## 2017-09-22 MED ORDER — LIDOCAINE HCL (CARDIAC) 20 MG/ML IV SOLN
INTRAVENOUS | Status: DC | PRN
Start: 2017-09-22 — End: 2017-09-22
  Administered 2017-09-22: 80 mg via INTRAVENOUS

## 2017-09-22 MED ORDER — LABETALOL HCL 5 MG/ML IV SOLN
5.0000 mg | INTRAVENOUS | Status: DC | PRN
  Administered 2017-09-22: 5 mg via INTRAVENOUS

## 2017-09-22 MED ORDER — ALVIMOPAN 12 MG PO CAPS
12.0000 mg | ORAL_CAPSULE | Freq: Once | ORAL | Status: AC
  Administered 2017-09-22: 12 mg via ORAL
  Filled 2017-09-22: qty 1

## 2017-09-22 MED ORDER — ALUM & MAG HYDROXIDE-SIMETH 200-200-20 MG/5ML PO SUSP
30.0000 mL | Freq: Four times a day (QID) | ORAL | Status: DC | PRN
  Administered 2017-09-22: 30 mL via ORAL
  Filled 2017-09-22: qty 30

## 2017-09-22 MED ORDER — HYDROCORTISONE 1 % EX CREA: 1.0000 "application " | TOPICAL_CREAM | Freq: Three times a day (TID) | CUTANEOUS | Status: DC | PRN

## 2017-09-22 MED ORDER — PROPOFOL 10 MG/ML IV BOLUS
INTRAVENOUS | Status: DC | PRN
  Administered 2017-09-22: 175 mg via INTRAVENOUS

## 2017-09-22 MED ORDER — GABAPENTIN 300 MG PO CAPS
300.0000 mg | ORAL_CAPSULE | ORAL | Status: AC
  Administered 2017-09-22: 300 mg via ORAL
  Filled 2017-09-22: qty 1

## 2017-09-22 MED ORDER — SODIUM CHLORIDE 0.9 % IV SOLN
500.0000 mL | INTRAVENOUS | Status: DC
  Administered 2017-09-22: 500 mL via INTRAVENOUS

## 2017-09-22 MED ORDER — PHENOL 1.4 % MT LIQD: 1.0000 | OROMUCOSAL | Status: DC | PRN

## 2017-09-22 MED ORDER — ACETAMINOPHEN 500 MG PO TABS
1000.0000 mg | ORAL_TABLET | ORAL | Status: AC
  Administered 2017-09-22: 1000 mg via ORAL
  Filled 2017-09-22: qty 2

## 2017-09-22 MED ORDER — ENSURE SURGERY PO LIQD
237.0000 mL | Freq: Two times a day (BID) | ORAL | Status: DC
  Administered 2017-09-23 – 2017-09-27 (×8): 237 mL via ORAL
  Filled 2017-09-22 (×11): qty 237

## 2017-09-22 MED ORDER — HYDROMORPHONE HCL 1 MG/ML IJ SOLN
0.5000 mg | INTRAMUSCULAR | Status: DC | PRN
  Administered 2017-09-23 – 2017-09-26 (×11): 1 mg via INTRAVENOUS
  Filled 2017-09-22 (×8): qty 1
  Filled 2017-09-22: qty 2
  Filled 2017-09-22 (×2): qty 1

## 2017-09-22 MED ORDER — LACTATED RINGERS IV SOLN: 1000.0000 mL | Freq: Three times a day (TID) | INTRAVENOUS | Status: AC | PRN

## 2017-09-22 MED ORDER — FENTANYL CITRATE (PF) 250 MCG/5ML IJ SOLN
INTRAMUSCULAR | Status: AC
  Filled 2017-09-22: qty 5

## 2017-09-22 MED ORDER — MIDAZOLAM HCL 5 MG/5ML IJ SOLN
INTRAMUSCULAR | Status: DC | PRN
  Administered 2017-09-22 (×2): 1 mg via INTRAVENOUS

## 2017-09-22 MED ORDER — ROCURONIUM BROMIDE 100 MG/10ML IV SOLN
INTRAVENOUS | Status: DC | PRN
  Administered 2017-09-22: 10 mg via INTRAVENOUS
  Administered 2017-09-22: 50 mg via INTRAVENOUS

## 2017-09-22 MED ORDER — ACETAMINOPHEN 500 MG PO TABS
1000.0000 mg | ORAL_TABLET | Freq: Three times a day (TID) | ORAL | Status: DC
  Administered 2017-09-22 – 2017-09-27 (×14): 1000 mg via ORAL
  Filled 2017-09-22 (×15): qty 2

## 2017-09-22 MED ORDER — TRAMADOL HCL 50 MG PO TABS: 50.0000 mg | ORAL_TABLET | Freq: Four times a day (QID) | ORAL | 0 refills | Status: DC | PRN

## 2017-09-22 MED ORDER — PROCHLORPERAZINE EDISYLATE 5 MG/ML IJ SOLN: 5.0000 mg | INTRAMUSCULAR | Status: DC | PRN

## 2017-09-22 MED ORDER — BUPIVACAINE LIPOSOME 1.3 % IJ SUSP
20.0000 mL | INTRAMUSCULAR | Status: DC
  Filled 2017-09-22: qty 20

## 2017-09-22 SURGICAL SUPPLY — 46 items
BLADE SURG SZ10 CARB STEEL (BLADE) ×4 IMPLANT
CHLORAPREP W/TINT 26ML (MISCELLANEOUS) ×4 IMPLANT
COVER MAYO STAND STRL (DRAPES) ×4 IMPLANT
DECANTER SPIKE VIAL GLASS SM (MISCELLANEOUS) ×4 IMPLANT
DRAIN CHANNEL 19F RND (DRAIN) IMPLANT
DRAPE LAPAROSCOPIC ABDOMINAL (DRAPES) ×4 IMPLANT
DRAPE SHEET LG 3/4 BI-LAMINATE (DRAPES) IMPLANT
DRAPE UTILITY XL STRL (DRAPES) ×4 IMPLANT
DRAPE WARM FLUID 44X44 (DRAPE) ×4 IMPLANT
DRSG OPSITE POSTOP 4X10 (GAUZE/BANDAGES/DRESSINGS) IMPLANT
DRSG OPSITE POSTOP 4X6 (GAUZE/BANDAGES/DRESSINGS) ×3 IMPLANT
DRSG OPSITE POSTOP 4X8 (GAUZE/BANDAGES/DRESSINGS) IMPLANT
DRSG TEGADERM 2-3/8X2-3/4 SM (GAUZE/BANDAGES/DRESSINGS) ×8 IMPLANT
DRSG TEGADERM 4X4.75 (GAUZE/BANDAGES/DRESSINGS) ×4 IMPLANT
ELECT PENCIL ROCKER SW 15FT (MISCELLANEOUS) ×4 IMPLANT
ELECT REM PT RETURN 15FT ADLT (MISCELLANEOUS) ×4 IMPLANT
GAUZE SPONGE 4X4 12PLY STRL (GAUZE/BANDAGES/DRESSINGS) ×4 IMPLANT
GLOVE ECLIPSE 8.0 STRL XLNG CF (GLOVE) ×4 IMPLANT
GLOVE INDICATOR 8.0 STRL GRN (GLOVE) ×4 IMPLANT
GOWN STRL REUS W/TWL XL LVL3 (GOWN DISPOSABLE) ×12 IMPLANT
HANDLE SUCTION POOLE (INSTRUMENTS) ×2 IMPLANT
KIT BASIN OR (CUSTOM PROCEDURE TRAY) ×4 IMPLANT
LEGGING LITHOTOMY PAIR STRL (DRAPES) ×4 IMPLANT
PACK GENERAL/GYN (CUSTOM PROCEDURE TRAY) ×4 IMPLANT
SPONGE LAP 18X18 X RAY DECT (DISPOSABLE) IMPLANT
STAPLER GUN LINEAR PROX 60 (STAPLE) ×3 IMPLANT
STAPLER PROXIMATE 75MM BLUE (STAPLE) ×3 IMPLANT
STAPLER VISISTAT 35W (STAPLE) ×4 IMPLANT
SUCTION POOLE HANDLE (INSTRUMENTS) ×4
SUT MNCRL AB 4-0 PS2 18 (SUTURE) ×4 IMPLANT
SUT PDS AB 1 CTX 36 (SUTURE) ×8 IMPLANT
SUT PDS AB 1 TP1 96 (SUTURE) IMPLANT
SUT SILK 2 0 (SUTURE) ×4
SUT SILK 2 0 SH CR/8 (SUTURE) ×4 IMPLANT
SUT SILK 2-0 18XBRD TIE 12 (SUTURE) ×2 IMPLANT
SUT SILK 3 0 (SUTURE) ×4
SUT SILK 3 0 SH CR/8 (SUTURE) ×4 IMPLANT
SUT SILK 3-0 18XBRD TIE 12 (SUTURE) ×2 IMPLANT
SUT VIC AB 2-0 SH 27 (SUTURE) ×4
SUT VIC AB 2-0 SH 27X BRD (SUTURE) ×1 IMPLANT
SYR BULB IRRIGATION 50ML (SYRINGE) ×4 IMPLANT
TAPE UMBILICAL COTTON 1/8X30 (MISCELLANEOUS) ×4 IMPLANT
TOWEL OR 17X26 10 PK STRL BLUE (TOWEL DISPOSABLE) ×8 IMPLANT
TOWEL OR NON WOVEN STRL DISP B (DISPOSABLE) ×8 IMPLANT
TRAY FOLEY W/METER SILVER 16FR (SET/KITS/TRAYS/PACK) IMPLANT
YANKAUER SUCT BULB TIP 10FT TU (MISCELLANEOUS) ×4 IMPLANT

## 2017-09-22 NOTE — Op Note (Signed)
09/22/2017  3:02 PM  PATIENT:  [name]  46 y.o. male  Patient Care Team: Patient, No Pcp Per as PCP - General ([place]) Michael Boston, MD as Consulting Physician (General Surgery) [name], Kirke Corin, MD as Consulting Physician (Gastroenterology) Kyung Rudd, MD as Consulting Physician (Radiation Oncology) Truitt Merle, MD as Consulting Physician (Oncology)  PRE-OPERATIVE DIAGNOSIS:  [facility] SYNDROME WITH RECTAL CANCER status post proctocolectomy with ileoanal anastomosis and diverting loop ileostomy.  POST-OPERATIVE DIAGNOSIS:  [facility] SYNDROME WITH RECTAL CANCER status post proctocolectomy with ileoanal anastomosis and diverting loop ileostomy.  PROCEDURE:   TAKEDOWN OF LOOP ILEOSTOMY  SURGEON:  Adin Hector, MD  ASSISTANT: Quincy Sheehan, [name], [employer]    ANESTHESIA:   local and general  EBL:  Total I/O In: -  Out: 25 [Blood:25]  Delay start of Pharmacological VTE agent (>24hrs) due to surgical blood loss or risk of bleeding:  no  DRAINS: none   SPECIMEN:  Source of Specimen:  LOOP ILEOSTOMY  DISPOSITION OF SPECIMEN:  PATHOLOGY  COUNTS:  YES  PLAN OF CARE: Admit to inpatient   PATIENT DISPOSITION:  PACU - hemodynamically stable.  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  Completed chemotherapy and is interested in ileostomy takedown. Contrast enema and pouchoscopy negative for any leak or abnormalities. He wished to proceed.   The patient has recovered from that surgery with the anastomosis well-healed.  It was felt safe to have the loop ileostomy taken down.  I discussed the procedure with the patient:  The anatomy & physiology of the digestive tract was discussed.  The pathophysiology was discussed.  Possibility of remaining with an ostomy permanently was discussed.  I offered ostomy takedown.  Laparoscopic &  open techniques were discussed.   Risks such as bleeding, infection, abscess, leak, reoperation, possible re-ostomy, injury to other organs, hernia, heart attack, death, and other risks were discussed.   I noted a good likelihood this will help address the problem.  Goals of post-operative recovery were discussed as well.  We will work to minimize complications.  Questions were answered.  The patient expresses understanding & wishes to proceed with surgery.  OR FINDINGS:  Normal anatomy.  DESCRIPTION:   Informed consent was confirmed.   The patient received IV antibiotics & underwent general anesthesia without any difficulty.  Foley catheter was sterilely placed. SCDs were active during the entire case.  The abdomen was prepped and draped in a sterile fashion.  A surgical timeout confirmed our plan.  I made a biconcave curvilinear incision transversely around the loop ileostomy.  I got into the subcutaneous tissues.  I used careful focused right angle dissection and sharp dissection.  Some focused cautery dissection as well.  That helped to free adhesions to the subcutaneous tisses & fascia.  Gradually, I was able to enter into the peritoneum focally.  I did a gentle finger sweep.  Gradually came around circumferentially and freed the loop of ileum from remaining adhesions to the abdominal wall.  We were able eviscerate some bowel proximally and distally.    I did a side-to-side stapled anastomosis using a 75 GIA.  Silk stitch placed at the crotch of the anastomosis. I used a TX stapler to staple off the common defect and resect the remaining ileum, most of it that involve the intra-abdominal ostomy component given the adhesions. . Took the mesentery with clamps and silk ties.  I closed the mesenteric defect using interrupted silk stitches.  Used the mesentery to help cover and protect the TX staple line.  The anastomosis looked healthy and viable.   We returned the anastomosis into the abdominal cavity.   Finger sweep circumferentially noted no adhesions.  It rested well.  We changed gloves and instruments.  Irrigated copiously into the wound and fascia.  I reapproximated the fascia transversely using #1 PDS stitches.  I irrigated into the subcutaneous tissues.   I did to a layer of interrupted 2-0 Vicryl deep dermal sutures to help close the wound and dead space down.  I reapproximated the skin using 4-0 Monocryl stitch running centrally.  I excised the ends of the closure to remove dog-earing.  I left the corners open.  I packed the corners with anitbiotic soaked umbilical tape for wicks.  Sterile dressing was applied.  Patient was extubated and is stable in the recovery room.  I discussed operative findings, updated the patient's status, discussed probable steps to recovery, and gave postoperative recommendations to the patient's significant other.  Recommendations were made.  Questions were answered.  She expressed understanding & appreciation.   Adin Hector, M.D., F.A.C.S. Gastrointestinal and Minimally Invasive Surgery [facility] [address] [place], [place] [address] [phone] Main / Paging

## 2017-09-22 NOTE — Anesthesia Procedure Notes (Signed)
Performed by: SINGER, JAMES       

## 2017-09-22 NOTE — Anesthesia Procedure Notes (Signed)
Procedure Name: Intubation Date/Time: 09/22/2017 1:34 PM Performed by: Duane Boston Pre-anesthesia Checklist: Patient identified, Emergency Drugs available, Suction available, Patient being monitored and Timeout performed Patient Re-evaluated:Patient Re-evaluated prior to induction Oxygen Delivery Method: Circle system utilized Preoxygenation: Pre-oxygenation with 100% oxygen Induction Type: IV induction Ventilation: Mask ventilation without difficulty Laryngoscope Size: Mac and 4 Grade View: Grade I Tube type: Oral Tube size: 7.5 mm Number of attempts: 1 Airway Equipment and Method: Stylet Placement Confirmation: ETT inserted through vocal cords under direct vision,  positive ETCO2 and breath sounds checked- equal and bilateral Secured at: 21 cm Tube secured with: Tape Dental Injury: Teeth and Oropharynx as per pre-operative assessment

## 2017-09-22 NOTE — Transfer of Care (Signed)
Immediate Anesthesia Transfer of Care Note  Patient: [name]  Procedure(s) Performed: TAKEDOWN OF LOOP ILEOSTOMY (N/A Abdomen)  Patient Location: PACU  Anesthesia Type:General  Level of Consciousness: awake, drowsy, patient cooperative, confused, lethargic and responds to stimulation  Airway & Oxygen Therapy: Patient Spontanous Breathing and Patient connected to face mask oxygen  Post-op Assessment: Report given to RN, Post -op Vital signs reviewed and stable and Patient moving all extremities  Post vital signs: Reviewed and stable  Last Vitals:  Vitals:   09/22/17 1133 09/22/17 1515  BP: 122/75   Pulse: 63   Resp: 18   Temp: 36.5 C (P) 36.7 C  SpO2: 98%     Last Pain:  Vitals:   09/22/17 1133  TempSrc: Oral         Complications: No apparent anesthesia complications

## 2017-09-22 NOTE — H&P (Signed)
[name] 09/22/2017  Location: [facility] Patient #: [id number] DOB: 04-May-1971 Married / Language: [name] / Race: White Male  Patient Care Team: Patient, No Pcp Per as PCP - General (General Practice) Michael Boston, MD as Consulting Physician (General Surgery) [name], Kirke Corin, MD as Consulting Physician (Gastroenterology) Kyung Rudd, MD as Consulting Physician (Radiation Oncology) Truitt Merle, MD as Consulting Physician (Oncology)    History of Present Illness  The patient is a 46 year old male who presents with colorectal cancer. 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  chemotherapy Adjuvant chemo CAPOX, changed to FOLFOX from cycle 2 due to poor tolerance, a total of 4.5 months therapy. 01/07/2017 - 05/26/2017  ` ` ` Patient comes today with his wife. He is feeling better. He got worsening diarrhea on the first round of oral Xeloda chemotherapy. Switched to FOLFOX IV chemotherapy. Had some bad diarrhea with the chemotherapy but now emptying the bag about 4-5 times a day. Gained 15 lbs. Using Miralax low dose. Lomotil 6 tabs/day total (2 TID). empties bag 4-5x a day (1/2 full). Appetite good. Energy level good. Getting occasional IV fluids through Dr. Burr Medico. Last lab values last week showed normal electrolytes. No nausea or vomiting. Minimal anal drainage. Not wearing diapers. He's urinating fine. Not lightheaded or dizzy. Unfortunately has started smoking again. He promises that he is only doing 2-3 cigarettes a day now. No fevers chills or sweats.  ` ` ` `   12/01/2016  PATIENT: [name] 46 y.o. 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)  PROCEDURE:  XI ROBOTIC ASSISTED PROCTOCOLECTOMY ILEAL "J" POUCH ANAL ANASTAMOSIS (IPAA) DIVERTING LOOP ILEOSTOMY RIGID PROCTOSCOPY  SURGEON: Adin Hector, MD, FACS, MASCRS  ASSISTANT: Leighton Ruff, MD, FACS, FASCRS  ANESTHESIA: General, Local anesthetic as a field block and anorectal and perianal field block}  EBL: Total I/O In: 4250 [I.V.:4000; IV Piggyback:250] Out: 700 [Urine:500; Blood:200] "see anesthesia record"  Delay start of Pharmacological VTE agent (>24hrs) due to surgical blood loss or risk of bleeding: no  DRAINS: 19 Fr Blake drain in the pelvis  SPECIMEN: Source of Specimen: 1. COLON AND RECTUM 2. DISTAL ANASTOMOTIC RING  DISPOSITION OF SPECIMEN: PATHOLOGY  COUNTS: YES  PLAN OF CARE: Admit to inpatient  PATIENT DISPOSITION: PACU - hemodynamically stable.  INDICATION:   Patient with bowel changes and found to have very distal bulky rectal cancer. Genetic studies showed Lynch syndrome. Recommend patient made for low anterior resection. 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 Offered ileal pouch anal anastomosis with diverting loop ileostomy. Possible permanent ileostomy discussed.   The anatomy & physiology of the digestive tract was discussed. The pathophysiology was discussed. Natural history risks without surgery was discussed. I worked to give an overview of the disease and the frequent need to have multispecialty involvement. I feel the risks of no intervention will lead to serious problems that outweigh  the  operative risks; therefore, I recommended a partial colectomy to remove the pathology. Laparoscopic & open techniques were discussed.   Risks such as bleeding, infection, abscess, leak, reoperation, possible ostomy, hernia, impotence, incontinence, heart attack, death, and other risks were discussed. I noted a good likelihood this will help address the problem. Goals of post-operative recovery were discussed as well. We will work to minimize complications. Educational materials on the pathology had been given in the office. Questions were answered.   The patient expressed understanding & wished to proceed with surgery.  OR FINDINGS:  Patient had a bulky scarring consistent with rectal cancer involving the distal half of the rectum. Scar 3-4cm from the anal verge.  No obvious metastatic disease on visceral parietal peritoneum or liver.  He has a ileal J-pouch. 15 cm long. 25 EEA stapled to the anal canal. Ring at just above the sphincters. Small anterior leak. The anastomosis rests 0 cm from the anal verge by rigid proctoscopy.  Diverting loop ileostomy.    PATHOLOGY: FINAL DIAGNOSIS Diagnosis 1. Colon, total resection (incl lymph nodes), proctocolectomy - SCATTERED SMALL RESIDUAL FOCI (EACH LESS THAN 0.3 CM) OF INVASIVE MODERATELY DIFFERENTIATED ADENOCARCINOMA EMBEDDED IN SUBMUCOSAL TISSUE WITH EXTENSIVE NEOADJUVANT EFFECT. - TUMOR IS PRESENT WITHIN A REGION MEASURING APPROXIMATELY 2.5 CM IN GREATEST DIMENSION. - ADENOCARCINOMA INVADES THROUGH MUSCULARIS MUCOSA AND INVOLVES SUBSEROSAL SOFT TISSUE. - MARGINS ARE NEGATIVE. - TWO OF THIRTY-FIVE LYMPH NODES ARE POSITIVE FOR METASTATIC ADENOCARCINOMA (2/35). - LYMPH NODES ALSO DEMONSTRATE VARIABLE DEGREES OF NEOADJUVANT EFFECT. - TWO CALCIFIED AND NECROTIC SOFT TISSUE NODULES ARE ALSO PRESENT WITHOUT VIABLE TUMOR SEEN. - SEE ONCOLOGY TEMPLATE. 2. Colon, resection margin (donut), distal ring final distal margin - BENIGN  ANAL MUCOSA. - NO DYSPLASIA OR TUMOR SEEN. Microscopic Comment 1. COLON AND RECTUM (INCLUDING TRANS-ANAL RESECTION): Specimen: Colon and rectum with attached appendix. Procedure: Robotic assisted proctocolectomy. Tumor site: Rectum. Specimen integrity: Intact. Macroscopic intactness of mesorectum: Near complete: The mesorectum is near complete with coning at the distal margin. Macroscopic tumor perforation: No. Invasive tumor: Maximum size: There is a 2.5 cm area of ulcerated colon with neoadjuvant changes and scattered small (less than 0.3 cm) foci of tumor. Histologic type(s): Invasive adenocarcinoma. Histologic grade and differentiation: G2: moderately differentiated/low grade. Type of polyp in which invasive carcinoma arose: Precursor polyp is not identified. Microscopic extension of invasive tumor: Tumor invades through muscularis propria to involve subserosal soft tissues. 1 of 3 FINAL for Dickmann, Keano T [phone]) Microscopic Comment(continued) Lymph-Vascular invasion: Lymph/vascular invasion is not identified; however, several lymph nodes are positive for metastatic tumor, see below. Peri-neural invasion: Not identified. Tumor deposit(s) (discontinuous extramural extension): No viable tumor deposits are identified. Resection margins (transanal resection margins): Proximal margin: Negative. Distal margin: Negative. Circumferential (radial) (posterior ascending, posterior descending; lateral and posterior mid-rectum; and entire lower 1/3 rectum): Negative. Mucosal margin: Negative. Distance closest margin (if all above margins negative): 0.5 cm (radial / circumferential soft tissue margin in distal rectum). Distance closest mucosal margin (if negative): Greater than 0.6 cm (based on amount of tissue within cassettes adjacent to distal stapled margin). Treatment effect (neo-adjuvant therapy): Prominent treatment effect is identified within the tumor and within the lymph  nodes. Additional polyp(s): No additional polyps identified. Non-neoplastic findings: Diverticulosis. Lymph nodes: number examined 35; number positive: 2. Pathologic Staging: ypT3, ypN1b. Ancillary studies: As the patient is status post neoadjuvant therapy and there are only small residual foci of tumor, additional studies will not be performed unless otherwise requested. (RH:ecj 12/06/2016) Willeen Niece MD Pathologist,  Electronic Signature (Case signed 12/06/2016) Specimen Allure Greaser and Clinical Information Specimen(s) Obtained: 1. Colon, total resection (incl lymph nodes), proctocolectomy 2. Colon, resection margin (donut), distal ring final distal margin Specimen Clinical Information 1. low rectal cancer (kp) [name] 1. Specimen: Clinically proctocolectomy, which includes portion of terminal ileum, and attached appendix. There is at least portion distal 1/3 of rectum. Specimen integrity: Intact Specimen length: Portion of terminal ileum is 4 cm and the colon is 152 cm from cecal to distal margin Mesorectal intactness: The mesorectum is near complete, with coning at the distal margin. There is muscle visible around the distal 3 cm. This area (mesorectal envelop) is inked black. Tumor location: Distal 1/3 of rectum Tumor size: Along predominantly the posterior wall to left lateral wall is a 1.5 cm in length and 2.5 cm in width, and up to 0.5 cm deep cavitary scarred area of tan red granular flattened mucosa. Residual lesion is not identified. Percent of bowel circumference involved: Approximately 25 to 30% Tumor distance to margins: Proximal: 154.5 cm Distal: The left lateral edge of the scar is focally involved with the stapled distal margin. 2 of 3 FINAL for Liberati, Gino T [phone]) Mikinzie Maciejewski(continued) Radial (posterior ascending, posterior descending; lateral and posterior mid-rectum; and entire lower 1/3 rectum): The scar is 0.8 cm from the inked perirectal soft tissue  margin. Macroscopic extent of tumor invasion: There is no Saranne Crislip lesion to evaluate invasion. Within the wall beneath the scar is gray white indurated tissue, which abuts the inked perirectal soft tissue margin. Total presumed lymph nodes: Found are forty possible lymph nodes ranging from 0.3 to 1.2 cm. Extramural satellite tumor nodules: None Mucosal polyp(s): None Additional findings: There are several unperforated diverticuli within the sigmoid. The appendix is 6.2 cm in length, averages 0.6 cm in diameter, has a smooth pink serosa and unremarkable cut surfaces. Block summary: A = proximal margin B, C = sections adjacent to stapled distal margin D-H = area of scar, full thickness I, J = scar, inner half sections K = diverticulum L = appendix. M = four nodes, whole N = four nodes, whole O = four nodes, whole P = five node, whole Q = four nodes, whole R = four nodes, whole S = four nodes, whole T = five nodes, whole U = five nodes, whole V = distal lymph node, bisected Twenty-two blocks. 2. Received in formalin is a 1 cm in length and up to 1 cm in diameter tubular portion of tissue, unoriented, with one end having possible light brown skin. On opening, there is tan pink smooth mucosa. The specimen is sectioned lengthwise and entirely submitted in two blocks. (SSW:kh 12-04-16) Report signed out from the following location(s) Technical component and interpretation was performed at [facility] [place], [place], [place] [address]. 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 Oral Surgery  Tonsillectomy   Diagnostic Studies History Adin Hector, MD; 08/29/2017 2:24 PM) Colonoscopy  within last year  Allergies Adin Hector, MD; 08/29/2017 2:24 PM) No Known Drug Allergies 11/02/2016  Medication History (Alisha Spillers,  CMA; 08/29/2017 2:06 PM) Lomotil (2.5-0.025MG Tablet, Oral) Active. Gabapentin (100MG Capsule, Oral) Active. Medications Reconciled  Social History Adin Hector, MD; 08/29/2017 2:24 PM) Caffeine use  Carbonated beverages. No alcohol use  No drug use  Tobacco use  Current every day smoker.  Family History Adin Hector, MD; 08/29/2017 2:24 PM) Colon Cancer  Family Members In General. Diabetes Mellitus  Mother. Hypertension  Mother. Kidney Disease  Mother.  Other Problems Adin Hector, MD; 08/29/2017 2:24 PM) Anxiety Disorder  Colon Cancer  Gastroesophageal Reflux Disease  Hemorrhoids   Vitals (Alisha Spillers CMA; 08/29/2017 2:05 PM) 08/29/2017 2:04 PM Weight: 158.4 lb Height: 72in Body Surface Area: 1.93 m Body Mass Index: 21.48 kg/m  Pulse: 68 (Regular)  BP: 110/74 (Sitting, Left Arm, Standard)       Physical Exam Adin Hector MD; 08/29/2017 3:00 PM) General Mental Status-Alert. General Appearance-Not in acute distress. Voice-Normal. Note: Tired but not toxic.   Integumentary Global Assessment Normal Exam - Distribution of scalp and body hair is normal. General Characteristics Overall Skin Surface - no rashes and no suspicious lesions.  Head and Neck Head-normocephalic, atraumatic with no lesions or palpable masses. Face Global Assessment - atraumatic, no absence of expression. Neck Global Assessment - no abnormal movements, no decreased range of motion. Trachea-midline. Thyroid Gland Characteristics - non-tender.  Eye Eyeball - Left-Extraocular movements intact, No Nystagmus. Eyeball  - Right-Extraocular movements intact, No Nystagmus. Upper Eyelid - Left-No Cyanotic. Upper Eyelid - Right-No Cyanotic.  Chest and Lung Exam Inspection Accessory muscles - No use of accessory muscles in breathing.  Abdomen Note: Right lower quadrant ileostomy pink with thick tan succus in bag. No leak or rash. Incisions with normal healing ridges. No cellulitis. No guarding/rebound tenderness   Male Genitourinary Note: No inguinal hernias. Normal external genitalia. Epididymi, testes, and spermatic cords normal without any masses.   Rectal Note: Normal sphincter tone. Staple anastomosis 1. 5-2 centimeters from anal verge. Strictured but allow [place] index finger to go through.  Perianal skin clean with good hygiene. No pruritis ani. No pilonidal disease. No fissure. No abscess/fistula. Normal sphincter tone.  No external hemorrhoids. No condyloma warts. Tolerates digital rectal exam. No rectal masses.    Peripheral Vascular Upper Extremity Inspection - Left - Not Gangrenous, No Petechiae. Right - Not Gangrenous, No Petechiae.  Neurologic Neurologic evaluation reveals -normal attention span and ability to concentrate, able to name objects and repeat phrases. Appropriate fund of knowledge and normal coordination.  Neuropsychiatric Mental status exam performed with findings of-able to articulate well with normal speech/language, rate, volume and coherence and no evidence of hallucinations, delusions, obsessions or homicidal/suicidal ideation. Orientation-oriented X3.  Musculoskeletal Global Assessment Gait and Station - normal gait and station. Note: Right upper inner arm PICC line in place. I removed. He tolerated well.   Lymphatic General Lymphatics Description - No Generalized lymphadenopathy.    Assessment & Plan Adin Hector MD; 08/29/2017 3:00 PM) RECTAL ADENOCARCINOMA (C20) Impression: Rectal cancer 4cm from anal verge s/p chemoXRT.  Status post robotic resection.  Pathology ypT3 ypN1 (2/35 lymph nodes). Discussed the GI tumor Board. He would benefit from post-adjuvant chemotherapy. A 3 months course would be appropriate is what I recall Dr. Burr Medico saying  Once he has completed that, plan evaluation of the ileoanal J-pouch. If that is clean, then Loop ileostomy takedown. Most likely 4-6 weeks after completing chemotherapy it to give his body chance to recover. ILEOSTOMY IN PLACE (Z93.2) Impression: Plan would be Loop ileostomy takedown  since >6 weeks after completing chemotherapy.   Contrast enema and pouchoscopy confirmed patch intact.  No significant scarring leak or stricture.  Okay to proceed with takedown.   The anatomy & physiology of the digestive tract was discussed.  The pathophysiology was discussed.  Possibility of remaining with an ostomy permanently was discussed.  I offered ostomy takedown.  Laparoscopic & open techniques were discussed.   Risks such as bleeding, infection, abscess, leak, reoperation, possible re-ostomy, injury to other organs, need for repair of tissues / organs, need for further treatment, hernia, heart attack, death, and other risks were discussed.   I noted a good likelihood this will help address the problem.  Goals of post-operative recovery were discussed as well.  We will work to minimize complications.  Questions were answered.  The patient expresses understanding & wishes to proceed with surgery.    HNPCC5 (Z15.09) PREOP COLON - ENCOUNTER FOR PREOPERATIVE EXAMINATION FOR GENERAL SURGICAL PROCEDURE (Z01.818) Current Plans You are being scheduled for surgery- Our schedulers will call you.  You should hear from our office's scheduling department within 5 working days about the location, date, and time of surgery. We try to make accommodations for patient's preferences in scheduling surgery, but sometimes the OR schedule or the surgeon's schedule prevents [place] from making those  accommodations.  If you have not heard from our office [phone]) in 5 working days, call the office and ask for your surgeon's nurse.  If you have other questions about your diagnosis, plan, or surgery, call the office and ask for your surgeon's nurse.  Written instructions provided Pt Education - CCS Colectomy post-op instructions: discussed with patient and provided information.

## 2017-09-22 NOTE — Anesthesia Postprocedure Evaluation (Signed)
Anesthesia Post Note  Patient: [name]  Procedure(s) Performed: TAKEDOWN OF LOOP ILEOSTOMY (N/A Abdomen)     Patient location during evaluation: PACU Anesthesia Type: General Level of consciousness: sedated Pain management: pain level controlled Vital Signs Assessment: post-procedure vital signs reviewed and stable Respiratory status: spontaneous breathing and respiratory function stable Cardiovascular status: stable Postop Assessment: no apparent nausea or vomiting Anesthetic complications: no    Last Vitals:  Vitals:   09/22/17 1133 09/22/17 1515  BP: 122/75   Pulse: 63   Resp: 18   Temp: 36.5 C 36.7 C  SpO2: 98%     Last Pain:  Vitals:   09/22/17 1133  TempSrc: Oral                 Jeanifer Halliday DANIEL

## 2017-09-22 NOTE — Anesthesia Preprocedure Evaluation (Signed)
Anesthesia Evaluation  Patient identified by MRN, date of birth, ID band Patient awake    Reviewed: Allergy & Precautions, NPO status , Patient's Chart, lab work & pertinent test results  History of Anesthesia Complications Negative for: history of anesthetic complications  Airway Mallampati: II  TM Distance: >3 FB Neck ROM: Full    Dental  (+) Dental Advisory Given, Edentulous Upper, Poor Dentition,    Pulmonary former smoker,    breath sounds clear to auscultation       Cardiovascular negative cardio ROS   Rhythm:Regular Rate:Normal     Neuro/Psych Anxiety negative neurological ROS     GI/Hepatic Neg liver ROS, Rectal cancer.   Endo/Other  negative endocrine ROS  Renal/GU negative Renal ROS     Musculoskeletal   Abdominal   Peds  Hematology negative hematology ROS (+)   Anesthesia Other Findings   Reproductive/Obstetrics                             Anesthesia Physical  Anesthesia Plan  ASA: II  Anesthesia Plan: General   Post-op Pain Management:    Induction: Intravenous  PONV Risk Score and Plan: 3 and Ondansetron, Dexamethasone and Scopolamine patch - Pre-op  Airway Management Planned: Oral ETT  Additional Equipment:   Intra-op Plan:   Post-operative Plan: Extubation in OR  Informed Consent: I have reviewed the patients History and Physical, chart, labs and discussed the procedure including the risks, benefits and alternatives for the proposed anesthesia with the patient or authorized representative who has indicated his/her understanding and acceptance.   Dental advisory given  Plan Discussed with: CRNA, Anesthesiologist and Surgeon  Anesthesia Plan Comments: (ERAS protocol)        Anesthesia Quick Evaluation

## 2017-09-22 NOTE — Discharge Instructions (Signed)
SURGERY: POST OP INSTRUCTIONS °(Surgery for small bowel obstruction, colon resection, etc) ° ° °###################################################################### ° °EAT °Gradually transition to a high fiber diet with a fiber supplement over the next few days after discharge ° °WALK °Walk an hour a day.  Control your pain to do that.   ° °CONTROL PAIN °Control pain so that you can walk, sleep, tolerate sneezing/coughing, go up/down stairs. ° °HAVE A BOWEL MOVEMENT DAILY °Keep your bowels regular to avoid problems.  OK to try a laxative to override constipation.  OK to use an antidairrheal to slow down diarrhea.  Call if not better after 2 tries ° °CALL IF YOU HAVE PROBLEMS/CONCERNS °Call if you are still struggling despite following these instructions. °Call if you have concerns not answered by these instructions ° °###################################################################### ° ° °DIET °Follow a light diet the first few days at home.  Start with a bland diet such as soups, liquids, starchy foods, low fat foods, etc.  If you feel full, bloated, or constipated, stay on a ful liquid or pureed/blenderized diet for a few days until you feel better and no longer constipated. °Be sure to drink plenty of fluids every day to avoid getting dehydrated (feeling dizzy, not urinating, etc.). °Gradually add a fiber supplement to your diet over the next week.  Gradually get back to a regular solid diet.  Avoid fast food or heavy meals the first week as you are more likely to get nauseated. °It is expected for your digestive tract to need a few months to get back to normal.  It is common for your bowel movements and stools to be irregular.  You will have occasional bloating and cramping that should eventually fade away.  Until you are eating solid food normally, off all pain medications, and back to regular activities; your bowels will not be normal. °Focus on eating a low-fat, high fiber diet the rest of your life  (See Getting to Good Bowel Health, below). ° °CARE of your INCISION or WOUND °It is good for closed incision and even open wounds to be washed every day.  Shower every day.  Short baths are fine.  Wash the incisions and wounds clean with soap & water.    °If you have a closed incision(s), wash the incision with soap & water every day.  You may leave closed incisions open to air if it is dry.   You may cover the incision with clean gauze & replace it after your daily shower for comfort. °If you have skin tapes (Steristrips) or skin glue (Dermabond) on your incision, leave them in place.  They will fall off on their own like a scab.  You may trim any edges that curl up with clean scissors.  If you have staples, set up an appointment for them to be removed in the office in 10 days after surgery.  °If you have a drain, wash around the skin exit site with soap & water and place a new dressing of gauze or band aid around the skin every day.  Keep the drain site clean & dry.    °If you have an open wound with packing, see wound care instructions.  In general, it is encouraged that you remove your dressing and packing, shower with soap & water, and replace your dressing once a day.  Pack the wound with clean gauze moistened with normal (0.9%) saline to keep the wound moist & uninfected.  Pressure on the dressing for 30 minutes will stop most wound   bleeding.  Eventually your body will heal & pull the open wound closed over the next few months.  °Raw open wounds will occasionally bleed or secrete yellow drainage until it heals closed.  Drain sites will drain a little until the drain is removed.  Even closed incisions can have mild bleeding or drainage the first few days until the skin edges scab over & seal.   °If you have an open wound with a wound vac, see wound vac care instructions. ° ° ° ° °ACTIVITIES as tolerated °Start light daily activities --- self-care, walking, climbing stairs-- beginning the day after surgery.   Gradually increase activities as tolerated.  Control your pain to be active.  Stop when you are tired.  Ideally, walk several times a day, eventually an hour a day.   °Most people are back to most day-to-day activities in a few weeks.  It takes 4-8 weeks to get back to unrestricted, intense activity. °If you can walk 30 minutes without difficulty, it is safe to try more intense activity such as jogging, treadmill, bicycling, low-impact aerobics, swimming, etc. °Save the most intensive and strenuous activity for last (Usually 4-8 weeks after surgery) such as sit-ups, heavy lifting, contact sports, etc.  Refrain from any intense heavy lifting or straining until you are off narcotics for pain control.  You will have off days, but things should improve week-by-week. °DO NOT PUSH THROUGH PAIN.  Let pain be your guide: If it hurts to do something, don't do it.  Pain is your body warning you to avoid that activity for another week until the pain goes down. °You may drive when you are no longer taking narcotic prescription pain medication, you can comfortably wear a seatbelt, and you can safely make sudden turns/stops to protect yourself without hesitating due to pain. °You may have sexual intercourse when it is comfortable. If it hurts to do something, stop. ° °MEDICATIONS °Take your usually prescribed home medications unless otherwise directed.   °Blood thinners:  °Usually you can restart any strong blood thinners after the second postoperative day.  It is OK to take aspirin right away.    ° If you are on strong blood thinners (warfarin/Coumadin, Plavix, Xerelto, Eliquis, Pradaxa, etc), discuss with your surgeon, medicine PCP, and/or cardiologist for instructions on when to restart the blood thinner & if blood monitoring is needed (PT/INR blood check, etc).   ° ° °PAIN CONTROL °Pain after surgery or related to activity is often due to strain/injury to muscle, tendon, nerves and/or incisions.  This pain is usually  short-term and will improve in a few months.  °To help speed the process of healing and to get back to regular activity more quickly, DO THE FOLLOWING THINGS TOGETHER: °1. Increase activity gradually.  DO NOT PUSH THROUGH PAIN °2. Use Ice and/or Heat °3. Try Gentle Massage and/or Stretching °4. Take over the counter pain medication °5. Take Narcotic prescription pain medication for more severe pain ° °Good pain control = faster recovery.  It is better to take more medicine to be more active than to stay in bed all day to avoid medications. °1.  Increase activity gradually °Avoid heavy lifting at first, then increase to lifting as tolerated over the next 6 weeks. °Do not “push through” the pain.  Listen to your body and avoid positions and maneuvers than reproduce the pain.  Wait a few days before trying something more intense °Walking an hour a day is encouraged to help your body recover faster   and more safely.  Start slowly and stop when getting sore.  If you can walk 30 minutes without stopping or pain, you can try more intense activity (running, jogging, aerobics, cycling, swimming, treadmill, sex, sports, weightlifting, etc.) °Remember: If it hurts to do it, then don’t do it! °2. Use Ice and/or Heat °You will have swelling and bruising around the incisions.  This will take several weeks to resolve. °Ice packs or heating pads (6-8 times a day, 30-60 minutes at a time) will help sooth soreness & bruising. °Some people prefer to use ice alone, heat alone, or alternate between ice & heat.  Experiment and see what works best for you.  Consider trying ice for the first few days to help decrease swelling and bruising; then, switch to heat to help relax sore spots and speed recovery. °Shower every day.  Short baths are fine.  It feels good!  Keep the incisions and wounds clean with soap & water.   °3. Try Gentle Massage and/or Stretching °Massage at the area of pain many times a day °Stop if you feel pain - do not  overdo it °4. Take over the counter pain medication °This helps the muscle and nerve tissues become less irritable and calm down faster °Choose ONE of the following over-the-counter anti-inflammatory medications: °Acetaminophen 500mg tabs (Tylenol) 1-2 pills with every meal and just before bedtime (avoid if you have liver problems or if you have acetaminophen in you narcotic prescription) °Naproxen 220mg tabs (ex. Aleve, Naprosyn) 1-2 pills twice a day (avoid if you have kidney, stomach, IBD, or bleeding problems) °Ibuprofen 200mg tabs (ex. Advil, Motrin) 3-4 pills with every meal and just before bedtime (avoid if you have kidney, stomach, IBD, or bleeding problems) °Take with food/snack several times a day as directed for at least 2 weeks to help keep pain / soreness down & more manageable. °5. Take Narcotic prescription pain medication for more severe pain °A prescription for strong pain control is often given to you upon discharge (for example: oxycodone/Percocet, hydrocodone/Norco/Vicodin, or tramadol/Ultram) °Take your pain medication as prescribed. °Be mindful that most narcotic prescriptions contain Tylenol (acetaminophen) as well - avoid taking too much Tylenol. °If you are having problems/concerns with the prescription medicine (does not control pain, nausea, vomiting, rash, itching, etc.), please call us [phone] to see if we need to switch you to a different pain medicine that will work better for you and/or control your side effects better. °If you need a refill on your pain medication, you must call the office before 4 pm and on weekdays only.  By federal law, prescriptions for narcotics cannot be called into a pharmacy.  They must be filled out on paper & picked up from our office by the patient or authorized caretaker.  Prescriptions cannot be filled after 4 pm nor on weekends.   ° °WHEN TO CALL US [phone] °Severe uncontrolled or worsening pain  °Fever over 101 F (38.5 C) °Concerns with  the incision: Worsening pain, redness, rash/hives, swelling, bleeding, or drainage °Reactions / problems with new medications (itching, rash, hives, nausea, etc.) °Nausea and/or vomiting °Difficulty urinating °Difficulty breathing °Worsening fatigue, dizziness, lightheadedness, blurred vision °Other concerns °If you are not getting better after two weeks or are noticing you are getting worse, contact our office [phone] for further advice.  We may need to adjust your medications, re-evaluate you in the office, send you to the emergency room, or see what other things we can do to help. °The   clinic staff is available to answer your questions during regular business hours (8:30am-5pm).  Please don’t hesitate to call and ask to speak to one of our nurses for clinical concerns.    °A surgeon from [facility] is always on call at the hospitals 24 hours/day °If you have a medical emergency, go to the nearest emergency room or call 911. ° °FOLLOW UP in our office °One the day of your discharge from the hospital (or the next business weekday), please call [facility] to set up or confirm an appointment to see your surgeon in the office for a follow-up appointment.  Usually it is 2-3 weeks after your surgery.   °If you have skin staples at your incision(s), let the office know so we can set up a time in the office for the nurse to remove them (usually around 10 days after surgery). °Make sure that you call for appointments the day of discharge (or the next business weekday) from the hospital to ensure a convenient appointment time. °IF YOU HAVE DISABILITY OR FAMILY LEAVE FORMS, BRING THEM TO THE OFFICE FOR PROCESSING.  DO NOT GIVE THEM TO YOUR DOCTOR. ° °[facility] °[address], [address], [place],   [address] ? °[phone] - Main °[phone] - Toll Free,  [phone] - Fax °[email or web address] ° °GETTING TO GOOD BOWEL HEALTH. °It is  expected for your digestive tract to need a few months to get back to normal.  It is common for your bowel movements and stools to be irregular.  You will have occasional bloating and cramping that should eventually fade away.  Until you are eating solid food normally, off all pain medications, and back to regular activities; your bowels will not be normal.   °Avoiding constipation °The goal: ONE SOFT BOWEL MOVEMENT A DAY!    °Drink plenty of fluids.  Choose water first. °TAKE A FIBER SUPPLEMENT EVERY DAY THE REST OF YOUR LIFE °During your first week back home, gradually add back a fiber supplement every day °Experiment which form you can tolerate.   There are many forms such as powders, tablets, wafers, gummies, etc °Psyllium bran (Metamucil), methylcellulose (Citrucel), Miralax or Glycolax, Benefiber, Flax Seed.  °Adjust the dose week-by-week (1/2 dose/day to 6 doses a day) until you are moving your bowels 1-2 times a day.  Cut back the dose or try a different fiber product if it is giving you problems such as diarrhea or bloating. °Sometimes a laxative is needed to help jump-start bowels if constipated until the fiber supplement can help regulate your bowels.  If you are tolerating eating & you are farting, it is okay to try a gentle laxative such as double dose MiraLax, prune juice, or Milk of Magnesia.  Avoid using laxatives too often. °Stool softeners can sometimes help counteract the constipating effects of narcotic pain medicines.  It can also cause diarrhea, so avoid using for too long. °If you are still constipated despite taking fiber daily, eating solids, and a few doses of laxatives, call our office. °Controlling diarrhea °Try drinking liquids and eating bland foods for a few days to avoid stressing your intestines further. °Avoid dairy products (especially milk & ice cream) for a short time.  The intestines often can lose the ability to digest lactose when stressed. °Avoid foods that cause gassiness or  bloating.  Typical foods include beans and other legumes, cabbage, broccoli, and dairy foods.  Avoid greasy, spicy, fast foods.  Every person has   some sensitivity to other foods, so listen to your body and avoid those foods that trigger problems for you. Probiotics (such as active yogurt, Align, etc) may help repopulate the intestines and colon with normal bacteria and calm down a sensitive digestive tract Adding a fiber supplement gradually can help thicken stools by absorbing excess fluid and retrain the intestines to act more normally.  Slowly increase the dose over a few weeks.  Too much fiber too soon can backfire and cause cramping & bloating. It is okay to try and slow down diarrhea with a few doses of antidiarrheal medicines.   Bismuth subsalicylate (ex. Kayopectate, Pepto Bismol) for a few doses can help control diarrhea.  Avoid if pregnant.   Loperamide (Imodium) can slow down diarrhea.  Start with one tablet (2mg ) first.  Avoid if you are having fevers or severe pain.  ILEOSTOMY PATIENTS WILL HAVE CHRONIC DIARRHEA since their colon is not in use.    Drink plenty of liquids.  You will need to drink even more glasses of water/liquid a day to avoid getting dehydrated. Record output from your ileostomy.  Expect to empty the bag every 3-4 hours at first.  Most people with a permanent ileostomy empty their bag 4-6 times at the least.   Use antidiarrheal medicine (especially Imodium) several times a day to avoid getting dehydrated.  Start with a dose at bedtime & breakfast.  Adjust up or down as needed.  Increase antidiarrheal medications as directed to avoid emptying the bag more than 8 times a day (every 3 hours). Work with your wound ostomy nurse to learn care for your ostomy.  See ostomy care instructions. TROUBLESHOOTING IRREGULAR BOWELS 1) Start with a soft & bland diet. No spicy, greasy, or fried foods.  2) Avoid gluten/wheat or dairy products from diet to see if symptoms improve. 3) Miralax  17gm or flax seed mixed in [place]. water or juice-daily. May use 2-4 times a day as needed. 4) Gas-X, Phazyme, etc. as needed for gas & bloating.  5) Prilosec (omeprazole) over-the-counter as needed 6)  Consider probiotics (Align, Activa, etc) to help calm the bowels down  Call your doctor if you are getting worse or not getting better.  Sometimes further testing (cultures, endoscopy, X-ray studies, CT scans, bloodwork, etc.) may be needed to help diagnose and treat the cause of the diarrhea. [facility], [place], [place], [place], [place]  [address] [phone] - Main.    [phone]  - Toll Free.   [phone] - Fax [email or web address]  swollen glands   Lynch Syndrome Lynch syndrome, also called hereditary nonpolyposis colorectal cancer (HNPCC), is a condition that increases a person's risk for developing colorectal cancer before age 84. Lynch syndrome can also increase a person's risk for many other types of cancer, including stomach, small intestine, liver, gallbladder, pancreas, urinary tract, skin, and brain cancers. Women with this condition have a higher risk for developing cancer of the ovaries and cancer in the lining of the uterus (endometrium). What are the causes? This condition is caused by a gene mutation that is inherited from one or both parents. A gene mutation is a harmful change in a gene. Not everyone who inherits the genetic mutation develops cancer. What are the signs or symptoms? There are no symptoms of this condition. However, your health care provider may test you for Lynch syndrome if you:  Have colorectal cancer before age 63.  Have family members diagnosed with colorectal, endometrial, or other  types of cancer.  How is this diagnosed? This condition is diagnosed with:  A review of your family history of cancer.  A blood test to look for the mutations that cause this condition.  Testing of tumor tissue  (biopsy).  How is this treated? This condition may be managed with:  Genetic counseling to assess your risk and your options for management.  Regular screening tests for the associated cancers. You may need to have a colonoscopy every 1-2 years starting at an early age.  Daily aspirin therapy.  Preventive surgery to remove sites where cancer can develop, such as the colon, uterus, and ovaries.  Follow these instructions at home:  Ask your health care provider about your risks.  Discuss a referral for genetic counseling. Ask about the risks and benefits of genetic counseling.  Write down any questions you have about your condition.  Follow your plan for cancer screenings as told by your health care provider.  Take over-the-counter and prescription medicines only as told by your health care provider.  Maintain a healthy diet.  Consider joining a support group. This may help you learn to cope with the stress of having Lynch syndrome.  Keep all follow-up visits as told by your health care provider. This is important. Contact a health care provider if:  You develop any new or unusual symptoms.  You develop symptoms of colorectal cancer, such as: ? Blood in the stool. ? Changes in bowel habits. ? Abdominal pain or bloating. ? Unexplained weight loss. Summary  Lynch syndrome is caused by an inherited gene mutation. Not everyone who inherits this mutation will develop cancer.  Genetic counseling and blood testing for Lynch syndrome can identify people with the condition.  Regular cancer screening tests are important in managing this condition. This information is not intended to replace advice given to you by your health care provider. Make sure you discuss any questions you have with your health care provider. Document Released: 07/30/2016 Document Revised: 08/05/2016 Document Reviewed: 07/30/2016 Elsevier Interactive Patient Education  [employer].

## 2017-09-22 NOTE — Interval H&P Note (Signed)
History and Physical Interval Note:  09/22/2017 12:43 PM  [name]  has presented today for surgery, with the diagnosis of FECAL DIVERSION, RECTAL CANCER  The various methods of treatment have been discussed with the patient and family. After consideration of risks, benefits and other options for treatment, the patient has consented to  Procedure(s): TAKEDOWN OF LOOP ILEOSTOMY (N/A) EXAM UNDER ANESTHESIA (N/A) as a surgical intervention .  The patient's history has been reviewed, patient examined, no change in status, stable for surgery.  I have reviewed the patient's chart and labs.  Questions were answered to the patient's satisfaction.    I have re-reviewed the the patient's records, history, medications, and allergies.  I have re-examined the patient.  I again discussed intraoperative plans and goals of post-operative recovery.  The patient agrees to proceed.  [name]  1970/12/21 [id number]  Patient Care Team: Patient, No Pcp Per as PCP - General (General Practice) Michael Boston, MD as Consulting Physician (General Surgery) [name], Kirke Corin, MD as Consulting Physician (Gastroenterology) Kyung Rudd, MD as Consulting Physician (Radiation Oncology) Truitt Merle, MD as Consulting Physician (Oncology)  Patient Active Problem List   Diagnosis Date Noted  . Rectal adenocarcinoma s/p protctocolectomy, IPAA "J" pouch 12/01/2016 08/06/2016    Priority: High  . Muscle spasm 04/11/2017  . Encounter for antineoplastic chemotherapy 04/11/2017  . Diarrhea 02/17/2017  . 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 Hypomagnesemia 12/02/2016  . 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 10/13/2016  . Family history of colon cancer   . Anxiety 09/14/2016    Past Medical History:  Diagnosis Date  . Anxiety   . Cancer ([place]) 12/01/2016   colon   . Family history of colon cancer   . Poor dental hygiene     Past  Surgical History:  Procedure Laterality Date  . EUS N/A 07/29/2016   Procedure: LOWER ENDOSCOPIC ULTRASOUND (EUS);  Surgeon: Milus Banister, MD;  Location: [facility];  Service: Endoscopy;  Laterality: N/A;  . PORTACATH PLACEMENT N/A 01/26/2017   Procedure: PLACEMENT OF PORT-A-CATH CENTRAL LINE WITH FLUOROSCOPY AND ULTRASOUND;  Surgeon: Michael Boston, MD;  Location: [facility];  Service: General;  Laterality: N/A;  . POUCHOSCOPY N/A 09/21/2017   Procedure: POUCHOSCOPY;  Surgeon: Leighton Ruff, MD;  Location: [facility];  Service: Endoscopy;  Laterality: N/A;  . PROCTOSCOPY N/A 12/01/2016   Procedure: RIGID PROCTOSCOPY;  Surgeon: Michael Boston, MD;  Location: [facility];  Service: General;  Laterality: N/A;  . TOE AMPUTATION Left   . XI ROBOTIC ASSISTED LOWER ANTERIOR RESECTION N/A 12/01/2016   Procedure: XI ROBOTIC ASSISTED PROCTOCOLECTOMY WITH ILLEOPOUCH ANASTAMOSIS WITH DIVERTING ILLEOSTOMY;  Surgeon: Michael Boston, MD;  Location: [facility];  Service: General;  Laterality: N/A;    Social History   Social History  . Marital status: Married    Spouse name: N/A  . Number of children: 3  . Years of education: N/A   Occupational History  . Not on file.   Social History Main Topics  . Smoking status: Former Smoker    Packs/day: 1.50    Years: 30.00    Types: Cigarettes    Quit date: 12/01/2016  . Smokeless tobacco: Never Used  . Alcohol use No  . Drug use: No     Comment: patient denies  . Sexual activity: Not on file   Other Topics Concern  . Not on file   Social  History Narrative   Lives with significant other, [name]   Have a 68 year old son   Smoker      [place]    Family History  Problem Relation Age of Onset  . Diabetes Mother   . COPD Father   . Colon cancer Maternal Aunt        dx in her 32s  . Diabetes Maternal Grandmother   . Brain cancer Maternal Grandfather   . Lung cancer Paternal Grandfather   . Colon cancer  Cousin        dx in her 76s  . Bone cancer Sister 8  . Pancreatic cancer Neg Hx   . Esophageal cancer Neg Hx   . Stomach cancer Neg Hx   . Liver disease Neg Hx     Current Facility-Administered Medications  Medication Dose Route Frequency Provider Last Rate Last Dose  . bupivacaine liposome (EXPAREL) 1.3 % injection 266 mg  20 mL Infiltration On Call to OR Michael Boston, MD      . cefoTEtan in Dextrose 5% (CEFOTAN) IVPB 2 g  2 g Intravenous On Call to OR Michael Boston, MD      . lactated ringers infusion   Intravenous Continuous Duane Boston, MD 50 mL/hr at 09/22/17 1158       No Known Allergies  BP 122/75   Pulse 63   Temp 97.7 F (36.5 C) (Oral)   Resp 18   Ht '5\' 11"'$  (1.803 m)   Wt 71.7 kg (158 lb)   SpO2 98%   BMI 22.04 kg/m   Labs: No results found for this or any previous visit (from the past 48 hour(s)).  Imaging / Studies: Dg Colon W/cm - Wo/w Kub  Result Date: 09/14/2017 CLINICAL DATA:  Status post total proctocolectomy 12/01/2016 with J pouch anastomosis and diverting ileostomy. Patient presents for preoperative evaluation of the J-pouch anastomosis prior to diverting ileostomy takedown. EXAM: BE WITH CONTRAST - WITHOUT AND WITH KUB CONTRAST:  Omnipaque 300 iodinated contrast per rectal foley catheter. FLUOROSCOPY TIME:  Fluoroscopy Time:  2 minutes 18 seconds Radiation Exposure Index (if provided by the fluoroscopic device): 859 mGy Number of Acquired Spot Images: 12 COMPARISON:  11/29/2016 CT abdomen/pelvis. FINDINGS: Scout radiograph demonstrates ileostomy bag overlying the right mid to lower abdomen. No dilated small bowel loops. No evidence of pneumatosis or pneumoperitoneum. No pathologic soft tissue calcifications. Clear lung bases. Instilled rectal contrast distends the J-pouch and distal small bowel and courses retrograde to the diverting ileostomy bag without delay. Ileoanal anastomosis is intact with no evidence of anastomotic leak. No persistent filling  defects or significant fold thickening. J-pouch appears normal in distensibility and contour. IMPRESSION: Satisfactory appearance of the ileoanal J-pouch anastomosis, with no evidence of anastomotic dehiscence. Electronically Signed   By: Ilona Sorrel M.D.   On: 09/14/2017 09:09     .Adin Hector, M.D., F.A.C.S. Gastrointestinal and Minimally Invasive Surgery [facility] [address] [place], [place] [address] [phone] Main / Paging  09/22/2017 12:43 PM    Mina Babula C.

## 2017-09-23 LAB — BASIC METABOLIC PANEL
ANION GAP: 8 (ref 5–15)
BUN: 10 mg/dL (ref 6–20)
CALCIUM: 8.5 mg/dL — AB (ref 8.9–10.3)
CO2: 25 mmol/L (ref 22–32)
Chloride: 106 mmol/L (ref 101–111)
Creatinine, Ser: 0.91 mg/dL (ref 0.61–1.24)
GFR calc Af Amer: >60 mL/min (ref >60)
GFR calc non Af Amer: >60 mL/min (ref >60)
GLUCOSE: 154 mg/dL — AB (ref 65–99)
POTASSIUM: 4.1 mmol/L (ref 3.5–5.1)
Sodium: 139 mmol/L (ref 135–145)

## 2017-09-23 LAB — MAGNESIUM: MAGNESIUM: 1.9 mg/dL (ref 1.7–2.4)

## 2017-09-23 LAB — CBC
HCT: 41.6 % (ref 39.0–52.0)
HEMOGLOBIN: 14.2 g/dL (ref 13.0–17.0)
MCH: 29.2 pg (ref 26.0–34.0)
MCHC: 34.1 g/dL (ref 30.0–36.0)
MCV: 85.6 fL (ref 78.0–100.0)
PLATELETS: 167 10*3/uL (ref 150–400)
RBC: 4.86 MIL/uL (ref 4.22–5.81)
RDW: 14.7 % (ref 11.5–15.5)
WBC: 11.3 10*3/uL — AB (ref 4.0–10.5)

## 2017-09-23 MED ORDER — LOPERAMIDE HCL 2 MG PO CAPS: 2.0000 mg | ORAL_CAPSULE | Freq: Three times a day (TID) | ORAL | Status: DC | PRN

## 2017-09-23 MED ORDER — BISMUTH SUBSALICYLATE 262 MG/15ML PO SUSP
30.0000 mL | Freq: Three times a day (TID) | ORAL | Status: DC | PRN
  Filled 2017-09-23: qty 236

## 2017-09-23 NOTE — Progress Notes (Signed)
[facility]  [address]  [place], [place] [address] Phone: [phone]  FAX: [phone]      [name] [id number] 03/10/1971  CARE TEAM:  PCP: Patient, No Pcp Per  Outpatient Care Team: Patient Care Team: Patient, No Pcp Per as PCP - General (General Practice) Gross, [name], MD as Consulting Physician (General Surgery) [name], [name], MD as Consulting Physician (Gastroenterology) [name], [name], MD as Consulting Physician (Radiation Oncology) [name], [name], MD as Consulting Physician (Oncology)  Inpatient Treatment Team: Treatment Team: Attending Provider: Gross, [name], MD; Technician: Vanada, Alexandra M, NT   Problem List:   Principal Problem:   Status post loop ileostomy takedown 09/22/2017 Active Problems:   Rectal adenocarcinoma s/p protctocolectomy, IPAA "J" pouch 12/01/2016   Anxiety   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 -Hemoglobin 14.2, no signs of active bleeding -VTE prophylaxis- SCDs, etc -Mobilize as tolerated to help recovery  15 minutes spent in review, evaluation, examination, counseling, and coordination of care.  More than 50% of that time was spent in counseling.  [name], [name]  09/23/2017    Subjective: (Chief complaint)  Patient feels his pain is well controlled at this time. Able to ambulate in the hall without lightheadedness/dizziness. Patient states he sleeps "about 30 min to an hour at a time" as per his baseline preoperatively. Denies nausea/vomiting/abdominal pain. Denies BM/flatus.  Some eructation, "no more than usual". Patient states he tolerated clear liquids well and is  hungry.  Objective:  Vital signs:  Vitals:   09/22/17 2228 09/23/17 0149 09/23/17 0523 09/23/17 0641  BP: (!) 125/100 133/80 134/79   Pulse: 61 81 70   Resp: 18 20 18   Temp: 97.8 F (36.6 C) 97.9 F (36.6 C) 97.8 F (36.6 C)   TempSrc: Oral Oral Oral   SpO2: 99% 99% 97%   Weight:    72.3 kg (159 lb 6.3 oz)  Height:        Last BM Date: 09/22/17  Intake/Output   Yesterday:  10/04 0701 - 10/05 0700 In: 1601.7 [P.O.:600; I.V.:1001.7] Out: 875 [Urine:850; Blood:25] This shift:  No intake/output data recorded.  Bowel function:  Flatus: No  BM:  No  Drain: (No drain)   Physical Exam:  General: Pt awake/alert/oriented x4 in no acute distress Eyes: PERRL, normal EOM.  Sclera clear.  No icterus Neuro: CN II-XII intact w/o focal sensory/motor deficits. Lymph: No head/neck/groin lymphadenopathy Psych:  No delerium/psychosis/paranoia HENT: Normocephalic, Mucus membranes moist.  No thrush Neck: Supple, No tracheal deviation Chest:  No chest wall pain w good excursion CV:  Pulses intact.  Regular rhythm MS: Normal AROM mjr joints.  No obvious deformity  Abdomen: Soft.  Nondistended.  Mildly tender at incisions only.  No evidence of peritonitis.  No incarcerated hernias.  Ext:   No deformity.  No mjr edema.  No cyanosis Skin: No petechiae / purpura  Results:   Labs: Results for orders placed or performed during the hospital encounter of 09/22/17 (from the past 48 hour(s))  Basic metabolic panel     Status: Abnormal   Collection Time: 09/23/17  4:31 AM  Result Value Ref Range   Sodium 139 135 - 145 mmol/L   Potassium   4.1 3.5 - 5.1 mmol/L   Chloride 106 101 - 111 mmol/L   CO2 25 22 - 32 mmol/L   Glucose, Bld 154 (H) 65 - 99 mg/dL   BUN 10 6 - 20 mg/dL   Creatinine, Ser 0.91 0.61 - 1.24 mg/dL   Calcium 8.5 (L) 8.9 - 10.3 mg/dL   GFR calc non Af Amer >60 >60 mL/min   GFR calc Af Amer >60 >60 mL/min    Comment: (NOTE) The eGFR has been calculated using the CKD  EPI equation. This calculation has not been validated in all clinical situations. eGFR's persistently <60 mL/min signify possible Chronic Kidney Disease.    Anion gap 8 5 - 15  CBC     Status: Abnormal   Collection Time: 09/23/17  4:31 AM  Result Value Ref Range   WBC 11.3 (H) 4.0 - 10.5 K/uL   RBC 4.86 4.22 - 5.81 MIL/uL   Hemoglobin 14.2 13.0 - 17.0 g/dL   HCT 41.6 39.0 - 52.0 %   MCV 85.6 78.0 - 100.0 fL   MCH 29.2 26.0 - 34.0 pg   MCHC 34.1 30.0 - 36.0 g/dL   RDW 14.7 11.5 - 15.5 %   Platelets 167 150 - 400 K/uL  Magnesium     Status: None   Collection Time: 09/23/17  4:31 AM  Result Value Ref Range   Magnesium 1.9 1.7 - 2.4 mg/dL    Imaging / Studies: No results found.  Medications / Allergies: per chart  Antibiotics: Anti-infectives    Start     Dose/Rate Route Frequency Ordered Stop   09/22/17 2200  cefoTEtan (CEFOTAN) 2 g in dextrose 5 % 50 mL IVPB     2 g 100 mL/hr over 30 Minutes Intravenous Every 12 hours 09/22/17 1616 09/22/17 2258   09/22/17 1129  cefoTEtan in Dextrose 5% (CEFOTAN) IVPB 2 g     2 g Intravenous On call to O.R. 09/22/17 1129 09/22/17 1315        Note: Portions of this report may have been transcribed using voice recognition software. Every effort was made to ensure accuracy; however, inadvertent computerized transcription errors may be present.   Any transcriptional errors that result from this process are unintentional.     [place], [name]  09/23/2017

## 2017-09-24 MED ORDER — OXYCODONE HCL 5 MG PO TABS
5.0000 mg | ORAL_TABLET | Freq: Four times a day (QID) | ORAL | Status: DC | PRN
  Administered 2017-09-24: 5 mg via ORAL
  Administered 2017-09-24 – 2017-09-25 (×2): 10 mg via ORAL
  Filled 2017-09-24: qty 1
  Filled 2017-09-24 (×2): qty 2

## 2017-09-24 NOTE — Progress Notes (Signed)
[facility]  [address] [place], [place] [address] Phone: [phone]  FAX: [phone]      [name] [id number] 1971-11-18  CARE TEAM:  PCP: Patient, No Pcp Per  Outpatient Care Team: Patient Care Team: Patient, No Pcp Per as PCP - General (General Practice) Michael Boston, MD as Consulting Physician (General Surgery) [place], Kirke Corin, MD as Consulting Physician (Gastroenterology) Kyung Rudd, MD as Consulting Physician (Radiation Oncology) Truitt Merle, MD as Consulting Physician (Oncology)  Inpatient Treatment Team: Treatment Team: Attending Provider: Michael Boston, MD; Technician: Etheleen Sia, NT; Technician: Abbe [place], NT   Problem List:   Principal Problem:   Status post loop ileostomy takedown 09/22/2017 Active Problems:   Rectal adenocarcinoma s/p protctocolectomy, IPAA "J" pouch 12/01/2016   Anxiety   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  Add oxycodone.  -VTE prophylaxis- SCDs, etc -Mobilize as tolerated to help recovery   09/24/2017    Subjective: (Chief complaint)  Requiring IV pain meds because tramadol not quite sufficient.  No n/v.  Having stools.    Objective:  Vital signs:  Vitals:   09/23/17 1834 09/23/17 2206 09/24/17 0530 09/24/17 0700  BP: 128/81 139/79 115/84   Pulse: (!) 58 (!) 58 (!) 57   Resp: _0 Temp: 98.1 F (36.7 C) 98 F (36.7 C) 97.9 F (36.6 C)   TempSrc: Oral Oral Oral   SpO2: 99% 96% 98%   Weight:    77.4 kg (170 lb 10.2 oz)  Height:        Last BM Date: 09/23/17  Intake/Output   Yesterday:  10/05 0701 - 10/06 0700 In: 3217.5 [P.O.:1750; I.V.:1467.5] Out: 5993  [Urine:1775] This shift:  Total I/O In: -  Out: 200 [Urine:200]  Bowel function:  Flatus: No  BM:  No  Drain: (No drain)   Physical Exam: Gen - A&O x 3.  Looks uncomfortable.   Resp breathing comfortably. Abd soft, non tender, non distended.  Dressing c/d/i.      Results:   Labs: Results for orders placed or performed during the hospital encounter of 09/22/17 (from the past 48 hour(s))  Basic metabolic panel     Status: Abnormal   Collection Time: 09/23/17  4:31 AM  Result Value Ref Range   Sodium 139 135 - 145 mmol/L   Potassium 4.1 3.5 - 5.1 mmol/L   Chloride 106 101 - 111 mmol/L   CO2 25 22 - 32 mmol/L   Glucose, Bld 154 (H) 65 - 99 mg/dL   BUN 10 6 - 20 mg/dL   Creatinine, Ser 0.91 0.61 - 1.24 mg/dL   Calcium 8.5 (L) 8.9 - 10.3 mg/dL   GFR calc non Af Amer >60 >60 mL/min   GFR calc Af Amer >60 >60 mL/min    Comment: (NOTE) The eGFR has been calculated using the CKD EPI equation. This calculation has not been validated in all clinical situations. eGFR's persistently <60 mL/min signify possible Chronic Kidney Disease.    Anion gap 8 5 - 15  CBC     Status: Abnormal   Collection Time: 09/23/17  4:31 AM  Result Value Ref Range   WBC 11.3 (H) 4.0 -  10.5 K/uL   RBC 4.86 4.22 - 5.81 MIL/uL   Hemoglobin 14.2 13.0 - 17.0 g/dL   HCT 41.6 39.0 - 52.0 %   MCV 85.6 78.0 - 100.0 fL   MCH 29.2 26.0 - 34.0 pg   MCHC 34.1 30.0 - 36.0 g/dL   RDW 14.7 11.5 - 15.5 %   Platelets 167 150 - 400 K/uL  Magnesium     Status: None   Collection Time: 09/23/17  4:31 AM  Result Value Ref Range   Magnesium 1.9 1.7 - 2.4 mg/dL    Imaging / Studies: No results found.  Medications / Allergies: per chart  Antibiotics: Anti-infectives    Start     Dose/Rate Route Frequency Ordered Stop   09/22/17 2200  cefoTEtan (CEFOTAN) 2 g in dextrose 5 % 50 mL IVPB     2 g 100 mL/hr over 30 Minutes Intravenous Every 12 hours 09/22/17 1616 09/22/17 2258   09/22/17 1129  cefoTEtan in  Dextrose 5% (CEFOTAN) IVPB 2 g     2 g Intravenous On call to O.R. 09/22/17 1129 09/22/17 1315        Note: Portions of this report may have been transcribed using voice recognition software. Every effort was made to ensure accuracy; however, inadvertent computerized transcription errors may be present.   Any transcriptional errors that result from this process are unintentional.   09/24/2017

## 2017-09-24 NOTE — Progress Notes (Signed)
Pharmacy Brief Note - Alvimopan (Entereg)  The standing order set for alvimopan (Entereg) now includes an automatic order to discontinue the drug after the patient has had a bowel movement. The change was approved by the [place] and the Medical Executive Committee.  This patient has had a bowel movement documented by nursing. Therefore, alvimopan has been discontinued. If there are questions, please contact the pharmacy at [phone].  Thank you   Reuel Boom, PharmD, BCPS Pager: [phone] 09/24/2017, 3:08 PM

## 2017-09-25 MED ORDER — DIPHENOXYLATE-ATROPINE 2.5-0.025 MG PO TABS
1.0000 | ORAL_TABLET | Freq: Three times a day (TID) | ORAL | Status: DC
  Administered 2017-09-25 – 2017-09-27 (×5): 1 via ORAL
  Filled 2017-09-25 (×6): qty 1

## 2017-09-25 MED ORDER — OXYCODONE HCL 5 MG PO TABS
5.0000 mg | ORAL_TABLET | ORAL | Status: DC | PRN
  Administered 2017-09-25 (×2): 10 mg via ORAL
  Administered 2017-09-25: 5 mg via ORAL
  Administered 2017-09-26: 10 mg via ORAL
  Filled 2017-09-25 (×2): qty 2
  Filled 2017-09-25: qty 1
  Filled 2017-09-25: qty 2

## 2017-09-25 MED ORDER — METHOCARBAMOL 500 MG PO TABS
500.0000 mg | ORAL_TABLET | Freq: Four times a day (QID) | ORAL | Status: DC
  Administered 2017-09-25 – 2017-09-27 (×8): 500 mg via ORAL
  Filled 2017-09-25 (×8): qty 1

## 2017-09-25 NOTE — Progress Notes (Addendum)
[facility]  [address] [place], [place] [address] Phone: [phone]  FAX: [phone]      [name] [id number] 08-07-1971  CARE TEAM:  PCP: Patient, No Pcp Per  Outpatient Care Team: Patient Care Team: Patient, No Pcp Per as PCP - General (General Practice) Michael Boston, MD as Consulting Physician (General Surgery) [place], Kirke Corin, MD as Consulting Physician (Gastroenterology) Kyung Rudd, MD as Consulting Physician (Radiation Oncology) Truitt Merle, MD as Consulting Physician (Oncology)  Inpatient Treatment Team: Treatment Team: Attending Provider: Michael Boston, MD; Technician: Etheleen Sia, NT; Technician: Abbe Amsterdam, NT   Problem List:   Principal Problem:   Status post loop ileostomy takedown 09/22/2017 Active Problems:   Rectal adenocarcinoma s/p protctocolectomy, IPAA "J" pouch 12/01/2016   Anxiety   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 Oxycodone frequency increased and added robaxin  -VTE prophylaxis- SCDs, etc -Mobilize as tolerated to help recovery -Nursing to change dressing today, order in place   09/25/2017    Subjective: (Chief complaint)  Still with pain in right side and right ribs . Has had 4-5 loose stools so far this morning. Walked several labs yesterday. No nausea or bloating.   Objective:  Vital signs:  Vitals:   09/24/17 0700 09/24/17 1344 09/24/17 2147 09/25/17 0523  BP:  111/76 (!) 127/94 113/75  Pulse:  (!) 57 (!) 56 69  Resp:  '18 18 18  '$ Temp:  98.6 F (37 C) 98.3 F (36.8 C) 97.7 F (36.5 C)  TempSrc:  Oral Oral Oral  SpO2:  98% 97% 98%  Weight: 77.4 kg (170 lb 10.2  oz)   75.8 kg (167 lb 1.7 oz)  Height:        Last BM Date: 09/24/17  Intake/Output   Yesterday:  10/06 0701 - 10/07 0700 In: 9179 [P.O.:1170] Out: 525 [Urine:525] This shift:  No intake/output data recorded.  Bowel function:  Flatus: yes  BM:  diarrhea  Drain: (No drain)   Physical Exam: Gen - A&O x 3.  No distress   Resp breathing comfortably. Abd soft, tender around incision and right abdominal wall/ lower ribs, non distended.  Dressing c/d/i.      Results:   Labs: No results found for this or any previous visit (from the past 48 hour(s)).  Imaging / Studies: No results found.  Medications / Allergies: per chart  Antibiotics: Anti-infectives    Start     Dose/Rate Route Frequency Ordered Stop   09/22/17 2200  cefoTEtan (CEFOTAN) 2 g in dextrose 5 % 50 mL IVPB     2 g 100 mL/hr over 30 Minutes Intravenous Every 12 hours 09/22/17 1616 09/22/17 2258   09/22/17 1129  cefoTEtan in Dextrose 5% (CEFOTAN) IVPB 2 g     2 g Intravenous On call to O.R. 09/22/17 1129 09/22/17 1315        Note: Portions of this report may have been transcribed using voice recognition software. Every effort was made to ensure accuracy; however, inadvertent computerized transcription errors may be present.   Any transcriptional errors that result from this process are unintentional.   09/25/2017

## 2017-09-26 MED ORDER — LOPERAMIDE HCL 2 MG PO CAPS: 2.0000 mg | ORAL_CAPSULE | Freq: Three times a day (TID) | ORAL | Status: DC | PRN

## 2017-09-26 NOTE — Progress Notes (Signed)
[name]  [address]  [place], [place] [address] Phone: [phone]  FAX: [phone]      [name] [id number] 04/07/1971  CARE TEAM:  PCP: Patient, No Pcp Per  Outpatient Care Team: Patient Care Team: Patient, No Pcp Per as PCP - General (General Practice) Gross, [name], MD as Consulting Physician (General Surgery) [name], [name], MD as Consulting Physician (Gastroenterology) [name], [name], MD as Consulting Physician (Radiation Oncology) [name], [name], MD as Consulting Physician (Oncology)  Inpatient Treatment Team: Treatment Team: Attending Provider: Gross, [name], MD; Technician: Vanada, Alexandra M, NT; Technician: Mink, Annette A, NT; Registered Nurse: [name], [name], RN   Problem List:   Principal Problem:   Status post loop ileostomy takedown 09/22/2017 Active Problems:   Rectal adenocarcinoma s/p protctocolectomy, IPAA "J" pouch 12/01/2016   Anxiety   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 -Hemoglobin 14.2, no signs of active bleeding -VTE prophylaxis- SCDs, etc -Mobilize as tolerated to help recovery  15 minutes spent in review, evaluation, examination, counseling, and coordination of care.  More than 50% of that time was spent in counseling.  Patient's friend was in the room.  Care plan was communicated.  Both verbalized understanding.  Tyrone Hall-Potvin, [name]  09/26/2017    Subjective: (Chief complaint)  Patient reporting LLQ pain since last night - "like I need to fart or poop or something".   States the pain is intense and brief.  Improves w/ ambulation. Patient states he received dilaudid  for the abd pain - "it helped for only 30 minutes". Able to ambulate in the hall without lightheadedness/dizziness - did so 5 times yesterday. Patient reports sleeping well.  LLQ pain woke him from sleep this morning Patient states he tolerated solid foods well.  Denies nausea/vomiting. No flatus/BM "since 4:30 yesterday".   Objective:  Vital signs:  Vitals:   09/25/17 0523 09/25/17 1634 09/25/17 2316 09/26/17 0452  BP: 113/75 114/84 (!) 142/93 126/90  Pulse: 69 85 85 88  Resp: 18 19 18 18  Temp: 97.7 F (36.5 C) 98.9 F (37.2 C) 98.5 F (36.9 C) 98.5 F (36.9 C)  TempSrc: Oral Oral Oral Oral  SpO2: 98% 94% 95% 100%  Weight: 75.8 kg (167 lb 1.7 oz)   73.3 kg (161 lb 9.6 oz)  Height:        Last BM Date: 09/25/17  Intake/Output   Yesterday:  10/07 0701 - 10/08 0700 In: 240 [P.O.:240] Out: -  This shift:  No intake/output data recorded.  Bowel function:  Flatus: No  BM:  No  Drain: (No drain)   Physical Exam:  General: Pt awake/alert/oriented x4 in no acute distress Eyes: PERRL, normal EOM.  Sclera clear.  No icterus Neuro: CN II-XII intact w/o focal sensory/motor deficits. Lymph: No head/neck/groin lymphadenopathy Psych:  No delerium/psychosis/paranoia HENT: Normocephalic, Mucus membranes moist.  No thrush Neck: Supple, No tracheal deviation Chest:  No chest wall pain w good excursion CV:  Pulses intact.  Regular rhythm MS: Normal AROM mjr joints.  No obvious deformity  Abdomen: Soft.  Nondistended.  TTP at incision and lower abdomen.  No evidence of peritonitis.  No incarcerated hernias.  Ext:   No   deformity.  No mjr edema.  No cyanosis Skin: No petechiae / purpura  Results:   Labs: No results found for this or any previous visit (from the past 48 hour(s)).  Imaging / Studies: No results found.  Medications / Allergies: per chart  Antibiotics: Anti-infectives    Start     Dose/Rate Route Frequency Ordered Stop   09/22/17 2200  cefoTEtan  (CEFOTAN) 2 g in dextrose 5 % 50 mL IVPB     2 g 100 mL/hr over 30 Minutes Intravenous Every 12 hours 09/22/17 1616 09/22/17 2258   09/22/17 1129  cefoTEtan in Dextrose 5% (CEFOTAN) IVPB 2 g     2 g Intravenous On call to O.R. 09/22/17 1129 09/22/17 1315        Note: Portions of this report may have been transcribed using voice recognition software. Every effort was made to ensure accuracy; however, inadvertent computerized transcription errors may be present.   Any transcriptional errors that result from this process are unintentional.     [place], [name]  09/26/2017

## 2017-09-27 NOTE — Discharge Summary (Signed)
Physician Discharge Summary  Patient ID: [name] MRN: [id number] DOB/AGE: 47/19/1972  46 y.o.  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  Postoperatively, the patient gradually mobilized and advanced to a solid diet.  Pain and other symptoms were treated aggressively.    Day of discharge patient resumed passing flatus and stool, was urinating w/o difficulty, and pain was well-controlled w/ PO medication.  Patient denied nausea/vomiting and was ambulating in the halls w/o difficulty.  Appetite and energy were improving as well.  Patient was looking forward to being discharged home.  Patient's friend was in the room.  By the time of discharge, the patient was walking well the hallways, eating food, having flatus.  Pain was well-controlled on an oral medications.  Based on meeting discharge criteria and continuing to recover, I felt it was safe for the patient to be discharged from the hospital to further recover with close followup. Postoperative recommendations were discussed in detail.  They  are written as well.  Discharged Condition: good  Disposition:  Follow-up Information    Michael Boston, MD. Schedule an appointment as soon as possible for a visit in 2 weeks.   Specialty:  General Surgery Why:  To follow up after your operation, To follow up after your hospital stay Contact information: [place] [address] [phone]           01-Home or Self Care  Discharge Instructions    Call MD for:    Complete by:  As directed    FEVER > 101.5 F  (temperatures < 101.5 F are not significant)   Call MD for:    Complete by:  As directed    FEVER > 101.5 F  (temperatures < 101.5 F are not significant)   Call MD for:  extreme fatigue    Complete by:  As directed    Call MD for:  extreme fatigue    Complete by:  As directed    Call MD for:  persistant dizziness or light-headedness    Complete by:  As directed    Call MD for:  persistant dizziness or light-headedness    Complete by:  As directed    Call MD for:  persistant nausea and vomiting    Complete by:  As directed    Call MD for:  persistant nausea and vomiting    Complete by:  As directed    Call MD for:  redness, tenderness, or signs of infection (pain, swelling, redness, odor or green/yellow discharge around incision site)    Complete by:  As directed    Call MD for:  redness, tenderness, or signs  of infection (pain, swelling, redness, odor or green/yellow discharge around incision site)    Complete by:  As directed    Call MD for:  severe uncontrolled pain    Complete by:  As directed    Call MD for:  severe uncontrolled pain    Complete by:  As directed    Diet - low sodium heart healthy    Complete by:  As directed    Follow a light diet the first few days at home.   Start with a bland diet such as soups, liquids, starchy foods, low fat foods, etc.   If you feel full, bloated, or constipated, stay on a full liquid or pureed/blenderized diet for a few days until you feel  better and no longer constipated. Be sure to drink plenty of fluids every day to avoid getting dehydrated (feeling dizzy, not urinating, etc.). Gradually add a fiber supplement to your diet   Diet - low sodium heart healthy    Complete by:  As directed    Follow a light diet the first few days at home.   Start with a bland diet such as soups, liquids, starchy foods, low fat foods, etc.   If you feel full, bloated, or constipated, stay on a full liquid or pureed/blenderized diet for a few days until you feel better and no longer constipated. Be sure to drink plenty of fluids every day to avoid getting dehydrated (feeling dizzy, not urinating, etc.). Gradually add a fiber supplement to your diet   Discharge instructions    Complete by:  As directed    See Discharge Instructions If you are not getting better after two weeks or are noticing you are getting worse, contact our office [phone] for further advice.  We may need to adjust your medications, re-evaluate you in the office, send you to the emergency room, or see what other things we can do to help. The clinic staff is available to answer your questions during regular business hours (8:30am-5pm).  Please don't hesitate to call and ask to speak to one of our nurses for clinical concerns.    A surgeon from [facility] is always on call at the hospitals 24 hours/day If you have a medical emergency, go to the nearest emergency room or call 911.   Discharge instructions    Complete by:  As directed    See Discharge Instructions If you are not getting better after two weeks or are noticing you are getting worse, contact our office [phone] for further advice.  We may need to adjust your medications, re-evaluate you in the office, send you to the emergency room, or see what other things we can do to help. The clinic staff is available to answer your questions during regular business hours (8:30am-5pm).  Please don't hesitate  to call and ask to speak to one of our nurses for clinical concerns.    A surgeon from [facility] is always on call at the hospitals 24 hours/day If you have a medical emergency, go to the nearest emergency room or call 911.   Driving Restrictions    Complete by:  As directed    You may drive when you are no longer taking narcotic prescription pain medication, you can comfortably wear a seatbelt, and you can safely make sudden turns/stops to protect yourself without hesitating due to pain.   Driving Restrictions    Complete by:  As directed    You may drive  when you are no longer taking narcotic prescription pain medication, you can comfortably wear a seatbelt, and you can safely make sudden turns/stops to protect yourself without hesitating due to pain.   Increase activity slowly    Complete by:  As directed    Start light daily activities --- self-care, walking, climbing stairs- beginning the day after surgery.  Gradually increase activities as tolerated.  Control your pain to be active.  Stop when you are tired.  Ideally, walk several times a day, eventually an hour a day.   Most people are back to most day-to-day activities in a few weeks.  It takes 4-8 weeks to get back to unrestricted, intense activity. If you can walk 30 minutes without difficulty, it is safe to try more intense activity such as jogging, treadmill, bicycling, low-impact aerobics, swimming, etc. Save the most intensive and strenuous activity for last (Usually 4-8 weeks after surgery) such as sit-ups, heavy lifting, contact sports, etc.  Refrain from any intense heavy lifting or straining until you are off narcotics for pain control.  You will have off days, but things should improve week-by-week. DO NOT PUSH THROUGH PAIN.  Let pain be your guide: If it hurts to do something, don't do it.  Pain is your body warning you to avoid that activity for another week until the pain goes down.   Increase activity slowly     Complete by:  As directed    Start light daily activities --- self-care, walking, climbing stairs- beginning the day after surgery.  Gradually increase activities as tolerated.  Control your pain to be active.  Stop when you are tired.  Ideally, walk several times a day, eventually an hour a day.   Most people are back to most day-to-day activities in a few weeks.  It takes 4-8 weeks to get back to unrestricted, intense activity. If you can walk 30 minutes without difficulty, it is safe to try more intense activity such as jogging, treadmill, bicycling, low-impact aerobics, swimming, etc. Save the most intensive and strenuous activity for last (Usually 4-8 weeks after surgery) such as sit-ups, heavy lifting, contact sports, etc.  Refrain from any intense heavy lifting or straining until you are off narcotics for pain control.  You will have off days, but things should improve week-by-week. DO NOT PUSH THROUGH PAIN.  Let pain be your guide: If it hurts to do something, don't do it.  Pain is your body warning you to avoid that activity for another week until the pain goes down.   Lifting restrictions    Complete by:  As directed    If you can walk 30 minutes without difficulty, it is safe to try more intense activity such as jogging, treadmill, bicycling, low-impact aerobics, swimming, etc. Save the most intensive and strenuous activity for last (Usually 4-8 weeks after surgery) such as sit-ups, heavy lifting, contact sports, etc.  Refrain from any intense heavy lifting or straining until you are off narcotics for pain control.  You will have off days, but things should improve week-by-week. DO NOT PUSH THROUGH PAIN.  Let pain be your guide: If it hurts to do something, don't do it.  Pain is your body warning you to avoid that activity for another week until the pain goes down.   Lifting restrictions    Complete by:  As directed    If you can walk 30 minutes without difficulty, it is safe to try more  intense activity such as jogging, treadmill, bicycling,  low-impact aerobics, swimming, etc. Save the most intensive and strenuous activity for last (Usually 4-8 weeks after surgery) such as sit-ups, heavy lifting, contact sports, etc.  Refrain from any intense heavy lifting or straining until you are off narcotics for pain control.  You will have off days, but things should improve week-by-week. DO NOT PUSH THROUGH PAIN.  Let pain be your guide: If it hurts to do something, don't do it.  Pain is your body warning you to avoid that activity for another week until the pain goes down.   May walk up steps    Complete by:  As directed    May walk up steps    Complete by:  As directed    No wound care    Complete by:  As directed    It is good for closed incision and even open wounds to be washed every day.  Shower every day.  Short baths are fine.  Wash the incisions and wounds clean with soap & water.    If you have a closed incision(s), wash the incision with soap & water every day.  You may leave closed incisions open to air if it is dry.   You may cover the incision with clean gauze & replace it after your daily shower for comfort. If you have skin tapes (Steristrips) or skin glue (Dermabond) on your incision, leave them in place.  They will fall off on their own like a scab.  You may trim any edges that curl up with clean scissors.  If you have staples, set up an appointment for them to be removed in the office in 10 days after surgery.  If you have a drain, wash around the skin exit site with soap & water and place a new dressing of gauze or band aid around the skin every day.  Keep the drain site clean & dry.   No wound care    Complete by:  As directed    It is good for closed incision and even open wounds to be washed every day.  Shower every day.  Short baths are fine.  Wash the incisions and wounds clean with soap & water.    If you have a closed incision(s), wash the incision with soap & water  every day.  You may leave closed incisions open to air if it is dry.   You may cover the incision with clean gauze & replace it after your daily shower for comfort. If you have skin tapes (Steristrips) or skin glue (Dermabond) on your incision, leave them in place.  They will fall off on their own like a scab.  You may trim any edges that curl up with clean scissors.  If you have staples, set up an appointment for them to be removed in the office in 10 days after surgery.  If you have a drain, wash around the skin exit site with soap & water and place a new dressing of gauze or band aid around the skin every day.  Keep the drain site clean & dry.   Sexual Activity Restrictions    Complete by:  As directed    You may have sexual intercourse when it is comfortable. If it hurts to do something, stop.   Sexual Activity Restrictions    Complete by:  As directed    You may have sexual intercourse when it is comfortable. If it hurts to do something, stop.      Allergies as of  09/27/2017   No Known Allergies     Medication List    TAKE these medications   ALPRAZolam 0.5 MG tablet Commonly known as:  XANAX Take 0.5 mg by mouth 2 (two) times daily as needed for anxiety or sleep.   diphenoxylate-atropine 2.5-0.025 MG tablet Commonly known as:  LOMOTIL Take 1-2 tablets by mouth 4 (four) times daily as needed for diarrhea or loose stools. What changed:  how much to take   gabapentin 100 MG capsule Commonly known as:  NEURONTIN Take 3 capsules (300 mg total) by mouth 3 (three) times daily.   methocarbamol 750 MG tablet Commonly known as:  ROBAXIN Take 1 tablet (750 mg total) by mouth 4 (four) times daily as needed (use for muscle cramps/pain).   traMADol 50 MG tablet Commonly known as:  ULTRAM Take 1-2 tablets (50-100 mg total) by mouth every 6 (six) hours as needed for moderate pain or severe pain.       Significant Diagnostic Studies:  No results found for this or any previous visit  (from the past 72 hour(s)).  No results found.  Discharge Exam: Blood pressure 120/81, pulse 97, temperature 98.2 F (36.8 C), temperature source Oral, resp. rate 18, height _0  (1.803 m), weight 73.3 kg (161 lb 9.6 oz), SpO2 98 %.  General: Pt awake/alert/oriented x4 in No acute distress Eyes: PERRL, normal EOM.  Sclera clear.  No icterus Neuro: CN II-XII intact w/o focal sensory/motor deficits. Lymph: No head/neck/groin lymphadenopathy Psych:  No delerium/psychosis/paranoia HENT: Normocephalic, Mucus membranes moist.  No thrush Neck: Supple, No tracheal deviation Chest:  No chest wall pain w good excursion CV:  Pulses intact.  Regular rhythm MS: Normal AROM mjr joints.  No obvious deformity Abdomen: Soft.  Nondistended.  Tenderness at incision.  Incision clean, dry, intact.  No evidence of peritonitis.  No incarcerated hernias. Ext:  SCDs BLE.  No mjr edema.  No cyanosis Skin: No petechiae / purpura  Past Medical History:  Diagnosis Date  . Anxiety   . Cancer ([place]) 12/01/2016   colon   . Family history of colon cancer   . Poor dental hygiene     Past Surgical History:  Procedure Laterality Date  . EUS N/A 07/29/2016   Procedure: LOWER ENDOSCOPIC ULTRASOUND (EUS);  Surgeon: Milus Banister, MD;  Location: [facility];  Service: Endoscopy;  Laterality: N/A;  . ILEOSTOMY CLOSURE N/A 09/22/2017   Procedure: TAKEDOWN OF LOOP ILEOSTOMY;  Surgeon: Michael Boston, MD;  Location: [facility];  Service: General;  Laterality: N/A;  . PORTACATH PLACEMENT N/A 01/26/2017   Procedure: PLACEMENT OF PORT-A-CATH CENTRAL LINE WITH FLUOROSCOPY AND ULTRASOUND;  Surgeon: Michael Boston, MD;  Location: [facility];  Service: General;  Laterality: N/A;  . POUCHOSCOPY N/A 09/21/2017   Procedure: POUCHOSCOPY;  Surgeon: Leighton Ruff, MD;  Location: [facility];  Service: Endoscopy;  Laterality: N/A;  . PROCTOSCOPY N/A 12/01/2016   Procedure: RIGID PROCTOSCOPY;  Surgeon: Michael Boston, MD;   Location: [facility];  Service: General;  Laterality: N/A;  . TOE AMPUTATION Left   . XI ROBOTIC ASSISTED LOWER ANTERIOR RESECTION N/A 12/01/2016   Procedure: XI ROBOTIC ASSISTED PROCTOCOLECTOMY WITH ILLEOPOUCH ANASTAMOSIS WITH DIVERTING ILLEOSTOMY;  Surgeon: Michael Boston, MD;  Location: [facility];  Service: General;  Laterality: N/A;    Social History   Social History  . Marital status: Married    Spouse name: N/A  . Number of children: 3  . Years of education: N/A   Occupational History  .  Not on file.   Social History Main Topics  . Smoking status: Former Smoker    Packs/day: 1.50    Years: 30.00    Types: Cigarettes    Quit date: 12/01/2016  . Smokeless tobacco: Never Used  . Alcohol use No  . Drug use: No     Comment: patient denies  . Sexual activity: Not on file   Other Topics Concern  . Not on file   Social History Narrative   Lives with significant other, [name]   Have a 77 year old son   Smoker      [place]    Family History  Problem Relation Age of Onset  . Diabetes Mother   . COPD Father   . Colon cancer Maternal Aunt        dx in her 42s  . Diabetes Maternal Grandmother   . Brain cancer Maternal Grandfather   . Lung cancer Paternal Grandfather   . Colon cancer Cousin        dx in her 42s  . Bone cancer Sister 8  . Pancreatic cancer Neg Hx   . Esophageal cancer Neg Hx   . Stomach cancer Neg Hx   . Liver disease Neg Hx     Current Facility-Administered Medications  Medication Dose Route Frequency Provider Last Rate Last Dose  . acetaminophen (TYLENOL) tablet 1,000 mg  1,000 mg Oral TID Michael Boston, MD   1,000 mg at 09/26/17 2111  . ALPRAZolam Duanne Moron) tablet 0.5 mg  0.5 mg Oral BID PRN Michael Boston, MD   0.5 mg at 09/25/17 2247  . alum & mag hydroxide-simeth (MAALOX/MYLANTA) 200-200-20 MG/5ML suspension 30 mL  30 mL Oral Q6H PRN Michael Boston, MD   30 mL at 09/22/17 2301  . bismuth subsalicylate (PEPTO BISMOL) 262  MG/15ML suspension 30 mL  30 mL Oral Q8H PRN Michael Boston, MD      . diphenhydrAMINE (BENADRYL) 12.5 MG/5ML elixir 12.5 mg  12.5 mg Oral Q6H PRN Michael Boston, MD       Or  . diphenhydrAMINE (BENADRYL) injection 12.5 mg  12.5 mg Intravenous Q6H PRN Michael Boston, MD      . diphenoxylate-atropine (LOMOTIL) 2.5-0.025 MG per tablet 1 tablet  1 tablet Oral TID Clovis Riley, MD   1 tablet at 09/26/17 2112  . enoxaparin (LOVENOX) injection 40 mg  40 mg Subcutaneous Q24H Michael Boston, MD   40 mg at 09/26/17 0908  . feeding supplement (ENSURE SURGERY) liquid 237 mL  237 mL Oral BID BM Michael Boston, MD   237 mL at 09/26/17 1354  . gabapentin (NEURONTIN) capsule 300 mg  300 mg Oral TID Michael Boston, MD   300 mg at 09/26/17 2111  . guaiFENesin-dextromethorphan (ROBITUSSIN DM) 100-10 MG/5ML syrup 10 mL  10 mL Oral Q4H PRN Michael Boston, MD      . hydrALAZINE (APRESOLINE) injection 5-20 mg  5-20 mg Intravenous Q6H PRN Michael Boston, MD      . hydrocortisone (ANUSOL-HC) 2.5 % rectal cream 1 application  1 application Topical QID PRN Michael Boston, MD      . hydrocortisone cream 1 % 1 application  1 application Topical TID PRN Michael Boston, MD      . HYDROmorphone (DILAUDID) injection 0.5-2 mg  0.5-2 mg Intravenous Q2H PRN Michael Boston, MD   1 mg at 09/26/17 1640  . lip balm (CARMEX) ointment 1 application  1 application Topical BID Michael Boston, MD   1  application at 77/37/50 2112  . loperamide (IMODIUM) capsule 2 mg  2 mg Oral Q8H PRN Michael Boston, MD      . menthol-cetylpyridinium (CEPACOL) lozenge 3 mg  1 lozenge Oral PRN Michael Boston, MD      . methocarbamol (ROBAXIN) tablet 500 mg  500 mg Oral QID Romana Juniper A, MD   500 mg at 09/26/17 2111  . metoCLOPramide (REGLAN) injection 5-10 mg  5-10 mg Intravenous Q6H PRN Michael Boston, MD      . metoprolol tartrate (LOPRESSOR) injection 5 mg  5 mg Intravenous Q6H PRN Michael Boston, MD      . ondansetron [facility]) tablet 4 mg  4 mg Oral Q6H PRN  Michael Boston, MD       Or  . ondansetron (ZOFRAN) injection 4 mg  4 mg Intravenous Q6H PRN Michael Boston, MD      . phenol (CHLORASEPTIC) mouth spray 1-2 spray  1-2 spray Mouth/Throat PRN Michael Boston, MD      . prochlorperazine (COMPAZINE) injection 5-10 mg  5-10 mg Intravenous Q4H PRN Michael Boston, MD      . saccharomyces boulardii (FLORASTOR) capsule 250 mg  250 mg Oral BID Michael Boston, MD   250 mg at 09/26/17 2111  . traMADol (ULTRAM) tablet 50-100 mg  50-100 mg Oral Q6H PRN Michael Boston, MD   100 mg at 09/27/17 0540  . zolpidem (AMBIEN) tablet 5 mg  5 mg Oral QHS PRN,MR X 1 Michael Boston, MD         No Known Allergies  Signed: [place], [name]    09/27/2017, 7:57 AM

## 2017-09-27 NOTE — Progress Notes (Signed)
Pt alert, oriented, ambulating and tolerating heart healthy diet with no complaints. D/C instructions and prescription given.  All questions answered.

## 2017-09-27 NOTE — Progress Notes (Signed)
[address] [name], [name] Phone: [phone]  FAX: [phone]      [name] [id number] September 19, 1971  CARE TEAM:  PCP: Patient, No Pcp Per  Outpatient Care Team: Patient Care Team: Patient, No Pcp Per as PCP - General (General Practice) Michael Boston, MD as Consulting Physician (General Surgery) [place], Kirke Corin, MD as Consulting Physician (Gastroenterology) Kyung Rudd, MD as Consulting Physician (Radiation Oncology) Truitt Merle, MD as Consulting Physician (Oncology)  Inpatient Treatment Team: Treatment Team: Attending Provider: Michael Boston, MD; Technician: Etheleen Sia, NT; Technician: Abbe Amsterdam, NT   Problem List:   Principal Problem:   Status post loop ileostomy takedown 09/22/2017 Active Problems:   Rectal adenocarcinoma s/p protctocolectomy, 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  If well tolerated, will plan to discharge home later this afternoon. -Maintain adequate oral pain control so patient is able to continue ambulating, sleeping, etc. -Hemoglobin 14.2, no signs of active bleeding -VTE prophylaxis- SCDs, etc -Mobilize as tolerated to help recovery  15 minutes spent in review, evaluation, examination, counseling, and coordination of care.  More than 50% of that time was spent in counseling.  Care plan was communicated.  Patient verbalized understanding.  [place], [name]  09/27/2017    Subjective: (Chief complaint)  Patient has resumed passing flatus and stool. Pain is well controlled w/ PO tramadol - patient states he was able to sleep through the night. Able to ambulate in the hall without  lightheadedness/dizziness - did 9 laps yesterday. Patient states he tolerated full liquid diet well.  Denies nausea/vomiting.    Objective:  Vital signs:  Vitals:   09/26/17 0452 09/26/17 1419 09/26/17 2200 09/27/17 0533  BP: 126/90 (!) 132/92 109/62 120/81  Pulse: 88 76 80 97  Resp: _0 Temp: 98.5 F (36.9 C) 98 F (36.7 C) 97.8 F (36.6 C) 98.2 F (36.8 C)  TempSrc: Oral Oral Oral Oral  SpO2: 100% 96% 96% 98%  Weight: 73.3 kg (161 lb 9.6 oz)     Height:        Last BM Date: 09/26/17  Intake/Output   Yesterday:  10/08 0701 - 10/09 0700 In: 120 [P.O.:120] Out: -  This shift:  No intake/output data recorded.  Bowel function:  Flatus: YES  BM:  YES  Drain: (No drain)   Physical Exam:  General: Pt awake/alert/oriented x4 in no acute distress Eyes: PERRL, normal EOM.  Sclera clear.  No icterus Neuro: CN II-XII intact w/o focal sensory/motor deficits. Lymph: No head/neck/groin lymphadenopathy Psych:  No delerium/psychosis/paranoia HENT: Normocephalic, Mucus membranes moist.  No thrush Neck: Supple, No tracheal deviation Chest:  No chest wall pain w good excursion CV:  Pulses intact.  Regular rhythm MS: Normal AROM mjr joints.  No obvious deformity  Abdomen: Soft.  Nondistended.  TTP at incision and lower abdomen.  No evidence of peritonitis.  No incarcerated hernias.  Ext:   No deformity.  No mjr edema.  No cyanosis Skin: No petechiae / purpura  Results:   Labs: No results found for this or any previous visit (from the past 48 hour(s)).  Imaging / Studies: No results found.  Medications /  Allergies: per chart  Antibiotics: Anti-infectives    Start     Dose/Rate Route Frequency Ordered Stop   09/22/17 2200  cefoTEtan (CEFOTAN) 2 g in dextrose 5 % 50 mL IVPB     2 g 100 mL/hr over 30 Minutes Intravenous Every 12 hours 09/22/17 1616 09/22/17 2258   09/22/17 1129  cefoTEtan in Dextrose 5% (CEFOTAN) IVPB 2 g     2 g Intravenous On call to  O.R. 09/22/17 1129 09/22/17 1315        Note: Portions of this report may have been transcribed using voice recognition software. Every effort was made to ensure accuracy; however, inadvertent computerized transcription errors may be present.   Any transcriptional errors that result from this process are unintentional.     [place], [name]  09/27/2017

## 2017-09-30 ENCOUNTER — Ambulatory Visit (HOSPITAL_BASED_OUTPATIENT_CLINIC_OR_DEPARTMENT_OTHER): Payer: Medicaid Other

## 2017-09-30 ENCOUNTER — Other Ambulatory Visit (HOSPITAL_BASED_OUTPATIENT_CLINIC_OR_DEPARTMENT_OTHER): Payer: Medicaid Other

## 2017-09-30 DIAGNOSIS — C20 Malignant neoplasm of rectum: Secondary | ICD-10-CM | POA: Diagnosis not present

## 2017-09-30 LAB — CBC WITH DIFFERENTIAL/PLATELET
BASO%: 0.6 % (ref 0.0–2.0)
Basophils Absolute: 0 10*3/uL (ref 0.0–0.1)
EOS%: 2.3 % (ref 0.0–7.0)
Eosinophils Absolute: 0.1 10*3/uL (ref 0.0–0.5)
HEMATOCRIT: 43.1 % (ref 38.4–49.9)
HGB: 14.4 g/dL (ref 13.0–17.1)
LYMPH#: 0.7 10*3/uL — AB (ref 0.9–3.3)
LYMPH%: 12.9 % — ABNORMAL LOW (ref 14.0–49.0)
MCH: 29.2 pg (ref 27.2–33.4)
MCHC: 33.5 g/dL (ref 32.0–36.0)
MCV: 87.2 fL (ref 79.3–98.0)
MONO#: 0.7 10*3/uL (ref 0.1–0.9)
MONO%: 12.3 % (ref 0.0–14.0)
NEUT#: 4.1 10*3/uL (ref 1.5–6.5)
NEUT%: 71.9 % (ref 39.0–75.0)
PLATELETS: 191 10*3/uL (ref 140–400)
RBC: 4.94 10*6/uL (ref 4.20–5.82)
RDW: 15.3 % — AB (ref 11.0–14.6)
WBC: 5.6 10*3/uL (ref 4.0–10.3)

## 2017-09-30 LAB — COMPREHENSIVE METABOLIC PANEL
ALT: 15 U/L (ref 0–55)
ANION GAP: 8 meq/L (ref 3–11)
AST: 18 U/L (ref 5–34)
Albumin: 2.5 g/dL — ABNORMAL LOW (ref 3.5–5.0)
Alkaline Phosphatase: 62 U/L (ref 40–150)
BUN: 11.8 mg/dL (ref 7.0–26.0)
CALCIUM: 8.4 mg/dL (ref 8.4–10.4)
CO2: 21 mEq/L — ABNORMAL LOW (ref 22–29)
CREATININE: 0.8 mg/dL (ref 0.7–1.3)
Chloride: 108 mEq/L (ref 98–109)
Glucose: 100 mg/dl (ref 70–140)
POTASSIUM: 3.8 meq/L (ref 3.5–5.1)
Sodium: 138 mEq/L (ref 136–145)
Total Bilirubin: 0.36 mg/dL (ref 0.20–1.20)
Total Protein: 5.5 g/dL — ABNORMAL LOW (ref 6.4–8.3)

## 2017-09-30 LAB — TECHNOLOGIST REVIEW

## 2017-09-30 LAB — CEA (IN HOUSE-CHCC): CEA (CHCC-In House): 1.28 ng/mL (ref 0.00–5.00)

## 2017-09-30 MED ORDER — HEPARIN SOD (PORK) LOCK FLUSH 100 UNIT/ML IV SOLN
500.0000 [IU] | Freq: Once | INTRAVENOUS | Status: AC | PRN
  Administered 2017-09-30: 500 [IU]
  Filled 2017-09-30: qty 5

## 2017-09-30 MED ORDER — SODIUM CHLORIDE 0.9% FLUSH
10.0000 mL | INTRAVENOUS | Status: DC | PRN
  Administered 2017-09-30: 10 mL
  Filled 2017-09-30: qty 10

## 2017-10-02 ENCOUNTER — Other Ambulatory Visit: Payer: Self-pay | Admitting: Hematology

## 2017-10-02 DIAGNOSIS — Z1509 Genetic susceptibility to other malignant neoplasm: Secondary | ICD-10-CM

## 2017-10-02 DIAGNOSIS — C20 Malignant neoplasm of rectum: Secondary | ICD-10-CM

## 2017-10-29 ENCOUNTER — Other Ambulatory Visit: Payer: Self-pay | Admitting: Hematology

## 2017-10-29 DIAGNOSIS — C20 Malignant neoplasm of rectum: Secondary | ICD-10-CM

## 2017-10-29 DIAGNOSIS — Z1509 Genetic susceptibility to other malignant neoplasm: Secondary | ICD-10-CM

## 2017-11-01 ENCOUNTER — Other Ambulatory Visit: Payer: Self-pay | Admitting: *Deleted

## 2017-11-01 ENCOUNTER — Telehealth: Payer: Self-pay

## 2017-11-01 MED ORDER — GABAPENTIN 300 MG PO CAPS: 300.0000 mg | ORAL_CAPSULE | Freq: Three times a day (TID) | ORAL | 1 refills | Status: DC

## 2017-11-01 NOTE — Telephone Encounter (Signed)
Electronic refill request for gabapentin came in. 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 He is taking 300 mg TID. he said it is helping "somewhat". Need authorization to make this change please.

## 2017-11-01 NOTE — Telephone Encounter (Signed)
[name], I took care of this.

## 2017-11-02 ENCOUNTER — Other Ambulatory Visit: Payer: Self-pay | Admitting: Hematology

## 2017-11-02 DIAGNOSIS — C20 Malignant neoplasm of rectum: Secondary | ICD-10-CM

## 2017-11-02 DIAGNOSIS — Z1509 Genetic susceptibility to other malignant neoplasm: Secondary | ICD-10-CM

## 2017-11-03 NOTE — Telephone Encounter (Signed)
Incoming interface with [facility] for lomotil refill. Called in.

## 2017-11-17 NOTE — Progress Notes (Addendum)
[place]  Telephone:([phone]) [phone] Fax:(336) [phone]  Clinic follow Up Note   Patient Care Team: Patient, No Pcp Per as PCP - General (General Practice) Michael Boston, MD as Consulting Physician (General Surgery) [name], Kirke Corin, MD as Consulting Physician (Gastroenterology) Kyung Rudd, MD as Consulting Physician (Radiation Oncology) Truitt Merle, MD as Consulting Physician (Oncology) 11/18/2017  CHIEF COMPLAINTS:  Follow up rectal cancer  Oncology History   Rectal adenocarcinoma [facility])   Staging form: Colon and Rectum, AJCC 7th Edition   - Clinical stage from 07/28/2016: Stage IIIB (T3, N2a, M0) - Signed by Truitt Merle, MD on 08/06/2016      Rectal adenocarcinoma s/p protctocolectomy, IPAA "J" pouch 12/01/2016   07/26/2016 Imaging    CT chest, abdomen and pelvis with contrast showed nodular thickening of the rectal wall, enlarged perirectal lymph nodes. Diverticulosis. Nonspecific low-level lymphadenopathy. Lower lobe emphysema, small 3-4 mm left lower lobe lung nodules.      07/28/2016 Initial Diagnosis    Rectal adenocarcinoma ([place])      07/28/2016 Initial Biopsy    Rectal mass biopsy showed invasive adenocarcinoma, polyps in the ascending colon and rectum showed tubular adenoma       07/28/2016 Procedure    Colonoscopy showed a nearly circumferential mass in the distal rectum, 4 polyps in the descending colon, diverticulosis in the left colon.       07/29/2016 Procedure    Low EUS showed clearly malignant 6 cm long, nearly circumferential mass in the distal rectum, with distal edge located to 1 cm from the annual approach, multiple prior rectal lymph nodes (6) are suspicious ,uT3 N2       08/17/2016 - 09/24/2016 Radiation Therapy    Neoadjuvant radiation to his rectal cancer 1) Rectum/ 45 Gy in 25 fx                      2) Boost/ 5.4 Gy in 3 fx       08/17/2016 - 09/24/2016 Chemotherapy    Xeloda 1500 mg twice daily with concurrent irradiation      08/18/2016 Imaging    PET scan showed hypermetabolic rectal mass, perirectal node 61m with mild increased uptake, no hypermetabolic distant metastasis, small pulmonary nodules are too small to characterize by PET.       11/29/2016 Imaging    CT of the C/A/P W Contrast IMPRESSION: 1. Interval treatment changes within the perirectal fat with otherwise stable rectosigmoid colon wall thickening. 2. No progressive adenopathy or distant metastases identified. 3. Stable bilateral pulmonary nodules. Short-term stability (for 4 months) is reassuring, although the nodules are too small to optimally characterize by previous PET-CT. Continued CT follow-up recommended.      12/01/2016 Surgery    Colon total resection and proctocolectomy for rectal cancer and Lynch syndrome       12/01/2016 Pathology Results    Proctocolectomy showed scattered small residual foci of invasive moderately differentiated adenocarcinoma embedded in submucosa tissue with extensive neoadjuvant effect, 2.5 cm in greatest time mention, adenocarcinoma invades through muscularis mucosa and involves subserosal soft tissue, margins are negative, 2 of 35 lymph nodes are positive for metastatic adenocarcinoma, 2 calcified and necrotic soft tissue nodules are also present without visible tumor seen.      01/07/2017 - 05/26/2017 Chemotherapy    Adjuvant chemo CAPOX, changed to FOLFOX from cycle 2 due to poor tolerance, a total of 4.5 months therapy        01/26/2017 Surgery  PLACEMENT OF PORT-A-CATH CENTRAL LINE WITH FLUOROSCOPY AND ULTRASOUND      06/21/2017 Imaging    CT Chest w contrast 06/21/2017 IMPRESSION: 1. No evidence of metastatic disease. 2.  Emphysema (ICD10-J43.9). 3. Scattered subpleural pulmonary nodules measure 5 mm or less in size, stable and likely subpleural lymph nodes.      09/21/2017 Procedure    Pouchoscopy by Dr. Marcello Moores on 09/21/17 IMPRESSION  - The examined portion of the ileum was normal. - No  specimens collected.      09/22/2017 Surgery    TAKEDOWN OF LOOP ILEOSTOMY by Dr. Johney Maine on 09/22/17  Diagnosis 09/22/17  Ileostomy, loop SKIN AND COLONIC MUCOSA NEGATIVE FOR CARCINOMA       HISTORY OF PRESENTING ILLNESS:  [name] 46 y.o. male is here because of His newly diagnosed rectal cancer. He is accompanied by his girlfriend to our multidisciplinary GI clinic today.  He has had rectal pain for 6 weeks, and mild rectal bleeding with BM, he goes 4 times a day, very little each time, and pencil shaped stool, he has lost 30 lbs so far. His appetite remains to be good, he eats well, no cough, nausea, no abd pain. He has a moderate fatigue, he will 2 tolerate his routine activities including his job without much difficulty. He does not have a primary care physician, and has not seen doctors for many years.  He presents to emergency room on 07/26/2016 for the rectal pain and bleeding, and was referred to gastroenterologist Dr. Loletha Carrow. He underwent colonoscopy on 07/28/2016, which showed 4 polyps in the ascending colon, and a circumferential distal rectal mass. Biopsy of the rectal mass showed adenocarcinoma. He underwent EUS by Dr. Ardis Hughs on 07/29/2016 which showed a T3 N2 disease.   CURRENT THERAPY: Surveillance   INTERIM HISTORY  [name] returns for follow-up post ileostomy reversal. He presents to the clinic today with his girlfriend.  He notes since reversal he still has significant diarrhea. After meal is when this occurs most. He takes 8 imodium and 6 lomotil a day. He denies residual pain from surgery. He denies nausea, cough, chest pain or any discomfort. He would like more XANAX pills for his anxiety and depression. He notes after cancer diagnosis this depression hit worse. He has not returned to work as he has not had a good day. He notes he does not have daily Anxiety, he will take XANAX 3-4 times a week and this also helps to sleep. He does not have PCP but his wife is looking  for one. He will see Dr. Johney Maine in 11/2017. His wife would like to him to get a flu shot today.     MEDICAL HISTORY:  Past Medical History:  Diagnosis Date  . Anxiety   . Cancer ([place]) 12/01/2016   colon   . Family history of colon cancer   . Poor dental hygiene        SURGICAL HISTORY: Past Surgical History:  Procedure Laterality Date  . EUS N/A 07/29/2016   Procedure: LOWER ENDOSCOPIC ULTRASOUND (EUS);  Surgeon: Milus Banister, MD;  Location: [facility];  Service: Endoscopy;  Laterality: N/A;  . ILEOSTOMY CLOSURE N/A 09/22/2017   Procedure: TAKEDOWN OF LOOP ILEOSTOMY;  Surgeon: Michael Boston, MD;  Location: [facility];  Service: General;  Laterality: N/A;  . PORTACATH PLACEMENT N/A 01/26/2017   Procedure: PLACEMENT OF PORT-A-CATH CENTRAL LINE WITH FLUOROSCOPY AND ULTRASOUND;  Surgeon: Michael Boston, MD;  Location: [place];  Service: General;  Laterality: N/A;  . POUCHOSCOPY N/A 09/21/2017   Procedure: POUCHOSCOPY;  Surgeon: Leighton Ruff, MD;  Location: [facility];  Service: Endoscopy;  Laterality: N/A;  . PROCTOSCOPY N/A 12/01/2016   Procedure: RIGID PROCTOSCOPY;  Surgeon: Michael Boston, MD;  Location: [facility];  Service: General;  Laterality: N/A;  . TOE AMPUTATION Left   . XI ROBOTIC ASSISTED LOWER ANTERIOR RESECTION N/A 12/01/2016   Procedure: XI ROBOTIC ASSISTED PROCTOCOLECTOMY WITH ILLEOPOUCH ANASTAMOSIS WITH DIVERTING ILLEOSTOMY;  Surgeon: Michael Boston, MD;  Location: [facility];  Service: General;  Laterality: N/A;    SOCIAL HISTORY: Social History   Socioeconomic History  . Marital status: Married    Spouse name: Not on file  . Number of children: 3  . Years of education: Not on file  . Highest education level: Not on file  Social Needs  . Financial resource strain: Not on file  . Food insecurity - worry: Not on file  . Food insecurity - inability: Not on file  . Transportation needs - medical: Not on file  . Transportation needs - non-medical: Not on  file  Occupational History  . Not on file  Tobacco Use  . Smoking status: Former Smoker    Packs/day: 1.50    Years: 30.00    Pack years: 45.00    Types: Cigarettes    Last attempt to quit: 12/01/2016    Years since quitting: 0.9  . Smokeless tobacco: Never Used  Substance and Sexual Activity  . Alcohol use: No  . Drug use: No    Comment: patient denies  . Sexual activity: Not on file  Other Topics Concern  . Not on file  Social History Narrative   Lives with significant other, [name]   Have a 27 year old son   Smoker      [place] HISTORY: Family History  Problem Relation Age of Onset  . Diabetes Mother   . COPD Father   . Colon cancer Maternal Aunt        dx in her 52s  . Diabetes Maternal Grandmother   . Brain cancer Maternal Grandfather   . Lung cancer Paternal Grandfather   . Colon cancer Cousin        dx in her 80s  . Bone cancer Sister 8  . Pancreatic cancer Neg Hx   . Esophageal cancer Neg Hx   . Stomach cancer Neg Hx   . Liver disease Neg Hx    He lives with his girlfriend and their 16 yo son, works for [employer]   ALLERGIES:  has No Known Allergies.  MEDICATIONS:  Current Outpatient Medications  Medication Sig Dispense Refill  . ALPRAZolam (XANAX) 0.5 MG tablet Take 1 tablet (0.5 mg total) by mouth 2 (two) times daily as needed for anxiety or sleep. 30 tablet 1  . diphenoxylate-atropine (LOMOTIL) 2.5-0.025 MG tablet TAKE 1 TO 2 TABLETS BY MOUTH 4 TIMES A DAY AS NEEDED FOR DIARRHEA OR LOOSE STOOLS 180 tablet 2  . ferrous sulfate 325 (65 FE) MG tablet Take 325 mg by mouth daily with breakfast.    . gabapentin (NEURONTIN) 300 MG capsule Take 1 capsule (300 mg total) 3 (three) times daily by mouth. 90 capsule 1  . loperamide (IMODIUM) 2 MG capsule Take 2 mg by mouth as needed for diarrhea or loose stools.     Current Facility-Administered Medications  Medication Dose Route Frequency Provider Last Rate Last Dose  . 0.9 %  sodium chloride infusion  500 mL Intravenous Continuous [name], Kirke Corin, MD       Facility-Administered Medications Ordered in Other Visits  Medication Dose Route Frequency Provider Last Rate Last Dose  . sodium chloride flush (NS) 0.9 % injection 10 mL  10 mL Intravenous PRN Truitt Merle, MD   10 mL at 11/18/17 2297    REVIEW OF SYSTEMS:  Constitutional: Denies fevers, chills or abnormal night sweats  Eyes: Denies blurriness of vision, double vision or watery eyes Ears, nose, mouth, throat, and face: Denies mucositis or sore throat Respiratory: Denies cough, dyspnea or wheezes Cardiovascular: Denies palpitation, chest discomfort or lower extremity swelling Gastrointestinal:  Denies heartburn (+) significant diarrhea  Skin: Denies abnormal skin rashes Lymphatics: Denies new lymphadenopathy or easy bruising Neurological: negative  Behavioral/Psych: (+) anxiety/depression All other systems were reviewed with the patient and are negative.  PHYSICAL EXAMINATION:  ECOG PERFORMANCE STATUS: 1  Vitals:   11/18/17 1016  BP: 99/67  Pulse: 67  Resp: 17  Temp: 98.3 F (36.8 C)  SpO2: 97%   Filed Weights   11/18/17 1016  Weight: 156 lb 14.4 oz (71.2 kg)     GENERAL:alert, no distress and comfortable SKIN: skin color, texture, turgor are normal, no rashes or significant lesions EYES: normal, conjunctiva are pink and non-injected, sclera clear OROPHARYNX:no exudate, no erythema and lips, buccal mucosa, and tongue normal  NECK: supple, thyroid normal size, non-tender, without nodularity LYMPH:  no palpable lymphadenopathy in the cervical, axillary or inguinal LUNGS: clear to auscultation and percussion with normal breathing effort HEART: regular rate & rhythm and no murmurs and no lower extremity edema ABDOMEN:abdomen soft, non-tender and normal bowel sounds. (+) surgical incision sites has healing well. Musculoskeletal:no cyanosis of digits and no clubbing  PSYCH: alert & oriented x  3 with fluent speech NEURO: no focal motor/sensory deficits  LABORATORY DATA:  I have reviewed the data as listed CBC Latest Ref Rng & Units 11/18/2017 09/30/2017 09/23/2017  WBC 4.0 - 10.3 10e3/uL 6.5 5.6 11.3(H)  Hemoglobin 13.0 - 17.1 g/dL 14.9 14.4 14.2  Hematocrit 38.4 - 49.9 % 44.6 43.1 41.6  Platelets 140 - 400 10e3/uL 187 191 167   CMP Latest Ref Rng & Units 11/18/2017 09/30/2017 09/23/2017  Glucose 70 - 140 mg/dl 104 100 154(H)  BUN 7.0 - 26.0 mg/dL 8.3 11.8 10  Creatinine 0.7 - 1.3 mg/dL 0.9 0.8 0.91  Sodium 136 - 145 mEq/L 139 138 139  Potassium 3.5 - 5.1 mEq/L 3.6 3.8 4.1  Chloride 101 - 111 mmol/L - - 106  CO2 22 - 29 mEq/L 26 21(L) 25  Calcium 8.4 - 10.4 mg/dL 8.7 8.4 8.5(L)  Total Protein 6.4 - 8.3 g/dL 4.9(L) 5.5(L) -  Total Bilirubin 0.20 - 1.20 mg/dL 0.33 0.36 -  Alkaline Phos 40 - 150 U/L 61 62 -  AST 5 - 34 U/L 24 18 -  ALT 0 - 55 U/L 15 15 -   Pathology report   TAKEDOWN OF LOOP ILEOSTOMY by Dr. Johney Maine on 09/22/17  Diagnosis  Ileostomy, loop SKIN AND COLONIC MUCOSA NEGATIVE FOR CARCINOMA   Diagnosis 07/28/2016 1. Surgical [P], descending, polyps(2) -TUBULAR ADENOMAS. -NO HIGH GRADE DYSPLASIA OR MALIGNANCY IDENTIFIED. 2. Surgical [P], rectal mass -INVASIVE ADENOCARCINOMA. -SEE COMMENT. 3. Surgical [P], rectum, polyps(2) -TUBULAR ADENOMAS. -NO HIGH GRADE DYSPLASIA OR MALIGNANCY IDENTIFIED.    Diagnosis 12/01/2016 1. Colon, total resection (incl lymph nodes), proctocolectomy - SCATTERED SMALL RESIDUAL FOCI (EACH LESS THAN 0.3 CM) OF INVASIVE MODERATELY  DIFFERENTIATED ADENOCARCINOMA EMBEDDED IN SUBMUCOSAL TISSUE WITH EXTENSIVE NEOADJUVANT EFFECT. - TUMOR IS PRESENT WITHIN A REGION MEASURING APPROXIMATELY 2.5 CM IN GREATEST DIMENSION. - ADENOCARCINOMA INVADES THROUGH MUSCULARIS MUCOSA AND INVOLVES SUBSEROSAL SOFT TISSUE. - MARGINS ARE NEGATIVE. - TWO OF THIRTY-FIVE LYMPH NODES ARE POSITIVE FOR METASTATIC ADENOCARCINOMA (2/35). - LYMPH NODES ALSO  DEMONSTRATE VARIABLE DEGREES OF NEOADJUVANT EFFECT. - TWO CALCIFIED AND NECROTIC SOFT TISSUE NODULES ARE ALSO PRESENT WITHOUT VIABLE TUMOR SEEN. - SEE ONCOLOGY TEMPLATE. 2. Colon, resection margin (donut), distal ring final distal margin - BENIGN ANAL MUCOSA. - NO DYSPLASIA OR TUMOR SEEN.   PROCEDURES  Pouchoscopy by Dr. Marcello Moores on 09/21/17 IMPRESSION  - The examined portion of the ileum was normal. - No specimens collected.    RADIOGRAPHIC STUDIES: I have personally reviewed the radiological images as listed and agreed with the findings in the report.  CT Chest w contrast 06/21/2017 IMPRESSION: 1. No evidence of metastatic disease. 2.  Emphysema (ICD10-J43.9). 3. Scattered subpleural pulmonary nodules measure 5 mm or less in size, stable and likely subpleural lymph nodes.  EUS 07/29/2016 -Clearly malignant 6cm long, nearly circumferential mass in the distal rectum with distal edge located 1cm from the anal verge. If this is rectal adenocarcinoma it is uT3N2a, Stage IIIa.  COLONOSCOPY 07/28/2016 - Rectal mass. - Four 4 mm polyps in the rectum and in the descending colon, removed with a cold snare. Resected and retrieved. - Diverticulosis in the left colon. - Likely malignant tumor in the distal rectum. Biopsied. Tattooed.  CT of the chest/abd/pelvis with contrast 11/29/2016 IMPRESSION: 1. Interval treatment changes within the perirectal fat with otherwise stable rectosigmoid colon wall thickening. 2. No progressive adenopathy or distant metastases identified. 3. Stable bilateral pulmonary nodules. Short-term stability (for 4 months) is reassuring, although the nodules are too small to optimally characterize by previous PET-CT. Continued CT follow-up recommended.   ASSESSMENT & PLAN: 46 y.o. male without significant past medical history presented with rectal pain and bleeding.  1. 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 He also has a slightly prominent periaortic lymph node, about 8 mm. -I previously reviewed his PET CT scan findings with patient and his girlfriend, the small lung nodules and periaortic lymph node are not hypermetabolic, no definitive evidence of metastasis on the PET scan. -He previously completed neoadjuvant chemoradiation, tolerated moderate well overall.  -I previously reviewed his surgical pathology findings in great details. He had computed surgical resection, but unfortunately still has significant residual disease including 2 positive lymph nodes. -He completed adjuvant FOLFOX -I reviewed his surveillance CT from 06/20/2017, which showed small and stable pulmonary nodules, no other new lesions, no evidence of metastases.   -His underwent ileostomy reversal by Dr. Johney Maine on 09/22/17, has no residual pain and is healing well.  -He still has significant diarrhea, he is on maximal dose of Imodium and Lomotil, still not controlled -He is otherwise clinically doing well, lab reviewed, his CBC and CMP are within normal limits. We'll continue surveillance.  -Given his lynch syndrome I again discussed cancer screenings.  -Due to his indeterminate lung nodules, I will repeat scan in 1 month  -f/u in 3 moths.  -I offered  flu shot today, he opted in for vaccination today    2. 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  -diarrhea has much improved with Lomotil, he will continue -I have previously set  him up for IV fluids 2-3 times a week, off now  - Diarrhea increased since colostomy reversal, He will increase lomotil to 8 a day.   3. 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 -He has undergone total colectomy and proctocolectomy, his risk of secondary colon cancer is minimal -I again discussed other cancer risks from age syndrome, I previously recommend him to have EGD every 3-5 years, annual urinalysis for GU cancer screening.   4. Anxiety, Depression -He previously felt nervous and anxious since his cancer diagnosis, much improved after starting treatment  -Continue Xanax as needed, refilled today (11/18/17)  5. Social issues -He is out of work, he wants to apply for Medicaid and disability  -He was seen by our Education officer, museum  6.  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 Loletha Carrow about baseline screening EGD     All questions were answered. The patient knows to call the clinic with any problems, questions or concerns. I spent 20 minutes counseling the patient face to face. The total time spent in the appointment was 25 minutes and more than 50% was on counseling.  I have reviewed the above documentation for accuracy and completeness, and I agree with the above information.   This document serves as a record of services personally performed by Truitt Merle, MD. It was created on her behalf by Joslyn Devon, a trained medical scribe. The creation of this record is based on the scribe's personal observations and the provider's statements to them.    I have reviewed the above documentation for accuracy and completeness, and I agree with  the above.   Truitt Merle, MD 11/18/2017   Addendum  I spoke with GI Dr. Loletha Carrow who recommend octreotide injection for his diarrhea secondary to total colectomy.  I will try to get his insurance approval.  I called patient, he agrees to try.  We will schedule his injection appointment after insurance approval.  Truitt Merle  11/23/2017

## 2017-11-18 ENCOUNTER — Ambulatory Visit: Payer: Medicaid Other

## 2017-11-18 ENCOUNTER — Ambulatory Visit (HOSPITAL_BASED_OUTPATIENT_CLINIC_OR_DEPARTMENT_OTHER): Payer: Medicaid Other | Admitting: Hematology

## 2017-11-18 ENCOUNTER — Other Ambulatory Visit (HOSPITAL_BASED_OUTPATIENT_CLINIC_OR_DEPARTMENT_OTHER): Payer: Medicaid Other

## 2017-11-18 ENCOUNTER — Telehealth: Payer: Self-pay | Admitting: Hematology

## 2017-11-18 VITALS — BP 99/67 | HR 67 | Temp 98.3°F | Resp 17 | Wt 156.9 lb

## 2017-11-18 DIAGNOSIS — R918 Other nonspecific abnormal finding of lung field: Secondary | ICD-10-CM | POA: Diagnosis not present

## 2017-11-18 DIAGNOSIS — Z23 Encounter for immunization: Secondary | ICD-10-CM | POA: Diagnosis not present

## 2017-11-18 DIAGNOSIS — G629 Polyneuropathy, unspecified: Secondary | ICD-10-CM | POA: Diagnosis not present

## 2017-11-18 DIAGNOSIS — F418 Other specified anxiety disorders: Secondary | ICD-10-CM | POA: Diagnosis not present

## 2017-11-18 DIAGNOSIS — R197 Diarrhea, unspecified: Secondary | ICD-10-CM

## 2017-11-18 DIAGNOSIS — Z95828 Presence of other vascular implants and grafts: Secondary | ICD-10-CM

## 2017-11-18 DIAGNOSIS — Z1509 Genetic susceptibility to other malignant neoplasm: Secondary | ICD-10-CM

## 2017-11-18 DIAGNOSIS — C20 Malignant neoplasm of rectum: Secondary | ICD-10-CM

## 2017-11-18 LAB — CBC WITH DIFFERENTIAL/PLATELET
BASO%: 0.8 % (ref 0.0–2.0)
BASOS ABS: 0.1 10*3/uL (ref 0.0–0.1)
EOS ABS: 0.2 10*3/uL (ref 0.0–0.5)
EOS%: 2.3 % (ref 0.0–7.0)
HEMATOCRIT: 44.6 % (ref 38.4–49.9)
HGB: 14.9 g/dL (ref 13.0–17.1)
LYMPH#: 0.9 10*3/uL (ref 0.9–3.3)
LYMPH%: 14.2 % (ref 14.0–49.0)
MCH: 29.3 pg (ref 27.2–33.4)
MCHC: 33.4 g/dL (ref 32.0–36.0)
MCV: 87.8 fL (ref 79.3–98.0)
MONO#: 0.5 10*3/uL (ref 0.1–0.9)
MONO%: 8 % (ref 0.0–14.0)
NEUT#: 4.9 10*3/uL (ref 1.5–6.5)
NEUT%: 74.7 % (ref 39.0–75.0)
PLATELETS: 187 10*3/uL (ref 140–400)
RBC: 5.08 10*6/uL (ref 4.20–5.82)
RDW: 14.3 % (ref 11.0–14.6)
WBC: 6.5 10*3/uL (ref 4.0–10.3)

## 2017-11-18 LAB — COMPREHENSIVE METABOLIC PANEL
ALBUMIN: 2.5 g/dL — AB (ref 3.5–5.0)
ALT: 15 U/L (ref 0–55)
AST: 24 U/L (ref 5–34)
Alkaline Phosphatase: 61 U/L (ref 40–150)
Anion Gap: 6 mEq/L (ref 3–11)
BUN: 8.3 mg/dL (ref 7.0–26.0)
CHLORIDE: 108 meq/L (ref 98–109)
CO2: 26 meq/L (ref 22–29)
Calcium: 8.7 mg/dL (ref 8.4–10.4)
Creatinine: 0.9 mg/dL (ref 0.7–1.3)
GLUCOSE: 104 mg/dL (ref 70–140)
POTASSIUM: 3.6 meq/L (ref 3.5–5.1)
SODIUM: 139 meq/L (ref 136–145)
Total Bilirubin: 0.33 mg/dL (ref 0.20–1.20)
Total Protein: 4.9 g/dL — ABNORMAL LOW (ref 6.4–8.3)

## 2017-11-18 LAB — CEA (IN HOUSE-CHCC): CEA (CHCC-In House): 1.8 ng/mL (ref 0.00–5.00)

## 2017-11-18 LAB — TECHNOLOGIST REVIEW: Technologist Review: 1

## 2017-11-18 MED ORDER — SODIUM CHLORIDE 0.9% FLUSH
10.0000 mL | INTRAVENOUS | Status: AC | PRN
  Administered 2017-11-18: 10 mL via INTRAVENOUS
  Filled 2017-11-18: qty 10

## 2017-11-18 MED ORDER — ALPRAZOLAM 0.5 MG PO TABS: 0.5000 mg | ORAL_TABLET | Freq: Two times a day (BID) | ORAL | 1 refills | Status: DC | PRN

## 2017-11-18 MED ORDER — INFLUENZA VAC SPLIT QUAD 0.5 ML IM SUSY
0.5000 mL | PREFILLED_SYRINGE | Freq: Once | INTRAMUSCULAR | Status: AC
  Administered 2017-11-18: 0.5 mL via INTRAMUSCULAR
  Filled 2017-11-18: qty 0.5

## 2017-11-18 MED ORDER — HEPARIN SOD (PORK) LOCK FLUSH 100 UNIT/ML IV SOLN
500.0000 [IU] | Freq: Once | INTRAVENOUS | Status: AC
  Administered 2017-11-18: 500 [IU] via INTRAVENOUS
  Filled 2017-11-18: qty 5

## 2017-11-18 NOTE — Patient Instructions (Signed)
Implanted Port Home Guide An implanted port is a type of central line that is placed under the skin. Central lines are used to provide IV access when treatment or nutrition needs to be given through a person's veins. Implanted ports are used for long-term IV access. An implanted port may be placed because:  You need IV medicine that would be irritating to the small veins in your hands or arms.  You need long-term IV medicines, such as antibiotics.  You need IV nutrition for a long period.  You need frequent blood draws for lab tests.  You need dialysis.  Implanted ports are usually placed in the chest area, but they can also be placed in the upper arm, the abdomen, or the leg. An implanted port has two main parts:  Reservoir. The reservoir is round and will appear as a small, raised area under your skin. The reservoir is the part where a needle is inserted to give medicines or draw blood.  Catheter. The catheter is a thin, flexible tube that extends from the reservoir. The catheter is placed into a large vein. Medicine that is inserted into the reservoir goes into the catheter and then into the vein.  How will I care for my incision site? Do not get the incision site wet. Bathe or shower as directed by your health care provider. How is my port accessed? Special steps must be taken to access the port:  Before the port is accessed, a numbing cream can be placed on the skin. This helps numb the skin over the port site.  Your health care provider uses a sterile technique to access the port. ? Your health care provider must put on a mask and sterile gloves. ? The skin over your port is cleaned carefully with an antiseptic and allowed to dry. ? The port is gently pinched between sterile gloves, and a needle is inserted into the port.  Only "non-coring" port needles should be used to access the port. Once the port is accessed, a blood return should be checked. This helps ensure that the port  is in the vein and is not clogged.  If your port needs to remain accessed for a constant infusion, a clear (transparent) bandage will be placed over the needle site. The bandage and needle will need to be changed every week, or as directed by your health care provider.  Keep the bandage covering the needle clean and dry. Do not get it wet. Follow your health care provider's instructions on how to take a shower or bath while the port is accessed.  If your port does not need to stay accessed, no bandage is needed over the port.  What is flushing? Flushing helps keep the port from getting clogged. Follow your health care provider's instructions on how and when to flush the port. Ports are usually flushed with saline solution or a medicine called heparin. The need for flushing will depend on how the port is used.  If the port is used for intermittent medicines or blood draws, the port will need to be flushed: ? After medicines have been given. ? After blood has been drawn. ? As part of routine maintenance.  If a constant infusion is running, the port may not need to be flushed.  How long will my port stay implanted? The port can stay in for as long as your health care provider thinks it is needed. When it is time for the port to come out, surgery will be   done to remove it. The procedure is similar to the one performed when the port was put in. When should I seek immediate medical care? When you have an implanted port, you should seek immediate medical care if:  You notice a bad smell coming from the incision site.  You have swelling, redness, or drainage at the incision site.  You have more swelling or pain at the port site or the surrounding area.  You have a fever that is not controlled with medicine.  This information is not intended to replace advice given to you by your health care provider. Make sure you discuss any questions you have with your health care provider. Document  Released: 12/06/2005 Document Revised: 05/13/2016 Document Reviewed: 08/13/2013 Elsevier Interactive Patient Education  2017 Elsevier Inc.  

## 2017-11-18 NOTE — Telephone Encounter (Signed)
Gave avs and calendar for February 2019 °

## 2017-11-20 ENCOUNTER — Encounter: Payer: Self-pay | Admitting: Hematology

## 2017-11-22 ENCOUNTER — Telehealth: Payer: Self-pay | Admitting: Gastroenterology

## 2017-11-22 NOTE — Telephone Encounter (Signed)
Spoke to [place], scheduled EGD at first available on 12/23/17, also pre-visit for 11/25/18.

## 2017-11-22 NOTE — Telephone Encounter (Signed)
I received a follow up clinic note from Dr. Burr Medico of Oncology regarding this patient. He needs to be scheduled for an upper endoscopy for Lynch syndrome screening.  Please make the arrangements.

## 2017-11-23 ENCOUNTER — Other Ambulatory Visit: Payer: Self-pay | Admitting: Hematology

## 2017-11-24 ENCOUNTER — Telehealth: Payer: Self-pay | Admitting: Hematology

## 2017-11-24 NOTE — Telephone Encounter (Signed)
Spoke with patients wife and confirmed appt that was added per 12/6 sch msg.

## 2017-11-25 ENCOUNTER — Ambulatory Visit (HOSPITAL_BASED_OUTPATIENT_CLINIC_OR_DEPARTMENT_OTHER): Payer: Medicaid Other

## 2017-11-25 VITALS — BP 118/75 | HR 72 | Temp 98.4°F | Resp 18

## 2017-11-25 DIAGNOSIS — C20 Malignant neoplasm of rectum: Secondary | ICD-10-CM | POA: Diagnosis not present

## 2017-11-25 DIAGNOSIS — R197 Diarrhea, unspecified: Secondary | ICD-10-CM | POA: Diagnosis present

## 2017-11-25 MED ORDER — OCTREOTIDE ACETATE 20 MG IM KIT
20.0000 mg | PACK | Freq: Once | INTRAMUSCULAR | Status: AC
  Administered 2017-11-25: 20 mg via INTRAMUSCULAR

## 2017-12-06 ENCOUNTER — Encounter (HOSPITAL_COMMUNITY): Payer: Self-pay

## 2017-12-06 ENCOUNTER — Ambulatory Visit (HOSPITAL_COMMUNITY)
Admission: RE | Admit: 2017-12-06 | Discharge: 2017-12-06 | Disposition: A | Discharge status: Home / Self Care | Payer: Medicaid Other | Source: Ambulatory Visit | Attending: Hematology | Admitting: Hematology

## 2017-12-06 DIAGNOSIS — C20 Malignant neoplasm of rectum: Secondary | ICD-10-CM | POA: Insufficient documentation

## 2017-12-06 DIAGNOSIS — J439 Emphysema, unspecified: Secondary | ICD-10-CM | POA: Insufficient documentation

## 2017-12-06 MED ORDER — IOPAMIDOL (ISOVUE-300) INJECTION 61%
INTRAVENOUS | Status: AC
  Administered 2017-12-06: 100 mL via INTRAVENOUS
  Filled 2017-12-06: qty 100

## 2017-12-06 MED ORDER — IOPAMIDOL (ISOVUE-300) INJECTION 61%
100.0000 mL | Freq: Once | INTRAVENOUS | Status: AC | PRN
  Administered 2017-12-06: 100 mL via INTRAVENOUS

## 2017-12-07 ENCOUNTER — Telehealth: Payer: Self-pay | Admitting: *Deleted

## 2017-12-07 NOTE — Telephone Encounter (Signed)
Wife [name] called wanting to know results of pt's CT scans done yesterday 12/06/17.  Dr. Burr Medico reviewed results.  Spoke with [name], and informed her re: CT scans results normal as per Dr. Ernestina Penna instructions.  [name] voiced understanding, and stated she would relay message to pt. [name]'s    Phone     [phone].

## 2017-12-23 ENCOUNTER — Ambulatory Visit: Payer: Medicaid Other

## 2017-12-23 ENCOUNTER — Other Ambulatory Visit: Payer: Self-pay | Admitting: Hematology

## 2017-12-23 ENCOUNTER — Encounter: Payer: Medicaid Other | Admitting: Gastroenterology

## 2017-12-23 DIAGNOSIS — C20 Malignant neoplasm of rectum: Secondary | ICD-10-CM

## 2017-12-23 DIAGNOSIS — Z1509 Genetic susceptibility to other malignant neoplasm: Secondary | ICD-10-CM

## 2017-12-27 ENCOUNTER — Other Ambulatory Visit: Payer: Self-pay | Admitting: *Deleted

## 2017-12-27 MED ORDER — GABAPENTIN 300 MG PO CAPS: 300.0000 mg | ORAL_CAPSULE | Freq: Three times a day (TID) | ORAL | 1 refills | Status: DC

## 2017-12-28 ENCOUNTER — Other Ambulatory Visit: Payer: Self-pay | Admitting: Hematology

## 2017-12-28 DIAGNOSIS — C20 Malignant neoplasm of rectum: Secondary | ICD-10-CM

## 2017-12-28 DIAGNOSIS — Z1509 Genetic susceptibility to other malignant neoplasm: Secondary | ICD-10-CM

## 2018-01-03 ENCOUNTER — Ambulatory Visit: Payer: Self-pay | Admitting: Surgery

## 2018-01-03 MED FILL — PAREGORIC LIQUID: 2 | 12 days supply | Qty: 473 | Fill #0

## 2018-01-04 ENCOUNTER — Other Ambulatory Visit: Payer: Self-pay

## 2018-01-04 ENCOUNTER — Encounter: Payer: Medicaid Other | Admitting: Gastroenterology

## 2018-01-04 ENCOUNTER — Encounter (HOSPITAL_COMMUNITY): Payer: Self-pay | Admitting: Emergency Medicine

## 2018-01-09 ENCOUNTER — Ambulatory Visit: Payer: Self-pay | Admitting: General Surgery

## 2018-01-12 ENCOUNTER — Ambulatory Visit (HOSPITAL_COMMUNITY): Payer: Medicaid Other | Admitting: Certified Registered Nurse Anesthetist

## 2018-01-12 ENCOUNTER — Encounter (HOSPITAL_COMMUNITY): Payer: Self-pay | Admitting: Emergency Medicine

## 2018-01-12 ENCOUNTER — Other Ambulatory Visit: Payer: Self-pay

## 2018-01-12 ENCOUNTER — Ambulatory Visit (HOSPITAL_COMMUNITY)
Admission: RE | Admit: 2018-01-12 | Discharge: 2018-01-12 | Disposition: A | Discharge status: Home / Self Care | Payer: Medicaid Other | Source: Ambulatory Visit | Attending: General Surgery | Admitting: General Surgery

## 2018-01-12 ENCOUNTER — Ambulatory Visit (HOSPITAL_COMMUNITY): Payer: Medicaid Other

## 2018-01-12 ENCOUNTER — Encounter (HOSPITAL_COMMUNITY)
Admission: RE | Disposition: A | Discharge status: Home / Self Care | Payer: Self-pay | Source: Ambulatory Visit | Attending: General Surgery

## 2018-01-12 DIAGNOSIS — K529 Noninfective gastroenteritis and colitis, unspecified: Secondary | ICD-10-CM | POA: Insufficient documentation

## 2018-01-12 DIAGNOSIS — Z980 Intestinal bypass and anastomosis status: Secondary | ICD-10-CM | POA: Insufficient documentation

## 2018-01-12 DIAGNOSIS — K5939 Other megacolon: Secondary | ICD-10-CM

## 2018-01-12 DIAGNOSIS — F419 Anxiety disorder, unspecified: Secondary | ICD-10-CM | POA: Insufficient documentation

## 2018-01-12 DIAGNOSIS — Z85038 Personal history of other malignant neoplasm of large intestine: Secondary | ICD-10-CM | POA: Insufficient documentation

## 2018-01-12 DIAGNOSIS — Y92234 Operating room of hospital as the place of occurrence of the external cause: Secondary | ICD-10-CM | POA: Insufficient documentation

## 2018-01-12 DIAGNOSIS — K6389 Other specified diseases of intestine: Secondary | ICD-10-CM | POA: Insufficient documentation

## 2018-01-12 DIAGNOSIS — K91850 Pouchitis: Secondary | ICD-10-CM | POA: Diagnosis not present

## 2018-01-12 DIAGNOSIS — K624 Stenosis of anus and rectum: Secondary | ICD-10-CM

## 2018-01-12 DIAGNOSIS — R197 Diarrhea, unspecified: Secondary | ICD-10-CM | POA: Diagnosis present

## 2018-01-12 DIAGNOSIS — Y832 Surgical operation with anastomosis, bypass or graft as the cause of abnormal reaction of the patient, or of later complication, without mention of misadventure at the time of the procedure: Secondary | ICD-10-CM | POA: Diagnosis not present

## 2018-01-12 DIAGNOSIS — Z79899 Other long term (current) drug therapy: Secondary | ICD-10-CM | POA: Insufficient documentation

## 2018-01-12 DIAGNOSIS — F17210 Nicotine dependence, cigarettes, uncomplicated: Secondary | ICD-10-CM | POA: Diagnosis not present

## 2018-01-12 HISTORY — PX: POUCHOSCOPY: SHX6321

## 2018-01-12 HISTORY — DX: Stenosis of anus and rectum: K62.4

## 2018-01-12 SURGERY — ENDOSCOPY, POUCH, SMALL INTESTINE, DIAGNOSTIC
Anesthesia: Monitor Anesthesia Care

## 2018-01-12 MED ORDER — ACETAMINOPHEN 650 MG RE SUPP
650.0000 mg | RECTAL | Status: DC | PRN
  Filled 2018-01-12: qty 1

## 2018-01-12 MED ORDER — PROPOFOL 10 MG/ML IV BOLUS
INTRAVENOUS | Status: AC
  Filled 2018-01-12: qty 20

## 2018-01-12 MED ORDER — ACETAMINOPHEN 325 MG PO TABS
650.0000 mg | ORAL_TABLET | ORAL | Status: DC | PRN
  Filled 2018-01-12: qty 2

## 2018-01-12 MED ORDER — SODIUM CHLORIDE 0.9% FLUSH: 3.0000 mL | Freq: Two times a day (BID) | INTRAVENOUS | Status: DC

## 2018-01-12 MED ORDER — SODIUM CHLORIDE 0.9% FLUSH: 3.0000 mL | INTRAVENOUS | Status: DC | PRN

## 2018-01-12 MED ORDER — LACTATED RINGERS IV SOLN
INTRAVENOUS | Status: DC
  Administered 2018-01-12: 09:00:00 via INTRAVENOUS

## 2018-01-12 MED ORDER — FENTANYL CITRATE (PF) 100 MCG/2ML IJ SOLN
INTRAMUSCULAR | Status: DC | PRN
  Administered 2018-01-12 (×3): 50 ug via INTRAVENOUS

## 2018-01-12 MED ORDER — FENTANYL CITRATE (PF) 100 MCG/2ML IJ SOLN
INTRAMUSCULAR | Status: AC
  Filled 2018-01-12: qty 2

## 2018-01-12 MED ORDER — MIDAZOLAM HCL 2 MG/2ML IJ SOLN
INTRAMUSCULAR | Status: DC | PRN
  Administered 2018-01-12: 2 mg via INTRAVENOUS

## 2018-01-12 MED ORDER — SODIUM CHLORIDE 0.9 % IV SOLN: 250.0000 mL | INTRAVENOUS | Status: DC | PRN

## 2018-01-12 MED ORDER — PROPOFOL 500 MG/50ML IV EMUL
INTRAVENOUS | Status: DC | PRN
  Administered 2018-01-12: 100 ug/kg/min via INTRAVENOUS

## 2018-01-12 MED ORDER — MIDAZOLAM HCL 2 MG/2ML IJ SOLN
INTRAMUSCULAR | Status: AC
  Filled 2018-01-12: qty 2

## 2018-01-12 MED ORDER — SODIUM CHLORIDE 0.9 % IV SOLN: INTRAVENOUS | Status: DC

## 2018-01-12 MED ORDER — PROPOFOL 10 MG/ML IV BOLUS
INTRAVENOUS | Status: DC | PRN
  Administered 2018-01-12 (×3): 20 mg via INTRAVENOUS

## 2018-01-12 NOTE — Transfer of Care (Signed)
Immediate Anesthesia Transfer of Care Note  Patient: [name]  Procedure(s) Performed: POUCHOSCOPY WITH BIOPSIES (N/A )  Patient Location: PACU and Endoscopy Unit  Anesthesia Type:MAC  Level of Consciousness: patient cooperative  Airway & Oxygen Therapy: Patient Spontanous Breathing and Patient connected to nasal cannula oxygen  Post-op Assessment: Report given to RN and Post -op Vital signs reviewed and stable  Post vital signs: Reviewed  Last Vitals:  Vitals:   01/12/18 0901  BP: 114/71  Pulse: 96  Resp: 18  Temp: 36.6 C  SpO2: 98%    Last Pain:  Vitals:   01/12/18 0901  TempSrc: Oral         Complications: No apparent anesthesia complications

## 2018-01-12 NOTE — H&P (Signed)
HPI: 47 year old male status post ileostomy reversal of J-pouch here for evaluation of chronic watery stools despite maximal medical management.    Past Medical History:  Diagnosis Date  . Anxiety   . Cancer ([place]) 12/01/2016   colon   . Family history of colon cancer   . Poor dental hygiene    Past Surgical History:  Procedure Laterality Date  . EUS N/A 07/29/2016   Procedure: LOWER ENDOSCOPIC ULTRASOUND (EUS);  Surgeon: Milus Banister, MD;  Location: [facility];  Service: Endoscopy;  Laterality: N/A;  . ILEOSTOMY CLOSURE N/A 09/22/2017   Procedure: TAKEDOWN OF LOOP ILEOSTOMY;  Surgeon: Michael Boston, MD;  Location: [facility];  Service: General;  Laterality: N/A;  . PORTACATH PLACEMENT N/A 01/26/2017   Procedure: PLACEMENT OF PORT-A-CATH CENTRAL LINE WITH FLUOROSCOPY AND ULTRASOUND;  Surgeon: Michael Boston, MD;  Location: [facility];  Service: General;  Laterality: N/A;  . POUCHOSCOPY N/A 09/21/2017   Procedure: POUCHOSCOPY;  Surgeon: Leighton Ruff, MD;  Location: [facility];  Service: Endoscopy;  Laterality: N/A;  . PROCTOSCOPY N/A 12/01/2016   Procedure: RIGID PROCTOSCOPY;  Surgeon: Michael Boston, MD;  Location: [facility];  Service: General;  Laterality: N/A;  . TOE AMPUTATION Left   . XI ROBOTIC ASSISTED LOWER ANTERIOR RESECTION N/A 12/01/2016   Procedure: XI ROBOTIC ASSISTED PROCTOCOLECTOMY WITH ILLEOPOUCH ANASTAMOSIS WITH DIVERTING ILLEOSTOMY;  Surgeon: Michael Boston, MD;  Location: [facility];  Service: General;  Laterality: N/A;   Family History  Problem Relation Age of Onset  . Diabetes Mother   . COPD Father   . Colon cancer Maternal Aunt        dx in her 36s  . Diabetes Maternal Grandmother   . Brain cancer Maternal Grandfather   . Lung cancer Paternal Grandfather   . Colon cancer Cousin        dx in her 41s  . Bone cancer Sister 8  . Pancreatic cancer Neg Hx   . Esophageal cancer Neg Hx   . Stomach cancer Neg Hx   . Liver disease Neg Hx    Social History    Socioeconomic History  . Marital status: Married    Spouse name: Not on file  . Number of children: 3  . Years of education: Not on file  . Highest education level: Not on file  Social Needs  . Financial resource strain: Not on file  . Food insecurity - worry: Not on file  . Food insecurity - inability: Not on file  . Transportation needs - medical: Not on file  . Transportation needs - non-medical: Not on file  Occupational History  . Not on file  Tobacco Use  . Smoking status: Current Every Day Smoker    Packs/day: 1.50    Years: 30.00    Pack years: 45.00    Types: Cigarettes    Last attempt to quit: 12/01/2016    Years since quitting: 1.1  . Smokeless tobacco: Never Used  . Tobacco comment: reduced smoking to 2-3 daily  Substance and Sexual Activity  . Alcohol use: No  . Drug use: No    Comment: patient denies  . Sexual activity: Not on file  Other Topics Concern  . Not on file  Social History Narrative   Lives with significant other, [name]   Have a 43 year old son   Smoker      [place]   Review of Systems - General ROS: negative for - chills or fever  Respiratory ROS: no cough, shortness of breath, or wheezing Cardiovascular ROS: no chest pain or dyspnea on exertion Gastrointestinal ROS: positive for - diarrhea negative for - abdominal pain or blood in stools Genito-Urinary ROS: no dysuria, trouble voiding, or hematuria   No Known Allergies  Current Facility-Administered Medications for the 01/12/18 encounter [facility] Encounter)  Medication  . 0.9 %  sodium chloride infusion   Current Meds  Medication Sig  . ALPRAZolam (XANAX) 0.5 MG tablet Take 1 tablet (0.5 mg total) by mouth 2 (two) times daily as needed for anxiety or sleep.  . ciprofloxacin (CIPRO) 500 MG tablet Take 500 mg by mouth 2 (two) times daily.  Marland Kitchen gabapentin (NEURONTIN) 300 MG capsule Take 1 capsule (300 mg total) by mouth 3 (three) times daily. (Patient taking  differently: Take 300-900 mg by mouth 3 (three) times daily. )  . ibuprofen (ADVIL,MOTRIN) 200 MG tablet Take 400 mg by mouth every 6 (six) hours as needed for headache or moderate pain.  . metroNIDAZOLE (FLAGYL) 500 MG tablet Take 500 mg by mouth 2 (two) times daily.  . paregoric 2 MG/5ML solution Take 5-10 mLs by mouth 4 (four) times daily as needed for diarrhea or loose stools.  BP 114/71   Pulse 96   Temp 97.9 F (36.6 C) (Oral)   Resp 18   Ht 5\' 11"  (1.803 m)   Wt 70.3 kg (155 lb)   SpO2 98%   BMI 21.62 kg/m    Physical Exam  Constitutional: He is oriented to person, place, and time. He appears well-developed and well-nourished.  HENT:  Head: Normocephalic and atraumatic.  Eyes: Conjunctivae and EOM are normal. Pupils are equal, round, and reactive to light.  Neck: Normal range of motion. Neck supple.  Cardiovascular: Normal rate and regular rhythm.  Pulmonary/Chest: Effort normal.  Abdominal: Soft. Bowel sounds are normal.  Musculoskeletal: Normal range of motion.  Neurological: He is alert and oriented to person, place, and time.   Assessment: chronic diarrhea with j pouch  Plan: pouchoscopy and biopsy.  Risks include bleeding, pain and recurrence.

## 2018-01-12 NOTE — Discharge Instructions (Signed)
Get Hegar dilators to dilate anus every day:  8 Hegar Dilator Sounds Set 7.5" Double Ended Gynecology Surgical Instrument  Average rating:5out of5stars, based [email or web address] reviews  surgicalonline  $18.15  Free shipping  STOP SMOKING  STOP SMOKING!  We strongly recommend that you stop smoking.  Smoking increases the risk of surgery including infection in the form of an open wound, pus formation, abscess, hernia at an incision on the abdomen, etc.  You have an increased risk of other MAJOR complications such as stroke, heart attack, forming clots in the leg and/or lungs, and death.    Smoking Cessation Quitting smoking is important to your health and has many advantages. However, it is not always easy to quit since nicotine is a very addictive drug. Often times, people try 3 times or more before being able to quit. This document explains the best ways for you to prepare to quit smoking. Quitting takes hard work and a lot of effort, but you can do it. ADVANTAGES OF QUITTING SMOKING  You will live longer, feel better, and live better.  Your body will feel the impact of quitting smoking almost immediately.  Within 20 minutes, blood pressure decreases. Your pulse returns to its normal level.  After 8 hours, carbon monoxide levels in the blood return to normal. Your oxygen level increases.  After 24 hours, the chance of having a heart attack starts to decrease. Your breath, hair, and body stop smelling like smoke.  After 48 hours, damaged nerve endings begin to recover. Your sense of taste and smell improve.  After 72 hours, the body is virtually free of nicotine. Your bronchial tubes relax and breathing becomes easier.  After 2 to 12 weeks, lungs can hold more air. Exercise becomes easier and circulation improves.  The risk of having a heart attack, stroke, cancer, or lung disease is greatly reduced.  After 1 year, the risk of coronary heart disease is cut in half.  After 5 years, the  risk of stroke falls to the same as a nonsmoker.  After 10 years, the risk of lung cancer is cut in half and the risk of other cancers decreases significantly.  After 15 years, the risk of coronary heart disease drops, usually to the level of a nonsmoker.  If you are pregnant, quitting smoking will improve your chances of having a healthy baby.  The people you live with, especially any children, will be healthier.  You will have extra money to spend on things other than cigarettes. QUESTIONS TO THINK ABOUT BEFORE ATTEMPTING TO QUIT You may want to talk about your answers with your caregiver.  Why do you want to quit?  If you tried to quit in the past, what helped and what did not?  What will be the most difficult situations for you after you quit? How will you plan to handle them?  Who can help you through the tough times? Your family? Friends? A caregiver?  What pleasures do you get from smoking? What ways can you still get pleasure if you quit? Here are some questions to ask your caregiver:  How can you help me to be successful at quitting?  What medicine do you think would be best for me and how should I take it?  What should I do if I need more help?  What is smoking withdrawal like? How can I get information on withdrawal? GET READY  Set a quit date.  Change your environment by getting rid of all cigarettes, ashtrays, matches,  and lighters in your home, car, or work. Do not let people smoke in your home.  Review your past attempts to quit. Think about what worked and what did not. GET SUPPORT AND ENCOURAGEMENT You have a better chance of being successful if you have help. You can get support in many ways.  Tell your family, friends, and co-workers that you are going to quit and need their support. Ask them not to smoke around you.  Get individual, group, or telephone counseling and support. Programs are available at [employer] and health centers. Call your local  health department for information about programs in your area.  Spiritual beliefs and practices may help some smokers quit.  Download a "quit meter" on your computer to keep track of quit statistics, such as how long you have gone without smoking, cigarettes not smoked, and money saved.  Get a self-help book about quitting smoking and staying off of tobacco. [place] yourself from urges to smoke. Talk to someone, go for a walk, or occupy your time with a task.  Change your normal routine. Take a different route to work. Drink tea instead of coffee. Eat breakfast in a different place.  Reduce your stress. Take a hot bath, exercise, or read a book.  Plan something enjoyable to do every day. Reward yourself for not smoking.  Explore interactive web-based programs that specialize in helping you quit. GET MEDICINE AND USE IT CORRECTLY Medicines can help you stop smoking and decrease the urge to smoke. Combining medicine with the above behavioral methods and support can greatly increase your chances of successfully quitting smoking.  Nicotine replacement therapy helps deliver nicotine to your body without the negative effects and risks of smoking. Nicotine replacement therapy includes nicotine gum, lozenges, inhalers, nasal sprays, and skin patches. Some may be available over-the-counter and others require a prescription.  Antidepressant medicine helps people abstain from smoking, but how this works is unknown. This medicine is available by prescription.  Nicotinic receptor partial agonist medicine simulates the effect of nicotine in your brain. This medicine is available by prescription. Ask your caregiver for advice about which medicines to use and how to use them based on your health history. Your caregiver will tell you what side effects to look out for if you choose to be on a medicine or therapy. Carefully read the information on the package. Do not use any  other product containing nicotine while using a nicotine replacement product.  RELAPSE OR DIFFICULT SITUATIONS Most relapses occur within the first 3 months after quitting. Do not be discouraged if you start smoking again. Remember, most people try several times before finally quitting. You may have symptoms of withdrawal because your body is used to nicotine. You may crave cigarettes, be irritable, feel very hungry, cough often, get headaches, or have difficulty concentrating. The withdrawal symptoms are only temporary. They are strongest when you first quit, but they will go away within 10 14 days. To reduce the chances of relapse, try to:  Avoid drinking alcohol. Drinking lowers your chances of successfully quitting.  Reduce the amount of caffeine you consume. Once you quit smoking, the amount of caffeine in your body increases and can give you symptoms, such as a rapid heartbeat, sweating, and anxiety.  Avoid smokers because they can make you want to smoke.  Do not let weight gain distract you. Many smokers will gain weight when they quit, usually less than 10 pounds. Eat a healthy diet  and stay active. You can always lose the weight gained after you quit.  Find ways to improve your mood other than smoking. FOR MORE INFORMATION  www.smokefree.gov    While it can be one of the most difficult things to do, the [place] community has programs to help you stop.  Consider talking with your primary care physician about options.  Also, Smoking Cessation classes are available through the [facility]:  The smoking cessation program is a proven-effective program from the American Lung Association. The program is available for anyone 62 and older who currently smokes. The program lasts for 7 weeks and is 8 sessions. Each class will be approximately 1 1/2 hours. The program is every Tuesday.  All classes are 12-1:30pm and same location.  Event Location Information:  Location: [place]  2nd Floor Conference Room 2-037; located next to [facility] cross streets: [place] Entrance into the [facility] is adjacent to the [employer] main entrance. The conference room is located on the 2nd floor.  Parking Instructions: Visitor parking is adjacent to [employer] main entrance and the [place]    A smoking cessation program is also offered through the [facility]. Register online at ClickDebate.gl or call [phone] for more information.   Tobacco cessation counseling is available at [facility]. Call [phone] for a free appointment.   Tobacco cessation classes also are available through the [facility] in [place]. For information, call [phone].   The Patient Education Network features videos on tobacco cessation. Please consult your listings in the center of this book to find instructions on how to access this resource.   If you want more information, ask your nurse.    GETTING TO GOOD BOWEL HEALTH.  ######################################################################  EAT Gradually transition to a high fiber diet with a fiber supplement over the next few weeks after discharge.  Start with a pureed / full liquid diet (see below)  WALK Walk an hour a day.  Control your pain to do that.    HAVE A BOWEL MOVEMENT DAILY Keep your bowels regular to avoid problems.  OK to try a laxative to override constipation.  OK to use an antidairrheal to slow down diarrhea.  Call if not better after 2 tries  CALL IF YOU HAVE PROBLEMS/CONCERNS Call if you are still struggling despite following these instructions. Call if you have concerns not answered by these instructions  ######################################################################   Irregular bowel habits such as constipation and diarrhea can lead to many problems over  time.  Having one soft bowel movement a day is the most important way to prevent further problems.  The anorectal canal is designed to handle stretching and feces to safely manage our ability to get rid of solid waste (feces, poop, stool) out of our body.  BUT, hard constipated stools can act like ripping concrete bricks and diarrhea can be a burning fire to this very sensitive area of our body, causing inflamed hemorrhoids, anal fissures, increasing risk is perirectal abscesses, abdominal pain/bloating, an making irritable bowel worse.      The goal: ONE SOFT BOWEL MOVEMENT A DAY!  To have soft, regular bowel movements:   Drink plenty of fluids, consider 4-6 tall glasses of water a day.    Take plenty of fiber.  Fiber is the undigested part of plant food that passes into the colon, acting s natures broom  to encourage bowel motility and movement.  Fiber can absorb and hold large amounts of water. This results in a larger, bulkier stool, which is soft and easier to pass. Work gradually over several weeks up to 6 servings a day of fiber (25g a day even more if needed) in the form of: o Vegetables -- Root (potatoes, carrots, turnips), leafy green (lettuce, salad greens, celery, spinach), or cooked high residue (cabbage, broccoli, etc) o Fruit -- Fresh (unpeeled skin & pulp), Dried (prunes, apricots, cherries, etc ),  or stewed ( applesauce)  o Whole grain breads, pasta, etc (whole wheat)  o Bran cereals   Bulking Agents -- This type of water-retaining fiber generally is easily obtained each day by one of the following:  o Psyllium bran -- The psyllium plant is remarkable because its ground seeds can retain so much water. This product is available as Metamucil, Konsyl, Effersyllium, Per Diem Fiber, or the less expensive generic preparation in drug and health food stores. Although labeled a laxative, it really is not a laxative.  o Methylcellulose -- This is another fiber derived from wood which also  retains water. It is available as Citrucel. o Polyethylene Glycol - and artificial fiber commonly called Miralax or Glycolax.  It is helpful for people with gassy or bloated feelings with regular fiber o Flax Seed - a less gassy fiber than psyllium  No reading or other relaxing activity while on the toilet. If bowel movements take longer than 5 minutes, you are too constipated  AVOID CONSTIPATION.  High fiber and water intake usually takes care of this.  Sometimes a laxative is needed to stimulate more frequent bowel movements, but   Laxatives are not a good long-term solution as it can wear the colon out.  They can help jump-start bowels if constipated, but should be relied on constantly without discussing with your doctor o Osmotics (Milk of Magnesia, Fleets phosphosoda, Magnesium citrate, MiraLax, GoLytely) are safer than  o Stimulants (Senokot, Castor Oil, Dulcolax, Ex Lax)    o Avoid taking laxatives for more than 7 days in a row.   IF SEVERELY CONSTIPATED, try a Bowel Retraining Program: o Do not use laxatives.  o Eat a diet high in roughage, such as bran cereals and leafy vegetables.  o Drink six (6) ounces of prune or apricot juice each morning.  o Eat two (2) large servings of stewed fruit each day.  o Take one (1) heaping tablespoon of a psyllium-based bulking agent twice a day. Use sugar-free sweetener when possible to avoid excessive calories.  o Eat a normal breakfast.  o Set aside 15 minutes after breakfast to sit on the toilet, but do not strain to have a bowel movement.  o If you do not have a bowel movement by the third day, use an enema and repeat the above steps.   Controlling diarrhea o Switch to liquids and simpler foods for a few days to avoid stressing your intestines further. o Avoid dairy products (especially milk & ice cream) for a short time.  The intestines often can lose the ability to digest lactose when stressed. o Avoid foods that cause gassiness or  bloating.  Typical foods include beans and other legumes, cabbage, broccoli, and dairy foods.  Every person has some sensitivity to other foods, so listen to our body and avoid those foods that trigger problems for you. o Adding fiber (Citrucel, Metamucil, psyllium, Miralax) gradually can help thicken stools by absorbing excess fluid and retrain the  intestines to act more normally.  Slowly increase the dose over a few weeks.  Too much fiber too soon can backfire and cause cramping & bloating. o Probiotics (such as active yogurt, Align, etc) may help repopulate the intestines and colon with normal bacteria and calm down a sensitive digestive tract.  Most studies show it to be of mild help, though, and such products can be costly. o Medicines: - Bismuth subsalicylate (ex. Kayopectate, Pepto Bismol) every 30 minutes for up to 6 doses can help control diarrhea.  Avoid if pregnant. - Loperamide (Immodium) can slow down diarrhea.  Start with two tablets (4mg  total) first and then try one tablet every 6 hours.  Avoid if you are having fevers or severe pain.  If you are not better or start feeling worse, stop all medicines and call your doctor for advice o Call your doctor if you are getting worse or not better.  Sometimes further testing (cultures, endoscopy, X-ray studies, bloodwork, etc) may be needed to help diagnose and treat the cause of the diarrhea.  TROUBLESHOOTING IRREGULAR BOWELS 1) Avoid extremes of bowel movements (no bad constipation/diarrhea) 2) Miralax 17gm mixed in 8oz. water or juice-daily. May use BID as needed.  3) Gas-x,Phazyme, etc. as needed for gas & bloating.  4) Soft,bland diet. No spicy,greasy,fried foods.  5) Prilosec over-the-counter as needed  6) May hold gluten/wheat products from diet to see if symptoms improve.  7)  May try probiotics (Align, Activa, etc) to help calm the bowels down 7) If symptoms become worse call back immediately.

## 2018-01-12 NOTE — Anesthesia Preprocedure Evaluation (Addendum)
Anesthesia Evaluation  Patient identified by MRN, date of birth, ID band Patient awake    Reviewed: Allergy & Precautions, NPO status , Patient's Chart, lab work & pertinent test results  History of Anesthesia Complications Negative for: history of anesthetic complications  Airway Mallampati: II  TM Distance: >3 FB Neck ROM: Full    Dental  (+) Dental Advisory Given, Edentulous Upper, Poor Dentition,    Pulmonary Current Smoker,    breath sounds clear to auscultation       Cardiovascular negative cardio ROS   Rhythm:Regular Rate:Normal     Neuro/Psych Anxiety negative neurological ROS     GI/Hepatic Neg liver ROS, Rectal cancer   Endo/Other  negative endocrine ROS  Renal/GU negative Renal ROS     Musculoskeletal   Abdominal   Peds  Hematology negative hematology ROS (+)   Anesthesia Other Findings   Reproductive/Obstetrics                             Anesthesia Physical  Anesthesia Plan  ASA: II  Anesthesia Plan: MAC   Post-op Pain Management:    Induction:   PONV Risk Score and Plan: 3 and Propofol infusion and Treatment may vary due to age or medical condition  Airway Management Planned: Nasal Cannula  Additional Equipment: None  Intra-op Plan:   Post-operative Plan:   Informed Consent: I have reviewed the patients History and Physical, chart, labs and discussed the procedure including the risks, benefits and alternatives for the proposed anesthesia with the patient or authorized representative who has indicated his/her understanding and acceptance.   Dental advisory given  Plan Discussed with: CRNA  Anesthesia Plan Comments:         Anesthesia Quick Evaluation

## 2018-01-12 NOTE — Anesthesia Postprocedure Evaluation (Signed)
Anesthesia Post Note  Patient: [name]  Procedure(s) Performed: POUCHOSCOPY WITH BIOPSIES (N/A )     Patient location during evaluation: PACU Anesthesia Type: MAC Level of consciousness: awake and alert Pain management: pain level controlled Vital Signs Assessment: post-procedure vital signs reviewed and stable Respiratory status: spontaneous breathing, nonlabored ventilation and respiratory function stable Cardiovascular status: stable and blood pressure returned to baseline Anesthetic complications: no    Last Vitals:  Vitals:   01/12/18 1140 01/12/18 1150  BP: 105/72 119/74  Pulse: 69 62  Resp: 12 14  Temp:    SpO2: 96% 94%                  Audry Pili

## 2018-01-12 NOTE — Op Note (Signed)
[facility] Patient Name: [name] Procedure Date: 01/12/2018 MRN: [id number] Attending MD: Leighton Ruff , MD Date of Birth: 1971-12-04 CSN: [id number] Age: 47 Admit Type: Inpatient Procedure:                Pouchoscopy Indications:              History of total proctocolectomy, Anastomosis                            assessment, Personal history of malignant neoplasm                            of the colon, Chronic diarrhea, Clinically                            significant diarrhea of unexplained origin Providers:                Leighton Ruff, MD, Laverta Baltimore RN, RN, Alan Mulder, Technician Referring MD:              Medicines:                Propofol per Anesthesia Complications:            No immediate complications. Estimated Blood Loss:     Estimated blood loss was minimal. Procedure:                Pre-Anesthesia Assessment:                           - Prior to the procedure, a History and Physical                            was performed, and patient medications and                            allergies were reviewed. The patient's tolerance of                            previous anesthesia was also reviewed. The risks                            and benefits of the procedure and the sedation                            options and risks were discussed with the patient.                            All questions were answered, and informed consent                            was obtained. Prior Anticoagulants: The patient has                            taken no previous  anticoagulant or antiplatelet                            agents. ASA Grade Assessment: II - A patient with                            mild systemic disease. After reviewing the risks                            and benefits, the patient was deemed in                            satisfactory condition to undergo the procedure.                           After obtaining  informed consent, the endoscope was                            passed under direct vision. Throughout the                            procedure, the patient's blood pressure, pulse, and                            oxygen saturations were monitored continuously. The                            Colonoscope was introduced through the ileoanal                            anastomosis via the anus and advanced to the                            neo-terminal ileum. The procedure was performed                            with ease. The patient tolerated the procedure well. Scope In: Scope Out: Findings:      Patient is status-post with an ileal pouch-anal anastomosis.      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                           - No specimens collected.                           - Erythematous mucosa in the ileoanal pouch.                            Biopsied. Recommendation:           - Discharge patient to home (ambulatory).                           -  Written discharge instructions were provided to                            the patient.                           - Await pathology results. Procedure Code(s):        --- Professional ---                           [id number], Endoscopic evaluation of small intestinal                            pouch (eg, Kock pouch, ileal reservoir [S or J]);                            diagnostic, including collection of specimen(s) by                            brushing or washing, when performed (separate                            procedure) Diagnosis Code(s):        --- Professional ---                           K63.89, Other specified diseases of intestine                           Z90.49, Acquired absence of other specified parts                            of digestive tract                           Z98.0, Intestinal  bypass and anastomosis status                           Z85.038, Personal history of other malignant                            neoplasm of large intestine                           K52.9, Noninfective gastroenteritis and colitis,                            unspecified                           R19.7, Diarrhea, unspecified CPT copyright 2016 American Medical Association. All rights reserved. The codes documented in this report are preliminary and upon coder review may  be revised to meet current compliance requirements. Leighton Ruff, MD Leighton Ruff, MD 05/23/5408 10:58:49 AM This report has been signed electronically. Number of Addenda: 0

## 2018-01-12 NOTE — Anesthesia Procedure Notes (Signed)
Date/Time: 01/12/2018 10:35 AM Performed by: Claudia Desanctis, CRNA Oxygen Delivery Method: Nasal cannula

## 2018-01-13 ENCOUNTER — Encounter (HOSPITAL_COMMUNITY): Payer: Self-pay | Admitting: General Surgery

## 2018-01-19 ENCOUNTER — Ambulatory Visit: Payer: Medicaid Other | Attending: Surgery | Admitting: Physical Therapy

## 2018-01-24 ENCOUNTER — Other Ambulatory Visit: Payer: Self-pay | Admitting: *Deleted

## 2018-02-06 ENCOUNTER — Ambulatory Visit: Payer: Medicaid Other | Attending: Surgery | Admitting: Physical Therapy

## 2018-02-06 ENCOUNTER — Encounter: Payer: Self-pay | Admitting: Physical Therapy

## 2018-02-06 ENCOUNTER — Other Ambulatory Visit: Payer: Self-pay

## 2018-02-06 DIAGNOSIS — M62838 Other muscle spasm: Secondary | ICD-10-CM | POA: Insufficient documentation

## 2018-02-06 DIAGNOSIS — M25652 Stiffness of left hip, not elsewhere classified: Secondary | ICD-10-CM | POA: Diagnosis present

## 2018-02-06 DIAGNOSIS — M25651 Stiffness of right hip, not elsewhere classified: Secondary | ICD-10-CM | POA: Diagnosis present

## 2018-02-06 DIAGNOSIS — M6281 Muscle weakness (generalized): Secondary | ICD-10-CM | POA: Diagnosis present

## 2018-02-06 MED FILL — PAREGORIC LIQUID: 2 | 12 days supply | Qty: 473 | Fill #1

## 2018-02-06 NOTE — Patient Instructions (Signed)
Guide to Using a Anal Dilator ( No radiation) The anal dilator is used to stretch the anal canal from surgery or radiation. Is  a smooth plastic cylinder that is 6 inches long. It is used to stretch the anal canal to assist in having a bowel movement.  1.  Use the dilator your therapist has directed you to as follows: 2. Lubricate both the anus and tip of the dilator.  Do not use petroleum based lubricant due to increased risk of infection and more difficult to wash off.  3. Lay on your side to insert the tip of the dilator at a right angle to the rectum and lightly insert the dilator.  Exhale as you gently ease the dilator into the anal canal.  Breathe in deeply and inch the dilator deeper. See below for frequency: Anal stenosis- holds dilator in 2-3 minutes 2 times daily for 3-4 months  4. Remove the dilator. Wash your hands and the dilator with warm water and soap. Let the dilator dry completely to prevent bacteria build-up.    Toileting Techniques for Bowel Movements (Defecation) Using your belly (abdomen) and pelvic floor muscles to have a bowel movement is usually instinctive.  Sometimes people can have problems with these muscles and have to relearn proper defecation (emptying) techniques.  If you have weakness in your muscles, organs that are falling out, decreased sensation in your pelvis, or ignore your urge to go, you may find yourself straining to have a bowel movement.  You are straining if you are: . holding your breath or taking in a huge gulp of air and holding it  . keeping your lips and jaw tensed and closed tightly . turning red in the face because of excessive pushing or forcing . developing or worsening your  hemorrhoids . getting faint while pushing . not emptying completely and have to defecate many times a day  If you are straining, you are actually making it harder for yourself to have a bowel movement.  Many people find they are pulling up with the pelvic floor muscles  and closing off instead of opening the anus. Due to lack pelvic floor relaxation and coordination the abdominal muscles, one has to work harder to push the feces out.  Many people have never been taught how to defecate efficiently and effectively.  Notice what happens to your body when you are having a bowel movement.  While you are sitting on the toilet pay attention to the following areas: . Jaw and mouth position . Angle of your hips   . Whether your feet touch the ground or not . Arm placement  . Spine position . Waist . Belly tension . Anus (opening of the anal canal)  An Evacuation/Defecation Plan   Here are the 4 basic points:  1. Lean forward enough for your elbows to rest on your knees 2. Support your feet on the floor or use a low stool if your feet don't touch the floor  3. Push out your belly as if you have swallowed a beach ball-you should feel a widening of your waist 4. Open and relax your pelvic floor muscles, rather than tightening around the anus      The following conditions my require modifications to your toileting posture:  . If you have had surgery in the past that limits your back, hip, pelvic, knee or ankle flexibility . Constipation   Your healthcare practitioner may make the following additional suggestions and adjustments:  1) Sit on  the toilet  a) Make sure your feet are supported. b) Notice your hip angle and spine position-most people find it effective to lean forward or raise their knees, which can help the muscles around the anus to relax  c) When you lean forward, place your forearms on your thighs for support  2) Relax suggestions a) Breath deeply in through your nose and out slowly through your mouth as if you are smelling the flowers and blowing out the candles. b) To become aware of how to relax your muscles, contracting and releasing muscles can be helpful.  Pull your pelvic floor muscles in tightly by using the image of holding back gas, or  closing around the anus (visualize making a circle smaller) and lifting the anus up and in.  Then release the muscles and your anus should drop down and feel open. Repeat 5 times ending with the feeling of relaxation. c) Keep your pelvic floor muscles relaxed; let your belly bulge out. d) The digestive tract starts at the mouth and ends at the anal opening, so be sure to relax both ends of the tube.  Place your tongue on the roof of your mouth with your teeth separated.  This helps relax your mouth and will help to relax the anus at the same time.  3) Empty (defecation) a) Keep your pelvic floor and sphincter relaxed, then bulge your anal muscles.  Make the anal opening wide.  b) Stick your belly out as if you have swallowed a beach ball. c) Make your belly wall hard using your belly muscles while continuing to breathe. Doing this makes it easier to open your anus. d) Breath out and give a grunt (or try using other sounds such as ahhhh, shhhhh, ohhhh or grrrrrrr).  4) Finish a) As you finish your bowel movement, pull the pelvic floor muscles up and in.  This will leave your anus in the proper place rather than remaining pushed out and down. If you leave your anus pushed out and down, it will start to feel as though that is normal and give you incorrect signals about needing to have a bowel movement.        [facility] [address] [place], [place] [address]

## 2018-02-06 NOTE — Therapy (Addendum)
[facility] [address], [place], [place], [address] Phone: [phone]   Fax:  [phone]  Physical Therapy Evaluation  Patient Details  Name: [name] MRN: [id number] Date of Birth: Aug 20, 1971 Referring Provider: Michael Boston, MD   Encounter Date: 02/06/2018  PT End of Session - 02/06/18 1635    Visit Number  1    Date for PT Re-Evaluation  05/01/18    PT Start Time  1540    PT Stop Time  1625    PT Time Calculation (min)  45 min    Activity Tolerance  Patient tolerated treatment well    Behavior During Therapy  [facility] for tasks assessed/performed       Past Medical History:  Diagnosis Date  . Anxiety   . Cancer ([place]) 12/01/2016   colon   . Family history of colon cancer   . Poor dental hygiene     Past Surgical History:  Procedure Laterality Date  . EUS N/A 07/29/2016   Procedure: LOWER ENDOSCOPIC ULTRASOUND (EUS);  Surgeon: Milus Banister, MD;  Location: [facility];  Service: Endoscopy;  Laterality: N/A;  . ILEOSTOMY CLOSURE N/A 09/22/2017   Procedure: TAKEDOWN OF LOOP ILEOSTOMY;  Surgeon: Michael Boston, MD;  Location: [facility];  Service: General;  Laterality: N/A;  . PORTACATH PLACEMENT N/A 01/26/2017   Procedure: PLACEMENT OF PORT-A-CATH CENTRAL LINE WITH FLUOROSCOPY AND ULTRASOUND;  Surgeon: Michael Boston, MD;  Location: [facility];  Service: General;  Laterality: N/A;  . POUCHOSCOPY N/A 09/21/2017   Procedure: POUCHOSCOPY;  Surgeon: Leighton Ruff, MD;  Location: [facility];  Service: Endoscopy;  Laterality: N/A;  . POUCHOSCOPY N/A 01/12/2018   Procedure: POUCHOSCOPY WITH BIOPSIES;  Surgeon: Leighton Ruff, MD;  Location: [facility];  Service: Endoscopy;  Laterality: N/A;  Local  . PROCTOSCOPY N/A 12/01/2016   Procedure: RIGID PROCTOSCOPY;  Surgeon: Michael Boston, MD;  Location: [facility];  Service: General;  Laterality: N/A;  . TOE AMPUTATION Left   . XI ROBOTIC ASSISTED LOWER ANTERIOR  RESECTION N/A 12/01/2016   Procedure: XI ROBOTIC ASSISTED PROCTOCOLECTOMY WITH ILLEOPOUCH ANASTAMOSIS WITH DIVERTING ILLEOSTOMY;  Surgeon: Michael Boston, MD;  Location: [facility];  Service: General;  Laterality: N/A;    There were no vitals filed for this visit.   Subjective Assessment - 02/06/18 1545    Subjective  Patient is presenting to clinic due to iliostomy reversal and is now having inability to have complete bowel movement and sometimes having to go 20-30x per day.  He reports having fecal incontinence and uses 4 pads on a good day and up to 15 on a bad day.     Patient is accompained by:  Family member wife    Limitations  Other (comment) self care    Patient Stated Goals  be more regular with toileting    Currently in Pain?  Yes    Pain Score  9  when having BM    Pain Location  Rectum    Pain Orientation  Mid    Pain Descriptors / Indicators  Burning    Pain Type  Surgical pain    Pain Onset  More than a month ago    Pain Frequency  Intermittent    Aggravating Factors   bowel movements         [facility] PT Assessment - 02/06/18 0001      Assessment   Medical Diagnosis  M62.89 (ICD-10-CM) - Pelvic floor dysfunction    Referring  Provider  Michael Boston, MD    Prior Therapy  No      Precautions   Precautions  None      Restrictions   Weight Bearing Restrictions  No      Balance Screen   Has the patient fallen in the past 6 months  No      [place] residence    Living Arrangements  Spouse/significant other;Children      Prior Function   Level of Independence  Independent    Vocation  -- Not currently working      Cognition   Overall Cognitive Status  Within Functional Limits for tasks assessed      Posture/Postural Control   Posture/Postural Control  Postural limitations      ROM / Strength   AROM / PROM / Strength  PROM;Strength      PROM   Overall PROM Comments  biateral hip 50% limited IR/ER      Flexibility   Soft  Tissue Assessment /Muscle Length  yes    Hamstrings  20% limited      Palpation   Palpation comment  iliostomy scar tissue adhesions      Ambulation/Gait   Gait Pattern  Within Functional Limits             Objective measurements completed on examination: See above findings.    Pelvic Floor Special Questions - 02/06/18 0001    Prior Pelvic/Prostate Exam  Yes    Urinary Leakage  No    Urinary frequency  Fecal urgency 20-30x/day    Fecal incontinence  Yes 4-15 pads    Falling out feeling (prolapse)  No    External Perineal Exam  pt informed and consent given to perform internal assessment    Skin Integrity  Irritaion present at;[facility]    Skin Integrity Irritation Present at  around anus    Pelvic Floor Internal Exam  pt informed and consent was given to perform internal soft tissue assessment    Exam Type  Rectal    Palpation  soft tissue adhesion, able to insert one index finger to second knuckle    Strength  weak squeeze, no lift    Strength # of reps  4    Strength # of seconds  2               PT Education - 02/06/18 1634    Education provided  Yes    Education Details  anal dilators, toilet techniques    Person(s) Educated  Patient;Spouse    Methods  Explanation;Demonstration    Comprehension  Verbalized understanding;Returned demonstration       PT Short Term Goals - 02/06/18 1745      PT SHORT TERM GOAL #1   Title  ind with using Hegar dilators at home    Time  4    Period  Weeks    Status  New    Target Date  03/06/18      PT SHORT TERM GOAL #2   Title  pt will report 25% less pain with bowel movement    Time  4    Period  Weeks    Status  New    Target Date  03/06/18      PT SHORT TERM GOAL #3   Title  pt will report < or = to 15 BM per day due to improved ability to have more complete bowel movements  Time  6    Period  Weeks    Status  New    Target Date  03/20/18        PT Long Term Goals - 02/06/18 1741      PT LONG TERM  GOAL #1   Title  pt reports 50% less pain with BM    Baseline  9/10    Time  12    Period  Weeks    Status  New    Target Date  05/01/18      PT LONG TERM GOAL #2   Title  pt will report able to fully empty bowel during toileting so that he is only having BM = or < 5x/day    Time  12    Period  Weeks    Status  New    Target Date  05/01/18      PT LONG TERM GOAL #3   Title  pt will be independent with advanced HEP    Baseline  does not know     Time  12    Period  Weeks    Status  New    Target Date  05/01/18      PT LONG TERM GOAL #4   Title  pt will be able to have bowel movement with diameter of quarter    Baseline  small pieces or liquid    Time  12    Period  Weeks    Status  New    Target Date  05/01/18             Plan - 02/06/18 1722    Clinical Impression Statement  Pt has presented to clinic s/p iliostomy reversal.  He has scar tissue adhesion around incision scar.  He is currently experiency fecal frequency, urgency, and incontinence.  He has pain with bowel movements and inability to have complete bowel movement.  Pt has restrictions and fascia and muscle tissue upon assessment of soft tissue.  Pt has reduced hamsting and bilateral hip PROM.  Pt has decreased pelvic floor muscle strength and endurance with 2/5 MMT and able to hold 2 sec for 4 reps. Pt has redness and damaged skin tissue in the area surrounding the anus due to chronic fecal exposure and irritation from wiping.  Pt will benefit from skilled PT to address impairments and return to maximum function and improved quality of life.      History and Personal Factors relevant to plan of care:  iliostomy takedown, lynch syndrome with rectal cancer, proctocolectomy    Clinical Presentation  Unstable    Clinical Presentation due to:  pt has inconsistency of symptoms    Clinical Decision Making  Moderate    Rehab Potential  Good    PT Frequency  1x / week    PT Duration  12 weeks    PT  Treatment/Interventions  ADLs/Self Care Home Management;Biofeedback;Cryotherapy;Electrical Stimulation;Moist Heat;Therapeutic activities;Therapeutic exercise;Neuromuscular re-education;Patient/family education;Manual techniques;Scar mobilization;Taping;Dry needling;Passive range of motion    PT Next Visit Plan  review toilet techniques, dilator protocol, stretches, breathing and bulging pelvic floor, biofeedback    Consulted and Agree with Plan of Care  Patient;Family member/caregiver    Family Member Consulted  wife       Patient will benefit from skilled therapeutic intervention in order to improve the following deficits and impairments:  Decreased coordination, Decreased strength, Decreased range of motion, Decreased scar mobility, Pain, Increased fascial restricitons, Increased muscle spasms, Impaired flexibility  Visit Diagnosis:  Other muscle spasm  Stiffness of left hip, not elsewhere classified  Stiffness of right hip, not elsewhere classified  Muscle weakness (generalized)     Problem List Patient Active Problem List   Diagnosis Date Noted  . Stricture of ileoanal anastomosis 01/12/2018  . Muscle spasm 04/11/2017  . Encounter for antineoplastic chemotherapy 04/11/2017  . Diarrhea 02/17/2017  . 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 Hypomagnesemia 12/02/2016  . 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 10/13/2016  . Family history of colon cancer   . Anxiety 09/14/2016  . Rectal adenocarcinoma s/p protctocolectomy, IPAA "J" pouch 12/01/2016 08/06/2016    Zannie Cove, PT 02/06/2018, 5:48 PM  [facility] [address], [place], [place], [address] Phone: [phone]   Fax:  [phone]  Name: [name] MRN: [id number] Date of Birth: 1971-06-12  PHYSICAL THERAPY DISCHARGE SUMMARY  Visits from Start of Care: 1  Current  functional level related to goals / functional outcomes: eval only, did not return   Remaining deficits: eval only, did not return   Education / Equipment: HEP  Plan: Patient agrees to discharge.  Patient goals were not met. Patient is being discharged due to not returning since the last visit.  ?????    Google, PT 03/10/18 7:44 AM

## 2018-02-14 ENCOUNTER — Other Ambulatory Visit: Payer: Self-pay | Admitting: Hematology

## 2018-02-15 NOTE — Progress Notes (Signed)
[place]  [phone] [phone]  Clinic follow Up Note   Patient Care Team: Patient, No Pcp Per as PCP - General (General Practice) Tyrone Boston, MD as Consulting Physician (General Surgery) [name], Kirke Corin, MD as Consulting Physician (Gastroenterology) Tyrone Rudd, MD as Consulting Physician (Radiation Oncology) Tyrone Merle, MD as Consulting Physician (Oncology)   Date of Service:  02/16/2018  CHIEF COMPLAINTS:  Follow up rectal cancer  Oncology History   Rectal adenocarcinoma [facility])   Staging form: Colon and Rectum, AJCC 7th Edition   - Clinical stage from 07/28/2016: Stage IIIB (T3, N2a, M0) - Signed by Tyrone Merle, MD on 08/06/2016      Rectal adenocarcinoma s/p protctocolectomy, IPAA "J" pouch 12/01/2016   07/26/2016 Imaging    CT chest, abdomen and pelvis with contrast showed nodular thickening of the rectal wall, enlarged perirectal lymph nodes. Diverticulosis. Nonspecific low-level lymphadenopathy. Lower lobe emphysema, small 3-4 mm left lower lobe lung nodules.      07/28/2016 Initial Diagnosis    Rectal adenocarcinoma ([place])      07/28/2016 Initial Biopsy    Rectal mass biopsy showed invasive adenocarcinoma, polyps in the ascending colon and rectum showed tubular adenoma       07/28/2016 Procedure    Colonoscopy showed a nearly circumferential mass in the distal rectum, 4 polyps in the descending colon, diverticulosis in the left colon.       07/29/2016 Procedure    Low EUS showed clearly malignant 6 cm long, nearly circumferential mass in the distal rectum, with distal edge located to 1 cm from the annual approach, multiple prior rectal lymph nodes (6) are suspicious ,uT3 N2       08/17/2016 - 09/24/2016 Radiation Therapy    Neoadjuvant radiation to his rectal cancer 1) Rectum/ 45 Gy in 25 fx                      2) Boost/ 5.4 Gy in 3 fx       08/17/2016 - 09/24/2016 Chemotherapy    Xeloda 1500 mg twice daily with concurrent  irradiation      08/18/2016 Imaging    PET scan showed hypermetabolic rectal mass, perirectal node 20m with mild increased uptake, no hypermetabolic distant metastasis, small pulmonary nodules are too small to characterize by PET.       11/29/2016 Imaging    CT of the C/A/P W Contrast IMPRESSION: 1. Interval treatment changes within the perirectal fat with otherwise stable rectosigmoid colon wall thickening. 2. No progressive adenopathy or distant metastases identified. 3. Stable bilateral pulmonary nodules. Short-term stability (for 4 months) is reassuring, although the nodules are too small to optimally characterize by previous PET-CT. Continued CT follow-up recommended.      12/01/2016 Surgery    Colon total resection and proctocolectomy for rectal cancer and Lynch syndrome       12/01/2016 Pathology Results    Proctocolectomy showed scattered small residual foci of invasive moderately differentiated adenocarcinoma embedded in submucosa tissue with extensive neoadjuvant effect, 2.5 cm in greatest time mention, adenocarcinoma invades through muscularis mucosa and involves subserosal soft tissue, margins are negative, 2 of 35 lymph nodes are positive for metastatic adenocarcinoma, 2 calcified and necrotic soft tissue nodules are also present without visible tumor seen.      01/07/2017 - 05/26/2017 Chemotherapy    Adjuvant chemo CAPOX, changed to FOLFOX from cycle 2 due to poor tolerance, a total of 4.5 months therapy  01/26/2017 Surgery    PLACEMENT OF PORT-A-CATH CENTRAL LINE WITH FLUOROSCOPY AND ULTRASOUND      06/21/2017 Imaging    CT Chest w contrast 06/21/2017 IMPRESSION: 1. No evidence of metastatic disease. 2.  Emphysema (ICD10-J43.9). 3. Scattered subpleural pulmonary nodules measure 5 mm or less in size, stable and likely subpleural lymph nodes.      09/21/2017 Procedure    Pouchoscopy by Dr. Marcello [name] on 09/21/17 IMPRESSION  - The examined portion of the ileum was  normal. - No specimens collected.      09/22/2017 Surgery    TAKEDOWN OF LOOP ILEOSTOMY by Dr. Johney [name] on 09/22/17  Diagnosis 09/22/17  Ileostomy, loop SKIN AND COLONIC MUCOSA NEGATIVE FOR CARCINOMA      12/06/2017 Imaging    CT CAP W Contrast 12/06/17 IMPRESSION: 1. Postoperative changes of total proctocolectomy and ileoanal anastomosis without evidence of metastatic disease. Ileoanal anastomosis appears tethered to the sacrum by presacral thickening. 2.  Emphysema (ICD10-J43.9).      01/12/2018 Procedure    Pouchoscopy by Dr. 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 - No specimens collected. - Erythematous mucosa in the ileoanal pouch. Biopsied.   Pathology results  Diagnosis 01/12/18 Rectum, biopsy, J Pouch mucosa - SMALL BOWEL TYPE MUCOSA WITH CHRONIC INFLAMMATION - NO GRANULOMATA, DYSPLASIA, OR MALIGNANCY IDENTIFIED       HISTORY OF PRESENTING ILLNESS:  [name] 47 y.o. male is here because of His newly diagnosed rectal cancer. He is accompanied by his girlfriend to our multidisciplinary GI clinic today.  He has had rectal pain for 6 weeks, and mild rectal bleeding with BM, he goes 4 times a day, very little each time, and pencil shaped stool, he has lost 30 lbs so far. His appetite remains to be good, he eats well, no cough, nausea, no abd pain. He has a moderate fatigue, he will 2 tolerate his routine activities including his job without much difficulty. He does not have a primary care physician, and has not seen doctors for many years.  He presents to emergency room on 07/26/2016 for the rectal pain and bleeding, and was referred to gastroenterologist [name]. He underwent colonoscopy on 07/28/2016, which showed 4 polyps in the ascending colon, and a circumferential distal rectal mass. Biopsy of the rectal mass showed adenocarcinoma. He underwent EUS by Dr. Ardis [name] on 07/29/2016 which showed a T3 N2 disease.   CURRENT THERAPY:  Surveillance   INTERIM HISTORY  [name] returns for follow-up for his rectal cancer. Since his last visit he underwent a pouchoscopy on 01/12/18 by Dr. Marcello [name] for his persistent watery stool. Results were overall benign with evidence of chronic inflammation. He had a CT CAP on 12/06/17 which showed no evidence of malignancy or metastatic disease.   Today he presents to the clinic today noting he still has diarrhea. This morning he has had 12 BM in 2 hours. He has been seen by Dr. Johney [name]. He notes he started PT and will continue to go. He no longer uses Imodium. He is now on Paregoric. He notes he has not taken XANAX in a month. He would like a refill for this.   On review of symptoms, pt notes continued diarrhea with some incontinence. He wears a diaper when he leaves the house. He notes having the sensation to pee but not being able to go sometimes. He still has neuropathy, stable. He takes Gabapentin '300mg'$  for this BID.  He denies blood in the stool.  MEDICAL HISTORY:  Past Medical History:  Diagnosis Date  . Anxiety   . Cancer ([place]) 12/01/2016   colon   . Family history of colon cancer   . Poor dental hygiene        SURGICAL HISTORY: Past Surgical History:  Procedure Laterality Date  . EUS N/A 07/29/2016   Procedure: LOWER ENDOSCOPIC ULTRASOUND (EUS);  Surgeon: Milus Banister, MD;  Location: [facility];  Service: Endoscopy;  Laterality: N/A;  . ILEOSTOMY CLOSURE N/A 09/22/2017   Procedure: TAKEDOWN OF LOOP ILEOSTOMY;  Surgeon: Tyrone Boston, MD;  Location: [facility];  Service: General;  Laterality: N/A;  . PORTACATH PLACEMENT N/A 01/26/2017   Procedure: PLACEMENT OF PORT-A-CATH CENTRAL LINE WITH FLUOROSCOPY AND ULTRASOUND;  Surgeon: Tyrone Boston, MD;  Location: [place];  Service: General;  Laterality: N/A;  . POUCHOSCOPY N/A 09/21/2017   Procedure: POUCHOSCOPY;  Surgeon: Leighton Ruff, MD;  Location: [facility];  Service: Endoscopy;  Laterality: N/A;  .  POUCHOSCOPY N/A 01/12/2018   Procedure: POUCHOSCOPY WITH BIOPSIES;  Surgeon: Leighton Ruff, MD;  Location: [facility];  Service: Endoscopy;  Laterality: N/A;  Local  . PROCTOSCOPY N/A 12/01/2016   Procedure: RIGID PROCTOSCOPY;  Surgeon: Tyrone Boston, MD;  Location: [facility];  Service: General;  Laterality: N/A;  . TOE AMPUTATION Left   . XI ROBOTIC ASSISTED LOWER ANTERIOR RESECTION N/A 12/01/2016   Procedure: XI ROBOTIC ASSISTED PROCTOCOLECTOMY WITH ILLEOPOUCH ANASTAMOSIS WITH DIVERTING ILLEOSTOMY;  Surgeon: Tyrone Boston, MD;  Location: [facility];  Service: General;  Laterality: N/A;    SOCIAL HISTORY: Social History   Socioeconomic History  . Marital status: Married    Spouse name: Not on file  . Number of children: 3  . Years of education: Not on file  . Highest education level: Not on file  Social Needs  . Financial resource strain: Not on file  . Food insecurity - worry: Not on file  . Food insecurity - inability: Not on file  . Transportation needs - medical: Not on file  . Transportation needs - non-medical: Not on file  Occupational History  . Not on file  Tobacco Use  . Smoking status: Current Every Day Smoker    Packs/day: 1.50    Years: 30.00    Pack years: 45.00    Types: Cigarettes    Last attempt to quit: 12/01/2016    Years since quitting: 1.2  . Smokeless tobacco: Never Used  . Tobacco comment: reduced smoking to 2-3 daily  Substance and Sexual Activity  . Alcohol use: No  . Drug use: No    Comment: patient denies  . Sexual activity: Not on file  Other Topics Concern  . Not on file  Social History Narrative   Lives with significant other, [name]   Have a 44 year old son   Smoker      [place] HISTORY: Family History  Problem Relation Age of Onset  . Diabetes Mother   . COPD Father   . Colon cancer Maternal Aunt        dx in her 76s  . Diabetes Maternal Grandmother   . Brain cancer Maternal Grandfather   .  Lung cancer Paternal Grandfather   . Colon cancer Cousin        dx in her 57s  . Bone cancer Sister 8  . Pancreatic cancer Neg Hx   . Esophageal cancer Neg Hx   . Stomach cancer Neg Hx   .  Liver disease Neg Hx    He lives with his girlfriend and their 52 yo son, works for [employer]   ALLERGIES:  has No Known Allergies.  MEDICATIONS:  Current Outpatient Medications  Medication Sig Dispense Refill  . ALPRAZolam (XANAX) 0.5 MG tablet Take 1 tablet (0.5 mg total) by mouth 2 (two) times daily as needed for anxiety or sleep. 30 tablet 0  . gabapentin (NEURONTIN) 300 MG capsule Take 1 capsule (300 mg total) by mouth 2 (two) times daily. 60 capsule 3  . ibuprofen (ADVIL,MOTRIN) 200 MG tablet Take 400 mg by mouth every 6 (six) hours as needed for headache or moderate pain.    . paregoric 2 MG/5ML solution Take 5-10 mLs by mouth 4 (four) times daily as needed for diarrhea or loose stools.     Current Facility-Administered Medications  Medication Dose Route Frequency Provider Last Rate Last Dose  . 0.9 %  sodium chloride infusion  500 mL Intravenous Continuous [name], Kirke Corin, MD       Facility-Administered Medications Ordered in Other Visits  Medication Dose Route Frequency Provider Last Rate Last Dose  . sodium chloride flush (NS) 0.9 % injection 10 mL  10 mL Intravenous PRN Tyrone Merle, MD   10 mL at 11/18/17 [id number]    REVIEW OF SYSTEMS:  Constitutional: Denies fevers, chills or abnormal night sweats  Eyes: Denies blurriness of vision, double vision or watery eyes Ears, nose, mouth, throat, and face: Denies mucositis or sore throat Respiratory: Denies cough, dyspnea or wheezes Cardiovascular: Denies palpitation, chest discomfort or lower extremity swelling Gastrointestinal:  Denies heartburn (+) significant diarrhea with incontinence  Skin: Denies abnormal skin rashes Lymphatics: Denies new lymphadenopathy or easy bruising Neurological: negative  Behavioral/Psych: (+) anxiety/depression All  other systems were reviewed with the patient and are negative.  PHYSICAL EXAMINATION:  ECOG PERFORMANCE STATUS: 1  Vitals:   02/16/18 0840  BP: 124/80  Pulse: 63  Resp: 20  Temp: 98 F (36.7 C)  SpO2: 100%   Filed Weights   02/16/18 0840  Weight: 153 lb 12.8 oz (69.8 kg)     GENERAL:alert, no distress and comfortable SKIN: skin color, texture, turgor are normal, no rashes or significant lesions EYES: normal, conjunctiva are pink and non-injected, sclera clear OROPHARYNX:no exudate, no erythema and lips, buccal mucosa, and tongue normal  NECK: supple, thyroid normal size, non-tender, without nodularity LYMPH:  no palpable lymphadenopathy in the cervical, axillary or inguinal LUNGS: clear to auscultation and percussion with normal breathing effort HEART: regular rate & rhythm and no murmurs and no lower extremity edema ABDOMEN:abdomen soft, non-tender and normal bowel sounds.   (+) Skin hyperpigmentation and scar tissue at previous colostomy site, surgical incision healed well with no tenderness.  Musculoskeletal:no cyanosis of digits and no clubbing  PSYCH: alert & oriented x 3 with fluent speech NEURO: no focal motor/sensory deficits  LABORATORY DATA:  I have reviewed the data as listed CBC Latest Ref Rng & Units 02/16/2018 11/18/2017 09/30/2017  WBC 4.0 - 10.3 K/uL 7.4 6.5 5.6  Hemoglobin 13.0 - 17.1 g/dL 16.2 14.9 14.4  Hematocrit 38.4 - 49.9 % 48.7 44.6 43.1  Platelets 140 - 400 K/uL 185 187 191   CMP Latest Ref Rng & Units 02/16/2018 11/18/2017 09/30/2017  Glucose 70 - 140 mg/dL 71 104 100  BUN 7 - 26 mg/dL 5(L) 8.3 11.8  Creatinine 0.70 - 1.30 mg/dL 0.86 0.9 0.8  Sodium 136 - 145 mmol/L 141 139 138  Potassium 3.5 - 5.1  mmol/L 4.0 3.6 3.8  Chloride 98 - 109 mmol/L 110(H) - -  CO2 22 - 29 mmol/L 26 26 21(L)  Calcium 8.4 - 10.4 mg/dL 8.2(L) 8.7 8.4  Total Protein 6.4 - 8.3 g/dL 5.0(L) 4.9(L) 5.5(L)  Total Bilirubin 0.2 - 1.2 mg/dL 0.4 0.33 0.36  Alkaline Phos 40 -  150 U/L 44 61 62  AST 5 - 34 U/L '19 24 18  '$ ALT 0 - 55 U/L '14 15 15   '$ Pathology report   Diagnosis 01/12/18 Rectum, biopsy, J Pouch mucosa - SMALL BOWEL TYPE MUCOSA WITH CHRONIC INFLAMMATION - NO GRANULOMATA, DYSPLASIA, OR MALIGNANCY IDENTIFIED   TAKEDOWN OF LOOP ILEOSTOMY by Dr. Johney [name] on 09/22/17  Diagnosis  Ileostomy, loop SKIN AND COLONIC MUCOSA NEGATIVE FOR CARCINOMA   Diagnosis 07/28/2016 1. Surgical [P], descending, polyps(2) -TUBULAR ADENOMAS. -NO HIGH GRADE DYSPLASIA OR MALIGNANCY IDENTIFIED. 2. Surgical [P], rectal mass -INVASIVE ADENOCARCINOMA. -SEE COMMENT. 3. Surgical [P], rectum, polyps(2) -TUBULAR ADENOMAS. -NO HIGH GRADE DYSPLASIA OR MALIGNANCY IDENTIFIED.    Diagnosis 12/01/2016 1. Colon, total resection (incl lymph nodes), proctocolectomy - SCATTERED SMALL RESIDUAL FOCI (EACH LESS THAN 0.3 CM) OF INVASIVE MODERATELY DIFFERENTIATED ADENOCARCINOMA EMBEDDED IN SUBMUCOSAL TISSUE WITH EXTENSIVE NEOADJUVANT EFFECT. - TUMOR IS PRESENT WITHIN A REGION MEASURING APPROXIMATELY 2.5 CM IN GREATEST DIMENSION. - ADENOCARCINOMA INVADES THROUGH MUSCULARIS MUCOSA AND INVOLVES SUBSEROSAL SOFT TISSUE. - MARGINS ARE NEGATIVE. - TWO OF THIRTY-FIVE LYMPH NODES ARE POSITIVE FOR METASTATIC ADENOCARCINOMA (2/35). - LYMPH NODES ALSO DEMONSTRATE VARIABLE DEGREES OF NEOADJUVANT EFFECT. - TWO CALCIFIED AND NECROTIC SOFT TISSUE NODULES ARE ALSO PRESENT WITHOUT VIABLE TUMOR SEEN. - SEE ONCOLOGY TEMPLATE. 2. Colon, resection margin (donut), distal ring final distal margin - BENIGN ANAL MUCOSA. - NO DYSPLASIA OR TUMOR SEEN.   PROCEDURES  Pouchoscopy by Dr. 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 - No specimens collected. - Erythematous mucosa in the ileoanal pouch. Biopsied.  Pouchoscopy by Dr. Marcello [name] on 09/21/17 IMPRESSION  - The examined portion of the ileum was normal. - No specimens collected.   RADIOGRAPHIC STUDIES: I have personally  reviewed the radiological images as listed and agreed with the findings in the report.   CT CAP W Contrast 12/06/17 IMPRESSION: 1. Postoperative changes of total proctocolectomy and ileoanal anastomosis without evidence of metastatic disease. Ileoanal anastomosis appears tethered to the sacrum by presacral thickening. 2.  Emphysema (ICD10-J43.9).  CT Chest w contrast 06/21/2017 IMPRESSION: 1. No evidence of metastatic disease. 2.  Emphysema (ICD10-J43.9). 3. Scattered subpleural pulmonary nodules measure 5 mm or less in size, stable and likely subpleural lymph nodes.  EUS 07/29/2016 -Clearly malignant 6cm long, nearly circumferential mass in the distal rectum with distal edge located 1cm from the anal verge. If this is rectal adenocarcinoma it is uT3N2a, Stage IIIa.  COLONOSCOPY 07/28/2016 - Rectal mass. - Four 4 mm polyps in the rectum and in the descending colon, removed with a cold snare. Resected and retrieved. - Diverticulosis in the left colon. - Likely malignant tumor in the distal rectum. Biopsied. Tattooed.  CT of the chest/abd/pelvis with contrast 11/29/2016 IMPRESSION: 1. Interval treatment changes within the perirectal fat with otherwise stable rectosigmoid colon wall thickening. 2. No progressive adenopathy or distant metastases identified. 3. Stable bilateral pulmonary nodules. Short-term stability (for 4 months) is reassuring, although the nodules are too small to optimally characterize by previous PET-CT. Continued CT follow-up recommended.   ASSESSMENT & PLAN: 47 y.o. male without significant past medical history presented with rectal pain and bleeding.  1. 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 He also has a slightly prominent periaortic lymph node, about 8 mm. -I previously reviewed his 08/18/16 PET CT scan findings with patient and his girlfriend, the small lung nodules and periaortic lymph node are not hypermetabolic, no definitive evidence of metastasis on the PET scan. -He previously completed neoadjuvant chemoradiation 08/17/16-09/24/16, tolerated moderate well overall.  -I previously reviewed his surgical pathology findings from 01/26/17 in great detail. He had computed surgical resection, but unfortunately still has significant residual disease including 2 positive lymph nodes. -He completed adjuvant FOLFOX 01/07/17-05/26/17 -I reviewed his surveillance CT from 06/20/2017, which showed small and stable pulmonary nodules, no other new lesions, no evidence of metastases.  -His underwent ileostomy reversal by Dr. Johney [name] on 09/22/17, has no residual pain and is healing well.  -We reviewed his 11/30/17 CT CAP which showed no evidence of malignancy or metastatic disease.  Multiple small lung nodules are stable, likely benign.  -We will repeat a CT chest without contrast before next visit for follow-up of his lung nodules - will repeat CT abd/pel in 11/2018.  -It has been 1.5 years since his diagnosis. I discussed he should be able to remove his PAC after next visit as the risk of colon cancer recurrence is highest in the first 2 years. Will continue port flushes for now.  -From a cancer standpoint he is clinically doing well. Labs reviewed, CBC overall WNL. We'll continue surveillance.  -F/u in 18 weeks     2. 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  -I previously set him up for IV fluids 2-3 times a  week, off now  -Diarrhea increased since colostomy reversal, He previously increased lomotil to 8 a day.   -Given his persistent watery stool despite maximal medical management he underwent pouchoscopy by Dr. Marcello [name] for further evaluation on 01/12/18. Results showed chronic inflammation, overall benign. -He was seen by Dr. Johney [name] who stopped his lomotil and prescribed paregoric solution   3. 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 -He has undergone total colectomy and proctocolectomy, his risk of secondary colon cancer is minimal -I discussed with the pt other cancer risks from age syndrome, I previously recommend him to have EGD every 3-5 years, annual urinalysis for GU cancer screening.   4. Anxiety, Depression  -He previously felt nervous and anxious since his cancer diagnosis, much improved after starting treatment  -We discussed how his diarrhea and incontinence can contribute to his anxiety.  -Continue Xanax as needed only, refilled today   5. Social issues  -He is out of work, he wants to apply for Medicaid and disability  -He was seen by our Education officer, museum  6.  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 He will continue Gabapentin '300mg'$  BID, refilled today  Plan  -Refilled Gabapentin and XANAX today  -Port flush every 6 weeks X3 -lab and F/u in 18 weeks  with CT chest wo contrast before visit   All questions were answered. The patient knows to call the clinic with any problems, questions or concerns. I spent 20 minutes counseling the patient face to face. The total time spent in the appointment was 25 minutes and more than 50% was on counseling.   Tyrone Merle, MD 02/16/2018 9:20 AM    This document serves as a record of services personally performed by Tyrone Merle, MD. It was created on her behalf by Joslyn Devon, a  trained medical scribe. The creation of this record is based on the scribe's personal observations and the provider's statements to them.   I have reviewed the above documentation for accuracy and completeness, and I agree with the above.

## 2018-02-16 ENCOUNTER — Encounter: Payer: Self-pay | Admitting: Hematology

## 2018-02-16 ENCOUNTER — Inpatient Hospital Stay: Payer: Medicaid Other | Attending: Hematology | Admitting: Hematology

## 2018-02-16 ENCOUNTER — Other Ambulatory Visit: Payer: Medicaid Other

## 2018-02-16 ENCOUNTER — Inpatient Hospital Stay: Payer: Medicaid Other

## 2018-02-16 VITALS — BP 124/80 | HR 63 | Temp 98.0°F | Resp 20 | Ht 71.0 in | Wt 153.8 lb

## 2018-02-16 DIAGNOSIS — T451X5A Adverse effect of antineoplastic and immunosuppressive drugs, initial encounter: Secondary | ICD-10-CM | POA: Insufficient documentation

## 2018-02-16 DIAGNOSIS — F418 Other specified anxiety disorders: Secondary | ICD-10-CM | POA: Insufficient documentation

## 2018-02-16 DIAGNOSIS — G620 Drug-induced polyneuropathy: Secondary | ICD-10-CM | POA: Insufficient documentation

## 2018-02-16 DIAGNOSIS — R918 Other nonspecific abnormal finding of lung field: Secondary | ICD-10-CM | POA: Diagnosis not present

## 2018-02-16 DIAGNOSIS — C20 Malignant neoplasm of rectum: Secondary | ICD-10-CM | POA: Insufficient documentation

## 2018-02-16 DIAGNOSIS — R197 Diarrhea, unspecified: Secondary | ICD-10-CM | POA: Insufficient documentation

## 2018-02-16 DIAGNOSIS — Z1509 Genetic susceptibility to other malignant neoplasm: Secondary | ICD-10-CM | POA: Diagnosis not present

## 2018-02-16 LAB — COMPREHENSIVE METABOLIC PANEL
ALBUMIN: 2.7 g/dL — AB (ref 3.5–5.0)
ALT: 14 U/L (ref 0–55)
ANION GAP: 5 (ref 3–11)
AST: 19 U/L (ref 5–34)
Alkaline Phosphatase: 44 U/L (ref 40–150)
BILIRUBIN TOTAL: 0.4 mg/dL (ref 0.2–1.2)
BUN: 5 mg/dL — ABNORMAL LOW (ref 7–26)
CO2: 26 mmol/L (ref 22–29)
Calcium: 8.2 mg/dL — ABNORMAL LOW (ref 8.4–10.4)
Chloride: 110 mmol/L — ABNORMAL HIGH (ref 98–109)
Creatinine, Ser: 0.86 mg/dL (ref 0.70–1.30)
GFR calc non Af Amer: >60 mL/min (ref >60)
GLUCOSE: 71 mg/dL (ref 70–140)
POTASSIUM: 4 mmol/L (ref 3.5–5.1)
Sodium: 141 mmol/L (ref 136–145)
Total Protein: 5 g/dL — ABNORMAL LOW (ref 6.4–8.3)

## 2018-02-16 LAB — CBC WITH DIFFERENTIAL/PLATELET
BASOS ABS: 0.1 10*3/uL (ref 0.0–0.1)
Basophils Relative: 1 %
Eosinophils Absolute: 0.1 10*3/uL (ref 0.0–0.5)
Eosinophils Relative: 2 %
HEMATOCRIT: 48.7 % (ref 38.4–49.9)
HEMOGLOBIN: 16.2 g/dL (ref 13.0–17.1)
LYMPHS PCT: 10 %
Lymphs Abs: 0.7 10*3/uL — ABNORMAL LOW (ref 0.9–3.3)
MCH: 29 pg (ref 27.2–33.4)
MCHC: 33.2 g/dL (ref 32.0–36.0)
MCV: 87.2 fL (ref 79.3–98.0)
MONO ABS: 0.4 10*3/uL (ref 0.1–0.9)
Monocytes Relative: 6 %
NEUTROS ABS: 6 10*3/uL (ref 1.5–6.5)
NEUTROS PCT: 81 %
Platelets: 185 10*3/uL (ref 140–400)
RBC: 5.59 MIL/uL (ref 4.20–5.82)
RDW: 15.7 % — ABNORMAL HIGH (ref 11.0–14.6)
WBC: 7.4 10*3/uL (ref 4.0–10.3)

## 2018-02-16 LAB — CEA (IN HOUSE-CHCC): CEA (CHCC-In House): <1 ng/mL (ref 0.00–5.00)

## 2018-02-16 MED ORDER — HEPARIN SOD (PORK) LOCK FLUSH 100 UNIT/ML IV SOLN
500.0000 [IU] | Freq: Once | INTRAVENOUS | Status: DC | PRN
  Filled 2018-02-16: qty 5

## 2018-02-16 MED ORDER — ALPRAZOLAM 0.5 MG PO TABS: 0.5000 mg | ORAL_TABLET | Freq: Two times a day (BID) | ORAL | 0 refills | Status: DC | PRN

## 2018-02-16 MED ORDER — GABAPENTIN 300 MG PO CAPS: 300.0000 mg | ORAL_CAPSULE | Freq: Two times a day (BID) | ORAL | 3 refills | Status: DC

## 2018-02-16 MED ORDER — SODIUM CHLORIDE 0.9% FLUSH
10.0000 mL | INTRAVENOUS | Status: DC | PRN
  Filled 2018-02-16: qty 10

## 2018-02-16 MED ORDER — OCTREOTIDE ACETATE 20 MG IM KIT
PACK | INTRAMUSCULAR | Status: AC
  Filled 2018-02-16: qty 1

## 2018-02-17 ENCOUNTER — Telehealth: Payer: Self-pay | Admitting: Hematology

## 2018-02-17 NOTE — Telephone Encounter (Signed)
Letter/Calendar mailed to patient with updated appointments per 2/28 sch msg °

## 2018-02-20 ENCOUNTER — Ambulatory Visit: Payer: Medicaid Other | Admitting: Physical Therapy

## 2018-02-28 ENCOUNTER — Ambulatory Visit: Payer: Medicaid Other | Attending: Surgery | Admitting: Physical Therapy

## 2018-02-28 ENCOUNTER — Telehealth: Payer: Self-pay | Admitting: Physical Therapy

## 2018-02-28 NOTE — Telephone Encounter (Signed)
Patient did not show for appointment.  Patient was called and PT left message to please call us back.  Zannie Cove, PT 02/28/18 5:09 PM

## 2018-03-07 ENCOUNTER — Ambulatory Visit: Payer: Medicaid Other | Admitting: Physical Therapy

## 2018-03-08 MED FILL — PAREGORIC LIQUID: 2 | 11 days supply | Qty: 473 | Fill #0

## 2018-03-27 MED FILL — PAREGORIC LIQUID: 2 | 11 days supply | Qty: 473 | Fill #1

## 2018-03-28 ENCOUNTER — Ambulatory Visit: Payer: Self-pay | Admitting: Surgery

## 2018-03-28 NOTE — H&P (Signed)
[name] Documented: 03/20/2018 3:52 PM Location: [facility] Patient #: [id number] DOB: 18-Sep-1971 Married / Language: [name] / Race: White Male   History of Present Illness Adin Hector MD; 03/20/2018 5:53 PM) The patient is a 47 year old male who presents with a complaint of diarrhea. Note for "Diarrhea": ` ` ` The patient is a 47 year old male presenting for a post-operative visit. ` ` The patient returns s/p takedown of loop ileostomy. 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  EUA and loop ileostomy takedown 09/21/2017  Pouchoscopy with coloanal stricture dilation 01/12/2018  The returns to clinic after surgery, again struggling with diarrhea. He brings in his wife today. He still taking fiber. He still taking iron. He did start taking the paregoric. He notes he was taking paregoric up to 4 times a day. That was slowing things down. However he's cut back to 2 times a day. Has some good days now. This month is better than before. However, he still having some very bad days. Sometimes he still will have severe days with having 10 bowel movements in an hour. Over 20 in a day. Can't hold it in. Leaking. Wearing diapers. He has been using the Hegar dilators twice a day. He like he can have larger bowel movements after that. He still very raw. Using A&D ointment for painful rash. No nausea or vomiting. Patient & wife wondering if he can get an ileostomy now.   ` ` 09/22/2017  3:02 PM  PATIENT: [name] 47 y.o. 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  POST-OPERATIVE DIAGNOSIS: 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  PROCEDURE:  TAKEDOWN OF LOOP ILEOSTOMY  SURGEON: Adin Hector, MD  ASSISTANT: Quincy Sheehan, [name], [employer]   ANESTHESIA: local and general  EBL: Total I/O In: - Out: 25 [Blood:25]  Delay start of Pharmacological VTE agent (>24hrs) due to surgical blood loss or risk of bleeding: no  DRAINS: none  SPECIMEN: Source of Specimen: LOOP ILEOSTOMY  DISPOSITION OF SPECIMEN: PATHOLOGY  COUNTS: YES  PLAN OF CARE: Admit to inpatient  PATIENT DISPOSITION: PACU - hemodynamically stable.  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 Completed chemotherapy and is interested in ileostomy takedown. Contrast enema and pouchoscopy negative for any leak or abnormalities. He wished to proceed. The patient has recovered from that surgery with the anastomosis well-healed. It was felt safe to have the loop ileostomy taken down. I discussed the procedure with the patient:  The anatomy & physiology of the digestive tract was discussed. The pathophysiology was discussed. Possibility of remaining with an ostomy permanently was discussed. I offered ostomy takedown. Laparoscopic & open techniques were discussed.  Risks such as bleeding, infection, abscess, leak, reoperation, possible re-ostomy, injury to other organs, hernia, heart attack, death, and other risks were discussed. I noted a good likelihood this will help address the problem. Goals of post-operative recovery were discussed as well. We will work  to minimize complications. Questions were answered. The  patient expresses understanding & wishes to proceed with surgery.  OR FINDINGS: Normal anatomy.  DESCRIPTION:  Informed consent was confirmed. The patient received IV antibiotics & underwent general anesthesia without any difficulty. Foley catheter was sterilely placed. SCDs were active during the entire case. The abdomen was prepped and draped in a sterile fashion. A surgical timeout confirmed our plan.  I made a biconcave curvilinear incision transversely around the loop ileostomy. I got into the subcutaneous tissues. I used careful focused right angle dissection and sharp dissection. Some focused cautery dissection as well. That helped to free adhesions to the subcutaneous tisses & fascia. Gradually, I was able to enter into the peritoneum focally. I did a gentle finger sweep. Gradually came around circumferentially and freed the loop of ileum from remaining adhesions to the abdominal wall. We were able eviscerate some bowel proximally and distally.   I did a side-to-side stapled anastomosis using a 75 GIA. Silk stitch placed at the crotch of the anastomosis. I used a TX stapler to staple off the common defect and resect the remaining ileum, most of it that involve the intra-abdominal ostomy component given the adhesions. . Took the mesentery with clamps and silk ties. I closed the mesenteric defect using interrupted silk stitches. Used the mesentery to help cover and protect the TX staple line. The anastomosis looked healthy and viable. We returned the anastomosis into the abdominal cavity. Finger sweep circumferentially noted no adhesions. It rested well.  We changed gloves and instruments. Irrigated copiously into the wound and fascia. I reapproximated the fascia transversely using #1 PDS stitches. I irrigated into the subcutaneous tissues. I did to a layer of interrupted 2-0 Vicryl deep dermal sutures to help close the wound and dead space down. I reapproximated the  skin using 4-0 Monocryl stitch running centrally. I excised the ends of the closure to remove dog-earing. I left the corners open. I packed the corners with anitbiotic soaked umbilical tape for wicks. Sterile dressing was applied.  Patient was extubated and is stable in the recovery room. I discussed operative findings, updated the patient's status, discussed probable steps to recovery, and gave postoperative recommendations to the patient's significant other. Recommendations were made. Questions were answered. She expressed understanding & appreciation.   Adin Hector, M.D., F.A.C.S. Gastrointestinal and Minimally Invasive Surgery [facility] [address], [place], [place] [address] [phone] Main / Paging   Medication History Lars Mage [place], [place]; 03/20/2018 3:52 PM) Medications Reconciled  Vitals (Alisha Spillers CMA; 03/20/2018 3:52 PM) 03/20/2018 3:52 PM Weight: 153 lb Height: 72in Body Surface Area: 1.9 m Body Mass Index: 20.75 kg/m  BP: 132/80 (Sitting, Left Arm, Standard)       Physical Exam Adin Hector MD; 03/20/2018 4:29 PM) General Mental Status-Alert. General Appearance-Not in acute distress. Voice-Normal. Note: Relaxed. Nontoxic. Moving and walking around more easily. Does not look fatigued nor sickly   Integumentary Global Assessment Upon inspection and palpation of skin surfaces of the - Distribution of scalp and body hair is normal. General Characteristics Overall examination of the patient's skin reveals - no rashes and no suspicious lesions.  Head and Neck Head-normocephalic, atraumatic with no lesions or palpable masses. Face Global Assessment - atraumatic, no absence of expression. Neck Global Assessment - no abnormal movements, no decreased range of motion. Trachea-midline. Thyroid Gland Characteristics - non-tender.  Eye Eyeball - Left-Extraocular movements intact, No  Nystagmus. Eyeball - Right-Extraocular movements intact, No  Nystagmus. Upper Eyelid - Left-No Cyanotic. Upper Eyelid - Right-No Cyanotic.  Chest and Lung Exam Inspection Accessory muscles - No use of accessory muscles in breathing.  Abdomen Note: Incision R side closed. No hernia. No active bleeding. No cellulitis. No guarding/rebound tenderness   Rectal Note: Significant perineal perianal pruritus rash. Worse. Intact sphincter tone. Staple line 15 mm from anal verge posterior midline. Not really strictured. Probably 15 mm diameter. The best it's been. Can tolerate digital exam. Sensitive but easily allows finger   Peripheral Vascular Upper Extremity Inspection - Left - Not Gangrenous, No Petechiae. Right - Not Gangrenous, No Petechiae.  Neurologic Neurologic evaluation reveals -normal attention span and ability to concentrate, able to name objects and repeat phrases. Appropriate fund of knowledge and normal coordination.  Neuropsychiatric Mental status exam performed with findings of-able to articulate well with normal speech/language, rate, volume and coherence and no evidence of hallucinations, delusions, obsessions or homicidal/suicidal ideation. Orientation-oriented X3.  Musculoskeletal Global Assessment Gait and Station - normal gait and station.  Lymphatic General Lymphatics Description - No Generalized lymphadenopathy.    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 Improved with dilation of coloanal stricture and paregoric/fiber regimen.  History of intermittent compliance in the past. He's got much better about it. Cut back on his smoking, using the Hegar dilators twice a day, staying on fiber & Paregoric for the most part. It is hard to tease out if he truly is better. I think for the most part he is at least having some good days now. His  wife agrees. However the bad days arestill horrible  I think he tried to self-wean off the paregoric and it backfired. I strongly recommend he get back up to 4 doses a day where both he and his wife agreed he was getting better. He can gradually increase the dosing until <8BMs/day.  If his diarrhea is controlled, stricture remains absent, no longer needs to be wear pads or diapers and pruritus rash goes away; then, hold off on pouch excision and ileostomy.  If not, excision of ileoanal pouch, oversew sphincters, omental flap coverage of pelvic floor, and permanent ileostomy. d/w Drs Dema Severin & Marcello Moores. It is past the 6 month period, over a year since the pouch creation, so adhesions wound be as minimal as possible. However, this could be quite prolonged case. Would probably do transanal approach to get through pouch and closedown sphincters. Then robotically assisted pouch excision and ileostomy creation. While we were getting close to running out of options, it is not totally exhausted yet. I think the patient wife feel validated that the option is on the table at least so they agreed to increase the paregotic. I told him not to wean himself off it for several months. Wait until the rash is gone, episodes of accidents and incontinence are minimal before even considering backing off of the paregoric. I cautioned he may need to be on something permanently.  Quit smoking! I congratulated him pain down to just a few cigarettes a day, but cautioned that Current Plans Pt Education - [facility] Free Text Education/Instructions: discussed with patient and provided information. Changed Paregoric 2 MG/5ML Oral Tincture, 10 Milliliter four times daily, as needed, 500 Milliliter, 03/20/2018, Ref. x5. 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 Improved after digital dilation. Pouchoscopy reassuring of no major issues.  I'm hopeful that because his urgency and diarrhea is less frequently problem that were on the right track.  Continue fiber twice a day and paregoric to help slow down bowels. Less than 8 bowel movements a day.  Anal stricture much less. Continue Hegar dilators for at least the next month to keep it from stricturing back down. HX CANCER OF COLORECTAL REGION - F/U PRN (Z85.048) Impression: No obvious local recurrence. Per Dr. Burr Medico HNPCC5 (Z15.09) Impression: Cancer risks from Lynch syndrome, status post total proctoscopy colectomy with ileal J-pouch anastomosis.  Med Onc (Dr Burr Medico) previously recommended to him to have EGD every 3-5 years, annual urinalysis for GU cancer screening.   Addendum: Patient with persistent diarrhea despite high-dose paregoric and other regimens.  Request is ileostomy takedown.  Discuss with coorectal partners.  We'll proceed.  Patient understands this is a permanent ileostomy and recuperation a pouch not possible in the future  Adin Hector, M.D., F.A.C.S. Gastrointestinal and Minimally Invasive Surgery [facility] [address] [place], [place] [address] [phone] Main / Paging

## 2018-03-29 ENCOUNTER — Emergency Department (HOSPITAL_COMMUNITY): Payer: Medicaid Other

## 2018-03-29 ENCOUNTER — Observation Stay (HOSPITAL_COMMUNITY)
Admission: EM | Admit: 2018-03-29 | Discharge: 2018-03-30 | Disposition: A | Discharge status: Home / Self Care | Payer: Medicaid Other | Attending: Student in an Organized Health Care Education/Training Program | Admitting: Student in an Organized Health Care Education/Training Program

## 2018-03-29 ENCOUNTER — Observation Stay (HOSPITAL_COMMUNITY): Payer: Medicaid Other

## 2018-03-29 ENCOUNTER — Other Ambulatory Visit: Payer: Self-pay

## 2018-03-29 ENCOUNTER — Encounter (HOSPITAL_COMMUNITY): Payer: Self-pay | Admitting: Emergency Medicine

## 2018-03-29 DIAGNOSIS — K624 Stenosis of anus and rectum: Secondary | ICD-10-CM | POA: Diagnosis present

## 2018-03-29 DIAGNOSIS — R918 Other nonspecific abnormal finding of lung field: Secondary | ICD-10-CM | POA: Insufficient documentation

## 2018-03-29 DIAGNOSIS — R251 Tremor, unspecified: Secondary | ICD-10-CM

## 2018-03-29 DIAGNOSIS — I4581 Long QT syndrome: Secondary | ICD-10-CM | POA: Insufficient documentation

## 2018-03-29 DIAGNOSIS — E1165 Type 2 diabetes mellitus with hyperglycemia: Secondary | ICD-10-CM | POA: Insufficient documentation

## 2018-03-29 DIAGNOSIS — Z720 Tobacco use: Secondary | ICD-10-CM

## 2018-03-29 DIAGNOSIS — Z833 Family history of diabetes mellitus: Secondary | ICD-10-CM | POA: Diagnosis not present

## 2018-03-29 DIAGNOSIS — G629 Polyneuropathy, unspecified: Secondary | ICD-10-CM

## 2018-03-29 DIAGNOSIS — Z85048 Personal history of other malignant neoplasm of rectum, rectosigmoid junction, and anus: Secondary | ICD-10-CM | POA: Diagnosis not present

## 2018-03-29 DIAGNOSIS — R739 Hyperglycemia, unspecified: Secondary | ICD-10-CM | POA: Insufficient documentation

## 2018-03-29 DIAGNOSIS — Z79899 Other long term (current) drug therapy: Secondary | ICD-10-CM | POA: Diagnosis not present

## 2018-03-29 DIAGNOSIS — E876 Hypokalemia: Secondary | ICD-10-CM | POA: Insufficient documentation

## 2018-03-29 DIAGNOSIS — Z800 Family history of malignant neoplasm of digestive organs: Secondary | ICD-10-CM | POA: Diagnosis not present

## 2018-03-29 DIAGNOSIS — E440 Moderate protein-calorie malnutrition: Secondary | ICD-10-CM

## 2018-03-29 DIAGNOSIS — R4182 Altered mental status, unspecified: Secondary | ICD-10-CM

## 2018-03-29 DIAGNOSIS — F419 Anxiety disorder, unspecified: Secondary | ICD-10-CM | POA: Insufficient documentation

## 2018-03-29 DIAGNOSIS — A0472 Enterocolitis due to Clostridium difficile, not specified as recurrent: Secondary | ICD-10-CM | POA: Diagnosis not present

## 2018-03-29 DIAGNOSIS — Z6821 Body mass index (BMI) 21.0-21.9, adult: Secondary | ICD-10-CM | POA: Insufficient documentation

## 2018-03-29 DIAGNOSIS — K91858 Other complications of intestinal pouch: Secondary | ICD-10-CM | POA: Diagnosis present

## 2018-03-29 DIAGNOSIS — Z1509 Genetic susceptibility to other malignant neoplasm: Secondary | ICD-10-CM | POA: Diagnosis not present

## 2018-03-29 DIAGNOSIS — Z66 Do not resuscitate: Secondary | ICD-10-CM | POA: Diagnosis not present

## 2018-03-29 DIAGNOSIS — R55 Syncope and collapse: Principal | ICD-10-CM

## 2018-03-29 DIAGNOSIS — F17210 Nicotine dependence, cigarettes, uncomplicated: Secondary | ICD-10-CM | POA: Diagnosis not present

## 2018-03-29 DIAGNOSIS — C20 Malignant neoplasm of rectum: Secondary | ICD-10-CM | POA: Diagnosis present

## 2018-03-29 DIAGNOSIS — Z85030 Personal history of malignant carcinoid tumor of large intestine: Secondary | ICD-10-CM | POA: Diagnosis not present

## 2018-03-29 DIAGNOSIS — Z9049 Acquired absence of other specified parts of digestive tract: Secondary | ICD-10-CM | POA: Diagnosis not present

## 2018-03-29 DIAGNOSIS — R5383 Other fatigue: Secondary | ICD-10-CM | POA: Diagnosis not present

## 2018-03-29 DIAGNOSIS — R197 Diarrhea, unspecified: Secondary | ICD-10-CM | POA: Diagnosis not present

## 2018-03-29 DIAGNOSIS — F411 Generalized anxiety disorder: Secondary | ICD-10-CM | POA: Diagnosis present

## 2018-03-29 DIAGNOSIS — K529 Noninfective gastroenteritis and colitis, unspecified: Secondary | ICD-10-CM | POA: Diagnosis not present

## 2018-03-29 DIAGNOSIS — Z932 Ileostomy status: Secondary | ICD-10-CM

## 2018-03-29 HISTORY — DX: Depression, unspecified: F32.A

## 2018-03-29 HISTORY — DX: Malignant neoplasm of colon, unspecified: C18.9

## 2018-03-29 HISTORY — DX: Major depressive disorder, single episode, unspecified: F32.9

## 2018-03-29 LAB — URINALYSIS, ROUTINE W REFLEX MICROSCOPIC
BACTERIA UA: NONE SEEN
BILIRUBIN URINE: NEGATIVE
Glucose, UA: >=500 mg/dL — AB
Hgb urine dipstick: NEGATIVE
KETONES UR: 5 mg/dL — AB
LEUKOCYTES UA: NEGATIVE
Nitrite: NEGATIVE
PH: 6 (ref 5.0–8.0)
Protein, ur: NEGATIVE mg/dL
SQUAMOUS EPITHELIAL / LPF: NONE SEEN
Specific Gravity, Urine: 1.02 (ref 1.005–1.030)

## 2018-03-29 LAB — BASIC METABOLIC PANEL
Anion gap: 12 (ref 5–15)
CHLORIDE: 105 mmol/L (ref 101–111)
CO2: 21 mmol/L — AB (ref 22–32)
CREATININE: 0.98 mg/dL (ref 0.61–1.24)
Calcium: 8.1 mg/dL — ABNORMAL LOW (ref 8.9–10.3)
GFR calc Af Amer: >60 mL/min (ref >60)
GFR calc non Af Amer: >60 mL/min (ref >60)
Glucose, Bld: 256 mg/dL — ABNORMAL HIGH (ref 65–99)
Potassium: 3.4 mmol/L — ABNORMAL LOW (ref 3.5–5.1)
Sodium: 138 mmol/L (ref 135–145)

## 2018-03-29 LAB — CBC
HCT: 48.5 % (ref 39.0–52.0)
Hemoglobin: 16.1 g/dL (ref 13.0–17.0)
MCH: 29.5 pg (ref 26.0–34.0)
MCHC: 33.2 g/dL (ref 30.0–36.0)
MCV: 88.8 fL (ref 78.0–100.0)
PLATELETS: 189 10*3/uL (ref 150–400)
RBC: 5.46 MIL/uL (ref 4.22–5.81)
RDW: 15.1 % (ref 11.5–15.5)
WBC: 9.4 10*3/uL (ref 4.0–10.5)

## 2018-03-29 LAB — DIFFERENTIAL
BASOS PCT: 0 %
Basophils Absolute: 0 10*3/uL (ref 0.0–0.1)
EOS ABS: 0.2 10*3/uL (ref 0.0–0.7)
EOS PCT: 2 %
LYMPHS ABS: 0.9 10*3/uL (ref 0.7–4.0)
Lymphocytes Relative: 9 %
MONO ABS: 0.4 10*3/uL (ref 0.1–1.0)
MONOS PCT: 4 %
Neutro Abs: 8.5 10*3/uL — ABNORMAL HIGH (ref 1.7–7.7)
Neutrophils Relative %: 85 %

## 2018-03-29 LAB — I-STAT CG4 LACTIC ACID, ED
LACTIC ACID, VENOUS: 3.79 mmol/L — AB (ref 0.5–1.9)
Lactic Acid, Venous: 1.32 mmol/L (ref 0.5–1.9)

## 2018-03-29 LAB — GLUCOSE, CAPILLARY
GLUCOSE-CAPILLARY: 115 mg/dL — AB (ref 65–99)
GLUCOSE-CAPILLARY: 96 mg/dL (ref 65–99)

## 2018-03-29 LAB — HEPATIC FUNCTION PANEL
ALK PHOS: 46 U/L (ref 38–126)
ALT: 29 U/L (ref 17–63)
AST: 25 U/L (ref 15–41)
Albumin: 2.4 g/dL — ABNORMAL LOW (ref 3.5–5.0)
BILIRUBIN DIRECT: 0.1 mg/dL (ref 0.1–0.5)
BILIRUBIN INDIRECT: 0.3 mg/dL (ref 0.3–0.9)
BILIRUBIN TOTAL: 0.4 mg/dL (ref 0.3–1.2)
Total Protein: 4.6 g/dL — ABNORMAL LOW (ref 6.5–8.1)

## 2018-03-29 LAB — CBG MONITORING, ED
GLUCOSE-CAPILLARY: 209 mg/dL — AB (ref 65–99)
GLUCOSE-CAPILLARY: 147 mg/dL — AB (ref 65–99)
Glucose-Capillary: 106 mg/dL — ABNORMAL HIGH (ref 65–99)

## 2018-03-29 LAB — HEMOGLOBIN A1C
HEMOGLOBIN A1C: 5.5 % (ref 4.8–5.6)
MEAN PLASMA GLUCOSE: 111.15 mg/dL

## 2018-03-29 LAB — PREALBUMIN: PREALBUMIN: 33.4 mg/dL (ref 18–38)

## 2018-03-29 LAB — LIPASE, BLOOD: Lipase: 22 U/L (ref 11–51)

## 2018-03-29 LAB — PHOSPHORUS: PHOSPHORUS: 3.5 mg/dL (ref 2.5–4.6)

## 2018-03-29 LAB — MAGNESIUM: Magnesium: 2.1 mg/dL (ref 1.7–2.4)

## 2018-03-29 LAB — TROPONIN I: Troponin I: <0.03 ng/mL (ref <0.03)

## 2018-03-29 LAB — CK: Total CK: 55 U/L (ref 49–397)

## 2018-03-29 MED ORDER — NICOTINE 14 MG/24HR TD PT24
14.0000 mg | MEDICATED_PATCH | Freq: Every day | TRANSDERMAL | Status: DC
  Administered 2018-03-29 – 2018-03-30 (×2): 14 mg via TRANSDERMAL
  Filled 2018-03-29 (×2): qty 1

## 2018-03-29 MED ORDER — ENSURE SURGERY PO LIQD
237.0000 mL | Freq: Two times a day (BID) | ORAL | Status: DC
  Administered 2018-03-29 – 2018-03-30 (×2): 237 mL via ORAL
  Filled 2018-03-29 (×5): qty 237

## 2018-03-29 MED ORDER — PAREGORIC 2 MG/5ML PO TINC
10.0000 mL | Freq: Three times a day (TID) | ORAL | Status: DC
  Administered 2018-03-29 – 2018-03-30 (×3): 10 mL via ORAL
  Filled 2018-03-29 (×3): qty 10

## 2018-03-29 MED ORDER — DIPHENOXYLATE-ATROPINE 2.5-0.025 MG PO TABS
1.0000 | ORAL_TABLET | Freq: Four times a day (QID) | ORAL | Status: DC | PRN
  Administered 2018-03-29: 2 via ORAL
  Filled 2018-03-29: qty 2

## 2018-03-29 MED ORDER — LACTATED RINGERS IV BOLUS: 1000.0000 mL | Freq: Three times a day (TID) | INTRAVENOUS | Status: DC | PRN

## 2018-03-29 MED ORDER — HYDROCORTISONE 2.5 % RE CREA
1.0000 "application " | TOPICAL_CREAM | Freq: Four times a day (QID) | RECTAL | Status: DC | PRN
  Filled 2018-03-29: qty 28.35

## 2018-03-29 MED ORDER — PHENOL 1.4 % MT LIQD
1.0000 | OROMUCOSAL | Status: DC | PRN
  Filled 2018-03-29: qty 177

## 2018-03-29 MED ORDER — HYDROCORTISONE 1 % EX CREA
1.0000 "application " | TOPICAL_CREAM | Freq: Three times a day (TID) | CUTANEOUS | Status: DC | PRN
  Filled 2018-03-29: qty 28

## 2018-03-29 MED ORDER — PAREGORIC 2 MG/5ML PO TINC
5.0000 mL | Freq: Four times a day (QID) | ORAL | Status: DC | PRN
  Administered 2018-03-29: 10 mL via ORAL

## 2018-03-29 MED ORDER — ALPRAZOLAM 0.5 MG PO TABS
0.5000 mg | ORAL_TABLET | Freq: Two times a day (BID) | ORAL | Status: DC | PRN
  Administered 2018-03-29: 0.5 mg via ORAL
  Filled 2018-03-29: qty 1

## 2018-03-29 MED ORDER — WITCH HAZEL-GLYCERIN EX PADS
1.0000 "application " | MEDICATED_PAD | CUTANEOUS | Status: DC | PRN
  Filled 2018-03-29: qty 100

## 2018-03-29 MED ORDER — PSYLLIUM 95 % PO PACK
1.0000 | PACK | Freq: Two times a day (BID) | ORAL | Status: DC
  Administered 2018-03-29 – 2018-03-30 (×3): 1 via ORAL
  Filled 2018-03-29 (×4): qty 1

## 2018-03-29 MED ORDER — GUAIFENESIN-DM 100-10 MG/5ML PO SYRP: 10.0000 mL | ORAL_SOLUTION | ORAL | Status: DC | PRN

## 2018-03-29 MED ORDER — BLISTEX MEDICATED EX OINT
TOPICAL_OINTMENT | CUTANEOUS | Status: DC | PRN
  Filled 2018-03-29: qty 6.3

## 2018-03-29 MED ORDER — GABAPENTIN 300 MG PO CAPS
300.0000 mg | ORAL_CAPSULE | Freq: Two times a day (BID) | ORAL | Status: DC
  Administered 2018-03-29 – 2018-03-30 (×3): 300 mg via ORAL
  Filled 2018-03-29 (×3): qty 1

## 2018-03-29 MED ORDER — INSULIN ASPART 100 UNIT/ML ~~LOC~~ SOLN: 0.0000 [IU] | Freq: Every day | SUBCUTANEOUS | Status: DC

## 2018-03-29 MED ORDER — POTASSIUM CHLORIDE IN NACL 40-0.9 MEQ/L-% IV SOLN
INTRAVENOUS | Status: AC
  Administered 2018-03-29 – 2018-03-30 (×3): 125 mL/h via INTRAVENOUS
  Filled 2018-03-29 (×3): qty 1000

## 2018-03-29 MED ORDER — ZINC OXIDE 40 % EX OINT
TOPICAL_OINTMENT | Freq: Two times a day (BID) | CUTANEOUS | Status: DC
  Administered 2018-03-29 – 2018-03-30 (×3): via TOPICAL
  Filled 2018-03-29 (×2): qty 114

## 2018-03-29 MED ORDER — ALUM & MAG HYDROXIDE-SIMETH 200-200-20 MG/5ML PO SUSP: 30.0000 mL | Freq: Four times a day (QID) | ORAL | Status: DC | PRN

## 2018-03-29 MED ORDER — ENSURE ENLIVE PO LIQD: 237.0000 mL | Freq: Two times a day (BID) | ORAL | Status: DC

## 2018-03-29 MED ORDER — HYDROCORTISONE ACE-PRAMOXINE 2.5-1 % RE CREA
1.0000 "application " | TOPICAL_CREAM | Freq: Four times a day (QID) | RECTAL | Status: DC | PRN
  Filled 2018-03-29: qty 30

## 2018-03-29 MED ORDER — DIPHENHYDRAMINE HCL 50 MG/ML IJ SOLN: 12.5000 mg | Freq: Four times a day (QID) | INTRAMUSCULAR | Status: DC | PRN

## 2018-03-29 MED ORDER — PNEUMOCOCCAL VAC POLYVALENT 25 MCG/0.5ML IJ INJ
0.5000 mL | INJECTION | INTRAMUSCULAR | Status: DC
  Filled 2018-03-29: qty 0.5

## 2018-03-29 MED ORDER — MENTHOL 3 MG MT LOZG
1.0000 | LOZENGE | OROMUCOSAL | Status: DC | PRN
  Filled 2018-03-29: qty 9

## 2018-03-29 MED ORDER — LIP MEDEX EX OINT
1.0000 "application " | TOPICAL_OINTMENT | Freq: Two times a day (BID) | CUTANEOUS | Status: DC
  Filled 2018-03-29: qty 7

## 2018-03-29 MED ORDER — ACETAMINOPHEN 325 MG PO TABS
650.0000 mg | ORAL_TABLET | Freq: Four times a day (QID) | ORAL | Status: DC | PRN
  Administered 2018-03-29 – 2018-03-30 (×2): 650 mg via ORAL
  Filled 2018-03-29 (×2): qty 2

## 2018-03-29 MED ORDER — ENOXAPARIN SODIUM 40 MG/0.4ML ~~LOC~~ SOLN
40.0000 mg | SUBCUTANEOUS | Status: DC
  Administered 2018-03-29 – 2018-03-30 (×2): 40 mg via SUBCUTANEOUS
  Filled 2018-03-29 (×2): qty 0.4

## 2018-03-29 MED ORDER — FERROUS SULFATE 325 (65 FE) MG PO TABS
325.0000 mg | ORAL_TABLET | Freq: Two times a day (BID) | ORAL | Status: DC
  Administered 2018-03-29 – 2018-03-30 (×2): 325 mg via ORAL
  Filled 2018-03-29 (×2): qty 1

## 2018-03-29 MED ORDER — MAGIC MOUTHWASH
15.0000 mL | Freq: Four times a day (QID) | ORAL | Status: DC | PRN
  Filled 2018-03-29: qty 15

## 2018-03-29 MED ORDER — PAREGORIC 2 MG/5ML PO TINC: 5.0000 mL | Freq: Four times a day (QID) | ORAL | Status: DC | PRN

## 2018-03-29 MED ORDER — INSULIN ASPART 100 UNIT/ML ~~LOC~~ SOLN
0.0000 [IU] | Freq: Three times a day (TID) | SUBCUTANEOUS | Status: DC
  Administered 2018-03-29: 2 [IU] via SUBCUTANEOUS
  Filled 2018-03-29: qty 1

## 2018-03-29 MED ORDER — LACTATED RINGERS IV BOLUS: 1000.0000 mL | Freq: Once | INTRAVENOUS | Status: DC

## 2018-03-29 MED ORDER — SODIUM CHLORIDE 0.9 % IV BOLUS
1000.0000 mL | Freq: Once | INTRAVENOUS | Status: AC
  Administered 2018-03-29: 1000 mL via INTRAVENOUS

## 2018-03-29 MED ORDER — GADOBENATE DIMEGLUMINE 529 MG/ML IV SOLN
15.0000 mL | Freq: Once | INTRAVENOUS | Status: AC
  Administered 2018-03-29: 15 mL via INTRAVENOUS

## 2018-03-29 MED ORDER — DIPHENHYDRAMINE HCL 25 MG PO CAPS: 25.0000 mg | ORAL_CAPSULE | Freq: Four times a day (QID) | ORAL | Status: DC | PRN

## 2018-03-29 NOTE — ED Notes (Signed)
Pt ambulated to restroom for 3rd episode of diarr

## 2018-03-29 NOTE — Consult Note (Signed)
[facility] Nurse wound consult note Reason for Consult:Perianal skin impairment related to chronic diarrhea I spoke with the ED RN for the patient.  She stated the medical team had ordered 40% Desitin for the patient for the treatment of the affected area.  I explained that this was a better choice when compared to anything our nursing staff has available in the supply area, and to use this.  She said she will contact the pharmacy and get them to send it to her. Thank you for the consult.  Discussed plan of care with the bedside nurse.  [facility] nurse will not follow at this time.  Please re-consult the [facility] team if needed.  Val Riles, RN, MSN, CWOCN, CNS-BC, pager [phone]

## 2018-03-29 NOTE — Progress Notes (Addendum)
[name]  11-Oct-1948 [id number]  Patient Care Team: Patient, No Pcp Per as PCP - General (General Practice) Michael Boston, MD as Consulting Physician (General Surgery) [name], Kirke Corin, MD as Consulting Physician (Gastroenterology) Kyung Rudd, MD as Consulting Physician (Radiation Oncology) Truitt Merle, MD as Consulting Physician (Oncology)  Patient very familiar to me.  History of rectal cancer in the setting of Lynch syndrome and numerous polyps underwent total proctocolectomy and ileoanal J-pouch and diverting loop ileostomy after neoadjuvant chemoradiation therapy.  Post adjuvant chemotherapy as well.  No strong evidence of major recurrence.  Underwent ileostomy takedown 6 months ago.  Has struggled with diarrhea and overflow incontinence.  Biopsies, workup, and stool studies negative for infection nor pouchitis.  Refractory to antibiotics.  Refractory to Imodium and Lomotil.  Transition to paregoric.  That almost always works.  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  Sphincter tone intact.  Has undergone dilations.  Has been doing home dilations.  Try to work with pelvic floor physical therapy to help strengthen and get biofeedback set up.  Stricture resolved.  Challenge with questionable compliance with the paregoric to thicken up stools with fiber but otherwise has been working very hard and being compliant with recommendations and interventions.  Felt by [place] colorectal surgeons and certainly the patient and wife that he has reached a point of futility.  Still struggling.  Plan was to do resection of ileoanal J-pouch with creation of permanent ileostomy since he seemed to have less trouble with the ileostomy.  Scheduling in process.  I would not go to emergent resection.  Would like him to stabilize this admission.  Plan resection at [facility] so this can be done MIS/robotically given the anticipated severe adhesions from his  pelvic radiation, low anterior resection, ileoanal J-pouch creation.  Will be a long case.  Less morbid if can be done minimally invasively/robotically as opposed to a large open incision.  Complex colorectal surgery done at [facility].  Robotic resection option not available at [facility] where he chose to go.  Would get stool studies to make sure there is no bacterial overinfection of the ileoanal J-pouch to explain his struggles with severe diarrhea.  Rule out C. Difficile toxicity.  Not too likely, but may be reasonable to check while we have him monitored and can get samples and studies.  Strict I&O to see what truly is his diarrhea.  Desitin for the perineal rash - he has been using A&D ointment at home - sometimes he's told me it helps & other times he is burning & miserable.  Get [facility]OCN to see if they have any advice.  Glucose above 200 = diabetes.  Needs sliding scale & workup.  MRI negative reassuring.  No major deficits now per neurology guardedly reassuring as well.  ?Wonder if patient had panic attack with his anxiety issues, but certainly observation & syncopal/seizure w/u  etc. Needed.  Do not have any good data about overdose with paregoric since its only functioning regionally and should not have systemic effects, but I doubt that this is a toxicity or overdose from the paregoric.  He has been underusing.  Hypokalemia.  Defer to primary service but consider correcting.  Check magnesium as well.  No evidence of any renal failure with normal creatinine despite his malnutrition and severe diarrhea struggles.  Check orthostatics.  Aggressive IV fluid resuscitation.   Fair albumin concerning for chronic malnutrition.  Consider calorie counts,  supplemental shakes, nutritional consultation.  Encourage smoking sensation.  He is made an effort to cut down markedly but cannot entirely quit.  Agree with nicotine patch.  I will follow peripherally.  Adin Hector, M.D.,  F.A.C.S. Gastrointestinal and Minimally Invasive Surgery [facility] [address], [place], [place] [address] [phone] Main / Paging    Patient Active Problem List   Diagnosis Date Noted  . 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 10/13/2016    Priority: Medium  . Rectal adenocarcinoma s/p protctocolectomy, IPAA "J" pouch 12/01/2016 08/06/2016    Priority: Medium  . Altered mental status 03/29/2018  . DM (diabetes mellitus) ([place]) 03/29/2018  . Tobacco abuse 03/29/2018  . Hypokalemia 03/29/2018  . Protein-calorie malnutrition, moderate ([place]) 03/29/2018  . Stricture of ileoanal anastomosis s/p dilitations 01/12/2018  . Muscle spasm 04/11/2017  . Encounter for antineoplastic chemotherapy 04/11/2017  . Hypomagnesemia 12/02/2016  . 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 Family history of colon cancer   . Anxiety 09/14/2016    Past Medical History:  Diagnosis Date  . Anxiety   . Cancer ([place]) 12/01/2016   colon   . Family history of colon cancer   . Poor dental hygiene     Past Surgical History:  Procedure Laterality Date  . EUS N/A 07/29/2016   Procedure: LOWER ENDOSCOPIC ULTRASOUND (EUS);  Surgeon: Milus Banister, MD;  Location: [facility];  Service: Endoscopy;  Laterality: N/A;  . ILEOSTOMY CLOSURE N/A 09/22/2017   Procedure: TAKEDOWN OF LOOP ILEOSTOMY;  Surgeon: Michael Boston, MD;  Location: [facility];  Service: General;  Laterality: N/A;  . PORTACATH PLACEMENT N/A 01/26/2017   Procedure: PLACEMENT OF PORT-A-CATH CENTRAL LINE WITH FLUOROSCOPY AND ULTRASOUND;  Surgeon: Michael Boston, MD;  Location: [facility];  Service: General;  Laterality: N/A;  . POUCHOSCOPY N/A 09/21/2017   Procedure: POUCHOSCOPY;  Surgeon: Leighton Ruff, MD;   Location: [facility];  Service: Endoscopy;  Laterality: N/A;  . POUCHOSCOPY N/A 01/12/2018   Procedure: POUCHOSCOPY WITH BIOPSIES;  Surgeon: Leighton Ruff, MD;  Location: [facility];  Service: Endoscopy;  Laterality: N/A;  Local  . PROCTOSCOPY N/A 12/01/2016   Procedure: RIGID PROCTOSCOPY;  Surgeon: Michael Boston, MD;  Location: [facility];  Service: General;  Laterality: N/A;  . TOE AMPUTATION Left   . XI ROBOTIC ASSISTED LOWER ANTERIOR RESECTION N/A 12/01/2016   Procedure: XI ROBOTIC ASSISTED PROCTOCOLECTOMY WITH ILLEOPOUCH ANASTAMOSIS WITH DIVERTING ILLEOSTOMY;  Surgeon: Michael Boston, MD;  Location: [facility];  Service: General;  Laterality: N/A;    Social History   Socioeconomic History  . Marital status: Married    Spouse name: Not on file  . Number of children: 3  . Years of education: Not on file  . Highest education level: Not on file  Occupational History  . Not on file  Social Needs  . Financial resource strain: Not on file  . Food insecurity:    Worry: Not on file    Inability: Not on file  . Transportation needs:    Medical: Not on file    Non-medical: Not on file  Tobacco Use  . Smoking status: Current Every Day Smoker    Packs/day: 1.50    Years: 30.00    Pack years: 45.00    Types: Cigarettes  Last attempt to quit: 12/01/2016    Years since quitting: 1.3  . Smokeless tobacco: Never Used  . Tobacco comment: reduced smoking to 2-3 daily  Substance and Sexual Activity  . Alcohol use: No  . Drug use: No    Comment: patient denies  . Sexual activity: Not on file  Lifestyle  . Physical activity:    Days per week: Not on file    Minutes per session: Not on file  . Stress: Not on file  Relationships  . Social connections:    Talks on phone: Not on file    Gets together: Not on file    Attends religious service: Not on file    Active member of club or organization: Not on file    Attends meetings of clubs or organizations: Not on file    Relationship status:  Not on file  . Intimate partner violence:    Fear of current or ex partner: Not on file    Emotionally abused: Not on file    Physically abused: Not on file    Forced sexual activity: Not on file  Other Topics Concern  . Not on file  Social History Narrative   Lives with significant other, [name]   Have a 42 year old son   Smoker      [place]    Family History  Problem Relation Age of Onset  . Diabetes Mother   . COPD Father   . Colon cancer Maternal Aunt        dx in her 45s  . Diabetes Maternal Grandmother   . Brain cancer Maternal Grandfather   . Lung cancer Paternal Grandfather   . Colon cancer Cousin        dx in her 28s  . Bone cancer Sister 8  . Pancreatic cancer Neg Hx   . Esophageal cancer Neg Hx   . Stomach cancer Neg Hx   . Liver disease Neg Hx     Current Facility-Administered Medications  Medication Dose Route Frequency Provider Last Rate Last Dose  . 0.9 %  sodium chloride infusion  500 mL Intravenous Continuous [name], [name], MD      . 0.9 % NaCl with KCl 40 mEq / L  infusion   Intravenous Continuous Shela Leff, MD 125 mL/hr at 03/29/18 1050 125 mL/hr at 03/29/18 1050  . ALPRAZolam Duanne Moron) tablet 0.5 mg  0.5 mg Oral BID PRN Shela Leff, MD      . alum & mag hydroxide-simeth (MAALOX/MYLANTA) 200-200-20 MG/5ML suspension 30 mL  30 mL Oral Q6H PRN Michael Boston, MD      . diphenhydrAMINE (BENADRYL) capsule 25 mg  25 mg Oral Q6H PRN Michael Boston, MD      . diphenhydrAMINE (BENADRYL) injection 12.5-25 mg  12.5-25 mg Intravenous Q6H PRN Michael Boston, MD      . diphenoxylate-atropine (LOMOTIL) 2.5-0.025 MG per tablet 1-2 tablet  1-2 tablet Oral QID PRN Michael Boston, MD      . enoxaparin (LOVENOX) injection 40 mg  40 mg Subcutaneous Q24H Shela Leff, MD      . feeding supplement (ENSURE SURGERY) liquid 237 mL  237 mL Oral BID BM Michael Boston, MD      . ferrous sulfate tablet 325 mg  325 mg Oral BID WC  Michael Boston, MD      . gabapentin (NEURONTIN) capsule 300 mg  300 mg Oral BID Shela Leff, MD      .  guaiFENesin-dextromethorphan (ROBITUSSIN DM) 100-10 MG/5ML syrup 10 mL  10 mL Oral Q4H PRN Michael Boston, MD      . hydrocortisone (ANUSOL-HC) 2.5 % rectal cream 1 application  1 application Topical QID PRN Michael Boston, MD      . hydrocortisone cream 1 % 1 application  1 application Topical TID PRN Michael Boston, MD      . hydrocortisone-pramoxine [facility]) 2.5-1 % rectal cream 1 application  1 application Rectal Z6X PRN Michael Boston, MD      . insulin aspart (novoLOG) injection 0-15 Units  0-15 Units Subcutaneous TID WC Michael Boston, MD      . insulin aspart (novoLOG) injection 0-5 Units  0-5 Units Subcutaneous QHS Michael Boston, MD      . lactated ringers bolus 1,000 mL  1,000 mL Intravenous Once Michael Boston, MD      . lactated ringers bolus 1,000 mL  1,000 mL Intravenous Q8H PRN Michael Boston, MD      . lip balm (CARMEX) ointment 1 application  1 application Topical BID Michael Boston, MD      . liver oil-zinc oxide (DESITIN) 40 % ointment   Topical BID Michael Boston, MD      . magic mouthwash  15 mL Oral QID PRN Michael Boston, MD      . menthol-cetylpyridinium (CEPACOL) lozenge 3 mg  1 lozenge Oral PRN Michael Boston, MD      . nicotine (NICODERM CQ - dosed in mg/24 hours) patch 14 mg  14 mg Transdermal Daily Shela Leff, MD      . paregoric 2 MG/5ML solution 10 mL  10 mL Oral TID WC & HS Michael Boston, MD      . paregoric 2 MG/5ML solution 5-10 mL  5-10 mL Oral Q6H PRN Michael Boston, MD      . phenol (CHLORASEPTIC) mouth spray 1-2 spray  1-2 spray Mouth/Throat PRN Michael Boston, MD      . psyllium (HYDROCIL/METAMUCIL) packet 1 packet  1 packet Oral BID Michael Boston, MD      . witch hazel-glycerin (TUCKS) pad 1 application  1 application Topical PRN Michael Boston, MD       Current Outpatient Medications  Medication Sig Dispense Refill  . ALPRAZolam (XANAX) 0.5 MG  tablet Take 1 tablet (0.5 mg total) by mouth 2 (two) times daily as needed for anxiety or sleep. 30 tablet 0  . gabapentin (NEURONTIN) 300 MG capsule Take 1 capsule (300 mg total) by mouth 2 (two) times daily. 60 capsule 3  . paregoric 2 MG/5ML solution Take 5-10 mLs by mouth 4 (four) times daily as needed for diarrhea or loose stools.     Facility-Administered Medications Ordered in Other Encounters  Medication Dose Route Frequency Provider Last Rate Last Dose  . sodium chloride flush (NS) 0.9 % injection 10 mL  10 mL Intravenous PRN Truitt Merle, MD   10 mL at 11/18/17 0953     No Known Allergies  BP 127/86   Pulse 77   Temp (!) 97.5 F (36.4 C) (Oral)   Resp 17   Ht 6' (1.829 m)   Wt 70.3 kg (155 lb)   SpO2 97%   BMI 21.02 kg/m   Dg Chest 2 View  Result Date: 03/29/2018 CLINICAL DATA:  Initial evaluation for acute syncope. EXAM: CHEST - 2 VIEW COMPARISON:  Prior radiograph from 01/26/2017. FINDINGS: Cardiac and mediastinal silhouettes are stable in size and contour. Right-sided Port-A-Cath with tip low lying in the right atrium  again noted, stable. Lungs normally inflated. No focal infiltrates. No pulmonary edema or pleural effusion. No pneumothorax. No acute osseous abnormality. IMPRESSION: 1. No active cardiopulmonary disease. 2. Right-sided Port-A-Cath with tip in the right atrium, stable. Electronically Signed   By: Jeannine Boga M.D.   On: 03/29/2018 06:16   Ct Head Wo Contrast  Result Date: 03/29/2018 CLINICAL DATA:  Altered level of consciousness. EXAM: CT HEAD WITHOUT CONTRAST TECHNIQUE: Contiguous axial images were obtained from the base of the skull through the vertex without intravenous contrast. COMPARISON:  None. FINDINGS: Brain: Right basal gangliar calcifications, typically incidental. No intracranial hemorrhage, mass effect, or midline shift. No hydrocephalus. The basilar cisterns are patent. No evidence of territorial infarct or acute ischemia. No extra-axial or  intracranial fluid collection. Vascular: No hyperdense vessel or unexpected calcification. Skull: No fracture or focal lesion. Sinuses/Orbits: Mild mucosal thickening of right side of sphenoid sinus and scattered ethmoid air cells. No sinus fluid levels. Mastoid air cells are clear. Visualized orbits are unremarkable. Other: None. IMPRESSION: No acute intracranial abnormality. Electronically Signed   By: Jeb Levering M.D.   On: 03/29/2018 05:49   Mr Jeri Cos And Wo Contrast  Result Date: 03/29/2018 CLINICAL DATA:  Altered level of consciousness. History of colon cancer. EXAM: MRI HEAD WITHOUT AND WITH CONTRAST TECHNIQUE: Multiplanar, multiecho pulse sequences of the brain and surrounding structures were obtained without and with intravenous contrast. CONTRAST:  64m MULTIHANCE GADOBENATE DIMEGLUMINE 529 MG/ML IV SOLN COMPARISON:  03/29/2018 FINDINGS: Brain: No acute infarction, hemorrhage, hydrocephalus, extra-axial collection or mass lesion. No significant white matter disease and no atrophy. Symmetric normal hippocampus appearance. Vascular: Flow voids and vascular enhancements are preserved Skull and upper cervical spine: No evidence of marrow lesion. Sinuses/Orbits: Negative. IMPRESSION: Negative brain MRI.  No explanation for symptoms. Electronically Signed   By: JMonte FantasiaM.D.   On: 03/29/2018 08:34    Note: This dictation was prepared with Dragon/digital dictation along with [employer] Any transcriptional errors that result from this process are unintentional.   .SAdin Hector M.D., F.A.C.S. Gastrointestinal and Minimally Invasive Surgery [facility] [address] [place] [place] [phone] / Paging  03/29/2018 11:54 AM

## 2018-03-29 NOTE — ED Notes (Signed)
EEG completed at this time.

## 2018-03-29 NOTE — ED Notes (Signed)
Meal Tray ordered at 17:35

## 2018-03-29 NOTE — Progress Notes (Signed)
EEG completed; results pending.    

## 2018-03-29 NOTE — ED Notes (Signed)
Neurology at bedside for evaluation.

## 2018-03-29 NOTE — ED Provider Notes (Signed)
[facility] EMERGENCY DEPARTMENT Provider Note   CSN: [id number] Arrival date & time: 03/29/18  0244     History   Chief Complaint Chief Complaint  Patient presents with  . Loss of Consciousness    HPI [name] is a 47 y.o. male.  Patient presents to the emergency department after an episode of loss of consciousness.  Significant other reports that he lost his collar and then became unresponsive.  She noted that he looked like he was staring at her but could not actually see her.  He did not respond to her voice or to touch.  She reports that he seemed like he was shaking all over like he was shivering, did not resemble a seizure.  EMS was called.  At arrival they noted that he was awake but somewhat incoherent.  Blood sugar was elevated.  He was transported to the emergency department.  He has improved and is now awake and alert, oriented.  He reports that he has a problem with nearly constant diarrhea secondary to ileostomy.  He has not had any nausea or vomiting.  He has not had any known fever, denies cough, congestion, flulike symptoms.  He has not been expensing any abdominal pain.     Past Medical History:  Diagnosis Date  . Anxiety   . Cancer ([place]) 12/01/2016   colon   . Family history of colon cancer   . Poor dental hygiene     Patient Active Problem List   Diagnosis Date Noted  . Stricture of ileoanal anastomosis 01/12/2018  . Muscle spasm 04/11/2017  . Encounter for antineoplastic chemotherapy 04/11/2017  . Diarrhea 02/17/2017  . 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 Hypomagnesemia 12/02/2016  . 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 10/13/2016  . Family history of colon cancer   . Anxiety 09/14/2016  . Rectal adenocarcinoma s/p protctocolectomy, IPAA "J" pouch 12/01/2016 08/06/2016    Past Surgical History:  Procedure Laterality Date  . EUS N/A  07/29/2016   Procedure: LOWER ENDOSCOPIC ULTRASOUND (EUS);  Surgeon: Milus Banister, MD;  Location: [facility];  Service: Endoscopy;  Laterality: N/A;  . ILEOSTOMY CLOSURE N/A 09/22/2017   Procedure: TAKEDOWN OF LOOP ILEOSTOMY;  Surgeon: Michael Boston, MD;  Location: [facility];  Service: General;  Laterality: N/A;  . PORTACATH PLACEMENT N/A 01/26/2017   Procedure: PLACEMENT OF PORT-A-CATH CENTRAL LINE WITH FLUOROSCOPY AND ULTRASOUND;  Surgeon: Michael Boston, MD;  Location: [facility];  Service: General;  Laterality: N/A;  . POUCHOSCOPY N/A 09/21/2017   Procedure: POUCHOSCOPY;  Surgeon: Leighton Ruff, MD;  Location: [facility];  Service: Endoscopy;  Laterality: N/A;  . POUCHOSCOPY N/A 01/12/2018   Procedure: POUCHOSCOPY WITH BIOPSIES;  Surgeon: Leighton Ruff, MD;  Location: [facility];  Service: Endoscopy;  Laterality: N/A;  Local  . PROCTOSCOPY N/A 12/01/2016   Procedure: RIGID PROCTOSCOPY;  Surgeon: Michael Boston, MD;  Location: [facility];  Service: General;  Laterality: N/A;  . TOE AMPUTATION Left   . XI ROBOTIC ASSISTED LOWER ANTERIOR RESECTION N/A 12/01/2016   Procedure: XI ROBOTIC ASSISTED PROCTOCOLECTOMY WITH ILLEOPOUCH ANASTAMOSIS WITH DIVERTING ILLEOSTOMY;  Surgeon: Michael Boston, MD;  Location: [facility];  Service: General;  Laterality: N/A;        Home Medications    Prior to Admission medications   Medication Sig Start Date End Date Taking? Authorizing Provider  ALPRAZolam Duanne Moron) 0.5 MG tablet Take 1 tablet (0.5  mg total) by mouth 2 (two) times daily as needed for anxiety or sleep. 02/16/18  Yes Truitt Merle, MD  gabapentin (NEURONTIN) 300 MG capsule Take 1 capsule (300 mg total) by mouth 2 (two) times daily. 02/16/18  Yes Truitt Merle, MD  paregoric 2 MG/5ML solution Take 5-10 mLs by mouth 4 (four) times daily as needed for diarrhea or loose stools.   Yes [provider]    Family History Family History  Problem Relation Age of Onset  . Diabetes Mother   . COPD  Father   . Colon cancer Maternal Aunt        dx in her 101s  . Diabetes Maternal Grandmother   . Brain cancer Maternal Grandfather   . Lung cancer Paternal Grandfather   . Colon cancer Cousin        dx in her 22s  . Bone cancer Sister 8  . Pancreatic cancer Neg Hx   . Esophageal cancer Neg Hx   . Stomach cancer Neg Hx   . Liver disease Neg Hx     Social History Social History   Tobacco Use  . Smoking status: Current Every Day Smoker    Packs/day: 1.50    Years: 30.00    Pack years: 45.00    Types: Cigarettes    Last attempt to quit: 12/01/2016    Years since quitting: 1.3  . Smokeless tobacco: Never Used  . Tobacco comment: reduced smoking to 2-3 daily  Substance Use Topics  . Alcohol use: No  . Drug use: No    Comment: patient denies     Allergies   Patient has no known allergies.   Review of Systems Review of Systems  Constitutional: Positive for fatigue.  Gastrointestinal: Positive for diarrhea.  Neurological: Positive for syncope.  All other systems reviewed and are negative.    Physical Exam Updated Vital Signs BP 103/75   Pulse 88   Temp (!) 97.5 F (36.4 C) (Oral)   Resp 17   Ht 6' (1.829 m)   Wt 70.3 kg (155 lb)   SpO2 97%   BMI 21.02 kg/m   Physical Exam  Constitutional: He is oriented to person, place, and time. He appears well-developed and well-nourished. No distress.  HENT:  Head: Normocephalic and atraumatic.  Right Ear: Hearing normal.  Left Ear: Hearing normal.  Nose: Nose normal.  Mouth/Throat: Oropharynx is clear and moist and mucous membranes are normal.  Eyes: Pupils are equal, round, and reactive to light. Conjunctivae and EOM are normal.  Neck: Normal range of motion. Neck supple.  Cardiovascular: Regular rhythm, S1 normal and S2 normal. Exam reveals no gallop and no friction rub.  No murmur heard. Pulmonary/Chest: Effort normal and breath sounds normal. No respiratory distress. He exhibits no tenderness.  Abdominal: Soft.  Normal appearance and bowel sounds are normal. There is no hepatosplenomegaly. There is no tenderness. There is no rebound, no guarding, no tenderness at McBurney's point and negative Murphy's sign. No hernia.  Musculoskeletal: Normal range of motion.  Neurological: He is alert and oriented to person, place, and time. He has normal strength. No cranial nerve deficit or sensory deficit. Coordination normal. GCS eye subscore is 4. GCS verbal subscore is 5. GCS motor subscore is 6.  Skin: Skin is warm, dry and intact. No rash noted. No cyanosis.  Psychiatric: He has a normal mood and affect. His speech is normal and behavior is normal. Thought content normal.  Nursing note and vitals reviewed.  ED Treatments / Results  Labs (all labs ordered are listed, but only abnormal results are displayed) Labs Reviewed  BASIC METABOLIC PANEL - Abnormal; Notable for the following components:      Result Value   Potassium 3.4 (*)    CO2 21 (*)    Glucose, Bld 256 (*)    BUN <5 (*)    Calcium 8.1 (*)    All other components within normal limits  URINALYSIS, ROUTINE W REFLEX MICROSCOPIC - Abnormal; Notable for the following components:   Glucose, UA >=500 (*)    Ketones, ur 5 (*)    All other components within normal limits  CBG MONITORING, ED - Abnormal; Notable for the following components:   Glucose-Capillary 209 (*)    All other components within normal limits  I-STAT CG4 LACTIC ACID, ED - Abnormal; Notable for the following components:   Lactic Acid, Venous 3.79 (*)    All other components within normal limits  CBC  DIFFERENTIAL  TROPONIN I  CK  HEPATIC FUNCTION PANEL  LIPASE, BLOOD  DIFFERENTIAL  I-STAT CG4 LACTIC ACID, ED  I-STAT CG4 LACTIC ACID, ED    EKG EKG Interpretation  Date/Time:  Wednesday March 29 2018 02:52:46 EDT Ventricular Rate:  85 PR Interval:  130 QRS Duration: 92 QT Interval:  404 QTC Calculation: 480 R Axis:   74 Text Interpretation:  Normal sinus rhythm  Prolonged QT Abnormal ECG Confirmed by Orpah Greek [phone]) on 03/29/2018 4:49:14 AM   Radiology Dg Chest 2 View  Result Date: 03/29/2018 CLINICAL DATA:  Initial evaluation for acute syncope. EXAM: CHEST - 2 VIEW COMPARISON:  Prior radiograph from 01/26/2017. FINDINGS: Cardiac and mediastinal silhouettes are stable in size and contour. Right-sided Port-A-Cath with tip low lying in the right atrium again noted, stable. Lungs normally inflated. No focal infiltrates. No pulmonary edema or pleural effusion. No pneumothorax. No acute osseous abnormality. IMPRESSION: 1. No active cardiopulmonary disease. 2. Right-sided Port-A-Cath with tip in the right atrium, stable. Electronically Signed   By: Jeannine Boga M.D.   On: 03/29/2018 06:16   Ct Head Wo Contrast  Result Date: 03/29/2018 CLINICAL DATA:  Altered level of consciousness. EXAM: CT HEAD WITHOUT CONTRAST TECHNIQUE: Contiguous axial images were obtained from the base of the skull through the vertex without intravenous contrast. COMPARISON:  None. FINDINGS: Brain: Right basal gangliar calcifications, typically incidental. No intracranial hemorrhage, mass effect, or midline shift. No hydrocephalus. The basilar cisterns are patent. No evidence of territorial infarct or acute ischemia. No extra-axial or intracranial fluid collection. Vascular: No hyperdense vessel or unexpected calcification. Skull: No fracture or focal lesion. Sinuses/Orbits: Mild mucosal thickening of right side of sphenoid sinus and scattered ethmoid air cells. No sinus fluid levels. Mastoid air cells are clear. Visualized orbits are unremarkable. Other: None. IMPRESSION: No acute intracranial abnormality. Electronically Signed   By: Jeb Levering M.D.   On: 03/29/2018 05:49    Procedures Procedures (including critical care time)  Medications Ordered in ED Medications  sodium chloride 0.9 % bolus 1,000 mL (1,000 mLs Intravenous New Bag/Given 03/29/18 0516)      Initial Impression / Assessment and Plan / ED Course  I have reviewed the triage vital signs and the nursing notes.  Pertinent labs & imaging results that were available during my care of the patient were reviewed by me and considered in my medical decision making (see chart for details).     Patient presents to the ER for evaluation of an episode of unresponsiveness.  Patient had a shaking episode where he was staring and did not respond to his significant other.  He had a proximately 10-minute period where he was not responding and then began more responsive but confused which slowly improved.  This is suspicious for seizure.  He did have an elevated lactic acid on arrival but there is no obvious source of infection.  This cleared quickly, also supporting the possibility of seizure.  Patient has had previous rectal cancer treated.  He reports that he has had possible metastasis to his lung which is being "watched" by his oncologist.  Head CT did not show any obvious mass-effect.  Discussed briefly with on-call neurologist.  Patient will have MRI with and without contrast to evaluate for the possibility of metastatic brain disease.  Will admit to hospitalist service for monitoring.  Final Clinical Impressions(s) / ED Diagnoses   Final diagnoses:  Syncope, unspecified syncope type    ED Discharge Orders    None       Orpah Greek, MD 03/29/18 [phone]

## 2018-03-29 NOTE — Consult Note (Signed)
NEURO HOSPITALIST CONSULT NOTE   Requestig physician: Dr. Betsey Holiday  Reason for Consult: Loss of consciousness with shaking  History obtained from:  Patient and Girlfriend  HPI:                                                                                                                                          [name] is an 47 y.o. male with rectal cancer status post proctocolectomy with ileoanal anastomosis and diverting loop ileostomy in 2017 and recurrent difficulties with diarrhea who presented to the ED early this AM after a 10 minute episode of impaired consciousness at home. Per family member he was noted to be cold and shaking as though shivering. During the spell she noted that he looked like he was staring at her but could not actually see her and did not respond to her voice or to touch; he was also noted to be shaking all over like he was shivering, with family member stating that it did not resemble a seizure.   Per EMS, on arrival he was incoherent, hypothermic at 97 and with constricted pupils. CBG was 205 and eyes appeared bloodshot.   In the ED he improved and became awake, alert and oriented. Neurology was consulted for possible seizure.  Past Medical History:  Diagnosis Date  . Anxiety   . Cancer ([place]) 12/01/2016   colon   . Family history of colon cancer   . Poor dental hygiene     Past Surgical History:  Procedure Laterality Date  . EUS N/A 07/29/2016   Procedure: LOWER ENDOSCOPIC ULTRASOUND (EUS);  Surgeon: Milus Banister, MD;  Location: [facility];  Service: Endoscopy;  Laterality: N/A;  . ILEOSTOMY CLOSURE N/A 09/22/2017   Procedure: TAKEDOWN OF LOOP ILEOSTOMY;  Surgeon: Michael Boston, MD;  Location: [facility];  Service: General;  Laterality: N/A;  . PORTACATH PLACEMENT N/A 01/26/2017   Procedure: PLACEMENT OF PORT-A-CATH CENTRAL LINE WITH FLUOROSCOPY AND ULTRASOUND;  Surgeon: Michael Boston, MD;  Location: [facility];   Service: General;  Laterality: N/A;  . POUCHOSCOPY N/A 09/21/2017   Procedure: POUCHOSCOPY;  Surgeon: Leighton Ruff, MD;  Location: [facility];  Service: Endoscopy;  Laterality: N/A;  . POUCHOSCOPY N/A 01/12/2018   Procedure: POUCHOSCOPY WITH BIOPSIES;  Surgeon: Leighton Ruff, MD;  Location: [facility];  Service: Endoscopy;  Laterality: N/A;  Local  . PROCTOSCOPY N/A 12/01/2016   Procedure: RIGID PROCTOSCOPY;  Surgeon: Michael Boston, MD;  Location: [facility];  Service: General;  Laterality: N/A;  . TOE AMPUTATION Left   . XI ROBOTIC ASSISTED LOWER ANTERIOR RESECTION N/A 12/01/2016   Procedure: XI ROBOTIC ASSISTED PROCTOCOLECTOMY WITH ILLEOPOUCH ANASTAMOSIS WITH DIVERTING ILLEOSTOMY;  Surgeon: Michael Boston, MD;  Location: [facility];  Service: General;  Laterality: N/A;  Family History  Problem Relation Age of Onset  . Diabetes Mother   . COPD Father   . Colon cancer Maternal Aunt        dx in her 101s  . Diabetes Maternal Grandmother   . Brain cancer Maternal Grandfather   . Lung cancer Paternal Grandfather   . Colon cancer Cousin        dx in her 7s  . Bone cancer Sister 8  . Pancreatic cancer Neg Hx   . Esophageal cancer Neg Hx   . Stomach cancer Neg Hx   . Liver disease Neg Hx              Social History:  reports that he has been smoking cigarettes.  He has a 45.00 pack-year smoking history. He has never used smokeless tobacco. He reports that he does not drink alcohol or use drugs.  No Known Allergies  HOME MEDICATIONS:                                                                                                                       ROS:                                                                                                                                       Recurrent, nearly constant diarrhea from ileostomy. No fever or cough. No N/V.   Blood pressure 118/82, pulse 65, temperature (!) 97.5 F (36.4 C), temperature source Oral, resp. rate 15, height 6' (1.829  m), weight 70.3 kg (155 lb), SpO2 98 %.   General Examination:                                                                                                       Physical Exam  HEENT-  [place]/AT. Mild decreased hydration of mucosa Lungs- Respirations unlabored Extremities- No edema. Skin appears somewhat dry.    Neurological Examination Mental Status: Alert, oriented, thought content appropriate.  Speech fluent without evidence  of aphasia.  Able to follow all commands without difficulty. Cranial Nerves: II: Visual fields grossly normal  III,IV, VI: ptosis not present, EOMI V,VII: smile symmetric, facial temp sensation normal bilaterally VIII: hearing intact to voice IX,X: Unremarkable XI: Symmetric XII: midline tongue extension Motor: Right : Upper extremity   5/5    Left:     Upper extremity   5/5  Lower extremity   5/5     Lower extremity   5/5 Normal tone throughout; no atrophy noted Sensory: Temp and light touch intact bilaterally Deep Tendon Reflexes: 1+ and symmetric throughout Plantars: Right: downgoing   Left: downgoing Cerebellar: No ataxia with FNF bilaterally Gait: Deferred   Lab Results: Basic Metabolic Panel: Recent Labs  Lab 03/29/18 0307  NA 138  K 3.4*  CL 105  CO2 21*  GLUCOSE 256*  BUN <5*  CREATININE 0.98  CALCIUM 8.1*    CBC: Recent Labs  Lab 03/29/18 0307  WBC 9.4  HGB 16.1  HCT 48.5  MCV 88.8  PLT 189    Cardiac Enzymes: Recent Labs  Lab 03/29/18 0504  CKTOTAL 55  TROPONINI <0.03    Lipid Panel: No results for input(s): CHOL, TRIG, HDL, CHOLHDL, VLDL, LDLCALC in the last 168 hours.  Imaging: Dg Chest 2 View  Result Date: 03/29/2018 CLINICAL DATA:  Initial evaluation for acute syncope. EXAM: CHEST - 2 VIEW COMPARISON:  Prior radiograph from 01/26/2017. FINDINGS: Cardiac and mediastinal silhouettes are stable in size and contour. Right-sided Port-A-Cath with tip low lying in the right atrium again noted, stable. Lungs normally  inflated. No focal infiltrates. No pulmonary edema or pleural effusion. No pneumothorax. No acute osseous abnormality. IMPRESSION: 1. No active cardiopulmonary disease. 2. Right-sided Port-A-Cath with tip in the right atrium, stable. Electronically Signed   By: Jeannine Boga M.D.   On: 03/29/2018 06:16   Ct Head Wo Contrast  Result Date: 03/29/2018 CLINICAL DATA:  Altered level of consciousness. EXAM: CT HEAD WITHOUT CONTRAST TECHNIQUE: Contiguous axial images were obtained from the base of the skull through the vertex without intravenous contrast. COMPARISON:  None. FINDINGS: Brain: Right basal gangliar calcifications, typically incidental. No intracranial hemorrhage, mass effect, or midline shift. No hydrocephalus. The basilar cisterns are patent. No evidence of territorial infarct or acute ischemia. No extra-axial or intracranial fluid collection. Vascular: No hyperdense vessel or unexpected calcification. Skull: No fracture or focal lesion. Sinuses/Orbits: Mild mucosal thickening of right side of sphenoid sinus and scattered ethmoid air cells. No sinus fluid levels. Mastoid air cells are clear. Visualized orbits are unremarkable. Other: None. IMPRESSION: No acute intracranial abnormality. Electronically Signed   By: Jeb Levering M.D.   On: 03/29/2018 05:49    Assessment: 47 year old male with 10 minute episode of altered sensorium and shivering movements 1. Overall semiology described in detail by girlfriend seems more consistent with a hypotensive or hypoglycemic episode than a seizure. Several aspects of the spell seem less consistent with seizure, including sudden onset and resolution, awake state during spell, no tongue biting, no incontinence, no evolution and no high amplitude jerking, posturing, lateralized motor activity or tonic phase.  2. History of rectal cancer. 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 MRI brain with and without contrast 2. EEG.  3.  IV hydration 4. Orthostatics 5. Work up for possible infection  Electronically signed: Dr. Kerney Elbe 03/29/2018, 7:19 AM

## 2018-03-29 NOTE — ED Notes (Signed)
Patient transported to MRI 

## 2018-03-29 NOTE — ED Notes (Signed)
Pt returned from MRI at this time

## 2018-03-29 NOTE — ED Notes (Signed)
Pharm called to verify orders.

## 2018-03-29 NOTE — H&P (Signed)
Date: 03/29/2018               Patient Name:  [name] MRN: [id number]  DOB: [birth year] Age / Sex: 47 y.o., male   PCP: Patient, No Pcp Per         Medical Service: Internal Medicine Teaching Service         Attending Physician: Dr. Evette Doffing, Mallie Mussel, *    First Contact: Dr. Trilby Drummer  Pager: [phone]  Second Contact: Dr. Hetty Ely Pager: [phone]       After Hours (After 5p/  First Contact Pager: [phone]  weekends / holidays): Second Contact Pager: [phone]   Chief Complaint: AMS  History of Present Illness: Patient is a 47 year old male with a history of Lynch syndrome and colorectal adenocarcinoma status post total proctocolectomy in December 2017 and chronic diarrhea presenting to the hospital for further evaluation of altered mental status.  Majority of the history provided by patient's girlfriend at the bedside.  Girlfriend states that patient walked into her room around 2:30 this morning and stated he was not feeling good.  He then laid down on the bed and became unresponsive.  States his eyes were open at that time as if he was staring ahead and were bloodshot in color.  He was having shallow breathing at that time and appeared pale and clammy.  She believes his whole body was slightly shivering but does not believe he had any shaking/ seizure like activity.  She denies noticing any tongue biting, fecal or urinary incontinence.  States it seemed as if he was trying to say something but could not get words out.  The entire episode lasted about 10-15 minutes.  Patient then returned to his baseline mental status about 7 minutes after EMS had arrived.  Per patient, he remembers feeling very anxious early this morning prior to this episode.  He remembers seeing what was going on around him during this episode but could not talk.  He denies having any history of seizures.  States he has been having 20-30 episodes of watery stool per day despite taking paregoric 4 times a day as instructed  by his surgeon.  He believes he has been drinking plenty of fluids to stay hydrated and his appetite has been good.  He continues to take Xanax for his anxiety.  Denies having any dizziness, chest pain, shortness of breath, abdominal pain, or fevers.  Denies any illicit drug use.  Per EMS, patient was incoherent, hypothermic at 97 F, CBG 205, he had constricted pupils and his eyes appeared blood shot.  On arrival to the ED, temperature 97.22F, pulse 86, respiratory rate 16, blood pressure 111/79, and SPO2 97% on room air.  I-STAT troponin negative.  CK normal. No leukocytosis.  UA not suggestive of infection.  Chest x-ray not suggestive of consolidation or pulmonary edema.  Head CT and brain MRI without any acute intracranial abnormality.  He received 1 L normal saline bolus in the ED.  Meds:  Current Facility-Administered Medications for the 03/29/18 encounter [facility] Encounter)  Medication  . 0.9 %  sodium chloride infusion   Current Meds  Medication Sig  . ALPRAZolam (XANAX) 0.5 MG tablet Take 1 tablet (0.5 mg total) by mouth 2 (two) times daily as needed for anxiety or sleep.  Marland Kitchen gabapentin (NEURONTIN) 300 MG capsule Take 1 capsule (300 mg total) by mouth 2 (two) times daily.  . paregoric 2 MG/5ML solution Take 5-10 mLs by mouth 4 (four) times daily  as needed for diarrhea or loose stools.     Allergies: Allergies as of 03/29/2018  . (No Known Allergies)   Past Medical History:  Diagnosis Date  . Anxiety   . Cancer ([place]) 12/01/2016   colon   . Family history of colon cancer   . Poor dental hygiene     Family History: Mom- diabetes, leg amputations, kidney disease Maternal grandmother- diabetes Maternal grandfather- brain cancer  Social History: Smoking 1 pack/day for the past 35 years.  Denies any ethanol or illicit drug use.  Review of Systems: A complete ROS was negative except as per HPI.  Physical Exam: Blood pressure (!) 132/94, pulse 73, temperature (!) 97.5 F  (36.4 C), temperature source Oral, resp. rate 16, height 6' (1.829 m), weight 155 lb (70.3 kg), SpO2 97 %. Physical Exam  Constitutional: He is oriented to person, place, and time. No distress.  Resting comfortably in a hospital stretcher.  HENT:  Head: Normocephalic.  Mouth/Throat: Oropharynx is clear and moist.  Eyes: Right eye exhibits no discharge. Left eye exhibits no discharge.  Cardiovascular: Normal rate, regular rhythm and intact distal pulses. Exam reveals no gallop and no friction rub.  No murmur heard. Pulmonary/Chest: Effort normal and breath sounds normal. No respiratory distress. He has no wheezes. He has no rales.  Abdominal: Soft. Bowel sounds are normal. He exhibits no distension. There is no tenderness.  Musculoskeletal: He exhibits no edema.  Neurological: He is alert and oriented to person, place, and time. No cranial nerve deficit. He exhibits normal muscle tone. Coordination normal.  +1 biceps and brachioradialis reflex bilaterally Trace patellar reflex bilaterally Sensation intact throughout Strength 5 out of 5 in bilateral upper and lower extremities  Skin: Skin is warm and dry.   EKG: personally reviewed my interpretation is normal sinus rhythm with heart rate 85, QTC 480.  CXR: personally reviewed my interpretation is no consolidation or pulmonary edema.  Assessment & Plan by Problem: Active Problems:   Altered mental status  47 year old male with a history of Lynch syndrome and colorectal adenocarcinoma status post total proctocolectomy in December 2017 and chronic diarrhea presenting to the hospital for further evaluation of unresponsiveness.  Altered mental status: Possibly due to transient hypotension at home in the setting of 20-30 bowel movements per day due to history of colectomy.  Per history, he was anxious, pale, clammy, and shivering at that time. Although he was normotensive on arrival. He was slightly hypothermic, however, no infectious etiology  identified so far - no leukocytosis, chest x-ray was negative for pneumonia, and UA not suggestive of infection.  Lactic acid initially 3.79 and improved to 1.3 even before the patient received at any fluids in the ED.  Although he has a history of rectal adenocarcinoma, surveillance CT scans done in 2018 showing stable pulmonary nodules and no evidence of metastasis.  Head CT and brain MRI negative for any acute abnormality.  CK normal.  Discussed with Dr. Cheral Marker.  His history does not seem to be consistent with seizure-like activity with lack of incontinence/ tongue biting and it is questionable whether he was truly postictal. He recommended doing an EEG at this time. -Admit to telemetry -Appreciate neurology recommendations -EEG -Check orthostatic vitals -Check UDS  History of Lynch syndrome and rectal adenocarcinoma (distal rectum) with indeterminate lung nodules: He is being followed by oncology (Dr. Annamaria Boots).  Per oncology note, his previous CT scan showed a few lung nodules which appeared to be suspicious for metastatic rectal cancer.  PET scan done in August 2017 revealed small lung nodules and periaortic lymph node which were not hypermetabolic.  He previously completed neoadjuvant chemoradiation from August 2017 to October 2017.  He had surgical resection but continued to had significant residual disease and again received chemotherapy from January 2018 to June 2018.  He underwent ileostomy in October 2018.  Surveillance CT scans done in July 2018 and December 2018 revealed small stable lung nodules and no evidence of metastasis.  Last oncology office visit was in February 2019. 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  He had loop ileostomy taken down in October 2018.  Pouchoscopy with  coloanal stricture dilation in January 2019.  He has persistent diarrhea despite high-dose paregoric and other regimens.  Seen recently by surgery (Dr. Johney Maine) and plan is ileostomy takedown.  He continues to have 20-30 bowel movements per day.  Labs showing mild hypokalemia with potassium 3.4.  Bicarb mildly low at 21. -Continue home paregoric 2 mg/ 5 mL solution: 5-10 mL's by mouth 4 times a day as needed for diarrhea or loose stools. -Normal saline + potassium -Repeat BMP in a.m.  Elevated blood glucose: Fasting blood glucose 256 and UA showing glucosuria. He does have a family history of diabetes. -Check A1c  Anxiety -Continue home Xanax 0.5 mg twice a day as needed  Peripheral neuropathy -Continue home gabapentin 300 mg twice daily  Tobacco use -Counseled on smoking cessation -Nicotine patch  Diet: Regular  DVT prophylaxis: Lovenox  CODE STATUS: Patient wishes to be DNR.  Dispo: Admit patient to Observation with expected length of stay less than 2 midnights.  Signed: Shela Leff, MD 03/29/2018, 9:30 AM  Pager: [phone]

## 2018-03-29 NOTE — ED Notes (Signed)
Meal Tray delivered and at bedside.  

## 2018-03-29 NOTE — ED Notes (Signed)
Patient transported to CT 

## 2018-03-29 NOTE — ED Notes (Signed)
Internal medicine at bedside for evaluation.

## 2018-03-29 NOTE — Procedures (Signed)
ELECTROENCEPHALOGRAM REPORT  Date of Study: 03/29/2018  Patient's Name: [name] MRN: [id number] Date of Birth: 1971/02/26  Referring Provider: Shela Leff, MD  Clinical History: 47 year old male with rectal cancer status post proctocolectomy presents with transient episode of shaking and altered awareness.  Medications: Gabapentn Alprazolam Diphenhydramine Diphenoxylate-atropine Insulin Nicotine patch  Technical Summary: A multichannel digital EEG recording measured by the international 10-20 system with electrodes applied with paste and impedances below 5000 ohms performed in our laboratory with EKG monitoring in an awake and asleep patient.  Hyperventilation and photic stimulation were performed.  The digital EEG was referentially recorded, reformatted, and digitally filtered in a variety of bipolar and referential montages for optimal display.    Description: The patient is awake and asleep during the recording.  During maximal wakefulness, there is a symmetric, medium voltage 10 Hz posterior dominant rhythm that attenuates with eye opening.  The record is symmetric.  During drowsiness and sleep, there is an increase in theta slowing of the background.  Vertex waves and symmetric sleep spindles were seen.  Hyperventilation and photic stimulation did not elicit any abnormalities.  There were no epileptiform discharges or electrographic seizures seen.    EKG lead was unremarkable.  Impression: This awake and asleep EEG is normal.    Clinical Correlation: A normal EEG does not exclude a clinical diagnosis of epilepsy.  If further clinical questions remain, prolonged EEG may be helpful.  Clinical correlation is advised.   Metta Clines, DO

## 2018-03-29 NOTE — ED Notes (Signed)
Food Tray delivered and at bedside.  

## 2018-03-29 NOTE — ED Notes (Signed)
Pt had 1 episode of diarrhea while in CT and another episode when pt returned to room

## 2018-03-29 NOTE — ED Notes (Signed)
ED Provider at bedside. 

## 2018-03-29 NOTE — ED Triage Notes (Addendum)
Per family, pt had a LOC for about 10 minutes.  She reports cold, "shaking like cold shaking not seizure."  Per EMS pt was incoherent, hypothermic at 97, and had constricted pupils.  CBG as 205.  Eyes appear blood shot.

## 2018-03-30 DIAGNOSIS — Z932 Ileostomy status: Secondary | ICD-10-CM | POA: Diagnosis not present

## 2018-03-30 DIAGNOSIS — A0472 Enterocolitis due to Clostridium difficile, not specified as recurrent: Secondary | ICD-10-CM

## 2018-03-30 DIAGNOSIS — R918 Other nonspecific abnormal finding of lung field: Secondary | ICD-10-CM | POA: Diagnosis not present

## 2018-03-30 DIAGNOSIS — K5289 Other specified noninfective gastroenteritis and colitis: Secondary | ICD-10-CM

## 2018-03-30 DIAGNOSIS — A044 Other intestinal Escherichia coli infections: Secondary | ICD-10-CM

## 2018-03-30 DIAGNOSIS — Z9049 Acquired absence of other specified parts of digestive tract: Secondary | ICD-10-CM

## 2018-03-30 DIAGNOSIS — R739 Hyperglycemia, unspecified: Secondary | ICD-10-CM | POA: Diagnosis not present

## 2018-03-30 DIAGNOSIS — F419 Anxiety disorder, unspecified: Secondary | ICD-10-CM

## 2018-03-30 DIAGNOSIS — Z79899 Other long term (current) drug therapy: Secondary | ICD-10-CM | POA: Diagnosis not present

## 2018-03-30 DIAGNOSIS — R55 Syncope and collapse: Secondary | ICD-10-CM | POA: Diagnosis not present

## 2018-03-30 DIAGNOSIS — E876 Hypokalemia: Secondary | ICD-10-CM | POA: Diagnosis not present

## 2018-03-30 DIAGNOSIS — Z85030 Personal history of malignant carcinoid tumor of large intestine: Secondary | ICD-10-CM | POA: Diagnosis not present

## 2018-03-30 DIAGNOSIS — Z1509 Genetic susceptibility to other malignant neoplasm: Secondary | ICD-10-CM

## 2018-03-30 DIAGNOSIS — Z833 Family history of diabetes mellitus: Secondary | ICD-10-CM

## 2018-03-30 HISTORY — DX: Enterocolitis due to Clostridium difficile, not specified as recurrent: A04.72

## 2018-03-30 LAB — GLUCOSE, CAPILLARY
GLUCOSE-CAPILLARY: 86 mg/dL (ref 65–99)
Glucose-Capillary: 97 mg/dL (ref 65–99)

## 2018-03-30 LAB — BASIC METABOLIC PANEL
Anion gap: 5 (ref 5–15)
CO2: 24 mmol/L (ref 22–32)
CREATININE: 0.85 mg/dL (ref 0.61–1.24)
Calcium: 7.8 mg/dL — ABNORMAL LOW (ref 8.9–10.3)
Chloride: 113 mmol/L — ABNORMAL HIGH (ref 101–111)
GFR calc Af Amer: >60 mL/min (ref >60)
Glucose, Bld: 92 mg/dL (ref 65–99)
Potassium: 4 mmol/L (ref 3.5–5.1)
SODIUM: 142 mmol/L (ref 135–145)

## 2018-03-30 LAB — HIV ANTIBODY (ROUTINE TESTING W REFLEX): HIV Screen 4th Generation wRfx: NONREACTIVE

## 2018-03-30 LAB — CLOSTRIDIUM DIFFICILE BY PCR, REFLEXED: Toxigenic C. Difficile by PCR: POSITIVE — AB

## 2018-03-30 LAB — C DIFFICILE QUICK SCREEN W PCR REFLEX
C DIFFICILE (CDIFF) TOXIN: NEGATIVE
C DIFFICLE (CDIFF) ANTIGEN: POSITIVE — AB

## 2018-03-30 MED ORDER — VANCOMYCIN 50 MG/ML ORAL SOLUTION
125.0000 mg | Freq: Four times a day (QID) | ORAL | Status: DC
  Administered 2018-03-30: 125 mg via ORAL
  Filled 2018-03-30 (×3): qty 2.5

## 2018-03-30 MED ORDER — VANCOMYCIN 50 MG/ML ORAL SOLUTION
125.0000 mg | Freq: Four times a day (QID) | ORAL | Status: DC
  Filled 2018-03-30 (×2): qty 2.5

## 2018-03-30 MED ORDER — ENSURE ENLIVE PO LIQD: 237.0000 mL | ORAL | Status: DC

## 2018-03-30 MED ORDER — SACCHAROMYCES BOULARDII 250 MG PO CAPS
250.0000 mg | ORAL_CAPSULE | Freq: Two times a day (BID) | ORAL | Status: DC
  Administered 2018-03-30: 250 mg via ORAL
  Filled 2018-03-30: qty 1

## 2018-03-30 MED ORDER — VANCOMYCIN 50 MG/ML ORAL SOLUTION: 125.0000 mg | Freq: Four times a day (QID) | ORAL | 0 refills | Status: AC

## 2018-03-30 NOTE — Progress Notes (Signed)
Subjective:  [name] is still experiencing diarrhea today and continues to express emotional distress about it. He denies any repeat episodes of syncope, chills, or lightheadedness. In regard to being told he has C. diff, he expresses concern about the infection recurring or spreading. He was educated on the high rate of recurrence. Also educated on the importance of treating the infection given plans for upcoming GI surgery, though it's unclear how much his diarrhea will improve post-antibiotic therapy.  Objective:  Vital signs in last 24 hours: Vitals:   03/29/18 1852 03/29/18 2152 03/30/18 0520 03/30/18 0925  BP: (!) 129/91 (!) 137/97 110/79 118/81  Pulse: 71 88 87 93  Resp: _0 Temp: 97.7 F (36.5 C) 97.6 F (36.4 C) 97.8 F (36.6 C) 98.3 F (36.8 C)  TempSrc: Oral Oral Oral Oral  SpO2: 98% 99% 98% 98%  Weight:  70.3 kg (155 lb 1.1 oz)    Height:       Weight change: 0.031 kg (1.1 oz)  Physical Exam General appearance: lying in bed, NAD Lungs: clear to auscultation bilaterally Heart: regular rate and rhythm, S1, S2 normal, no murmur, click, rub or gallop Abdomen: +bowel sounds, soft, non-tender, non-distended Extremities: extremities normal, atraumatic, no cyanosis or edema Neurologic: Mental status: Alert, oriented, thought content appropriate   Assessment/Plan:  Principal Problem:   Near syncope Active Problems:   Rectal adenocarcinoma s/p protctocolectomy, IPAA "J" pouch 12/01/2016   Anxiety   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   Status post loop ileostomy takedown 09/22/2017   Ileal-anal "J" pouch in place   Chronic diarrhea s/p proctocolectomy & ileoanal J pouch   Stricture of ileoanal anastomosis s/p dilitations   Hyperglycemia   Tobacco abuse   Hypokalemia   Protein-calorie malnutrition, moderate (HCC)   C. difficile diarrhea   Situational syncope  [name] is a 47 year old male with a history of Lynch syndrome and  colorectal adenocarcinoma s/p total proctocolectomy with ileoanal anastomosis and diverting loop ileostomy (2017) with ileostomy takedown (2018), chronic diarrhea, and anxiety presenting to the hospital for further evaluation of a syncopal episode.  Situational syncope: Patient had a bowel movement that was followed by feelings of anxiety and a 10-15 minute syncopal episode. No tongue biting or fecal or urinary incontinence during the episode along with normal EEG make seizure less likely. Concern for brain mets given history of colon cancer and prior stable lung nodules, but negative brain MRI is reassuring. Patient had negative orthostatic signs. The patient likely experienced situational syncope given that the episode followed a bowel movement. Denies recurrence of symptoms. - Continue to monitor   C diff colitis: Patient reports having 20-30 loose bowel movements a day for around the last 5 months. C diff PCR positive for toxigenic strain with little to no toxin production. Treating as probable C diff colitis given prolonged watery diarrhea and upcoming ileoanal J-pouch resection and creation of permanent ileostomy.  - Vancomycin 122m PO for 10 days  History of Lynch syndrome and rectal adenocarcinoma (distal rectum) with indeterminate lung nodules: Followed by oncology (Dr. FAnnamaria Boots. History of several lung nodules on prior CT scans, stable since December 2018 with no evidence of metastasis. Last oncology office visit was in February 2019. During this admission, negative brain MRI. - Oncology follow-up after discharge  Chronic diarrhea d/t total proctocolectomy: History of chronic diarrhea and multiple bowel procedures as above. On presentation, K 3.4 and bicarb 21. Hypokalemia resolved to 4.0 following IV normal saline  with potassium.  - Continue home paregoric solution 5-10 mL PO 4 times daily as needed  Hyperglycemia: On presentation, fasting blood glucose 256 and UA showing glucosuria. A1c 5.5%.  Glucose controlled at 80s-90s today.  - Continue to monitor  Chronic/Stable: Anxiety: Continue home Xanax 0.99m prn Peripheral neuropathy: Continue home gapapentin 3073mBID Tobacco use: Patient recognizes the need to quit but admits having difficulty. Nicotine patch.  Diet: Regular DVT ppx: Lovenox 4024mCode Status: DNR  Dispo: Anticipate discharge in 1-2 days.   LOS: 0 days   GurElige Radonedical Student 03/30/2018, 11:37 AM

## 2018-03-30 NOTE — Discharge Summary (Signed)
Name: [name] MRN: [id number] DOB: 24-Oct-1971 47 y.o. PCP: Patient, No Pcp Per  Date of Admission: 03/29/2018  2:44 AM Date of Discharge: 03/30/2018 Attending Physician: Tyrone Filler, MD  Discharge Diagnosis:  Principal Problem:   Situational syncope Active Problems:   Rectal adenocarcinoma s/p protctocolectomy, IPAA "J" pouch 12/01/2016   Anxiety   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   Status post loop ileostomy takedown 09/22/2017   Ileal-anal "J" pouch in place   Chronic diarrhea s/p proctocolectomy & ileoanal J pouch   Stricture of ileoanal anastomosis s/p dilitations   Tobacco abuse   Protein-calorie malnutrition, moderate (HCC)   C. difficile diarrhea   Discharge Medications: Allergies as of 03/30/2018   No Known Allergies     Medication List    TAKE these medications   ALPRAZolam 0.5 MG tablet Commonly known as:  XANAX Take 1 tablet (0.5 mg total) by mouth 2 (two) times daily as needed for anxiety or sleep.   gabapentin 300 MG capsule Commonly known as:  NEURONTIN Take 1 capsule (300 mg total) by mouth 2 (two) times daily.   paregoric 2 MG/5ML solution Take 5-10 mLs by mouth 4 (four) times daily as needed for diarrhea or loose stools.   vancomycin 50 mg/mL oral solution Commonly known as:  VANCOCIN Take 2.5 mLs (125 mg total) by mouth 4 (four) times daily for 10 days.       Disposition and follow-up:   [name] was discharged from [facility] in Stable condition.  At the hospital follow up visit please address:  Situation Syncope: - Evaluate for recurrence of symptoms  Chronic Diarrhea: With Toxigenic C. diff colonization - Ensure completion of Oral Vancomycin course - Evaluate for changes in diarrhea - Ensure wound care as needed  2.  Labs / imaging needed at time of follow-up: None  3.  Pending labs/ test needing follow-up: UDS, Stool Culture  Follow-up Appointments:   Hospital  Course by problem list:  Situational Syncope: Patient presented after episode of syncope / Abnormal behavior following a bowl movement while feeling anxious (in the setting of chronic diarrhea due to colectomy 2/2 rectal adenocarcinoma and chronic diarrhea). He went to his bed, lay down and had altered level of consciousness for about 63mn per witnesses. He was reportedly cold and pale during this episode. Normotensive here with orthostatic positive for only HR laying to sitting (without symptoms). No murmur on exam. No seizure-like activity. CT Head and MR Brain negative for acute abnormality. EEG normal. UDS Pending at discharge. Patients story consistent with Situational Syncope.  C. Difficile Chronic diarrhea due to total colectomy: History of adenocarcinoma; Diverting loop ileostomy Dec 2017; Loop ileostomy taken down Oct 2018; Pouchoscopy with stricture dilation Jan 2019. He has on going, chronic diarrhea with 20-30 Bowl movements per day. He is on high-dose paregoric at home which was continued while inpatient. Patient was screened for C. Diff and found to have PCR positive for toxigenic strain with minimal toxin production (toxin screen negative). In the setting of his frequent watery stools, complicated GI history, and upcoming surgery; the decision was made to treat for C. difficile infection. He was discharged on Vancomycin 1262mOral Solution QID x 10 Days. Stool culture pending at discharge.  Discharge Vitals:   BP 118/81 (BP Location: Left Arm)   Pulse 93   Temp 98.3 F (36.8 C) (Oral)   Resp 20   Ht 6' (1.829 m)   Wt 155  lb 1.1 oz (70.3 kg)   SpO2 98%   BMI 21.03 kg/m   Pertinent Labs, Studies, and Procedures:  CBC Latest Ref Rng & Units 03/29/2018 02/16/2018 11/18/2017  WBC 4.0 - 10.5 K/uL 9.4 7.4 6.5  Hemoglobin 13.0 - 17.0 g/dL 16.1 16.2 14.9  Hematocrit 39.0 - 52.0 % 48.5 48.7 44.6  Platelets 150 - 400 K/uL 189 185 187   BMP Latest Ref Rng & Units 03/30/2018 03/29/2018  02/16/2018  Glucose 65 - 99 mg/dL 92 256(H) 71  BUN 6 - 20 mg/dL <5(L) <5(L) 5(L)  Creatinine 0.61 - 1.24 mg/dL 0.85 0.98 0.86  Sodium 135 - 145 mmol/L 142 138 141  Potassium 3.5 - 5.1 mmol/L 4.0 3.4(L) 4.0  Chloride 101 - 111 mmol/L 113(H) 105 110(H)  CO2 22 - 32 mmol/L 24 21(L) 26  Calcium 8.9 - 10.3 mg/dL 7.8(L) 8.1(L) 8.2(L)   Urinalysis    Component Value Date/Time   COLORURINE YELLOW 03/29/2018 [place] 03/29/2018 0313   LABSPEC 1.020 03/29/2018 0313   PHURINE 6.0 03/29/2018 0313   GLUCOSEU >=500 (A) 03/29/2018 0313   HGBUR NEGATIVE 03/29/2018 0313   BILIRUBINUR NEGATIVE 03/29/2018 0313   KETONESUR 5 (A) 03/29/2018 0313   PROTEINUR NEGATIVE 03/29/2018 0313   NITRITE NEGATIVE 03/29/2018 0313   LEUKOCYTESUR NEGATIVE 03/29/2018 0313   Hgb A1c: 5.5  HIV Screen: Negative  C. Difficile Quick Scan:  Ref Range & Units 05:25  C Diff antigen NEGATIVE POSITIVEAbnormal    C Diff toxin NEGATIVE NEGATIVE   C Diff interpretation  Results are indeterminate. See PCR results.  C. Difficile PCR:  Ref Range & Units 05:25  Toxigenic C. Difficile by PCR NEGATIVE POSITIVEAbnormal    Comment: Positive for toxigenic C. difficile with little to no toxin production. Only treat if clinical presentation suggests symptomatic illness.    Admission EKG  EKG Interpretation  Date/Time:  Wednesday March 29 2018 02:52:46 EDT Ventricular Rate:  85 PR Interval:  130 QRS Duration: 92 QT Interval:  404 QTC Calculation: 480 R Axis:   74 Text Interpretation:  Normal sinus rhythm Prolonged QT Abnormal ECG Confirmed by [name] [phone]) on 03/29/2018 4:49:14 AM Also confirmed by [name] [phone]), editor [name] [phone])  on 03/29/2018 11:57:49 AM      DG Chest 2 view: IMPRESSION: 1. No active cardiopulmonary disease. 2. Right-sided Port-A-Cath with tip in the right atrium, stable.  CT Head: IMPRESSION: No acute intracranial abnormality.  MR  Brain: IMPRESSION: Negative brain MRI.  No explanation for symptoms.  Discharge Instructions: Discharge Instructions    Call MD for:  difficulty breathing, headache or visual disturbances   Complete by:  As directed    Call MD for:  persistant dizziness or light-headedness   Complete by:  As directed    Call MD for:  persistant nausea and vomiting   Complete by:  As directed    Call MD for:  temperature >100.4   Complete by:  As directed    Diet - low sodium heart healthy   Complete by:  As directed    Discharge instructions   Complete by:  As directed    Thank you for allowing [place] to care for you  Your symptoms of syncope are believed to have been due to a combination of your repeated bowl movements and anxiety. - Imaging of your head and brain were normal  While admitted, you tested positive for C. Diff - You have been prescribed a 10  day course of antibiotics - Please make sure to wash your hands with Soap and water  Please follow up with your primary doctor in the next 2 weeks  Please follow up with your Oncologist and Surgeon   Increase activity slowly   Complete by:  As directed       Signed: Neva Seat, MD 03/30/2018, 8:06 PM   Pager: [phone]

## 2018-03-30 NOTE — Progress Notes (Signed)
EEG was normal. Brain MRI was normal.   A/R: 47 year old male with 10 minute episode of altered sensorium and shivering movements 1. Overall semiology described in detail by girlfriend seems more consistent with a hypotensive or hypoglycemic episode than a seizure. Several aspects of the spell seem less consistent with seizure, including sudden onset and resolution, awake state during spell, no tongue biting, no incontinence, no evolution and no high amplitude jerking, posturing, lateralized motor activity or tonic phase. 2. Work up for possible infection. 3. Rehydrate.   Neurology will sign off. Please call if there are additional questions.   Electronically signed: Dr. Kerney Elbe

## 2018-03-30 NOTE — Progress Notes (Signed)
Subjective: Mr Dieguez was seen resting in his bed this morning. He denies any recurrence of synopal or presyncopal symptoms. He continues to have loose bowl movements. He was informed of his results showing positive C. Diff and that he would be receiving antibiotics as treatment. All questions were answered. Patient has no other complaints today.  Objective:  Vital signs in last 24 hours: Vitals:   03/29/18 1852 03/29/18 2152 03/30/18 0520 03/30/18 0925  BP: (!) 129/91 (!) 137/97 110/79 118/81  Pulse: 71 88 87 93  Resp: 18 20 19 20   Temp: 97.7 F (36.5 C) 97.6 F (36.4 C) 97.8 F (36.6 C) 98.3 F (36.8 C)  TempSrc: Oral Oral Oral Oral  SpO2: 98% 99% 98% 98%  Weight:  155 lb 1.1 oz (70.3 kg)    Height:       Physical Exam  Constitutional: He is oriented to person, place, and time. He appears well-developed and well-nourished.  HENT:  Head: Normocephalic and atraumatic.  Cardiovascular: Normal rate, regular rhythm, normal heart sounds and intact distal pulses.  Pulmonary/Chest: Effort normal and breath sounds normal. No respiratory distress.  Abdominal: Soft. Bowel sounds are normal. He exhibits no distension.  Musculoskeletal: He exhibits no edema or deformity.  Neurological: He is alert and oriented to person, place, and time.  Skin: Skin is warm and dry.   Assessment/Plan: 47 year old male with a history of Lynch syndrome and colorectal adenocarcinoma status post total proctocolectomy in December 2017 and chronic diarrhea presenting to the hospital for further evaluation of altered mental status / Syncope.    Situational Syncope: Resolved. Patient reports episode of syncope following a bowl movement while feeling anxious (in the setting of colectomy 2/2 rectal adenocarcinoma and chronic diarrhea). He went to his bed, lay down and had altered level of consciousness for about 50min per witnesses. He was reportedly cold and pale during this episode. Normotensive here with  orthostatic positive for only HR laying to sitting (without symptoms). No murmur on exam. No seizure-like activity. CT Head and MR Brain WNL. EEG normal. UDS Pending. - Will continue to monitor  C. Difficile Chronic diarrhea due to total colectomy: History of adenocarcinoma as below. Diverting loop ileostomy Dec 2017. Loop ileostomy taken down Oct 2018. Pouchoscopy with stricture dilation Jan 2019. Persitent diarrhea on high-dose paregoric. Followed by Surgery (Dr. Johney Maine): Plan for ileostomy takedown in May 2019. He reports persistent 20-30 BMs/day. C. Diff PCR positive for toxigenic strain with minimal toxin production (toxin screen negative. In the setting of loose stools in patient with complicated GI histroy and upcoming surgery.   - Appreciate surgery recommendations - Paregoric 10 mL TID AC & QHS - Paregoric 5-68mL q6h PRN, diarrhea or loose stools - Lomotil 1-2 tabs, QID PRN - Vancomycin 125mg  Oral Solution QID (Day 1 of 10)  - Stool Culture Pending  History of Lynch syndrome and rectal adenocarcinoma (distal rectum) with indeterminate lung nodules:  - Followed by oncology (Dr. Annamaria Boots): Lung nodules on CT suspicious for mets. PET (August 2017): small lung nodules, periaortic lymph node not hypermetabolic. Chemoradiation Aug-October 2017, then surgical resection, Chemotherapy Jan-June 2018. Ileostomy October 18. Last oncology office visit was in February 2019. - Oncology follow-up after discharge  Elevated blood glucose: Fasting blood glucose 256 and UA showing glucosuria. Family history of diabetes. - A1c 5.5 - Discontinue SSI  Hypokalemia: Resolved. K 3.4 on admission. Repleted with normal saline + potassium. - Repeat K 4.0  Anxiety: - Home Xanax 0.5mg  BID PRN  Dispo:  Anticipated discharge in approximately 0-1 day(s).   Neva Seat, MD 03/30/2018, 10:48 AM Pager: [phone]

## 2018-03-30 NOTE — Progress Notes (Signed)
Patient and family have both reviewed and understand AVS discharge information. Patient will be leaving with wife back home. No s/s of distress or discomfort.

## 2018-03-30 NOTE — Progress Notes (Signed)
Initial Nutrition Assessment  DOCUMENTATION CODES:   Non-severe (moderate) malnutrition in context of chronic illness  INTERVENTION:   - Decrease Ensure Enlive po to Q24, each supplement provides 350 kcal and 20 grams of protein  - Continue Ensure Surgery po BID, each supplement provides 330 kcal and 18 grams of protein  - Encourage PO intake  NUTRITION DIAGNOSIS:   Moderate Malnutrition related to chronic illness, cancer and cancer related treatments(rectal adenocarcinoma) as evidenced by severe muscle depletion, moderate muscle depletion, mild muscle depletion, moderate fat depletion.  GOAL:   Patient will meet greater than or equal to 90% of their needs  MONITOR:   PO intake, Supplement acceptance, Weight trends, Labs  REASON FOR ASSESSMENT:   Malnutrition Screening Tool    ASSESSMENT:   47 year old male who presented to ED after an episode of LOC. PMH significant for Lynch syndrome and rectal adenocarcinoma s/p proctocolectomy with ileoanal anastomosis and diverting loop ileostomy in 2017 and recurrent difficulties with diarrhea. Pt found to have indeterminate lung nodules in 2018. Pt had loop ileostomy taken down in October 2018 and pouchoscopy with coloanal stricture dilation in January 2019.  Spoke with pt at bedside who reports having a "great" appetite. Pt states that he usually eats throughout the day rather than eating three meals. Pt reports that he is unable to tolerate foods that are spicy, acidic, tough, or greasy. This includes pepper, spaghetti, steak, and fried food. Pt drinks water and [employer] at home. Pt was previously drinking Boost oral nutrition supplements during chemo.  Pt states he had a good appetite for breakfast and ate cheesy eggs, a biscuit, and frosted flakes. Pt denies any chewing or swallowing difficulties.  Pt endorses diarrhea PTA (20-25 episodes/day) and states he is now "doing better." Pt C. diff positive.  Pt reports UBW as 160 lbs  but that his weight has ranged from 130-185 lbs since cancer diagnosis and treatment. Per weight history in chart, pt has lost 6.6 lbs since October 2018. This is a 4.1% weight loss which not significant for timeframe. Pt's weight is up 12 lbs since July 2018.  Meal Completion: 100%  Medications reviewed and include: 325 mg ferrous sulfate BID, sliding scale Novolog, 1 packet psyllium BID, 250 mg Florastor BID  Labs reviewed: BUN <5 (L), calcium 7.8 (L) CBG's: 86, 96, 115, 106, 147 x 24 hours  NUTRITION - FOCUSED PHYSICAL EXAM:    Most Recent Value  Orbital Region  Moderate depletion  Upper Arm Region  Moderate depletion  Thoracic and Lumbar Region  Moderate depletion  Buccal Region  Moderate depletion  Temple Region  Moderate depletion  Clavicle Bone Region  Moderate depletion  Clavicle and Acromion Bone Region  Moderate depletion  Scapular Bone Region  Mild depletion  Dorsal Hand  Mild depletion  Patellar Region  Moderate depletion  Anterior Thigh Region  Moderate depletion  Posterior Calf Region  Severe depletion  Edema (RD Assessment)  None  Hair  Reviewed  Eyes  Reviewed  Mouth  Reviewed  Skin  Reviewed  Nails  Reviewed       Diet Order:  Diet regular Room service appropriate? Yes; Fluid consistency: Thin  EDUCATION NEEDS:   Education needs have been addressed  Skin:  Skin Assessment: Reviewed RN Assessment(moisture assocaited skin damage to anus)  Last BM:  03/29/18  Height:   Ht Readings from Last 1 Encounters:  03/29/18 6' (1.829 m)    Weight:   Wt Readings from Last 1 Encounters:  03/29/18  155 lb 1.1 oz (70.3 kg)    Ideal Body Weight:  80.9 kg  BMI:  Body mass index is 21.03 kg/m.  Estimated Nutritional Needs:   Kcal:  1900-2100 kcal/day (27-30 kcal/kg)  Protein:  85-100 grams/day  Fluid:  1.9-2.0 L/day    Gaynell Face, MS, RD, LDN Pager: [phone] Weekend/After Hours: [phone]

## 2018-03-30 NOTE — Progress Notes (Addendum)
[name]  1971-09-14 [id number]  Patient Care Team: Patient, No Pcp Per as PCP - General (General Practice) Michael Boston, MD as Consulting Physician (General Surgery) [name], Kirke Corin, MD as Consulting Physician (Gastroenterology) Kyung Rudd, MD as Consulting Physician (Radiation Oncology) Truitt Merle, MD as Consulting Physician (Oncology)  Pt feels good today 3 BMs yesterday.  No diarrhea today Tol PO Urinating fine Not dizzy  BP 118/81 (BP Location: Left Arm)   Pulse 93   Temp 98.3 F (36.8 C) (Oral)   Resp 20   Ht 6' (1.829 m)   Wt 155 lb 1.1 oz (70.3 kg)   SpO2 98%   BMI 21.03 kg/m    General: Pt awake/alert/oriented x4 in no major acute distress.  Smiling/calm Eyes: PERRL, normal EOM. Sclera nonicteric Neuro: CN II-XII intact w/o focal sensory/motor deficits. Lymph: No head/neck/groin lymphadenopathy Psych:  No delerium/psychosis/paranoia HENT: Normocephalic, Mucus membranes moist.  No thrush Neck: Supple, No tracheal deviation Chest: No pain.  Good respiratory excursion. CV:  Pulses intact.  Regular rhythm MS: Normal AROM mjr joints.  No obvious deformity Abdomen: Soft, Nondistended.  Nontender.  No incarcerated hernias. GU:  Desitin in place.  No incontinence. Ext:  SCDs BLE.  No significant edema.  No cyanosis Skin: No petechiae / purpura   Testing for C. Difficile positive for colonization in his ileal pouch.  Not high toxin production at this time but still struggle with diarrhea.  Some colonization can happen with an ileal pouch, but not typical.  No evidence of pouchitis on prior pouchoscopy.  He feels great and had only 3 bowel movements yesterday which is much better than usual.  Does not sound like worsening diarrhea problems to me.  I discussed with infectious disease, Dr. Tommy Medal.  We both feel this is a gray area, but it is not unreasonable to go ahead and do 10-day course of oral vancomycin in the hopes that could help correct his diarrhea.  While I  am skeptical this will work, I do not think it will harm & has the potential, as a last ditch effort, to salvage his ileoanal J pouch.  He actually looks calm and great right now as if he does not need anything done.  Somewhat of a roller coaster on his symptoms and coping.  I would not go to emergent resection.  Would like him to stabilize this admission.  Plan resection at [facility] so this can be done MIS/robotically given the anticipated severe adhesions from his pelvic radiation, low anterior resection, ileoanal J-pouch creation.  Will be a long case.  Less morbid if can be done minimally invasively/robotically as opposed to a large open incision.  Complex colorectal surgery done at [facility].  Robotic resection option not available at [facility] where he chose to go.  I noted we can try C. difficile treatment and see if that helps things calm down.  He did not seem in a rush himself to go to an ileostomy today.  Desitin for the perineal rash - he has been using A&D ointment at home - sometimes he's told me it helps & other times he is burning & miserable.  Get [facility] to see if they have any advice.  Glucose above 200 = diabetes.  Needs sliding scale & workup.  MRI & EEG negative reassuring.  No major deficits now per neurology guardedly reassuring as well.  ?Wonder if patient had panic attack with his anxiety issues, but certainly observation &  syncopal/seizure w/u  etc. Needed.    Patient wonders if it was all in his head.  Denies any aura auditory or visual.  Does not really remember anything happening.  Petit mal seizure?  Do not have any good data about overdose with paregoric since its only functioning regionally and should not have systemic effects, but I doubt that this is a toxicity or overdose from the paregoric.  He has been underusing.  No evidence of any renal failure with normal creatinine despite his malnutrition and severe diarrhea struggles.  Check orthostatics.   Consider stopping IV fluids and following him with p.o. only to see if he stays stable.  Fair albumin concerning for chronic malnutrition.    Good pre-albumin hopeful sign he is getting better.  Consider calorie counts, supplemental shakes, nutritional consultation.  Encourage smoking cessation.  He is made an effort to cut down markedly but cannot entirely quit.  Agree with nicotine patch.       Adin Hector, M.D., F.A.C.S. Gastrointestinal and Minimally Invasive Surgery [facility] [address] [place], [place] [address] [phone] Main / Paging    Patient Active Problem List   Diagnosis Date Noted  . 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 10/13/2016    Priority: Medium  . Rectal adenocarcinoma s/p protctocolectomy, IPAA "J" pouch 12/01/2016 08/06/2016    Priority: Medium  . C. difficile diarrhea 03/30/2018  . Situational syncope 03/30/2018  . Near syncope 03/30/2018  . Hyperglycemia 03/29/2018  . Tobacco abuse 03/29/2018  . Hypokalemia 03/29/2018  . Protein-calorie malnutrition, moderate ([place]) 03/29/2018  . Stricture of ileoanal anastomosis s/p dilitations 01/12/2018  . Muscle spasm 04/11/2017  . Encounter for antineoplastic chemotherapy 04/11/2017  . Hypomagnesemia 12/02/2016  . 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 Family history of colon cancer   . Anxiety 09/14/2016    Past Medical History:  Diagnosis Date  . Anxiety   . Colon cancer ([place]) 12/01/2016  . Depression   . Family history of colon cancer   . Poor dental hygiene     Past Surgical History:  Procedure Laterality Date  . COLON SURGERY    . EUS N/A 07/29/2016   Procedure: LOWER ENDOSCOPIC ULTRASOUND (EUS);  Surgeon: Milus Banister, MD;  Location: [facility];   Service: Endoscopy;  Laterality: N/A;  . ILEOSTOMY CLOSURE N/A 09/22/2017   Procedure: TAKEDOWN OF LOOP ILEOSTOMY;  Surgeon: Michael Boston, MD;  Location: [facility];  Service: General;  Laterality: N/A;  . PORTACATH PLACEMENT N/A 01/26/2017   Procedure: PLACEMENT OF PORT-A-CATH CENTRAL LINE WITH FLUOROSCOPY AND ULTRASOUND;  Surgeon: Michael Boston, MD;  Location: [facility];  Service: General;  Laterality: N/A;  . POUCHOSCOPY N/A 09/21/2017   Procedure: POUCHOSCOPY;  Surgeon: Leighton Ruff, MD;  Location: [facility];  Service: Endoscopy;  Laterality: N/A;  . POUCHOSCOPY N/A 01/12/2018   Procedure: POUCHOSCOPY WITH BIOPSIES;  Surgeon: Leighton Ruff, MD;  Location: [facility];  Service: Endoscopy;  Laterality: N/A;  Local  . PROCTOSCOPY N/A 12/01/2016   Procedure: RIGID PROCTOSCOPY;  Surgeon: Michael Boston, MD;  Location: [facility];  Service: General;  Laterality: N/A;  . TOE AMPUTATION Left   . XI ROBOTIC ASSISTED LOWER ANTERIOR RESECTION N/A 12/01/2016   Procedure: XI ROBOTIC ASSISTED PROCTOCOLECTOMY WITH ILLEOPOUCH ANASTAMOSIS WITH DIVERTING  Harmon Dun;  Surgeon: Michael Boston, MD;  Location: [facility];  Service: General;  Laterality: N/A;    Social History   Socioeconomic History  . Marital status: Divorced    Spouse name: Not on file  . Number of children: 3  . Years of education: Not on file  . Highest education level: Not on file  Occupational History  . Not on file  Social Needs  . Financial resource strain: Not on file  . Food insecurity:    Worry: Not on file    Inability: Not on file  . Transportation needs:    Medical: Not on file    Non-medical: Not on file  Tobacco Use  . Smoking status: Current Every Day Smoker    Packs/day: 1.00    Years: 32.00    Pack years: 32.00    Types: Cigarettes  . Smokeless tobacco: Never Used  Substance and Sexual Activity  . Alcohol use: No  . Drug use: No    Comment: patient denies  . Sexual activity: Yes  Lifestyle  .  Physical activity:    Days per week: Not on file    Minutes per session: Not on file  . Stress: Not on file  Relationships  . Social connections:    Talks on phone: Not on file    Gets together: Not on file    Attends religious service: Not on file    Active member of club or organization: Not on file    Attends meetings of clubs or organizations: Not on file    Relationship status: Not on file  . Intimate partner violence:    Fear of current or ex partner: Not on file    Emotionally abused: Not on file    Physically abused: Not on file    Forced sexual activity: Not on file  Other Topics Concern  . Not on file  Social History Narrative   Lives with significant other, [name]   Have a 60 year old son   Smoker      [place]    Family History  Problem Relation Age of Onset  . Diabetes Mother   . COPD Father   . Colon cancer Maternal Aunt        dx in her 51s  . Diabetes Maternal Grandmother   . Brain cancer Maternal Grandfather   . Lung cancer Paternal Grandfather   . Colon cancer Cousin        dx in her 39s  . Bone cancer Sister 8  . Pancreatic cancer Neg Hx   . Esophageal cancer Neg Hx   . Stomach cancer Neg Hx   . Liver disease Neg Hx     Current Facility-Administered Medications  Medication Dose Route Frequency Provider Last Rate Last Dose  . acetaminophen (TYLENOL) tablet 650 mg  650 mg Oral Q6H PRN Thomasene Ripple, MD   650 mg at 03/30/18 0859  . ALPRAZolam Duanne Moron) tablet 0.5 mg  0.5 mg Oral BID PRN Shela Leff, MD   0.5 mg at 03/29/18 2239  . alum & mag hydroxide-simeth (MAALOX/MYLANTA) 200-200-20 MG/5ML suspension 30 mL  30 mL Oral Q6H PRN Michael Boston, MD      . diphenhydrAMINE (BENADRYL) capsule 25 mg  25 mg Oral Q6H PRN Michael Boston, MD      . diphenhydrAMINE (BENADRYL) injection 12.5-25 mg  12.5-25 mg Intravenous Q6H PRN Michael Boston, MD      . diphenoxylate-atropine (LOMOTIL) 2.5-0.025 MG per tablet  1-2 tablet  1-2  tablet Oral QID PRN Michael Boston, MD   2 tablet at 03/29/18 1347  . enoxaparin (LOVENOX) injection 40 mg  40 mg Subcutaneous Q24H Shela Leff, MD   40 mg at 03/29/18 1251  . feeding supplement (ENSURE ENLIVE) (ENSURE ENLIVE) liquid 237 mL  237 mL Oral BID BM Axel Filler, MD      . feeding supplement (ENSURE SURGERY) liquid 237 mL  237 mL Oral BID BM Michael Boston, MD   237 mL at 03/29/18 1258  . ferrous sulfate tablet 325 mg  325 mg Oral BID WC Michael Boston, MD   325 mg at 03/30/18 0859  . gabapentin (NEURONTIN) capsule 300 mg  300 mg Oral BID Shela Leff, MD   300 mg at 03/30/18 0859  . guaiFENesin-dextromethorphan (ROBITUSSIN DM) 100-10 MG/5ML syrup 10 mL  10 mL Oral Q4H PRN Michael Boston, MD      . hydrocortisone (ANUSOL-HC) 2.5 % rectal cream 1 application  1 application Topical QID PRN Michael Boston, MD      . hydrocortisone cream 1 % 1 application  1 application Topical TID PRN Michael Boston, MD      . hydrocortisone-pramoxine [facility] [employer] [facility]) 2.5-1 % rectal cream 1 application  1 application Rectal S8N PRN Michael Boston, MD      . insulin aspart (novoLOG) injection 0-15 Units  0-15 Units Subcutaneous TID Carlyn Reichert Michael Boston, MD   2 Units at 03/29/18 1347  . insulin aspart (novoLOG) injection 0-5 Units  0-5 Units Subcutaneous QHS Michael Boston, MD      . lactated ringers bolus 1,000 mL  1,000 mL Intravenous Q8H PRN Michael Boston, MD      . lip balm (BLISTEX) ointment   Topical PRN Axel Filler, MD      . liver oil-zinc oxide (DESITIN) 40 % ointment   Topical BID Michael Boston, MD      . magic mouthwash  15 mL Oral QID PRN Michael Boston, MD      . menthol-cetylpyridinium (CEPACOL) lozenge 3 mg  1 lozenge Oral PRN Michael Boston, MD      . nicotine (NICODERM CQ - dosed in mg/24 hours) patch 14 mg  14 mg Transdermal Daily Shela Leff, MD   14 mg at 03/30/18 0859  . paregoric 2 MG/5ML solution 10 mL  10 mL Oral TID WC & HS Michael Boston, MD   10 mL at 03/29/18  2240  . paregoric 2 MG/5ML solution 5-10 mL  5-10 mL Oral Q6H PRN Michael Boston, MD      . phenol (CHLORASEPTIC) mouth spray 1-2 spray  1-2 spray Mouth/Throat PRN Michael Boston, MD      . pneumococcal 23 valent vaccine (PNU-IMMUNE) injection 0.5 mL  0.5 mL Intramuscular Tomorrow-1000 Evette Doffing Mallie Mussel, MD      . psyllium (HYDROCIL/METAMUCIL) packet 1 packet  1 packet Oral BID Michael Boston, MD   1 packet at 03/30/18 0900  . saccharomyces boulardii (FLORASTOR) capsule 250 mg  250 mg Oral BID Michael Boston, MD   250 mg at 03/30/18 0858  . vancomycin (VANCOCIN) 50 mg/mL oral solution 125 mg  125 mg Oral QID Neva Seat, MD      . witch hazel-glycerin (TUCKS) pad 1 application  1 application Topical PRN Crystina Borrayo, Remo Lipps, MD       Facility-Administered Medications Ordered in Other Encounters  Medication Dose Route Frequency Provider Last Rate Last Dose  . sodium chloride flush (NS) 0.9 % injection 10 mL  10 mL Intravenous PRN Truitt Merle, MD   10 mL at 11/18/17 0953     No Known Allergies  BP 118/81 (BP Location: Left Arm)   Pulse 93   Temp 98.3 F (36.8 C) (Oral)   Resp 20   Ht 6' (1.829 m)   Wt 155 lb 1.1 oz (70.3 kg)   SpO2 98%   BMI 21.03 kg/m   Dg Chest 2 View  Result Date: 03/29/2018 CLINICAL DATA:  Initial evaluation for acute syncope. EXAM: CHEST - 2 VIEW COMPARISON:  Prior radiograph from 01/26/2017. FINDINGS: Cardiac and mediastinal silhouettes are stable in size and contour. Right-sided Port-A-Cath with tip low lying in the right atrium again noted, stable. Lungs normally inflated. No focal infiltrates. No pulmonary edema or pleural effusion. No pneumothorax. No acute osseous abnormality. IMPRESSION: 1. No active cardiopulmonary disease. 2. Right-sided Port-A-Cath with tip in the right atrium, stable. Electronically Signed   By: Jeannine Boga M.D.   On: 03/29/2018 06:16   Ct Head Wo Contrast  Result Date: 03/29/2018 CLINICAL DATA:  Altered level of consciousness.  EXAM: CT HEAD WITHOUT CONTRAST TECHNIQUE: Contiguous axial images were obtained from the base of the skull through the vertex without intravenous contrast. COMPARISON:  None. FINDINGS: Brain: Right basal gangliar calcifications, typically incidental. No intracranial hemorrhage, mass effect, or midline shift. No hydrocephalus. The basilar cisterns are patent. No evidence of territorial infarct or acute ischemia. No extra-axial or intracranial fluid collection. Vascular: No hyperdense vessel or unexpected calcification. Skull: No fracture or focal lesion. Sinuses/Orbits: Mild mucosal thickening of right side of sphenoid sinus and scattered ethmoid air cells. No sinus fluid levels. Mastoid air cells are clear. Visualized orbits are unremarkable. Other: None. IMPRESSION: No acute intracranial abnormality. Electronically Signed   By: Jeb Levering M.D.   On: 03/29/2018 05:49   Mr Jeri Cos And Wo Contrast  Result Date: 03/29/2018 CLINICAL DATA:  Altered level of consciousness. History of colon cancer. EXAM: MRI HEAD WITHOUT AND WITH CONTRAST TECHNIQUE: Multiplanar, multiecho pulse sequences of the brain and surrounding structures were obtained without and with intravenous contrast. CONTRAST:  41m MULTIHANCE GADOBENATE DIMEGLUMINE 529 MG/ML IV SOLN COMPARISON:  03/29/2018 FINDINGS: Brain: No acute infarction, hemorrhage, hydrocephalus, extra-axial collection or mass lesion. No significant white matter disease and no atrophy. Symmetric normal hippocampus appearance. Vascular: Flow voids and vascular enhancements are preserved Skull and upper cervical spine: No evidence of marrow lesion. Sinuses/Orbits: Negative. IMPRESSION: Negative brain MRI.  No explanation for symptoms. Electronically Signed   By: JMonte FantasiaM.D.   On: 03/29/2018 08:34    Note: This dictation was prepared with Dragon/digital dictation along with [employer] Any transcriptional errors that result from this process are  unintentional.   .SAdin Hector M.D., F.A.C.S. Gastrointestinal and Minimally Invasive Surgery [facility] [address] [place] [phone] / Paging  03/30/2018 11:26 AM

## 2018-04-03 LAB — STOOL CULTURE REFLEX - RSASHR

## 2018-04-03 LAB — STOOL CULTURE: E coli, Shiga toxin Assay: NEGATIVE

## 2018-04-03 LAB — STOOL CULTURE REFLEX - CMPCXR

## 2018-04-12 MED FILL — PAREGORIC LIQUID: 2 | 11 days supply | Qty: 473 | Fill #2

## 2018-04-21 ENCOUNTER — Encounter: Payer: Self-pay | Admitting: Family Medicine

## 2018-04-21 ENCOUNTER — Encounter: Payer: Self-pay | Admitting: Licensed Clinical Social Worker

## 2018-04-21 ENCOUNTER — Other Ambulatory Visit: Payer: Self-pay

## 2018-04-21 ENCOUNTER — Ambulatory Visit (INDEPENDENT_AMBULATORY_CARE_PROVIDER_SITE_OTHER): Payer: Medicaid Other | Admitting: Family Medicine

## 2018-04-21 VITALS — BP 106/60 | HR 66 | Temp 98.1°F | Ht 72.0 in | Wt 152.0 lb

## 2018-04-21 DIAGNOSIS — F320 Major depressive disorder, single episode, mild: Secondary | ICD-10-CM | POA: Diagnosis not present

## 2018-04-21 DIAGNOSIS — F419 Anxiety disorder, unspecified: Secondary | ICD-10-CM

## 2018-04-21 DIAGNOSIS — R197 Diarrhea, unspecified: Secondary | ICD-10-CM | POA: Diagnosis not present

## 2018-04-21 DIAGNOSIS — Z720 Tobacco use: Secondary | ICD-10-CM | POA: Diagnosis not present

## 2018-04-21 DIAGNOSIS — C20 Malignant neoplasm of rectum: Secondary | ICD-10-CM

## 2018-04-21 MED ORDER — ZINC OXIDE 40 % EX OINT: 1.0000 "application " | TOPICAL_OINTMENT | CUTANEOUS | 0 refills | Status: DC | PRN

## 2018-04-21 MED ORDER — ALPRAZOLAM 0.5 MG PO TABS: 0.5000 mg | ORAL_TABLET | Freq: Two times a day (BID) | ORAL | 0 refills | Status: DC | PRN

## 2018-04-21 MED ORDER — SERTRALINE HCL 50 MG PO TABS: 50.0000 mg | ORAL_TABLET | Freq: Every day | ORAL | 3 refills | Status: DC

## 2018-04-21 MED FILL — PAREGORIC LIQUID: 2 | 11 days supply | Qty: 473 | Fill #3

## 2018-04-21 NOTE — Patient Instructions (Addendum)
It was great to meet you today! Thank you for letting me participate in your care!  Today, we discussed depression and anxiety. 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 I have sent a prescription for Sertraline to your pharmacy. Please take it as prescribed. This will hopefully also help control and decrease your panic attacks.   I have also written you a prescription for Xanax to immediately stop panic attacks when they occur. Please only take these as directed.  I have written you a cream to use to help from the irritation from the diarrhea. Please use as needed and apply to the affected area.  Be well, Harolyn Rutherford, DO PGY-1, [facility]

## 2018-04-21 NOTE — Progress Notes (Signed)
ESTIMATE TIME:20 minutes Type of Service: [place] warm handoff  Interpreter:No.   [name] is a 47 y.o. male referred by Dr. Garlan Fillers for symptoms of  depression a well as health related stressors.  Patient to start psychotropic medications today ( Zoloft and xanax). Patient is pleasant and engaged in converation , was accompanied by his male friend of 13 years Tour manager). Reports : feeling sad and thought that he would be better off not here. Patient denies acting on thoughts states "I love me too much to do that".  Duration of problem: past few months; cancer diagnosis 2 years ago. Current /Hx of mental health treatment & substance use: no prior mental health treatment. No prior attempts;  Appearance:Neat ; Thought process: Coherent; Affect: Appropriate : No plan to harm self or others GOALS: Patient will reduce symptoms of: depression, and increase  ability UV:OZDGUY skills and self-management skills, Increase healthy adjustment to current life circumstances. INTERVENTION:  Supportive Counseling, Reflective listening, Behavioral Therapy (Relaxed breathing), Psychoeducation and Brief CBT.   ISSUES DISCUSSED: Integrated care services, support system, previous and current coping skills,  Demonstration of relaxed breathing and review of safety plan (Suicidal Feelings How to Help Yourself) ASSESSMENT:Patient currently experiencing symptoms of depression.  Symptoms exacerbated by chronic health concerns. Patient may benefit from, and is in agreement to receive further assessment and brief therapeutic interventions to assist with managing his symptoms.  PLAN:   1.Patient will F/U with LCSW in one week  2. Behavioral recommendations: relaxed breathing; Brief CBT  3. Referral:none at this time    Warm Hand Off Completed.     Casimer Lanius, LCSW Licensed Clinical Social Worker [place]   [phone] 4:35 PM

## 2018-04-21 NOTE — Progress Notes (Signed)
Subjective: Chief Complaint  Patient presents with  . New Patient (Initial Visit)    has surgery scheduled 5/22, needs AEX  . Depression    would like rx Xanax, PHQ9 done in EPIC   . irritation around anus    would like rx for skin irritation from frequent BM's   HPI: [name] is a 47 y.o. presenting to clinic today to discuss the following:  Establish Care [name] presents to establish care with our clinic and states he has never had a PCP before. He states he never went to a doctor on a regular basis because he "just never did that growing up". He has a PMH of adenocarcinoma s/p protctocolectomy, Lynch syndrome, anxiety, and tobacco use disorder. He is presenting for care now due to a past diagnosis of colorectal cancer that required a colectomy with a colostomy. The colostomy was reversed (about 5 months ago) and since that time Tyrone. [name] has had significant diarrhea; 20-30 times per day. He is being followed by surgery for management of this issue. He complains of significant skin irritation and "being raw" from the diarrhea.   Depression/Anxiety He also states since his diagnosis he has been struggling with feelings of hopelessness, depressed, increased sleeping (won't get out of bed all day for some days), loss of interest, and fatigue or low energy. He has been having these symptoms for over the past 6 months. He has thought about suicide but denies any active plan or any intention to harm self or others. He is also having what he describes as episodes where he can't "calm down" exhibited by restlessness, inability to sleep, feeling "like the walls are closing in on me" increased breathing and heart rate.  PHQ-9 score today was 18.   Health Maintenance: None today given initial visit    ROS noted in HPI.   Past Medical, Surgical, Social, and Family History Reviewed & Updated per EMR.   Pertinent Historical Findings include:   Social History   Tobacco Use  Smoking  Status Current Every Day Smoker  . Packs/day: 1.00  . Years: 32.00  . Pack years: 32.00  . Types: Cigarettes  Smokeless Tobacco Never Used    Objective: BP 106/60 (BP Location: Right Arm, Patient Position: Sitting, Cuff Size: Normal)   Pulse 66   Temp 98.1 F (36.7 C) (Oral)   Ht 6' (1.829 m)   Wt 152 lb (68.9 kg)   SpO2 97%   BMI 20.61 kg/m  Vitals and nursing notes reviewed  Physical Exam  Constitutional: He is oriented to person, place, and time. He appears well-developed and well-nourished. No distress.  HENT:  Head: Normocephalic and atraumatic.  Right Ear: External ear normal.  Left Ear: External ear normal.  Eyes: Pupils are equal, round, and reactive to light. Conjunctivae and EOM are normal.  Neck: Normal range of motion. Neck supple. No thyromegaly present.  Cardiovascular: Normal rate, regular rhythm, normal heart sounds and intact distal pulses.  No murmur heard. Pulmonary/Chest: Effort normal and breath sounds normal. No respiratory distress. He has no wheezes. He has no rales.  Abdominal: Soft. Bowel sounds are normal. He exhibits no distension and no mass. There is no tenderness. There is no guarding.  Scar in RLQ from previous colostomy  Musculoskeletal: Normal range of motion. He exhibits no edema.  Lymphadenopathy:    He has no cervical adenopathy.  Neurological: He is alert and oriented to person, place, and time.  Skin: Skin  is warm and dry. Capillary refill takes less than 2 seconds. No rash noted. No erythema.   No results found for this or any previous visit (from the past 72 hour(s)).  Assessment/Plan:  Tobacco abuse Encouraged patient to stop and provided information on 1-800-QUITNOW.   Anxiety Patient describes what is most likely panic attacks most likely secondary to recent diagnosis of adenocarcinoma of the colon and rectum.   Started him on Sertraline 50mg  daily. Also have integrated behavioral health to see patient today and I encouraged  him to use them as a resource to provide alternative methods for dealing with stress.  If not improvement with medication, consider alternative etiologies such as thyroid disease or carcinoid syndrome.  Current mild episode of major depressive disorder without prior episode [facility]) Patient meets criteria for major depression with 6 features required out of the DSM 5. His PHGQ-9 score was 18 also further supporting the diagnosis of depression. He has no guns in the home and has good family support. Denies any active suicidal thoughts or plan but has considered taking his own life.  Sertraline 50mg  daily and will titrate dose up if symptoms are not improved. Follow up in 3 weeks.  Diarrhea Secondary to proctocolectomy. Patient is having colostomy redone in a few weeks and will be permanent. This is the definitive treatment for his diarrhea.  Prescribed Desitin ointment for now to help with chaffing and rash.   PATIENT EDUCATION PROVIDED: See AVS    Diagnosis and plan along with any newly prescribed medication(s) were discussed in detail with this patient today. The patient verbalized understanding and agreed with the plan. Patient advised if symptoms worsen return to clinic or ER.   Health Maintainance:   No orders of the defined types were placed in this encounter.   Meds ordered this encounter  Medications  . sertraline (ZOLOFT) 50 MG tablet    Sig: Take 1 tablet (50 mg total) by mouth daily.    Dispense:  30 tablet    Refill:  3  . ALPRAZolam (XANAX) 0.5 MG tablet    Sig: Take 1 tablet (0.5 mg total) by mouth 2 (two) times daily as needed for anxiety or sleep.    Dispense:  15 tablet    Refill:  0  . liver oil-zinc oxide (DESITIN) 40 % ointment    Sig: Apply 1 application topically as needed for irritation.    Dispense:  56.7 g    Refill:  0    Harolyn Rutherford, DO 04/24/2018, 12:18 PM PGY-1, [place]

## 2018-04-24 DIAGNOSIS — F320 Major depressive disorder, single episode, mild: Secondary | ICD-10-CM | POA: Insufficient documentation

## 2018-04-24 DIAGNOSIS — R197 Diarrhea, unspecified: Secondary | ICD-10-CM | POA: Insufficient documentation

## 2018-04-24 NOTE — Assessment & Plan Note (Addendum)
Secondary to proctocolectomy. Patient is having colostomy redone in a few weeks and will be permanent. This is the definitive treatment for his diarrhea.  Prescribed Desitin ointment for now to help with chaffing and rash.

## 2018-04-24 NOTE — Assessment & Plan Note (Addendum)
Patient describes what is most likely panic attacks most likely secondary to recent diagnosis of adenocarcinoma of the colon and rectum.   Started him on Sertraline 50mg  daily. Also have integrated behavioral health to see patient today and I encouraged him to use them as a resource to provide alternative methods for dealing with stress.  Prescribed Xanax 0.5mg  tabs to abort panic attacks. Educated patient about the risk of taking them and to only use when necessary. Should not need them after he has repeat colostomy to resolve diarrhea.  If not improvement with medication, consider alternative etiologies such as thyroid disease or carcinoid syndrome.

## 2018-04-24 NOTE — Assessment & Plan Note (Signed)
Encouraged patient to stop and provided information on 1-800-QUITNOW.

## 2018-04-24 NOTE — Assessment & Plan Note (Signed)
Patient meets criteria for major depression with 6 features required out of the DSM 5. His PHGQ-9 score was 18 also further supporting the diagnosis of depression. He has no guns in the home and has good family support. Denies any active suicidal thoughts or plan but has considered taking his own life.  Sertraline 50mg  daily and will titrate dose up if symptoms are not improved. Follow up in 3 weeks.

## 2018-04-26 ENCOUNTER — Ambulatory Visit: Payer: Medicaid Other

## 2018-04-27 ENCOUNTER — Ambulatory Visit: Payer: Medicaid Other | Admitting: Licensed Clinical Social Worker

## 2018-05-01 MED FILL — PAREGORIC LIQUID: 2 | 11 days supply | Qty: 473 | Fill #4

## 2018-05-01 NOTE — Progress Notes (Signed)
03/29/18-ekg-epic  cxr-epic-negative

## 2018-05-01 NOTE — Patient Instructions (Addendum)
[name]  05/01/2018   Your procedure is scheduled on: 05/10/2018   Report to [facility] Main  Entrance              Report to admitting at    [place] AM    Call this number if you have problems the morning of surgery [phone]    Remember: Do not eat food  :After Midnight. Clear liquid diet the day before surgery.              Follow Bowel Prep per MD .    DRINK 2 PRESURGERY ENSURE DRINKS THE NIGHT BEFORE SURGERY AT  1000 PM AND 1 PRESURGERY DRINK THE DAY OF THE PROCEDURE 3 HOURS PRIOR TO SCHEDULED SURGERY. NO SOLIDS AFTER MIDNIGHT THE DAY PRIOR TO THE SURGERY. NOTHING BY MOUTH EXCEPT CLEAR LIQUIDS UNTIL THREE HOURS PRIOR TO SCHEDULED SURGERY. PLEASE FINISH PRESURGERY ENSURE DRINK PER SURGEON ORDER 3 HOURS PRIOR TO SCHEDULED SURGERY TIME WHICH NEEDS TO BE COMPLETED AT   0845am    CLEAR LIQUID DIET   Foods Allowed                                                                     Foods Excluded  Coffee and tea, regular and decaf                             liquids that you cannot  Plain Jell-O in any flavor                                             see through such as: Fruit ices (not with fruit pulp)                                     milk, soups, orange juice  Iced Popsicles                                    All solid food Carbonated beverages, regular and diet                                    Cranberry, grape and apple juices Sports drinks like Gatorade Lightly seasoned clear broth or consume(fat free) Sugar, honey syrup  Sample Menu Breakfast                                Lunch                                     Supper Cranberry juice  Beef broth                            Chicken broth Jell-O                                     Grape juice                           Apple juice Coffee or tea                        Jell-O                                      Popsicle                                                Coffee  or tea                        Coffee or tea  _____________________________________________________________________    Take these medicines the morning of surgery with A SIP OF WATER: Xanax if needed, Gabapentin, Zoloft                                 You may not have any metal on your body including hair pins and              piercings  Do not wear jewelry, , lotions, powders or perfumes, deodorant                       Men may shave face and neck.   Do not bring valuables to the hospital. [place].  Contacts, dentures or bridgework may not be worn into surgery.  Leave suitcase in the car. After surgery it may be brought to your room.                     Please read over the following fact sheets you were given: _____________________________________________________________________             [facility] - Preparing for Surgery Before surgery, you can play an important role.  Because skin is not sterile, your skin needs to be as free of germs as possible.  You can reduce the number of germs on your skin by washing with CHG (chlorahexidine gluconate) soap before surgery.  CHG is an antiseptic cleaner which kills germs and bonds with the skin to continue killing germs even after washing. Please DO NOT use if you have an allergy to CHG or antibacterial soaps.  If your skin becomes reddened/irritated stop using the CHG and inform your nurse when you arrive at Short Stay. Do not shave (including legs and underarms) for at least 48 hours prior to the first CHG shower.  You may shave your face/neck. Please follow these instructions carefully:  1.  Shower with CHG  Soap the night before surgery and the  morning of Surgery.  2.  If you choose to wash your hair, wash your hair first as usual with your  normal  shampoo.  3.  After you shampoo, rinse your hair and body thoroughly to remove the  shampoo.                           4.  Use CHG as  you would any other liquid soap.  You can apply chg directly  to the skin and wash                       Gently with a scrungie or clean washcloth.  5.  Apply the CHG Soap to your body ONLY FROM THE NECK DOWN.   Do not use on face/ open                           Wound or open sores. Avoid contact with eyes, ears mouth and genitals (private parts).                       Wash face,  Genitals (private parts) with your normal soap.             6.  Wash thoroughly, paying special attention to the area where your surgery  will be performed.  7.  Thoroughly rinse your body with warm water from the neck down.  8.  DO NOT shower/wash with your normal soap after using and rinsing off  the CHG Soap.                9.  Pat yourself dry with a clean towel.            10.  Wear clean pajamas.            11.  Place clean sheets on your bed the night of your first shower and do not  sleep with pets. Day of Surgery : Do not apply any lotions/deodorants the morning of surgery.  Please wear clean clothes to the hospital/surgery center.  FAILURE TO FOLLOW THESE INSTRUCTIONS MAY RESULT IN THE CANCELLATION OF YOUR SURGERY PATIENT SIGNATURE_________________________________  NURSE SIGNATURE__________________________________  ________________________________________________________________________  WHAT IS A BLOOD TRANSFUSION? Blood Transfusion Information  A transfusion is the replacement of blood or some of its parts. Blood is made up of multiple cells which provide different functions.  Red blood cells carry oxygen and are used for blood loss replacement.  White blood cells fight against infection.  Platelets control bleeding.  Plasma helps clot blood.  Other blood products are available for specialized needs, such as hemophilia or other clotting disorders. BEFORE THE TRANSFUSION  Who gives blood for transfusions?   Healthy volunteers who are fully evaluated to make sure their blood is safe. This is  blood bank blood. Transfusion therapy is the safest it has ever been in the practice of medicine. Before blood is taken from a donor, a complete history is taken to make sure that person has no history of diseases nor engages in risky social behavior (examples are intravenous drug use or sexual activity with multiple partners). The donor's travel history is screened to minimize risk of transmitting infections, such as malaria. The donated blood is tested for signs of infectious diseases, such as HIV and hepatitis. The blood is then tested to be sure  it is compatible with you in order to minimize the chance of a transfusion reaction. If you or a relative donates blood, this is often done in anticipation of surgery and is not appropriate for emergency situations. It takes many days to process the donated blood. RISKS AND COMPLICATIONS Although transfusion therapy is very safe and saves many lives, the main dangers of transfusion include:   Getting an infectious disease.  Developing a transfusion reaction. This is an allergic reaction to something in the blood you were given. Every precaution is taken to prevent this. The decision to have a blood transfusion has been considered carefully by your caregiver before blood is given. Blood is not given unless the benefits outweigh the risks. AFTER THE TRANSFUSION  Right after receiving a blood transfusion, you will usually feel much better and more energetic. This is especially true if your red blood cells have gotten low (anemic). The transfusion raises the level of the red blood cells which carry oxygen, and this usually causes an energy increase.  The nurse administering the transfusion will monitor you carefully for complications. HOME CARE INSTRUCTIONS  No special instructions are needed after a transfusion. You may find your energy is better. Speak with your caregiver about any limitations on activity for underlying diseases you may have. SEEK MEDICAL  CARE IF:   Your condition is not improving after your transfusion.  You develop redness or irritation at the intravenous (IV) site. SEEK IMMEDIATE MEDICAL CARE IF:  Any of the following symptoms occur over the next 12 hours:  Shaking chills.  You have a temperature by mouth above 102 F (38.9 C), not controlled by medicine.  Chest, back, or muscle pain.  People around you feel you are not acting correctly or are confused.  Shortness of breath or difficulty breathing.  Dizziness and fainting.  You get a rash or develop hives.  You have a decrease in urine output.  Your urine turns a dark color or changes to pink, red, or brown. Any of the following symptoms occur over the next 10 days:  You have a temperature by mouth above 102 F (38.9 C), not controlled by medicine.  Shortness of breath.  Weakness after normal activity.  The white part of the eye turns yellow (jaundice).  You have a decrease in the amount of urine or are urinating less often.  Your urine turns a dark color or changes to pink, red, or brown. Document Released: 12/03/2000 Document Revised: 02/28/2012 Document Reviewed: 07/22/2008 [facility] Patient Information 2014 [place], [place].  _______________________________________________________________________

## 2018-05-02 ENCOUNTER — Encounter (HOSPITAL_COMMUNITY): Payer: Self-pay

## 2018-05-02 ENCOUNTER — Other Ambulatory Visit: Payer: Self-pay

## 2018-05-02 ENCOUNTER — Encounter (HOSPITAL_COMMUNITY)
Admission: RE | Admit: 2018-05-02 | Discharge: 2018-05-02 | Disposition: A | Discharge status: Home / Self Care | Payer: Medicaid Other | Source: Ambulatory Visit | Attending: Surgery | Admitting: Surgery

## 2018-05-02 DIAGNOSIS — R32 Unspecified urinary incontinence: Secondary | ICD-10-CM | POA: Insufficient documentation

## 2018-05-02 DIAGNOSIS — R52 Pain, unspecified: Secondary | ICD-10-CM | POA: Diagnosis not present

## 2018-05-02 DIAGNOSIS — Z01812 Encounter for preprocedural laboratory examination: Secondary | ICD-10-CM | POA: Diagnosis present

## 2018-05-02 LAB — BASIC METABOLIC PANEL
Anion gap: 3 — ABNORMAL LOW (ref 5–15)
BUN: 7 mg/dL (ref 6–20)
CHLORIDE: 116 mmol/L — AB (ref 101–111)
CO2: 24 mmol/L (ref 22–32)
Calcium: 8.3 mg/dL — ABNORMAL LOW (ref 8.9–10.3)
Creatinine, Ser: 0.91 mg/dL (ref 0.61–1.24)
GFR calc non Af Amer: >60 mL/min (ref >60)
Glucose, Bld: 94 mg/dL (ref 65–99)
Potassium: 4.1 mmol/L (ref 3.5–5.1)
SODIUM: 143 mmol/L (ref 135–145)

## 2018-05-02 LAB — CBC
HEMATOCRIT: 47.4 % (ref 39.0–52.0)
Hemoglobin: 16.4 g/dL (ref 13.0–17.0)
MCH: 30.6 pg (ref 26.0–34.0)
MCHC: 34.6 g/dL (ref 30.0–36.0)
MCV: 88.4 fL (ref 78.0–100.0)
Platelets: 192 10*3/uL (ref 150–400)
RBC: 5.36 MIL/uL (ref 4.22–5.81)
RDW: 14.3 % (ref 11.5–15.5)
WBC: 8.8 10*3/uL (ref 4.0–10.5)

## 2018-05-02 NOTE — Consult Note (Signed)
[place] Nurse requested for preoperative stoma site marking.  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  Discussed some issues he had early on with leakage.  Discussed my role to mark him in an optimal site for best possible pouching.    Discussed surgical procedure and stoma creation with patient and family.  Explained role of the [place] nurse team.  Provided the patient with educational booklet and provided samples of pouching options.  Answered patient and family questions.   Examined patient lying, sitting, and standing in order to place the marking in the patient's visual field, away from any creases or abdominal contour issues and within the rectus muscle.    Marked for ileostomy in the RUQ  4 cm to the right of the umbilicus and 6  cm abovethe umbilicus. Deep creasing from previous surgical scar.    Patient's abdomen cleansed with CHG wipes at site markings, allowed to air dry prior to marking.Covered mark with thin film transparent dressing to preserve mark until date of surgery.   [place] Nurse team will follow up with patient after surgery for continue ostomy care and teaching.   Domenic Moras RN BSN [place] Pager [phone]

## 2018-05-09 MED ORDER — BUPIVACAINE LIPOSOME 1.3 % IJ SUSP
20.0000 mL | INTRAMUSCULAR | Status: DC
  Filled 2018-05-09 (×2): qty 20

## 2018-05-10 ENCOUNTER — Encounter (HOSPITAL_COMMUNITY)
Admission: RE | Disposition: A | Discharge status: Home / Self Care | Payer: Self-pay | Source: Ambulatory Visit | Attending: Surgery

## 2018-05-10 ENCOUNTER — Encounter (HOSPITAL_COMMUNITY): Payer: Self-pay | Admitting: *Deleted

## 2018-05-10 ENCOUNTER — Inpatient Hospital Stay (HOSPITAL_COMMUNITY): Payer: Medicaid Other | Admitting: Anesthesiology

## 2018-05-10 ENCOUNTER — Other Ambulatory Visit: Payer: Self-pay

## 2018-05-10 ENCOUNTER — Inpatient Hospital Stay (HOSPITAL_COMMUNITY)
Admission: RE | Admit: 2018-05-10 | Discharge: 2018-05-14 | DRG: 331 | Disposition: A | Discharge status: Home / Self Care | Payer: Medicaid Other | Source: Ambulatory Visit | Attending: Surgery | Admitting: Surgery

## 2018-05-10 DIAGNOSIS — F17210 Nicotine dependence, cigarettes, uncomplicated: Secondary | ICD-10-CM | POA: Diagnosis present

## 2018-05-10 DIAGNOSIS — Z9049 Acquired absence of other specified parts of digestive tract: Secondary | ICD-10-CM

## 2018-05-10 DIAGNOSIS — R152 Fecal urgency: Secondary | ICD-10-CM | POA: Diagnosis present

## 2018-05-10 DIAGNOSIS — N3941 Urge incontinence: Secondary | ICD-10-CM | POA: Diagnosis present

## 2018-05-10 DIAGNOSIS — R159 Full incontinence of feces: Principal | ICD-10-CM | POA: Diagnosis present

## 2018-05-10 DIAGNOSIS — Z9221 Personal history of antineoplastic chemotherapy: Secondary | ICD-10-CM | POA: Diagnosis not present

## 2018-05-10 DIAGNOSIS — Z1509 Genetic susceptibility to other malignant neoplasm: Secondary | ICD-10-CM | POA: Diagnosis not present

## 2018-05-10 DIAGNOSIS — Z720 Tobacco use: Secondary | ICD-10-CM | POA: Diagnosis present

## 2018-05-10 DIAGNOSIS — K529 Noninfective gastroenteritis and colitis, unspecified: Secondary | ICD-10-CM | POA: Diagnosis present

## 2018-05-10 DIAGNOSIS — C20 Malignant neoplasm of rectum: Secondary | ICD-10-CM | POA: Diagnosis present

## 2018-05-10 DIAGNOSIS — F419 Anxiety disorder, unspecified: Secondary | ICD-10-CM | POA: Diagnosis present

## 2018-05-10 DIAGNOSIS — F411 Generalized anxiety disorder: Secondary | ICD-10-CM | POA: Diagnosis present

## 2018-05-10 DIAGNOSIS — Z932 Ileostomy status: Secondary | ICD-10-CM

## 2018-05-10 DIAGNOSIS — Z85048 Personal history of other malignant neoplasm of rectum, rectosigmoid junction, and anus: Secondary | ICD-10-CM | POA: Diagnosis not present

## 2018-05-10 HISTORY — DX: Stenosis of anus and rectum: K62.4

## 2018-05-10 HISTORY — DX: Enterocolitis due to Clostridium difficile, not specified as recurrent: A04.72

## 2018-05-10 HISTORY — DX: Ileostomy status: Z93.2

## 2018-05-10 HISTORY — DX: Genetic susceptibility to other malignant neoplasm: Z15.09

## 2018-05-10 HISTORY — PX: XI ROBOTIC ASSISTED LOWER ANTERIOR RESECTION: SHX6558

## 2018-05-10 HISTORY — DX: Malignant neoplasm of rectum: C20

## 2018-05-10 HISTORY — PX: LYSIS OF ADHESION: SHX5961

## 2018-05-10 HISTORY — PX: RECTAL EXAM UNDER ANESTHESIA: SHX6399

## 2018-05-10 LAB — TYPE AND SCREEN
ABO/RH(D): A POS
Antibody Screen: NEGATIVE

## 2018-05-10 SURGERY — RESECTION, RECTUM, LOW ANTERIOR, ROBOT-ASSISTED
Anesthesia: General

## 2018-05-10 MED ORDER — MIDAZOLAM HCL 2 MG/2ML IJ SOLN
INTRAMUSCULAR | Status: AC
  Filled 2018-05-10: qty 2

## 2018-05-10 MED ORDER — OXYCODONE HCL 5 MG PO TABS: 5.0000 mg | ORAL_TABLET | Freq: Four times a day (QID) | ORAL | 0 refills | Status: DC | PRN

## 2018-05-10 MED ORDER — CELECOXIB 200 MG PO CAPS
200.0000 mg | ORAL_CAPSULE | ORAL | Status: AC
  Administered 2018-05-10: 200 mg via ORAL
  Filled 2018-05-10: qty 1

## 2018-05-10 MED ORDER — GABAPENTIN 300 MG PO CAPS
300.0000 mg | ORAL_CAPSULE | ORAL | Status: AC
  Administered 2018-05-10: 300 mg via ORAL
  Filled 2018-05-10: qty 1

## 2018-05-10 MED ORDER — ACETAMINOPHEN 500 MG PO TABS
1000.0000 mg | ORAL_TABLET | ORAL | Status: AC
  Administered 2018-05-10: 1000 mg via ORAL
  Filled 2018-05-10: qty 2

## 2018-05-10 MED ORDER — LACTATED RINGERS IV SOLN
INTRAVENOUS | Status: DC
  Administered 2018-05-10 (×3): via INTRAVENOUS

## 2018-05-10 MED ORDER — SERTRALINE HCL 50 MG PO TABS
50.0000 mg | ORAL_TABLET | Freq: Every day | ORAL | Status: DC
  Administered 2018-05-10 – 2018-05-14 (×5): 50 mg via ORAL
  Filled 2018-05-10 (×5): qty 1

## 2018-05-10 MED ORDER — ENOXAPARIN SODIUM 40 MG/0.4ML ~~LOC~~ SOLN
40.0000 mg | SUBCUTANEOUS | Status: DC
  Administered 2018-05-11 – 2018-05-14 (×4): 40 mg via SUBCUTANEOUS
  Filled 2018-05-10 (×4): qty 0.4

## 2018-05-10 MED ORDER — METHOCARBAMOL 1000 MG/10ML IJ SOLN
1000.0000 mg | Freq: Four times a day (QID) | INTRAMUSCULAR | Status: DC | PRN
  Filled 2018-05-10 (×2): qty 10

## 2018-05-10 MED ORDER — ALVIMOPAN 12 MG PO CAPS
12.0000 mg | ORAL_CAPSULE | Freq: Two times a day (BID) | ORAL | Status: DC
  Administered 2018-05-11 (×2): 12 mg via ORAL
  Filled 2018-05-10 (×4): qty 1

## 2018-05-10 MED ORDER — SUGAMMADEX SODIUM 200 MG/2ML IV SOLN
INTRAVENOUS | Status: DC | PRN
  Administered 2018-05-10: 200 mg via INTRAVENOUS

## 2018-05-10 MED ORDER — HYDROCORTISONE 1 % EX CREA: 1.0000 "application " | TOPICAL_CREAM | Freq: Three times a day (TID) | CUTANEOUS | Status: DC | PRN

## 2018-05-10 MED ORDER — BUPIVACAINE LIPOSOME 1.3 % IJ SUSP
INTRAMUSCULAR | Status: DC | PRN
  Administered 2018-05-10: 20 mL

## 2018-05-10 MED ORDER — GLYCOPYRROLATE 0.2 MG/ML IV SOSY
PREFILLED_SYRINGE | INTRAVENOUS | Status: AC
  Filled 2018-05-10: qty 5

## 2018-05-10 MED ORDER — DEXAMETHASONE SODIUM PHOSPHATE 10 MG/ML IJ SOLN
INTRAMUSCULAR | Status: AC
  Filled 2018-05-10: qty 1

## 2018-05-10 MED ORDER — FENTANYL CITRATE (PF) 100 MCG/2ML IJ SOLN
INTRAMUSCULAR | Status: AC
  Filled 2018-05-10: qty 2

## 2018-05-10 MED ORDER — SODIUM CHLORIDE 0.9 % IV SOLN
2.0000 g | Freq: Two times a day (BID) | INTRAVENOUS | Status: AC
  Administered 2018-05-10: 2 g via INTRAVENOUS
  Filled 2018-05-10: qty 2

## 2018-05-10 MED ORDER — SUGAMMADEX SODIUM 200 MG/2ML IV SOLN
INTRAVENOUS | Status: AC
  Filled 2018-05-10: qty 2

## 2018-05-10 MED ORDER — HYDROMORPHONE HCL 1 MG/ML IJ SOLN
0.5000 mg | INTRAMUSCULAR | Status: DC | PRN
  Administered 2018-05-10 – 2018-05-11 (×5): 1 mg via INTRAVENOUS
  Administered 2018-05-12 – 2018-05-13 (×4): 2 mg via INTRAVENOUS
  Filled 2018-05-10 (×2): qty 1
  Filled 2018-05-10 (×4): qty 2
  Filled 2018-05-10 (×3): qty 1

## 2018-05-10 MED ORDER — ZINC OXIDE 20 % EX OINT
1.0000 "application " | TOPICAL_OINTMENT | Freq: Three times a day (TID) | CUTANEOUS | Status: DC | PRN
  Filled 2018-05-10: qty 28.35

## 2018-05-10 MED ORDER — BUPIVACAINE-EPINEPHRINE (PF) 0.25% -1:200000 IJ SOLN
INTRAMUSCULAR | Status: AC
  Filled 2018-05-10: qty 30

## 2018-05-10 MED ORDER — ALUM & MAG HYDROXIDE-SIMETH 200-200-20 MG/5ML PO SUSP
30.0000 mL | Freq: Four times a day (QID) | ORAL | Status: DC | PRN
  Administered 2018-05-13: 30 mL via ORAL
  Filled 2018-05-10: qty 30

## 2018-05-10 MED ORDER — 0.9 % SODIUM CHLORIDE (POUR BTL) OPTIME
TOPICAL | Status: DC | PRN
  Administered 2018-05-10: 2000 mL

## 2018-05-10 MED ORDER — LACTATED RINGERS IR SOLN
Status: DC | PRN
  Administered 2018-05-10: 1000 mL

## 2018-05-10 MED ORDER — MEPERIDINE HCL 50 MG/ML IJ SOLN: 6.2500 mg | INTRAMUSCULAR | Status: DC | PRN

## 2018-05-10 MED ORDER — KETAMINE HCL 10 MG/ML IJ SOLN
INTRAMUSCULAR | Status: DC | PRN
  Administered 2018-05-10 (×2): 10 mg via INTRAVENOUS
  Administered 2018-05-10: 30 mg via INTRAVENOUS

## 2018-05-10 MED ORDER — METOPROLOL TARTRATE 5 MG/5ML IV SOLN: 5.0000 mg | Freq: Four times a day (QID) | INTRAVENOUS | Status: DC | PRN

## 2018-05-10 MED ORDER — METOCLOPRAMIDE HCL 5 MG/ML IJ SOLN: 10.0000 mg | Freq: Once | INTRAMUSCULAR | Status: DC | PRN

## 2018-05-10 MED ORDER — ONDANSETRON HCL 4 MG/2ML IJ SOLN
INTRAMUSCULAR | Status: DC | PRN
  Administered 2018-05-10: 4 mg via INTRAVENOUS

## 2018-05-10 MED ORDER — PHENYLEPHRINE 40 MCG/ML (10ML) SYRINGE FOR IV PUSH (FOR BLOOD PRESSURE SUPPORT)
PREFILLED_SYRINGE | INTRAVENOUS | Status: AC
  Filled 2018-05-10: qty 10

## 2018-05-10 MED ORDER — CEFOTETAN DISODIUM-DEXTROSE 2-2.08 GM-%(50ML) IV SOLR
2.0000 g | INTRAVENOUS | Status: DC
  Administered 2018-05-10: 2 g via INTRAVENOUS
  Filled 2018-05-10: qty 50

## 2018-05-10 MED ORDER — PROCHLORPERAZINE EDISYLATE 10 MG/2ML IJ SOLN: 5.0000 mg | Freq: Four times a day (QID) | INTRAMUSCULAR | Status: DC | PRN

## 2018-05-10 MED ORDER — SUCCINYLCHOLINE CHLORIDE 200 MG/10ML IV SOSY
PREFILLED_SYRINGE | INTRAVENOUS | Status: DC | PRN
  Administered 2018-05-10: 100 mg via INTRAVENOUS

## 2018-05-10 MED ORDER — PROPOFOL 10 MG/ML IV BOLUS
INTRAVENOUS | Status: AC
  Filled 2018-05-10: qty 20

## 2018-05-10 MED ORDER — BUPIVACAINE-EPINEPHRINE (PF) 0.5% -1:200000 IJ SOLN
INTRAMUSCULAR | Status: AC
  Filled 2018-05-10: qty 30

## 2018-05-10 MED ORDER — GABAPENTIN 300 MG PO CAPS
300.0000 mg | ORAL_CAPSULE | Freq: Two times a day (BID) | ORAL | Status: DC
  Administered 2018-05-10 – 2018-05-14 (×8): 300 mg via ORAL
  Filled 2018-05-10 (×9): qty 1

## 2018-05-10 MED ORDER — ONDANSETRON HCL 4 MG/2ML IJ SOLN
INTRAMUSCULAR | Status: AC
  Filled 2018-05-10: qty 2

## 2018-05-10 MED ORDER — HYDRALAZINE HCL 20 MG/ML IJ SOLN: 10.0000 mg | INTRAMUSCULAR | Status: DC | PRN

## 2018-05-10 MED ORDER — DEXAMETHASONE SODIUM PHOSPHATE 10 MG/ML IJ SOLN
INTRAMUSCULAR | Status: DC | PRN
  Administered 2018-05-10: 10 mg via INTRAVENOUS

## 2018-05-10 MED ORDER — GLYCOPYRROLATE 0.2 MG/ML IV SOSY
PREFILLED_SYRINGE | INTRAVENOUS | Status: DC | PRN
  Administered 2018-05-10: 0.4 mg via INTRAVENOUS

## 2018-05-10 MED ORDER — ONDANSETRON HCL 4 MG PO TABS: 4.0000 mg | ORAL_TABLET | Freq: Four times a day (QID) | ORAL | Status: DC | PRN

## 2018-05-10 MED ORDER — ENALAPRILAT 1.25 MG/ML IV SOLN
0.6250 mg | Freq: Four times a day (QID) | INTRAVENOUS | Status: DC | PRN
  Administered 2018-05-10: 1.25 mg via INTRAVENOUS
  Filled 2018-05-10: qty 1

## 2018-05-10 MED ORDER — ENSURE SURGERY PO LIQD
237.0000 mL | Freq: Two times a day (BID) | ORAL | Status: DC
  Administered 2018-05-11 – 2018-05-12 (×4): 237 mL via ORAL
  Filled 2018-05-10 (×8): qty 237

## 2018-05-10 MED ORDER — MAGIC MOUTHWASH
15.0000 mL | Freq: Four times a day (QID) | ORAL | Status: DC | PRN
  Filled 2018-05-10: qty 15

## 2018-05-10 MED ORDER — ALPRAZOLAM 0.5 MG PO TABS
0.5000 mg | ORAL_TABLET | Freq: Two times a day (BID) | ORAL | Status: DC | PRN
Start: 2018-05-10 — End: 2018-05-14
  Administered 2018-05-10: 0.5 mg via ORAL
  Filled 2018-05-10: qty 1

## 2018-05-10 MED ORDER — METHOCARBAMOL 500 MG PO TABS
1000.0000 mg | ORAL_TABLET | Freq: Four times a day (QID) | ORAL | Status: DC | PRN
  Administered 2018-05-10 – 2018-05-14 (×3): 1000 mg via ORAL
  Filled 2018-05-10 (×3): qty 2

## 2018-05-10 MED ORDER — ROCURONIUM BROMIDE 10 MG/ML (PF) SYRINGE
PREFILLED_SYRINGE | INTRAVENOUS | Status: DC | PRN
  Administered 2018-05-10: 20 mg via INTRAVENOUS
  Administered 2018-05-10: 10 mg via INTRAVENOUS
  Administered 2018-05-10: 50 mg via INTRAVENOUS

## 2018-05-10 MED ORDER — ENOXAPARIN SODIUM 40 MG/0.4ML ~~LOC~~ SOLN
40.0000 mg | Freq: Once | SUBCUTANEOUS | Status: AC
  Administered 2018-05-10: 40 mg via SUBCUTANEOUS
  Filled 2018-05-10: qty 0.4

## 2018-05-10 MED ORDER — SODIUM CHLORIDE 0.9 % IV SOLN
2.0000 g | INTRAVENOUS | Status: DC
  Filled 2018-05-10: qty 2

## 2018-05-10 MED ORDER — DIPHENHYDRAMINE HCL 50 MG/ML IJ SOLN: 12.5000 mg | Freq: Four times a day (QID) | INTRAMUSCULAR | Status: DC | PRN

## 2018-05-10 MED ORDER — GENTAMICIN SULFATE 40 MG/ML IJ SOLN
INTRAMUSCULAR | Status: DC
  Filled 2018-05-10: qty 6

## 2018-05-10 MED ORDER — BUPIVACAINE-EPINEPHRINE 0.25% -1:200000 IJ SOLN
INTRAMUSCULAR | Status: DC | PRN
  Administered 2018-05-10: 60 mL

## 2018-05-10 MED ORDER — HYDROCORTISONE 2.5 % RE CREA: 1.0000 "application " | TOPICAL_CREAM | Freq: Four times a day (QID) | RECTAL | Status: DC | PRN

## 2018-05-10 MED ORDER — LACTATED RINGERS IV SOLN: INTRAVENOUS | Status: DC

## 2018-05-10 MED ORDER — ACETAMINOPHEN 500 MG PO TABS
1000.0000 mg | ORAL_TABLET | Freq: Four times a day (QID) | ORAL | Status: DC
  Administered 2018-05-10 – 2018-05-14 (×16): 1000 mg via ORAL
  Filled 2018-05-10 (×15): qty 2

## 2018-05-10 MED ORDER — FENTANYL CITRATE (PF) 100 MCG/2ML IJ SOLN
INTRAMUSCULAR | Status: AC
  Administered 2018-05-10: 50 ug via INTRAVENOUS
  Filled 2018-05-10: qty 2

## 2018-05-10 MED ORDER — SACCHAROMYCES BOULARDII 250 MG PO CAPS
250.0000 mg | ORAL_CAPSULE | Freq: Two times a day (BID) | ORAL | Status: DC
  Administered 2018-05-10: 250 mg via ORAL
  Filled 2018-05-10: qty 1

## 2018-05-10 MED ORDER — PROCHLORPERAZINE MALEATE 10 MG PO TABS: 10.0000 mg | ORAL_TABLET | Freq: Four times a day (QID) | ORAL | Status: DC | PRN

## 2018-05-10 MED ORDER — CHLORHEXIDINE GLUCONATE CLOTH 2 % EX PADS: 6.0000 | MEDICATED_PAD | Freq: Once | CUTANEOUS | Status: DC

## 2018-05-10 MED ORDER — DIPHENHYDRAMINE HCL 12.5 MG/5ML PO ELIX: 12.5000 mg | ORAL_SOLUTION | Freq: Four times a day (QID) | ORAL | Status: DC | PRN

## 2018-05-10 MED ORDER — ONDANSETRON HCL 4 MG/2ML IJ SOLN: 4.0000 mg | Freq: Four times a day (QID) | INTRAMUSCULAR | Status: DC | PRN

## 2018-05-10 MED ORDER — TRAMADOL HCL 50 MG PO TABS
50.0000 mg | ORAL_TABLET | Freq: Four times a day (QID) | ORAL | Status: DC | PRN
  Administered 2018-05-12 – 2018-05-13 (×2): 100 mg via ORAL
  Administered 2018-05-13: 50 mg via ORAL
  Administered 2018-05-14: 100 mg via ORAL
  Filled 2018-05-10 (×2): qty 2
  Filled 2018-05-10 (×3): qty 1

## 2018-05-10 MED ORDER — FENTANYL CITRATE (PF) 100 MCG/2ML IJ SOLN
INTRAMUSCULAR | Status: DC | PRN
  Administered 2018-05-10 (×4): 50 ug via INTRAVENOUS

## 2018-05-10 MED ORDER — ALVIMOPAN 12 MG PO CAPS
12.0000 mg | ORAL_CAPSULE | ORAL | Status: AC
  Administered 2018-05-10: 12 mg via ORAL
  Filled 2018-05-10: qty 1

## 2018-05-10 MED ORDER — SODIUM CHLORIDE 0.9 % IV SOLN
INTRAVENOUS | Status: DC | PRN
  Administered 2018-05-10: 1000 mL via INTRAPERITONEAL

## 2018-05-10 MED ORDER — METOCLOPRAMIDE HCL 5 MG/ML IJ SOLN: 10.0000 mg | Freq: Four times a day (QID) | INTRAMUSCULAR | Status: DC | PRN

## 2018-05-10 MED ORDER — LIDOCAINE 2% (20 MG/ML) 5 ML SYRINGE
INTRAMUSCULAR | Status: DC | PRN
  Administered 2018-05-10: 100 mg via INTRAVENOUS

## 2018-05-10 MED ORDER — PHENOL 1.4 % MT LIQD: 1.0000 | OROMUCOSAL | Status: DC | PRN

## 2018-05-10 MED ORDER — LIDOCAINE 20MG/ML (2%) 15 ML SYRINGE OPTIME
INTRAMUSCULAR | Status: DC | PRN
  Administered 2018-05-10: 1 mg/kg/h via INTRAVENOUS

## 2018-05-10 MED ORDER — KETAMINE HCL 10 MG/ML IJ SOLN
INTRAMUSCULAR | Status: AC
  Filled 2018-05-10: qty 1

## 2018-05-10 MED ORDER — MIDAZOLAM HCL 5 MG/5ML IJ SOLN
INTRAMUSCULAR | Status: DC | PRN
  Administered 2018-05-10: 2 mg via INTRAVENOUS

## 2018-05-10 MED ORDER — FENTANYL CITRATE (PF) 100 MCG/2ML IJ SOLN
25.0000 ug | INTRAMUSCULAR | Status: DC | PRN
  Administered 2018-05-10 (×3): 50 ug via INTRAVENOUS

## 2018-05-10 MED ORDER — GUAIFENESIN-DM 100-10 MG/5ML PO SYRP: 10.0000 mL | ORAL_SOLUTION | ORAL | Status: DC | PRN

## 2018-05-10 MED ORDER — PHENYLEPHRINE 40 MCG/ML (10ML) SYRINGE FOR IV PUSH (FOR BLOOD PRESSURE SUPPORT)
PREFILLED_SYRINGE | INTRAVENOUS | Status: DC | PRN
  Administered 2018-05-10 (×4): 80 ug via INTRAVENOUS

## 2018-05-10 MED ORDER — PROPOFOL 10 MG/ML IV BOLUS
INTRAVENOUS | Status: DC | PRN
  Administered 2018-05-10: 150 mg via INTRAVENOUS

## 2018-05-10 MED ORDER — MENTHOL 3 MG MT LOZG: 1.0000 | LOZENGE | OROMUCOSAL | Status: DC | PRN

## 2018-05-10 MED ORDER — LIP MEDEX EX OINT
1.0000 "application " | TOPICAL_OINTMENT | Freq: Two times a day (BID) | CUTANEOUS | Status: DC
  Administered 2018-05-10 – 2018-05-13 (×7): 1 via TOPICAL
  Filled 2018-05-10: qty 7

## 2018-05-10 SURGICAL SUPPLY — 110 items
APPLIER CLIP 5 13 M/L LIGAMAX5 (MISCELLANEOUS)
APPLIER CLIP ROT 10 11.4 M/L (STAPLE)
APR CLP MED LRG 11.4X10 (STAPLE)
APR CLP MED LRG 5 ANG JAW (MISCELLANEOUS)
BLADE EXTENDED COATED 6.5IN (ELECTRODE) IMPLANT
CANNULA REDUC XI 12-8 STAPL (CANNULA) ×1
CANNULA REDUC XI 12-8MM STAPL (CANNULA) ×1
CANNULA REDUCER 12-8 DVNC XI (CANNULA) ×1 IMPLANT
CELLS DAT CNTRL 66122 CELL SVR (MISCELLANEOUS) IMPLANT
CHLORAPREP W/TINT 26ML (MISCELLANEOUS) ×5 IMPLANT
CLIP APPLIE 5 13 M/L LIGAMAX5 (MISCELLANEOUS) IMPLANT
CLIP APPLIE ROT 10 11.4 M/L (STAPLE) IMPLANT
CLIP VESOLOCK LG 6/CT PURPLE (CLIP) IMPLANT
CLIP VESOLOCK MED LG 6/CT (CLIP) IMPLANT
COVER SURGICAL LIGHT HANDLE (MISCELLANEOUS) ×5 IMPLANT
COVER TIP SHEARS 8 DVNC (MISCELLANEOUS) ×1 IMPLANT
COVER TIP SHEARS 8MM DA VINCI (MISCELLANEOUS) ×2
DECANTER SPIKE VIAL GLASS SM (MISCELLANEOUS) ×5 IMPLANT
DEVICE TROCAR PUNCTURE CLOSURE (ENDOMECHANICALS) IMPLANT
DRAIN CHANNEL 19F RND (DRAIN) IMPLANT
DRAPE ARM DVNC X/XI (DISPOSABLE) ×4 IMPLANT
DRAPE COLUMN DVNC XI (DISPOSABLE) ×1 IMPLANT
DRAPE DA VINCI XI ARM (DISPOSABLE) ×8
DRAPE DA VINCI XI COLUMN (DISPOSABLE) ×2
DRAPE SURG IRRIG POUCH 19X23 (DRAPES) ×3 IMPLANT
DRSG OPSITE POSTOP 4X10 (GAUZE/BANDAGES/DRESSINGS) IMPLANT
DRSG OPSITE POSTOP 4X6 (GAUZE/BANDAGES/DRESSINGS) IMPLANT
DRSG OPSITE POSTOP 4X8 (GAUZE/BANDAGES/DRESSINGS) IMPLANT
DRSG TEGADERM 2-3/8X2-3/4 SM (GAUZE/BANDAGES/DRESSINGS) ×12 IMPLANT
DRSG TEGADERM 4X4.75 (GAUZE/BANDAGES/DRESSINGS) IMPLANT
ELECT PENCIL ROCKER SW 15FT (MISCELLANEOUS) ×4 IMPLANT
ELECT REM PT RETURN 15FT ADLT (MISCELLANEOUS) ×3 IMPLANT
ENDOLOOP SUT PDS II  0 18 (SUTURE)
ENDOLOOP SUT PDS II 0 18 (SUTURE) IMPLANT
EVACUATOR SILICONE 100CC (DRAIN) IMPLANT
GAUZE SPONGE 2X2 8PLY STRL LF (GAUZE/BANDAGES/DRESSINGS) ×1 IMPLANT
GAUZE SPONGE 4X4 12PLY STRL (GAUZE/BANDAGES/DRESSINGS) IMPLANT
GLOVE ECLIPSE 8.0 STRL XLNG CF (GLOVE) ×9 IMPLANT
GLOVE INDICATOR 8.0 STRL GRN (GLOVE) ×9 IMPLANT
GOWN STRL REUS W/TWL XL LVL3 (GOWN DISPOSABLE) ×17 IMPLANT
GRASPER ENDOPATH ANVIL 10MM (MISCELLANEOUS) IMPLANT
HOLDER FOLEY CATH W/STRAP (MISCELLANEOUS) ×3 IMPLANT
IRRIG SUCT STRYKERFLOW 2 WTIP (MISCELLANEOUS) ×3
IRRIGATION SUCT STRKRFLW 2 WTP (MISCELLANEOUS) ×1 IMPLANT
KIT PROCEDURE DA VINCI SI (MISCELLANEOUS)
KIT PROCEDURE DVNC SI (MISCELLANEOUS) ×1 IMPLANT
LEGGING LITHOTOMY PAIR STRL (DRAPES) ×3 IMPLANT
LUBRICANT JELLY K Y 4OZ (MISCELLANEOUS) IMPLANT
NDL INSUFFLATION 14GA 120MM (NEEDLE) ×1 IMPLANT
NEEDLE INSUFFLATION 14GA 120MM (NEEDLE) ×3 IMPLANT
PACK CARDIOVASCULAR III (CUSTOM PROCEDURE TRAY) ×3 IMPLANT
PACK COLON (CUSTOM PROCEDURE TRAY) ×3 IMPLANT
PAD POSITIONING PINK XL (MISCELLANEOUS) ×3 IMPLANT
PORT LAP GEL ALEXIS MED 5-9CM (MISCELLANEOUS) ×3 IMPLANT
RELOAD STAPLE 45 BLU REG DVNC (STAPLE) IMPLANT
RELOAD STAPLE 45 GRN THCK DVNC (STAPLE) IMPLANT
RETRACTOR LONE STAR DISPOSABLE (INSTRUMENTS) ×2 IMPLANT
RETRACTOR STAY HOOK 5MM (MISCELLANEOUS) ×2 IMPLANT
RETRACTOR WND ALEXIS 18 MED (MISCELLANEOUS) IMPLANT
RTRCTR WOUND ALEXIS 18CM MED (MISCELLANEOUS)
SCISSORS LAP 5X35 DISP (ENDOMECHANICALS) ×3 IMPLANT
SEAL CANN UNIV 5-8 DVNC XI (MISCELLANEOUS) ×3 IMPLANT
SEAL XI 5MM-8MM UNIVERSAL (MISCELLANEOUS) ×6
SEALER VESSEL DA VINCI XI (MISCELLANEOUS) ×2
SEALER VESSEL EXT DVNC XI (MISCELLANEOUS) ×1 IMPLANT
SLEEVE ADV FIXATION 5X100MM (TROCAR) ×3 IMPLANT
SOLUTION ELECTROLUBE (MISCELLANEOUS) ×3 IMPLANT
SPONGE GAUZE 2X2 STER 10/PKG (GAUZE/BANDAGES/DRESSINGS) ×2
STAPLER 45 BLU RELOAD XI (STAPLE) IMPLANT
STAPLER 45 BLUE RELOAD XI (STAPLE)
STAPLER 45 GREEN RELOAD XI (STAPLE)
STAPLER 45 GRN RELOAD XI (STAPLE) IMPLANT
STAPLER CANNULA SEAL DVNC XI (STAPLE) ×1 IMPLANT
STAPLER CANNULA SEAL XI (STAPLE) ×2
STAPLER PROXIMATE 75MM BLUE (STAPLE) ×2 IMPLANT
STAPLER SHEATH (SHEATH) ×2
STAPLER SHEATH ENDOWRIST DVNC (SHEATH) ×1 IMPLANT
SUT ETHILON 3 0 PS 1 (SUTURE) ×2 IMPLANT
SUT MNCRL AB 4-0 PS2 18 (SUTURE) ×3 IMPLANT
SUT PDS AB 1 CTX 36 (SUTURE) ×6 IMPLANT
SUT PDS AB 1 TP1 96 (SUTURE) IMPLANT
SUT PDS AB 2-0 CT2 27 (SUTURE) IMPLANT
SUT PROLENE 0 CT 2 (SUTURE) ×3 IMPLANT
SUT PROLENE 2 0 KS (SUTURE) ×3 IMPLANT
SUT PROLENE 2 0 SH DA (SUTURE) ×2 IMPLANT
SUT SILK 2 0 (SUTURE) ×3
SUT SILK 2 0 SH CR/8 (SUTURE) ×3 IMPLANT
SUT SILK 2-0 18XBRD TIE 12 (SUTURE) ×1 IMPLANT
SUT SILK 3 0 (SUTURE) ×3
SUT SILK 3 0 SH CR/8 (SUTURE) ×3 IMPLANT
SUT SILK 3-0 18XBRD TIE 12 (SUTURE) ×1 IMPLANT
SUT V-LOC BARB 180 2/0GR6 GS22 (SUTURE)
SUT VIC AB 2-0 SH 18 (SUTURE) ×2 IMPLANT
SUT VIC AB 2-0 UR6 27 (SUTURE) ×2 IMPLANT
SUT VIC AB 3-0 SH 18 (SUTURE) IMPLANT
SUT VIC AB 3-0 SH 27 (SUTURE)
SUT VIC AB 3-0 SH 27XBRD (SUTURE) IMPLANT
SUT VICRYL 0 UR6 27IN ABS (SUTURE) ×3 IMPLANT
SUTURE V-LC BRB 180 2/0GR6GS22 (SUTURE) IMPLANT
SYR 10ML LL (SYRINGE) ×3 IMPLANT
SYS LAPSCP GELPORT 120MM (MISCELLANEOUS)
SYSTEM LAPSCP GELPORT 120MM (MISCELLANEOUS) IMPLANT
TAPE UMBILICAL COTTON 1/8X30 (MISCELLANEOUS) ×3 IMPLANT
TOWEL OR 17X26 10 PK STRL BLUE (TOWEL DISPOSABLE) IMPLANT
TOWEL OR NON WOVEN STRL DISP B (DISPOSABLE) ×3 IMPLANT
TRAY FOLEY MTR SLVR 16FR STAT (SET/KITS/TRAYS/PACK) ×2 IMPLANT
TROCAR ADV FIXATION 5X100MM (TROCAR) IMPLANT
TUBING CONNECTING 10 (TUBING) ×2 IMPLANT
TUBING CONNECTING 10' (TUBING) ×2
TUBING INSUFFLATION 10FT LAP (TUBING) ×3 IMPLANT

## 2018-05-10 NOTE — Consult Note (Signed)
[place] Nurse ostomy consult note Patient was marked for RUQ ileostomy at pre op visit last week.  He is nervous and not feeling well this morning.  Significant other at bedside.  Emotional support provided.  His preferred marking is still intact and covered with transparent film.  No further needs at this time.  [place] team will follow after surgery.  Domenic Moras RN BSN [place] Pager [phone]

## 2018-05-10 NOTE — Anesthesia Procedure Notes (Signed)
Procedure Name: Intubation Date/Time: 05/10/2018 12:31 PM Performed by: Lind Covert, CRNA Pre-anesthesia Checklist: Patient identified, Emergency Drugs available, Suction available, Patient being monitored and Timeout performed Patient Re-evaluated:Patient Re-evaluated prior to induction Oxygen Delivery Method: Circle system utilized Induction Type: IV induction Laryngoscope Size: Mac and 4 Grade View: Grade I Tube type: Oral Tube size: 7.5 mm Number of attempts: 1 Airway Equipment and Method: Stylet Placement Confirmation: ETT inserted through vocal cords under direct vision,  positive ETCO2 and breath sounds checked- equal and bilateral Secured at: 21 cm Tube secured with: Tape Dental Injury: Teeth and Oropharynx as per pre-operative assessment

## 2018-05-10 NOTE — Anesthesia Preprocedure Evaluation (Signed)
Anesthesia Evaluation  Patient identified by MRN, date of birth, ID band Patient awake    Reviewed: Allergy & Precautions, NPO status , Patient's Chart, lab work & pertinent test results  History of Anesthesia Complications Negative for: history of anesthetic complications  Airway Mallampati: II  TM Distance: >3 FB Neck ROM: Full    Dental  (+) Dental Advisory Given, Edentulous Upper, Poor Dentition,    Pulmonary Current Smoker,    breath sounds clear to auscultation       Cardiovascular negative cardio ROS   Rhythm:Regular Rate:Normal     Neuro/Psych Anxiety negative neurological ROS     GI/Hepatic Neg liver ROS, Rectal cancer   Endo/Other  negative endocrine ROS  Renal/GU negative Renal ROS     Musculoskeletal   Abdominal   Peds  Hematology negative hematology ROS (+)   Anesthesia Other Findings   Reproductive/Obstetrics                             Anesthesia Physical  Anesthesia Plan  ASA: II  Anesthesia Plan: General   Post-op Pain Management:    Induction:   PONV Risk Score and Plan: 3 and Treatment may vary due to age or medical condition, Ondansetron, Dexamethasone and Midazolam  Airway Management Planned: Oral ETT  Additional Equipment: None  Intra-op Plan:   Post-operative Plan: Extubation in OR  Informed Consent: I have reviewed the patients History and Physical, chart, labs and discussed the procedure including the risks, benefits and alternatives for the proposed anesthesia with the patient or authorized representative who has indicated his/her understanding and acceptance.   Dental advisory given  Plan Discussed with: CRNA  Anesthesia Plan Comments:         Anesthesia Quick Evaluation

## 2018-05-10 NOTE — Discharge Instructions (Signed)
Ostomy Support Information ° °You’ve heard that people get along just fine with only one of their eyes, or one of their lungs, or one of their kidneys. But you also know that you have only one intestine and only one bladder, and that leaves you feeling awfully empty, both physically and emotionally: You think no other people go around without part of their intestine with the ends of their intestines sticking out through their abdominal walls.  ° °YOU ARE NOT ALONE.  There are nearly three quarters of a million people in the [place] who have an ostomy; people who have had surgery to remove all or part of their colons or bladders.  ° °There is even a national association, the [employer] with over 350 local affiliated support groups that are organized by volunteers who provide peer support and counseling. [employer] has a toll free telephone num-ber, 800-826-0826 and an educational, interactive website, www.ostomy.org  ° °An ostomy is an opening in the belly (abdominal wall) made by surgery. Ostomates are people who have had this procedure. The opening (stoma) allows the kidney or bowel to discharge waste. An external pouch covers the stoma to collect waste. Pouches are are a simple bag and are odor free. Different companies have disposable or reusable pouches to fit one's lifestyle. An ostomy can either be temporary or permanent.  ° °THERE ARE THREE MAIN TYPES OF OSTOMIES °· Colostomy. A colostomy is a surgically created opening in the large intestine (colon). °· Ileostomy. An ileostomy is a surgically created opening in the small intestine. °· Urostomy. A urostomy is a surgically created opening to divert urine away from the bladder. ° °OSTOMY Care ° °The following guidelines will make care of your colostomy easier. Keep this information close by for quick reference. ° °Helpful DIET hints °Eat a well-balanced diet including vegetables and fresh fruits. Eat on a regular schedule. Drink at least 6 to 8  glasses of fluids daily. °Eat slowly in a relaxed atmosphere. Chew your food thoroughly. Avoid chewing gum, smoking, and drinking from a straw. This will help decrease the amount of air you swallow, which may help reduce gas. °Eating yogurt or drinking buttermilk may help reduce gas. ° °To control gas at night, do not eat after 8 p.m. This will give your bowel time to quiet down before you go to bed. ° °If gas is a problem, you can purchase Beano. Sprinkle Beano on the first bite of food before eating to reduce gas. It has no flavor and should not change the taste of your food. You can buy Beano over the counter at your local drugstore. ° °Foods like fish, onions, garlic, broccoli, asparagus, and cabbage produce odor. Although your pouch is odor-proof, if you eat these foods you may notice a stronger odor when emptying your pouch. If this is a concern, you may want to limit these foods in your diet. ° °If you have an ileostomy, you will have chronic diarrhea & need to drink more liquids to avoid getting dehydrated.  Consider antidiarrheal medicine like imodium (loperamide) or Lomotil to help slow down bowel movements / diarrhea into your ileostomy bag. ° °GETTING TO GOOD BOWEL HEALTH WITH AN ILEOSTOMY °. °Irregular bowel habits such as constipation and diarrhea can lead to many problems over time.  The goal: 3-6 small BOWEL MOVEMENTS A DAY!  To have soft, regular bowel movements:  °• Drink plenty of fluids, consider 4-6 tall glasses of water a day.   ° °Controlling   diarrheA ° °o Switch to liquids and simpler foods for a few days to avoid stressing your intestines further. °o Avoid dairy products (especially milk & ice cream) for a short time.  The intestines often can lose the ability to digest lactose when stressed. °o Avoid foods that cause gassiness or bloating.  Typical foods include beans and other legumes, cabbage, broccoli, and dairy foods.  Every person has some sensitivity to other foods, so listen to our  body and avoid those foods that trigger problems for you. °o Adding fiber (Citrucel, Metamucil, psyllium, Miralax) gradually can help thicken stools by absorbing excess fluid and retrain the intestines to act more normally.  Slowly increase the dose over a few weeks.  Too much fiber too soon can backfire and cause cramping & bloating. °o Probiotics (such as active yogurt, Align, etc) may help repopulate the intestines and colon with normal bacteria and calm down a sensitive digestive tract.  Most studies show it to be of mild help, though, and such products can be costly. °o Medicines: °- Bismuth subsalicylate (ex. Kayopectate, Pepto Bismol) every 30 minutes for up to 6 doses can help control diarrhea.  Avoid if pregnant. °- Loperamide (Immodium) can slow down diarrhea.  Start with two tablets (4mg total) first and then try one tablet every 6 hours.  Avoid if you are having fevers or severe pain.  If you are not better or start feeling worse, stop all medicines and call your doctor for advice °o Call your doctor if you are getting worse or not better.  Sometimes further testing (cultures, endoscopy, X-ray studies, bloodwork, etc) may be needed to help diagnose and treat the cause of the diarrhea. ° °TROUBLESHOOTING IRREGULAR BOWELS °1) Avoid extremes of bowel movements (no bad constipation/diarrhea) °2) Miralax 17gm mixed in 8oz. water or juice-daily. May use twice a day as needed  °3) Gas-x,Phazyme, etc. as needed for gas & bloating.  °4) Soft,bland diet. No spicy,greasy,fried foods.  °5) Prilosec (omeprazole) over-the-counter as needed  °6) May hold gluten/wheat products from diet to see if symptoms improve.  °7)  May try probiotics (Align, Activa, etc) to help calm the bowels down °7) If symptoms become worse call back immediately. ° ° °Applying the pouching system °To apply your pouch, follow these steps: ° °Place all your equipment close at hand before removing your pouch. ° °Wash your hands. ° °Stand or sit in  front of a mirror. Use the position that works best for you. Remember that you must keep the skin around the stoma wrinkle-free for a good seal. ° °Gently remove the used pouch (1-piece system) or the pouch and old wafer (2-piece system). Empty the pouch into the toilet. Save the closure clip to use again. ° °Wash the stoma itself and the skin around the stoma. Your stoma may bleed a little when being washed. This is normal. Rinse and pat dry. You may use a wash cloth or soft paper towels (like Bounty), mild soap (like Dial, Safeguard, or Ivory), and water. Avoid soaps that contain perfumes or lotions. ° °For a new pouch (1-piece system) or a new wafer (2-piece system), measure your stoma using the stoma guide in each box of supplies. ° °Trace the shape of your stoma onto the back of the new pouch or the back of the new wafer. Cut out the opening. Remove the paper backing and set it aside. ° °Optional: Apply a skin barrier powder to surrounding skin if it is irritated (bare or weeping),   and dust off the excess. °Optional: Apply a skin-prep wipe (such as Skin Prep or All-Kare) to the skin around the stoma, and let it dry. Do not apply this solution if the skin is irritated (red, tender, or broken) or if you have shaved around the stoma. °Optional: Apply a skin barrier paste (such as Stomahesive, Coloplast, or Premium) around the opening cut in the back of the pouch or wafer. Allow it to dry for 30 to 60 seconds. ° °Hold the pouch (1-piece system) or wafer (2-piece system) with the sticky side toward your body. Make sure the skin around the stoma is wrinkle-free. Center the opening on the stoma, then press firmly to your abdomen (Fig. 4). Look in the mirror to check if you are placing the pouch, or wafer, in the right position. For a 2-piece system, snap the pouch onto the wafer. Make sure it snaps into place securely. ° °Place your hand over the stoma and the pouch or wafer for about 30 seconds. The heat from your  hand can help the pouch or wafer stick to your skin. ° °Add deodorant (such as Super Banish or Nullo) to your pouch. Other options include food extracts such as vanilla oil and peppermint extract. Add about 10 drops of the deodorant to the pouch. Then apply the closure clamp. Note: Do not use toxic ° chemicals or commercial cleaning agents in your pouch. These substances may harm the stoma. ° °Optional: For extra seal, apply tape to all 4 sides around the pouch or wafer, as if you were framing a picture. You may use any brand of medical adhesive tape. °Change your pouch every 5 to 7 days. Change it immediately if a leak occurs.  Wash your hands afterwards. ° °If you are wearing a 2-piece system, you may use 2 new pouches per week and alternate them. Rinse the pouch with mild soap and warm water and hang it to dry for the next day. Apply the fresh pouch. Alternate the 2 pouches like this for a week. After a week, change the wafer and begin with 2 new pouches. Place the old pouches in a plastic bag, and put them in the trash. ° ° ° °Tips for colostomy care ° °Applying Your Pouch °You may stand or sit to apply your pouch. ° °Keep the skin where you apply the pouch wrinkle-free. If the skin around the pouch is wrinkled, the seal may break when your skin stretches. ° °If hair grows close to your stoma, you may trim off the hair with scissors, an electric razor, or a safety razor. ° °Always have a mirror nearby so you can get a better view of your stoma. ° °When you apply a new pouch, write the date on the adhesive tape. This will remind you of when you last changed your pouch. ° °Changing Your Pouch °The best time to change your pouch is in the morning, before eating or drinking anything. Your stoma can function at any time, but it will function more after eating or drinking. ° °Emptying Your Pouch °Empty your pouch when it is one-third full (of urine, stool, and/or gas). If you wait until your pouch is fuller than this,  it will be more difficult to empty and more noticeable. °When you empty your pouch, either put toilet paper in the toilet bowl first, or flush the toilet while you empty the pouch. This will reduce splashing. You can empty the pouch between your legs or to one side while sitting,   or while standing or stooping. If you have a 2-piece system, you can snap off the pouch to empty it. Remember that your stoma may function during this time. °If you wish to rinse your pouch after you empty it, a turkey baster can be helpful. When using a baster, squirt water up into the pouch through the opening at the °bottom. With a 2-piece system, you can snap off the pouch to rinse it. After rinsing  your pouch, empty it into the toilet. °When rinsing your pouch at home, put a few granules of Dreft soap in the rinse water. This helps lubricate and freshen your pouch. °The inside of your pouch can be sprayed with non-stick cooking oil (Pam spray). This may help reduce stool sticking to the inside of the pouch. ° °Bathing °You may shower or bathe with your pouch on or off. Remember that your stoma may function during this time. ° °The materials you use to wash your stoma and the skin around it should be clean, but they do not need to be sterile. ° °Wearing Your Pouch °During hot weather, or if you perspire a lot in general, wear a cover over your pouch. This may prevent a rash on your skin under the pouch. Pouch covers are sold at ostomy supply stores. °Wear the pouch inside your underwear for better support. °Watch your weight. Any gain or loss of 10 to 15 pounds or more can change the way your pouch fits. ° °Going Away From Home °A collapsible cup (like those that come in travel kits) or a soft plastic squirt bottle with a pull-up top (like a travel bottle for shampoo) can be used for rinsing your pouch when you are away from home. Tilt the opening of the pouch at an upward angle when using a cup to rinse. ° °Carry wet wipes or extra  tissues to use in public bathrooms. ° °Carry an extra pouching system with you at all times. ° °Never keep ostomy supplies in the glove compartment of your car. Extreme heat or cold can damage the skin barriers and adhesive wafers on the pouch. ° °When you travel, carry your ostomy supplies with you at all times. Keep them within easy reach. Do not pack ostomy supplies in baggage that will be checked or otherwise separated from you, because your baggage might be lost. If you’re traveling out of the country, it is helpful to have a letter stating that you are carrying ostomy supplies as a medical necessity. ° °If you need ostomy supplies while traveling, look in the yellow pages of the telephone book under “Surgical Supplies.” Or call the local ostomy organization to find out where supplies are available. ° °Do not let your ostomy supplies get low. Always order new pouches before you use the last one. ° °Reducing Odor °Limit foods such as broccoli, cabbage, onions, fish, and garlic in your diet to help reduce odor. °Each time you empty your pouch, carefully clean the opening of the pouch, both inside and outside, with toilet paper. °Rinse your pouch 1 or 2 times daily after you empty it (see directions for emptying your pouch and going away from home). °Add deodorant (such as Super Banish or Nullo) to your pouch. °Use air deodorizers in your bathroom. °Do not add aspirin to your pouch. Even though aspirin can help prevent odor, it could cause ulcers on your stoma. ° °When to call the doctor °Call the doctor if you have any of the following symptoms: °Purple, black,   or white stoma Severe cramps lasting more than 6 hours Severe watery discharge from the stoma lasting more than 6 hours No output from the colostomy for 3 days Excessive bleeding from your stoma Swelling of your stoma to more than 1/2-inch larger than usual Pulling inward of your stoma below skin level Severe skin irritation or deep ulcers Bulging  or other changes in your abdomen  When to call your ostomy nurse Call your ostomy/enterostomal therapy (ET) nurse if any of the following occurs: Frequent leaking of your pouching system Change in size or appearance of your stoma, causing discomfort or problems with your pouch Skin rash or rawness Weight gain or loss that causes problems with your pouch      FREQUENTLY ASKED QUESTIONS   Why havent you met any of these folks who have an ostomy?  Well, maybe you have! You just did not recognize them because an ostomy doesn't show. It can be kept secret if you wish. Why, maybe some of your best friends, office associates or neighbors have an ostomy ... you never can tell.   People facing ostomy surgery have many quality-of-life questions like:  Will you bulge? Smell? Make noises? Will you feel waste leaving your body? Will you be a captive of the toilet? Will you starve? Be a social outcast? Get/stay married? Have babies? Easily bathe, go swimming, bend over?  OK, lets look at what you can expect:   Will you bulge?  Remember, without part of the intestine or bladder, and its contents, you should have a flatter tummy than before. You can expect to wear, with little exception, what you wore before surgery ... and this in-cludes tight clothing and bathing suits.   Will you smell?  Today, thanks to modern odor proof pouching systems, you can walk into an ostomy support group meeting and not smell anything that is foul or offensive. And, for those with an ileostomy or colostomy who are concerned about odor when emptying their pouch, there are in-pouch deodorants that can be used to eliminate any waste odors that may exist.   Will you make noises?  Everyone produces gas, especially if they are an air-swallower. But intestinal sounds that occur from time to time are no differ-ent than a gurgling tummy, and quite often your clothing will muffle any sounds.   Will you feel the waste discharges?   For those with a colostomy or ileostomy there might be a slight pressure when waste leaves your body, but understand that the intestines have no nerve endings, so there will be no unpleasant sensations. Those with a urostomy will probably be unaware of any kidney drainage.   Will you be a captive of the toilet?  Immediately post-op you will spend more time in the bathroom than you will after your body recovers from surgery. Every person is different, but on average those with an ileostomy or urostomy may empty their pouches 4 to 6 times a day; a little  less if you have a colostomy. The average wear time between pouch system changes is 3 to 5 days and the changing process should take less than 30 minutes.   Will I need to be on a special diet? Most people return to their normal diet when they have recovered from surgery. Be sure to chew your food well, eat a well-balanced diet and drink plenty of fluids. If you experience problems with a certain food, wait a couple of weeks and try it again.  Will there be  odor and noises? Pouching systems are designed to be odor-proof or odor-resistant. There are deodorants that can be used in the pouch. Medications are also available to help reduce odor. Limit gas-producing foods and carbonated beverages. You will experience less gas and fewer noises as you heal from surgery.  How much time will it take to care for my ostomy? At first, you may spend a lot of time learning about your ostomy and how to take care of it. As you become more comfortable and skilled at changing the pouching system, it will take very little time to care for it.   Will I be able to return to work? People with ostomies can perform most jobs. As soon as you have healed from surgery, you should be able to return to work. Heavy lifting (more than 10 pounds) may be discouraged.   What about intimacy? Sexual relationships and intimacy are important and fulfilling aspects of your life. They  should continue after ostomy surgery. Intimacy-related concerns should be discussed openly between you and your partner.   Can I wear regular clothing? You do not need to wear special clothing. Ostomy pouches are fairly flat and barely noticeable. Elastic undergarments will not hurt the stoma or prevent the ostomy from functioning.   Can I participate in sports? An ostomy should not limit your involvement in sports. Many people with ostomies are runners, skiers, swimmers or participate in other active lifestyles. Talk with your caregiver first before doing heavy physical activity.  Will you starve?  Not if you follow doctors orders at each stage of your post-op adjustment. There is no such thing as an ostomy diet. Some people with an ostomy will be able to eat and tolerate anything; others may find diffi-culty with some foods. Each person is an individual and must determine, by trial, what is best for them. A good practice for all is to drink plenty of water.   Will you be a social outcast?  Have you met anyone who has an ostomy and is a social outcast? Why should you be the first? Only your attitude and self image will effect how you are treated. No confi-dent person is an Occupational psychologist.     PROFESSIONAL HELP  Resources are available if you need help or have questions about your ostomy.    Specially trained nurses called Wound, Ostomy Continence Nurses (WOCN) are available for consultation in most major medical centers.   Consider getting an ostomy consult at one of the outpatient ostomy clinics.  [facility] one in [place] at this time   The [employer] ([employer]) is a group made up of many local chapters throughout the [place]. These local groups hold meetings and provide support to prospective and existing ostomates. They sponsor educational events and have qualified visitors to make personal or telephone visits. Contact the [employer] for the chapter nearest you and for other  educational publications.   More detailed information can be found in Colostomy Guide, a publication of the [employer] ([employer]OA). Contact [employer] at 1-704-169-6906 or visit their web site at [email or web address] The website contains links to other sites, suppliers and resources.   Tree surgeon Start Services:  Start at the website to enlist for support.  Your Wound Ostomy (WOCN) nurse may have started this process. [email or web address]/securestart  Secure Start services are designed to support people as they live their lives with an ostomy or neurogenic bladder. Enrolling is easy and at no cost to the patient. We realize that  each person's needs and life journey are different. Through Secure Start services, we want to help people live their life, their way.  SURGERY: POST OP INSTRUCTIONS (Surgery for small bowel obstruction, colon resection, etc)   ######################################################################  EAT Gradually transition to a high fiber diet with a fiber supplement over the next few days after discharge  WALK Walk an hour a day.  Control your pain to do that.    CONTROL PAIN Control pain so that you can walk, sleep, tolerate sneezing/coughing, go up/down stairs.  HAVE A BOWEL MOVEMENT DAILY Keep your bowels regular to avoid problems.  OK to try a laxative to override constipation.  OK to use an antidairrheal to slow down diarrhea.  Call if not better after 2 tries  CALL IF YOU HAVE PROBLEMS/CONCERNS Call if you are still struggling despite following these instructions. Call if you have concerns not answered by these instructions  ######################################################################   DIET Follow a light diet the first few days at home.  Start with a bland diet such as soups, liquids, starchy foods, low fat foods, etc.  If you feel full, bloated, or constipated, stay on a ful liquid or pureed/blenderized diet for a few days  until you feel better and no longer constipated. Be sure to drink plenty of fluids every day to avoid getting dehydrated (feeling dizzy, not urinating, etc.). Gradually add a fiber supplement to your diet over the next week.  Gradually get back to a regular solid diet.  Avoid fast food or heavy meals the first week as you are more likely to get nauseated. It is expected for your digestive tract to need a few months to get back to normal.  It is common for your bowel movements and stools to be irregular.  You will have occasional bloating and cramping that should eventually fade away.  Until you are eating solid food normally, off all pain medications, and back to regular activities; your bowels will not be normal. Focus on eating a low-fat, high fiber diet the rest of your life (See Getting to [place], below).  CARE of your INCISION or WOUND It is good for closed incision and even open wounds to be washed every day.  Shower every day.  Short baths are fine.  Wash the incisions and wounds clean with soap & water.    If you have a closed incision(s), wash the incision with soap & water every day.  You may leave closed incisions open to air if it is dry.   You may cover the incision with clean gauze & replace it after your daily shower for comfort. If you have skin tapes (Steristrips) or skin glue (Dermabond) on your incision, leave them in place.  They will fall off on their own like a scab.  You may trim any edges that curl up with clean scissors.  If you have staples, set up an appointment for them to be removed in the office in 10 days after surgery.  If you have a drain, wash around the skin exit site with soap & water and place a new dressing of gauze or band aid around the skin every day.  Keep the drain site clean & dry.    If you have an open wound with packing, see wound care instructions.  In general, it is encouraged that you remove your dressing and packing, shower with soap & water,  and replace your dressing once a day.  Pack the wound with clean gauze  moistened with normal (0.9%) saline to keep the wound moist & uninfected.  Pressure on the dressing for 30 minutes will stop most wound bleeding.  Eventually your body will heal & pull the open wound closed over the next few months.  Raw open wounds will occasionally bleed or secrete yellow drainage until it heals closed.  Drain sites will drain a little until the drain is removed.  Even closed incisions can have mild bleeding or drainage the first few days until the skin edges scab over & seal.   If you have an open wound with a wound vac, see wound vac care instructions.     ACTIVITIES as tolerated Start light daily activities --- self-care, walking, climbing stairs-- beginning the day after surgery.  Gradually increase activities as tolerated.  Control your pain to be active.  Stop when you are tired.  Ideally, walk several times a day, eventually an hour a day.   Most people are back to most day-to-day activities in a few weeks.  It takes 4-8 weeks to get back to unrestricted, intense activity. If you can walk 30 minutes without difficulty, it is safe to try more intense activity such as jogging, treadmill, bicycling, low-impact aerobics, swimming, etc. Save the most intensive and strenuous activity for last (Usually 4-8 weeks after surgery) such as sit-ups, heavy lifting, contact sports, etc.  Refrain from any intense heavy lifting or straining until you are off narcotics for pain control.  You will have off days, but things should improve week-by-week. DO NOT PUSH THROUGH PAIN.  Let pain be your guide: If it hurts to do something, don't do it.  Pain is your body warning you to avoid that activity for another week until the pain goes down. You may drive when you are no longer taking narcotic prescription pain medication, you can comfortably wear a seatbelt, and you can safely make sudden turns/stops to protect yourself without  hesitating due to pain. You may have sexual intercourse when it is comfortable. If it hurts to do something, stop.  MEDICATIONS Take your usually prescribed home medications unless otherwise directed.   Blood thinners:  Usually you can restart any strong blood thinners after the second postoperative day.  It is OK to take aspirin right away.     If you are on strong blood thinners (warfarin/Coumadin, Plavix, Xerelto, Eliquis, Pradaxa, etc), discuss with your surgeon, medicine PCP, and/or cardiologist for instructions on when to restart the blood thinner & if blood monitoring is needed (PT/INR blood check, etc).     PAIN CONTROL Pain after surgery or related to activity is often due to strain/injury to muscle, tendon, nerves and/or incisions.  This pain is usually short-term and will improve in a few months.  To help speed the process of healing and to get back to regular activity more quickly, DO THE FOLLOWING THINGS TOGETHER: 1. Increase activity gradually.  DO NOT PUSH THROUGH PAIN 2. Use Ice and/or Heat 3. Try Gentle Massage and/or Stretching 4. Take over the counter pain medication 5. Take Narcotic prescription pain medication for more severe pain  Good pain control = faster recovery.  It is better to take more medicine to be more active than to stay in bed all day to avoid medications. 1.  Increase activity gradually Avoid heavy lifting at first, then increase to lifting as tolerated over the next 6 weeks. Do not push through the pain.  Listen to your body and avoid positions and maneuvers than reproduce the pain.  Wait a few days before trying something more intense Walking an hour a day is encouraged to help your body recover faster and more safely.  Start slowly and stop when getting sore.  If you can walk 30 minutes without stopping or pain, you can try more intense activity (running, jogging, aerobics, cycling, swimming, treadmill, sex, sports, weightlifting, etc.) Remember: If it  hurts to do it, then dont do it! 2. Use Ice and/or Heat You will have swelling and bruising around the incisions.  This will take several weeks to resolve. Ice packs or heating pads (6-8 times a day, 30-60 minutes at a time) will help sooth soreness & bruising. Some people prefer to use ice alone, heat alone, or alternate between ice & heat.  Experiment and see what works best for you.  Consider trying ice for the first few days to help decrease swelling and bruising; then, switch to heat to help relax sore spots and speed recovery. Shower every day.  Short baths are fine.  It feels good!  Keep the incisions and wounds clean with soap & water.   3. Try Gentle Massage and/or Stretching Massage at the area of pain many times a day Stop if you feel pain - do not overdo it 4. Take over the counter pain medication This helps the muscle and nerve tissues become less irritable and calm down faster Choose ONE of the following over-the-counter anti-inflammatory medications: Acetaminophen 542m tabs (Tylenol) 1-2 pills with every meal and just before bedtime (avoid if you have liver problems or if you have acetaminophen in you narcotic prescription) Naproxen 2256mtabs (ex. Aleve, Naprosyn) 1-2 pills twice a day (avoid if you have kidney, stomach, IBD, or bleeding problems) Ibuprofen 2004mabs (ex. Advil, Motrin) 3-4 pills with every meal and just before bedtime (avoid if you have kidney, stomach, IBD, or bleeding problems) Take with food/snack several times a day as directed for at least 2 weeks to help keep pain / soreness down & more manageable. 5. Take Narcotic prescription pain medication for more severe pain A prescription for strong pain control is often given to you upon discharge (for example: oxycodone/Percocet, hydrocodone/Norco/Vicodin, or tramadol/Ultram) Take your pain medication as prescribed. Be mindful that most narcotic prescriptions contain Tylenol (acetaminophen) as well - avoid taking  too much Tylenol. If you are having problems/concerns with the prescription medicine (does not control pain, nausea, vomiting, rash, itching, etc.), please call us [phone] see if we need to switch you to a different pain medicine that will work better for you and/or control your side effects better. If you need a refill on your pain medication, you must call the office before 4 pm and on weekdays only.  By federal law, prescriptions for narcotics cannot be called into a pharmacy.  They must be filled out on paper & picked up from our office by the patient or authorized caretaker.  Prescriptions cannot be filled after 4 pm nor on weekends.    WHEN TO CALL US [phone] uncontrolled or worsening pain  Fever over 101 F (38.5 C) Concerns with the incision: Worsening pain, redness, rash/hives, swelling, bleeding, or drainage Reactions / problems with new medications (itching, rash, hives, nausea, etc.) Nausea and/or vomiting Difficulty urinating Difficulty breathing Worsening fatigue, dizziness, lightheadedness, blurred vision Other concerns If you are not getting better after two weeks or are noticing you are getting worse, contact our office [phone] for further advice.  We may need to adjust your medications,  your medications, re-evaluate you in the office, send you to the emergency room, or see what other things we can do to help. °The clinic staff is available to answer your questions during regular business hours (8:30am-5pm).  Please don’t hesitate to call and ask to speak to one of our nurses for clinical concerns.    °A surgeon from [facility] is always on call at the hospitals 24 hours/day °If you have a medical emergency, go to the nearest emergency room or call 911. ° °FOLLOW UP in our office °One the day of your discharge from the hospital (or the next business weekday), please call [facility] to set up or confirm an appointment to see your surgeon in the  office for a follow-up appointment.  Usually it is 2-3 weeks after your surgery.   °If you have skin staples at your incision(s), let the office know so we can set up a time in the office for the nurse to remove them (usually around 10 days after surgery). °Make sure that you call for appointments the day of discharge (or the next business weekday) from the hospital to ensure a convenient appointment time. °IF YOU HAVE DISABILITY OR FAMILY LEAVE FORMS, BRING THEM TO THE OFFICE FOR PROCESSING.  DO NOT GIVE THEM TO YOUR DOCTOR. ° °[facility] °[address], [address], [place], [place]  [address] ? °[phone] - Main °[phone] - Toll Free,  [phone] - Fax °[email or web address] ° °GETTING TO GOOD BOWEL HEALTH. °It is expected for your digestive tract to need a few months to get back to normal.  It is common for your bowel movements and stools to be irregular.  You will have occasional bloating and cramping that should eventually fade away.  Until you are eating solid food normally, off all pain medications, and back to regular activities; your bowels will not be normal.   °Avoiding constipation °The goal: ONE SOFT BOWEL MOVEMENT A DAY!    °Drink plenty of fluids.  Choose water first. °TAKE A FIBER SUPPLEMENT EVERY DAY THE REST OF YOUR LIFE °During your first week back home, gradually add back a fiber supplement every day °Experiment which form you can tolerate.   There are many forms such as powders, tablets, wafers, gummies, etc °Psyllium bran (Metamucil), methylcellulose (Citrucel), Miralax or Glycolax, Benefiber, Flax Seed.  °Adjust the dose week-by-week (1/2 dose/day to 6 doses a day) until you are moving your bowels 1-2 times a day.  Cut back the dose or try a different fiber product if it is giving you problems such as diarrhea or bloating. °Sometimes a laxative is needed to help jump-start bowels if constipated until the fiber supplement can help regulate your  bowels.  If you are tolerating eating & you are farting, it is okay to try a gentle laxative such as double dose MiraLax, prune juice, or Milk of Magnesia.  Avoid using laxatives too often. °Stool softeners can sometimes help counteract the constipating effects of narcotic pain medicines.  It can also cause diarrhea, so avoid using for too long. °If you are still constipated despite taking fiber daily, eating solids, and a few doses of laxatives, call our office. °Controlling diarrhea °Try drinking liquids and eating bland foods for a few days to avoid stressing your intestines further. °Avoid dairy products (especially milk & ice cream) for a short time.  The intestines often can lose the ability to digest lactose when stressed. °Avoid foods that cause gassiness or   bloating.  Typical foods include beans and other legumes, cabbage, broccoli, and dairy foods.  Avoid greasy, spicy, fast foods.  Every person has some sensitivity to other foods, so listen to your body and avoid those foods that trigger problems for you. °Probiotics (such as active yogurt, Align, etc) may help repopulate the intestines and colon with normal bacteria and calm down a sensitive digestive tract °Adding a fiber supplement gradually can help thicken stools by absorbing excess fluid and retrain the intestines to act more normally.  Slowly increase the dose over a few weeks.  Too much fiber too soon can backfire and cause cramping & bloating. °It is okay to try and slow down diarrhea with a few doses of antidiarrheal medicines.   °Bismuth subsalicylate (ex. Kayopectate, Pepto Bismol) for a few doses can help control diarrhea.  Avoid if pregnant.   °Loperamide (Imodium) can slow down diarrhea.  Start with one tablet (2mg) first.  Avoid if you are having fevers or severe pain.  °ILEOSTOMY PATIENTS WILL HAVE CHRONIC DIARRHEA since their colon is not in use.    °Drink plenty of liquids.  You will need to drink even more glasses of water/liquid a day  to avoid getting dehydrated. °Record output from your ileostomy.  Expect to empty the bag every 3-4 hours at first.  Most people with a permanent ileostomy empty their bag 4-6 times at the least.   °Use antidiarrheal medicine (especially Imodium) several times a day to avoid getting dehydrated.  Start with a dose at bedtime & breakfast.  Adjust up or down as needed.  Increase antidiarrheal medications as directed to avoid emptying the bag more than 8 times a day (every 3 hours). °Work with your wound ostomy nurse to learn care for your ostomy.  See ostomy care instructions. °TROUBLESHOOTING IRREGULAR BOWELS °1) Start with a soft & bland diet. No spicy, greasy, or fried foods.  °2) Avoid gluten/wheat or dairy products from diet to see if symptoms improve. °3) Miralax 17gm or flax seed mixed in 8oz. water or juice-daily. May use 2-4 times a day as needed. °4) Gas-X, Phazyme, etc. as needed for gas & bloating.  °5) Prilosec (omeprazole) over-the-counter as needed °6)  Consider probiotics (Align, Activa, etc) to help calm the bowels down ° °Call your doctor if you are getting worse or not getting better.  Sometimes further testing (cultures, endoscopy, X-ray studies, CT scans, bloodwork, etc.) may be needed to help diagnose and treat the cause of the diarrhea. °[facility] °[address], [address], [place], [place]  [address] °[phone] - Main.    °[phone]  - Toll Free.   [phone] - Fax °[email or web address] ° °

## 2018-05-10 NOTE — Interval H&P Note (Signed)
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 After consideration of risks, benefits and other options for treatment, the patient has consented to  Procedure(s): XI ROBOTIC RESECTION OF ILEAL ANAL POUCH WITH CREATION OF PERMANENT ILEOSTOMY. (N/A) LYSIS OF ADHESION (N/A) RECTAL EXAM UNDER ANESTHESIA (N/A) as a surgical intervention .  The patient's history has been reviewed, patient examined, no change in status, stable for surgery.  I have reviewed the patient's chart and labs.  Questions were answered to the patient's satisfaction.    I have re-reviewed the the patient's records, history, medications, and allergies.  I have re-examined the patient.  I again discussed intraoperative plans and goals of post-operative recovery.  The patient agrees to proceed.  [name]  1971/02/11 [id number]  Patient Care Team: Nuala Alpha, DO as PCP - General (Family Medicine) Michael Boston, MD as Consulting Physician (General Surgery) [name], Kirke Corin, MD as Consulting Physician (Gastroenterology) Kyung Rudd, MD as Consulting Physician (Radiation Oncology) Truitt Merle, MD as Consulting Physician (Oncology)  Patient Active Problem List   Diagnosis Date Noted  . 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 10/13/2016    Priority: Medium  . Rectal adenocarcinoma s/p protctocolectomy, IPAA "J" pouch 12/01/2016 08/06/2016    Priority: Medium  . Current mild episode of major depressive disorder without prior episode ([place]) 04/24/2018  . Diarrhea 04/24/2018  . C. difficile diarrhea 03/30/2018  . Situational syncope  03/30/2018  . Tobacco abuse 03/29/2018  . Protein-calorie malnutrition, moderate ([place]) 03/29/2018  . Stricture of ileoanal anastomosis s/p dilitations 01/12/2018  . Muscle spasm 04/11/2017  . Encounter for antineoplastic chemotherapy 04/11/2017  . Hypomagnesemia 12/02/2016  . 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 Family history of colon cancer   . Anxiety 09/14/2016    Past Medical History:  Diagnosis Date  . Anxiety   . Colon cancer ([place]) 12/01/2016  . Depression   . Family history of colon cancer   . Poor dental hygiene     Past Surgical History:  Procedure Laterality Date  . COLON SURGERY    . EUS N/A 07/29/2016   Procedure: LOWER ENDOSCOPIC ULTRASOUND (EUS);  Surgeon: Milus Banister, MD;  Location: [facility];  Service: Endoscopy;  Laterality: N/A;  . ILEOSTOMY CLOSURE N/A 09/22/2017   Procedure: TAKEDOWN OF LOOP ILEOSTOMY;  Surgeon: Michael Boston, MD;  Location: [facility];  Service: General;  Laterality: N/A;  . PORTACATH PLACEMENT N/A 01/26/2017   Procedure: PLACEMENT OF PORT-A-CATH CENTRAL LINE WITH FLUOROSCOPY AND ULTRASOUND;  Surgeon: Michael Boston, MD;  Location: [facility];  Service: General;  Laterality: N/A;  . POUCHOSCOPY N/A 09/21/2017   Procedure: POUCHOSCOPY;  Surgeon: Leighton Ruff, MD;  Location: [facility];  Service: Endoscopy;  Laterality: N/A;  . POUCHOSCOPY N/A 01/12/2018   Procedure: POUCHOSCOPY WITH BIOPSIES;  Surgeon: Leighton Ruff, MD;  Location: [facility];  Service: Endoscopy;  Laterality: N/A;  Local  . PROCTOSCOPY N/A 12/01/2016   Procedure: RIGID PROCTOSCOPY;  Surgeon: Michael Boston, MD;  Location: [facility];  Service: General;  Laterality: N/A;  . TOE AMPUTATION Left   . XI ROBOTIC ASSISTED  LOWER ANTERIOR RESECTION N/A 12/01/2016   Procedure: XI ROBOTIC ASSISTED PROCTOCOLECTOMY WITH ILLEOPOUCH ANASTAMOSIS WITH DIVERTING ILLEOSTOMY;  Surgeon: Michael Boston, MD;  Location: [facility];  Service: General;  Laterality: N/A;     Social History   Socioeconomic History  . Marital status: Divorced    Spouse name: Not on file  . Number of children: 3  . Years of education: Not on file  . Highest education level: Not on file  Occupational History  . Not on file  Social Needs  . Financial resource strain: Not on file  . Food insecurity:    Worry: Not on file    Inability: Not on file  . Transportation needs:    Medical: Not on file    Non-medical: Not on file  Tobacco Use  . Smoking status: Current Every Day Smoker    Packs/day: 1.00    Years: 32.00    Pack years: 32.00    Types: Cigarettes  . Smokeless tobacco: Never Used  Substance and Sexual Activity  . Alcohol use: No  . Drug use: No    Comment: patient denies  . Sexual activity: Yes  Lifestyle  . Physical activity:    Days per week: Not on file    Minutes per session: Not on file  . Stress: Not on file  Relationships  . Social connections:    Talks on phone: Not on file    Gets together: Not on file    Attends religious service: Not on file    Active member of club or organization: Not on file    Attends meetings of clubs or organizations: Not on file    Relationship status: Not on file  . Intimate partner violence:    Fear of current or ex partner: Not on file    Emotionally abused: Not on file    Physically abused: Not on file    Forced sexual activity: Not on file  Other Topics Concern  . Not on file  Social History Narrative   Lives with significant other, [name]   Have a 7 year old son   Smoker      [place]    Family History  Problem Relation Age of Onset  . Diabetes Mother   . COPD Father   . Colon cancer Maternal Aunt        dx in her 60s  . Diabetes Maternal Grandmother   . Brain cancer Maternal Grandfather   . Lung cancer Paternal Grandfather   . Colon cancer Cousin        dx in her 2s  . Bone cancer Sister 8  . Pancreatic cancer Neg Hx   . Esophageal cancer Neg Hx   .  Stomach cancer Neg Hx   . Liver disease Neg Hx     Facility-Administered Medications Prior to Admission  Medication Dose Route Frequency Provider Last Rate Last Dose  . 0.9 %  sodium chloride infusion  500 mL Intravenous Continuous Nelida Meuse III, MD       Medications Prior to Admission  Medication Sig Dispense Refill Last Dose  . ALPRAZolam (XANAX) 0.5 MG tablet Take 1 tablet (0.5 mg total) by mouth 2 (two) times daily as needed for anxiety or sleep. 15 tablet 0 Past Month at Unknown time  . [employer] ZINC OXIDE 20 % ointment Apply 1 application topically daily as needed. For skin irritation.  0 05/09/2018 at Unknown time  . gabapentin (NEURONTIN) 300 MG capsule  Take 1 capsule (300 mg total) by mouth 2 (two) times daily. 60 capsule 3 Past Month at Unknown time  . paregoric 2 MG/5ML solution Take 5-10 mLs by mouth 4 (four) times daily as needed for diarrhea or loose stools.   05/10/2018 at 0800  . sertraline (ZOLOFT) 50 MG tablet Take 1 tablet (50 mg total) by mouth daily. 30 tablet 3 05/10/2018 at 0800    Current Facility-Administered Medications  Medication Dose Route Frequency Provider Last Rate Last Dose  . bupivacaine liposome (EXPAREL) 1.3 % injection 266 mg  20 mL Infiltration On Call to OR Michael Boston, MD      . cefoTEtan (CEFOTAN) 2 g in sodium chloride 0.9 % 100 mL IVPB  2 g Intravenous On Call to OR Michael Boston, MD      . Chlorhexidine Gluconate Cloth 2 % PADS 6 each  6 each Topical Once Michael Boston, MD      . lactated ringers infusion   Intravenous Continuous Montez Hageman, MD 50 mL/hr at 05/10/18 0935     Facility-Administered Medications Ordered in Other Encounters  Medication Dose Route Frequency Provider Last Rate Last Dose  . sodium chloride flush (NS) 0.9 % injection 10 mL  10 mL Intravenous PRN Truitt Merle, MD   10 mL at 11/18/17 0953     No Known Allergies  BP 114/87   Pulse 62   Temp 97.6 F (36.4 C) (Oral)   Resp 18   Ht '5\' 11"'$  (1.803 m)   Wt 68 kg (150 lb)    SpO2 97%   BMI 20.92 kg/m   Labs: No results found for this or any previous visit (from the past 48 hour(s)).  Imaging / Studies: No results found.   Adin Hector, M.D., F.A.C.S. Gastrointestinal and Minimally Invasive Surgery [facility] [address] [place], [place] [address] [phone] Main / Paging  05/10/2018 11:27 AM    Adin Hector

## 2018-05-10 NOTE — Anesthesia Postprocedure Evaluation (Signed)
Anesthesia Post Note  Patient: [name]  Procedure(s) Performed: XI ROBOTIC RESECTION OF ILEAL J POUCH, LYSIS OF ADHESIONS, WITH CREATION OF PERMANENT END ILEOSTOMY. (N/A ) LYSIS OF ADHESION (N/A ) RECTAL EXAM UNDER ANESTHESIA (N/A )     Patient location during evaluation: PACU Anesthesia Type: General Level of consciousness: awake and alert Pain management: pain level controlled Vital Signs Assessment: post-procedure vital signs reviewed and stable Respiratory status: spontaneous breathing, nonlabored ventilation, respiratory function stable and patient connected to nasal cannula oxygen Cardiovascular status: blood pressure returned to baseline and stable Postop Assessment: no apparent nausea or vomiting Anesthetic complications: no    Last Vitals:  Vitals:   05/10/18 1700 05/10/18 1715  BP: (!) 129/93 (!) 140/93  Pulse: 90 93  Resp: 12 15  Temp: (!) 36.3 C 36.6 C  SpO2: 99% 96%    Last Pain:  Vitals:   05/10/18 1700  TempSrc:   PainSc: Asleep                 Montez Hageman

## 2018-05-10 NOTE — Op Note (Signed)
05/10/2018  4:06 PM  PATIENT:  [name]  47 y.o. 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 RECTAL EXAM UNDER ANESTHESIA  SURGEON:  Adin Hector, MD, FACS, MASCRS  ASSISTANTS:  Nadeen Landau, MD Carlena Hurl, PA-C  ANESTHESIA:   General, Local anesthetic as a field block and anorectal and perianal field block}  EBL:  Total I/O In: 2000 [I.V.:2000] Out: 300 [Urine:200; Blood:100] "see anesthesia record"  Delay start of Pharmacological VTE agent (>24hrs) due to surgical blood loss or risk of bleeding:  no  DRAINS: 19 Fr Blake drain in the pelvis   SPECIMEN:  Source of Specimen:  ILEAL J POUCH  DISPOSITION OF SPECIMEN:  PATHOLOGY  COUNTS:  YES  PLAN OF CARE: Admit to inpatient   PATIENT DISPOSITION:  PACU - hemodynamically stable.  INDICATION:    Unfortunate gentleman with Lynch syndrome and rectal cancer status post chemoradiation therapy and low anterior resection with completion proctocolectomy and ileal J-pouch.  No evidence of cancer recurrence.  However has had persistent J-pouch diarrhea and urge incontinence.  Extensive work-up to disprove pouchitis.  Refractory to antibiotics and aggressive antidiarrheal regimen.  Mild ileoanal anastomotic stricturing corrected with dilatation and pelvic floor physical therapy and still struggling.  He wished to resect pouch and convert to ileostomy only.  I recommended  segmental resection:  The anatomy & physiology of the digestive tract was discussed.  The pathophysiology was discussed.  Natural history risks without surgery was discussed.   I worked to give an overview of the disease and the frequent need to have multispecialty involvement.  I feel the risks of no intervention will lead to serious problems that outweigh the operative risks; therefore, I recommended a partial colectomy to remove the pathology.  Laparoscopic & open techniques were discussed.   Risks such as bleeding, infection, abscess, leak, reoperation, possible ostomy, hernia, heart attack, death, and other risks were discussed.  I noted a good likelihood this will help address the problem.   Goals of post-operative recovery were discussed as well.  We will work to minimize complications.  Educational materials on the pathology had been given in the office.  Questions were answered.    The patient expressed understanding & wished to proceed with surgery.  OR FINDINGS:   Patient had intact ileoanal J-pouch anastomosis.  No intra-abdominal adhesions.  Moderate pelvic adhesions between the ileal J-pouch.  No obvious metastatic disease on visceral parietal peritoneum or liver.  Is an end distal ileostomy that rests in the right upper quadrant at the premarked location, slightly cephalad of the prior loop ileostomy.  PROCEDURE:  Informed consent was confirmed.  The patient underwent general anaesthesia without difficulty.  The patient was positioned appropriately.  VTE prevention in place.  The patient's abdomen was clipped, prepped, & draped in a sterile fashion.  Surgical timeout confirmed our plan.  The patient was positioned in reverse Trendelenburg.  Abdominal entry was gained using Varess technique in the left subcostal ridge of the upper abdomen.  Port placed in the  upper abdomen.  Camera inspection revealed no injury.  Extra ports were carefully placed under direct laparoscopic  visualization.  No adhesions to the anterior abdominal wall.  Greater omental adhesions to the left side of the pelvis over the J-pouch. The patient was carefully positioned.  The Intuitive daVinci robot was carefully docked with camera & instruments carefully placed.  The patient had no evidence of visceral or parietal peritoneal metastatic disease.   Freed off some wispy adhesions of greater omentum off the left pelvis and pouch circumferentially.  Freed off the ileal mesentery off the right and retroperitoneum and right pelvis.  Worked to circumferentially dissect free the ileal J-pouch.  Initially used sharp scissor dissection.  Transition to vessel sealer posteriorly as we were freeing the pouch mesentery off the sacrum.  I ended up leaving some ileal J-pouch mesentery on the sacrum to avoid any nerve or other sacral venous injury.  Pouch was friable and of poor tissue quality.  However no major inflammation.  Eventually did get into the pouch left posteriorly but was able to elevate and come around circumferentially down to the pelvic floor.  Mobilized the distal ileal mesentery off the right lower quadrant retroperitoneum and pelvis.  I located viable ileum just proximal to the J-pouch.  I radially transected the ileal mesentery just proximal to the J pouch with a healthy ileum that was reaching to the lower anterior pelvis.  I ended up transecting the J-pouch at the level of the ileoanal staple line with sharp scissors after confirming that there was just a 1 cm anorectal cuff remaining above the sphincters.  Greater omentum was brought down and brought out the anus and clamped.  With that, the resected ileal J pouch and ileum could reach into the right upper quadrant at the premarked ileostomy site.  I did copious irrigation.  Hemostasis is good.  I placed a drain through the RLQ port site down into the pelvis.  I removed CO2 gas out through the ports.  Excised the skin around the right upper quadrant  paramedian premarked ileostomy port site.  Excised a disc of skin and subcutaneous tissue.  I split the anterior rectus fascia transversely.  I split the rectus muscle.  Able to get into the peritoneum.  I placed a wound protector.  We eviscerated the ileal J-pouch and ileum.  Saw the obvious transition point.  Stapled off with a GIA stapler.  Specimen sent off.  I checked orientation and make sure that the ileum mesentery was straight and there is no twisting of the ileum.    Drain secured with 2-0 Prolene.  Other port sites closed using Monocryl stitch and sterile dressing.  Ileostomy created with 2-0 Vicryl sutures and 4 quadrants at the fascia for Brooke fashion.  The mesentery inferior at the 6 o'clock position.  Used interrupted Vicryl sutures to help Brooke a good [place] ileostomy about 3 cm.  Intubated well and straight.  Appliance placed.  I then closed the anal canal with 2-0 Vicryl interrupted sutures taking a few bites of omentum to help have the omentum laid over the anal canal closure and very deep pelvis.  I closed the internal sphincters with interrupted Vicryl suture.  Closed the external sphincters with interrupted vicryl suture.  Watertight seal.  The patient was extubated and is stable in the PACU recovery room. I discussed postop care with the patient & his wife in detail the office & again in the holding area. Instructions are written. I updated the  status of the patient to the patient's wife.  I made recommendations.  I answered questions.  Understanding & appreciation was expressed by her.  Adin Hector, M.D., F.A.C.S. Gastrointestinal and Minimally Invasive Surgery [facility] [address], [place], [place] [address] [phone] Main / Paging

## 2018-05-10 NOTE — Transfer of Care (Signed)
Immediate Anesthesia Transfer of Care Note  Patient: [name]  Procedure(s) Performed: XI ROBOTIC RESECTION OF ILEAL J POUCH, LYSIS OF ADHESIONS, WITH CREATION OF PERMANENT END ILEOSTOMY. (N/A ) LYSIS OF ADHESION (N/A ) RECTAL EXAM UNDER ANESTHESIA (N/A )  Patient Location: PACU  Anesthesia Type:General  Level of Consciousness: sedated  Airway & Oxygen Therapy: Patient Spontanous Breathing and Patient connected to face mask oxygen  Post-op Assessment: Report given to RN and Post -op Vital signs reviewed and stable  Post vital signs: Reviewed and stable  Last Vitals:  Vitals Value Taken Time  BP    Temp    Pulse    Resp    SpO2      Last Pain:  Vitals:   05/10/18 0925  TempSrc:   PainSc: 0-No pain         Complications: No apparent anesthesia complications

## 2018-05-10 NOTE — H&P (Signed)
[name]  DOB: [birth year] Married / Language: [name] / Race: White Male  Patient Care Team: Nuala Alpha, DO as PCP - General (Family Medicine) Michael Boston, MD as Consulting Physician (General Surgery) [name], Kirke Corin, MD as Consulting Physician (Gastroenterology) Kyung Rudd, MD as Consulting Physician (Radiation Oncology) Truitt Merle, MD as Consulting Physician (Oncology)    History of Present Illness Adin Hector MD; 03/20/2018 5:53 PM) The patient is a 47 year old male who presents with a complaint of diarrhea. Note for "Diarrhea": ` ` ` The patient is a 47 year old male presenting for a post-operative visit. ` ` The patient returns s/p takedown of loop ileostomy. History of [facility] SYNDROME WITH RECTAL CANCER status post proctocolectomy with ileoanal anastomosis and diverting loop ileostomy 12/01/2016.  EUA and loop ileostomy takedown 09/21/2017  Pouchoscopy with coloanal stricture dilation 01/12/2018  The returns to clinic after surgery, again struggling with diarrhea. He brings in his wife today. He still taking fiber. He still taking iron. He did start taking the paregoric. He notes he was taking paregoric up to 4 times a day. That was slowing things down. However he's cut back to 2 times a day. Has some good days now. This month is better than before. However, he still having some very bad days. Sometimes he still will have severe days with having 10 bowel movements in an hour. Over 20 in a day. Can't hold it in. Leaking. Wearing diapers. He has been using the Hegar dilators twice a day. He like he can have larger bowel movements after that. He still very raw. Using A&D ointment for painful rash. No nausea or vomiting. Patient & wife wondering if he can get an ileostomy now.   ` ` 09/22/2017  3:02 PM  PATIENT: [name] 47 y.o. male  Patient Care Team: Patient, No Pcp Per as PCP - General ([place]) Michael Boston,  MD as Consulting Physician (General Surgery) [name], Kirke Corin, MD as Consulting Physician (Gastroenterology) Kyung Rudd, MD as Consulting Physician (Radiation Oncology) Truitt Merle, MD as Consulting Physician (Oncology)  PRE-OPERATIVE DIAGNOSIS: [facility] SYNDROME WITH RECTAL CANCER status post proctocolectomy with ileoanal anastomosis and diverting loop ileostomy.  POST-OPERATIVE DIAGNOSIS: [facility] SYNDROME WITH RECTAL CANCER status post proctocolectomy with ileoanal anastomosis and diverting loop ileostomy.  PROCEDURE:  TAKEDOWN OF LOOP ILEOSTOMY  SURGEON: Adin Hector, MD  ASSISTANT: Quincy Sheehan, [name], [employer]   ANESTHESIA: local and general  EBL: Total I/O In: - Out: 25 [Blood:25]  Delay start of Pharmacological VTE agent (>24hrs) due to surgical blood loss or risk of bleeding: no  DRAINS: none  SPECIMEN: Source of Specimen: LOOP ILEOSTOMY  DISPOSITION OF SPECIMEN: PATHOLOGY  COUNTS: YES  PLAN OF CARE: Admit to inpatient  PATIENT DISPOSITION: PACU - hemodynamically stable.  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 Completed chemotherapy and is interested in ileostomy takedown. Contrast enema and pouchoscopy negative for any leak or abnormalities. He wished to proceed. The patient has recovered from that surgery with the anastomosis well-healed. It was felt safe to have the loop ileostomy taken down. I discussed the procedure with the patient:  The anatomy & physiology of the digestive tract was discussed. The pathophysiology was discussed. Possibility of remaining with an ostomy permanently was discussed. I offered ostomy takedown. Laparoscopic & open techniques were discussed.  Risks such as bleeding, infection, abscess, leak, reoperation, possible  re-ostomy, injury to other organs,  hernia, heart attack, death, and other risks were discussed. I noted a good likelihood this will help address the problem. Goals of post-operative recovery were discussed as well. We will work to minimize complications. Questions were answered. The patient expresses understanding & wishes to proceed with surgery.  OR FINDINGS: Normal anatomy.  DESCRIPTION:  Informed consent was confirmed. The patient received IV antibiotics & underwent general anesthesia without any difficulty. Foley catheter was sterilely placed. SCDs were active during the entire case. The abdomen was prepped and draped in a sterile fashion. A surgical timeout confirmed our plan.  I made a biconcave curvilinear incision transversely around the loop ileostomy. I got into the subcutaneous tissues. I used careful focused right angle dissection and sharp dissection. Some focused cautery dissection as well. That helped to free adhesions to the subcutaneous tisses & fascia. Gradually, I was able to enter into the peritoneum focally. I did a gentle finger sweep. Gradually came around circumferentially and freed the loop of ileum from remaining adhesions to the abdominal wall. We were able eviscerate some bowel proximally and distally.   I did a side-to-side stapled anastomosis using a 75 GIA. Silk stitch placed at the crotch of the anastomosis. I used a TX stapler to staple off the common defect and resect the remaining ileum, most of it that involve the intra-abdominal ostomy component given the adhesions. . Took the mesentery with clamps and silk ties. I closed the mesenteric defect using interrupted silk stitches. Used the mesentery to help cover and protect the TX staple line. The anastomosis looked healthy and viable. We returned the anastomosis into the abdominal cavity. Finger sweep circumferentially noted no adhesions. It rested well.  We changed gloves and instruments. Irrigated copiously into the  wound and fascia. I reapproximated the fascia transversely using #1 PDS stitches. I irrigated into the subcutaneous tissues. I did to a layer of interrupted 2-0 Vicryl deep dermal sutures to help close the wound and dead space down. I reapproximated the skin using 4-0 Monocryl stitch running centrally. I excised the ends of the closure to remove dog-earing. I left the corners open. I packed the corners with anitbiotic soaked umbilical tape for wicks. Sterile dressing was applied.  Patient was extubated and is stable in the recovery room. I discussed operative findings, updated the patient's status, discussed probable steps to recovery, and gave postoperative recommendations to the patient's significant other. Recommendations were made. Questions were answered. She expressed understanding & appreciation.   Adin Hector, M.D., F.A.C.S. Gastrointestinal and Minimally Invasive Surgery [facility] [address], [place], [place] [address] [phone] Main / Paging   Medication History Lars Mage [place], [place]; 03/20/2018 3:52 PM) Medications Reconciled  Vitals (Alisha Spillers CMA; 03/20/2018 3:52 PM) 03/20/2018 3:52 PM Weight: 153 lb Height: 72in Body Surface Area: 1.9 m Body Mass Index: 20.75 kg/m  BP: 132/80 (Sitting, Left Arm, Standard)       Physical Exam Adin Hector MD; 03/20/2018 4:29 PM) General Mental Status-Alert. General Appearance-Not in acute distress. Voice-Normal. Note: Relaxed. Nontoxic. Moving and walking around more easily. Does not look fatigued nor sickly   Integumentary Global Assessment Upon inspection and palpation of skin surfaces of the - Distribution of scalp and body hair is normal. General Characteristics Overall examination of the patient's skin reveals - no rashes and no suspicious lesions.  Head and Neck Head-normocephalic, atraumatic with no lesions or palpable  masses. Face Global Assessment - atraumatic, no  absence of expression. Neck Global Assessment - no abnormal movements, no decreased range of motion. Trachea-midline. Thyroid Gland Characteristics - non-tender.  Eye Eyeball - Left-Extraocular movements intact, No Nystagmus. Eyeball - Right-Extraocular movements intact, No Nystagmus. Upper Eyelid - Left-No Cyanotic. Upper Eyelid - Right-No Cyanotic.  Chest and Lung Exam Inspection Accessory muscles - No use of accessory muscles in breathing.  Abdomen Note: Incision R side closed. No hernia. No active bleeding. No cellulitis. No guarding/rebound tenderness   Rectal Note: Significant perineal perianal pruritus rash. Worse. Intact sphincter tone. Staple line 15 mm from anal verge posterior midline. Not really strictured. Probably 15 mm diameter. The best it's been. Can tolerate digital exam. Sensitive but easily allows finger   Peripheral Vascular Upper Extremity Inspection - Left - Not Gangrenous, No Petechiae. Right - Not Gangrenous, No Petechiae.  Neurologic Neurologic evaluation reveals -normal attention span and ability to concentrate, able to name objects and repeat phrases. Appropriate fund of knowledge and normal coordination.  Neuropsychiatric Mental status exam performed with findings of-able to articulate well with normal speech/language, rate, volume and coherence and no evidence of hallucinations, delusions, obsessions or homicidal/suicidal ideation. Orientation-oriented X3.  Musculoskeletal Global Assessment Gait and Station - normal gait and station.  Lymphatic General Lymphatics Description - No Generalized lymphadenopathy.    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 Improved with dilation of coloanal stricture and paregoric/fiber regimen.  History of  intermittent compliance in the past. He's got much better about it. Cut back on his smoking, using the Hegar dilators twice a day, staying on fiber & Paregoric for the most part. It is hard to tease out if he truly is better. I think for the most part he is at least having some good days now. His wife agrees. However the bad days arestill horrible  I think he tried to self-wean off the paregoric and it backfired. I strongly recommend he get back up to 4 doses a day where both he and his wife agreed he was getting better. He can gradually increase the dosing until <8BMs/day.  If his diarrhea is controlled, stricture remains absent, no longer needs to be wear pads or diapers and pruritus rash goes away; then, hold off on pouch excision and ileostomy.  If not, excision of ileoanal pouch, oversew sphincters, omental flap coverage of pelvic floor, and permanent ileostomy. d/w Drs Dema Severin & Marcello Moores. It is past the 6 month period, over a year since the pouch creation, so adhesions wound be as minimal as possible. However, this could be quite prolonged case. Would probably do transanal approach to get through pouch and closedown sphincters. Then robotically assisted pouch excision and ileostomy creation. While we were getting close to running out of options, it is not totally exhausted yet. I think the patient wife feel validated that the option is on the table at least so they agreed to increase the paregotic. I told him not to wean himself off it for several months. Wait until the rash is gone, episodes of accidents and incontinence are minimal before even considering backing off of the paregoric. I cautioned he may need to be on something permanently.  Quit smoking! I congratulated him pain down to just a few cigarettes a day, but cautioned that Current Plans Pt Education - [facility] Free Text Education/Instructions: discussed with patient and provided information. Changed Paregoric 2 MG/5ML Oral Tincture, 10  Milliliter four times daily, as  needed, 500 Milliliter, 03/20/2018, Ref. x5. Local Order: to control diarrhea Follow up if no improvement or if symptoms worsen PRURITUS ANI (L29.0) Current Plans Pt Education - [facility] Pruritus Ani (AT) Pt Education - [facility] Good Bowel Health (Schylar Allard) ANAL STRICTURE (K62.4) Impression: Stricturing and coloanal stapled anastomosis. Improved after digital dilation. Pouchoscopy reassuring of no major issues.    HX CANCER OF COLORECTAL REGION - F/U PRN (Z85.048) Impression: No obvious local recurrence. Per Dr. Burr Medico HNPCC5 (Z15.09) Impression: Cancer risks from Lynch syndrome, status post total proctoscopy colectomy with ileal J-pouch anastomosis.  Med Onc (Dr Burr Medico) previously recommended to him to have EGD every 3-5 years, annual urinalysis for GU cancer screening.   Addendum: Patient with persistent diarrhea despite high-dose paregoric and other regimens.  No pouchitis.  No stricture.  No cancer recurrence.  Request is J pouch & permanent ileostomy.  Discussed with colorectal partners, Dr Marcello Moores & White.  We'll proceed.  Patient understands this is a permanent ileostomy and recreation of a pouch not possible in the future.  The anatomy & physiology of the digestive tract was discussed.  The pathophysiology of the colon was discussed.  Natural history risks without surgery was discussed.   I feel the risks of no intervention will lead to serious problems that outweigh the operative risks; therefore, I recommended a partial colectomy to remove the pathology.  Minimally invasive (Robotic/Laparoscopic) & open techniques were discussed.   Risks such as bleeding, infection, abscess, leak, reoperation, injury to other organs, need for repair of tissues / organs, possible ostomy, hernia, heart attack, stroke, death, and other risks were discussed.  I noted a good likelihood this will help address the problem.   Goals of post-operative recovery were discussed as well.   Need  for adequate nutrition, daily bowel regimen and healthy physical activity, to optimize recovery was noted as well. We will work to minimize complications.  Educational materials were available as well.  Questions were answered.  The patient expresses understanding & wishes to proceed with surgery.   Adin Hector, M.D., F.A.C.S. Gastrointestinal and Minimally Invasive Surgery [facility] [address] [place],  [address] [phone] Main / Paging

## 2018-05-11 ENCOUNTER — Inpatient Hospital Stay: Payer: Medicaid Other | Attending: Hematology

## 2018-05-11 ENCOUNTER — Encounter (HOSPITAL_COMMUNITY): Payer: Self-pay | Admitting: Surgery

## 2018-05-11 DIAGNOSIS — R159 Full incontinence of feces: Secondary | ICD-10-CM

## 2018-05-11 LAB — BASIC METABOLIC PANEL
Anion gap: 10 (ref 5–15)
BUN: 7 mg/dL (ref 6–20)
CO2: 23 mmol/L (ref 22–32)
CREATININE: 1.02 mg/dL (ref 0.61–1.24)
Calcium: 8 mg/dL — ABNORMAL LOW (ref 8.9–10.3)
Chloride: 103 mmol/L (ref 101–111)
GFR calc Af Amer: >60 mL/min (ref >60)
Glucose, Bld: 178 mg/dL — ABNORMAL HIGH (ref 65–99)
Potassium: 3.8 mmol/L (ref 3.5–5.1)
SODIUM: 136 mmol/L (ref 135–145)

## 2018-05-11 LAB — CBC
HCT: 40.3 % (ref 39.0–52.0)
Hemoglobin: 13.8 g/dL (ref 13.0–17.0)
MCH: 30 pg (ref 26.0–34.0)
MCHC: 34.2 g/dL (ref 30.0–36.0)
MCV: 87.6 fL (ref 78.0–100.0)
PLATELETS: 189 10*3/uL (ref 150–400)
RBC: 4.6 MIL/uL (ref 4.22–5.81)
RDW: 14.1 % (ref 11.5–15.5)
WBC: 16.3 10*3/uL — ABNORMAL HIGH (ref 4.0–10.5)

## 2018-05-11 LAB — MAGNESIUM: Magnesium: 1.5 mg/dL — ABNORMAL LOW (ref 1.7–2.4)

## 2018-05-11 MED ORDER — MAGNESIUM SULFATE 4 GM/100ML IV SOLN
4.0000 g | Freq: Once | INTRAVENOUS | Status: AC
  Administered 2018-05-11: 4 g via INTRAVENOUS
  Filled 2018-05-11: qty 100

## 2018-05-11 MED ORDER — LACTATED RINGERS IV BOLUS: 1000.0000 mL | Freq: Three times a day (TID) | INTRAVENOUS | Status: AC | PRN

## 2018-05-11 NOTE — Progress Notes (Signed)
Initial Nutrition Assessment  INTERVENTION:   Continue Ensure Surgery BID, each provides 330 kcal and 18g protein   NUTRITION DIAGNOSIS:   Increased nutrient needs related to post-op healing as evidenced by estimated needs.  GOAL:   Patient will meet greater than or equal to 90% of their needs  MONITOR:   PO intake, Supplement acceptance, Weight trends, Labs, I & O's  REASON FOR ASSESSMENT:   Malnutrition Screening Tool    ASSESSMENT:   47 y.o. 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  Completed chemotherapy and is interested in ileostomy takedown.  5/22: s/p XI ROBOTIC RESECTION OF ILEAL J POUCH LYSIS OF ADHESIONS PERMANENT END ILEOSTOMY. RECTAL EXAM UNDER ANESTHESIA   Pt reports good appetite and this has not changed. Pt states he ate grits and eggs this morning with no issue. Pt with Ensure Surgery supplement at bedside and states he plans to drink it. Encouraged patient to continue supplements during admission and after discharge as well. Pt states Ensure/Boost gets expensive, RD provided ideas for more affordable protein options. PTA pt was having chronic diarrhea. States it would cause painful BMs to eat any food that was too solid such as meats. Pt subsisted on foods such as crackers, sandwiches, soups, etc.  Pt states UBW of 145-151 lb. Weight is currently stable.  Medications: Mg sulfate once Labs reviewed:  Low Mg  NUTRITION - FOCUSED PHYSICAL EXAM:    Most Recent Value  Orbital Region  No depletion  Upper Arm Region  Mild depletion  Thoracic and Lumbar Region  Unable to assess  Buccal Region  Mild depletion  Temple Region  Mild depletion  Clavicle Bone Region  Mild depletion  Clavicle and Acromion Bone Region  Mild depletion  Scapular Bone Region  Unable to assess  Dorsal Hand  Mild depletion  Patellar Region  Unable to assess  Anterior  Thigh Region  Unable to assess  Posterior Calf Region  Unable to assess  Edema (RD Assessment)  None       Diet Order:   Diet Order           DIET - DYS 1 Room service appropriate? Yes; Fluid consistency: Thin  Diet effective now          EDUCATION NEEDS:   Education needs have been addressed  Skin:  Skin Assessment: Reviewed RN Assessment  Last BM:  5/23 -50 ml   Height:   Ht Readings from Last 1 Encounters:  05/10/18 5\' 11"  (1.803 m)    Weight:   Wt Readings from Last 1 Encounters:  05/10/18 150 lb (68 kg)    Ideal Body Weight:  78.2 kg  BMI:  Body mass index is 20.92 kg/m.  Estimated Nutritional Needs:   Kcal:  1700-1900  Protein:  80-90g  Fluid:  1.9L/day   Clayton Bibles, MS, RD, LDN [place] Dietitian Pager: [phone] After Hours Pager: [phone]

## 2018-05-11 NOTE — Progress Notes (Signed)
[facility]  [address] [place], [place] [address] Phone: [phone]  FAX: [phone]      ZERIC BARANOWSKI [id number] 27-Aug-1971  CARE TEAM:  PCP: Nuala Alpha, DO  Outpatient Care Team: Patient Care Team: Nuala Alpha, DO as PCP - General (Family Medicine) Michael Boston, MD as Consulting Physician (General Surgery) [place], Kirke Corin, MD as Consulting Physician (Gastroenterology) Kyung Rudd, MD as Consulting Physician (Radiation Oncology) Truitt Merle, MD as Consulting Physician (Oncology)  Inpatient Treatment Team: Treatment Team: Attending Provider: Michael Boston, MD; Registered Nurse: Jeannie Fend, RN; Registered Nurse: Vicente Serene, RN   Problem List:   Principal Problem:   Chronic diarrhea s/p proctocolectomy & ileoanal J pouch Active Problems:   Rectal adenocarcinoma s/p protctocolectomy, 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 RECTAL EXAM UNDER ANESTHESIA  SURGEON:  Adin Hector, MD, FACS, MASCRS    Assessment  OK so far  Plan:  -Dysphagia 1 diet.  Advance to solids as tolerated.  Remove Foley.  Ileostomy care and training.  Follow off IV fluids with as needed boluses.  -replace low Mag  -VTE prophylaxis- SCDs, etc -mobilize as tolerated to help recovery  20 minutes spent in review, evaluation, examination, counseling, and coordination of care.  More than 50% of that time was spent in counseling.  Adin Hector, M.D., F.A.C.S. Gastrointestinal and Minimally Invasive Surgery [facility] [address] [place], [place] [address] [phone]  Main / Paging   05/11/2018    Subjective: (Chief complaint)  Pain controlled.  Tolerating liquids.  No nausea.  Objective:  Vital signs:  Vitals:   05/10/18 1910 05/10/18 1951 05/10/18 2327 05/11/18 0414  BP: (!) 142/98 (!) 131/95 115/79 110/69  Pulse: 83 84 66 (!) 58  Resp: _0 Temp: 98.3 F (36.8 C) 98.4 F (36.9 C) 98.6 F (37 C) 98.1 F (36.7 C)  TempSrc: Oral Oral Oral Oral  SpO2: 100% 99% 100% 99%  Weight:      Height:           Intake/Output   Yesterday:  05/22 0701 - 05/23 0700 In: 9675 [P.O.:1200; I.V.:2950] Out: 3880 [Urine:3400; Drains:380; Blood:100] This shift:  No intake/output data recorded.  Bowel function:  Flatus: No  BM:  No  Drain: Serosanguinous   Physical Exam:  General: Pt awake/alert/oriented x4 in no acute distress Eyes: PERRL, normal EOM.  Sclera clear.  No icterus Neuro: CN II-XII intact w/o focal sensory/motor deficits. Lymph: No head/neck/groin lymphadenopathy Psych:  No delerium/psychosis/paranoia HENT: Normocephalic, Mucus membranes moist.  No thrush Neck: Supple, No tracheal deviation Chest: No chest wall pain w good excursion CV:  Pulses intact.  Regular rhythm MS: Normal AROM mjr joints.  No obvious deformity  Abdomen: Soft.  Nondistended.  Mildly tender at incisions only.  No evidence of peritonitis.  No incarcerated hernias. Rectal : No drainage Ext:   No deformity.  No mjr edema.  No cyanosis Skin: No petechiae / purpura  Results:   Labs: Results for orders placed or performed during the hospital encounter of 05/10/18 (from the past 48 hour(s))  Basic metabolic panel  Status: Abnormal   Collection Time: 05/11/18  5:28 AM  Result Value Ref Range   Sodium 136 135 - 145 mmol/L   Potassium 3.8 3.5 - 5.1 mmol/L   Chloride 103 101 - 111 mmol/L   CO2 23 22 - 32 mmol/L   Glucose, Bld 178 (H) 65 - 99 mg/dL   BUN 7 6 - 20 mg/dL   Creatinine, Ser 1.02 0.61 - 1.24 mg/dL   Calcium 8.0 (L) 8.9 -  10.3 mg/dL   GFR calc non Af Amer >60 >60 mL/min   GFR calc Af Amer >60 >60 mL/min    Comment: (NOTE) The eGFR has been calculated using the CKD EPI equation. This calculation has not been validated in all clinical situations. eGFR's persistently <60 mL/min signify possible Chronic Kidney Disease.    Anion gap 10 5 - 15    Comment: Performed at [facility], [place] [address] [place], [place] [address]  CBC     Status: Abnormal   Collection Time: 05/11/18  5:28 AM  Result Value Ref Range   WBC 16.3 (H) 4.0 - 10.5 K/uL   RBC 4.60 4.22 - 5.81 MIL/uL   Hemoglobin 13.8 13.0 - 17.0 g/dL   HCT 40.3 39.0 - 52.0 %   MCV 87.6 78.0 - 100.0 fL   MCH 30.0 26.0 - 34.0 pg   MCHC 34.2 30.0 - 36.0 g/dL   RDW 14.1 11.5 - 15.5 %   Platelets 189 150 - 400 K/uL    Comment: Performed at [facility], [place] [address], [place], [place] [address]  Magnesium     Status: Abnormal   Collection Time: 05/11/18  5:28 AM  Result Value Ref Range   Magnesium 1.5 (L) 1.7 - 2.4 mg/dL    Comment: Performed at [facility], [place] [address] [place], [place] [address]    Imaging / Studies: No results found.  Medications / Allergies: per chart  Antibiotics: Anti-infectives (From admission, onward)   Start     Dose/Rate Route Frequency Ordered Stop   05/11/18 0000  cefoTEtan (CEFOTAN) 2 g in sodium chloride 0.9 % 100 mL IVPB     2 g 200 mL/hr over 30 Minutes Intravenous Every 12 hours 05/10/18 1726 05/11/18 0008   05/10/18 1520  clindamycin (CLEOCIN) 900 mg, gentamicin (GARAMYCIN) 240 mg in sodium chloride 0.9 % 1,000 mL for intraperitoneal lavage  Status:  Discontinued       As needed 05/10/18 1526 05/10/18 1612   05/10/18 1200  clindamycin (CLEOCIN) 900 mg, gentamicin (GARAMYCIN) 240 mg in sodium chloride 0.9 % 1,000 mL for intraperitoneal lavage  Status:  Discontinued    Note to Pharmacy:  Have in the  [place] room for final irrigation in bowel surgery case to  minimize risk of abscess/infection Pharmacy may adjust dosing strength, schedule, rate of infusion, etc as needed to optimize therapy    Intraperitoneal On call to O.R. 05/10/18 1158 05/10/18 1707   05/10/18 1015  cefoTEtan (CEFOTAN) 2 g in sodium chloride 0.9 % 100 mL IVPB  Status:  Discontinued     2 g 200 mL/hr over 30 Minutes Intravenous On call to O.R. 05/10/18 1003 05/10/18 1707   05/10/18 0900  cefoTEtan in Dextrose 5% (CEFOTAN) IVPB 2 g  Status:  Discontinued     2 g Intravenous On call to O.R. 05/10/18 0859 05/10/18 1005        Note: Portions of this report may have been transcribed using voice  recognition software. Every effort was made to ensure accuracy; however, inadvertent computerized transcription errors may be present.   Any transcriptional errors that result from this process are unintentional.     Adin Hector, M.D., F.A.C.S. Gastrointestinal and Minimally Invasive Surgery [facility] [address] [place], [place] [address] [phone] Main / Paging   05/11/2018

## 2018-05-12 MED ORDER — PSYLLIUM 95 % PO PACK
1.0000 | PACK | Freq: Every day | ORAL | Status: DC
  Administered 2018-05-12: 1 via ORAL
  Filled 2018-05-12 (×2): qty 1

## 2018-05-12 MED ORDER — LOPERAMIDE HCL 2 MG PO CAPS
2.0000 mg | ORAL_CAPSULE | Freq: Three times a day (TID) | ORAL | Status: DC | PRN
  Administered 2018-05-12: 2 mg via ORAL
  Filled 2018-05-12: qty 1

## 2018-05-12 MED ORDER — LACTATED RINGERS IV BOLUS: 1000.0000 mL | Freq: Three times a day (TID) | INTRAVENOUS | Status: AC | PRN

## 2018-05-12 NOTE — Consult Note (Signed)
[facility] Nurse ostomy consult note Stoma type/location:  RUQ, end ileostomy Stomal assessment/size: 1 1/2" round, pink, budded stoma Peristomal assessment: intact, slight dip in the abdominal topography from 9-11 o'clock.  Stoma is well budded so this may not cause any issues for patient and os is centrally located.  Explained that this dip may need to have 1/2 of a barrier ring added if he has issues with any leakage at this sight.  Patient understands possible need for this.   Treatment options for stomal/peristomal skin: NA Output liquid dark stool Ostomy pouching:2pc. 2 1/4"  Education provided:  Reviewed pouch change with patient [place] and patient changed pouch today. He is familiar with steps from previous stoma. Discussed risk for dehydration with patient and need to replace fluids when he empties pouch Discussed risk for food blockages and ways to prevent.  Enrolled patient in [place] program: Yes, previously.  Will update [facility] and send new starter kit for patient to his home.  3 additional skin barriers and pouches left in the room for patient use. Barrier rings as well   WOC Nurse will follow along with you for continued support with ostomy teaching and care [place] MSN, [place], [place], [place], [place]

## 2018-05-12 NOTE — Progress Notes (Addendum)
[facility]  [address] [place], [place] [address] Phone: [phone]  FAX: [phone]      [name] [id number] 17-Aug-1971  CARE TEAM:  PCP: Nuala Alpha, DO  Outpatient Care Team: Patient Care Team: Nuala Alpha, DO as PCP - General (Family Medicine) Michael Boston, MD as Consulting Physician (General Surgery) [place], Kirke Corin, MD as Consulting Physician (Gastroenterology) Kyung Rudd, MD as Consulting Physician (Radiation Oncology) Truitt Merle, MD as Consulting Physician (Oncology)  Inpatient Treatment Team: Treatment Team: Attending Provider: Ileana Roup, MD; Registered Nurse: Jeannie Fend, RN; Registered Nurse: Vicente Serene, RN; Registered Nurse: Rossie Muskrat, RN; Consulting Physician: Michael Boston, MD   Problem List:   Principal Problem:   Incontinence of feces with fecal urgency s/p ileostomy 05/11/2018 Active Problems:   Rectal adenocarcinoma s/p protctocolectomy, 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 RECTAL EXAM UNDER ANESTHESIA  SURGEON:  Adin Hector, MD, FACS, MASCRS    Assessment  Doing reasonably well  Plan:  -GI soft diet today  Ileostomy care and training.  Follow off IV fluids with as needed boluses.  -labs tomorrow  -VTE prophylaxis- Lovenox, SCDs -mobilize as tolerated to help recovery  20 minutes spent in review, evaluation, examination, counseling, and coordination of care.  More than 50% of that time was spent in counseling.  Sharon Mt. Dema Severin, M.D. General and  [facility]   05/12/2018    Subjective: (Chief complaint)  Pain controlled. Ileostomy now putting toothpaste consistency stool  Tolerating dysphagia 1  No nausea or emesis  Objective:  Vital signs:  Vitals:   05/11/18 1320 05/11/18 1700 05/11/18 2105 05/12/18 0537  BP: 127/77  125/88 129/88  Pulse: 61  64 85  Resp: '16  18 18  '$ Temp: 97.6 F (36.4 C)  98.8 F (37.1 C) 99 F (37.2 C)  TempSrc: Oral  Oral Oral  SpO2: 99%  96% 95%  Weight:  100.6 kg (221 lb 11.2 oz)  71.7 kg (158 lb 1.1 oz)  Height:  '6\' 1"'$  (1.854 m)         Intake/Output   Yesterday:  05/23 0701 - 05/24 0700 In: 480 [P.O.:480] Out: 1235 [Urine:1150; Drains:35; Stool:50] This shift:  No intake/output data recorded.  Bowel function:  Flatus: YES  BM:  YES  Drain: Serosanguinous   Physical Exam:  General: Pt awake/alert/oriented x4 in no acute distress Eyes: PERRL, normal EOM.  Sclera clear.  No icterus Neuro: CN II-XII intact w/o focal sensory/motor deficits. Lymph: No head/neck/groin lymphadenopathy Psych:  No delerium/psychosis/paranoia HENT: Normocephalic, Mucus membranes moist.  No thrush Neck: Supple, No tracheal deviation Chest: No chest wall pain w good excursion CV:  Pulses intact.  Regular rhythm MS: Normal AROM mjr joints.  No obvious deformity  Abdomen: Soft.  Nondistended.  Mildly tender at incisions only.  No evidence of peritonitis.  No incarcerated hernias. Rectal : No drainage Ext:   No deformity.  No mjr edema.  No cyanosis Skin: No petechiae / purpura  Results:   Labs: Results for orders placed or performed  during the hospital encounter of 05/10/18 (from the past 48 hour(s))  Basic metabolic panel     Status: Abnormal   Collection Time: 05/11/18  5:28 AM  Result Value Ref Range   Sodium 136 135 - 145 mmol/L   Potassium 3.8 3.5 - 5.1 mmol/L   Chloride 103 101 - 111 mmol/L   CO2 23 22 - 32 mmol/L   Glucose, Bld 178 (H) 65 -  99 mg/dL   BUN 7 6 - 20 mg/dL   Creatinine, Ser 1.02 0.61 - 1.24 mg/dL   Calcium 8.0 (L) 8.9 - 10.3 mg/dL   GFR calc non Af Amer >60 >60 mL/min   GFR calc Af Amer >60 >60 mL/min    Comment: (NOTE) The eGFR has been calculated using the CKD EPI equation. This calculation has not been validated in all clinical situations. eGFR's persistently <60 mL/min signify possible Chronic Kidney Disease.    Anion gap 10 5 - 15    Comment: Performed at [facility], [place] [address], [place], [place] [address]  CBC     Status: Abnormal   Collection Time: 05/11/18  5:28 AM  Result Value Ref Range   WBC 16.3 (H) 4.0 - 10.5 K/uL   RBC 4.60 4.22 - 5.81 MIL/uL   Hemoglobin 13.8 13.0 - 17.0 g/dL   HCT 40.3 39.0 - 52.0 %   MCV 87.6 78.0 - 100.0 fL   MCH 30.0 26.0 - 34.0 pg   MCHC 34.2 30.0 - 36.0 g/dL   RDW 14.1 11.5 - 15.5 %   Platelets 189 150 - 400 K/uL    Comment: Performed at [facility], [place] [address] [place], [place] [address]  Magnesium     Status: Abnormal   Collection Time: 05/11/18  5:28 AM  Result Value Ref Range   Magnesium 1.5 (L) 1.7 - 2.4 mg/dL    Comment: Performed at [facility], [place] [address], [place], [place] [address]    Imaging / Studies: No results found.  Medications / Allergies: per chart  Antibiotics: Anti-infectives (From admission, onward)   Start     Dose/Rate Route Frequency Ordered Stop   05/11/18 0000  cefoTEtan (CEFOTAN) 2 g in sodium chloride 0.9 % 100 mL IVPB     2 g 200 mL/hr over 30 Minutes Intravenous Every 12 hours 05/10/18 1726 05/11/18 0008   05/10/18 1520  clindamycin (CLEOCIN) 900 mg, gentamicin (GARAMYCIN) 240 mg in sodium chloride 0.9 % 1,000 mL for intraperitoneal lavage  Status:  Discontinued       As needed 05/10/18 1526 05/10/18 1612   05/10/18 1200  clindamycin (CLEOCIN) 900 mg, gentamicin (GARAMYCIN) 240 mg in sodium chloride 0.9 % 1,000 mL for intraperitoneal lavage  Status:   Discontinued    Note to Pharmacy:  Have in the  [place] room for final irrigation in bowel surgery case to minimize risk of abscess/infection Pharmacy may adjust dosing strength, schedule, rate of infusion, etc as needed to optimize therapy    Intraperitoneal On call to O.R. 05/10/18 1158 05/10/18 1707   05/10/18 1015  cefoTEtan (CEFOTAN) 2 g in sodium chloride 0.9 % 100 mL IVPB  Status:  Discontinued     2 g 200 mL/hr over 30 Minutes Intravenous On call to O.R. 05/10/18 1003 05/10/18 1707   05/10/18 0900  cefoTEtan in Dextrose 5% (CEFOTAN) IVPB 2 g  Status:  Discontinued     2 g Intravenous On call to O.R. 05/10/18 6967 05/10/18  1005        Note: Portions of this report may have been transcribed using voice recognition software. Every effort was made to ensure accuracy; however, inadvertent computerized transcription errors may be present.   Any transcriptional errors that result from this process are unintentional.    Sharon Mt. Dema Severin, M.D. General and [facility]  05/12/2018

## 2018-05-13 LAB — CBC
HEMATOCRIT: 38.4 % — AB (ref 39.0–52.0)
Hemoglobin: 13 g/dL (ref 13.0–17.0)
MCH: 30.2 pg (ref 26.0–34.0)
MCHC: 33.9 g/dL (ref 30.0–36.0)
MCV: 89.1 fL (ref 78.0–100.0)
Platelets: 157 10*3/uL (ref 150–400)
RBC: 4.31 MIL/uL (ref 4.22–5.81)
RDW: 14.2 % (ref 11.5–15.5)
WBC: 10.4 10*3/uL (ref 4.0–10.5)

## 2018-05-13 LAB — BASIC METABOLIC PANEL
Anion gap: 8 (ref 5–15)
BUN: 6 mg/dL (ref 6–20)
CHLORIDE: 104 mmol/L (ref 101–111)
CO2: 23 mmol/L (ref 22–32)
Calcium: 7.8 mg/dL — ABNORMAL LOW (ref 8.9–10.3)
Creatinine, Ser: 0.74 mg/dL (ref 0.61–1.24)
GFR calc non Af Amer: >60 mL/min (ref >60)
Glucose, Bld: 97 mg/dL (ref 65–99)
POTASSIUM: 3.5 mmol/L (ref 3.5–5.1)
Sodium: 135 mmol/L (ref 135–145)

## 2018-05-13 NOTE — Progress Notes (Signed)
Pharmacy Brief Note - Alvimopan (Entereg)  The standing order set for alvimopan (Entereg) now includes an automatic order to discontinue the drug after the patient has had a bowel movement. The change was approved by the [facility] and the Medical Executive Committee.  This patient has had a bowel movement documented by nursing. Therefore, alvimopan has been discontinued. If there are questions, please contact the pharmacy at [phone].  Thank you   Reuel Boom, PharmD, BCPS 05/13/2018, 11:38 AM

## 2018-05-13 NOTE — Progress Notes (Signed)
[facility] Office:  [phone] General Surgery Progress Note   LOS: 3 days  POD -  3 Days Post-Op  Chief Complaint: Incontinence  Assessment and Plan: 1. XI ROBOTIC RESECTION OF ILEAL J POUCH, LYSIS OF ADHESIONS, WITH CREATION OF PERMANENT END ILEOSTOMY, LYSIS OF ADHESION, RECTAL EXAM UNDER ANESTHESIA - 05/10/2018 - Gross   Looks good. Tolerating diet.  Ostomy functioning.  Probably home in AM  2.  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  Doing well.  Wife and son in room.  Objective:   Vitals:   05/12/18 2141 05/13/18 0517  BP: 120/89 122/83  Pulse: 96 89  Resp: 18 18  Temp: 98.6 F (37 C) 98.7 F (37.1 C)  SpO2: 96% 98%     Intake/Output from previous day:  05/24 0701 - 05/25 0700 In: 1060 [P.O.:1050] Out: 626 [Drains:75; Stool:551]  Intake/Output this shift:  No intake/output data recorded.   Physical Exam:   General: Thin male who is alert and oriented.    HEENT: Normal. Pupils equal. .   Lungs: Clear.  IS=1,500cc.  Has rattley cough, but he says this is chronic.   Abdomen: Soft   Wound: Ostomy functioning.  Drain in RLQ - 75 cc recorded.   Lab Results:    Recent Labs    05/11/18 0528 05/13/18 0400  WBC 16.3* 10.4  HGB 13.8 13.0  HCT 40.3 38.4*  PLT 189 157    BMET   Recent Labs    05/11/18 0528 05/13/18 0400  NA 136 135  K 3.8 3.5  CL 103 104  CO2 23 23  GLUCOSE 178* 97  BUN 7 6  CREATININE 1.02 0.74  CALCIUM  8.0* 7.8*    PT/INR  No results for input(s): LABPROT, INR in the last 72 hours.  ABG  No results for input(s): PHART, HCO3 in the last 72 hours.  Invalid input(s): PCO2, PO2   Studies/Results:  No results found.   Anti-infectives:   Anti-infectives (From admission, onward)   Start     Dose/Rate Route Frequency Ordered Stop   05/11/18 0000  cefoTEtan (CEFOTAN) 2 g in sodium chloride 0.9 % 100 mL IVPB     2 g 200 mL/hr over 30 Minutes Intravenous Every 12 hours 05/10/18 1726 05/11/18 0008   05/10/18 1520  clindamycin (CLEOCIN) 900 mg, gentamicin (GARAMYCIN) 240 mg in sodium chloride 0.9 % 1,000 mL for intraperitoneal lavage  Status:  Discontinued       As needed 05/10/18 1526 05/10/18 1612   05/10/18 1200  clindamycin (CLEOCIN) 900 mg, gentamicin (GARAMYCIN) 240 mg in sodium chloride 0.9 % 1,000 mL for intraperitoneal lavage  Status:  Discontinued    Note to Pharmacy:  Have in the  [place] room for final irrigation in bowel surgery case to minimize risk of abscess/infection Pharmacy may adjust dosing strength, schedule, rate of infusion, etc as needed to optimize therapy    Intraperitoneal On call to O.R. 05/10/18 1158 05/10/18 1707   05/10/18 1015  cefoTEtan (CEFOTAN)  2 g in sodium chloride 0.9 % 100 mL IVPB  Status:  Discontinued     2 g 200 mL/hr over 30 Minutes Intravenous On call to O.R. 05/10/18 1003 05/10/18 1707   05/10/18 0900  cefoTEtan in Dextrose 5% (CEFOTAN) IVPB 2 g  Status:  Discontinued     2 g Intravenous On call to O.R. 05/10/18 0859 05/10/18 1005      Alphonsa Overall, MD, FACS Pager: [facility] Office: [phone] 05/13/2018

## 2018-05-14 NOTE — Progress Notes (Signed)
Pt alert, oriented, tolerating diet.  Pt took ostomy supplies home with him.  JP drain was removed. D/C instructions were given, all questions answered.  Pt was d/cd home with spouse.

## 2018-05-14 NOTE — Discharge Summary (Signed)
  Patient ID: [name] [id number] 47 y.o. Feb 20, 1971  05/10/2018  Discharge date and time: 05/14/2018   Admitting Physician: Edward Jolly  Discharge Physician: Edward Jolly  Admission Diagnoses: Nathalie  Discharge Diagnoses: Same  Operations: Procedure(s): XI ROBOTIC RESECTION OF ILEAL J POUCH, LYSIS OF ADHESIONS, WITH CREATION OF PERMANENT END ILEOSTOMY. LYSIS OF ADHESION RECTAL EXAM UNDER ANESTHESIA  Admission Condition: fair  Discharged Condition: good  Indication for Admission: Patient with history of colectomy and ileoanal J-pouch but persistent diarrhea and difficulty with the pouch that could not be controlled medically.  Electively admitted for resection of his J-pouch and creation of permanent ileostomy.  Hospital Course: On the morning of admission the patient underwent the above procedure.  His postoperative course was uncomplicated.  He was begun on a liquid diet which he tolerated well and was able to be advanced to a regular diet without difficulty.  Ileostomy functioned well.  At the time of discharge he has no complaints.  Abdomen is soft and nontender.  Wounds clean.   Disposition: Home  Patient Instructions:  Allergies as of 05/14/2018   No Known Allergies     Medication List    TAKE these medications   ALPRAZolam 0.5 MG tablet Commonly known as:  XANAX Take 1 tablet (0.5 mg total) by mouth 2 (two) times daily as needed for anxiety or sleep.   [employer] ZINC OXIDE 20 % ointment Generic drug:  zinc oxide Apply 1 application topically daily as needed. For skin irritation.   gabapentin 300 MG capsule Commonly known as:  NEURONTIN Take 1 capsule (300 mg total) by mouth 2 (two) times daily.   oxyCODONE 5 MG immediate release tablet Commonly known as:  Oxy IR/ROXICODONE Take 1-2 tablets (5-10 mg total) by mouth every 6 (six) hours as needed for moderate pain, severe pain or breakthrough  pain.   paregoric 2 MG/5ML solution Take 5-10 mLs by mouth 4 (four) times daily as needed for diarrhea or loose stools.   sertraline 50 MG tablet Commonly known as:  ZOLOFT Take 1 tablet (50 mg total) by mouth daily.       Activity: no heavy lifting for 3 weeks Diet: regular diet Wound Care: none needed  Follow-up:  With Dr. gross in 2 weeks.  Signed: Edward Jolly MD, FACS  05/14/2018, 9:32 AM

## 2018-05-20 IMAGING — CT CT ABD-PELV W/ CM
2 of 5 series · 14 of 46 positions shown, 16 images · IV contrast (Omni 300)
Comparison: Chest radiographs 02/15/2007.

CLINICAL DATA: 45-year-old male with intermittent diffuse abdominal
pain and low back pain for 1 month. Initial encounter.

EXAM:
CT ABDOMEN AND PELVIS WITH CONTRAST
TECHNIQUE: Multidetector CT imaging of the abdomen and pelvis was performed
using the standard protocol following bolus administration of
intravenous contrast.
CONTRAST:  100mL DL023I-OEE IOPAMIDOL (DL023I-OEE) INJECTION 61%

[Series 2: a/p w/ 5mm · axial · 0.69mm/px · z∈[-1018,-593]mm · 11 of 97 slices shown, 13 images]
[im 6/97  soft-tissue]
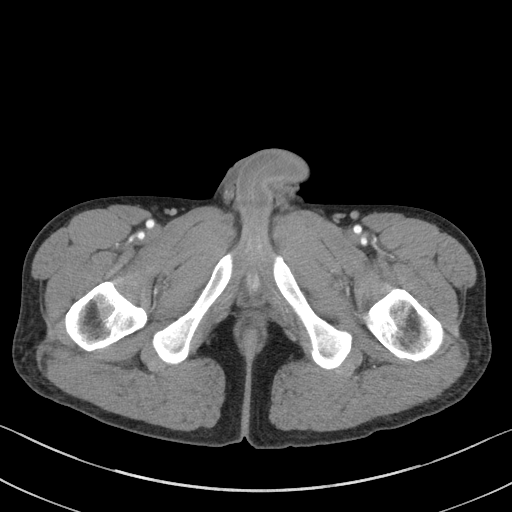
[im 6/97  bone]
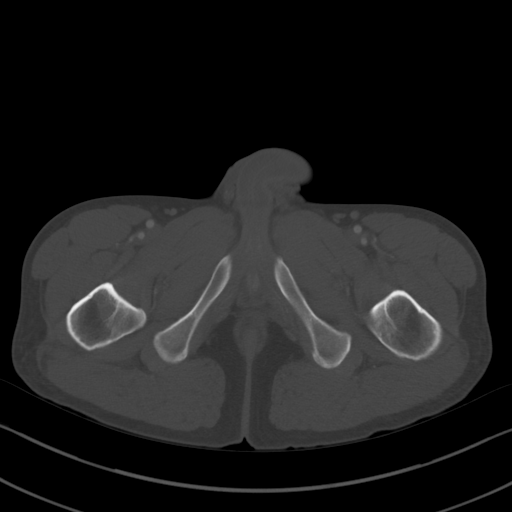
[im 17/97  soft-tissue]
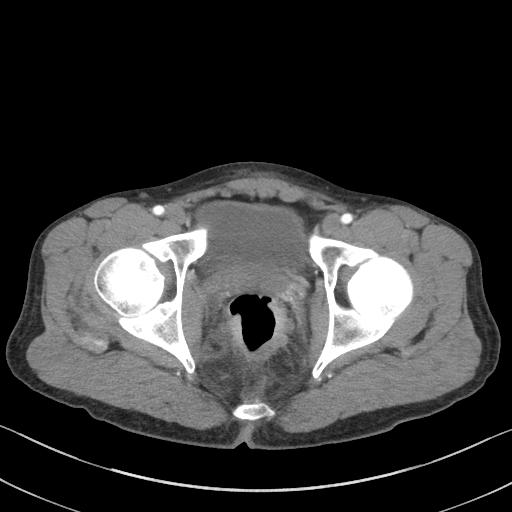
[im 22/97  soft-tissue]
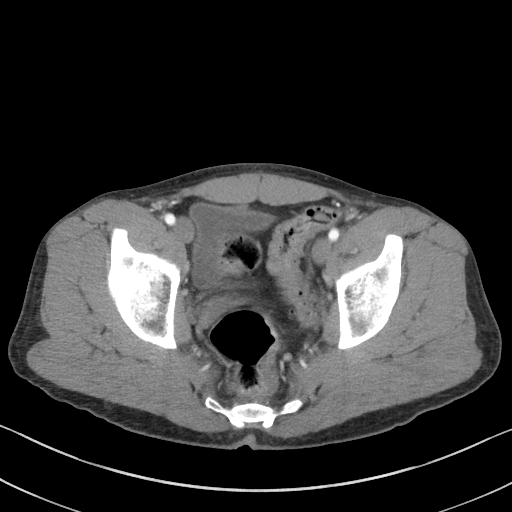
[im 33/97  soft-tissue]
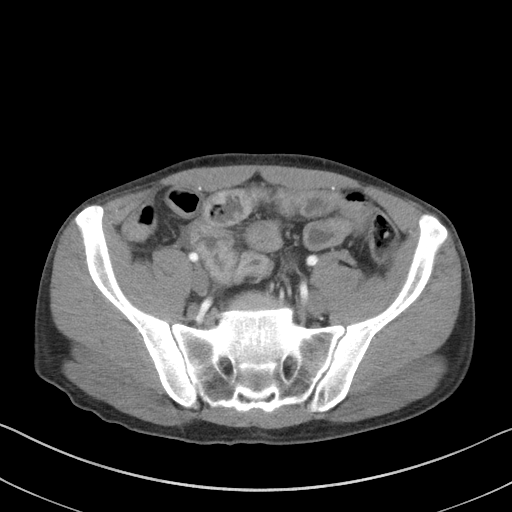
[im 38/97  soft-tissue]
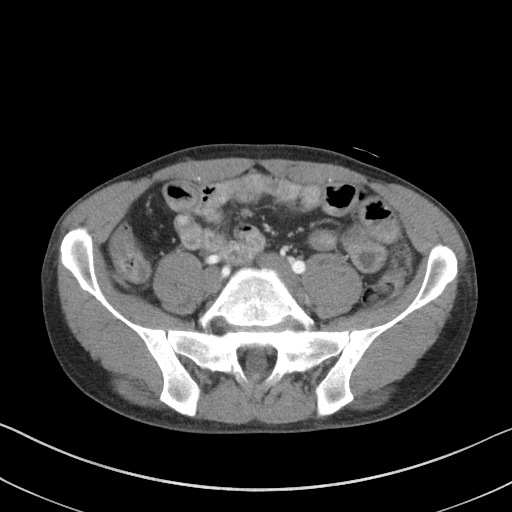
[im 49/97  soft-tissue]
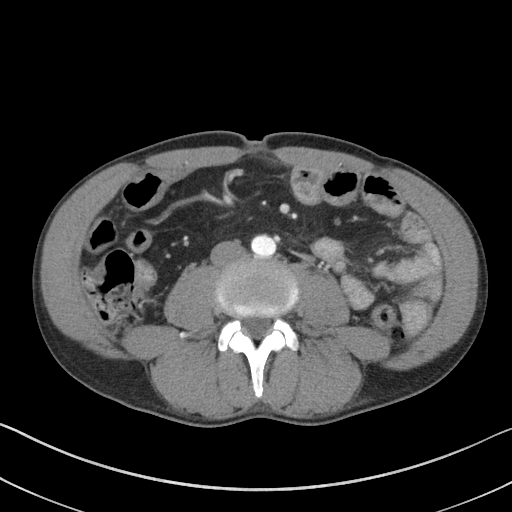
[im 59/97  soft-tissue]
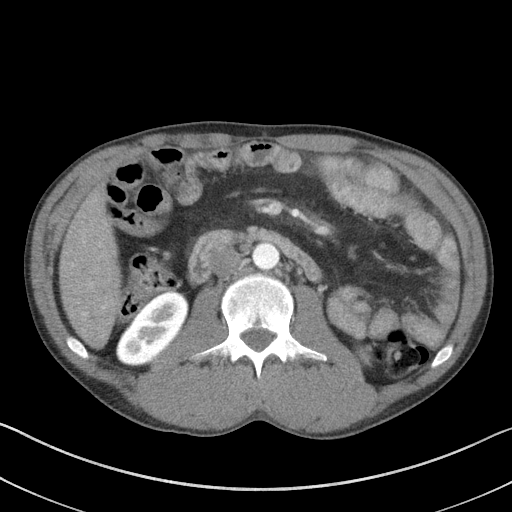
[im 65/97  soft-tissue]
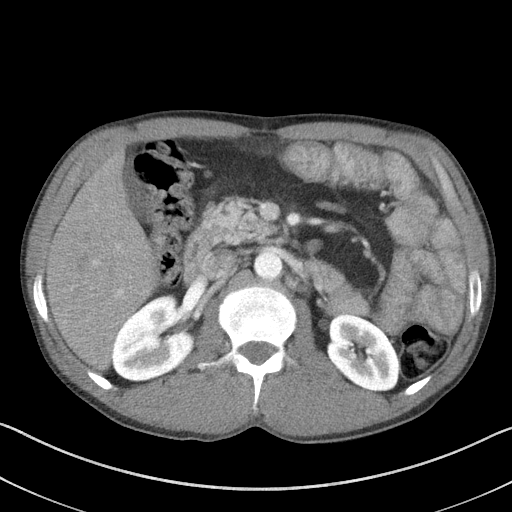
[im 75/97  soft-tissue]
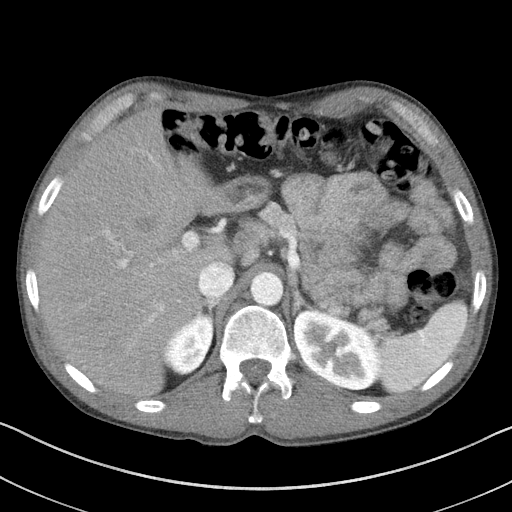
[im 75/97  bone]
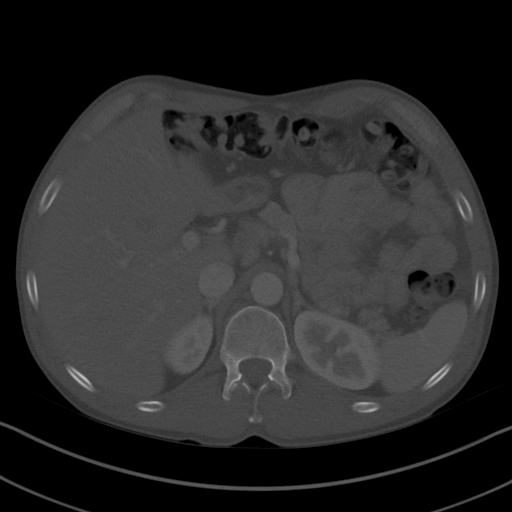
[im 81/97  soft-tissue]
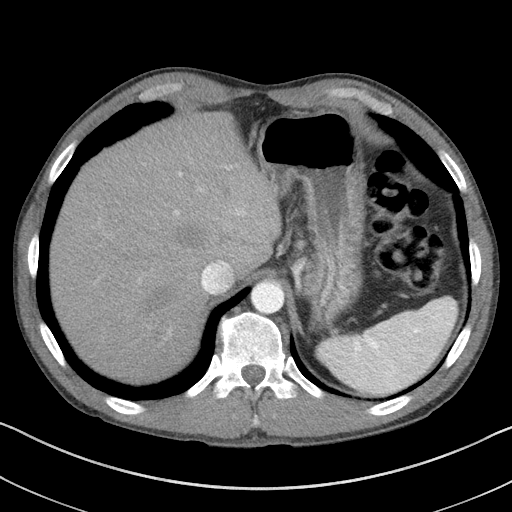
[im 91/97  soft-tissue]
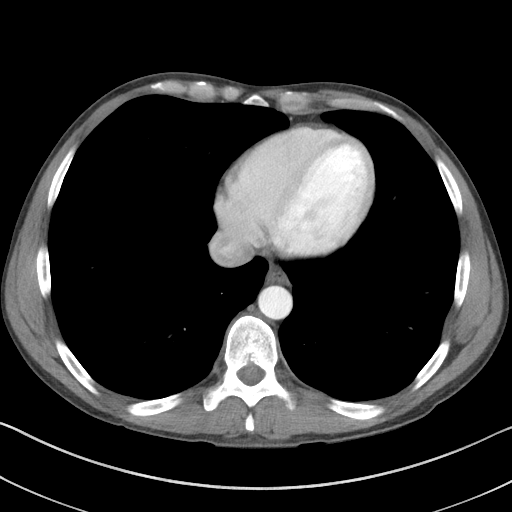

[Series 5: a/p w/ cor · coronal · 0.71mm/px · 3 of 121 slices shown]
[im 41/121  soft-tissue]
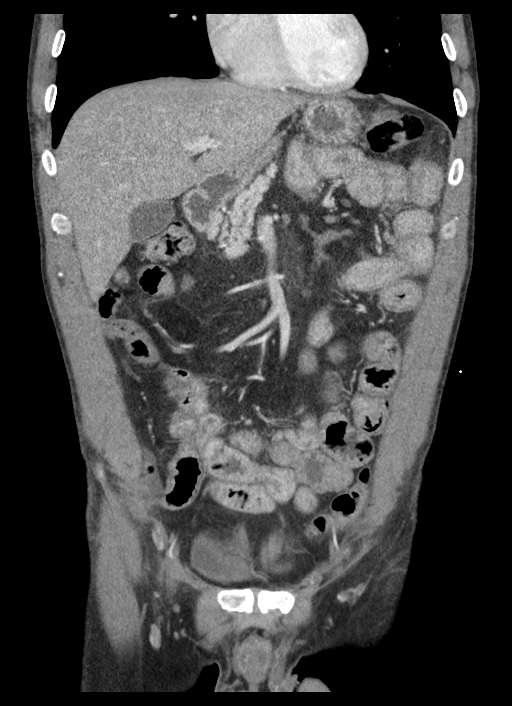
[im 54/121  soft-tissue]
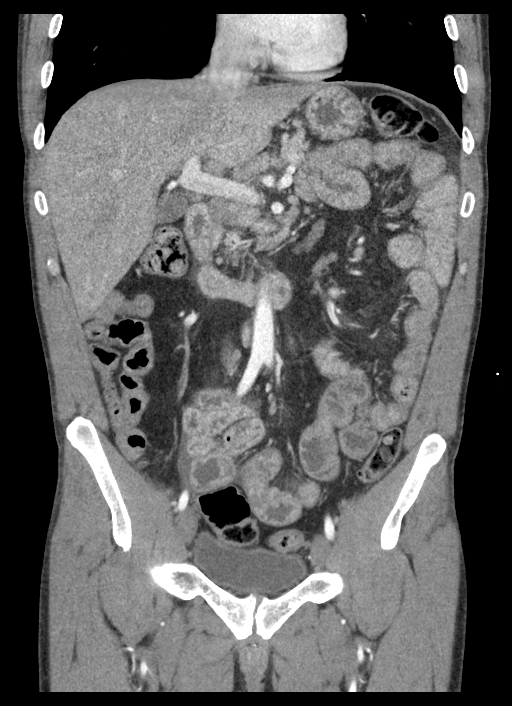
[im 67/121  soft-tissue]
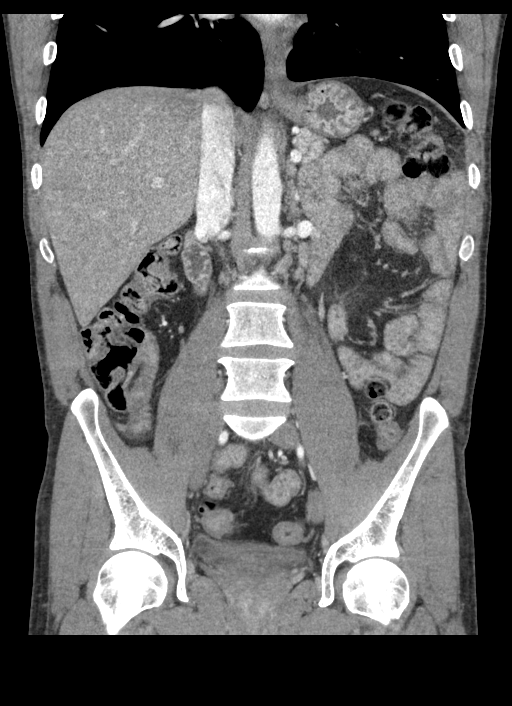

[14 of 46 positions shown; findings below may reference images not displayed]

FINDINGS: Centrilobular and paraseptal emphysema at the lung bases. Up to mild
lower lobe peribronchial thickening. 3-4 mm left lower lobe
pulmonary nodule on series 3, image 3. Similar small nodule along
the left major fissure on image 4.

No pericardial or pleural effusion.

Chronic bilateral L5 pars fractures. Trace associated grade 1
anterolisthesis at L5-S1. No associated spinal stenosis. Incidental
S1 spina bifida occulta (normal variant). No acute osseous
abnormality identified.

No pelvic free fluid. However, there is mild thickening and
irregularity of the mid to distal rectal wall as seen on series 2,
image 78. Up to 12 mm lobular areas of wall thickening suggested
with hyper enhancement. No perirectal stranding. See coronal image
84.

Proximal rectum has a more normal appearance. The sigmoid colon is
redundant with moderate diverticulosis, but no definite active
inflammation. Suggestion of wall thickening at the proximal sigmoid
and distal descending colon may be artifact due to under distension.

The proximal descending colon has a more normal appearance. There
are occasional diverticula there are and at the splenic flexure with
no active inflammation identified. Negative transverse colon, right
colon and appendix. Negative terminal ileum. No dilated small bowel.
Questionable mild small bowel mural hyper enhancement, with similar
findings at the stomach and duodenum which otherwise appear
negative.

No abdominal free fluid or free air. Liver, gallbladder, pancreas,
adrenal glands, and kidneys appear normal. Negative urinary bladder.

The spleen is normal aside from a circumscribed cyst with simple
fluid density at the superior pole measuring 12 mm. This abuts the
capsule and appears benign (series 2, image 11).

Portal venous system is patent. Major arterial structures appear
normal. Mildly increased in number lymph nodes throughout the
abdomen and pelvis, however most remain normal by size criteria. Of
note, there are rounded perirectal lymph nodes up to 8-9 mm (coronal
image 106). No pelvic sidewall lymphadenopathy.
IMPRESSION: 1. Nodular or thickening and hyper enhancement of the rectal wall
with maximal to mildly enlarged and rounded perirectal lymph nodes.
This could be inflammatory (proctitis), but the appearance is
suspicious for Rectal Carcinoma. GI consultation and Endoscopy
recommended.
2. Diverticulosis of the left and sigmoid colon without active
inflammation, but questionable sigmoid wall thickening. Questionable
small bowel and stomach mural hyper-enhancement which raises the
possibility of an infectious or inflammatory gastroenteritis. The
terminal ileum appears spared.
3. Nonspecific low level lymphadenopathy elsewhere in the abdomen
and pelvis.
4. Lower lobe emphysema and to small 3-4 mm left lower lobe lung
nodules. No follow-up needed if patient is low-risk (and has no
known or suspected primary neoplasm). Non-contrast chest CT can be
considered in 12 months if patient is high-risk. This recommendation
follows the consensus statement: Guidelines for Management of
Incidental Pulmonary Nodules Detected on CT Images:From the
[HOSPITAL] 4942; published online before print
(10.1148/radiol.4454545476).
5. Chronic L5 pars fractures with mild grade 1 spondylolisthesis.

## 2018-05-24 ENCOUNTER — Other Ambulatory Visit: Payer: Self-pay

## 2018-05-24 ENCOUNTER — Ambulatory Visit (INDEPENDENT_AMBULATORY_CARE_PROVIDER_SITE_OTHER): Payer: Medicaid Other | Admitting: Family Medicine

## 2018-05-24 ENCOUNTER — Encounter: Payer: Self-pay | Admitting: Family Medicine

## 2018-05-24 VITALS — BP 108/80 | HR 65 | Temp 98.2°F | Ht 72.0 in | Wt 140.8 lb

## 2018-05-24 DIAGNOSIS — C20 Malignant neoplasm of rectum: Secondary | ICD-10-CM

## 2018-05-24 DIAGNOSIS — M792 Neuralgia and neuritis, unspecified: Secondary | ICD-10-CM | POA: Diagnosis not present

## 2018-05-24 DIAGNOSIS — F320 Major depressive disorder, single episode, mild: Secondary | ICD-10-CM | POA: Diagnosis not present

## 2018-05-24 DIAGNOSIS — F411 Generalized anxiety disorder: Secondary | ICD-10-CM

## 2018-05-24 MED ORDER — HYDROXYZINE HCL 10 MG PO TABS: 10.0000 mg | ORAL_TABLET | Freq: Three times a day (TID) | ORAL | 2 refills | Status: DC | PRN

## 2018-05-24 MED ORDER — IBUPROFEN 600 MG PO TABS: 600.0000 mg | ORAL_TABLET | Freq: Three times a day (TID) | ORAL | 0 refills | Status: AC | PRN

## 2018-05-24 MED ORDER — SERTRALINE HCL 50 MG PO TABS: 100.0000 mg | ORAL_TABLET | Freq: Every day | ORAL | 3 refills | Status: DC

## 2018-05-24 MED ORDER — GABAPENTIN 300 MG PO CAPS: 300.0000 mg | ORAL_CAPSULE | Freq: Three times a day (TID) | ORAL | 3 refills | Status: DC

## 2018-05-24 NOTE — Progress Notes (Signed)
Subjective: Chief Complaint  Patient presents with  . Depression and Anxiety     HPI: [name] is a 47 y.o. presenting to clinic today to discuss the following:  Anxiety Patient states he has had his permanent colostomy and his diarrhea has improved. At first, he states that this helped him a great deal and his anxiety was relieved. However, several weeks ago he began to have panic attacks again where he describes the episodes like "everything is closing in on me". Patient is clearly worried and concerned and tearful during our time today. GAD-7 today is 21 and answered "very difficult" to last question.  Depression Patient states he is still struggling with depression. On most days he states he feels unmotivated to leave. The surgery made it easier for him to get out as it has resolved his diarrhea but he has difficulty sleeping, feels hopeless, has a lack of energy, is eating less, and no longer engages in hobbies. He states he is "just having a hard time dealing with everything that has happened to me". He states it feels like everything happened at once and as he was going through treatment for his cancer it was easier b/c he needed to be strong. But now his treatment is over he is having difficulty dealing with the uncertainty of his future. PHQ-9 is 24 and denies having any thoughts or feelings of hurting himself or someone else. He denies having a gun in the home. He states the Sertraline has helped him some.  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 He has been getting gabapentin from his oncologist but now that he will not see her as often he has informed me his oncologist wants his primary doctor to handle his daily medications. He is having episodes of neuropathic pain in his feet that is contributing to his anxiety.  Health Maintenance: none     ROS noted in HPI.   Past Medical, Surgical, Social, and Family  History Reviewed & Updated per EMR.   Pertinent Historical Findings include:   Social History   Tobacco Use  Smoking Status Current Every Day Smoker  . Packs/day: 1.00  . Years: 32.00  . Pack years: 32.00  . Types: Cigarettes  Smokeless Tobacco Never Used    Objective: BP 108/80   Pulse 65   Temp 98.2 F (36.8 C) (Oral)   Ht 6' (1.829 m)   Wt 140 lb 12.8 oz (63.9 kg)   SpO2 99%   BMI 19.10 kg/m  Vitals and nursing notes reviewed  Physical Exam  Gen: A&O x 3; mild distress Resp: CTAB, NWOB, no crackles, wheezes, or rhonchi Cardio: RRR, normal S1, S2, no murmurs Psych: tearful, fidgeting, and restless. Clearly worried with rapid speech and tearful expression. Denies any intent to harm himself or others. Appropriate behavior toward others.  No results found for this or any previous visit (from the past 72 hour(s)).  Assessment/Plan:  Current mild episode of major depressive disorder without prior episode ([place]) Uncontrolled depression with PHQ-9 of 24. Increasing Sertraline to 100mg  qhs. Also encouraged patient to schedule appointment with [facility]. Patient agreed to return tomorrow.  May consider adding another medication if no improvement in 2 weeks.  Anxiety state Increased Sertraline from 50mg  to 100mg  daily. Patient still having episodes of panic attacks and poorly controlled anxiety. GAD-7 score of 21 with predominate feelings of anxiousness.   Patient states Xanax helped but "not really". I will discontinue  for now as it can be a habit forming drug and it seems patient was not getting a great benefit when taking it.  Started Hydroxyzine for episodes of panic attacks. Educated patient not to drive or operate anything after taking.  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  Increasing Gabapentin from 100mg  TID to 300mg  TID     PATIENT EDUCATION PROVIDED: See AVS    Diagnosis and plan along with any newly prescribed  medication(s) were discussed in detail with this patient today. The patient verbalized understanding and agreed with the plan. Patient advised if symptoms worsen return to clinic or ER.   Health Maintainance:   No orders of the defined types were placed in this encounter.   Meds ordered this encounter  Medications  . gabapentin (NEURONTIN) 300 MG capsule    Sig: Take 1 capsule (300 mg total) by mouth 3 (three) times daily.    Dispense:  90 capsule    Refill:  3  . sertraline (ZOLOFT) 50 MG tablet    Sig: Take 2 tablets (100 mg total) by mouth daily.    Dispense:  30 tablet    Refill:  3  . hydrOXYzine (ATARAX/VISTARIL) 10 MG tablet    Sig: Take 1 tablet (10 mg total) by mouth every 8 (eight) hours as needed for anxiety.    Dispense:  30 tablet    Refill:  2  . ibuprofen (ADVIL,MOTRIN) 600 MG tablet    Sig: Take 1 tablet (600 mg total) by mouth every 8 (eight) hours as needed.    Dispense:  30 tablet    Refill:  0     Harolyn Rutherford, DO 05/24/2018, 2:47 PM PGY-1, [place]

## 2018-05-24 NOTE — Patient Instructions (Signed)
It was great to meet you today! Thank you for letting me participate in your care!  Today, we discussed your increased depression and anxiety. I am increasing sertraline to 100mg  per day. Please take it every day. I am also starting you on 10mg  of hydroxyzine to take as needed when you feel like very anxious. It will also help you get some sleep.  I increased your gabapentin to 300mg  three times per day.  Please return tomorrow to meet with integrated care to help develop methods of dealing with your stress and anxiety.  Please follow up with me in two weeks.  Your rash is likely shingles but you are outside the treatment period. The rash will heal on its own. You can use OTC benadryl cream for itching.  Be well, Harolyn Rutherford, DO PGY-1, [facility]

## 2018-05-25 ENCOUNTER — Ambulatory Visit: Payer: Medicaid Other

## 2018-05-28 DIAGNOSIS — M792 Neuralgia and neuritis, unspecified: Secondary | ICD-10-CM | POA: Insufficient documentation

## 2018-05-28 NOTE — Assessment & Plan Note (Signed)
Increased Sertraline from 50mg  to 100mg  daily. Patient still having episodes of panic attacks and poorly controlled anxiety. GAD-7 score of 21 with predominate feelings of anxiousness.   Patient states Xanax helped but "not really". I will discontinue for now as it can be a habit forming drug and it seems patient was not getting a great benefit when taking it.  Started Hydroxyzine for episodes of panic attacks. Educated patient not to drive or operate anything after taking.

## 2018-05-28 NOTE — Assessment & Plan Note (Signed)
Uncontrolled depression with PHQ-9 of 24. Increasing Sertraline to 100mg  qhs. Also encouraged patient to schedule appointment with [facility]. Patient agreed to return tomorrow.  May consider adding another medication if no improvement in 2 weeks.

## 2018-05-28 NOTE — Assessment & Plan Note (Signed)
Neuropathic foot pain bilaterally from chemotherapy.  Increasing Gabapentin from 100mg  TID to 300mg  TID

## 2018-05-30 NOTE — Progress Notes (Signed)
Disability paperwork completed and taken to Ms. Wilma at front desk for patient to pick up.

## 2018-06-05 ENCOUNTER — Ambulatory Visit: Payer: Medicaid Other | Admitting: Family Medicine

## 2018-06-15 ENCOUNTER — Ambulatory Visit (HOSPITAL_COMMUNITY)
Admission: RE | Admit: 2018-06-15 | Discharge: 2018-06-15 | Disposition: A | Discharge status: Home / Self Care | Payer: Medicaid Other | Source: Ambulatory Visit | Attending: Hematology | Admitting: Hematology

## 2018-06-15 DIAGNOSIS — C20 Malignant neoplasm of rectum: Secondary | ICD-10-CM | POA: Insufficient documentation

## 2018-06-15 DIAGNOSIS — R918 Other nonspecific abnormal finding of lung field: Secondary | ICD-10-CM | POA: Insufficient documentation

## 2018-06-15 DIAGNOSIS — I719 Aortic aneurysm of unspecified site, without rupture: Secondary | ICD-10-CM | POA: Diagnosis not present

## 2018-06-15 DIAGNOSIS — R590 Localized enlarged lymph nodes: Secondary | ICD-10-CM | POA: Insufficient documentation

## 2018-06-16 ENCOUNTER — Telehealth: Payer: Self-pay | Admitting: Hematology

## 2018-06-16 NOTE — Telephone Encounter (Signed)
I have called Pt's wife regarding his restaging CT chest from yesterday.  He has borderline enlarged mediastinal nodes, not changed since 11/2017, a few small lung nodules also unchanged, no other concern for metastasis.  She voiced good understanding and appreciated call.  Truitt Merle  06/16/2018

## 2018-06-19 ENCOUNTER — Ambulatory Visit (INDEPENDENT_AMBULATORY_CARE_PROVIDER_SITE_OTHER): Payer: Medicaid Other | Admitting: Family Medicine

## 2018-06-19 ENCOUNTER — Encounter: Payer: Self-pay | Admitting: Family Medicine

## 2018-06-19 ENCOUNTER — Other Ambulatory Visit: Payer: Self-pay

## 2018-06-19 VITALS — BP 100/72 | HR 97 | Temp 98.9°F | Ht 72.0 in | Wt 143.4 lb

## 2018-06-19 DIAGNOSIS — F411 Generalized anxiety disorder: Secondary | ICD-10-CM | POA: Diagnosis not present

## 2018-06-19 DIAGNOSIS — F320 Major depressive disorder, single episode, mild: Secondary | ICD-10-CM

## 2018-06-19 MED ORDER — SERTRALINE HCL 50 MG PO TABS: 50.0000 mg | ORAL_TABLET | Freq: Every day | ORAL | 3 refills | Status: DC

## 2018-06-19 NOTE — Progress Notes (Signed)
Subjective: Chief Complaint  Patient presents with  . Follow-up    meds    HPI: [name] is a 47 y.o. presenting to clinic today to discuss the following:  Anxiety Patient states he feels much improved. He states his anxiety has been "so much better" since his pain has decreased. Overall, he states he hasn't had any more episodes where it has felt like "the walls are closing in" or episodes of extreme restlessness. Marked improvement from last appointment. GAD-7 score of 7, which is much improved from his last visit.  Depression Improvement on Sertraline. For some reason, he did not start taking the increased dose of Sertraline and has stayed on 50mg  daily. However, he states he has felt much better on this dose and feels much improved. PHQ-9 score of 6, which is much improved from his last visit. Still experiencing some days of hopelessness, little pleasure, and sleeping difficulty with fatigue. Otherwise, he is doing well with no reported side effects from Sertraline.  Denies any auditory or visual hallucinations, inability to do ADLs, lack of motivation, lack of interest in hobbies, and is eating/sleeping better.  Health Maintenance: none today     ROS noted in HPI.   Past Medical, Surgical, Social, and Family History Reviewed & Updated per EMR.   Pertinent Historical Findings include:   Social History   Tobacco Use  Smoking Status Current Every Day Smoker  . Packs/day: 1.00  . Years: 32.00  . Pack years: 32.00  . Types: Cigarettes  Smokeless Tobacco Never Used   Objective: BP 100/72   Pulse 97   Temp 98.9 F (37.2 C) (Oral)   Ht 6' (1.829 m)   Wt 143 lb 6.4 oz (65 kg)   SpO2 99%   BMI 19.45 kg/m  Vitals and nursing notes reviewed  Physical Exam Gen: Alert and Oriented x 3, NAD, mildly fidgeting CV: RRR, no murmurs, normal S1, S2 split, +2 pulses dorsalis pedis bilaterally,  MSK: Moves all four extremities Ext: no clubbing, cyanosis, or edema Skin:  warm, dry, intact, no rashes Psych: appropriate behavior, mood, denies any suicidal ideation, demonstrates insight, and rational judgement  No results found for this or any previous visit (from the past 72 hour(s)).  Assessment/Plan:  Current mild episode of major depressive disorder without prior episode ([place]) Depression is much improved on exam today after speaking with patient. Backed by improved PHQ-9 score of 6 compared to 24 at last appointment.   Suspect improvement is due to patient compliance with Sertraline. It typically takes 4 weeks for effect to take place. No need to titrate at this time.  Anxiety state Well controlled with Sertraline 50mg . Patient for some reason that is unclear did not get 100mg  pill and did not want to take 2 pills. However, no need to titrate up since his symptoms are much improved with GAD-7 score of 7 today, down from 21 at last appointment.  Cont Sertraline and Hydroxyzine as needed.   PATIENT EDUCATION PROVIDED: See AVS    Diagnosis and plan along with any newly prescribed medication(s) were discussed in detail with this patient today. The patient verbalized understanding and agreed with the plan. Patient advised if symptoms worsen return to clinic or ER.   Health Maintainance:   No orders of the defined types were placed in this encounter.   Meds ordered this encounter  Medications  . sertraline (ZOLOFT) 50 MG tablet    Sig: Take 1 tablet (50 mg total)  by mouth daily.    Dispense:  30 tablet    Refill:  [place], DO 06/19/2018, 4:14 PM PGY-2, [place]

## 2018-06-21 ENCOUNTER — Inpatient Hospital Stay: Payer: Medicaid Other | Admitting: Hematology

## 2018-06-21 ENCOUNTER — Inpatient Hospital Stay: Payer: Medicaid Other | Attending: Hematology

## 2018-06-21 ENCOUNTER — Inpatient Hospital Stay: Payer: Medicaid Other

## 2018-06-21 NOTE — Assessment & Plan Note (Signed)
Well controlled with Sertraline 50mg . Patient for some reason that is unclear did not get 100mg  pill and did not want to take 2 pills. However, no need to titrate up since his symptoms are much improved with GAD-7 score of 7 today, down from 21 at last appointment.  Cont Sertraline and Hydroxyzine as needed.

## 2018-06-21 NOTE — Assessment & Plan Note (Signed)
Depression is much improved on exam today after speaking with patient. Backed by improved PHQ-9 score of 6 compared to 24 at last appointment.   Suspect improvement is due to patient compliance with Sertraline. It typically takes 4 weeks for effect to take place. No need to titrate at this time.

## 2018-06-23 DIAGNOSIS — Z932 Ileostomy status: Secondary | ICD-10-CM | POA: Diagnosis not present

## 2018-06-29 ENCOUNTER — Other Ambulatory Visit: Payer: Self-pay | Admitting: Family Medicine

## 2018-06-29 ENCOUNTER — Telehealth: Payer: Self-pay

## 2018-06-29 MED ORDER — FOLDING WALKING CANE MISC: 1.0000 [IU] | Freq: Every day | 0 refills | Status: DC

## 2018-06-29 NOTE — Telephone Encounter (Signed)
[name] called for patient. Requests order for a walking cane due to "feet being so bad". Will take to medical supply.  Call back is [phone]  Danley Danker, RN [facility] [place])

## 2018-06-29 NOTE — Progress Notes (Signed)
Walking cane ordered per patient request

## 2018-06-30 NOTE — Telephone Encounter (Signed)
LMOVM for pt to come pick up order from upfront. Lysbeth Dicola Kennon Holter, CMA

## 2018-06-30 NOTE — Telephone Encounter (Signed)
I figured out how to print it. I will put at front desk if you will let them know.  Danley Danker, RN [facility] RN)

## 2018-07-10 DIAGNOSIS — Z432 Encounter for attention to ileostomy: Secondary | ICD-10-CM | POA: Diagnosis not present

## 2018-07-10 DIAGNOSIS — Z932 Ileostomy status: Secondary | ICD-10-CM | POA: Diagnosis not present

## 2018-07-24 ENCOUNTER — Emergency Department (HOSPITAL_COMMUNITY)
Admission: EM | Admit: 2018-07-24 | Discharge: 2018-07-24 | Discharge status: Absent without leave | Payer: Medicaid Other

## 2018-07-24 NOTE — ED Notes (Signed)
Pts name called several times for vitals and triage no answer

## 2018-07-25 ENCOUNTER — Ambulatory Visit: Payer: Medicaid Other | Admitting: Family Medicine

## 2018-07-25 ENCOUNTER — Encounter: Payer: Self-pay | Admitting: Family Medicine

## 2018-07-25 ENCOUNTER — Other Ambulatory Visit: Payer: Self-pay

## 2018-07-25 ENCOUNTER — Inpatient Hospital Stay (HOSPITAL_COMMUNITY)
Admission: EM | Admit: 2018-07-25 | Discharge: 2018-07-28 | DRG: 862 | Disposition: A | Discharge status: Home / Self Care | Payer: Medicaid Other | Attending: Surgery | Admitting: Surgery

## 2018-07-25 ENCOUNTER — Emergency Department (HOSPITAL_COMMUNITY): Payer: Medicaid Other

## 2018-07-25 ENCOUNTER — Encounter (HOSPITAL_COMMUNITY): Payer: Self-pay

## 2018-07-25 VITALS — BP 110/60 | HR 125 | Temp 97.4°F | Wt 139.0 lb

## 2018-07-25 DIAGNOSIS — F411 Generalized anxiety disorder: Secondary | ICD-10-CM | POA: Diagnosis present

## 2018-07-25 DIAGNOSIS — Z801 Family history of malignant neoplasm of trachea, bronchus and lung: Secondary | ICD-10-CM | POA: Diagnosis not present

## 2018-07-25 DIAGNOSIS — R82998 Other abnormal findings in urine: Secondary | ICD-10-CM | POA: Diagnosis not present

## 2018-07-25 DIAGNOSIS — F419 Anxiety disorder, unspecified: Secondary | ICD-10-CM | POA: Diagnosis present

## 2018-07-25 DIAGNOSIS — F329 Major depressive disorder, single episode, unspecified: Secondary | ICD-10-CM | POA: Diagnosis present

## 2018-07-25 DIAGNOSIS — Z79891 Long term (current) use of opiate analgesic: Secondary | ICD-10-CM | POA: Diagnosis not present

## 2018-07-25 DIAGNOSIS — Z9221 Personal history of antineoplastic chemotherapy: Secondary | ICD-10-CM

## 2018-07-25 DIAGNOSIS — Z932 Ileostomy status: Secondary | ICD-10-CM | POA: Diagnosis not present

## 2018-07-25 DIAGNOSIS — Z85048 Personal history of other malignant neoplasm of rectum, rectosigmoid junction, and anus: Secondary | ICD-10-CM | POA: Diagnosis not present

## 2018-07-25 DIAGNOSIS — R159 Full incontinence of feces: Secondary | ICD-10-CM

## 2018-07-25 DIAGNOSIS — Z89422 Acquired absence of other left toe(s): Secondary | ICD-10-CM

## 2018-07-25 DIAGNOSIS — T8143XA Infection following a procedure, organ and space surgical site, initial encounter: Secondary | ICD-10-CM | POA: Diagnosis not present

## 2018-07-25 DIAGNOSIS — K651 Peritoneal abscess: Secondary | ICD-10-CM

## 2018-07-25 DIAGNOSIS — F17210 Nicotine dependence, cigarettes, uncomplicated: Secondary | ICD-10-CM | POA: Diagnosis present

## 2018-07-25 DIAGNOSIS — K529 Noninfective gastroenteritis and colitis, unspecified: Secondary | ICD-10-CM | POA: Diagnosis not present

## 2018-07-25 DIAGNOSIS — Z825 Family history of asthma and other chronic lower respiratory diseases: Secondary | ICD-10-CM | POA: Diagnosis not present

## 2018-07-25 DIAGNOSIS — Z1509 Genetic susceptibility to other malignant neoplasm: Secondary | ICD-10-CM | POA: Diagnosis present

## 2018-07-25 DIAGNOSIS — G629 Polyneuropathy, unspecified: Secondary | ICD-10-CM | POA: Diagnosis present

## 2018-07-25 DIAGNOSIS — Z833 Family history of diabetes mellitus: Secondary | ICD-10-CM

## 2018-07-25 DIAGNOSIS — N39490 Overflow incontinence: Secondary | ICD-10-CM | POA: Diagnosis present

## 2018-07-25 DIAGNOSIS — Z79899 Other long term (current) drug therapy: Secondary | ICD-10-CM | POA: Diagnosis not present

## 2018-07-25 DIAGNOSIS — Z808 Family history of malignant neoplasm of other organs or systems: Secondary | ICD-10-CM | POA: Diagnosis not present

## 2018-07-25 DIAGNOSIS — Z716 Tobacco abuse counseling: Secondary | ICD-10-CM | POA: Diagnosis not present

## 2018-07-25 DIAGNOSIS — Z800 Family history of malignant neoplasm of digestive organs: Secondary | ICD-10-CM | POA: Diagnosis not present

## 2018-07-25 DIAGNOSIS — Z720 Tobacco use: Secondary | ICD-10-CM | POA: Diagnosis present

## 2018-07-25 DIAGNOSIS — C20 Malignant neoplasm of rectum: Secondary | ICD-10-CM | POA: Diagnosis present

## 2018-07-25 DIAGNOSIS — Y838 Other surgical procedures as the cause of abnormal reaction of the patient, or of later complication, without mention of misadventure at the time of the procedure: Secondary | ICD-10-CM | POA: Diagnosis present

## 2018-07-25 DIAGNOSIS — M792 Neuralgia and neuritis, unspecified: Secondary | ICD-10-CM | POA: Diagnosis present

## 2018-07-25 DIAGNOSIS — T8149XA Infection following a procedure, other surgical site, initial encounter: Secondary | ICD-10-CM

## 2018-07-25 DIAGNOSIS — K8020 Calculus of gallbladder without cholecystitis without obstruction: Secondary | ICD-10-CM | POA: Diagnosis not present

## 2018-07-25 LAB — URINALYSIS, ROUTINE W REFLEX MICROSCOPIC
BILIRUBIN URINE: NEGATIVE
Glucose, UA: NEGATIVE mg/dL
Hgb urine dipstick: NEGATIVE
Ketones, ur: 20 mg/dL — AB
LEUKOCYTES UA: NEGATIVE
NITRITE: NEGATIVE
PH: 6 (ref 5.0–8.0)
PROTEIN: NEGATIVE mg/dL
Specific Gravity, Urine: 1.025 (ref 1.005–1.030)

## 2018-07-25 LAB — CBC
HCT: 43.3 % (ref 39.0–52.0)
HEMOGLOBIN: 14 g/dL (ref 13.0–17.0)
MCH: 27.7 pg (ref 26.0–34.0)
MCHC: 32.3 g/dL (ref 30.0–36.0)
MCV: 85.6 fL (ref 78.0–100.0)
PLATELETS: 469 10*3/uL — AB (ref 150–400)
RBC: 5.06 MIL/uL (ref 4.22–5.81)
RDW: 14.7 % (ref 11.5–15.5)
WBC: 11.7 10*3/uL — ABNORMAL HIGH (ref 4.0–10.5)

## 2018-07-25 LAB — POCT URINALYSIS DIP (MANUAL ENTRY)
Glucose, UA: NEGATIVE mg/dL
LEUKOCYTES UA: NEGATIVE
NITRITE UA: NEGATIVE
Protein Ur, POC: 100 mg/dL — AB
RBC UA: NEGATIVE
Spec Grav, UA: 1.025 (ref 1.010–1.025)
Urobilinogen, UA: 0.2 E.U./dL
pH, UA: 6 (ref 5.0–8.0)

## 2018-07-25 LAB — COMPREHENSIVE METABOLIC PANEL
ALT: 21 U/L (ref 0–44)
AST: 19 U/L (ref 15–41)
Albumin: 2.1 g/dL — ABNORMAL LOW (ref 3.5–5.0)
Alkaline Phosphatase: 169 U/L — ABNORMAL HIGH (ref 38–126)
Anion gap: 12 (ref 5–15)
BUN: 10 mg/dL (ref 6–20)
CO2: 24 mmol/L (ref 22–32)
Calcium: 8.7 mg/dL — ABNORMAL LOW (ref 8.9–10.3)
Chloride: 100 mmol/L (ref 98–111)
Creatinine, Ser: 0.9 mg/dL (ref 0.61–1.24)
GFR calc Af Amer: >60 mL/min (ref >60)
GFR calc non Af Amer: >60 mL/min (ref >60)
Glucose, Bld: 83 mg/dL (ref 70–99)
Potassium: 3.9 mmol/L (ref 3.5–5.1)
Sodium: 136 mmol/L (ref 135–145)
Total Bilirubin: 1.1 mg/dL (ref 0.3–1.2)
Total Protein: 6.2 g/dL — ABNORMAL LOW (ref 6.5–8.1)

## 2018-07-25 LAB — LIPASE, BLOOD: Lipase: 25 U/L (ref 11–51)

## 2018-07-25 LAB — CK: Total CK: 16 U/L — ABNORMAL LOW (ref 49–397)

## 2018-07-25 MED ORDER — FAMOTIDINE IN NACL 20-0.9 MG/50ML-% IV SOLN
20.0000 mg | Freq: Two times a day (BID) | INTRAVENOUS | Status: DC
  Administered 2018-07-25 – 2018-07-27 (×4): 20 mg via INTRAVENOUS
  Filled 2018-07-25 (×4): qty 50

## 2018-07-25 MED ORDER — HEPARIN SODIUM (PORCINE) 5000 UNIT/ML IJ SOLN
5000.0000 [IU] | Freq: Three times a day (TID) | INTRAMUSCULAR | Status: DC
  Administered 2018-07-25 – 2018-07-28 (×8): 5000 [IU] via SUBCUTANEOUS
  Filled 2018-07-25 (×8): qty 1

## 2018-07-25 MED ORDER — MORPHINE SULFATE (PF) 2 MG/ML IV SOLN
2.0000 mg | Freq: Once | INTRAVENOUS | Status: AC
  Administered 2018-07-25: 2 mg via INTRAVENOUS
  Filled 2018-07-25: qty 1

## 2018-07-25 MED ORDER — METOPROLOL TARTRATE 5 MG/5ML IV SOLN: 5.0000 mg | Freq: Four times a day (QID) | INTRAVENOUS | Status: DC | PRN

## 2018-07-25 MED ORDER — ONDANSETRON 4 MG PO TBDP: 4.0000 mg | ORAL_TABLET | Freq: Four times a day (QID) | ORAL | Status: DC | PRN

## 2018-07-25 MED ORDER — SODIUM CHLORIDE 0.9 % IV BOLUS
1000.0000 mL | Freq: Once | INTRAVENOUS | Status: AC
  Administered 2018-07-25: 1000 mL via INTRAVENOUS

## 2018-07-25 MED ORDER — HYDROMORPHONE HCL 1 MG/ML IJ SOLN
0.5000 mg | Freq: Once | INTRAMUSCULAR | Status: AC
  Administered 2018-07-25: 0.5 mg via INTRAVENOUS
  Filled 2018-07-25: qty 1

## 2018-07-25 MED ORDER — PROCHLORPERAZINE EDISYLATE 10 MG/2ML IJ SOLN: 5.0000 mg | Freq: Four times a day (QID) | INTRAMUSCULAR | Status: DC | PRN

## 2018-07-25 MED ORDER — HYDRALAZINE HCL 20 MG/ML IJ SOLN: 10.0000 mg | INTRAMUSCULAR | Status: DC | PRN

## 2018-07-25 MED ORDER — ALPRAZOLAM 0.5 MG PO TABS: 0.5000 mg | ORAL_TABLET | Freq: Two times a day (BID) | ORAL | Status: DC | PRN

## 2018-07-25 MED ORDER — HYDROMORPHONE HCL 1 MG/ML IJ SOLN
0.5000 mg | INTRAMUSCULAR | Status: DC | PRN
  Administered 2018-07-25 – 2018-07-26 (×7): 1 mg via INTRAVENOUS
  Filled 2018-07-25 (×8): qty 1

## 2018-07-25 MED ORDER — GABAPENTIN 300 MG PO CAPS
300.0000 mg | ORAL_CAPSULE | Freq: Three times a day (TID) | ORAL | Status: DC
  Administered 2018-07-26 – 2018-07-27 (×6): 300 mg via ORAL
  Filled 2018-07-25 (×6): qty 1

## 2018-07-25 MED ORDER — SERTRALINE HCL 50 MG PO TABS
50.0000 mg | ORAL_TABLET | Freq: Every day | ORAL | Status: DC
  Administered 2018-07-26 – 2018-07-27 (×2): 50 mg via ORAL
  Filled 2018-07-25 (×2): qty 1

## 2018-07-25 MED ORDER — OXYCODONE HCL 5 MG PO TABS
5.0000 mg | ORAL_TABLET | ORAL | Status: DC | PRN
  Administered 2018-07-27 (×2): 10 mg via ORAL
  Administered 2018-07-27: 5 mg via ORAL
  Administered 2018-07-27: 10 mg via ORAL
  Filled 2018-07-25 (×3): qty 2
  Filled 2018-07-25: qty 1

## 2018-07-25 MED ORDER — DIPHENHYDRAMINE HCL 25 MG PO CAPS
25.0000 mg | ORAL_CAPSULE | Freq: Four times a day (QID) | ORAL | Status: DC | PRN
  Administered 2018-07-26: 25 mg via ORAL
  Filled 2018-07-25: qty 1

## 2018-07-25 MED ORDER — ONDANSETRON HCL 4 MG/2ML IJ SOLN
4.0000 mg | Freq: Four times a day (QID) | INTRAMUSCULAR | Status: DC | PRN
  Administered 2018-07-25: 4 mg via INTRAVENOUS
  Filled 2018-07-25: qty 2

## 2018-07-25 MED ORDER — PIPERACILLIN-TAZOBACTAM 3.375 G IVPB 30 MIN
3.3750 g | Freq: Once | INTRAVENOUS | Status: AC
  Administered 2018-07-25: 3.375 g via INTRAVENOUS
  Filled 2018-07-25: qty 50

## 2018-07-25 MED ORDER — SODIUM CHLORIDE 0.9 % IV SOLN
INTRAVENOUS | Status: DC
  Administered 2018-07-25 (×2): via INTRAVENOUS
  Administered 2018-07-26: 50 mL/h via INTRAVENOUS
  Administered 2018-07-26: 07:00:00 via INTRAVENOUS

## 2018-07-25 MED ORDER — IBUPROFEN 200 MG PO TABS
600.0000 mg | ORAL_TABLET | Freq: Four times a day (QID) | ORAL | Status: DC | PRN
  Administered 2018-07-26 – 2018-07-28 (×3): 600 mg via ORAL
  Filled 2018-07-25 (×3): qty 3

## 2018-07-25 MED ORDER — IOHEXOL 300 MG/ML  SOLN
100.0000 mL | Freq: Once | INTRAMUSCULAR | Status: AC | PRN
  Administered 2018-07-25: 100 mL via INTRAVENOUS

## 2018-07-25 MED ORDER — METHOCARBAMOL 500 MG PO TABS
500.0000 mg | ORAL_TABLET | Freq: Four times a day (QID) | ORAL | Status: DC | PRN
  Administered 2018-07-27 – 2018-07-28 (×2): 500 mg via ORAL
  Filled 2018-07-25 (×2): qty 1

## 2018-07-25 MED ORDER — PAREGORIC 2 MG/5ML PO TINC: 5.0000 mL | Freq: Four times a day (QID) | ORAL | Status: DC | PRN

## 2018-07-25 MED ORDER — HYDROXYZINE HCL 10 MG PO TABS: 10.0000 mg | ORAL_TABLET | Freq: Three times a day (TID) | ORAL | Status: DC | PRN

## 2018-07-25 MED ORDER — PROCHLORPERAZINE MALEATE 10 MG PO TABS: 10.0000 mg | ORAL_TABLET | Freq: Four times a day (QID) | ORAL | Status: DC | PRN

## 2018-07-25 MED ORDER — DIPHENHYDRAMINE HCL 50 MG/ML IJ SOLN: 25.0000 mg | Freq: Four times a day (QID) | INTRAMUSCULAR | Status: DC | PRN

## 2018-07-25 MED ORDER — ACETAMINOPHEN 500 MG PO TABS
1000.0000 mg | ORAL_TABLET | Freq: Four times a day (QID) | ORAL | Status: DC
  Administered 2018-07-26 – 2018-07-28 (×8): 1000 mg via ORAL
  Filled 2018-07-25 (×8): qty 2

## 2018-07-25 MED ORDER — PIPERACILLIN-TAZOBACTAM 3.375 G IVPB
3.3750 g | Freq: Three times a day (TID) | INTRAVENOUS | Status: DC
  Administered 2018-07-26 – 2018-07-28 (×7): 3.375 g via INTRAVENOUS
  Filled 2018-07-25 (×7): qty 50

## 2018-07-25 NOTE — Assessment & Plan Note (Signed)
UA shows some bilirubin, no leuks or nitrites. Specific gravity consistent with dehydration. No RBCs.  At this point patient is ill appearing and unable to stay hydrated with po intake due to nausea and vomiting. Ostomy output concerning for possible infection vs GI bleed. Patient has a history of GI cancer s/p colectomy and chemo. Patient will likely need imaging and further testing in the inpatient setting. Will sent patient to ED for IVF, CBC, CMP and imaging. Patient might benefit from inpatient admission.

## 2018-07-25 NOTE — ED Notes (Signed)
[facility] arrived to transport pt to [facility] 

## 2018-07-25 NOTE — ED Notes (Signed)
ED Provider at bedside. 

## 2018-07-25 NOTE — Progress Notes (Signed)
Subjective:    Patient ID: [name], male    DOB: 1971-01-18, 47 y.o.   MRN: [id number]   CC: Abdominal pain and dark urine     HPI: Patient is 46 yo male with a complex past medical history who present today complaining of abdominal and dark-colored urine.  Patient reports symptoms started about 4 days ago.  Patient reports that symptoms have worsened in the past 2 days.  Patient has been unable to eat or drink because of nausea and vomiting.  Patient also reports burning with urination.  Patient has also noted dark ostomy output.  Color went from brownish to dark green.  Patient went to the ED yesterday (8/5) and was told he had to wait 4 hours.  Patient left and made an appointment to be seen in clinic today.  Smoking status reviewed   ROS: all other systems were reviewed and are negative other than in the HPI   Past Medical History:  Diagnosis Date  . Anxiety   . C. difficile diarrhea 03/30/2018  . Colon cancer ([place]) 12/01/2016  . Depression   . Family history of colon cancer   . 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 In families with MSH6 mutations, screening can start at age 84 years since the onset of colon cancer is later in these families.  Annual screening for endome  . Poor dental hygiene   . Rectal adenocarcinoma s/p protctocolectomy, IPAA "J" pouch 12/01/2016 08/06/2016  . 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 COLON SURGERY    . EUS N/A 07/29/2016   Procedure: LOWER ENDOSCOPIC ULTRASOUND (EUS);  Surgeon: Milus Banister, MD;  Location: [facility];  Service:  Endoscopy;  Laterality: N/A;  . ILEOSTOMY CLOSURE N/A 09/22/2017   Procedure: TAKEDOWN OF LOOP ILEOSTOMY;  Surgeon: Michael Boston, MD;  Location: [facility];  Service: General;  Laterality: N/A;  . LYSIS OF ADHESION N/A 05/10/2018   Procedure: LYSIS OF ADHESION;  Surgeon: Michael Boston, MD;  Location: [facility];  Service: General;  Laterality: N/A;  . PORTACATH PLACEMENT N/A 01/26/2017   Procedure: PLACEMENT OF PORT-A-CATH CENTRAL LINE WITH FLUOROSCOPY AND ULTRASOUND;  Surgeon: Michael Boston, MD;  Location: [place];  Service: General;  Laterality: N/A;  . POUCHOSCOPY N/A 09/21/2017   Procedure: POUCHOSCOPY;  Surgeon: Leighton Ruff, MD;  Location: [facility];  Service: Endoscopy;  Laterality: N/A;  . POUCHOSCOPY N/A 01/12/2018   Procedure: POUCHOSCOPY WITH BIOPSIES;  Surgeon: Leighton Ruff, MD;  Location: [facility];  Service: Endoscopy;  Laterality: N/A;  Local  . PROCTOSCOPY N/A 12/01/2016   Procedure: RIGID PROCTOSCOPY;  Surgeon: Michael Boston, MD;  Location: [facility];  Service: General;  Laterality: N/A;  . RECTAL EXAM UNDER ANESTHESIA N/A 05/10/2018   Procedure: RECTAL EXAM UNDER ANESTHESIA;  Surgeon: Michael Boston, MD;  Location: [facility];  Service: General;  Laterality: N/A;  . TOE AMPUTATION Left   . XI ROBOTIC ASSISTED LOWER ANTERIOR RESECTION N/A 12/01/2016   Procedure: XI ROBOTIC ASSISTED PROCTOCOLECTOMY WITH ILLEOPOUCH ANASTAMOSIS WITH DIVERTING ILLEOSTOMY;  Surgeon: Michael Boston, MD;  Location: [facility];  Service: General;  Laterality: N/A;  . XI ROBOTIC ASSISTED LOWER ANTERIOR RESECTION N/A 05/10/2018   Procedure: XI ROBOTIC RESECTION OF ILEAL J POUCH, LYSIS OF ADHESIONS, WITH CREATION OF PERMANENT END ILEOSTOMY.;  Surgeon: Michael Boston, MD;  Location: [facility];  Service: General;  Laterality: N/A;    Past medical history, surgical, family, and social history reviewed and updated in the EMR as appropriate.  Objective:  BP 110/60   Pulse (!) 125   Temp (!) 97.4 F (36.3 C)  (Oral)   Wt 139 lb (63 kg)   SpO2 98%   BMI 18.85 kg/m   Vitals and nursing note reviewed  General: NAD, pleasant, able to participate in exam Cardiac: RRR, normal heart sounds, no murmurs. 2+ radial and PT pulses bilaterally Respiratory: CTAB, normal effort, No wheezes, rales or rhonchi Abdomen: soft, nontender, nondistended, no hepatic or splenomegaly, +BS Extremities: no edema or cyanosis. WWP. Skin: warm and dry, no rashes noted Neuro: alert and oriented x4, no focal deficits Psych: Normal affect and mood   Assessment & Plan:   Dark urine UA shows some bilirubin, no leuks or nitrites. Specific gravity consistent with dehydration. No RBCs.  At this point patient is ill appearing and unable to stay hydrated with po intake due to nausea and vomiting. Ostomy output concerning for possible infection vs GI bleed. Patient has a history of GI cancer s/p colectomy and chemo. Patient will likely need imaging and further testing in the inpatient setting. Will sent patient to ED for IVF, CBC, CMP and imaging. Patient might benefit from inpatient admission.    Marjie Skiff, MD [place] PGY-3

## 2018-07-25 NOTE — ED Notes (Signed)
[facility] called for transport. 

## 2018-07-25 NOTE — ED Provider Notes (Signed)
Patient placed in Quick Look pathway, seen and evaluated   Chief Complaint: Abdominal pain  HPI:   Patient presents with left flank and left lower quadrant abdominal pain for 3 days.  He has a history of Lynch syndrome status post total proctocolectomy with colostomy.  He notes his stool output has been looser and much darker.  Also notes hematuria, dysuria, urinary urgency and frequency.  Endorses nausea and vomiting.  Has had multiple episodes of nonbloody nonbilious emesis over the past few days.  Denies fevers or chills.  Seen and evaluated by his PCP who recommended presentation to the ED for further evaluation.  ROS: Positive for flank pain, urinary urgency, frequency, dysuria, hematuria, melena, nausea, vomiting  Physical Exam:   Gen: Appears uncomfortable  Neuro: Awake and Alert  Skin: Warm    Focused Exam: Colostomy bag with moderate stool output.  Stool is dark and greenish tinged.  Diffuse tenderness to palpation of the abdomen primarily in the left lower quadrant and left upper quadrant.  There is left paralumbar muscle tenderness.  Left CVA tenderness present.   Initiation of care has begun. The patient has been counseled on the process, plan, and necessity for staying for the completion/evaluation, and the remainder of the medical screening examination    Renita Papa, PA-C 07/25/18 1607    Little, Wenda Overland, MD 07/26/18 [phone]

## 2018-07-25 NOTE — ED Notes (Signed)
Patient transported to CT 

## 2018-07-25 NOTE — H&P (Addendum)
Surgical H&P  CC: abdominal pain  HPI: Very nice 47yo man with history of Lynch syndrome and rectal cancer who underwent chemoradiation therapy and LAR with completion proctocolectomy and J pouch. Due to persistent diarrhea and J pouch incontinence refractory to antibiotics and antidiarrheals and a negative pouchitis workup, he underwent resection of the J pouch and permanent end ileostomy on May 22 by Dr. Johney Maine. His post-operative course was uncomplicated and he went home on post-op day 4. He did have some pain with urination as well as perianal drainage noted at his post-op visits attributed to a draining seroma/ anal stomp closure breakdown with fistula to low pelvic floor presacral space. Plan was to monitor for a year and if not closed may need debridement.    Starting on Thursday he has been having severe left lower abdominal pain which radiates into the groin and proximal thigh. Aggravated by eating, moving, lying on that side, and urination. Also notes nausea/ decreased PO intake. No fevers.   No Known Allergies  Past Medical History:  Diagnosis Date  . Anxiety   . C. difficile diarrhea 03/30/2018  . Colon cancer ([place]) 12/01/2016  . Depression   . Family history of colon cancer   . 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 10/13/2016   MSH6 pathogenic mutation  Lynch syndrome screening recs (from up to date)  Annual colonoscopy starting between the ages of 68 and 67 years, or 10 years prior to the earliest age of colon cancer diagnosis in the family (whichever comes first). In families with MSH6 mutations, screening can start at age 82 years since the onset of colon cancer is later in these families.  Annual screening for endome  . Poor dental hygiene   . Rectal adenocarcinoma s/p protctocolectomy, IPAA "J" pouch 12/01/2016 08/06/2016  . 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 COLON SURGERY    . EUS N/A 07/29/2016   Procedure: LOWER ENDOSCOPIC ULTRASOUND (EUS);  Surgeon: Milus Banister, MD;  Location: [facility];  Service: Endoscopy;  Laterality: N/A;  . ILEOSTOMY CLOSURE N/A 09/22/2017   Procedure: TAKEDOWN OF LOOP ILEOSTOMY;  Surgeon: Michael Boston, MD;  Location: [facility];  Service: General;  Laterality: N/A;  . LYSIS OF ADHESION N/A 05/10/2018   Procedure: LYSIS OF ADHESION;  Surgeon: Michael Boston, MD;  Location: [facility];  Service: General;  Laterality: N/A;  . PORTACATH PLACEMENT N/A 01/26/2017   Procedure: PLACEMENT OF PORT-A-CATH CENTRAL LINE WITH FLUOROSCOPY AND ULTRASOUND;  Surgeon: Michael Boston, MD;  Location: [place];  Service: General;  Laterality: N/A;  . POUCHOSCOPY N/A 09/21/2017   Procedure: POUCHOSCOPY;  Surgeon: Leighton Ruff, MD;  Location: [facility];  Service: Endoscopy;  Laterality: N/A;  . POUCHOSCOPY N/A 01/12/2018   Procedure: POUCHOSCOPY WITH BIOPSIES;  Surgeon: Leighton Ruff, MD;  Location: [facility];  Service: Endoscopy;  Laterality: N/A;  Local  . PROCTOSCOPY N/A 12/01/2016   Procedure: RIGID PROCTOSCOPY;  Surgeon: Michael Boston, MD;  Location: [facility];  Service: General;  Laterality: N/A;  . RECTAL EXAM UNDER ANESTHESIA N/A 05/10/2018   Procedure: RECTAL EXAM UNDER ANESTHESIA;  Surgeon: Michael Boston, MD;  Location: [facility];  Service: General;  Laterality: N/A;  . TOE AMPUTATION Left   . XI ROBOTIC ASSISTED LOWER ANTERIOR RESECTION N/A 12/01/2016   Procedure: XI ROBOTIC ASSISTED PROCTOCOLECTOMY WITH ILLEOPOUCH ANASTAMOSIS WITH DIVERTING ILLEOSTOMY;  Surgeon:  Michael Boston, MD;  Location: [facility];  Service: General;  Laterality: N/A;  . XI ROBOTIC ASSISTED LOWER ANTERIOR RESECTION N/A 05/10/2018   Procedure: XI ROBOTIC RESECTION OF ILEAL J POUCH, LYSIS OF ADHESIONS, WITH CREATION OF PERMANENT END ILEOSTOMY.;  Surgeon: Michael Boston, MD;  Location: [facility];  Service: General;   Laterality: N/A;    Family History  Problem Relation Age of Onset  . Diabetes Mother   . COPD Father   . Colon cancer Maternal Aunt        dx in her 86s  . Diabetes Maternal Grandmother   . Brain cancer Maternal Grandfather   . Lung cancer Paternal Grandfather   . Colon cancer Cousin        dx in her 71s  . Bone cancer Sister 8  . Pancreatic cancer Neg Hx   . Esophageal cancer Neg Hx   . Stomach cancer Neg Hx   . Liver disease Neg Hx     Social History   Socioeconomic History  . Marital status: Divorced    Spouse name: Not on file  . Number of children: 3  . Years of education: Not on file  . Highest education level: Not on file  Occupational History  . Not on file  Social Needs  . Financial resource strain: Not on file  . Food insecurity:    Worry: Not on file    Inability: Not on file  . Transportation needs:    Medical: Not on file    Non-medical: Not on file  Tobacco Use  . Smoking status: Current Every Day Smoker    Packs/day: 1.00    Years: 32.00    Pack years: 32.00    Types: Cigarettes  . Smokeless tobacco: Never Used  Substance and Sexual Activity  . Alcohol use: No  . Drug use: No    Comment: patient denies  . Sexual activity: Yes  Lifestyle  . Physical activity:    Days per week: Not on file    Minutes per session: Not on file  . Stress: Not on file  Relationships  . Social connections:    Talks on phone: Not on file    Gets together: Not on file    Attends religious service: Not on file    Active member of club or organization: Not on file    Attends meetings of clubs or organizations: Not on file    Relationship status: Not on file  Other Topics Concern  . Not on file  Social History Narrative   Lives with significant other, [name]   Have a 61 year old son   [place]    Current Facility-Administered Medications on File Prior to Encounter  Medication Dose Route Frequency Provider Last Rate  Last Dose  . sodium chloride flush (NS) 0.9 % injection 10 mL  10 mL Intravenous PRN Truitt Merle, MD   10 mL at 11/18/17 7169   Current Outpatient Medications on File Prior to Encounter  Medication Sig Dispense Refill  . ALPRAZolam (XANAX) 0.5 MG tablet Take 1 tablet (0.5 mg total) by mouth 2 (two) times daily as needed for anxiety or sleep. 15 tablet 0  . [employer] ZINC OXIDE 20 % ointment Apply 1 application topically daily as needed. For skin irritation.  0  . gabapentin (NEURONTIN) 300 MG capsule Take 1 capsule (300 mg total) by mouth 3 (three) times daily. 90 capsule 3  . hydrOXYzine (  ATARAX/VISTARIL) 10 MG tablet Take 1 tablet (10 mg total) by mouth every 8 (eight) hours as needed for anxiety. 30 tablet 2  . Misc. Devices (FOLDING WALKING CANE) MISC 1 Units by Does not apply route daily. 1 each 0  . oxyCODONE (OXY IR/ROXICODONE) 5 MG immediate release tablet Take 1-2 tablets (5-10 mg total) by mouth every 6 (six) hours as needed for moderate pain, severe pain or breakthrough pain. 30 tablet 0  . paregoric 2 MG/5ML solution Take 5-10 mLs by mouth 4 (four) times daily as needed for diarrhea or loose stools.    . sertraline (ZOLOFT) 50 MG tablet Take 1 tablet (50 mg total) by mouth daily. 30 tablet 3    Review of Systems: a complete, 10pt review of systems was completed with pertinent positives and negatives as documented in the HPI  Physical Exam: Vitals:   07/25/18 1806 07/25/18 1900  BP: 121/87 (!) 143/96  Pulse: 95 88  Resp: (!) 24 20  Temp:    SpO2: 100% 98%   Gen: A&Ox3, chronically ill appearing Head: normocephalic, atraumatic Eyes: extraocular motions intact, anicteric.  Neck: supple without mass or thyromegaly Chest: unlabored respirations, symmetrical air entry, clear bilaterally   Cardiovascular: RRR with palpable distal pulses, no pedal edema Abdomen: soft, nondistended, Tender with voluntary guarding in the left lower abdomen. RUQ ileostomy pink with thin dark green output in  bag. Well healed port sites.  No mass or organomegaly.  Extremities: warm, without edema, no deformities  Neuro: grossly intact Psych: appropriate mood and affect, normal insight  Skin: warm and dry   CBC Latest Ref Rng & Units 07/25/2018 05/13/2018 05/11/2018  WBC 4.0 - 10.5 K/uL 11.7(H) 10.4 16.3(H)  Hemoglobin 13.0 - 17.0 g/dL 14.0 13.0 13.8  Hematocrit 39.0 - 52.0 % 43.3 38.4(L) 40.3  Platelets 150 - 400 K/uL 469(H) 157 189    CMP Latest Ref Rng & Units 07/25/2018 05/13/2018 05/11/2018  Glucose 70 - 99 mg/dL 83 97 178(H)  BUN 6 - 20 mg/dL _0 Creatinine 0.61 - 1.24 mg/dL 0.90 0.74 1.02  Sodium 135 - 145 mmol/L 136 135 136  Potassium 3.5 - 5.1 mmol/L 3.9 3.5 3.8  Chloride 98 - 111 mmol/L 100 104 103  CO2 22 - 32 mmol/L _1 Calcium 8.9 - 10.3 mg/dL 8.7(L) 7.8(L) 8.0(L)  Total Protein 6.5 - 8.1 g/dL 6.2(L) - -  Total Bilirubin 0.3 - 1.2 mg/dL 1.1 - -  Alkaline Phos 38 - 126 U/L 169(H) - -  AST 15 - 41 U/L 19 - -  ALT 0 - 44 U/L 21 - -    No results found for: INR, PROTIME  Imaging: Ct Abdomen Pelvis W Contrast  Result Date: 07/25/2018 CLINICAL DATA:  Left flank and lower quadrant pain x3 days. History of rectal cancer. EXAM: CT ABDOMEN AND PELVIS WITH CONTRAST TECHNIQUE: Multidetector CT imaging of the abdomen and pelvis was performed using the standard protocol following bolus administration of intravenous contrast. CONTRAST:  134m OMNIPAQUE IOHEXOL 300 MG/ML  SOLN COMPARISON:  12/06/2017 FINDINGS: Lower chest: Normal heart size without pericardial effusion. Emphysematous disease at the lung bases. No effusion or dominant mass. No pneumothorax. Hepatobiliary: Homogeneous attenuation of the liver. Tiny layering gallstones within a physiologically distended gallbladder. No secondary signs of acute cholecystitis. No biliary dilatation. Pancreas: Normal Spleen: Normal size spleen with adjacent splenule. Adrenals/Urinary Tract: Normal bilateral adrenal glands. No enhancing renal  mass nor obstructive uropathy. The urinary bladder is decompressed which  may explain the circumferential mild mural thickening noted. Cystitis is not entirely excluded. Stomach/Bowel: The stomach is nondistended. The duodenal sweep is unremarkable. The patient is status post total proctocolectomy with right upper quadrant ostomy site noted and ileo anal anastomosis present. Within the left lower quadrant is a lobulated enhancing fluid collection with largest component measuring approximately 4.7 x 3.2 x 2.8 cm concerning for an interloop abscess, trefoil in appearance on series 3/54 with diffuse moderate inflammatory thickening of adjacent small bowel loops. Given lack of bowel signature noted within this collection, dilated enhancing loops of small bowel are believed unlikely as are pseudosacculations as would be seen in inflammatory bowel disease. Likewise, intramural abscesses are believed less likely as it appears to demonstrate positive mass effect on adjacent bowel loops causing some displacement. No bowel obstruction or free air. Vascular/Lymphatic: Patent nonaneurysmal abdominal aorta and branch vessels. No inguinal, pelvic sidewall nor mesenteric lymphadenopathy. Reproductive: Prostate is unremarkable. Other: Free air. Musculoskeletal: No aggressive osseous lesions. IMPRESSION: Findings concerning for an interloop abscess in the left lower quadrant of the abdomen, trefoil in appearance on series 3/54 with largest component measuring 4.7 x 3.2 x 2.8 cm. Surrounding small bowel inflammation is noted without obstruction or free air. Redemonstration of ileoanal anastomosis, total proctocolitis and right sided ostomy. COPD of the lung bases. Uncomplicated cholelithiasis. Electronically Signed   By: Ashley Royalty M.D.   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 Likely the fluid collections on today's CT were  already present and consist the seroma he has been slowly draining, and perhaps it is secondarily infected. He states it is bloody and malodorous but not purulent. Has increased a bit in volume.  -Admit to [facility] -Fluid resuscitation, IV antibiotics, pain and nausea control.  -IR Consulted (Dr. Kathlene Cote) for possible perc drainage; not reall a good window at this point. Will try to treat with abx for now, potential rescan with Oral contrast in a few [facility], MD [facility], [place] Pager [phone]

## 2018-07-25 NOTE — ED Provider Notes (Signed)
[facility] EMERGENCY DEPARTMENT Provider Note   CSN: [id number] Arrival date & time: 07/25/18  1520     History   Chief Complaint Chief Complaint  Patient presents with  . Dysuria    HPI [name] is a 47 y.o. 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  The patient had a revision of his colostomy that failed and back in May of this year had a ptotic ileal J-pouch resection by Dr. gross.  The patient has been doing well up until this past Thursday, 07/20/2018 when he began having left-sided flank pain.  He has had progressively worsening and now severe pain radiating into the left lower quadrant of his abdomen and into his left groin.  He did have associated urinary symptoms states that it hurts when he urinates.  He denies fever but complains of 10 out of 10 pain.  Denies any nausea or vomiting.  HPI  Past Medical History:  Diagnosis Date  . Anxiety   . C. difficile diarrhea 03/30/2018  . Colon cancer ([place]) 12/01/2016  . Depression   . Family history of colon cancer   . 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 10/13/2016   MSH6 pathogenic mutation  Lynch syndrome screening recs (from up to date)  Annual colonoscopy starting between the ages of 64 and 79 years, or 10 years prior to the earliest age of colon cancer diagnosis in the family (whichever comes first). In families with MSH6 mutations, screening can start at age 95 years since the onset of colon cancer is later in these families.  Annual screening for endome  . Poor dental hygiene   . Rectal adenocarcinoma s/p protctocolectomy, IPAA "J" pouch 12/01/2016 08/06/2016  . 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 Dark urine 07/25/2018  . Neuropathic pain of foot 05/28/2018  .  Current mild episode of major depressive disorder without prior episode ([place]) 04/24/2018  . Situational syncope 03/30/2018  . Tobacco abuse 03/29/2018  . Muscle spasm 04/11/2017  . Chronic diarrhea s/p proctocolectomy & ileoanal J pouch 02/17/2017  . 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 10/13/2016  . Family history of colon cancer   . Anxiety state 09/14/2016  . Rectal adenocarcinoma s/p protctocolectomy, IPAA "J" pouch 12/01/2016 08/06/2016    Past Surgical History:  Procedure Laterality Date  . COLON SURGERY    . EUS N/A 07/29/2016   Procedure: LOWER ENDOSCOPIC ULTRASOUND (EUS);  Surgeon: Milus Banister, MD;  Location: [facility];  Service: Endoscopy;  Laterality: N/A;  . ILEOSTOMY CLOSURE N/A 09/22/2017   Procedure: TAKEDOWN OF LOOP ILEOSTOMY;  Surgeon: Michael Boston, MD;  Location: [facility];  Service: General;  Laterality: N/A;  . LYSIS OF ADHESION N/A 05/10/2018   Procedure: LYSIS OF ADHESION;  Surgeon: Michael Boston, MD;  Location: [facility];  Service: General;  Laterality: N/A;  . PORTACATH PLACEMENT N/A 01/26/2017   Procedure: PLACEMENT OF PORT-A-CATH CENTRAL LINE WITH FLUOROSCOPY AND ULTRASOUND;  Surgeon: Michael Boston, MD;  Location: [facility];  Service: General;  Laterality: N/A;  . POUCHOSCOPY N/A 09/21/2017   Procedure: POUCHOSCOPY;  Surgeon: Leighton Ruff, MD;  Location: [facility];  Service: Endoscopy;  Laterality: N/A;  . POUCHOSCOPY N/A 01/12/2018   Procedure: POUCHOSCOPY WITH BIOPSIES;  Surgeon: Leighton Ruff, MD;  Location: [facility];  Service: Endoscopy;  Laterality: N/A;  Local  . PROCTOSCOPY N/A 12/01/2016   Procedure: RIGID PROCTOSCOPY;  Surgeon: Michael Boston, MD;  Location: [facility];  Service: General;  Laterality: N/A;  . RECTAL EXAM UNDER ANESTHESIA N/A 05/10/2018   Procedure: RECTAL EXAM UNDER ANESTHESIA;  Surgeon: Michael Boston, MD;  Location[facility];  Service: General;  Laterality: N/A;  . TOE AMPUTATION Left   . XI ROBOTIC  ASSISTED LOWER ANTERIOR RESECTION N/A 12/01/2016   Procedure: XI ROBOTIC ASSISTED PROCTOCOLECTOMY WITH ILLEOPOUCH ANASTAMOSIS WITH DIVERTING ILLEOSTOMY;  Surgeon: Michael Boston, MD;  Location[facility];  Service: General;  Laterality: N/A;  . XI ROBOTIC ASSISTED LOWER ANTERIOR RESECTION N/A 05/10/2018   Procedure: XI ROBOTIC RESECTION OF ILEAL J POUCH, LYSIS OF ADHESIONS, WITH CREATION OF PERMANENT END ILEOSTOMY.;  Surgeon: Michael Boston, MD;  Location[facility];  Service: General;  Laterality: N/A;        Home Medications    Prior to Admission medications   Medication Sig Start Date End Date Taking? Authorizing Provider  ALPRAZolam Duanne Moron) 0.5 MG tablet Take 1 tablet (0.5 mg total) by mouth 2 (two) times daily as needed for anxiety or sleep. 04/21/18   Nuala Alpha, DO  [facility] ZINC OXIDE 20 % ointment Apply 1 application topically daily as needed. For skin irritation. 04/21/18   [provider]  gabapentin (NEURONTIN) 300 MG capsule Take 1 capsule (300 mg total) by mouth 3 (three) times daily. 05/24/18   Nuala Alpha, DO  hydrOXYzine (ATARAX/VISTARIL) 10 MG tablet Take 1 tablet (10 mg total) by mouth every 8 (eight) hours as needed for anxiety. 05/24/18   Nuala Alpha, DO  Misc. Devices (FOLDING WALKING CANE) MISC 1 Units by Does not apply route daily. 06/29/18   Nuala Alpha, DO  oxyCODONE (OXY IR/ROXICODONE) 5 MG immediate release tablet Take 1-2 tablets (5-10 mg total) by mouth every 6 (six) hours as needed for moderate pain, severe pain or breakthrough pain. 05/10/18   Michael Boston, MD  paregoric 2 MG/5ML solution Take 5-10 mLs by mouth 4 (four) times daily as needed for diarrhea or loose stools.    [provider]  sertraline (ZOLOFT) 50 MG tablet Take 1 tablet (50 mg total) by mouth daily. 06/19/18   Nuala Alpha, DO    Family History Family History  Problem Relation Age of Onset  . Diabetes Mother   . COPD Father   . Colon cancer Maternal Aunt        dx in her  30s  . Diabetes Maternal Grandmother   . Brain cancer Maternal Grandfather   . Lung cancer Paternal Grandfather   . Colon cancer Cousin        dx in her 38s  . Bone cancer Sister 8  . Pancreatic cancer Neg Hx   . Esophageal cancer Neg Hx   . Stomach cancer Neg Hx   . Liver disease Neg Hx     Social History Social History   Tobacco Use  . Smoking status: Current Every Day Smoker    Packs/day: 1.00    Years: 32.00    Pack years: 32.00    Types: Cigarettes  . Smokeless tobacco: Never Used  Substance Use Topics  . Alcohol use: No  . Drug use: No    Comment: patient denies     Allergies   Patient has no known allergies.   Review of Systems Review of Systems  Ten systems reviewed and are negative for acute change, except as noted in  the HPI.   Physical Exam Updated Vital Signs BP (!) 143/96   Pulse 88   Temp 98.1 F (36.7 C) (Oral)   Resp 20   SpO2 98%   Physical Exam  Constitutional: He appears well-developed and well-nourished. He appears toxic. He appears ill. No distress.  HENT:  Head: Normocephalic and atraumatic.  Eyes: Conjunctivae are normal. No scleral icterus.  Neck: Normal range of motion. Neck supple.  Cardiovascular: Normal rate, regular rhythm and normal heart sounds.  Pulmonary/Chest: Effort normal and breath sounds normal. No respiratory distress.  Abdominal: Soft. Bowel sounds are normal. There is tenderness.  It is exquisitely tender to palpation in the left lower quadrant of the abdomen, guarding.  Musculoskeletal: He exhibits no edema.  Neurological: He is alert.  Skin: Skin is warm and dry. He is not diaphoretic.  Psychiatric: His behavior is normal.  Nursing note and vitals reviewed.    ED Treatments / Results  Labs (all labs ordered are listed, but only abnormal results are displayed) Labs Reviewed  URINALYSIS, ROUTINE W REFLEX MICROSCOPIC - Abnormal; Notable for the following components:      Result Value   Color, Urine AMBER  (*)    Ketones, ur 20 (*)    All other components within normal limits  CBC - Abnormal; Notable for the following components:   WBC 11.7 (*)    Platelets 469 (*)    All other components within normal limits  COMPREHENSIVE METABOLIC PANEL - Abnormal; Notable for the following components:   Calcium 8.7 (*)    Total Protein 6.2 (*)    Albumin 2.1 (*)    Alkaline Phosphatase 169 (*)    All other components within normal limits  LIPASE, BLOOD  CK    EKG None  Radiology Ct Abdomen Pelvis W Contrast  Result Date: 07/25/2018 CLINICAL DATA:  Left flank and lower quadrant pain x3 days. History of rectal cancer. EXAM: CT ABDOMEN AND PELVIS WITH CONTRAST TECHNIQUE: Multidetector CT imaging of the abdomen and pelvis was performed using the standard protocol following bolus administration of intravenous contrast. CONTRAST:  122m OMNIPAQUE IOHEXOL 300 MG/ML  SOLN COMPARISON:  12/06/2017 FINDINGS: Lower chest: Normal heart size without pericardial effusion. Emphysematous disease at the lung bases. No effusion or dominant mass. No pneumothorax. Hepatobiliary: Homogeneous attenuation of the liver. Tiny layering gallstones within a physiologically distended gallbladder. No secondary signs of acute cholecystitis. No biliary dilatation. Pancreas: Normal Spleen: Normal size spleen with adjacent splenule. Adrenals/Urinary Tract: Normal bilateral adrenal glands. No enhancing renal mass nor obstructive uropathy. The urinary bladder is decompressed which may explain the circumferential mild mural thickening noted. Cystitis is not entirely excluded. Stomach/Bowel: The stomach is nondistended. The duodenal sweep is unremarkable. The patient is status post total proctocolectomy with right upper quadrant ostomy site noted and ileo anal anastomosis present. Within the left lower quadrant is a lobulated enhancing fluid collection with largest component measuring approximately 4.7 x 3.2 x 2.8 cm concerning for an interloop  abscess, trefoil in appearance on series 3/54 with diffuse moderate inflammatory thickening of adjacent small bowel loops. Given lack of bowel signature noted within this collection, dilated enhancing loops of small bowel are believed unlikely as are pseudosacculations as would be seen in inflammatory bowel disease. Likewise, intramural abscesses are believed less likely as it appears to demonstrate positive mass effect on adjacent bowel loops causing some displacement. No bowel obstruction or free air. Vascular/Lymphatic: Patent nonaneurysmal abdominal aorta and branch vessels. No inguinal, pelvic sidewall nor mesenteric  lymphadenopathy. Reproductive: Prostate is unremarkable. Other: Free air. Musculoskeletal: No aggressive osseous lesions. IMPRESSION: Findings concerning for an interloop abscess in the left lower quadrant of the abdomen, trefoil in appearance on series 3/54 with largest component measuring 4.7 x 3.2 x 2.8 cm. Surrounding small bowel inflammation is noted without obstruction or free air. Redemonstration of ileoanal anastomosis, total proctocolitis and right sided ostomy. COPD of the lung bases. Uncomplicated cholelithiasis. Electronically Signed   By: Ashley Royalty M.D.   On: 07/25/2018 18:27    Procedures .Critical Care Performed by: Margarita Mail, PA-C Authorized by: Margarita Mail, PA-C   Critical care provider statement:    Critical care time (minutes):  40   Critical care was necessary to treat or prevent imminent or life-threatening deterioration of the following conditions: Intramural abscess, surgical Abdomen.   Critical care was time spent personally by me on the following activities:  Discussions with consultants, evaluation of patient's response to treatment, examination of patient, ordering and performing treatments and interventions, ordering and review of laboratory studies, ordering and review of radiographic studies, pulse oximetry, re-evaluation of patient's condition,  obtaining history from patient or surrogate and review of old charts   (including critical care time)  Medications Ordered in ED Medications  piperacillin-tazobactam (ZOSYN) IVPB 3.375 g (has no administration in time range)  sodium chloride 0.9 % bolus 1,000 mL (0 mLs Intravenous Stopped 07/25/18 1847)  iohexol (OMNIPAQUE) 300 MG/ML solution 100 mL (100 mLs Intravenous Contrast Given 07/25/18 1740)  morphine 2 MG/ML injection 2 mg (2 mg Intravenous Given 07/25/18 1847)  HYDROmorphone (DILAUDID) injection 0.5 mg (0.5 mg Intravenous Given 07/25/18 1912)     Initial Impression / Assessment and Plan / ED Course  I have reviewed the triage vital signs and the nursing notes.  Pertinent labs & imaging results that were available during my care of the patient were reviewed by me and considered in my medical decision making (see chart for details).    Patient with lobulated interloop intramural wall abscess in the left lower quadrant.  I began the patient on IV Zosyn and pain medications.  The patient was seen in the emergency department by Dr. Windle Guard and will be admitted to [facility] long hospital to be followed by Dr. gross.  He is stable throughout his ER visit.  Pain is improved after IV pain medications.   Final Clinical Impressions(s) / ED Diagnoses   Final diagnoses:  Left lower quadrant abdominal abscess [facility])    ED Discharge Orders    None       Margarita Mail, PA-C 07/25/18 2157    Maudie Flakes, MD 07/25/18 2223

## 2018-07-25 NOTE — ED Triage Notes (Signed)
Pt presents with 3 day h/o L flank pain, L lower abdominal pain and L groin pain.  Pt reports nausea, vomiting and black liquid in colostomy bag.  Pt was seen at PCP and referred here.

## 2018-07-26 DIAGNOSIS — Z808 Family history of malignant neoplasm of other organs or systems: Secondary | ICD-10-CM | POA: Diagnosis not present

## 2018-07-26 DIAGNOSIS — K8020 Calculus of gallbladder without cholecystitis without obstruction: Secondary | ICD-10-CM | POA: Diagnosis not present

## 2018-07-26 DIAGNOSIS — N39490 Overflow incontinence: Secondary | ICD-10-CM | POA: Diagnosis present

## 2018-07-26 DIAGNOSIS — Z716 Tobacco abuse counseling: Secondary | ICD-10-CM | POA: Diagnosis not present

## 2018-07-26 DIAGNOSIS — Z800 Family history of malignant neoplasm of digestive organs: Secondary | ICD-10-CM | POA: Diagnosis not present

## 2018-07-26 DIAGNOSIS — Z932 Ileostomy status: Secondary | ICD-10-CM | POA: Diagnosis not present

## 2018-07-26 DIAGNOSIS — Z833 Family history of diabetes mellitus: Secondary | ICD-10-CM | POA: Diagnosis not present

## 2018-07-26 DIAGNOSIS — F17210 Nicotine dependence, cigarettes, uncomplicated: Secondary | ICD-10-CM | POA: Diagnosis present

## 2018-07-26 DIAGNOSIS — K529 Noninfective gastroenteritis and colitis, unspecified: Secondary | ICD-10-CM | POA: Diagnosis present

## 2018-07-26 DIAGNOSIS — Z801 Family history of malignant neoplasm of trachea, bronchus and lung: Secondary | ICD-10-CM | POA: Diagnosis not present

## 2018-07-26 DIAGNOSIS — T8143XA Infection following a procedure, organ and space surgical site, initial encounter: Secondary | ICD-10-CM | POA: Diagnosis present

## 2018-07-26 DIAGNOSIS — F329 Major depressive disorder, single episode, unspecified: Secondary | ICD-10-CM | POA: Diagnosis present

## 2018-07-26 DIAGNOSIS — F411 Generalized anxiety disorder: Secondary | ICD-10-CM | POA: Diagnosis present

## 2018-07-26 DIAGNOSIS — Y838 Other surgical procedures as the cause of abnormal reaction of the patient, or of later complication, without mention of misadventure at the time of the procedure: Secondary | ICD-10-CM | POA: Diagnosis present

## 2018-07-26 DIAGNOSIS — G629 Polyneuropathy, unspecified: Secondary | ICD-10-CM | POA: Diagnosis present

## 2018-07-26 DIAGNOSIS — Z9221 Personal history of antineoplastic chemotherapy: Secondary | ICD-10-CM | POA: Diagnosis not present

## 2018-07-26 DIAGNOSIS — K651 Peritoneal abscess: Secondary | ICD-10-CM | POA: Diagnosis not present

## 2018-07-26 DIAGNOSIS — Z85048 Personal history of other malignant neoplasm of rectum, rectosigmoid junction, and anus: Secondary | ICD-10-CM | POA: Diagnosis not present

## 2018-07-26 DIAGNOSIS — Z79891 Long term (current) use of opiate analgesic: Secondary | ICD-10-CM | POA: Diagnosis not present

## 2018-07-26 DIAGNOSIS — Z89422 Acquired absence of other left toe(s): Secondary | ICD-10-CM | POA: Diagnosis not present

## 2018-07-26 DIAGNOSIS — Z79899 Other long term (current) drug therapy: Secondary | ICD-10-CM | POA: Diagnosis not present

## 2018-07-26 DIAGNOSIS — Z1509 Genetic susceptibility to other malignant neoplasm: Secondary | ICD-10-CM | POA: Diagnosis not present

## 2018-07-26 DIAGNOSIS — Z825 Family history of asthma and other chronic lower respiratory diseases: Secondary | ICD-10-CM | POA: Diagnosis not present

## 2018-07-26 LAB — CBG MONITORING, ED: Glucose-Capillary: 60 mg/dL — ABNORMAL LOW (ref 70–99)

## 2018-07-26 MED ORDER — SODIUM CHLORIDE 0.9 % IV SOLN
INTRAVENOUS | Status: DC | PRN
  Administered 2018-07-26 – 2018-07-28 (×3): 250 mL via INTRAVENOUS

## 2018-07-26 MED ORDER — PHENOL 1.4 % MT LIQD
1.0000 | OROMUCOSAL | Status: DC | PRN
  Filled 2018-07-26: qty 177

## 2018-07-26 MED ORDER — LACTATED RINGERS IV BOLUS: 1000.0000 mL | Freq: Three times a day (TID) | INTRAVENOUS | Status: DC | PRN

## 2018-07-26 MED ORDER — NICOTINE 14 MG/24HR TD PT24
14.0000 mg | MEDICATED_PATCH | Freq: Every day | TRANSDERMAL | Status: DC
  Administered 2018-07-26 – 2018-07-27 (×2): 14 mg via TRANSDERMAL
  Filled 2018-07-26 (×2): qty 1

## 2018-07-26 MED ORDER — GUAIFENESIN-DM 100-10 MG/5ML PO SYRP: 10.0000 mL | ORAL_SOLUTION | ORAL | Status: DC | PRN

## 2018-07-26 MED ORDER — MENTHOL 3 MG MT LOZG: 1.0000 | LOZENGE | OROMUCOSAL | Status: DC | PRN

## 2018-07-26 MED ORDER — LIP MEDEX EX OINT
1.0000 "application " | TOPICAL_OINTMENT | Freq: Two times a day (BID) | CUTANEOUS | Status: DC
  Administered 2018-07-26 – 2018-07-27 (×3): 1 via TOPICAL
  Filled 2018-07-26: qty 7

## 2018-07-26 MED ORDER — HYDROCORTISONE 1 % EX CREA
1.0000 "application " | TOPICAL_CREAM | Freq: Three times a day (TID) | CUTANEOUS | Status: DC | PRN
  Filled 2018-07-26: qty 28

## 2018-07-26 MED ORDER — ALUM & MAG HYDROXIDE-SIMETH 200-200-20 MG/5ML PO SUSP: 30.0000 mL | Freq: Four times a day (QID) | ORAL | Status: DC | PRN

## 2018-07-26 MED ORDER — HYDROCORTISONE 2.5 % RE CREA
1.0000 "application " | TOPICAL_CREAM | Freq: Four times a day (QID) | RECTAL | Status: DC | PRN
  Filled 2018-07-26: qty 28.35

## 2018-07-26 MED ORDER — MAGIC MOUTHWASH
15.0000 mL | Freq: Four times a day (QID) | ORAL | Status: DC | PRN
  Filled 2018-07-26: qty 15

## 2018-07-26 MED ORDER — ENSURE SURGERY PO LIQD
237.0000 mL | Freq: Two times a day (BID) | ORAL | Status: DC
  Administered 2018-07-26 – 2018-07-27 (×4): 237 mL via ORAL
  Filled 2018-07-26 (×5): qty 237

## 2018-07-26 NOTE — Progress Notes (Signed)
[name] [id number] 1971/09/16  CARE TEAM:  PCP: Nuala Alpha, DO  Outpatient Care Team: Patient Care Team: Nuala Alpha, DO as PCP - General (Family Medicine) Michael Boston, MD as Consulting Physician (General Surgery) [name], Kirke Corin, MD as Consulting Physician (Gastroenterology) Kyung Rudd, MD as Consulting Physician (Radiation Oncology) Truitt Merle, MD as Consulting Physician (Oncology)  Inpatient Treatment Team: Treatment Team: Attending Provider: Michael Boston, MD; Consulting Physician: Edison Pace, Md, MD; Registered Nurse: Arminda Resides, RN; Technician: Adrian Saran, McKenzie A, NT   Problem List:   Principal Problem:   Intraabdominal abscess Active Problems:   Chronic diarrhea s/p proctocolectomy & ileoanal J pouch   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   Ileostomy in place    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 RECTAL EXAM UNDER ANESTHESIA  SURGEON:  Adin Hector, MD, [place], [facility] Stay = 0 days  Assessment  Abdominal pain nausea with concerning delayed interloop abscess versus ileus.  Plan:  IV fluids.  Retry p.o.  I see no good window for drainage the abscess since it surrounded by multiple small bowel loops.  We will start with antibiotics first and see if it will resolve with follow-up CT in about 5 days.  He is gone back to smoking.  I tried to strongly encourage him to quit.  He tells me it is impossible to do.  VTE prophylaxis- SCDs, etc  Assume Zoloft for depression and anxiety.  Try and encourage him to quit smoking.  Mobilize as tolerated to help recovery  20 minutes spent in review, evaluation, examination,  counseling, and coordination of care.  More than 50% of that time was spent in counseling.  07/26/2018    Subjective: (Chief complaint)  Feeling less nauseated.  Really wants to try liquids.  Notes he still has some anal stump drainage.  Not high-volume though.  Pain under better control now.  Objective:  Vital signs:  Vitals:   07/25/18 2234 07/25/18 2238 07/26/18 0122 07/26/18 0521  BP: 125/86  118/81 114/75  Pulse: (!) 101  (!) 101 97  Resp: '18  18 18  '$ Temp: 98.4 F (36.9 C)  98.1 F (36.7 C) 98.2 F (36.8 C)  TempSrc: Oral  Oral Oral  SpO2: 99%  97% 95%  Weight:  63.9 kg (140 lb 14 oz)    Height:  6' (1.829 m)      Last BM Date: 07/25/18  Intake/Output   Yesterday:  08/06 0701 - 08/07 0700 In: 2043.9 [P.O.:60; I.V.:883.9; IV Piggyback:1100] Out: 451 [Urine:300; Stool:151] This shift:  No intake/output data recorded.  Bowel function:  Flatus: YES  BM:  Scant effluent in bag  Drain: (No drain)   Physical Exam:  General: Pt awake/alert/oriented x4 in no acute distress Eyes: PERRL, normal EOM.  Sclera clear.  No icterus Neuro: CN II-XII intact w/o focal sensory/motor deficits. Lymph: No head/neck/groin lymphadenopathy Psych:  No delerium/psychosis/paranoia HENT: Normocephalic, Mucus membranes moist.  No thrush Neck: Supple, No tracheal deviation Chest: No chest wall pain w good excursion CV:  Pulses intact.  Regular rhythm MS: Normal AROM mjr joints.  No obvious deformity  Abdomen: Soft.  Mildy distended.  Tenderness at LLQ only.  No evidence of peritonitis.  No incarcerated hernias.  Held off on rectal examination today. Ext:   No deformity.  No mjr edema.  No cyanosis Skin: No petechiae / purpura  Results:   Labs: Results for orders placed or performed during the hospital encounter of 07/25/18 (from the past 48 hour(s))  CBC     Status: Abnormal   Collection Time: 07/25/18  3:52 PM  Result Value Ref Range   WBC 11.7 (H) 4.0 - 10.5 K/uL    RBC 5.06 4.22 - 5.81 MIL/uL   Hemoglobin 14.0 13.0 - 17.0 g/dL   HCT 43.3 39.0 - 52.0 %   MCV 85.6 78.0 - 100.0 fL   MCH 27.7 26.0 - 34.0 pg   MCHC 32.3 30.0 - 36.0 g/dL   RDW 14.7 11.5 - 15.5 %   Platelets 469 (H) 150 - 400 K/uL    Comment: Performed at [facility], [place] [address], [place], [place] [address]  Comprehensive metabolic panel     Status: Abnormal   Collection Time: 07/25/18  3:52 PM  Result Value Ref Range   Sodium 136 135 - 145 mmol/L   Potassium 3.9 3.5 - 5.1 mmol/L   Chloride 100 98 - 111 mmol/L   CO2 24 22 - 32 mmol/L   Glucose, Bld 83 70 - 99 mg/dL   BUN 10 6 - 20 mg/dL   Creatinine, Ser 0.90 0.61 - 1.24 mg/dL   Calcium 8.7 (L) 8.9 - 10.3 mg/dL   Total Protein 6.2 (L) 6.5 - 8.1 g/dL   Albumin 2.1 (L) 3.5 - 5.0 g/dL   AST 19 15 - 41 U/L   ALT 21 0 - 44 U/L   Alkaline Phosphatase 169 (H) 38 - 126 U/L   Total Bilirubin 1.1 0.3 - 1.2 mg/dL   GFR calc non Af Amer >60 >60 mL/min   GFR calc Af Amer >60 >60 mL/min    Comment: (NOTE) The eGFR has been calculated using the CKD EPI equation. This calculation has not been validated in all clinical situations. eGFR's persistently <60 mL/min signify possible Chronic Kidney Disease.    Anion gap 12 5 - 15    Comment: Performed at [facility] [address] [place], [place] [address]  Lipase, blood     Status: None   Collection Time: 07/25/18  3:52 PM  Result Value Ref Range   Lipase 25 11 - 51 U/L    Comment: Performed at [facility] [address] [place], [place] [address]  Urinalysis, Routine w reflex microscopic- may I&O cath if menses     Status: Abnormal   Collection Time: 07/25/18  5:05 PM  Result Value Ref Range   Color, Urine AMBER (A) YELLOW    Comment: BIOCHEMICALS MAY BE AFFECTED BY COLOR   APPearance CLEAR CLEAR   Specific Gravity, Urine 1.025 1.005 - 1.030   pH 6.0 5.0 - 8.0   Glucose, UA NEGATIVE NEGATIVE mg/dL   Hgb urine dipstick NEGATIVE NEGATIVE   Bilirubin Urine  NEGATIVE NEGATIVE   Ketones, ur 20 (A) NEGATIVE mg/dL   Protein, ur NEGATIVE NEGATIVE mg/dL   Nitrite NEGATIVE NEGATIVE   Leukocytes, UA NEGATIVE NEGATIVE    Comment: Performed at [facility] [address], [place], [place] [address]  CK     Status: Abnormal   Collection Time: 07/25/18  6:26 PM  Result Value Ref Range   Total CK 16 (L) 49 - 397 U/L    Comment: Performed  at [facility], [place] [address], [place], [place] [address]    Imaging / Studies: Ct Abdomen Pelvis W Contrast  Result Date: 07/25/2018 CLINICAL DATA:  Left flank and lower quadrant pain x3 days. History of rectal cancer. EXAM: CT ABDOMEN AND PELVIS WITH CONTRAST TECHNIQUE: Multidetector CT imaging of the abdomen and pelvis was performed using the standard protocol following bolus administration of intravenous contrast. CONTRAST:  155m OMNIPAQUE IOHEXOL 300 MG/ML  SOLN COMPARISON:  12/06/2017 FINDINGS: Lower chest: Normal heart size without pericardial effusion. Emphysematous disease at the lung bases. No effusion or dominant mass. No pneumothorax. Hepatobiliary: Homogeneous attenuation of the liver. Tiny layering gallstones within a physiologically distended gallbladder. No secondary signs of acute cholecystitis. No biliary dilatation. Pancreas: Normal Spleen: Normal size spleen with adjacent splenule. Adrenals/Urinary Tract: Normal bilateral adrenal glands. No enhancing renal mass nor obstructive uropathy. The urinary bladder is decompressed which may explain the circumferential mild mural thickening noted. Cystitis is not entirely excluded. Stomach/Bowel: The stomach is nondistended. The duodenal sweep is unremarkable. The patient is status post total proctocolectomy with right upper quadrant ostomy site noted and ileo anal anastomosis present. Within the left lower quadrant is a lobulated enhancing fluid collection with largest component measuring approximately 4.7 x 3.2 x 2.8 cm concerning for an interloop abscess,  trefoil in appearance on series 3/54 with diffuse moderate inflammatory thickening of adjacent small bowel loops. Given lack of bowel signature noted within this collection, dilated enhancing loops of small bowel are believed unlikely as are pseudosacculations as would be seen in inflammatory bowel disease. Likewise, intramural abscesses are believed less likely as it appears to demonstrate positive mass effect on adjacent bowel loops causing some displacement. No bowel obstruction or free air. Vascular/Lymphatic: Patent nonaneurysmal abdominal aorta and branch vessels. No inguinal, pelvic sidewall nor mesenteric lymphadenopathy. Reproductive: Prostate is unremarkable. Other: Free air. Musculoskeletal: No aggressive osseous lesions. IMPRESSION: Findings concerning for an interloop abscess in the left lower quadrant of the abdomen, trefoil in appearance on series 3/54 with largest component measuring 4.7 x 3.2 x 2.8 cm. Surrounding small bowel inflammation is noted without obstruction or free air. Redemonstration of ileoanal anastomosis, total proctocolitis and right sided ostomy. COPD of the lung bases. Uncomplicated cholelithiasis. Electronically Signed   By: DAshley RoyaltyM.D.   On: 07/25/2018 18:27    Medications / Allergies: per chart  Antibiotics: Anti-infectives (From admission, onward)   Start     Dose/Rate Route Frequency Ordered Stop   07/26/18 0200  piperacillin-tazobactam (ZOSYN) IVPB 3.375 g     3.375 g 12.5 mL/hr over 240 Minutes Intravenous Every 8 hours 07/25/18 2053     07/25/18 1900  piperacillin-tazobactam (ZOSYN) IVPB 3.375 g     3.375 g 100 mL/hr over 30 Minutes Intravenous  Once 07/25/18 1858 07/25/18 1953        Note: Portions of this report may have been transcribed using voice recognition software. Every effort was made to ensure accuracy; however, inadvertent computerized transcription errors may be present.   Any transcriptional errors that result from this process are  unintentional.     SAdin Hector MD, FACS, MASCRS Gastrointestinal and Minimally Invasive Surgery    [address] [place] [phone] / Paging ([phone]

## 2018-07-27 LAB — URINE CULTURE

## 2018-07-27 MED ORDER — SODIUM CHLORIDE 0.9 % IV SOLN: 250.0000 mL | INTRAVENOUS | Status: DC | PRN

## 2018-07-27 MED ORDER — FAMOTIDINE 20 MG PO TABS
20.0000 mg | ORAL_TABLET | Freq: Two times a day (BID) | ORAL | Status: DC
  Administered 2018-07-27: 20 mg via ORAL
  Filled 2018-07-27: qty 1

## 2018-07-27 MED ORDER — SODIUM CHLORIDE 0.9% FLUSH: 3.0000 mL | INTRAVENOUS | Status: DC | PRN

## 2018-07-27 MED ORDER — SODIUM CHLORIDE 0.9% FLUSH
3.0000 mL | Freq: Two times a day (BID) | INTRAVENOUS | Status: DC
  Administered 2018-07-27: 3 mL via INTRAVENOUS

## 2018-07-27 MED ORDER — LACTATED RINGERS IV BOLUS: 1000.0000 mL | Freq: Three times a day (TID) | INTRAVENOUS | Status: DC | PRN

## 2018-07-27 NOTE — Progress Notes (Signed)
Patient was encouraged to ambulate during the night, but stated he was too sleepy and tired and would do so during the day time.

## 2018-07-27 NOTE — Progress Notes (Signed)
[name] [id number] 21-Jul-1971  CARE TEAM:  PCP: Nuala Alpha, DO  Outpatient Care Team: Patient Care Team: Nuala Alpha, DO as PCP - General (Family Medicine) Michael Boston, MD as Consulting Physician (General Surgery) [place], Kirke Corin, MD as Consulting Physician (Gastroenterology) Kyung Rudd, MD as Consulting Physician (Radiation Oncology) Truitt Merle, MD as Consulting Physician (Oncology)  Inpatient Treatment Team: Treatment Team: Attending Provider: Michael Boston, MD; Technician: Adrian Saran, Arnette Schaumann, NT; Technician: Rosalia Hammers, NT; Registered Nurse: Vicente Serene, RN   Problem List:   Principal Problem:   Intraabdominal abscess Active Problems:   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 RECTAL EXAM UNDER ANESTHESIA  SURGEON:  Adin Hector, MD, [place], [facility] Stay = 1 days  Assessment  Abdominal pain nausea with concerning delayed interloop abscess versus ileus.  Slowly improving  Plan:  Advance from clear liquids to dysphagia 1/full liquid diet.    Stop IV fluids.  BAckup boluses PRN  I see no good window for drainage the abscess since it surrounded by multiple small bowel loops.  We will start with antibiotics first and see if it will resolve with follow-up CT in about 5 days.  He is gone back to smoking.  I tried to strongly encourage him to quit.  He tells me it is impossible to do.  VTE prophylaxis- SCDs, etc  Assume Zoloft for depression  and anxiety.  Try and encourage him to quit smoking.  Mobilize as tolerated to help recovery  D/C patient from hospital when patient meets criteria (anticipate in 1-3 day(s)):  Tolerating oral intake well Ambulating well Adequate pain control without IV medications Urinating  Having flatus Disposition planning in place   20 minutes spent in review, evaluation, examination, counseling, and coordination of care.  More than 50% of that time was spent in counseling.  07/27/2018    Subjective: (Chief complaint)  Tolerating clear liquids.  Wants to eat a big bowl of cereal.  Notes he still has some anal stump drainage.  Not high-volume though.  Pain under better control now.  Objective:  Vital signs:  Vitals:   07/26/18 1804 07/26/18 2113 07/27/18 0206 07/27/18 0533  BP: 95/73 106/79 91/63 106/79  Pulse: 65 60 68 77  Resp: '20 16 18 16  '$ Temp: 97.9 F (36.6 C)   98.4 F (36.9 C)  TempSrc: Oral   Oral  SpO2: 99% 100% 92% 99%  Weight:      Height:        Last BM Date: 07/26/18  Intake/Output   Yesterday:  08/07 0701 - 08/08 0700 In: 3196.4 [P.O.:1525; I.V.:1432.5; IV Piggyback:238.9] Out: 1200 [Urine:900; Stool:300] This shift:  No intake/output data recorded.  Bowel function:  Flatus: YES  BM:  YES  Drain: (No drain)   Physical Exam:  General: Pt awake/alert/oriented x4 in no acute distress Eyes: PERRL, normal EOM.  Sclera clear.  No icterus Neuro: CN II-XII intact w/o focal sensory/motor deficits. Lymph: No head/neck/groin lymphadenopathy Psych:  No  delerium/psychosis/paranoia HENT: Normocephalic, Mucus membranes moist.  No thrush Neck: Supple, No tracheal deviation Chest: No chest wall pain w good excursion CV:  Pulses intact.  Regular rhythm MS: Normal AROM mjr joints.  No obvious deformity  Abdomen: Soft.  Nondistended.  Tenderness at LLQ only.  No evidence of peritonitis.  No incarcerated hernias.  Held off on rectal examination today. Ext:    No deformity.  No mjr edema.  No cyanosis Skin: No petechiae / purpura  Results:   Labs: Results for orders placed or performed during the hospital encounter of 07/25/18 (from the past 48 hour(s))  CBC     Status: Abnormal   Collection Time: 07/25/18  3:52 PM  Result Value Ref Range   WBC 11.7 (H) 4.0 - 10.5 K/uL   RBC 5.06 4.22 - 5.81 MIL/uL   Hemoglobin 14.0 13.0 - 17.0 g/dL   HCT 43.3 39.0 - 52.0 %   MCV 85.6 78.0 - 100.0 fL   MCH 27.7 26.0 - 34.0 pg   MCHC 32.3 30.0 - 36.0 g/dL   RDW 14.7 11.5 - 15.5 %   Platelets 469 (H) 150 - 400 K/uL    Comment: Performed at [facility], [place] [address] [place], [place] [address]  Comprehensive metabolic panel     Status: Abnormal   Collection Time: 07/25/18  3:52 PM  Result Value Ref Range   Sodium 136 135 - 145 mmol/L   Potassium 3.9 3.5 - 5.1 mmol/L   Chloride 100 98 - 111 mmol/L   CO2 24 22 - 32 mmol/L   Glucose, Bld 83 70 - 99 mg/dL   BUN 10 6 - 20 mg/dL   Creatinine, Ser 0.90 0.61 - 1.24 mg/dL   Calcium 8.7 (L) 8.9 - 10.3 mg/dL   Total Protein 6.2 (L) 6.5 - 8.1 g/dL   Albumin 2.1 (L) 3.5 - 5.0 g/dL   AST 19 15 - 41 U/L   ALT 21 0 - 44 U/L   Alkaline Phosphatase 169 (H) 38 - 126 U/L   Total Bilirubin 1.1 0.3 - 1.2 mg/dL   GFR calc non Af Amer >60 >60 mL/min   GFR calc Af Amer >60 >60 mL/min    Comment: (NOTE) The eGFR has been calculated using the CKD EPI equation. This calculation has not been validated in all clinical situations. eGFR's persistently <60 mL/min signify possible Chronic Kidney Disease.    Anion gap 12 5 - 15    Comment: Performed at [facility] [address] [place], [place] [address]  Lipase, blood     Status: None   Collection Time: 07/25/18  3:52 PM  Result Value Ref Range   Lipase 25 11 - 51 U/L    Comment: Performed at [facility] [address], [place], [place] [address]  Urinalysis, Routine w reflex microscopic- may I&O cath if menses     Status: Abnormal   Collection  Time: 07/25/18  5:05 PM  Result Value Ref Range   Color, Urine AMBER (A) YELLOW    Comment: BIOCHEMICALS MAY BE AFFECTED BY COLOR   APPearance CLEAR CLEAR   Specific Gravity, Urine 1.025 1.005 - 1.030   pH 6.0 5.0 - 8.0   Glucose, UA NEGATIVE NEGATIVE mg/dL   Hgb urine dipstick NEGATIVE NEGATIVE   Bilirubin Urine NEGATIVE NEGATIVE   Ketones, ur 20 (A) NEGATIVE mg/dL   Protein, ur NEGATIVE NEGATIVE mg/dL   Nitrite NEGATIVE NEGATIVE   Leukocytes, UA NEGATIVE  NEGATIVE    Comment: Performed at [facility], [place] [address], [place], [place] [address]  CBG monitoring, ED     Status: Abnormal   Collection Time: 07/25/18  6:03 PM  Result Value Ref Range   Glucose-Capillary 60 (L) 70 - 99 mg/dL  CK     Status: Abnormal   Collection Time: 07/25/18  6:26 PM  Result Value Ref Range   Total CK 16 (L) 49 - 397 U/L    Comment: Performed at [facility], [place] [address] [place], [place] [address]  Urine culture     Status: Abnormal   Collection Time: 07/25/18  9:27 PM  Result Value Ref Range   Specimen Description URINE, RANDOM    Special Requests NONE    Culture (A)     <10,000 COLONIES/mL INSIGNIFICANT GROWTH Performed at [facility] [address], [place], [place] [address]    Report Status 07/27/2018 FINAL     Imaging / Studies: Ct Abdomen Pelvis W Contrast  Addendum Date: 07/26/2018   ADDENDUM REPORT: 07/26/2018 17:34 ADDENDUM: Correction: In the impression it should state: Total proctocolectomy with small bowel ostomy in the right upper quadrant. Chain sutures are redemonstrated in the region of the anus. Electronically Signed   By: Ashley Royalty M.D.   On: 07/26/2018 17:34   Result Date: 07/26/2018 CLINICAL DATA:  Left flank and lower quadrant pain x3 days. History of rectal cancer. EXAM: CT ABDOMEN AND PELVIS WITH CONTRAST TECHNIQUE: Multidetector CT imaging of the abdomen and pelvis was performed using the standard protocol following bolus administration of  intravenous contrast. CONTRAST:  161m OMNIPAQUE IOHEXOL 300 MG/ML  SOLN COMPARISON:  12/06/2017 FINDINGS: Lower chest: Normal heart size without pericardial effusion. Emphysematous disease at the lung bases. No effusion or dominant mass. No pneumothorax. Hepatobiliary: Homogeneous attenuation of the liver. Tiny layering gallstones within a physiologically distended gallbladder. No secondary signs of acute cholecystitis. No biliary dilatation. Pancreas: Normal Spleen: Normal size spleen with adjacent splenule. Adrenals/Urinary Tract: Normal bilateral adrenal glands. No enhancing renal mass nor obstructive uropathy. The urinary bladder is decompressed which may explain the circumferential mild mural thickening noted. Cystitis is not entirely excluded. Stomach/Bowel: The stomach is nondistended. The duodenal sweep is unremarkable. The patient is status post total proctocolectomy with right upper quadrant ostomy site noted and ileo anal anastomosis present. Within the left lower quadrant is a lobulated enhancing fluid collection with largest component measuring approximately 4.7 x 3.2 x 2.8 cm concerning for an interloop abscess, trefoil in appearance on series 3/54 with diffuse moderate inflammatory thickening of adjacent small bowel loops. Given lack of bowel signature noted within this collection, dilated enhancing loops of small bowel are believed unlikely as are pseudosacculations as would be seen in inflammatory bowel disease. Likewise, intramural abscesses are believed less likely as it appears to demonstrate positive mass effect on adjacent bowel loops causing some displacement. No bowel obstruction or free air. Vascular/Lymphatic: Patent nonaneurysmal abdominal aorta and branch vessels. No inguinal, pelvic sidewall nor mesenteric lymphadenopathy. Reproductive: Prostate is unremarkable. Other: Free air. Musculoskeletal: No aggressive osseous lesions. IMPRESSION: Findings concerning for an interloop abscess in  the left lower quadrant of the abdomen, trefoil in appearance on series 3/54 with largest component measuring 4.7 x 3.2 x 2.8 cm. Surrounding small bowel inflammation is noted without obstruction or free air. Redemonstration of ileoanal anastomosis, total proctocolitis and right sided ostomy. COPD of the lung bases. Uncomplicated cholelithiasis. Electronically Signed: By: DMeredith LeedsD.  On: 07/25/2018 18:27    Medications / Allergies: per chart  Antibiotics: Anti-infectives (From admission, onward)   Start     Dose/Rate Route Frequency Ordered Stop   07/26/18 0200  piperacillin-tazobactam (ZOSYN) IVPB 3.375 g     3.375 g 12.5 mL/hr over 240 Minutes Intravenous Every 8 hours 07/25/18 2053     07/25/18 1900  piperacillin-tazobactam (ZOSYN) IVPB 3.375 g     3.375 g 100 mL/hr over 30 Minutes Intravenous  Once 07/25/18 1858 07/25/18 1953        Note: Portions of this report may have been transcribed using voice recognition software. Every effort was made to ensure accuracy; however, inadvertent computerized transcription errors may be present.   Any transcriptional errors that result from this process are unintentional.     Adin Hector, MD, FACS, MASCRS Gastrointestinal and Minimally Invasive Surgery    [address] [place], [place] [address] [phone] Main / Paging [phone] Fax

## 2018-07-28 LAB — POTASSIUM: POTASSIUM: 3.5 mmol/L (ref 3.5–5.1)

## 2018-07-28 LAB — CBC
HCT: 35.6 % — ABNORMAL LOW (ref 39.0–52.0)
HEMOGLOBIN: 11.7 g/dL — AB (ref 13.0–17.0)
MCH: 27.8 pg (ref 26.0–34.0)
MCHC: 32.9 g/dL (ref 30.0–36.0)
MCV: 84.6 fL (ref 78.0–100.0)
PLATELETS: 433 10*3/uL — AB (ref 150–400)
RBC: 4.21 MIL/uL — ABNORMAL LOW (ref 4.22–5.81)
RDW: 15 % (ref 11.5–15.5)
WBC: 8.2 10*3/uL (ref 4.0–10.5)

## 2018-07-28 LAB — CREATININE, SERUM
Creatinine, Ser: 0.87 mg/dL (ref 0.61–1.24)
GFR calc Af Amer: >60 mL/min (ref >60)
GFR calc non Af Amer: >60 mL/min (ref >60)

## 2018-07-28 LAB — MAGNESIUM: MAGNESIUM: 2 mg/dL (ref 1.7–2.4)

## 2018-07-28 MED ORDER — AMOXICILLIN-POT CLAVULANATE 875-125 MG PO TABS: 1.0000 | ORAL_TABLET | Freq: Two times a day (BID) | ORAL | 1 refills | Status: DC

## 2018-07-28 MED ORDER — OXYCODONE HCL 5 MG PO TABS: 5.0000 mg | ORAL_TABLET | Freq: Four times a day (QID) | ORAL | 0 refills | Status: DC | PRN

## 2018-07-28 MED ORDER — METHOCARBAMOL 500 MG PO TABS: 500.0000 mg | ORAL_TABLET | Freq: Four times a day (QID) | ORAL | 2 refills | Status: DC | PRN

## 2018-07-28 NOTE — Discharge Instructions (Signed)
Complete oral antibiotics (Augmentin) to allow abscess to go away.  Keep bowels soft and regular.  Make sure you are getting enough protein in your diet to heal.  Trying to quit smoking.  Follow-up closely to make sure you are improving.  You may benefit from a repeat CT scan of your abdomen to make sure the abscess is gone away and you have no new issues.  We will hold off as long as you are feeling better.   SURGERY: POST OP INSTRUCTIONS (Surgery for small bowel obstruction, colon resection, etc)   ######################################################################  EAT Gradually transition to a high fiber diet with a fiber supplement over the next few days after discharge  WALK Walk an hour a day.  Control your pain to do that.    CONTROL PAIN Control pain so that you can walk, sleep, tolerate sneezing/coughing, go up/down stairs.  HAVE A BOWEL MOVEMENT DAILY Keep your bowels regular to avoid problems.  OK to try a laxative to override constipation.  OK to use an antidairrheal to slow down diarrhea.  Call if not better after 2 tries  CALL IF YOU HAVE PROBLEMS/CONCERNS Call if you are still struggling despite following these instructions. Call if you have concerns not answered by these instructions  ######################################################################   DIET Follow a light diet the first few days at home.  Start with a bland diet such as soups, liquids, starchy foods, low fat foods, etc.  If you feel full, bloated, or constipated, stay on a ful liquid or pureed/blenderized diet for a few days until you feel better and no longer constipated. Be sure to drink plenty of fluids every day to avoid getting dehydrated (feeling dizzy, not urinating, etc.). Gradually add a fiber supplement to your diet over the next week.  Gradually get back to a regular solid diet.  Avoid fast food or heavy meals the first week as you are more likely to get nauseated. It is  expected for your digestive tract to need a few months to get back to normal.  It is common for your bowel movements and stools to be irregular.  You will have occasional bloating and cramping that should eventually fade away.  Until you are eating solid food normally, off all pain medications, and back to regular activities; your bowels will not be normal. Focus on eating a low-fat, high fiber diet the rest of your life (See Getting to [place], below).  CARE of your INCISION or WOUND It is good for closed incision and even open wounds to be washed every day.  Shower every day.  Short baths are fine.  Wash the incisions and wounds clean with soap & water.    If you have a closed incision(s), wash the incision with soap & water every day.  You may leave closed incisions open to air if it is dry.   You may cover the incision with clean gauze & replace it after your daily shower for comfort. If you have skin tapes (Steristrips) or skin glue (Dermabond) on your incision, leave them in place.  They will fall off on their own like a scab.  You may trim any edges that curl up with clean scissors.  If you have staples, set up an appointment for them to be removed in the office in 10 days after surgery.  If you have a drain, wash around the skin exit site with soap & water and place a new dressing of gauze or band aid around  the skin every day.  Keep the drain site clean & dry.    If you have an open wound with packing, see wound care instructions.  In general, it is encouraged that you remove your dressing and packing, shower with soap & water, and replace your dressing once a day.  Pack the wound with clean gauze moistened with normal (0.9%) saline to keep the wound moist & uninfected.  Pressure on the dressing for 30 minutes will stop most wound bleeding.  Eventually your body will heal & pull the open wound closed over the next few months.  Raw open wounds will occasionally bleed or secrete yellow  drainage until it heals closed.  Drain sites will drain a little until the drain is removed.  Even closed incisions can have mild bleeding or drainage the first few days until the skin edges scab over & seal.   If you have an open wound with a wound vac, see wound vac care instructions.     ACTIVITIES as tolerated Start light daily activities --- self-care, walking, climbing stairs-- beginning the day after surgery.  Gradually increase activities as tolerated.  Control your pain to be active.  Stop when you are tired.  Ideally, walk several times a day, eventually an hour a day.   Most people are back to most day-to-day activities in a few weeks.  It takes 4-8 weeks to get back to unrestricted, intense activity. If you can walk 30 minutes without difficulty, it is safe to try more intense activity such as jogging, treadmill, bicycling, low-impact aerobics, swimming, etc. Save the most intensive and strenuous activity for last (Usually 4-8 weeks after surgery) such as sit-ups, heavy lifting, contact sports, etc.  Refrain from any intense heavy lifting or straining until you are off narcotics for pain control.  You will have off days, but things should improve week-by-week. DO NOT PUSH THROUGH PAIN.  Let pain be your guide: If it hurts to do something, don't do it.  Pain is your body warning you to avoid that activity for another week until the pain goes down. You may drive when you are no longer taking narcotic prescription pain medication, you can comfortably wear a seatbelt, and you can safely make sudden turns/stops to protect yourself without hesitating due to pain. You may have sexual intercourse when it is comfortable. If it hurts to do something, stop.  MEDICATIONS Take your usually prescribed home medications unless otherwise directed.   Blood thinners:  Usually you can restart any strong blood thinners after the second postoperative day.  It is OK to take aspirin right away.     If you are  on strong blood thinners (warfarin/Coumadin, Plavix, Xerelto, Eliquis, Pradaxa, etc), discuss with your surgeon, medicine PCP, and/or cardiologist for instructions on when to restart the blood thinner & if blood monitoring is needed (PT/INR blood check, etc).     PAIN CONTROL Pain after surgery or related to activity is often due to strain/injury to muscle, tendon, nerves and/or incisions.  This pain is usually short-term and will improve in a few months.  To help speed the process of healing and to get back to regular activity more quickly, DO THE FOLLOWING THINGS TOGETHER: 1. Increase activity gradually.  DO NOT PUSH THROUGH PAIN 2. Use Ice and/or Heat 3. Try Gentle Massage and/or Stretching 4. Take over the counter pain medication 5. Take Narcotic prescription pain medication for more severe pain  Good pain control = faster recovery.  It is  better to take more medicine to be more active than to stay in bed all day to avoid medications. 1.  Increase activity gradually Avoid heavy lifting at first, then increase to lifting as tolerated over the next 6 weeks. Do not push through the pain.  Listen to your body and avoid positions and maneuvers than reproduce the pain.  Wait a few days before trying something more intense Walking an hour a day is encouraged to help your body recover faster and more safely.  Start slowly and stop when getting sore.  If you can walk 30 minutes without stopping or pain, you can try more intense activity (running, jogging, aerobics, cycling, swimming, treadmill, sex, sports, weightlifting, etc.) Remember: If it hurts to do it, then dont do it! 2. Use Ice and/or Heat You will have swelling and bruising around the incisions.  This will take several weeks to resolve. Ice packs or heating pads (6-8 times a day, 30-60 minutes at a time) will help sooth soreness & bruising. Some people prefer to use ice alone, heat alone, or alternate between ice & heat.  Experiment and  see what works best for you.  Consider trying ice for the first few days to help decrease swelling and bruising; then, switch to heat to help relax sore spots and speed recovery. Shower every day.  Short baths are fine.  It feels good!  Keep the incisions and wounds clean with soap & water.   3. Try Gentle Massage and/or Stretching Massage at the area of pain many times a day Stop if you feel pain - do not overdo it 4. Take over the counter pain medication This helps the muscle and nerve tissues become less irritable and calm down faster Choose ONE of the following over-the-counter anti-inflammatory medications: Acetaminophen 500mg  tabs (Tylenol) 1-2 pills with every meal and just before bedtime (avoid if you have liver problems or if you have acetaminophen in you narcotic prescription) Naproxen 220mg  tabs (ex. Aleve, Naprosyn) 1-2 pills twice a day (avoid if you have kidney, stomach, IBD, or bleeding problems) Ibuprofen 200mg  tabs (ex. Advil, Motrin) 3-4 pills with every meal and just before bedtime (avoid if you have kidney, stomach, IBD, or bleeding problems) Take with food/snack several times a day as directed for at least 2 weeks to help keep pain / soreness down & more manageable. 5. Take Narcotic prescription pain medication for more severe pain A prescription for strong pain control is often given to you upon discharge (for example: oxycodone/Percocet, hydrocodone/Norco/Vicodin, or tramadol/Ultram) Take your pain medication as prescribed. Be mindful that most narcotic prescriptions contain Tylenol (acetaminophen) as well - avoid taking too much Tylenol. If you are having problems/concerns with the prescription medicine (does not control pain, nausea, vomiting, rash, itching, etc.), please call us [phone] to see if we need to switch you to a different pain medicine that will work better for you and/or control your side effects better. If you need a refill on your pain medication, you  must call the office before 4 pm and on weekdays only.  By federal law, prescriptions for narcotics cannot be called into a pharmacy.  They must be filled out on paper & picked up from our office by the patient or authorized caretaker.  Prescriptions cannot be filled after 4 pm nor on weekends.    WHEN TO CALL us [phone] Severe uncontrolled or worsening pain  Fever over 101 F (38.5 C) Concerns with the incision: Worsening pain, redness, rash/hives, swelling,  bleeding, or drainage Reactions / problems with new medications (itching, rash, hives, nausea, etc.) Nausea and/or vomiting Difficulty urinating Difficulty breathing Worsening fatigue, dizziness, lightheadedness, blurred vision Other concerns If you are not getting better after two weeks or are noticing you are getting worse, contact our office [phone] for further advice.  We may need to adjust your medications, re-evaluate you in the office, send you to the emergency room, or see what other things we can do to help. The clinic staff is available to answer your questions during regular business hours (8:30am-5pm).  Please dont hesitate to call and ask to speak to one of our nurses for clinical concerns.    A surgeon from [facility] is always on call at the hospitals 24 hours/day If you have a medical emergency, go to the nearest emergency room or call 911.  FOLLOW UP in our office One the day of your discharge from the hospital (or the next business weekday), please call [facility] to set up or confirm an appointment to see your surgeon in the office for a follow-up appointment.  Usually it is 2-3 weeks after your surgery.   If you have skin staples at your incision(s), let the office know so we can set up a time in the office for the nurse to remove them (usually around 10 days after surgery). Make sure that you call for appointments the day of discharge (or the next business weekday) from the  hospital to ensure a convenient appointment time. IF YOU HAVE DISABILITY OR FAMILY LEAVE FORMS, BRING THEM TO THE OFFICE FOR PROCESSING.  DO NOT GIVE THEM TO YOUR DOCTOR.  [facility] [address] [place], [place], [place]  [address] ? [phone] - Main [phone] - [place],  [phone] - Fax [email or web address]  GETTING TO GOOD BOWEL HEALTH. It is expected for your digestive tract to need a few months to get back to normal.  It is common for your bowel movements and stools to be irregular.  You will have occasional bloating and cramping that should eventually fade away.  Until you are eating solid food normally, off all pain medications, and back to regular activities; your bowels will not be normal.   Avoiding constipation The goal: ONE SOFT BOWEL MOVEMENT A DAY!    Drink plenty of fluids.  Choose water first. TAKE A FIBER SUPPLEMENT EVERY DAY THE REST OF YOUR LIFE During your first week back home, gradually add back a fiber supplement every day Experiment which form you can tolerate.   There are many forms such as powders, tablets, wafers, gummies, etc Psyllium bran (Metamucil), methylcellulose (Citrucel), Miralax or Glycolax, Benefiber, Flax Seed.  Adjust the dose week-by-week (1/2 dose/day to 6 doses a day) until you are moving your bowels 1-2 times a day.  Cut back the dose or try a different fiber product if it is giving you problems such as diarrhea or bloating. Sometimes a laxative is needed to help jump-start bowels if constipated until the fiber supplement can help regulate your bowels.  If you are tolerating eating & you are farting, it is okay to try a gentle laxative such as double dose MiraLax, prune juice, or Milk of Magnesia.  Avoid using laxatives too often. Stool softeners can sometimes help counteract the constipating effects of narcotic pain medicines.  It can also cause diarrhea, so avoid using for too long. If you are  still constipated despite taking fiber daily, eating solids,  and a few doses of laxatives, call our office. Controlling diarrhea Try drinking liquids and eating bland foods for a few days to avoid stressing your intestines further. Avoid dairy products (especially milk & ice cream) for a short time.  The intestines often can lose the ability to digest lactose when stressed. Avoid foods that cause gassiness or bloating.  Typical foods include beans and other legumes, cabbage, broccoli, and dairy foods.  Avoid greasy, spicy, fast foods.  Every person has some sensitivity to other foods, so listen to your body and avoid those foods that trigger problems for you. Probiotics (such as active yogurt, Align, etc) may help repopulate the intestines and colon with normal bacteria and calm down a sensitive digestive tract Adding a fiber supplement gradually can help thicken stools by absorbing excess fluid and retrain the intestines to act more normally.  Slowly increase the dose over a few weeks.  Too much fiber too soon can backfire and cause cramping & bloating. It is okay to try and slow down diarrhea with a few doses of antidiarrheal medicines.   Bismuth subsalicylate (ex. Kayopectate, Pepto Bismol) for a few doses can help control diarrhea.  Avoid if pregnant.   Loperamide (Imodium) can slow down diarrhea.  Start with one tablet (2mg ) first.  Avoid if you are having fevers or severe pain.  ILEOSTOMY PATIENTS WILL HAVE CHRONIC DIARRHEA since their colon is not in use.    Drink plenty of liquids.  You will need to drink even more glasses of water/liquid a day to avoid getting dehydrated. Record output from your ileostomy.  Expect to empty the bag every 3-4 hours at first.  Most people with a permanent ileostomy empty their bag 4-6 times at the least.   Use antidiarrheal medicine (especially Imodium) several times a day to avoid getting dehydrated.  Start with a dose at bedtime & breakfast.  Adjust up or down  as needed.  Increase antidiarrheal medications as directed to avoid emptying the bag more than 8 times a day (every 3 hours). Work with your wound ostomy nurse to learn care for your ostomy.  See ostomy care instructions. TROUBLESHOOTING IRREGULAR BOWELS 1) Start with a soft & bland diet. No spicy, greasy, or fried foods.  2) Avoid gluten/wheat or dairy products from diet to see if symptoms improve. 3) Miralax 17gm or flax seed mixed in [place]. water or juice-daily. May use 2-4 times a day as needed. 4) Gas-X, Phazyme, etc. as needed for gas & bloating.  5) Prilosec (omeprazole) over-the-counter as needed 6)  Consider probiotics (Align, Activa, etc) to help calm the bowels down  Call your doctor if you are getting worse or not getting better.  Sometimes further testing (cultures, endoscopy, X-ray studies, CT scans, bloodwork, etc.) may be needed to help diagnose and treat the cause of the diarrhea. [facility], [place], [place], [place], [place]  [address] [phone] - Main.    [phone]  - Toll Free.   [phone] - Fax [email or web address]  STOP SMOKING!  We strongly recommend that you stop smoking.  Smoking increases the risk of surgery including infection in the form of an open wound, pus formation, abscess, hernia at an incision on the abdomen, etc.  You have an increased risk of other MAJOR complications such as stroke, heart attack, forming clots in the leg and/or lungs, and death.    Smoking Cessation Quitting smoking is important to your health and has many advantages. However, it  is not always easy to quit since nicotine is a very addictive drug. Often times, people try 3 times or more before being able to quit. This document explains the best ways for you to prepare to quit smoking. Quitting takes hard work and a lot of effort, but you can do it. ADVANTAGES OF QUITTING SMOKING  You will live longer, feel better, and live  better.  Your body will feel the impact of quitting smoking almost immediately.  Within 20 minutes, blood pressure decreases. Your pulse returns to its normal level.  After 8 hours, carbon monoxide levels in the blood return to normal. Your oxygen level increases.  After 24 hours, the chance of having a heart attack starts to decrease. Your breath, hair, and body stop smelling like smoke.  After 48 hours, damaged nerve endings begin to recover. Your sense of taste and smell improve.  After 72 hours, the body is virtually free of nicotine. Your bronchial tubes relax and breathing becomes easier.  After 2 to 12 weeks, lungs can hold more air. Exercise becomes easier and circulation improves.  The risk of having a heart attack, stroke, cancer, or lung disease is greatly reduced.  After 1 year, the risk of coronary heart disease is cut in half.  After 5 years, the risk of stroke falls to the same as a nonsmoker.  After 10 years, the risk of lung cancer is cut in half and the risk of other cancers decreases significantly.  After 15 years, the risk of coronary heart disease drops, usually to the level of a nonsmoker.  If you are pregnant, quitting smoking will improve your chances of having a healthy baby.  The people you live with, especially any children, will be healthier.  You will have extra money to spend on things other than cigarettes. QUESTIONS TO THINK ABOUT BEFORE ATTEMPTING TO QUIT You may want to talk about your answers with your caregiver.  Why do you want to quit?  If you tried to quit in the past, what helped and what did not?  What will be the most difficult situations for you after you quit? How will you plan to handle them?  Who can help you through the tough times? Your family? Friends? A caregiver?  What pleasures do you get from smoking? What ways can you still get pleasure if you quit? Here are some questions to ask your caregiver:  How can you help me to  be successful at quitting?  What medicine do you think would be best for me and how should I take it?  What should I do if I need more help?  What is smoking withdrawal like? How can I get information on withdrawal? GET READY  Set a quit date.  Change your environment by getting rid of all cigarettes, ashtrays, matches, and lighters in your home, car, or work. Do not let people smoke in your home.  Review your past attempts to quit. Think about what worked and what did not. GET SUPPORT AND ENCOURAGEMENT You have a better chance of being successful if you have help. You can get support in many ways.  Tell your family, friends, and co-workers that you are going to quit and need their support. Ask them not to smoke around you.  Get individual, group, or telephone counseling and support. Programs are available at [employer] and health centers. Call your local health department for information about programs in your area.  Spiritual beliefs and practices may help some  smokers quit.  Download a "quit meter" on your computer to keep track of quit statistics, such as how long you have gone without smoking, cigarettes not smoked, and money saved.  Get a self-help book about quitting smoking and staying off of tobacco. [place] yourself from urges to smoke. Talk to someone, go for a walk, or occupy your time with a task.  Change your normal routine. Take a different route to work. Drink tea instead of coffee. Eat breakfast in a different place.  Reduce your stress. Take a hot bath, exercise, or read a book.  Plan something enjoyable to do every day. Reward yourself for not smoking.  Explore interactive web-based programs that specialize in helping you quit. GET MEDICINE AND USE IT CORRECTLY Medicines can help you stop smoking and decrease the urge to smoke. Combining medicine with the above behavioral methods and support can greatly increase your  chances of successfully quitting smoking.  Nicotine replacement therapy helps deliver nicotine to your body without the negative effects and risks of smoking. Nicotine replacement therapy includes nicotine gum, lozenges, inhalers, nasal sprays, and skin patches. Some may be available over-the-counter and others require a prescription.  Antidepressant medicine helps people abstain from smoking, but how this works is unknown. This medicine is available by prescription.  Nicotinic receptor partial agonist medicine simulates the effect of nicotine in your brain. This medicine is available by prescription. Ask your caregiver for advice about which medicines to use and how to use them based on your health history. Your caregiver will tell you what side effects to look out for if you choose to be on a medicine or therapy. Carefully read the information on the package. Do not use any other product containing nicotine while using a nicotine replacement product.  RELAPSE OR DIFFICULT SITUATIONS Most relapses occur within the first 3 months after quitting. Do not be discouraged if you start smoking again. Remember, most people try several times before finally quitting. You may have symptoms of withdrawal because your body is used to nicotine. You may crave cigarettes, be irritable, feel very hungry, cough often, get headaches, or have difficulty concentrating. The withdrawal symptoms are only temporary. They are strongest when you first quit, but they will go away within 10 14 days. To reduce the chances of relapse, try to:  Avoid drinking alcohol. Drinking lowers your chances of successfully quitting.  Reduce the amount of caffeine you consume. Once you quit smoking, the amount of caffeine in your body increases and can give you symptoms, such as a rapid heartbeat, sweating, and anxiety.  Avoid smokers because they can make you want to smoke.  Do not let weight gain distract you. Many smokers will gain  weight when they quit, usually less than 10 pounds. Eat a healthy diet and stay active. You can always lose the weight gained after you quit.  Find ways to improve your mood other than smoking. FOR MORE INFORMATION  www.smokefree.gov    While it can be one of the most difficult things to do, the [place] community has programs to help you stop.  Consider talking with your primary care physician about options.  Also, Smoking Cessation classes are available through the [facility]:  The smoking cessation program is a proven-effective program from the American [employer] Association. The program is available for anyone 76 and older who currently smokes. The program lasts for 7 weeks and is 8 sessions. Each class will be approximately 1 1/2  hours. The program is every Tuesday.  All classes are 12-1:30pm and same location.  Event Location Information:  Location: [place] 2nd Floor Conference Room 2-037; located next to [facility] cross streets: [place] Entrance into the [facility] is adjacent to the [employer] main entrance. The conference room is located on the 2nd floor.  Parking Instructions: Visitor parking is adjacent to [employer] main entrance and the [place]    A smoking cessation program is also offered through the [facility]. Register online at [email or web address] or call [phone] for more information.   Tobacco cessation counseling is available at [facility]. Call [phone] for a free appointment.   Tobacco cessation classes also are available through the [facility] in [place]. For information, call [phone].   The [employer] features videos on tobacco cessation. Please consult your listings in the center of this book to find instructions on how to access this resource.   If you want more information, ask  your nurse.

## 2018-07-28 NOTE — Progress Notes (Signed)
Discharge and medication instructions reviewed with patient. Questions answered and patient denies further questions. Family member is coming to drive patient home. Donne Hazel, RN

## 2018-07-28 NOTE — Discharge Summary (Signed)
Physician Discharge Summary    Patient ID: [name] MRN: [id number] DOB/AGE: Feb 13, 1971  47 y.o.  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  Ileal J-pouch.  Diverting loop ileostomy.  Recovered.  Underwent ileostomy takedown.  Had poorly controlled pouch function.  Stricture dilated.  Aggressive antidiarrheal regimen.  Pelvic floor physical therapy.  Desired return to ileostomy only.  Underwent resection of pouch and creation of permanent ileostomy in May.  Recovered relatively well.  Went back to smoking.  Had worsening crampy abdominal pain and came to emergency room.  Found to have lobulated fluid collection in the small intestine left lower quadrant correlating with his pain.  Suspicious for abscess.  No good window for percutaneous drainage.  Admitted.  Placed on IV antibiotics.  Improved.  Pain  markedly went down.  Bowel function returned.  No more nausea vomiting.  White count normalized.  Based on improvements, I felt it was reasonable to be discharged home and transition to oral antibiotics with close follow-up.  He agreed.   Based on meeting discharge criteria and continuing to recover, I felt it was safe for the patient to be discharged from the hospital to further recover with close followup. Postoperative recommendations were discussed in detail.  They are written as well.  Discharged Condition: good  Disposition:  Follow-up Information    Michael Boston, MD. Schedule an appointment as soon as possible for a visit in 2 week(s).   Specialty:  General Surgery Contact information: [place] [address] [phone]           Discharge disposition: 01-Home or Self Care       Discharge Instructions    Call MD for:   Complete by:  As directed    FEVER > 101.5 F  (temperatures < 101.5 F are not significant)   Call MD for:  extreme fatigue   Complete by:  As directed    Call MD for:  persistant dizziness or light-headedness   Complete by:  As directed    Call MD for:  persistant nausea and vomiting   Complete by:  As directed    Call MD for:  redness, tenderness, or signs of infection (pain, swelling, redness, odor or green/yellow discharge around incision site)   Complete by:  As directed    Call MD for:  severe uncontrolled pain   Complete by:  As  directed    Diet - low sodium heart healthy   Complete by:  As directed    Start with a bland diet such as soups, liquids, starchy foods, low fat foods, etc. the first few days at home. Gradually advance to a solid, low-fat, high fiber diet by the end of the first week at home.   Add a fiber supplement to your diet (Metamucil, etc) If you feel full, bloated, or constipated, stay on a full liquid or pureed/blenderized diet for a few days until you feel better and are no longer constipated.    Discharge instructions   Complete by:  As directed    See Discharge Instructions If you are not getting better after two weeks or are noticing you are getting worse, contact our office [phone] for further advice.  We may need to adjust your medications, re-evaluate you in the office, send you to the emergency room, or see what other things we can do to help. The clinic staff is available to answer your questions during regular business hours (8:30am-5pm).  Please don't hesitate to call and ask to speak to one of our nurses for clinical concerns.    A surgeon from [facility] is always on call at the hospitals 24 hours/day If you have a medical emergency, go to the nearest emergency room or call 911.   Discharge wound care:   Complete by:  As directed    It is good for closed incisions and even open wounds to be washed every day.  Shower every day.  Short baths are fine.  Wash the incisions and wounds clean with soap & water.     You may leave closed incisions open to air if it is dry.   You may cover the incision with clean gauze & replace it after your daily shower for comfort.   If you have skin tapes (Steristrips) or skin glue (Dermabond) on your incision, leave them in place.  They will fall off on their own like a scab.  You may trim any edges that curl up with clean scissors.    If you have skin staples, set up an appointment for them to be removed in the office in 10 days after surgery.  If you have a drain, wash around the skin exit site with soap & water and place a new dressing of gauze or band aid around the skin every day.  Keep the drain site clean & dry.   Driving Restrictions   Complete by:  As directed    You may drive when: - you are no longer taking narcotic prescription pain medication - you can comfortably wear a seatbelt - you can safely make sudden turns/stops without pain.   Increase activity slowly   Complete by:  As directed    Start light daily  activities --- self-care, walking, climbing stairs- beginning the day after surgery.  Gradually increase activities as tolerated.  Control your pain to be active.  Stop when you are tired.  Ideally, walk several times a day, eventually an hour a day.   Most people are back to most day-to-day activities in a few weeks.  It takes 4-6 weeks to get back to unrestricted, intense activity. If you can walk 30 minutes without difficulty, it is safe to try more intense activity such as jogging, treadmill, bicycling, low-impact aerobics, swimming, etc. Save the most intensive and strenuous activity for last (Usually 4-8 weeks after surgery) such as sit-ups, heavy lifting,  contact sports, etc.  Refrain from any intense heavy lifting or straining until you are off narcotics for pain control.  You will have off days, but things should improve week-by-week. DO NOT PUSH THROUGH PAIN.  Let pain be your guide: If it hurts to do something, don't do it.   Lifting restrictions   Complete by:  As directed    If you can walk 30 minutes without difficulty, it is safe to try more intense activity such as jogging, treadmill, bicycling, low-impact aerobics, swimming, etc. Save the most intensive and strenuous activity for last (Usually 4-8 weeks after surgery) such as sit-ups, heavy lifting, contact sports, etc.   Refrain from any intense heavy lifting or straining until you are off narcotics for pain control.  You will have off days, but things should improve week-by-week. DO NOT PUSH THROUGH PAIN.  Let pain be your guide: If it hurts to do something, don't do it.  Pain is your body warning you to avoid that activity for another week until the pain goes down.   May shower / Bathe   Complete by:  As directed    May walk up steps   Complete by:  As directed    Sexual Activity Restrictions   Complete by:  As directed    You may have sexual intercourse when it is comfortable. If it hurts to do something, stop.       Allergies as of 07/28/2018   No Known Allergies     Medication List    TAKE these medications   amoxicillin-clavulanate 875-125 MG tablet Commonly known as:  AUGMENTIN Take 1 tablet by mouth 2 (two) times daily.   Folding Walking [employer] 1 Units by Does not apply route daily.   gabapentin 300 MG capsule Commonly known as:  NEURONTIN Take 1 capsule (300 mg total) by mouth 3 (three) times daily.   methocarbamol 500 MG tablet Commonly known as:  ROBAXIN Take 1 tablet (500 mg total) by mouth every 6 (six) hours as needed for muscle spasms.   oxyCODONE 5 MG immediate release tablet Commonly known as:  Oxy IR/ROXICODONE Take 1-2 tablets (5-10 mg total) by mouth every 6 (six) hours as needed for moderate pain.   sertraline 50 MG tablet Commonly known as:  ZOLOFT Take 1 tablet (50 mg total) by mouth daily.            Discharge Care Instructions  (From admission, onward)         Start     Ordered   07/28/18 0000  Discharge wound care:    Comments:  It is good for closed incisions and even open wounds to be washed every day.  Shower every day.  Short baths are fine.  Wash the incisions and wounds clean with soap & water.     You may leave closed incisions open to air if it is dry.   You may cover the incision with clean gauze & replace it after your daily shower for comfort.   If you have skin tapes (Steristrips) or skin glue (Dermabond) on your incision, leave them in place.  They will fall off on their own like a scab.  You may trim any edges that curl up with clean scissors.    If you have skin staples, set up an appointment for them to be removed in the office in 10 days after surgery.  If you have a drain, wash around the skin exit site with soap & water  and place a new dressing of gauze or band aid around the skin every day.  Keep the drain site clean & dry.   07/28/18 0706          Significant Diagnostic Studies:  Results for orders placed or performed  during the hospital encounter of 07/25/18 (from the past 72 hour(s))  CBC     Status: Abnormal   Collection Time: 07/25/18  3:52 PM  Result Value Ref Range   WBC 11.7 (H) 4.0 - 10.5 K/uL   RBC 5.06 4.22 - 5.81 MIL/uL   Hemoglobin 14.0 13.0 - 17.0 g/dL   HCT 43.3 39.0 - 52.0 %   MCV 85.6 78.0 - 100.0 fL   MCH 27.7 26.0 - 34.0 pg   MCHC 32.3 30.0 - 36.0 g/dL   RDW 14.7 11.5 - 15.5 %   Platelets 469 (H) 150 - 400 K/uL    Comment: Performed at [facility] [address], [place], [place] [address]  Comprehensive metabolic panel     Status: Abnormal   Collection Time: 07/25/18  3:52 PM  Result Value Ref Range   Sodium 136 135 - 145 mmol/L   Potassium 3.9 3.5 - 5.1 mmol/L   Chloride 100 98 - 111 mmol/L   CO2 24 22 - 32 mmol/L   Glucose, Bld 83 70 - 99 mg/dL   BUN 10 6 - 20 mg/dL   Creatinine, Ser 0.90 0.61 - 1.24 mg/dL   Calcium 8.7 (L) 8.9 - 10.3 mg/dL   Total Protein 6.2 (L) 6.5 - 8.1 g/dL   Albumin 2.1 (L) 3.5 - 5.0 g/dL   AST 19 15 - 41 U/L   ALT 21 0 - 44 U/L   Alkaline Phosphatase 169 (H) 38 - 126 U/L   Total Bilirubin 1.1 0.3 - 1.2 mg/dL   GFR calc non Af Amer >60 >60 mL/min   GFR calc Af Amer >60 >60 mL/min    Comment: (NOTE) The eGFR has been calculated using the CKD EPI equation. This calculation has not been validated in all clinical situations. eGFR's persistently <60 mL/min signify possible Chronic Kidney Disease.    Anion gap 12 5 - 15    Comment: Performed at [facility] [address], [place], [place] [address]  Lipase, blood     Status: None   Collection Time: 07/25/18  3:52 PM  Result Value Ref Range   Lipase 25 11 - 51 U/L    Comment: Performed at [facility] [address] [place], [place] [address]  Urinalysis, Routine w reflex microscopic- may I&O cath if menses     Status: Abnormal   Collection Time: 07/25/18  5:05 PM  Result Value Ref Range   Color, Urine AMBER (A) YELLOW    Comment: BIOCHEMICALS MAY BE AFFECTED BY COLOR    APPearance CLEAR CLEAR   Specific Gravity, Urine 1.025 1.005 - 1.030   pH 6.0 5.0 - 8.0   Glucose, UA NEGATIVE NEGATIVE mg/dL   Hgb urine dipstick NEGATIVE NEGATIVE   Bilirubin Urine NEGATIVE NEGATIVE   Ketones, ur 20 (A) NEGATIVE mg/dL   Protein, ur NEGATIVE NEGATIVE mg/dL   Nitrite NEGATIVE NEGATIVE   Leukocytes, UA NEGATIVE NEGATIVE    Comment: Performed at [facility] [address] [place], [place] [address]  CBG monitoring, ED     Status: Abnormal   Collection Time: 07/25/18  6:03 PM  Result Value Ref Range   Glucose-Capillary 60 (L)  70 - 99 mg/dL  CK     Status: Abnormal   Collection Time: 07/25/18  6:26 PM  Result Value Ref Range   Total CK 16 (L) 49 - 397 U/L    Comment: Performed at [facility], [place] [address], [place], [place] [address]  Urine culture     Status: Abnormal   Collection Time: 07/25/18  9:27 PM  Result Value Ref Range   Specimen Description URINE, RANDOM    Special Requests NONE    Culture (A)     <10,000 COLONIES/mL INSIGNIFICANT GROWTH Performed at [facility] [address] [place], [place] [address]    Report Status 07/27/2018 FINAL   CBC     Status: Abnormal   Collection Time: 07/28/18  4:48 AM  Result Value Ref Range   WBC 8.2 4.0 - 10.5 K/uL   RBC 4.21 (L) 4.22 - 5.81 MIL/uL   Hemoglobin 11.7 (L) 13.0 - 17.0 g/dL   HCT 35.6 (L) 39.0 - 52.0 %   MCV 84.6 78.0 - 100.0 fL   MCH 27.8 26.0 - 34.0 pg   MCHC 32.9 30.0 - 36.0 g/dL   RDW 15.0 11.5 - 15.5 %   Platelets 433 (H) 150 - 400 K/uL    Comment: Performed at [facility], [place] [address], [place], [place] [address]  Magnesium     Status: None   Collection Time: 07/28/18  4:48 AM  Result Value Ref Range   Magnesium 2.0 1.7 - 2.4 mg/dL    Comment: Performed at [facility], [place] [address], [place], [place] [address]  Creatinine, serum     Status: None   Collection Time: 07/28/18  4:48 AM  Result Value Ref Range   Creatinine, Ser  0.87 0.61 - 1.24 mg/dL   GFR calc non Af Amer >60 >60 mL/min   GFR calc Af Amer >60 >60 mL/min    Comment: (NOTE) The eGFR has been calculated using the CKD EPI equation. This calculation has not been validated in all clinical situations. eGFR's persistently <60 mL/min signify possible Chronic Kidney Disease. Performed at [facility], [place] [address], [place], [place] [address]   Potassium     Status: None   Collection Time: 07/28/18  4:48 AM  Result Value Ref Range   Potassium 3.5 3.5 - 5.1 mmol/L    Comment: Performed at [facility], [place] [address], [place], [place] [address]    Ct Abdomen Pelvis W Contrast  Addendum Date: 07/26/2018   ADDENDUM REPORT: 07/26/2018 17:34 ADDENDUM: Correction: In the impression it should state: Total proctocolectomy with small bowel ostomy in the right upper quadrant. Chain sutures are redemonstrated in the region of the anus. Electronically Signed   By: Ashley Royalty M.D.   On: 07/26/2018 17:34   Result Date: 07/26/2018 CLINICAL DATA:  Left flank and lower quadrant pain x3 days. History of rectal cancer. EXAM: CT ABDOMEN AND PELVIS WITH CONTRAST TECHNIQUE: Multidetector CT imaging of the abdomen and pelvis was performed using the standard protocol following bolus administration of intravenous contrast. CONTRAST:  166m OMNIPAQUE IOHEXOL 300 MG/ML  SOLN COMPARISON:  12/06/2017 FINDINGS: Lower chest: Normal heart size without pericardial effusion. Emphysematous disease at the lung bases. No effusion or dominant mass. No pneumothorax. Hepatobiliary: Homogeneous attenuation of the liver. Tiny layering gallstones within a physiologically distended gallbladder. No secondary signs of acute cholecystitis. No biliary dilatation. Pancreas: Normal Spleen: Normal size spleen with adjacent splenule.  Adrenals/Urinary Tract: Normal bilateral adrenal glands. No enhancing renal mass nor obstructive uropathy. The urinary bladder is decompressed  which may explain the circumferential mild mural thickening noted. Cystitis is not entirely excluded. Stomach/Bowel: The stomach is nondistended. The duodenal sweep is unremarkable. The patient is status post total proctocolectomy with right upper quadrant ostomy site noted and ileo anal anastomosis present. Within the left lower quadrant is a lobulated enhancing fluid collection with largest component measuring approximately 4.7 x 3.2 x 2.8 cm concerning for an interloop abscess, trefoil in appearance on series 3/54 with diffuse moderate inflammatory thickening of adjacent small bowel loops. Given lack of bowel signature noted within this collection, dilated enhancing loops of small bowel are believed unlikely as are pseudosacculations as would be seen in inflammatory bowel disease. Likewise, intramural abscesses are believed less likely as it appears to demonstrate positive mass effect on adjacent bowel loops causing some displacement. No bowel obstruction or free air. Vascular/Lymphatic: Patent nonaneurysmal abdominal aorta and branch vessels. No inguinal, pelvic sidewall nor mesenteric lymphadenopathy. Reproductive: Prostate is unremarkable. Other: Free air. Musculoskeletal: No aggressive osseous lesions. IMPRESSION: Findings concerning for an interloop abscess in the left lower quadrant of the abdomen, trefoil in appearance on series 3/54 with largest component measuring 4.7 x 3.2 x 2.8 cm. Surrounding small bowel inflammation is noted without obstruction or free air. Redemonstration of ileoanal anastomosis, total proctocolitis and right sided ostomy. COPD of the lung bases. Uncomplicated cholelithiasis. Electronically Signed: By: Ashley Royalty M.D. On: 07/25/2018 18:27    Discharge Exam: Blood pressure 104/81, pulse 73, temperature 98.2 F (36.8 C), temperature source Oral, resp. rate 14, height 6' (1.829 m), weight 63.9 kg, SpO2 96 %.  General: Pt awake/alert/oriented x4 in No acute distress Eyes:  PERRL, normal EOM.  Sclera clear.  No icterus Neuro: CN II-XII intact w/o focal sensory/motor deficits. Lymph: No head/neck/groin lymphadenopathy Psych:  No delerium/psychosis/paranoia HENT: Normocephalic, Mucus membranes moist.  No thrush Neck: Supple, No tracheal deviation Chest: No chest wall pain w good excursion CV:  Pulses intact.  Regular rhythm MS: Normal AROM mjr joints.  No obvious deformity Abdomen: Soft.  Nondistended.  Nontender.  No evidence of peritonitis.  No incarcerated hernias.  Right lower quadrant ileostomy pink with gas and succus in bag. Ext:  SCDs BLE.  No mjr edema.  No cyanosis Skin: No petechiae / purpura  Past Medical History:  Diagnosis Date  . Anxiety   . C. difficile diarrhea 03/30/2018  . Colon cancer ([place]) 12/01/2016  . Depression   . Family history of colon cancer   . 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 In families with MSH6 mutations, screening can start at age 90 years since the onset of colon cancer is later in these families.  Annual screening for endome  . Poor dental hygiene   . Rectal adenocarcinoma s/p protctocolectomy, IPAA "J" pouch 12/01/2016 08/06/2016  . 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 COLON SURGERY    . EUS N/A 07/29/2016   Procedure: LOWER ENDOSCOPIC ULTRASOUND (EUS);  Surgeon: Milus Banister, MD;  Location: [facility];  Service: Endoscopy;  Laterality:  N/A;  . ILEOSTOMY CLOSURE N/A 09/22/2017   Procedure: TAKEDOWN OF LOOP ILEOSTOMY;  Surgeon: Michael Boston, MD;  Location: [facility];  Service: General;  Laterality: N/A;  .  LYSIS OF ADHESION N/A 05/10/2018   Procedure: LYSIS OF ADHESION;  Surgeon: Michael Boston, MD;  Location: [facility];  Service: General;  Laterality: N/A;  . PORTACATH PLACEMENT N/A 01/26/2017   Procedure: PLACEMENT OF PORT-A-CATH CENTRAL LINE WITH FLUOROSCOPY AND ULTRASOUND;  Surgeon: Michael Boston, MD;  Location: [facility];  Service: General;  Laterality: N/A;  . POUCHOSCOPY N/A 09/21/2017   Procedure: POUCHOSCOPY;  Surgeon: Leighton Ruff, MD;  Location: [facility];  Service: Endoscopy;  Laterality: N/A;  . POUCHOSCOPY N/A 01/12/2018   Procedure: POUCHOSCOPY WITH BIOPSIES;  Surgeon: Leighton Ruff, MD;  Location: [facility];  Service: Endoscopy;  Laterality: N/A;  Local  . PROCTOSCOPY N/A 12/01/2016   Procedure: RIGID PROCTOSCOPY;  Surgeon: Michael Boston, MD;  Location: [facility];  Service: General;  Laterality: N/A;  . RECTAL EXAM UNDER ANESTHESIA N/A 05/10/2018   Procedure: RECTAL EXAM UNDER ANESTHESIA;  Surgeon: Michael Boston, MD;  Location: [facility];  Service: General;  Laterality: N/A;  . TOE AMPUTATION Left   . XI ROBOTIC ASSISTED LOWER ANTERIOR RESECTION N/A 12/01/2016   Procedure: XI ROBOTIC ASSISTED PROCTOCOLECTOMY WITH ILLEOPOUCH ANASTAMOSIS WITH DIVERTING ILLEOSTOMY;  Surgeon: Michael Boston, MD;  Location: [facility];  Service: General;  Laterality: N/A;  . XI ROBOTIC ASSISTED LOWER ANTERIOR RESECTION N/A 05/10/2018   Procedure: XI ROBOTIC RESECTION OF ILEAL J POUCH, LYSIS OF ADHESIONS, WITH CREATION OF PERMANENT END ILEOSTOMY.;  Surgeon: Michael Boston, MD;  Location: [facility];  Service: General;  Laterality: N/A;    Social History   Socioeconomic History  . Marital status: Divorced    Spouse name: Not on file  . Number of children: 3  . Years of education: Not on file  . Highest education level: Not on file  Occupational History  . Not on file  Social Needs  . Financial resource strain: Not on file  . Food insecurity:    Worry: Not on file    Inability: Not on file  .  Transportation needs:    Medical: Not on file    Non-medical: Not on file  Tobacco Use  . Smoking status: Current Every Day Smoker    Packs/day: 1.00    Years: 32.00    Pack years: 32.00    Types: Cigarettes  . Smokeless tobacco: Never Used  Substance and Sexual Activity  . Alcohol use: No  . Drug use: No    Comment: patient denies  . Sexual activity: Yes  Lifestyle  . Physical activity:    Days per week: Not on file    Minutes per session: Not on file  . Stress: Not on file  Relationships  . Social connections:    Talks on phone: Not on file    Gets together: Not on file    Attends religious service: Not on file    Active member of club or organization: Not on file    Attends meetings of clubs or organizations: Not on file    Relationship status: Not on file  . Intimate partner violence:    Fear of current or ex partner: Not on file    Emotionally abused: Not on file    Physically abused: Not on file    Forced sexual activity: Not on file  Other Topics Concern  . Not on file  Social History  Narrative   Lives with significant other, [name]   Have a 79 year old son   Smoker      [place]    Family History  Problem Relation Age of Onset  . Diabetes Mother   . COPD Father   . Colon cancer Maternal Aunt        dx in her 28s  . Diabetes Maternal Grandmother   . Brain cancer Maternal Grandfather   . Lung cancer Paternal Grandfather   . Colon cancer Cousin        dx in her 9s  . Bone cancer Sister 8  . Pancreatic cancer Neg Hx   . Esophageal cancer Neg Hx   . Stomach cancer Neg Hx   . Liver disease Neg Hx     Current Facility-Administered Medications  Medication Dose Route Frequency Provider Last Rate Last Dose  . 0.9 %  sodium chloride infusion   Intravenous PRN Clovis Riley, MD 10 mL/hr at 07/28/18 [phone]    . 0.9 %  sodium chloride infusion  250 mL Intravenous PRN Michael Boston, MD      . acetaminophen (TYLENOL) tablet 1,000  mg  1,000 mg Oral Q6H Romana Juniper A, MD   1,000 mg at 07/28/18 0510  . alum & mag hydroxide-simeth (MAALOX/MYLANTA) 200-200-20 MG/5ML suspension 30 mL  30 mL Oral Q6H PRN Michael Boston, MD      . diphenhydrAMINE (BENADRYL) capsule 25 mg  25 mg Oral Q6H PRN Romana Juniper A, MD   25 mg at 07/26/18 2136   Or  . diphenhydrAMINE (BENADRYL) injection 25 mg  25 mg Intravenous Q6H PRN Romana Juniper A, MD      . famotidine (PEPCID) tablet 20 mg  20 mg Oral BID Adrian Saran, RPH   20 mg at 07/27/18 2133  . feeding supplement (ENSURE SURGERY) liquid 237 mL  237 mL Oral BID BM Michael Boston, MD   237 mL at 07/27/18 1431  . gabapentin (NEURONTIN) capsule 300 mg  300 mg Oral TID Romana Juniper A, MD   300 mg at 07/27/18 2133  . guaiFENesin-dextromethorphan (ROBITUSSIN DM) 100-10 MG/5ML syrup 10 mL  10 mL Oral Q4H PRN Michael Boston, MD      . heparin injection 5,000 Units  5,000 Units Subcutaneous Q8H Clovis Riley, MD   5,000 Units at 07/28/18 [phone]  . hydrALAZINE (APRESOLINE) injection 10 mg  10 mg Intravenous Q2H PRN Romana Juniper A, MD      . hydrocortisone (ANUSOL-HC) 2.5 % rectal cream 1 application  1 application Topical QID PRN Michael Boston, MD      . hydrocortisone cream 1 % 1 application  1 application Topical TID PRN Michael Boston, MD      . HYDROmorphone (DILAUDID) injection 0.5-1 mg  0.5-1 mg Intravenous Q2H PRN Clovis Riley, MD   1 mg at 07/26/18 1918  . ibuprofen (ADVIL,MOTRIN) tablet 600 mg  600 mg Oral Q6H PRN Romana Juniper A, MD   600 mg at 07/28/18 0124  . lactated ringers bolus 1,000 mL  1,000 mL Intravenous TID PRN Michael Boston, MD      . lactated ringers bolus 1,000 mL  1,000 mL Intravenous TID PRN Michael Boston, MD      . lip balm (CARMEX) ointment 1 application  1 application Topical BID Michael Boston, MD   1 application at 75/44/92 2133  . magic mouthwash  15 mL Oral QID PRN Michael Boston, MD      .  menthol-cetylpyridinium (CEPACOL) lozenge 3 mg  1 lozenge Oral  PRN Michael Boston, MD      . methocarbamol (ROBAXIN) tablet 500 mg  500 mg Oral Q6H PRN Romana Juniper A, MD   500 mg at 07/28/18 0124  . metoprolol tartrate (LOPRESSOR) injection 5 mg  5 mg Intravenous Q6H PRN Romana Juniper A, MD      . nicotine (NICODERM CQ - dosed in mg/24 hours) patch 14 mg  14 mg Transdermal Daily Michael Boston, MD   14 mg at 07/27/18 0902  . ondansetron (ZOFRAN-ODT) disintegrating tablet 4 mg  4 mg Oral Q6H PRN Clovis Riley, MD       Or  . ondansetron [facility]) injection 4 mg  4 mg Intravenous Q6H PRN Clovis Riley, MD   4 mg at 07/25/18 2117  . oxyCODONE (Oxy IR/ROXICODONE) immediate release tablet 5-10 mg  5-10 mg Oral Q4H PRN Clovis Riley, MD   10 mg at 07/27/18 2023  . phenol (CHLORASEPTIC) mouth spray 1-2 spray  1-2 spray Mouth/Throat PRN Michael Boston, MD      . piperacillin-tazobactam (ZOSYN) IVPB 3.375 g  3.375 g Intravenous Q8H Clovis Riley, MD   Stopped at 07/28/18 [phone]  . prochlorperazine (COMPAZINE) tablet 10 mg  10 mg Oral Q6H PRN Romana Juniper A, MD       Or  . prochlorperazine (COMPAZINE) injection 5-10 mg  5-10 mg Intravenous Q6H PRN Romana Juniper A, MD      . sertraline (ZOLOFT) tablet 50 mg  50 mg Oral Daily Romana Juniper A, MD   50 mg at 07/27/18 0902  . sodium chloride flush (NS) 0.9 % injection 3 mL  3 mL Intravenous Gorden Harms, MD   3 mL at 07/27/18 2134  . sodium chloride flush (NS) 0.9 % injection 3 mL  3 mL Intravenous PRN Michael Boston, MD       Facility-Administered Medications Ordered in Other Encounters  Medication Dose Route Frequency Provider Last Rate Last Dose  . sodium chloride flush (NS) 0.9 % injection 10 mL  10 mL Intravenous PRN Truitt Merle, MD   10 mL at 11/18/17 0953     No Known Allergies  Signed: Morton Peters, MD, FACS, MASCRS Gastrointestinal and Minimally Invasive Surgery    [address] [place], [place] [address] [phone] Main / Paging [phone] Fax   07/28/2018, 7:06 AM

## 2018-07-31 ENCOUNTER — Other Ambulatory Visit: Payer: Self-pay

## 2018-07-31 ENCOUNTER — Encounter (HOSPITAL_COMMUNITY): Payer: Self-pay

## 2018-07-31 ENCOUNTER — Emergency Department (HOSPITAL_COMMUNITY)
Admission: EM | Admit: 2018-07-31 | Discharge: 2018-07-31 | Disposition: A | Discharge status: Home / Self Care | Payer: Medicaid Other | Attending: Emergency Medicine | Admitting: Emergency Medicine

## 2018-07-31 ENCOUNTER — Emergency Department (HOSPITAL_COMMUNITY): Payer: Medicaid Other

## 2018-07-31 DIAGNOSIS — Z85038 Personal history of other malignant neoplasm of large intestine: Secondary | ICD-10-CM | POA: Diagnosis not present

## 2018-07-31 DIAGNOSIS — Z79899 Other long term (current) drug therapy: Secondary | ICD-10-CM | POA: Diagnosis not present

## 2018-07-31 DIAGNOSIS — R1032 Left lower quadrant pain: Secondary | ICD-10-CM | POA: Diagnosis present

## 2018-07-31 DIAGNOSIS — L02211 Cutaneous abscess of abdominal wall: Secondary | ICD-10-CM | POA: Diagnosis not present

## 2018-07-31 DIAGNOSIS — F17210 Nicotine dependence, cigarettes, uncomplicated: Secondary | ICD-10-CM | POA: Diagnosis not present

## 2018-07-31 DIAGNOSIS — K651 Peritoneal abscess: Secondary | ICD-10-CM | POA: Insufficient documentation

## 2018-07-31 DIAGNOSIS — R109 Unspecified abdominal pain: Secondary | ICD-10-CM | POA: Diagnosis not present

## 2018-07-31 LAB — CBC
HCT: 40.4 % (ref 39.0–52.0)
HEMOGLOBIN: 13.2 g/dL (ref 13.0–17.0)
MCH: 28.1 pg (ref 26.0–34.0)
MCHC: 32.7 g/dL (ref 30.0–36.0)
MCV: 86.1 fL (ref 78.0–100.0)
Platelets: 587 10*3/uL — ABNORMAL HIGH (ref 150–400)
RBC: 4.69 MIL/uL (ref 4.22–5.81)
RDW: 15.6 % — AB (ref 11.5–15.5)
WBC: 9.8 10*3/uL (ref 4.0–10.5)

## 2018-07-31 LAB — COMPREHENSIVE METABOLIC PANEL
ALBUMIN: 2.4 g/dL — AB (ref 3.5–5.0)
ALK PHOS: 140 U/L — AB (ref 38–126)
ALT: 14 U/L (ref 0–44)
ANION GAP: 8 (ref 5–15)
AST: 18 U/L (ref 15–41)
BILIRUBIN TOTAL: 0.5 mg/dL (ref 0.3–1.2)
CALCIUM: 8.5 mg/dL — AB (ref 8.9–10.3)
CO2: 25 mmol/L (ref 22–32)
CREATININE: 0.83 mg/dL (ref 0.61–1.24)
Chloride: 108 mmol/L (ref 98–111)
GFR calc Af Amer: >60 mL/min (ref >60)
GFR calc non Af Amer: >60 mL/min (ref >60)
GLUCOSE: 96 mg/dL (ref 70–99)
Potassium: 4 mmol/L (ref 3.5–5.1)
Sodium: 141 mmol/L (ref 135–145)
TOTAL PROTEIN: 6.3 g/dL — AB (ref 6.5–8.1)

## 2018-07-31 LAB — LIPASE, BLOOD: Lipase: 24 U/L (ref 11–51)

## 2018-07-31 MED ORDER — LACTATED RINGERS IV BOLUS
2000.0000 mL | Freq: Once | INTRAVENOUS | Status: AC
  Administered 2018-07-31: 2000 mL via INTRAVENOUS

## 2018-07-31 MED ORDER — ONDANSETRON 4 MG PO TBDP: 4.0000 mg | ORAL_TABLET | Freq: Three times a day (TID) | ORAL | 0 refills | Status: DC | PRN

## 2018-07-31 MED ORDER — HYDROMORPHONE HCL 1 MG/ML IJ SOLN
1.0000 mg | Freq: Once | INTRAMUSCULAR | Status: AC
  Administered 2018-07-31: 1 mg via INTRAVENOUS
  Filled 2018-07-31: qty 1

## 2018-07-31 MED ORDER — IOPAMIDOL (ISOVUE-300) INJECTION 61%
100.0000 mL | Freq: Once | INTRAVENOUS | Status: AC | PRN
Start: 2018-07-31 — End: 2018-07-31
  Administered 2018-07-31: 100 mL via INTRAVENOUS

## 2018-07-31 MED ORDER — IOPAMIDOL (ISOVUE-300) INJECTION 61%
INTRAVENOUS | Status: AC
  Filled 2018-07-31: qty 100

## 2018-07-31 MED ORDER — LIDOCAINE 5 % EX PTCH: 1.0000 | MEDICATED_PATCH | CUTANEOUS | 0 refills | Status: DC

## 2018-07-31 MED ORDER — ONDANSETRON HCL 4 MG/2ML IJ SOLN
4.0000 mg | Freq: Once | INTRAMUSCULAR | Status: AC
  Administered 2018-07-31: 4 mg via INTRAVENOUS
  Filled 2018-07-31: qty 2

## 2018-07-31 MED ORDER — OXYCODONE HCL 5 MG PO TABS
5.0000 mg | ORAL_TABLET | Freq: Four times a day (QID) | ORAL | 0 refills | Status: DC | PRN
Start: 2018-07-31 — End: 2018-09-15

## 2018-07-31 NOTE — ED Triage Notes (Signed)
Pt reports  Left sided abdominal pain that radiates to back and groin 10/10  Stabbing with nausea x 1 day Pt reports taking oxycodone and was not effective in pain relief. Pt reports that he was seen for abscess in his bowel walls and has a urostomy bag. Pt  Was referred by PCP dr. Johney Maine to returned to the ED.;

## 2018-07-31 NOTE — ED Notes (Signed)
Writer call for blood draws, X1 no response

## 2018-07-31 NOTE — ED Notes (Signed)
Patient transported to CT 

## 2018-07-31 NOTE — Discharge Instructions (Addendum)
Thank you for allowing me to care for you today in the Emergency Department.   Call Dr. Johney Maine tomorrow for follow-up.  For pain control at home, you can take 1 to 2 tablets of oxycodone every 4-6 hours.  Since you do not like the way the oxycodone makes you feel, there are some other medications that you can try to help get your pain under control.  Take 1000 mg of Tylenol every 8 hours for pain control and/or 800 mg of ibuprofen (with food) every 8 hours.  You can also apply 1 lidocaine patch every 24 hours directly to the skin.  Let 1 tablet of Zofran dissolve under your tongue every 8 hours as needed for nausea or vomiting.  Return to the emergency department if you develop a high fever, persistent vomiting, uncontrollable pain despite the regimen above, or other new, concerning symptoms.

## 2018-07-31 NOTE — ED Notes (Signed)
ED Provider at bedside. 

## 2018-07-31 NOTE — ED Provider Notes (Signed)
[facility] DEPT Provider Note   CSN: [id number] Arrival date & time: 07/31/18  1453     History   Chief Complaint Chief Complaint  Patient presents with  . Abdominal Pain    HPI [name] is a 47 y.o. 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  The patient was most recently admitted from 8/6-08/2018 for an interloop abscess in the left lower quadrant of the abdomen that was treated with Zosyn.  He was discharged home with a course of Augmentin.  His significant other reports severe, constant, left sided abdominal pain that radiates to the left back with chills, countless episodes of nausea, nonbloody, nonbilious emesis, and green-colored diarrhea.  No known aggravating or alleviating factors.  He has not measured his temperature at home.  He denies chest pain, dyspnea, headache, cough, weakness, or numbness.  He is treated his symptoms at home with oxycodone with no improvement in his pain.  He reports that he spoke with Dr. Johney Maine earlier today who advised him to return to the emergency department for reevaluation.  The history is provided by the patient. No language interpreter was used.   Past Medical History:  Diagnosis Date  . Anxiety   . C. difficile diarrhea 03/30/2018  . Colon cancer ([place]) 12/01/2016  . Depression   . Family history of colon cancer   . 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 10/13/2016   MSH6 pathogenic mutation  Lynch syndrome screening recs (from up to date)  Annual colonoscopy starting between the ages of 49 and 13 years, or 10 years prior to the earliest age of colon cancer diagnosis in the family  (whichever comes first). In families with MSH6 mutations, screening can start at age 31 years since the onset of colon cancer is later in these families.  Annual screening for endome  . Poor dental hygiene   . Rectal adenocarcinoma s/p protctocolectomy, IPAA "J" pouch 12/01/2016 08/06/2016  . 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 Dark urine 07/25/2018  . Intraabdominal abscess 07/25/2018  . Neuropathic pain of foot 05/28/2018  . Overflow incontinence s/p pouchectomy & ileostomy 04/2018 05/11/2018  . Ileostomy in place  05/10/2018  . Current mild episode of major depressive disorder without prior episode ([place]) 04/24/2018  . Situational syncope 03/30/2018  . Tobacco abuse 03/29/2018  . Muscle spasm 04/11/2017  . Chronic diarrhea s/p proctocolectomy & ileoanal J pouch 02/17/2017  . 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 10/13/2016  . Family history of colon cancer   . Anxiety state 09/14/2016  . 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 08/06/2016    Past Surgical History:  Procedure Laterality Date  . COLON SURGERY    . EUS N/A 07/29/2016   Procedure: LOWER ENDOSCOPIC ULTRASOUND (EUS);  Surgeon: Milus Banister, MD;  Location: [facility];  Service: Endoscopy;  Laterality: N/A;  . ILEOSTOMY CLOSURE N/A 09/22/2017   Procedure: TAKEDOWN OF LOOP ILEOSTOMY;  Surgeon: Michael Boston, MD;  Location: [facility];  Service: General;  Laterality: N/A;  . LYSIS OF ADHESION N/A 05/10/2018   Procedure: LYSIS OF ADHESION;  Surgeon: Michael Boston,  MD;  Location: [facility];  Service: General;  Laterality: N/A;  . PORTACATH PLACEMENT N/A 01/26/2017   Procedure: PLACEMENT OF PORT-A-CATH CENTRAL LINE WITH FLUOROSCOPY AND ULTRASOUND;  Surgeon: Michael Boston, MD;  Location: [facility];  Service: General;  Laterality: N/A;  .  POUCHOSCOPY N/A 09/21/2017   Procedure: POUCHOSCOPY;  Surgeon: [facility] Ruff, MD;  Location: [facility];  Service: Endoscopy;  Laterality: N/A;  . POUCHOSCOPY N/A 01/12/2018   Procedure: POUCHOSCOPY WITH BIOPSIES;  Surgeon: [facility] Ruff, MD;  Location: [facility];  Service: Endoscopy;  Laterality: N/A;  Local  . PROCTOSCOPY N/A 12/01/2016   Procedure: RIGID PROCTOSCOPY;  Surgeon: Michael Boston, MD;  Location: [facility];  Service: General;  Laterality: N/A;  . RECTAL EXAM UNDER ANESTHESIA N/A 05/10/2018   Procedure: RECTAL EXAM UNDER ANESTHESIA;  Surgeon: Michael Boston, MD;  Location: [facility];  Service: General;  Laterality: N/A;  . TOE AMPUTATION Left   . XI ROBOTIC ASSISTED LOWER ANTERIOR RESECTION N/A 12/01/2016   Procedure: XI ROBOTIC ASSISTED PROCTOCOLECTOMY WITH ILLEOPOUCH ANASTAMOSIS WITH DIVERTING ILLEOSTOMY;  Surgeon: Michael Boston, MD;  Location: [facility];  Service: General;  Laterality: N/A;  . XI ROBOTIC ASSISTED LOWER ANTERIOR RESECTION N/A 05/10/2018   Procedure: XI ROBOTIC RESECTION OF ILEAL J POUCH, LYSIS OF ADHESIONS, WITH CREATION OF PERMANENT END ILEOSTOMY.;  Surgeon: Michael Boston, MD;  Location: [facility];  Service: General;  Laterality: N/A;        Home Medications    Prior to Admission medications   Medication Sig Start Date End Date Taking? Authorizing Provider  amoxicillin-clavulanate (AUGMENTIN) 875-125 MG tablet Take 1 tablet by mouth 2 (two) times daily. 07/28/18  Yes Michael Boston, MD  Famotidine (PEPCID AC PO) Take 1 tablet by mouth daily as needed (reflux).   Yes [provider]  gabapentin (NEURONTIN) 300 MG capsule Take 1 capsule (300 mg total) by mouth 3 (three) times daily. 05/24/18  Yes Lockamy, Timothy, DO  ibuprofen (ADVIL,MOTRIN) 200 MG tablet Take 400 mg by mouth every 6 (six) hours as needed (pain.).   Yes [provider]  methocarbamol (ROBAXIN) 500 MG tablet Take 1 tablet (500 mg total) by mouth every 6 (six) hours as needed for muscle spasms.  07/28/18  Liston Alba, MD  Misc. Devices (FOLDING WALKING CANE) MISC 1 Units by Does not apply route daily. 06/29/18  Yes Lockamy, Timothy, DO  sertraline (ZOLOFT) 50 MG tablet Take 1 tablet (50 mg total) by mouth daily. 06/19/18  Yes Lockamy, Timothy, DO  lidocaine (LIDODERM) 5 % Place 1 patch onto the skin daily. Remove & Discard patch within 12 hours or as directed by MD 07/31/18   Krishna Heuer A, PA-C  ondansetron (ZOFRAN ODT) 4 MG disintegrating tablet Take 1 tablet (4 mg total) by mouth every 8 (eight) hours as needed for nausea or vomiting. 07/31/18   Iziah Cates A, PA-C  oxyCODONE (OXY IR/ROXICODONE) 5 MG immediate release tablet Take 1-2 tablets (5-10 mg total) by mouth every 6 (six) hours as needed for moderate pain. 07/31/18   Latifa Noble A, PA-C    Family History Family History  Problem Relation Age of Onset  . Diabetes Mother   . COPD Father   . Colon cancer Maternal Aunt        dx in her 20s  . Diabetes Maternal Grandmother   . Brain cancer Maternal Grandfather   . Lung cancer Paternal Grandfather   . Colon cancer Cousin        dx  in her 23s  . Bone cancer Sister 8  . Pancreatic cancer Neg Hx   . Esophageal cancer Neg Hx   . Stomach cancer Neg Hx   . Liver disease Neg Hx     Social History Social History   Tobacco Use  . Smoking status: Current Every Day Smoker    Packs/day: 1.00    Years: 32.00    Pack years: 32.00    Types: Cigarettes  . Smokeless tobacco: Never Used  Substance Use Topics  . Alcohol use: No  . Drug use: No    Comment: patient denies     Allergies   Patient has no known allergies.   Review of Systems Review of Systems  Constitutional: Positive for chills. Negative for appetite change and fever.  Respiratory: Negative for shortness of breath.   Cardiovascular: Negative for chest pain.  Gastrointestinal: Positive for abdominal pain, diarrhea, nausea and vomiting.  Genitourinary: Negative for dysuria, frequency, hematuria and  testicular pain.  Musculoskeletal: Positive for back pain.  Skin: Negative for rash.  Allergic/Immunologic: Negative for immunocompromised state.  Neurological: Negative for headaches.  Psychiatric/Behavioral: Negative for confusion.     Physical Exam Updated Vital Signs BP (!) 132/91   Pulse (!) 53   Temp 98.4 F (36.9 C) (Oral)   Resp 17   Ht _0  (1.854 m)   Wt 62.6 kg   SpO2 99%   BMI 18.21 kg/m   Physical Exam  Constitutional: He appears well-developed.  Chronically ill-appearing middle-aged male who appears uncomfortable.  HENT:  Head: Normocephalic.  Eyes: Conjunctivae are normal. No scleral icterus.  Neck: Neck supple.  Cardiovascular: Normal rate, regular rhythm, normal heart sounds and intact distal pulses. Exam reveals no gallop and no friction rub.  No murmur heard. Pulmonary/Chest: Effort normal. No respiratory distress.  Abdominal: Soft. He exhibits no distension and no mass. There is tenderness. There is no rebound and no guarding. No hernia.    Abdomen is soft, minimally distended.  Diffusely tender with minimal palpation throughout the left side of the abdomen and left flank and left mid and lower back.  Right abdomen is non-tender.   Musculoskeletal: He exhibits no tenderness.       Arms: Neurological: He is alert.  Skin: Skin is warm and dry.  Psychiatric: His behavior is normal.  Nursing note and vitals reviewed.    ED Treatments / Results  Labs (all labs ordered are listed, but only abnormal results are displayed) Labs Reviewed  COMPREHENSIVE METABOLIC PANEL - Abnormal; Notable for the following components:      Result Value   BUN <5 (*)    Calcium 8.5 (*)    Total Protein 6.3 (*)    Albumin 2.4 (*)    Alkaline Phosphatase 140 (*)    All other components within normal limits  CBC - Abnormal; Notable for the following components:   RDW 15.6 (*)    Platelets 587 (*)    All other components within normal limits  LIPASE, BLOOD    URINALYSIS, ROUTINE W REFLEX MICROSCOPIC    EKG None  Radiology Ct Abdomen Pelvis W Contrast  Result Date: 07/31/2018 CLINICAL DATA:  47 year old with Lynch syndrome who developed a rectal cancer and underwent total proctocolectomy in December, 2017 and who underwent resection of the ileal pouch, lysis of adhesions and creation of end ileostomy on 05/10/2018. Recent diagnosis of abscess. Persistent LEFT-sided abdominal pain radiating to the back and LEFT groin. EXAM: CT ABDOMEN AND PELVIS WITH CONTRAST  TECHNIQUE: Multidetector CT imaging of the abdomen and pelvis was performed using the standard protocol following bolus administration of intravenous contrast. CONTRAST:  170m ISOVUE-300 IOPAMIDOL INJECTION 61% IV. COMPARISON:  07/25/2018, 12/06/2017 and earlier. FINDINGS: Lower chest: Severe emphysematous changes involving the visualized lung bases. Normal heart size. Hepatobiliary: Liver normal in size and appearance. Small gallstones within the gallbladder. No pericholecystic edema or inflammation. No biliary ductal dilation. Pancreas: Normal in appearance without evidence of mass, ductal dilation, or inflammation. Spleen: Normal in size and appearance. Focus of accessory splenic tissue MEDIAL to splint the hilum. Adrenals/Urinary Tract: Normal appearing adrenal glands. Kidneys normal in size and appearance without focal parenchymal abnormality. No evidence of urinary tract calculi or obstruction. Normal-appearing urinary bladder. Stomach/Bowel: Stomach decompressed and unremarkable. No evidence of small-bowel distention. Wall thickening involving several loops of small bowel in the pelvis, unchanged since the examination 6 days ago, some of which are adjacent to an abscess which will be described in detail below. Remainder of the small bowel unremarkable. Ileostomy in the RIGHT UPPER QUADRANT of the abdomen. Vascular/Lymphatic: No visible aortoiliofemoral atherosclerosis. Normal-appearing portal venous  and systemic venous systems. Scattered borderline enlarged retroperitoneal lymph nodes, unchanged. No new/enlarging lymphadenopathy. Reproductive: Prostate gland and seminal vesicles normal in size and appearance for age. Other: Trilobed abscess in the LEFT UPPER pelvis, unchanged from the CT 6 days ago, maximum 8.5 x 5.9 cm. No new pelvic abscess. 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 No acute findings. IMPRESSION: 1. Stable interloop abscess in the LEFT UPPER pelvis with inflammation of multiple small bowel loops in the pelvis. 2. No new abnormalities. 3. Satisfactory appearance to the ileostomy in the RIGHT UPPER QUADRANT. Electronically Signed   By: TEvangeline DakinM.D.   On: 07/31/2018 18:36    Procedures Procedures (including critical care time)  Medications Ordered in ED Medications  iopamidol (ISOVUE-300) 61 % injection (has no administration in time range)  lactated ringers bolus 2,000 mL (0 mLs Intravenous Stopped 07/31/18 1939)  ondansetron (ZOFRAN) injection 4 mg (4 mg Intravenous Given 07/31/18 1707)  HYDROmorphone (DILAUDID) injection 1 mg (1 mg Intravenous Given 07/31/18 1707)  iopamidol (ISOVUE-300) 61 % injection 100 mL (100 mLs Intravenous Contrast Given 07/31/18 1750)  HYDROmorphone (DILAUDID) injection 1 mg (1 mg Intravenous Given 07/31/18 1939)  HYDROmorphone (DILAUDID) injection 1 mg (1 mg Intravenous Given 07/31/18 2011)     Initial Impression / Assessment and Plan / ED Course  I have reviewed the triage vital signs and the nursing notes.  Pertinent labs & imaging results that were available during my care of the patient were reviewed by me and considered in my medical decision making (see chart for details).  Clinical Course as of Jul 31 2025  Mon Jul 31, 2018  1909 Recheck.  No additional episodes of vomiting since arrival in the ED.  He reports significant improvement in his pain for a short  period of time following Dilaudid administration, but the pain has returned and is severe.   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   Pain controlled in the ED with Dilaudid.  Zofran given with no additional episodes of emesis.  Labs today are reassuring with WBC 9.8.  There is a thrombocytosis of 587, likely reactive in nature.  Patient is afebrile and otherwise hemodynamically stable.  CT scan of the abdomen pelvis repeated since the patient was just discharged 3 days ago with a known left lower quadrant abdominal abscess, which demonstrated stable interloop abscess in the left upper pelvis.  Abscess is unchanged from previous, but no new pelvic abscess.  Spoke with Dr. Lucia Gaskins, general surgery, who recommended the patient follow-up tomorrow with Dr. Johney Maine and adjust the patient's home pain control regimen.  Lengthy discussion with the patient regarding his home pain control regimen.  He states that he minimally takes the oxycodone because of the way that it makes him feel.  He will typically take 1 tablet every 6 hours.  He is also intermittently been taking 400 mg of ibuprofen, but not consistently.  No Tylenol use, and the patient has not been taking anything for nausea or vomiting.  Discussed with the patient that he can take 1 to 2 tablets of oxycodone every 6 hours for severe pain.  For nausea and vomiting, we will discharge the patient with a course of Zofran, and also discussed how the patient  can utilize Tylenol, ibuprofen, and lidocaine patches for more effective pain control.  The patient was successfully fluid challenge in the ED.  Of note, the patient became tearful regarding his health while we were discussing his home pain regimen.  He states that he feels every time he takes a step forward and he is always "looking in the wings" waiting for the next thing to go wrong.  He is struggling with the fact that he sits at home all day while his wife goes to work.  He may benefit from getting established with an antidepressant on and out patient setting and were following up with behavioral health.  Strict return precautions given.  He is hemodynamically stable and in no acute distress.  He is safe for discharge to home with outpatient general surgery follow-up at this time.  Final Clinical Impressions(s) / ED Diagnoses   Final diagnoses:  Left lower quadrant abdominal abscess [facility])    ED Discharge Orders         Ordered    ondansetron (ZOFRAN ODT) 4 MG disintegrating tablet  Every 8 hours PRN     07/31/18 2013    lidocaine (LIDODERM) 5 %  Every 24 hours     07/31/18 2013    oxyCODONE (OXY IR/ROXICODONE) 5 MG immediate release tablet  Every 6 hours PRN     07/31/18 2015           Revanth Neidig A, PA-C 07/31/18 2026    Daleen Bo, MD 08/01/18 1101

## 2018-08-10 DIAGNOSIS — Z932 Ileostomy status: Secondary | ICD-10-CM | POA: Diagnosis not present

## 2018-08-10 DIAGNOSIS — Z432 Encounter for attention to ileostomy: Secondary | ICD-10-CM | POA: Diagnosis not present

## 2018-08-17 ENCOUNTER — Ambulatory Visit: Payer: Medicaid Other | Admitting: Family Medicine

## 2018-09-15 ENCOUNTER — Ambulatory Visit (INDEPENDENT_AMBULATORY_CARE_PROVIDER_SITE_OTHER): Payer: Medicaid Other | Admitting: Family Medicine

## 2018-09-15 ENCOUNTER — Other Ambulatory Visit: Payer: Self-pay

## 2018-09-15 ENCOUNTER — Encounter: Payer: Self-pay | Admitting: Family Medicine

## 2018-09-15 VITALS — BP 136/90 | HR 78 | Temp 98.0°F | Wt 146.0 lb

## 2018-09-15 DIAGNOSIS — M5412 Radiculopathy, cervical region: Secondary | ICD-10-CM | POA: Diagnosis not present

## 2018-09-15 DIAGNOSIS — Z23 Encounter for immunization: Secondary | ICD-10-CM | POA: Diagnosis not present

## 2018-09-15 MED ORDER — CYCLOBENZAPRINE HCL 10 MG PO TABS: 10.0000 mg | ORAL_TABLET | Freq: Three times a day (TID) | ORAL | 0 refills | Status: DC | PRN

## 2018-09-15 MED ORDER — GABAPENTIN 300 MG PO CAPS: 600.0000 mg | ORAL_CAPSULE | Freq: Three times a day (TID) | ORAL | 0 refills | Status: DC

## 2018-09-15 MED ORDER — ACETAMINOPHEN 325 MG PO TABS: 650.0000 mg | ORAL_TABLET | Freq: Four times a day (QID) | ORAL | Status: AC | PRN

## 2018-09-15 MED ORDER — MELOXICAM 7.5 MG PO TABS: 7.5000 mg | ORAL_TABLET | Freq: Every day | ORAL | 0 refills | Status: DC

## 2018-09-15 NOTE — Assessment & Plan Note (Signed)
Patient's symptoms and physical exam suggest cervical radiculopathy.  Given patient's history of cancer and radiation therapy, cervical xray's were ordered.  Patient was prescribed tylenol, meloxicam, flexeril, and his gabapentin was increased.  Patient was told to return in 5 days if these medications did not reduce his pain at all and to come back in 4 weeks if pain is still severe.  Also told to go to hospital if he starts to develop weakness in that arm or if sensation spreads to lower extremity or if he develops urinary incontinence.

## 2018-09-15 NOTE — Progress Notes (Signed)
   [facility] Phone: [phone]   cc: left arm pain   Subjective:  Patient states he has been having left shoulder and arm pain for three days.  He woke up with this pain 3 days ago.  He did not drink alcohol the night before nor did he sleep on it differently.  Since this pain started he has not been able to sleep more than 30 minutes at a time. He describes the pain as 'like when you blow up the blood pressure cuff on your arm'.  He does not use IV drugs. He does have a history of cancer and radiation therapy.  Ice, heat, ibuprofens have not helped with the pain so far.    ROS: See HPI for pertinent positives and negatives  Social history- patient is a current smoker  Objective: BP 136/90   Pulse 78   Temp 98 F (36.7 C) (Oral)   Wt 146 lb (66.2 kg)   SpO2 98%   BMI 19.26 kg/m  Gen: patient leaning over, favoring his left side, alert and oriented, cooperative with exam HEENT: NCAT, EOMI, MMM Msk: 5/5 strength in upper extremities bilaterally.   Neuro: no gross CN deficits. Brachial reflex 2+ bilaterally.  Sensation intact to light touch bilaterally in upper extremities.  Skin: No rashes, no lesions.  Tattoos on arms.  Psych: Appropriate behavior.    Assessment/Plan: Cervical radiculopathy Patient's symptoms and physical exam suggest cervical radiculopathy.  Given patient's history of cancer and radiation therapy, cervical xray's were ordered.  Patient was prescribed tylenol, meloxicam, flexeril, and his gabapentin was increased.  Patient was told to return in 5 days if these medications did not reduce his pain at all and to come back in 4 weeks if pain is still severe.  Also told to go to hospital if he starts to develop weakness in that arm or if sensation spreads to lower extremity or if he develops urinary incontinence.    Flu shot was given during this visit.     Clemetine Marker, MD PGY-1

## 2018-09-15 NOTE — Patient Instructions (Addendum)
[name],   Your symptoms sound like cervical radiculopathy, which just means the nerve in your neck is inflamed and is causing the pain down your arm.  This goes away with time in most people.  I would like to get an xray to make sure that there is not a mass in your spine that is pressing on this nerve.  Otherwise, you will just need to manage your pain with the medication I have prescribed.    Take your meloxicam once a day.  Take the tylenol every six hours.  Increase your gabapentin so that you are taking 600mg  each time you take it.  If you do not feel too drowsy on that amount you can increase it to 900mg  each time after 3-4 days.  Take the flexeril 3 times a day.    If your pain does not improve at all after 4-5 days of taking these medications or if you start to notice other symptoms like your arm becomes weak or you start to lose your balance then go to the hospital.  Other symptoms include losing control of your bladder.    Thank you   Clemetine Marker, MD

## 2018-09-27 DIAGNOSIS — Z932 Ileostomy status: Secondary | ICD-10-CM | POA: Diagnosis not present

## 2018-09-27 DIAGNOSIS — Z432 Encounter for attention to ileostomy: Secondary | ICD-10-CM | POA: Diagnosis not present

## 2018-10-11 ENCOUNTER — Other Ambulatory Visit: Payer: Self-pay | Admitting: Family Medicine

## 2018-10-30 DIAGNOSIS — Z432 Encounter for attention to ileostomy: Secondary | ICD-10-CM | POA: Diagnosis not present

## 2018-10-30 DIAGNOSIS — Z932 Ileostomy status: Secondary | ICD-10-CM | POA: Diagnosis not present

## 2018-12-01 DIAGNOSIS — Z932 Ileostomy status: Secondary | ICD-10-CM | POA: Diagnosis not present

## 2018-12-01 DIAGNOSIS — Z432 Encounter for attention to ileostomy: Secondary | ICD-10-CM | POA: Diagnosis not present

## 2019-01-09 DIAGNOSIS — Z432 Encounter for attention to ileostomy: Secondary | ICD-10-CM | POA: Diagnosis not present

## 2019-01-09 DIAGNOSIS — Z932 Ileostomy status: Secondary | ICD-10-CM | POA: Diagnosis not present

## 2019-01-13 ENCOUNTER — Other Ambulatory Visit: Payer: Self-pay | Admitting: Family Medicine

## 2019-02-09 DIAGNOSIS — Z432 Encounter for attention to ileostomy: Secondary | ICD-10-CM | POA: Diagnosis not present

## 2019-02-09 DIAGNOSIS — Z932 Ileostomy status: Secondary | ICD-10-CM | POA: Diagnosis not present

## 2019-02-27 ENCOUNTER — Other Ambulatory Visit: Payer: Self-pay | Admitting: Family Medicine

## 2019-03-01 ENCOUNTER — Ambulatory Visit: Payer: Medicaid Other | Admitting: Family Medicine

## 2019-03-12 DIAGNOSIS — Z932 Ileostomy status: Secondary | ICD-10-CM | POA: Diagnosis not present

## 2019-03-12 DIAGNOSIS — Z432 Encounter for attention to ileostomy: Secondary | ICD-10-CM | POA: Diagnosis not present

## 2019-04-12 DIAGNOSIS — Z932 Ileostomy status: Secondary | ICD-10-CM | POA: Diagnosis not present

## 2019-04-12 DIAGNOSIS — Z432 Encounter for attention to ileostomy: Secondary | ICD-10-CM | POA: Diagnosis not present

## 2019-04-14 IMAGING — CT CT CHEST W/ CM
2 of 4 series · 15 of 36 positions shown, 18 images · IV contrast (ISOVUE 300)
Comparison: 11/29/2016 and PET 08/18/2016.

CLINICAL DATA: Rectal cancer, chemotherapy complete, radiation
therapy complete. Weight loss. Lung nodules.

EXAM:
CT CHEST WITH CONTRAST
TECHNIQUE: Multidetector CT imaging of the chest was performed during
intravenous contrast administration.
CONTRAST:  75 cc Ysovue-DZZ.

[Series 2: axial st · axial · 0.80mm/px · z∈[-318,-28]mm · 12 of 173 slices shown, 15 images]
[im 14/173  mediastinal]
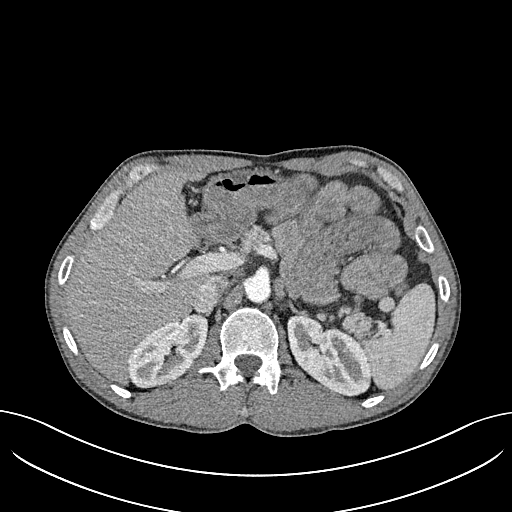
[im 14/173  lung]
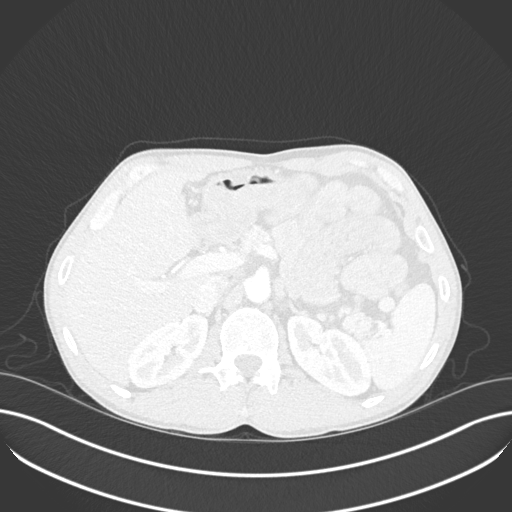
[im 27/173  lung]
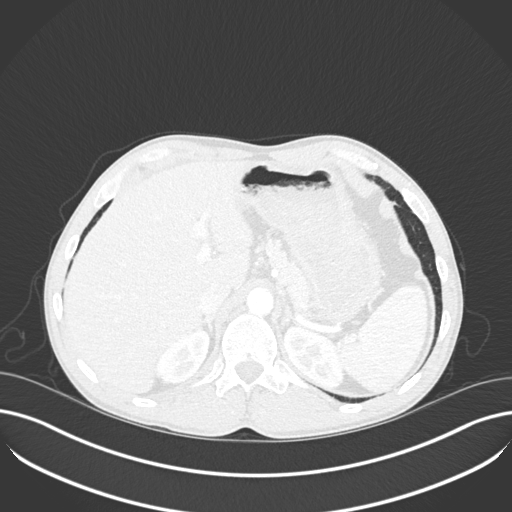
[im 40/173  lung]
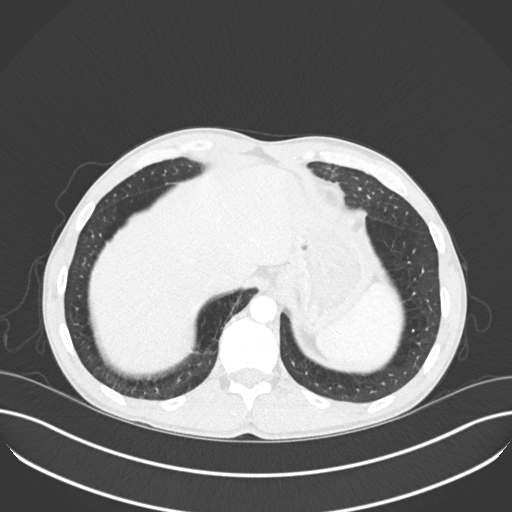
[im 53/173  lung]
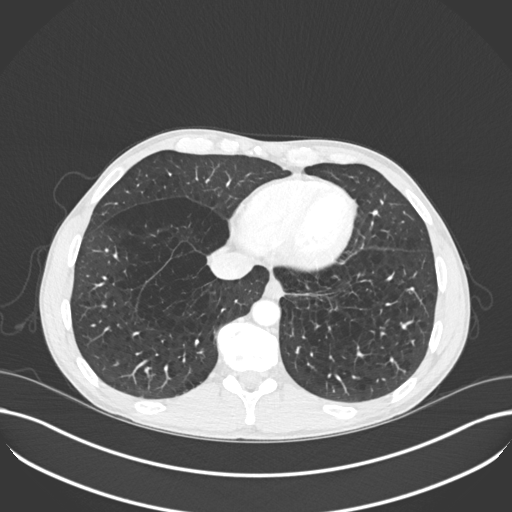
[im 67/173  mediastinal]
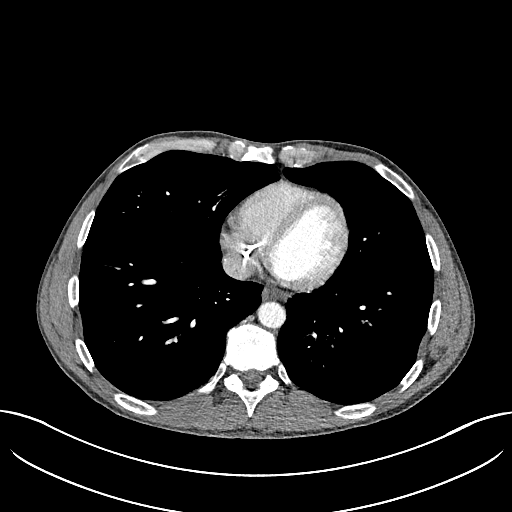
[im 67/173  lung]
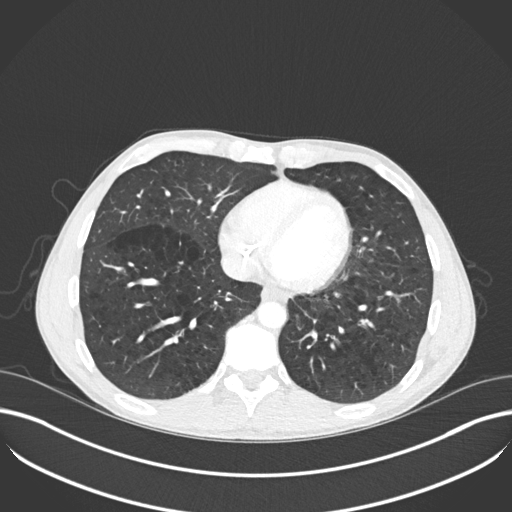
[im 80/173  lung]
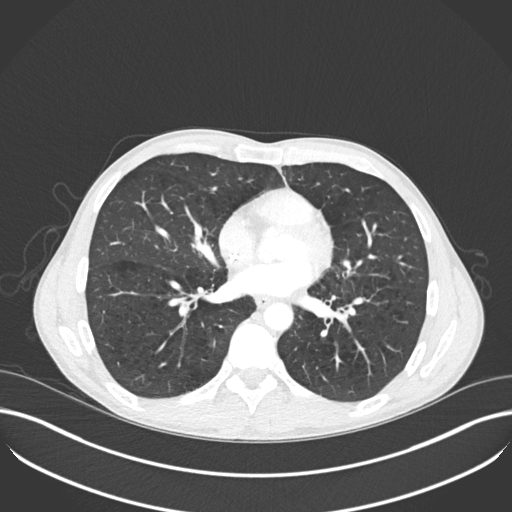
[im 93/173  lung]
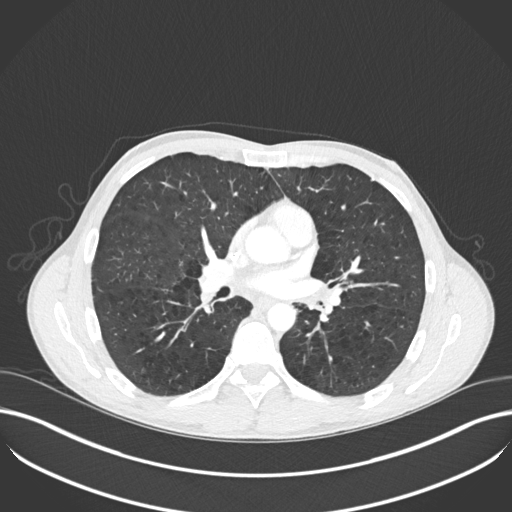
[im 106/173  lung]
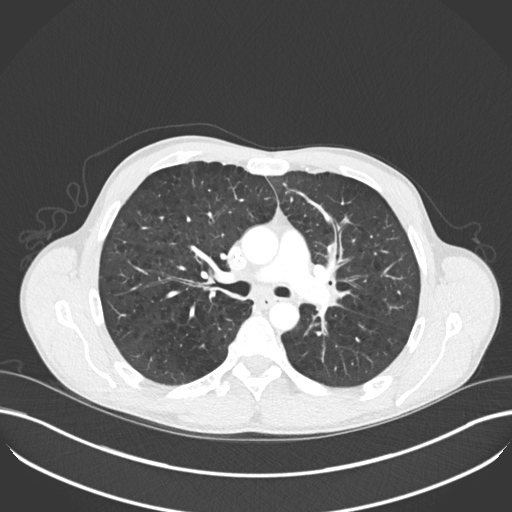
[im 120/173  mediastinal]
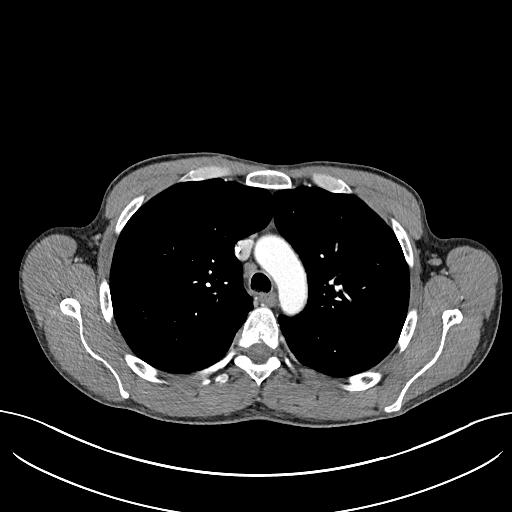
[im 120/173  lung]
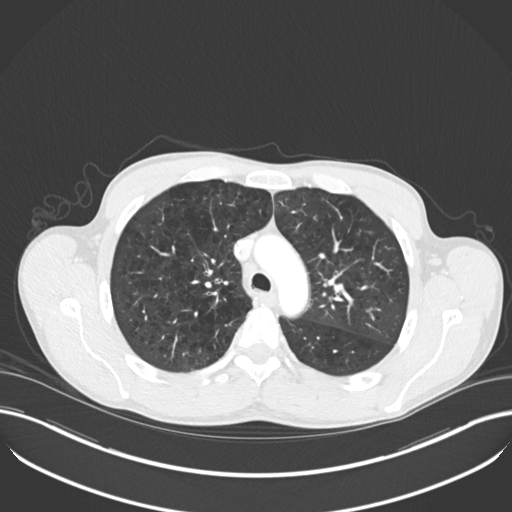
[im 133/173  lung]
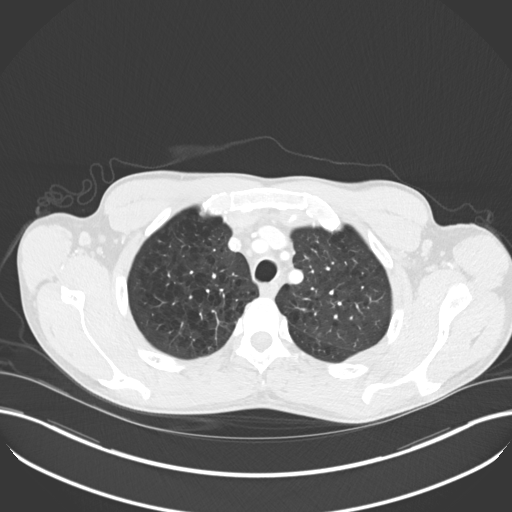
[im 146/173  lung]
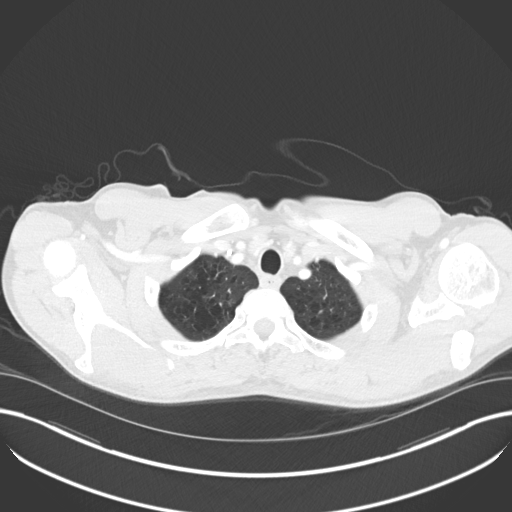
[im 159/173  lung]
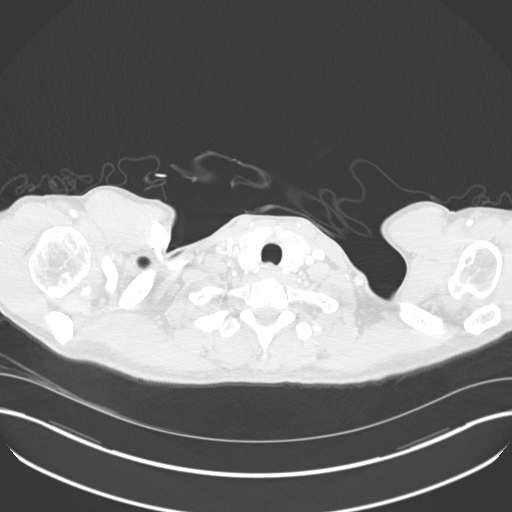

[Series 6: coronal · coronal · 0.67mm/px · 3 of 135 slices shown]
[im 27/135  lung]
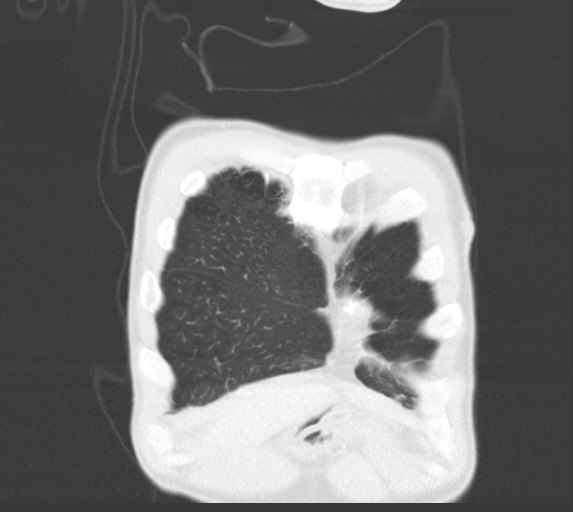
[im 54/135  lung]
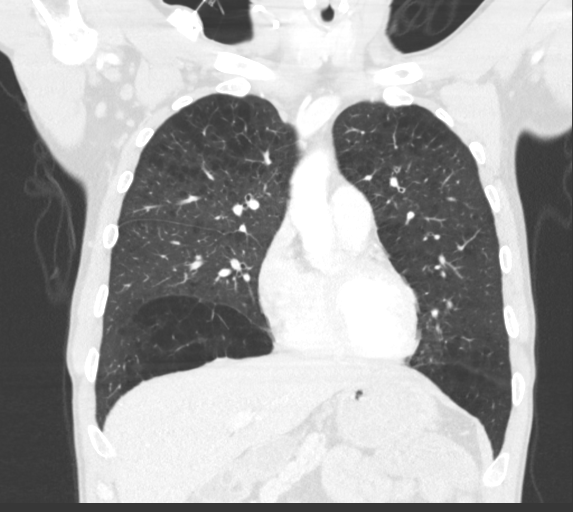
[im 81/135  lung]
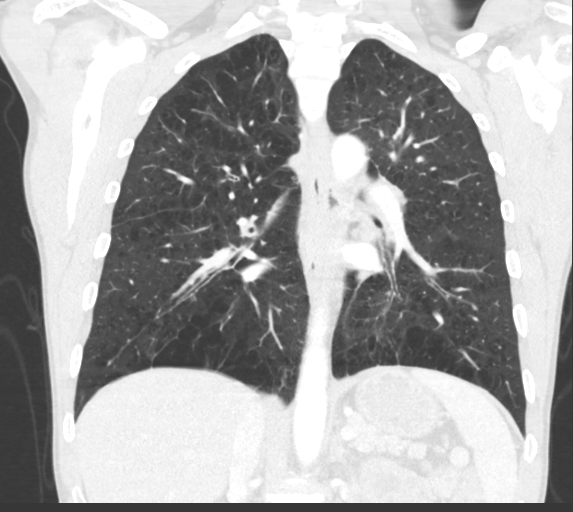

[15 of 36 positions shown; findings below may reference images not displayed]

FINDINGS: Cardiovascular: Right IJ Port-A-Cath terminates in the right atrium.
Vascular structures are otherwise unremarkable. Heart size normal.
No pericardial effusion.

Mediastinum/Nodes: Mediastinal lymph nodes are not enlarged by CT
size criteria. 11 mm right hilar lymph node, unchanged. Left hilar
lymphoid tissue. Axillary lymph nodes are not enlarged by CT size
criteria. Esophagus is grossly unremarkable.

Lungs/Pleura: Moderate centrilobular emphysema. 3 mm apical segment
right upper lobe nodule, unchanged. Scattered subpleural pulmonary
nodules measure 5 mm or less in size, stable. No pleural fluid.
Debris is seen dependently in the trachea and right mainstem
bronchus.

Upper Abdomen: Visualized portions of the liver, adrenal glands,
kidneys, spleen, pancreas, stomach and bowel are grossly
unremarkable. Porta hepatis lymph node measures 1.4 cm, stable.

Musculoskeletal: Degenerative changes are seen in the spine.
IMPRESSION: 1. No evidence of metastatic disease.
2.  Emphysema ([id number]).
3. Scattered subpleural pulmonary nodules measure 5 mm or less in
size, stable and likely subpleural lymph nodes.

## 2019-04-15 ENCOUNTER — Other Ambulatory Visit: Payer: Self-pay | Admitting: Family Medicine

## 2019-05-16 ENCOUNTER — Other Ambulatory Visit: Payer: Self-pay | Admitting: Family Medicine

## 2019-05-30 DIAGNOSIS — Z932 Ileostomy status: Secondary | ICD-10-CM | POA: Diagnosis not present

## 2019-05-30 DIAGNOSIS — Z432 Encounter for attention to ileostomy: Secondary | ICD-10-CM | POA: Diagnosis not present

## 2019-06-05 ENCOUNTER — Telehealth: Payer: Self-pay | Admitting: *Deleted

## 2019-06-05 ENCOUNTER — Telehealth: Payer: Self-pay | Admitting: Hematology

## 2019-06-05 NOTE — Telephone Encounter (Signed)
Schedule message sent. 

## 2019-06-05 NOTE — Telephone Encounter (Signed)
SO [name] called reporting pt has shortness of breath.  Spoke with [name], and was informed that pt has increased shortness of breath with walking long distance.  Pt feels fine at rest or doing chores in short duration.  [name] stated pt's breathing symptom started about 3 - 4 months ago.  [name] wanted to know if pt could have appt with Dr. Burr Medico. Informed [name] that MD is not in office today.  [name] understood that her nurse will contact [name] for further instructions from MD. [facility]    Phone      [phone].

## 2019-06-05 NOTE — Telephone Encounter (Signed)
Per 6/16 schedule message spoke with [name] re 6/17 appointments.

## 2019-06-05 NOTE — Telephone Encounter (Signed)
He has lost f/u since 02/2018. Please schedule lab (flush? I think he still has port) and f/u with me tomorrow, I can see him at 11:45am or 3:45pm.   Truitt Merle MD

## 2019-06-06 ENCOUNTER — Inpatient Hospital Stay: Payer: Medicaid Other

## 2019-06-06 ENCOUNTER — Other Ambulatory Visit: Payer: Self-pay

## 2019-06-06 ENCOUNTER — Inpatient Hospital Stay: Payer: Medicaid Other | Attending: Hematology | Admitting: Hematology

## 2019-06-06 VITALS — BP 110/72 | HR 62 | Temp 98.2°F | Resp 18 | Ht 73.0 in | Wt 169.1 lb

## 2019-06-06 DIAGNOSIS — Z9049 Acquired absence of other specified parts of digestive tract: Secondary | ICD-10-CM | POA: Insufficient documentation

## 2019-06-06 DIAGNOSIS — Z9221 Personal history of antineoplastic chemotherapy: Secondary | ICD-10-CM | POA: Insufficient documentation

## 2019-06-06 DIAGNOSIS — F17210 Nicotine dependence, cigarettes, uncomplicated: Secondary | ICD-10-CM | POA: Diagnosis not present

## 2019-06-06 DIAGNOSIS — C20 Malignant neoplasm of rectum: Secondary | ICD-10-CM | POA: Insufficient documentation

## 2019-06-06 DIAGNOSIS — Z932 Ileostomy status: Secondary | ICD-10-CM | POA: Insufficient documentation

## 2019-06-06 DIAGNOSIS — R0602 Shortness of breath: Secondary | ICD-10-CM | POA: Insufficient documentation

## 2019-06-06 DIAGNOSIS — G620 Drug-induced polyneuropathy: Secondary | ICD-10-CM | POA: Diagnosis not present

## 2019-06-06 DIAGNOSIS — R918 Other nonspecific abnormal finding of lung field: Secondary | ICD-10-CM | POA: Insufficient documentation

## 2019-06-06 DIAGNOSIS — Z1509 Genetic susceptibility to other malignant neoplasm: Secondary | ICD-10-CM | POA: Diagnosis not present

## 2019-06-06 DIAGNOSIS — F418 Other specified anxiety disorders: Secondary | ICD-10-CM | POA: Insufficient documentation

## 2019-06-06 LAB — CBC WITH DIFFERENTIAL/PLATELET
Abs Immature Granulocytes: 0.42 10*3/uL — ABNORMAL HIGH (ref 0.00–0.07)
Basophils Absolute: 0.1 10*3/uL (ref 0.0–0.1)
Basophils Relative: 1 %
Eosinophils Absolute: 0.3 10*3/uL (ref 0.0–0.5)
Eosinophils Relative: 3 %
HCT: 50.7 % (ref 39.0–52.0)
Hemoglobin: 16.6 g/dL (ref 13.0–17.0)
Immature Granulocytes: 5 %
Lymphocytes Relative: 15 %
Lymphs Abs: 1.4 10*3/uL (ref 0.7–4.0)
MCH: 29.3 pg (ref 26.0–34.0)
MCHC: 32.7 g/dL (ref 30.0–36.0)
MCV: 89.4 fL (ref 80.0–100.0)
Monocytes Absolute: 0.6 10*3/uL (ref 0.1–1.0)
Monocytes Relative: 6 %
Neutro Abs: 6.4 10*3/uL (ref 1.7–7.7)
Neutrophils Relative %: 70 %
Platelets: 250 10*3/uL (ref 150–400)
RBC: 5.67 MIL/uL (ref 4.22–5.81)
RDW: 13.7 % (ref 11.5–15.5)
WBC: 9.2 10*3/uL (ref 4.0–10.5)
nRBC: 0 % (ref 0.0–0.2)

## 2019-06-06 LAB — COMPREHENSIVE METABOLIC PANEL
ALT: 23 U/L (ref 0–44)
AST: 20 U/L (ref 15–41)
Albumin: 2.7 g/dL — ABNORMAL LOW (ref 3.5–5.0)
Alkaline Phosphatase: 60 U/L (ref 38–126)
Anion gap: 7 (ref 5–15)
BUN: 12 mg/dL (ref 6–20)
CO2: 21 mmol/L — ABNORMAL LOW (ref 22–32)
Calcium: 7.8 mg/dL — ABNORMAL LOW (ref 8.9–10.3)
Chloride: 111 mmol/L (ref 98–111)
Creatinine, Ser: 0.89 mg/dL (ref 0.61–1.24)
GFR calc Af Amer: >60 mL/min (ref >60)
GFR calc non Af Amer: >60 mL/min (ref >60)
Glucose, Bld: 126 mg/dL — ABNORMAL HIGH (ref 70–99)
Potassium: 4.2 mmol/L (ref 3.5–5.1)
Sodium: 139 mmol/L (ref 135–145)
Total Bilirubin: <0.2 mg/dL — ABNORMAL LOW (ref 0.3–1.2)
Total Protein: 5.6 g/dL — ABNORMAL LOW (ref 6.5–8.1)

## 2019-06-06 LAB — CEA (IN HOUSE-CHCC): CEA (CHCC-In House): 1.38 ng/mL (ref 0.00–5.00)

## 2019-06-06 MED ORDER — HEPARIN SOD (PORK) LOCK FLUSH 100 UNIT/ML IV SOLN
500.0000 [IU] | Freq: Once | INTRAVENOUS | Status: DC | PRN
  Filled 2019-06-06: qty 5

## 2019-06-06 MED ORDER — SODIUM CHLORIDE 0.9% FLUSH
10.0000 mL | INTRAVENOUS | Status: DC | PRN
  Administered 2019-06-06: 11:00:00 10 mL
  Filled 2019-06-06: qty 10

## 2019-06-06 NOTE — Progress Notes (Signed)
[place]   [phone] [phone]   Clinic Follow up Note   Patient Care Team: Nuala Alpha, DO as PCP - General (Family Medicine) Michael Boston, MD as Consulting Physician (General Surgery) [name], Kirke Corin, MD as Consulting Physician (Gastroenterology) Kyung Rudd, MD as Consulting Physician (Radiation Oncology) Truitt Merle, MD as Consulting Physician (Oncology)  Date of Service:  06/06/2019  CHIEF COMPLAINT: Follow up rectal cancer  SUMMARY OF ONCOLOGIC HISTORY: Oncology History Overview Note  Rectal adenocarcinoma [facility])   Staging form: Colon and Rectum, AJCC 7th Edition   - Clinical stage from 07/28/2016: Stage IIIB (T3, N2a, M0) - Signed by Truitt Merle, MD on 08/06/2016    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  07/26/2016 Imaging   CT chest, abdomen and pelvis with contrast showed nodular thickening of the rectal wall, enlarged perirectal lymph nodes. Diverticulosis. Nonspecific low-level lymphadenopathy. Lower lobe emphysema, small 3-4 mm left lower lobe lung nodules.   07/28/2016 Initial Diagnosis   Rectal adenocarcinoma ([place])   07/28/2016 Initial Biopsy   Rectal mass biopsy showed invasive adenocarcinoma, polyps in the ascending colon and rectum showed tubular adenoma    07/28/2016 Procedure   Colonoscopy showed a nearly circumferential mass in the distal rectum, 4 polyps in the descending colon, diverticulosis in the left colon.    07/29/2016 Procedure   Low EUS showed clearly malignant 6 cm long, nearly circumferential mass in the distal rectum, with distal edge located to 1 cm from the annual approach, multiple prior rectal lymph nodes (6) are suspicious ,uT3 N2    08/17/2016 - 09/24/2016 Radiation Therapy   Neoadjuvant radiation to his rectal cancer 1) Rectum/ 45 Gy in 25 fx                      2) Boost/ 5.4 Gy in 3 fx    08/17/2016 - 09/24/2016 Chemotherapy   Xeloda 1500 mg twice daily with concurrent irradiation  08/18/2016 Imaging   PET scan showed hypermetabolic rectal mass, perirectal node 14m with mild increased uptake, no hypermetabolic distant metastasis, small pulmonary nodules are too small to characterize by PET.    11/29/2016 Imaging   CT of the C/A/P W Contrast IMPRESSION: 1. Interval treatment changes within the perirectal fat with otherwise stable rectosigmoid colon wall thickening. 2. No progressive adenopathy or distant metastases identified. 3. Stable bilateral pulmonary nodules. Short-term stability (for 4 months) is reassuring, although the nodules are too small to optimally characterize by previous PET-CT. Continued CT follow-up recommended.   12/01/2016 Surgery   Colon total resection and proctocolectomy for rectal cancer and Lynch syndrome    12/01/2016 Pathology Results   Proctocolectomy showed scattered small residual foci of invasive moderately differentiated adenocarcinoma embedded in submucosa tissue with extensive neoadjuvant effect, 2.5 cm in greatest time mention, adenocarcinoma invades through muscularis mucosa and involves subserosal soft tissue, margins are negative, 2 of 35 lymph nodes are positive for metastatic adenocarcinoma, 2 calcified and necrotic soft tissue nodules are also present without visible tumor seen.   01/07/2017 - 05/26/2017 Chemotherapy   Adjuvant chemo CAPOX, changed to FOLFOX from cycle 2 due to poor tolerance, a total of 4.5 months therapy     01/26/2017 Surgery   PLACEMENT OF PORT-A-CATH CENTRAL LINE WITH FLUOROSCOPY AND ULTRASOUND   06/21/2017 Imaging   CT Chest w contrast 06/21/2017 IMPRESSION: 1. No evidence of metastatic disease. 2.  Emphysema (ICD10-J43.9). 3. Scattered subpleural pulmonary nodules measure 5 mm or less in size, stable and  likely subpleural lymph nodes.   09/21/2017 Procedure   Pouchoscopy by Dr. Marcello Moores on 09/21/17 IMPRESSION  - The examined portion of the ileum was normal. - No specimens collected.   09/22/2017 Surgery    TAKEDOWN OF LOOP ILEOSTOMY by Dr. Johney Maine on 09/22/17  Diagnosis 09/22/17  Ileostomy, loop SKIN AND COLONIC MUCOSA NEGATIVE FOR CARCINOMA   12/06/2017 Imaging   CT CAP W Contrast 12/06/17 IMPRESSION: 1. Postoperative changes of total proctocolectomy and ileoanal anastomosis without evidence of metastatic disease. Ileoanal anastomosis appears tethered to the sacrum by presacral thickening. 2.  Emphysema (ICD10-J43.9).   01/12/2018 Procedure   Pouchoscopy by Dr. Marcello Moores 01/12/18 - Ileoanal anastomotic stricture found on perianal exam. - No specimens collected. - Erythematous mucosa in the ileoanal pouch. Biopsied.   Pathology results  Diagnosis 01/12/18 Rectum, biopsy, J Pouch mucosa - SMALL BOWEL TYPE MUCOSA WITH CHRONIC INFLAMMATION - NO GRANULOMATA, DYSPLASIA, OR MALIGNANCY IDENTIFIED      CURRENT THERAPY:  Surveillance  INTERVAL HISTORY:  [name] is here for a follow up rectal cancer. He was last seen by me in 01/2018 and lost follow up. He presents to the clinic alone. His significant other was called during visit. He notes for 2-3 months he has had worsening SOB with exertion. He denies chest pain or cyanois. He will have to stop with stairs or mowing the lawn. He notes a smokers cough. He notes he still smokes at least 1 ppd.  He denies any other pain.  He notes he had ileostomy taken down but had to put it back in due to uncontrolled diarrhea. He notes his BM are now controlled. He notes having neuropathy still in hands and feet which is manageable.    REVIEW OF SYSTEMS:   Constitutional: Denies fevers, chills or abnormal weight loss Eyes: Denies blurriness of vision Ears, nose, mouth, throat, and face: Denies mucositis or sore throat Respiratory: Denies dyspnea or wheezes (+) Cough  Cardiovascular: Denies palpitation, chest discomfort or lower extremity swelling Gastrointestinal:  Denies nausea, heartburn or change in bowel habits (+) Colostomy bag  Skin: Denies  abnormal skin rashes Lymphatics: Denies new lymphadenopathy or easy bruising Neurological: (+) Neuropathy in hands and feet  Behavioral/Psych: Mood is stable, no new changes  All other systems were reviewed with the patient and are negative.  MEDICAL HISTORY:  Past Medical History:  Diagnosis Date  . Anxiety   . C. difficile diarrhea 03/30/2018  . Colon cancer ([place]) 12/01/2016  . Depression   . Family history of colon cancer   . 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 10/13/2016   MSH6 pathogenic mutation  Lynch syndrome screening recs (from up to date)  Annual colonoscopy starting between the ages of 68 and 96 years, or 10 years prior to the earliest age of colon cancer diagnosis in the family (whichever comes first). In families with MSH6 mutations, screening can start at age 44 years since the onset of colon cancer is later in these families.  Annual screening for endome  . Poor dental hygiene   . Rectal adenocarcinoma s/p protctocolectomy, IPAA "J" pouch 12/01/2016 08/06/2016  . 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 COLON SURGERY    . EUS N/A 07/29/2016   Procedure: LOWER ENDOSCOPIC ULTRASOUND (EUS);  Surgeon: Milus Banister, MD;  Location: [facility];  Service: Endoscopy;  Laterality: N/A;  .  ILEOSTOMY CLOSURE N/A 09/22/2017   Procedure: TAKEDOWN OF LOOP ILEOSTOMY;  Surgeon: Michael Boston, MD;  Location: [facility];  Service: General;  Laterality: N/A;  . LYSIS OF ADHESION N/A 05/10/2018   Procedure: LYSIS OF ADHESION;  Surgeon: Michael Boston, MD;  Location: [facility];  Service: General;  Laterality: N/A;  . PORTACATH PLACEMENT N/A 01/26/2017   Procedure: PLACEMENT OF PORT-A-CATH CENTRAL LINE WITH FLUOROSCOPY AND ULTRASOUND;  Surgeon: Michael Boston, MD;  Location: [facility];  Service: General;   Laterality: N/A;  . POUCHOSCOPY N/A 09/21/2017   Procedure: POUCHOSCOPY;  Surgeon: Leighton Ruff, MD;  Location: [facility];  Service: Endoscopy;  Laterality: N/A;  . POUCHOSCOPY N/A 01/12/2018   Procedure: POUCHOSCOPY WITH BIOPSIES;  Surgeon: Leighton Ruff, MD;  Location: [facility];  Service: Endoscopy;  Laterality: N/A;  Local  . PROCTOSCOPY N/A 12/01/2016   Procedure: RIGID PROCTOSCOPY;  Surgeon: Michael Boston, MD;  Location: [facility];  Service: General;  Laterality: N/A;  . RECTAL EXAM UNDER ANESTHESIA N/A 05/10/2018   Procedure: RECTAL EXAM UNDER ANESTHESIA;  Surgeon: Michael Boston, MD;  Location: [facility];  Service: General;  Laterality: N/A;  . TOE AMPUTATION Left   . XI ROBOTIC ASSISTED LOWER ANTERIOR RESECTION N/A 12/01/2016   Procedure: XI ROBOTIC ASSISTED PROCTOCOLECTOMY WITH ILLEOPOUCH ANASTAMOSIS WITH DIVERTING ILLEOSTOMY;  Surgeon: Michael Boston, MD;  Location: [facility];  Service: General;  Laterality: N/A;  . XI ROBOTIC ASSISTED LOWER ANTERIOR RESECTION N/A 05/10/2018   Procedure: XI ROBOTIC RESECTION OF ILEAL J POUCH, LYSIS OF ADHESIONS, WITH CREATION OF PERMANENT END ILEOSTOMY.;  Surgeon: Michael Boston, MD;  Location: [facility];  Service: General;  Laterality: N/A;    I have reviewed the social history and family history with the patient and they are unchanged from previous note.  ALLERGIES:  has No Known Allergies.  MEDICATIONS:  Current Outpatient Medications  Medication Sig Dispense Refill  . cyclobenzaprine (FLEXERIL) 10 MG tablet Take 1 tablet (10 mg total) by mouth 3 (three) times daily as needed for muscle spasms. 30 tablet 0  . Famotidine (PEPCID AC PO) Take 1 tablet by mouth daily as needed (reflux).    . gabapentin (NEURONTIN) 300 MG capsule Take 1 capsule (300 mg total) by mouth 3 (three) times daily. 90 capsule 3  . gabapentin (NEURONTIN) 300 MG capsule TAKE 2 CAPSULES BY MOUTH 3 TIMES A DAY 180 capsule 0  . ibuprofen (ADVIL,MOTRIN) 200 MG tablet Take 400 mg by mouth  every 6 (six) hours as needed (pain.).    Marland Kitchen meloxicam (MOBIC) 7.5 MG tablet TAKE 1 TABLET BY MOUTH EVERY DAY 30 tablet 0  . sertraline (ZOLOFT) 50 MG tablet TAKE 1 TABLET BY MOUTH EVERY DAY 30 tablet 3   No current facility-administered medications for this visit.    Facility-Administered Medications Ordered in Other Visits  Medication Dose Route Frequency Provider Last Rate Last Dose  . sodium chloride flush (NS) 0.9 % injection 10 mL  10 mL Intravenous PRN Truitt Merle, MD   10 mL at 11/18/17 0953    PHYSICAL EXAMINATION: ECOG PERFORMANCE STATUS: 1 - Symptomatic but completely ambulatory  Vitals:   06/06/19 1144  BP: 110/72  Pulse: 62  Resp: 18  Temp: 98.2 F (36.8 C)  SpO2: 99%   Filed Weights   06/06/19 1144  Weight: 169 lb 1.6 oz (76.7 kg)    GENERAL:alert, no distress and comfortable SKIN: skin color, texture, turgor are normal, no rashes or significant lesions EYES: normal, Conjunctiva are pink and  non-injected, sclera clear NECK: supple, thyroid normal size, non-tender, without nodularity LYMPH:  no palpable lymphadenopathy in the cervical, axillary  LUNGS: clear to auscultation and percussion with normal breathing effort HEART: regular rate & rhythm and no murmurs and no lower extremity edema ABDOMEN:abdomen soft, non-tender and normal bowel sounds (+) iliostomy bag in place, clean   Musculoskeletal:no cyanosis of digits and no clubbing  NEURO: alert & oriented x 3 with fluent speech, no focal motor/sensory deficits  LABORATORY DATA:  I have reviewed the data as listed CBC Latest Ref Rng & Units 06/06/2019 07/31/2018 07/28/2018  WBC 4.0 - 10.5 K/uL 9.2 9.8 8.2  Hemoglobin 13.0 - 17.0 g/dL 16.6 13.2 11.7(L)  Hematocrit 39.0 - 52.0 % 50.7 40.4 35.6(L)  Platelets 150 - 400 K/uL 250 587(H) 433(H)     CMP Latest Ref Rng & Units 06/06/2019 07/31/2018 07/28/2018  Glucose 70 - 99 mg/dL 126(H) 96 -  BUN 6 - 20 mg/dL 12 <5(L) -  Creatinine 0.61 - 1.24 mg/dL 0.89 0.83 0.87   Sodium 135 - 145 mmol/L 139 141 -  Potassium 3.5 - 5.1 mmol/L 4.2 4.0 3.5  Chloride 98 - 111 mmol/L 111 108 -  CO2 22 - 32 mmol/L 21(L) 25 -  Calcium 8.9 - 10.3 mg/dL 7.8(L) 8.5(L) -  Total Protein 6.5 - 8.1 g/dL 5.6(L) 6.3(L) -  Total Bilirubin 0.3 - 1.2 mg/dL <0.2(L) 0.5 -  Alkaline Phos 38 - 126 U/L 60 140(H) -  AST 15 - 41 U/L 20 18 -  ALT 0 - 44 U/L 23 14 -      RADIOGRAPHIC STUDIES: I have personally reviewed the radiological images as listed and agreed with the findings in the report. No results found.   ASSESSMENT & PLAN:  [name] is a 48 y.o. male with   1. 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 He was treated with neoadjuvant concurrent chemoRT, colon surgery, adjuvant chemo and ileostomy reversal.  -He had iliostomy placed again due to uncontrolled diarrhea. It's manageable now  -He still has port. He did not have it flushed for month, but has received flush today. Given high risk of recurrence he may keep port for total 2-3 years. He will continue flush every 8 weeks.  -he lost f/u after last visit over a year ago. He recently developed dyspnea on exertion, will obtain CT CAP to rule our metastasis given his high risk of disease.  -If scan clear will f/u with him for 4 months twice more then every 6 months.   2. Dyspnea on exertion -For 3-4 months he has been having SOB with mild to moderate exertion  -Labs do not show anemia.  -He has a history of smoking and is currently smoking at least 1 ppd. He does have cough which he attributes to smoking.  -I reviewed smoking cessation as this can lead to lung disease or lung cancer. He understands.   3. 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 Marcello Moores for further evaluation on 01/12/18. Results showed chronic inflammation, overall benign. -Since worsened diarrhea after ileostomy reversal he had ileostomy placed again. BM controlled now.   4. Lynch Syndrome, MSH6 pathogenic mutation  -He underwent genetic testing, and was found to have MSH6 pathogenic  mutation and PTEN mutation with unknown significance. -He has undergone total colectomy and proctocolectomy, his risk of secondary colon cancer is minimal -I previously discussed with the pt other cancer risks from age syndrome, I previously recommend him to have EGD every 3-5 years, annual urinalysis for GU cancer screening.  -He has sibling but one brother has not been checked. His son is currently 59. I encouraged him to have him checked at adult age.   4. Anxiety, Depression  -He previously felt nervous and anxious since his cancer diagnosis, much improved since diarrhea resolved and treatment completed   5. Social issues  -He is out of work, he wants to apply for Medicaid and disability  -He was seen by our Education officer, museum  6. 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 He will continue Gabapentin '300mg'$  BID.  Plan  -CT CAP  W contrast in 1-2 weeks  -Lab, flush, f/u in 4 months, sooner if CT abnormal  -port flush in 2 months    No problem-specific Assessment & Plan notes found for this encounter.   Orders Placed This Encounter  Procedures  . CT Abdomen Pelvis W Contrast    New dyspnea on exertion, rule out cancer recurrence    Standing Status:   Future    Standing Expiration Date:   06/05/2020    Order Specific Question:   If indicated for the ordered procedure, I authorize the administration of contrast media per Radiology protocol    Answer:   Yes    Order Specific Question:   Preferred imaging location?    Answer:   [facility]    Order Specific Question:   Is Oral  Contrast requested for this exam?    Answer:   Yes, Per Radiology protocol    Order Specific Question:   Radiology Contrast Protocol - do NOT remove file path    Answer:   \\[email or web address].[email or web address]  . CT Chest W Contrast    Dyspnea on exertion, rule out metastasis    Standing Status:   Future    Standing Expiration Date:   06/05/2020    Order Specific Question:   If indicated for the ordered procedure, I authorize the administration of contrast media per Radiology protocol    Answer:   Yes    Order Specific Question:   Preferred imaging location?    Answer:   [facility]    Order Specific Question:   Radiology Contrast Protocol - do NOT remove file path    Answer:   \\[email or web address]  . [facility] (IN HOUSE-[facility])   All questions were answered. The patient knows to call the clinic with any problems, questions or concerns. No barriers to learning was detected. I spent 25 minutes counseling the patient face to face. The total time spent in the appointment was 30 minutes and more than 50% was on counseling and review of test results     Truitt Merle, MD 06/06/2019   I, Joslyn Devon, am acting as scribe for Truitt Merle, MD.   I have reviewed the above documentation for accuracy and completeness, and I agree with the above.

## 2019-06-07 ENCOUNTER — Encounter: Payer: Self-pay | Admitting: Hematology

## 2019-06-07 ENCOUNTER — Telehealth: Payer: Self-pay | Admitting: Hematology

## 2019-06-07 NOTE — Telephone Encounter (Signed)
Scheduled appt per 6/17 los.  Printed and mailed calendar.

## 2019-06-13 ENCOUNTER — Ambulatory Visit (HOSPITAL_COMMUNITY): Admission: RE | Admit: 2019-06-13 | Payer: Medicaid Other | Source: Ambulatory Visit

## 2019-06-20 ENCOUNTER — Ambulatory Visit (HOSPITAL_COMMUNITY): Admission: RE | Admit: 2019-06-20 | Payer: Medicaid Other | Source: Ambulatory Visit

## 2019-06-25 ENCOUNTER — Ambulatory Visit (HOSPITAL_COMMUNITY)
Admission: RE | Admit: 2019-06-25 | Discharge: 2019-06-25 | Disposition: A | Discharge status: Home / Self Care | Payer: Medicaid Other | Source: Ambulatory Visit | Attending: Hematology | Admitting: Hematology

## 2019-06-25 ENCOUNTER — Other Ambulatory Visit: Payer: Self-pay

## 2019-06-25 ENCOUNTER — Encounter (HOSPITAL_COMMUNITY): Payer: Self-pay

## 2019-06-25 DIAGNOSIS — C20 Malignant neoplasm of rectum: Secondary | ICD-10-CM | POA: Diagnosis not present

## 2019-06-25 DIAGNOSIS — R918 Other nonspecific abnormal finding of lung field: Secondary | ICD-10-CM | POA: Diagnosis not present

## 2019-06-25 DIAGNOSIS — K8020 Calculus of gallbladder without cholecystitis without obstruction: Secondary | ICD-10-CM | POA: Diagnosis not present

## 2019-06-25 MED ORDER — HEPARIN SOD (PORK) LOCK FLUSH 100 UNIT/ML IV SOLN
INTRAVENOUS | Status: AC
  Filled 2019-06-25: qty 5

## 2019-06-25 MED ORDER — IOHEXOL 300 MG/ML  SOLN
100.0000 mL | Freq: Once | INTRAMUSCULAR | Status: AC | PRN
  Administered 2019-06-25: 13:00:00 100 mL via INTRAVENOUS

## 2019-06-25 MED ORDER — HEPARIN SOD (PORK) LOCK FLUSH 100 UNIT/ML IV SOLN
500.0000 [IU] | Freq: Once | INTRAVENOUS | Status: AC
  Administered 2019-06-25: 13:00:00 500 [IU] via INTRAVENOUS

## 2019-06-25 MED ORDER — SODIUM CHLORIDE (PF) 0.9 % IJ SOLN
INTRAMUSCULAR | Status: AC
  Filled 2019-06-25: qty 50

## 2019-06-26 ENCOUNTER — Telehealth: Payer: Self-pay | Admitting: *Deleted

## 2019-06-26 NOTE — Telephone Encounter (Signed)
-----   Message from Truitt Merle, MD sent at 06/26/2019 11:41 AM EDT ----- Please call pt and let him know the CT scan findings. No cancer recurrence or other concerns. His SOB is possibly related to his emphysema, I recommend him to f/u with his PCP for his SOB if he is still concerned. Thanks   Truitt Merle  06/26/2019

## 2019-06-26 NOTE — Telephone Encounter (Signed)
Pt's wife called earlier for results of scan.  Called back & informed of good results per Dr Ernestina Penna message & recommended f/u with PCP if SOB still of concern which could be related to emphysema.  She expressed understanding & appreciation.

## 2019-06-28 ENCOUNTER — Other Ambulatory Visit: Payer: Self-pay | Admitting: Family Medicine

## 2019-07-03 DIAGNOSIS — Z932 Ileostomy status: Secondary | ICD-10-CM | POA: Diagnosis not present

## 2019-07-03 DIAGNOSIS — Z432 Encounter for attention to ileostomy: Secondary | ICD-10-CM | POA: Diagnosis not present

## 2019-07-23 ENCOUNTER — Other Ambulatory Visit: Payer: Self-pay

## 2019-07-23 ENCOUNTER — Ambulatory Visit (INDEPENDENT_AMBULATORY_CARE_PROVIDER_SITE_OTHER): Payer: Medicaid Other | Admitting: Family Medicine

## 2019-07-23 VITALS — BP 115/70 | HR 67

## 2019-07-23 DIAGNOSIS — R0600 Dyspnea, unspecified: Secondary | ICD-10-CM

## 2019-07-23 DIAGNOSIS — R0689 Other abnormalities of breathing: Secondary | ICD-10-CM | POA: Diagnosis not present

## 2019-07-23 DIAGNOSIS — R0609 Other forms of dyspnea: Secondary | ICD-10-CM | POA: Diagnosis not present

## 2019-07-23 DIAGNOSIS — Z720 Tobacco use: Secondary | ICD-10-CM | POA: Diagnosis not present

## 2019-07-23 MED ORDER — NICOTINE 14 MG/24HR TD PT24: 14.0000 mg | MEDICATED_PATCH | Freq: Every day | TRANSDERMAL | 0 refills | Status: DC

## 2019-07-23 MED ORDER — BUPROPION HCL ER (XL) 150 MG PO TB24: 150.0000 mg | ORAL_TABLET | Freq: Every day | ORAL | 1 refills | Status: DC

## 2019-07-23 MED ORDER — ALBUTEROL SULFATE HFA 108 (90 BASE) MCG/ACT IN AERS: 2.0000 | INHALATION_SPRAY | RESPIRATORY_TRACT | 2 refills | Status: DC | PRN

## 2019-07-23 NOTE — Patient Instructions (Signed)
It was great to see you today! Thank you for letting me participate in your care!  Today, we discussed your difficulty breathing and shortness of breath. I believe this is due to you having COPD. I have included more info on this below. First, we need to do Pulmonary Function Testing to see how well your lungs work and if needed we can start medications. The most important thing you can do for your health at this time is to [place]! I know you can do it and I am here to support you in any way I can. I have started you on a new medication that will help with your cravings. Please take it as prescribed.  You will get an appointment with Dr. Valentina Lucks for Pulmonary Function Test and then you will see me in one month.  Be well, Harolyn Rutherford, DO PGY-3, [facility]

## 2019-07-23 NOTE — Progress Notes (Signed)
Subjective: Chief Complaint  Patient presents with  . breathing issues     HPI: [name] is a 48 y.o. presenting to clinic today to discuss the following:  Difficulty Breathing [name] is a 48y/o male with PMH of tobacco use disorder, colon cancer, and anxiety who presents today for difficulty breathing that has become acutely worse in the past 2-3 months. He states he has developed a productive cough with green and yellowish mucous and gets short of breath while doing normal activities such as cutting the grass. He stops and rests and it improves. He denies any orthopnea, PND, or waking from sleep with difficulty breathing. His SOB is exertional but sometimes he states he gets it while sitting down. He denies any chest tightness or chest pain. He is still smoking about 1/2ppd but is motivated to quit.  He denies fever, chills, sore throat, rhinorrhea, abdominal pain, nausea, vomiting, rash, or anxiety today.  ROS noted in HPI.   Past Medical, Surgical, Social, and Family History Reviewed & Updated per EMR.   Pertinent Historical Findings include:   Social History   Tobacco Use  Smoking Status Current Every Day Smoker  . Packs/day: 1.00  . Years: 32.00  . Pack years: 32.00  . Types: Cigarettes  Smokeless Tobacco Never Used   Objective: BP 115/70   Pulse 67   SpO2 98%  Vitals and nursing notes reviewed  Physical Exam Gen: Alert and Oriented x 3, NAD HEENT: Normocephalic, atraumatic, PERRLA, EOMI, TM visible with good light reflex, non-swollen, non-erythematous turbinates, non-erythematous pharyngeal mucosa, no exudates Neck: no LAD CV: RRR, no murmurs, normal S1, S2 split Resp: decreased lung sound bilaterally, wheezing bibasilar lung fields Ext: no clubbing, cyanosis, or edema Neuro: No gross deficits Skin: warm, dry, intact, no rashes  Assessment/Plan:  Tobacco abuse Started Wellbutrin 150mg  daily with 14mg  nicotine patch. - f/u with Dr. Valentina Lucks for more  smoking cessation counseling and resources  Difficulty breathing I suspect patient has COPD 2/2 to chronic tobacco use disorder. His symptoms are not cardiac in nature and he is having no chest pain, no LE swelling, and crackles on exam today making CHF less likely - F/u with Dr. Valentina Lucks for PFTs to assess lung function - Start Albuterol inhaler to have as needed; will add additional medications to SABA if indicated - If no improvement in his symptoms and needing SABA frequently will start LAMA daily with SABA prn - Will also consider getting BNP at next office visit to rule out heart failure  PATIENT EDUCATION PROVIDED: See AVS    Diagnosis and plan along with any newly prescribed medication(s) were discussed in detail with this patient today. The patient verbalized understanding and agreed with the plan. Patient advised if symptoms worsen return to clinic or ER.    Orders Placed This Encounter  Procedures  . Pulmonary function test    Standing Status:   Future    Standing Expiration Date:   09/22/2019    Order Specific Question:   Where should this test be performed?    Answer:   Other    Order Specific Question:   Full PFT: includes the following: basic spirometry, spirometry pre & post bronchodilator, diffusion capacity (DLCO), lung volumes    Answer:   FULL PFT Without spirometry post bronchodilator    Meds ordered this encounter  Medications  . buPROPion (WELLBUTRIN XL) 150 MG 24 hr tablet    Sig: Take 1 tablet (150 mg total) by  mouth daily.    Dispense:  30 tablet    Refill:  1  . nicotine (NICODERM CQ - DOSED IN MG/24 HOURS) 14 mg/24hr patch    Sig: Place 1 patch (14 mg total) onto the skin daily.    Dispense:  28 patch    Refill:  0  . albuterol (VENTOLIN HFA) 108 (90 Base) MCG/ACT inhaler    Sig: Inhale 2 puffs into the lungs every 4 (four) hours as needed for wheezing or shortness of breath.    Dispense:  18 g    Refill:  [place], DO 07/23/2019, 4:10 PM  PGY-3 [place]

## 2019-07-31 ENCOUNTER — Other Ambulatory Visit: Payer: Self-pay

## 2019-07-31 ENCOUNTER — Encounter: Payer: Self-pay | Admitting: Pharmacist

## 2019-07-31 ENCOUNTER — Ambulatory Visit (INDEPENDENT_AMBULATORY_CARE_PROVIDER_SITE_OTHER): Payer: Medicaid Other | Admitting: Pharmacist

## 2019-07-31 DIAGNOSIS — Z720 Tobacco use: Secondary | ICD-10-CM

## 2019-07-31 DIAGNOSIS — R0689 Other abnormalities of breathing: Secondary | ICD-10-CM | POA: Insufficient documentation

## 2019-07-31 NOTE — Progress Notes (Signed)
Patient ID: [name], male   DOB: 02-Feb-1971, 48 y.o.   MRN: [id number] Reviewed: Agree with Dr. Graylin Shiver documentation and management.

## 2019-07-31 NOTE — Patient Instructions (Signed)
Great to meet you today!  Congratulations on quitting for the last four days.   Keep the nicotine patch at the 14 mg dose for at least the remainder the month.   We will plan to call you next week.

## 2019-07-31 NOTE — Progress Notes (Signed)
   S:  Patient arrives ambulating and in good spirits.   Patient arrives for evaluation/assistance with tobacco dependence.   Patient was referred and last seen by Primary Care Provider on 07/23/19.   Age when started using tobacco on a daily basis: 37. Brand smoked Marlboro. Number of cigarettes/day 20. Estimated nicotine content per cigarette (mg) >1.0  Estimated nicotine intake per day > 20mg .   Denies waking to smoke.  Most recent quit attempt 3 years ago Longest time ever been tobacco free 1 year post cancer diagnosis.  Medications used in past cessation efforts include: none  Rates IMPORTANCE of quitting tobacco on 1-10 scale of 10. Rates CONFIDENCE of quitting tobacco on 1-10 scale of 10.  Most common triggers to use tobacco include; after meals, with a coke   Motivation to quit: 49 year old son, father's side of the family all smoked and had smoking related deaths    A/P: Tobacco use disorder with moderate nicotine dependence of 33 years duration in a patient who is good candidate for success because of desire to quit.    -recently initiated by PCP on nicotine replacement tx with Nicotine patch 14mg /24h. Reviewed purpose, proper use, and potential adverse effects, including nightmares   -Provided information on 1 800-QUIT NOW support program.   Written information provided.  F/U phone call: early next week.  F/U Rx Clinic Visit: will discuss with Dr. Garlan Fillers based on how quitting goes. Total time in face-to-face counseling 25 minutes.  Patient seen with Murlean Iba, PharmD Candidate and Lorel Monaco, PharmD PGY-1 Resident.

## 2019-07-31 NOTE — Assessment & Plan Note (Signed)
Started Wellbutrin 150mg  daily with 14mg  nicotine patch. - f/u with Dr. Valentina Lucks for more smoking cessation counseling and resources

## 2019-07-31 NOTE — Assessment & Plan Note (Signed)
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 His symptoms are not cardiac in nature and he is having no chest pain, no LE swelling, and crackles on exam today making CHF less likely - F/u with Dr. Valentina Lucks for PFTs to assess lung function - Start Albuterol inhaler to have as needed; will add additional medications to SABA if indicated - If no improvement in his symptoms and needing SABA frequently will start LAMA daily with SABA prn - Will also consider getting BNP at next office visit to rule out heart failure

## 2019-07-31 NOTE — Assessment & Plan Note (Signed)
Tobacco use disorder with moderate nicotine dependence of 33 years duration in a patient who is good candidate for success because of desire to quit.    -recently initiated by PCP on nicotine replacement tx with Nicotine patch 14mg /24h. Reviewed purpose, proper use, and potential adverse effects, including nightmares   -Provided information on 1 800-QUIT NOW support program.

## 2019-08-06 ENCOUNTER — Telehealth: Payer: Self-pay | Admitting: Pharmacist

## 2019-08-06 NOTE — Telephone Encounter (Signed)
Follow-up call RE tobacco cessation.  Reports complete abstinence for ~ 10 days.  Denies any side effects from the patch. Reports his sleep is "good".  States first thing in the AM continues to be the most difficult time of the day.  Encouraged continued use of nicotine patch 14mg  daily.   Plan follow-up in 1 week by phone.

## 2019-08-06 NOTE — Telephone Encounter (Signed)
-----   Message from Leavy Cella, [facility] sent at 07/31/2019  1:50 PM EDT ----- Regarding: tobacco cessation

## 2019-08-07 ENCOUNTER — Inpatient Hospital Stay: Payer: Medicaid Other | Attending: Hematology

## 2019-08-07 ENCOUNTER — Other Ambulatory Visit: Payer: Self-pay

## 2019-08-07 MED ORDER — GABAPENTIN 300 MG PO CAPS: 600.0000 mg | ORAL_CAPSULE | Freq: Three times a day (TID) | ORAL | 6 refills | Status: DC

## 2019-08-13 ENCOUNTER — Telehealth: Payer: Self-pay | Admitting: Pharmacist

## 2019-08-13 NOTE — Telephone Encounter (Signed)
Phone f/u RE tobacco cessation  Patient reports 13 days without smoking.  Continues to use bupropion + nicotine patch 14mg .  Next visit with PCP, Dr. Garlan Fillers on 9/4 At that time, consideration for reduction from 14mg  to 7mg  patch could be considered.  (He will need a new prescription either way).    First AM  Remains his most difficult time of day (trigger).   I plan next follow by phone ~ 9/14

## 2019-08-24 ENCOUNTER — Ambulatory Visit: Payer: Medicaid Other | Admitting: Family Medicine

## 2019-09-05 ENCOUNTER — Telehealth: Payer: Self-pay | Admitting: Pharmacist

## 2019-09-05 NOTE — Telephone Encounter (Signed)
Patient contacted in follow-up of tobacco cessation.    Reports complete abstinence from smoking. States he is afraid of smoking because of his dyspnea with exertion.  Reports stopping nicotine patch 14mg  about 1 week ago.  He continues to take bupropion 150mg  once daily in the AM  Encouraged continued abstinence and we agree I would contact him again in 2 weeks.  He expressed appreciation for the call.

## 2019-09-20 ENCOUNTER — Telehealth: Payer: Self-pay | Admitting: Pharmacist

## 2019-09-20 NOTE — Telephone Encounter (Signed)
Attempted phone call x2 for tobacco cessation f/u.  Left message I will call back in 3-5 days.

## 2019-09-26 ENCOUNTER — Telehealth: Payer: Self-pay | Admitting: Pharmacist

## 2019-09-26 NOTE — Telephone Encounter (Signed)
Attempted to call RE tobacco cessation.  No answer.   Left message that I would return call again soon.   Plan to call 10/8.

## 2019-09-28 ENCOUNTER — Telehealth: Payer: Self-pay | Admitting: Pharmacist

## 2019-09-28 NOTE — Telephone Encounter (Signed)
Attempted tobacco cessation follow-up.  No answer - multiple attempts.   I ask PCP to refer for additional support with tobacco cessation PRN in the future.    No additional follow-up planned.

## 2019-10-07 NOTE — Progress Notes (Deleted)
[place]   [phone] [phone]   Clinic Follow up Note   Patient Care Team: Nuala Alpha, DO as PCP - General (Family Medicine) Michael Boston, MD as Consulting Physician (General Surgery) [name], Kirke Corin, MD as Consulting Physician (Gastroenterology) Kyung Rudd, MD as Consulting Physician (Radiation Oncology) Truitt Merle, MD as Consulting Physician (Oncology) 10/07/2019  CHIEF COMPLAINT: F/u rectal cancer   SUMMARY OF ONCOLOGIC HISTORY: Oncology History Overview Note  Rectal adenocarcinoma [facility])   Staging form: Colon and Rectum, AJCC 7th Edition   - Clinical stage from 07/28/2016: Stage IIIB (T3, N2a, M0) - Signed by Truitt Merle, MD on 08/06/2016    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  07/26/2016 Imaging   CT chest, abdomen and pelvis with contrast showed nodular thickening of the rectal wall, enlarged perirectal lymph nodes. Diverticulosis. Nonspecific low-level lymphadenopathy. Lower lobe emphysema, small 3-4 mm left lower lobe lung nodules.   07/28/2016 Initial Diagnosis   Rectal adenocarcinoma ([place])   07/28/2016 Initial Biopsy   Rectal mass biopsy showed invasive adenocarcinoma, polyps in the ascending colon and rectum showed tubular adenoma    07/28/2016 Procedure   Colonoscopy showed a nearly circumferential mass in the distal rectum, 4 polyps in the descending colon, diverticulosis in the left colon.    07/29/2016 Procedure   Low EUS showed clearly malignant 6 cm long, nearly circumferential mass in the distal rectum, with distal edge located to 1 cm from the annual approach, multiple prior rectal lymph nodes (6) are suspicious ,uT3 N2    08/17/2016 - 09/24/2016 Radiation Therapy   Neoadjuvant radiation to his rectal cancer 1) Rectum/ 45 Gy in 25 fx                      2) Boost/ 5.4 Gy in 3 fx    08/17/2016 - 09/24/2016 Chemotherapy   Xeloda 1500 mg twice daily with concurrent irradiation   08/18/2016 Imaging   PET scan showed hypermetabolic rectal mass, perirectal node 63m with mild increased uptake, no hypermetabolic distant metastasis, small pulmonary nodules are too small to characterize by PET.    11/29/2016 Imaging   CT of the C/A/P W Contrast IMPRESSION: 1. Interval treatment changes within the perirectal fat with otherwise stable rectosigmoid colon wall thickening. 2. No progressive adenopathy or distant metastases identified. 3. Stable bilateral pulmonary nodules. Short-term stability (for 4 months) is reassuring, although the nodules are too small to optimally characterize by previous PET-CT. Continued CT follow-up recommended.   12/01/2016 Surgery   Colon total resection and proctocolectomy for rectal cancer and Lynch syndrome    12/01/2016 Pathology Results   Proctocolectomy showed scattered small residual foci of invasive moderately differentiated adenocarcinoma embedded in submucosa tissue with extensive neoadjuvant effect, 2.5 cm in greatest time mention, adenocarcinoma invades through muscularis mucosa and involves subserosal soft tissue, margins are negative, 2 of 35 lymph nodes are positive for metastatic adenocarcinoma, 2 calcified and necrotic soft tissue nodules are also present without visible tumor seen.   01/07/2017 - 05/26/2017 Chemotherapy   Adjuvant chemo CAPOX, changed to FOLFOX from cycle 2 due to poor tolerance, a total of 4.5 months therapy     01/26/2017 Surgery   PLACEMENT OF PORT-A-CATH CENTRAL LINE WITH FLUOROSCOPY AND ULTRASOUND   06/21/2017 Imaging   CT Chest w contrast 06/21/2017 IMPRESSION: 1. No evidence of metastatic disease. 2.  Emphysema (ICD10-J43.9). 3. Scattered subpleural pulmonary nodules measure 5 mm or less in size, stable and likely subpleural lymph nodes.  09/21/2017 Procedure   Pouchoscopy by Dr. Marcello Moores on 09/21/17 IMPRESSION  - The examined portion of the ileum was normal. - No specimens collected.   09/22/2017 Surgery   TAKEDOWN OF LOOP  ILEOSTOMY by Dr. Johney Maine on 09/22/17  Diagnosis 09/22/17  Ileostomy, loop SKIN AND COLONIC MUCOSA NEGATIVE FOR CARCINOMA   12/06/2017 Imaging   CT CAP W Contrast 12/06/17 IMPRESSION: 1. Postoperative changes of total proctocolectomy and ileoanal anastomosis without evidence of metastatic disease. Ileoanal anastomosis appears tethered to the sacrum by presacral thickening. 2.  Emphysema (ICD10-J43.9).   01/12/2018 Procedure   Pouchoscopy by Dr. Marcello Moores 01/12/18 - Ileoanal anastomotic stricture found on perianal exam. - No specimens collected. - Erythematous mucosa in the ileoanal pouch. Biopsied.   Pathology results  Diagnosis 01/12/18 Rectum, biopsy, J Pouch mucosa - SMALL BOWEL TYPE MUCOSA WITH CHRONIC INFLAMMATION - NO GRANULOMATA, DYSPLASIA, OR MALIGNANCY IDENTIFIED     CURRENT THERAPY: Surveillance   INTERVAL HISTORY: Mr. Elison returns for f/u as scheduled. He was last seen by Dr. Burr Medico 06/06/19.    REVIEW OF SYSTEMS:   Constitutional: Denies fevers, chills or abnormal weight loss Eyes: Denies blurriness of vision Ears, nose, mouth, throat, and face: Denies mucositis or sore throat Respiratory: Denies cough, dyspnea or wheezes Cardiovascular: Denies palpitation, chest discomfort or lower extremity swelling Gastrointestinal:  Denies nausea, heartburn or change in bowel habits Skin: Denies abnormal skin rashes Lymphatics: Denies new lymphadenopathy or easy bruising Neurological:Denies numbness, tingling or new weaknesses Behavioral/Psych: Mood is stable, no new changes  All other systems were reviewed with the patient and are negative.  MEDICAL HISTORY:  Past Medical History:  Diagnosis Date  . Anxiety   . C. difficile diarrhea 03/30/2018  . Colon cancer ([place]) 12/01/2016  . Depression   . Family history of colon cancer   . 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 In families with MSH6 mutations, screening can start at age 26 years since the onset of colon cancer is later in these families.  Annual screening for endome  . Poor dental hygiene   . Rectal adenocarcinoma s/p protctocolectomy, IPAA "J" pouch 12/01/2016 08/06/2016  . 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 COLON SURGERY    . EUS N/A 07/29/2016   Procedure: LOWER ENDOSCOPIC ULTRASOUND (EUS);  Surgeon: Milus Banister, MD;  Location: [facility];  Service: Endoscopy;  Laterality: N/A;  . ILEOSTOMY CLOSURE N/A 09/22/2017   Procedure: TAKEDOWN OF LOOP ILEOSTOMY;  Surgeon: Michael Boston, MD;  Location: [facility];  Service: General;  Laterality: N/A;  . LYSIS OF ADHESION N/A 05/10/2018   Procedure: LYSIS OF ADHESION;  Surgeon: Michael Boston, MD;  Location: [facility];  Service: General;  Laterality: N/A;  . PORTACATH PLACEMENT N/A 01/26/2017   Procedure: PLACEMENT OF PORT-A-CATH CENTRAL LINE WITH FLUOROSCOPY AND ULTRASOUND;  Surgeon: Michael Boston, MD;  Location: [place];  Service: General;  Laterality: N/A;  . POUCHOSCOPY N/A 09/21/2017   Procedure: POUCHOSCOPY;  Surgeon: Leighton Ruff, MD;  Location: [facility];  Service: Endoscopy;  Laterality: N/A;  . POUCHOSCOPY N/A 01/12/2018   Procedure: POUCHOSCOPY WITH BIOPSIES;  Surgeon: Leighton Ruff, MD;  Location: [facility];  Service: Endoscopy;  Laterality: N/A;  Local  . PROCTOSCOPY N/A 12/01/2016   Procedure: RIGID PROCTOSCOPY;  Surgeon: Michael Boston, MD;  Location: [facility];  Service: General;  Laterality: N/A;  . RECTAL EXAM UNDER ANESTHESIA N/A 05/10/2018   Procedure: RECTAL EXAM UNDER ANESTHESIA;  Surgeon: Michael Boston, MD;  Location: [facility];  Service: General;  Laterality: N/A;  . TOE AMPUTATION Left   . XI ROBOTIC ASSISTED LOWER ANTERIOR RESECTION N/A 12/01/2016   Procedure: XI ROBOTIC ASSISTED PROCTOCOLECTOMY WITH ILLEOPOUCH ANASTAMOSIS WITH DIVERTING ILLEOSTOMY;  Surgeon: Michael Boston, MD;  Location: [facility];  Service: General;  Laterality: N/A;  . XI ROBOTIC ASSISTED LOWER ANTERIOR RESECTION N/A 05/10/2018   Procedure: XI ROBOTIC RESECTION OF ILEAL J POUCH, LYSIS OF ADHESIONS, WITH CREATION OF PERMANENT END ILEOSTOMY.;  Surgeon: Michael Boston, MD;  Location: [facility];  Service: General;  Laterality: N/A;    I have reviewed the social history and family history with the patient and they are unchanged from previous note.  ALLERGIES:  has No Known Allergies.  MEDICATIONS:  Current Outpatient Medications  Medication Sig Dispense Refill  . albuterol (VENTOLIN HFA) 108 (90 Base) MCG/ACT inhaler Inhale 2 puffs into the lungs every 4 (four) hours as needed for wheezing or shortness of breath. 18 g 2  . buPROPion (WELLBUTRIN XL) 150 MG 24 hr tablet Take 1 tablet (150 mg total) by mouth daily. 30 tablet 1  . cyclobenzaprine (FLEXERIL) 10 MG tablet Take 1 tablet (10 mg total) by mouth 3 (three) times daily as needed for muscle spasms. (Patient not taking: Reported on 07/31/2019) 30 tablet 0  . Famotidine (PEPCID AC PO) Take 1 tablet by mouth daily as needed (reflux).    . gabapentin (NEURONTIN) 300 MG capsule Take 2 capsules (600 mg total) by mouth 3 (three) times daily. 180 capsule 6  . ibuprofen (ADVIL,MOTRIN) 200 MG tablet Take 400 mg by mouth every 6 (six) hours as needed (pain.).    Marland Kitchen nicotine (NICODERM CQ - DOSED IN MG/24 HOURS) 14 mg/24hr patch Place 1 patch (14 mg total) onto the skin daily. 28 patch 0  . sertraline (ZOLOFT) 50 MG tablet TAKE 1 TABLET BY MOUTH EVERY DAY 30 tablet 3   No current facility-administered medications for this visit.    Facility-Administered Medications Ordered in Other  Visits  Medication Dose Route Frequency Provider Last Rate Last Dose  . sodium chloride flush (NS) 0.9 % injection 10 mL  10 mL Intravenous PRN Truitt Merle, MD   10 mL at 11/18/17 0953    PHYSICAL EXAMINATION: ECOG PERFORMANCE STATUS: {CHL ONC ECOG PS:[id number]}  There were no vitals filed for this visit. There were no vitals filed for this visit.  GENERAL:alert, no distress and comfortable SKIN: skin color, texture, turgor are normal, no rashes or significant lesions EYES: normal, Conjunctiva are pink and non-injected, sclera clear OROPHARYNX:no exudate, no erythema and lips, buccal mucosa, and tongue normal  NECK: supple, thyroid normal size, non-tender, without nodularity LYMPH:  no palpable lymphadenopathy in the cervical, axillary or inguinal LUNGS: clear to auscultation and percussion with normal breathing effort HEART: regular rate & rhythm and no murmurs and no lower extremity edema ABDOMEN:abdomen soft, non-tender and normal bowel sounds Musculoskeletal:no cyanosis of digits and no clubbing  NEURO: alert & oriented x 3 with fluent speech, no focal motor/sensory deficits  LABORATORY DATA:  I have reviewed the data as listed CBC Latest Ref Rng & Units 06/06/2019  07/31/2018 07/28/2018  WBC 4.0 - 10.5 K/uL 9.2 9.8 8.2  Hemoglobin 13.0 - 17.0 g/dL 16.6 13.2 11.7(L)  Hematocrit 39.0 - 52.0 % 50.7 40.4 35.6(L)  Platelets 150 - 400 K/uL 250 587(H) 433(H)     CMP Latest Ref Rng & Units 06/06/2019 07/31/2018 07/28/2018  Glucose 70 - 99 mg/dL 126(H) 96 -  BUN 6 - 20 mg/dL 12 <5(L) -  Creatinine 0.61 - 1.24 mg/dL 0.89 0.83 0.87  Sodium 135 - 145 mmol/L 139 141 -  Potassium 3.5 - 5.1 mmol/L 4.2 4.0 3.5  Chloride 98 - 111 mmol/L 111 108 -  CO2 22 - 32 mmol/L 21(L) 25 -  Calcium 8.9 - 10.3 mg/dL 7.8(L) 8.5(L) -  Total Protein 6.5 - 8.1 g/dL 5.6(L) 6.3(L) -  Total Bilirubin 0.3 - 1.2 mg/dL <0.2(L) 0.5 -  Alkaline Phos 38 - 126 U/L 60 140(H) -  AST 15 - 41 U/L 20 18 -  ALT 0 - 44 U/L 23  14 -      RADIOGRAPHIC STUDIES: I have personally reviewed the radiological images as listed and agreed with the findings in the report. No results found.   ASSESSMENT & PLAN:  No problem-specific Assessment & Plan notes found for this encounter.   No orders of the defined types were placed in this encounter.  All questions were answered. The patient knows to call the clinic with any problems, questions or concerns. No barriers to learning was detected. I spent {CHL ONC TIME VISIT - SHNGI:[id number]} counseling the patient face to face. The total time spent in the appointment was {CHL ONC TIME VISIT - ZBMZT:[id number]} and more than 50% was on counseling and review of test results     Alla Feeling, NP 10/07/19

## 2019-10-08 ENCOUNTER — Inpatient Hospital Stay: Payer: Medicaid Other

## 2019-10-08 ENCOUNTER — Inpatient Hospital Stay: Payer: Medicaid Other | Admitting: Nurse Practitioner

## 2019-10-08 ENCOUNTER — Inpatient Hospital Stay: Payer: Medicaid Other | Attending: Hematology

## 2019-10-09 ENCOUNTER — Telehealth: Payer: Self-pay | Admitting: Nurse Practitioner

## 2019-10-09 NOTE — Telephone Encounter (Signed)
Called pt per 10/19 sch message- unable to reach pt. Left message or patient to call back

## 2019-10-26 DIAGNOSIS — Z432 Encounter for attention to ileostomy: Secondary | ICD-10-CM | POA: Diagnosis not present

## 2019-10-26 DIAGNOSIS — Z932 Ileostomy status: Secondary | ICD-10-CM | POA: Diagnosis not present

## 2019-11-01 ENCOUNTER — Telehealth: Payer: Self-pay | Admitting: Gastroenterology

## 2019-11-01 NOTE — Telephone Encounter (Signed)
Called [facility] and scheduled the patient for a f/u visit with Dr. Johney Maine on 11/12/2019 at 11:45 am. This was to soonest the patient could get in to see Dr. Johney Maine. Patient notified.

## 2019-11-01 NOTE — Telephone Encounter (Signed)
Dr. Loletha Carrow,   I spoke with [name] who reports he has been having daily rectal bleeding according to him for the past year and a half. He reports specifically the last 3-4 days, the amount and frequency of rectal bleeding has increased but he also endorsed "leaking mucous looking stuff" from the rectum as well. You have availability tomorrow at 1:20 pm. I have held this slot but have not scheduled the patient. I wanted to check with you prior to scheduling a patient next day. Please advise. The patient brought in medical records from Dr. Johney Maine today and requested an appointment. He prefers to see you and not an extender. Please advise.

## 2019-11-01 NOTE — Telephone Encounter (Signed)
I've done a chart review, having not seen this patient since his colonoscopy with me in 07/2016  He actually does not have a rectum after the surgeries he has had done.  The most recent surgery in 2019 to create the new ileostomy also removed the ileal pouch. So the bleeding is from the anal canal.  While I would like to be helpful, that kind of post-surgical anatomy needs to be examined by his surgeon.  I recommend he request an appointment with them ASAP - please help him set that up by contacting [facility].

## 2019-11-02 NOTE — Telephone Encounter (Signed)
[name] - this patient is coming your way soon.  - HD

## 2019-11-20 ENCOUNTER — Encounter: Payer: Medicaid Other | Admitting: Family Medicine

## 2019-12-12 DIAGNOSIS — Z432 Encounter for attention to ileostomy: Secondary | ICD-10-CM | POA: Diagnosis not present

## 2019-12-12 DIAGNOSIS — Z932 Ileostomy status: Secondary | ICD-10-CM | POA: Diagnosis not present

## 2019-12-19 ENCOUNTER — Other Ambulatory Visit: Payer: Self-pay | Admitting: *Deleted

## 2019-12-19 MED ORDER — SERTRALINE HCL 50 MG PO TABS: 50.0000 mg | ORAL_TABLET | Freq: Every day | ORAL | 3 refills | Status: DC

## 2020-01-30 DIAGNOSIS — Z932 Ileostomy status: Secondary | ICD-10-CM | POA: Diagnosis not present

## 2020-01-30 DIAGNOSIS — Z432 Encounter for attention to ileostomy: Secondary | ICD-10-CM | POA: Diagnosis not present

## 2020-02-12 ENCOUNTER — Telehealth: Payer: Self-pay | Admitting: Pharmacist

## 2020-02-12 DIAGNOSIS — Z720 Tobacco use: Secondary | ICD-10-CM

## 2020-02-12 MED ORDER — SERTRALINE HCL 100 MG PO TABS: 100.0000 mg | ORAL_TABLET | Freq: Every day | ORAL | 3 refills | Status: DC

## 2020-02-12 NOTE — Telephone Encounter (Signed)
Patient phone contact to evaluate progress with tobacco cessation.    Patient reports intake on 7 of the last 21 days (only a few days and only a few puffs at a time).  He dislikes the smell and taste of smoking at this point.   He is motivated to quit completely and believes his chance of quitting for the entire month of March is 8/10.   He states his trigger is anxiety.     He is currently taking only 50mg  of sertraline.  We discussed a dose increase and he mentioned it was planned to increase at his last PCP visit.  He agreed that a dose increase trial was reasonable to try minimize his anxiety symptoms.   He plans to take 2 of the 50mg  tablets until gone.  I agreed to send new prescription of 100mg  tablets at this time.   Plan phone follow-up in 1 week to reassess progress.

## 2020-02-12 NOTE — Assessment & Plan Note (Signed)
Patient phone contact to evaluate progress with tobacco cessation.    Patient reports intake on 7 of the last 21 days (only a few days and only a few puffs at a time).  He dislikes the smell and taste of smoking at this point.   He is motivated to quit completely and believes his chance of quitting for the entire month of March is 8/10.   He states his trigger is anxiety.     He is currently taking only 50mg  of sertraline.  We discussed a dose increase and he mentioned it was planned to increase at his last PCP visit.  He agreed that a dose increase trial was reasonable to try minimize his anxiety symptoms.   He plans to take 2 of the 50mg  tablets until gone.  I agreed to send new prescription of 100mg  tablets at this time.

## 2020-02-12 NOTE — Telephone Encounter (Signed)
Noted and agree. 

## 2020-02-20 ENCOUNTER — Telehealth: Payer: Self-pay | Admitting: Pharmacist

## 2020-02-20 DIAGNOSIS — Z720 Tobacco use: Secondary | ICD-10-CM

## 2020-02-20 NOTE — Telephone Encounter (Signed)
Contacted patient RE smoking cessation.    Patient reports 2 cigarettes 3 days ago but no cigarettes for the last 2 days and NONE today.  He has no cigarettes at his house.  He is no longer using NRT patches.   Congratulated on success and encouraged continued progress/abstinence.   We discussed major triggers and avoidance as a "non-smoker".   He expressed confidence in ability to continue with abstinence.    I plan to follow-up in 10 days.   Note:  Anxiety reported as minimal change.  We discussed it will take time for the higher dose of sertraline to work.  He will continue on this at this time.  I hope anxiety will wane with continued use of sertraline 100mg  and decreased withdrawal anxiety as he continues to abstain from tobacco use.   Consider increasing sertraline dose to 150mg  in April if he continues to report anxiety symptoms at that time.

## 2020-02-20 NOTE — Telephone Encounter (Signed)
Reviewed and agree.

## 2020-02-20 NOTE — Assessment & Plan Note (Signed)
Patient reports 2 cigarettes 3 days ago but no cigarettes for the last 2 days and NONE today.  He has no cigarettes at his house.  He is no longer using NRT patches.   Congratulated on success and encouraged continued progress/abstinence.   We discussed major triggers and avoidance as a "non-smoker".   He expressed confidence in ability to continue with abstinence.    I plan to follow-up in 10 days.

## 2020-02-28 ENCOUNTER — Telehealth: Payer: Self-pay | Admitting: Pharmacist

## 2020-02-28 NOTE — Telephone Encounter (Signed)
Attempted to contact RE tobacco cessation.  No answer.   Left message that I would try again soon.

## 2020-03-03 ENCOUNTER — Telehealth: Payer: Self-pay | Admitting: Pharmacist

## 2020-03-03 NOTE — Telephone Encounter (Signed)
Attempted to contact RE tobacco cessation.   No answer, left message requesting call back for update OR  I will attempt call back in 1 week.

## 2020-03-14 DIAGNOSIS — Z432 Encounter for attention to ileostomy: Secondary | ICD-10-CM | POA: Diagnosis not present

## 2020-03-14 DIAGNOSIS — Z932 Ileostomy status: Secondary | ICD-10-CM | POA: Diagnosis not present

## 2020-04-07 DIAGNOSIS — Z932 Ileostomy status: Secondary | ICD-10-CM | POA: Diagnosis not present

## 2020-04-07 DIAGNOSIS — Z432 Encounter for attention to ileostomy: Secondary | ICD-10-CM | POA: Diagnosis not present

## 2020-04-09 ENCOUNTER — Telehealth: Payer: Self-pay | Admitting: Pharmacist

## 2020-04-09 DIAGNOSIS — Z720 Tobacco use: Secondary | ICD-10-CM

## 2020-04-09 NOTE — Telephone Encounter (Signed)
-----   Message from Leavy Cella, [place] sent at 03/20/2020 10:58 AM EDT ----- Regarding: tobacco cessation

## 2020-04-09 NOTE — Telephone Encounter (Signed)
Attempted contact - RE tobacco cessation  Communicated last attempted - until he is seen or contacts [place].  I provided my direct phone contact should he wish to share information or request additional assistance.   No further follow-up planned.  Please route back to me if I can be of further help to this patient.

## 2020-04-09 NOTE — Assessment & Plan Note (Signed)
Attempted contact - RE tobacco cessation  Communicated last attempted - until he is seen or contacts [place].  I provided my direct phone contact should he wish to share information or request additional assistance.   No further follow-up planned.  Please route back to me if I can be of further help to this patient.

## 2020-04-30 ENCOUNTER — Ambulatory Visit: Payer: Medicaid Other | Admitting: Family Medicine

## 2020-05-10 ENCOUNTER — Other Ambulatory Visit: Payer: Self-pay | Admitting: Family Medicine

## 2020-05-15 ENCOUNTER — Encounter: Payer: Medicaid Other | Admitting: Family Medicine

## 2020-06-04 ENCOUNTER — Ambulatory Visit
Admission: RE | Admit: 2020-06-04 | Discharge: 2020-06-04 | Disposition: A | Discharge status: Home / Self Care | Payer: Medicaid Other | Source: Ambulatory Visit | Attending: Family Medicine | Admitting: Family Medicine

## 2020-06-04 ENCOUNTER — Encounter: Payer: Self-pay | Admitting: Family Medicine

## 2020-06-04 ENCOUNTER — Ambulatory Visit (INDEPENDENT_AMBULATORY_CARE_PROVIDER_SITE_OTHER): Payer: Medicaid Other | Admitting: Family Medicine

## 2020-06-04 ENCOUNTER — Other Ambulatory Visit: Payer: Self-pay

## 2020-06-04 VITALS — BP 122/78 | HR 99 | Ht 71.0 in | Wt 182.0 lb

## 2020-06-04 DIAGNOSIS — Z720 Tobacco use: Secondary | ICD-10-CM | POA: Diagnosis not present

## 2020-06-04 DIAGNOSIS — R0689 Other abnormalities of breathing: Secondary | ICD-10-CM

## 2020-06-04 DIAGNOSIS — R0602 Shortness of breath: Secondary | ICD-10-CM | POA: Diagnosis not present

## 2020-06-04 DIAGNOSIS — F411 Generalized anxiety disorder: Secondary | ICD-10-CM

## 2020-06-04 MED ORDER — ALBUTEROL SULFATE HFA 108 (90 BASE) MCG/ACT IN AERS: 2.0000 | INHALATION_SPRAY | RESPIRATORY_TRACT | 2 refills | Status: DC | PRN

## 2020-06-04 MED ORDER — SERTRALINE HCL 100 MG PO TABS: 100.0000 mg | ORAL_TABLET | Freq: Every day | ORAL | 3 refills | Status: DC

## 2020-06-04 MED ORDER — ALBUTEROL SULFATE (2.5 MG/3ML) 0.083% IN NEBU: 2.5000 mg | INHALATION_SOLUTION | Freq: Four times a day (QID) | RESPIRATORY_TRACT | 0 refills | Status: DC | PRN

## 2020-06-04 MED ORDER — PREDNISONE 20 MG PO TABS: 20.0000 mg | ORAL_TABLET | Freq: Two times a day (BID) | ORAL | 0 refills | Status: AC

## 2020-06-04 MED ORDER — SPIRIVA RESPIMAT 2.5 MCG/ACT IN AERS: 2.0000 | INHALATION_SPRAY | Freq: Every day | RESPIRATORY_TRACT | 2 refills | Status: DC

## 2020-06-04 MED ORDER — AZITHROMYCIN 250 MG PO TABS: ORAL_TABLET | ORAL | 0 refills | Status: DC

## 2020-06-04 NOTE — Assessment & Plan Note (Signed)
Restarting Zoloft at 100mg  as he has only been out 1-2 weeks. Discussed importance of calling me if he is out even if he hasn't seen me in awhile. - F/u in one week.

## 2020-06-04 NOTE — Progress Notes (Signed)
    SUBJECTIVE:   CHIEF COMPLAINT / HPI:   Difficulty Breathing Patient presents to clinic today due to continued difficulty breathing that has been ongoing for intermittently for the past few months. He states it has been ongoing for the past several months but last week it "got real bad". He denies chest pain or tightness but is having dyspnea and difficulty breathing. His inhaler works but only for a short time. He has restarted smoking cigarettes and is worried he may have lung cancer.  Anxiety He has been out of his sertraline for two weeks and became anxious of coming in to see the doctor because he was afriad they may find out "something else is wrong". PHQ-9 is high.   Tobacco Cessation Patient has restarted smoking but still motivated to try and quit. Now qualifies for low dose chest CT scan for lung cancer screening.  PERTINENT  PMH / PSH: Anxiety, Hx of Colon Cancer, Depression, Tobacco Use Disorder  OBJECTIVE:   BP 122/78   Pulse 99   Ht 5\' 11"  (1.803 m)   Wt 182 lb (82.6 kg)   SpO2 94%   BMI 25.38 kg/m   Gen: NAD Cardiac: RRR, no murmurs Resp: Bilateral wheezes diffusely but more prominent in bibasilar lung fields, poor air movment diffusely Ext: No edema or swelling of lower extremities bilaterally  ASSESSMENT/PLAN:   Difficulty breathing Most likely a COPD exacerbation but considered CHF and PNA in differential - Prednisone two tablets per day - Azithromycin 2 tablets the first day, then one tablet per day for a total of 5 days. - Use your albuterol inhaler/nebulizer every 2-3 hours as needed.  - Start Spiriva inhaler 2 puffs daily. - BNP to rule out heart failure, CXR and CBC to rule out PNA.  Anxiety state Restarting Zoloft at 100mg  as he has only been out 1-2 weeks. Discussed importance of calling me if he is out even if he hasn't seen me in awhile. - F/u in one week.  Tobacco abuse Message sent to Dr. Valentina Lucks as patient would benefit from PFTs and for  continued smoking cessation counseling.     Nuala Alpha, [place]

## 2020-06-04 NOTE — Patient Instructions (Signed)
It was great to see you today! Thank you for letting me participate in your care!  Today, we discussed your difficulty breathing which is most likely caused by COPD from smoking. Please let me know when you are ready to quit smoking so I can help. It is the most important decision you can make in regards to your health.  I am treating you for a COPD exacerbation. Please take the medications as prescribed for 5 days. Prednisone two tablets per day and Azithromycin 2 tablets the first day, then one tablet per day for a total of 5 days.  Use your albuterol inhaler/nebulizer every 2-3 hours as needed. Please start Spiriva inhaler 2 puffs daily.  Be well, Harolyn Rutherford, DO PGY-3, [facility]

## 2020-06-04 NOTE — Assessment & Plan Note (Addendum)
Most likely a COPD exacerbation but considered CHF and PNA in differential - Prednisone two tablets per day - Azithromycin 2 tablets the first day, then one tablet per day for a total of 5 days. - Use your albuterol inhaler/nebulizer every 2-3 hours as needed.  - Start Spiriva inhaler 2 puffs daily. - BNP to rule out heart failure, CXR and CBC to rule out PNA.

## 2020-06-04 NOTE — Assessment & Plan Note (Signed)
Message sent to Dr. Valentina Lucks as patient would benefit from PFTs and for continued smoking cessation counseling.

## 2020-06-05 ENCOUNTER — Telehealth: Payer: Self-pay

## 2020-06-05 LAB — CBC WITH DIFFERENTIAL/PLATELET
Basophils Absolute: 0.1 10*3/uL (ref 0.0–0.2)
Basos: 1 %
EOS (ABSOLUTE): 0.2 10*3/uL (ref 0.0–0.4)
Eos: 2 %
Hematocrit: 49.9 % (ref 37.5–51.0)
Hemoglobin: 16.6 g/dL (ref 13.0–17.7)
Immature Grans (Abs): 0.3 10*3/uL — ABNORMAL HIGH (ref 0.0–0.1)
Immature Granulocytes: 3 %
Lymphocytes Absolute: 1.2 10*3/uL (ref 0.7–3.1)
Lymphs: 13 %
MCH: 29.2 pg (ref 26.6–33.0)
MCHC: 33.3 g/dL (ref 31.5–35.7)
MCV: 88 fL (ref 79–97)
Monocytes Absolute: 0.7 10*3/uL (ref 0.1–0.9)
Monocytes: 7 %
Neutrophils Absolute: 7 10*3/uL (ref 1.4–7.0)
Neutrophils: 74 %
Platelets: 210 10*3/uL (ref 150–450)
RBC: 5.69 x10E6/uL (ref 4.14–5.80)
RDW: 13.4 % (ref 11.6–15.4)
WBC: 9.5 10*3/uL (ref 3.4–10.8)

## 2020-06-05 LAB — BASIC METABOLIC PANEL
BUN/Creatinine Ratio: 10 (ref 9–20)
BUN: 9 mg/dL (ref 6–24)
CO2: 21 mmol/L (ref 20–29)
Calcium: 8.8 mg/dL (ref 8.7–10.2)
Chloride: 104 mmol/L (ref 96–106)
Creatinine, Ser: 0.9 mg/dL (ref 0.76–1.27)
GFR calc Af Amer: 115 mL/min/{1.73_m2} (ref >59)
GFR calc non Af Amer: 99 mL/min/{1.73_m2} (ref >59)
Glucose: 218 mg/dL — ABNORMAL HIGH (ref 65–99)
Potassium: 4.3 mmol/L (ref 3.5–5.2)
Sodium: 138 mmol/L (ref 134–144)

## 2020-06-05 LAB — BRAIN NATRIURETIC PEPTIDE: BNP: 9.3 pg/mL (ref 0.0–100.0)

## 2020-06-05 NOTE — Telephone Encounter (Signed)
Patient calls nurse line requesting CXR results and next steps. Please advise next steps for patient. I am happy to give detailed message with results and follow up. Thanks  Talbot Grumbling, RN

## 2020-06-11 ENCOUNTER — Other Ambulatory Visit: Payer: Self-pay

## 2020-06-11 ENCOUNTER — Encounter: Payer: Self-pay | Admitting: Family Medicine

## 2020-06-11 ENCOUNTER — Ambulatory Visit (INDEPENDENT_AMBULATORY_CARE_PROVIDER_SITE_OTHER): Payer: Medicaid Other | Admitting: Family Medicine

## 2020-06-11 VITALS — BP 110/70 | HR 75 | Ht 71.0 in | Wt 176.4 lb

## 2020-06-11 DIAGNOSIS — F419 Anxiety disorder, unspecified: Secondary | ICD-10-CM | POA: Diagnosis not present

## 2020-06-11 DIAGNOSIS — Z720 Tobacco use: Secondary | ICD-10-CM

## 2020-06-11 DIAGNOSIS — R0689 Other abnormalities of breathing: Secondary | ICD-10-CM | POA: Diagnosis not present

## 2020-06-11 NOTE — Assessment & Plan Note (Signed)
Still having some anxiety from life stressors and some concerns about his health. However, much improved since his breathing has improved and his chest x-ray is normal. He also feels like restarting Zoloft was good and is not having any difficulty with the current dose of medication. Wants to stay at 100mg  for now. - Cont Zoloft 100mg  daily

## 2020-06-11 NOTE — Progress Notes (Signed)
Patient given PHQ2 and 9.   Provider aware.  .Alazne Quant R Dany Harten, CMA  

## 2020-06-11 NOTE — Progress Notes (Signed)
    SUBJECTIVE:   CHIEF COMPLAINT / HPI:   Follow up for COPD exacerbation Patient has finished antibiotic, steroids, and using his daily inhaler every day as prescribed. His breathing is much improved. He states he feels much better and the coughing has resolved. He also states the feeling of not being able to catch his breath has resolved and he is now able to walk and do his daily routine without getting winded. He has an appointment with Dr. Valentina Lucks for further tobacco use counseling and for spirometry.  Follow up for anxiety His anxiety is still significant with GAD score of 13 but improved overall since last visit and since restarting his Zoloft. He is taking 100mg  daily and feeling like it has his anxiety under control but he continues to have life stressors. We discussed ways to deal with life and family stress that would decrease his anxiety. PHQ-9 score of 2  PERTINENT  PMH / PSH: COPD, Anxiety, tobacco use disorder  OBJECTIVE:   BP 110/70   Pulse 75   Ht 5\' 11"  (1.803 m)   Wt 176 lb 6.4 oz (80 kg)   SpO2 98%   BMI 24.60 kg/m   Gen: NAD Cardiac: RRR, no murmurs Resp: CTAB, fair air movement Psych: improved mood  ASSESSMENT/PLAN:   Tobacco abuse Provided counseling and encouragement to continue to try and quit.  Difficulty breathing Suspect COPD as cause as he has responded well to the medication regimen. - Has follow up with Dr. Valentina Lucks for Spirometry. - Cont Albuterol as needed - Cont Spiriva daily  Anxiety Still having some anxiety from life stressors and some concerns about his health. However, much improved since his breathing has improved and his chest x-ray is normal. He also feels like restarting Zoloft was good and is not having any difficulty with the current dose of medication. Wants to stay at 100mg  for now. - Cont Zoloft 100mg  daily     Nuala Alpha, DO [place]

## 2020-06-11 NOTE — Assessment & Plan Note (Signed)
Provided counseling and encouragement to continue to try and quit.

## 2020-06-11 NOTE — Patient Instructions (Signed)
Declined

## 2020-06-11 NOTE — Assessment & Plan Note (Signed)
Suspect COPD as cause as he has responded well to the medication regimen. - Has follow up with Dr. Valentina Lucks for Spirometry. - Cont Albuterol as needed - Cont Spiriva daily

## 2020-06-17 DIAGNOSIS — Z23 Encounter for immunization: Secondary | ICD-10-CM | POA: Diagnosis not present

## 2020-06-18 ENCOUNTER — Other Ambulatory Visit: Payer: Self-pay

## 2020-06-18 MED ORDER — GABAPENTIN 300 MG PO CAPS: 600.0000 mg | ORAL_CAPSULE | Freq: Three times a day (TID) | ORAL | 6 refills | Status: DC

## 2020-06-19 ENCOUNTER — Ambulatory Visit
Admission: RE | Admit: 2020-06-19 | Discharge: 2020-06-19 | Disposition: A | Discharge status: Home / Self Care | Payer: Medicaid Other | Source: Ambulatory Visit | Attending: Family Medicine | Admitting: Family Medicine

## 2020-06-19 DIAGNOSIS — R0689 Other abnormalities of breathing: Secondary | ICD-10-CM

## 2020-06-19 DIAGNOSIS — J8410 Pulmonary fibrosis, unspecified: Secondary | ICD-10-CM | POA: Diagnosis not present

## 2020-06-19 DIAGNOSIS — R918 Other nonspecific abnormal finding of lung field: Secondary | ICD-10-CM | POA: Diagnosis not present

## 2020-06-19 DIAGNOSIS — J439 Emphysema, unspecified: Secondary | ICD-10-CM | POA: Diagnosis not present

## 2020-06-19 DIAGNOSIS — R911 Solitary pulmonary nodule: Secondary | ICD-10-CM | POA: Diagnosis not present

## 2020-06-24 ENCOUNTER — Telehealth: Payer: Self-pay | Admitting: *Deleted

## 2020-06-24 NOTE — Telephone Encounter (Signed)
Called both numbers and LVM to call office back to inform pt of below and to assist in getting this scheduled.  Looks like pt is scheduled to come in on Thursday 7/8 with Dr. Valentina Lucks.  If he is able maybe we can schedule him before that appointment with someone so he doesn't have to make 2 trips.Jeremey Bascom Zimmerman Rumple, CMA

## 2020-06-24 NOTE — Telephone Encounter (Signed)
-----   Message from Cleophas Dunker, DO sent at 06/24/2020  1:44 PM EDT ----- Can we please call this patient and schedule for an appointment in ATC in the next 1-2 days to discuss CT scan results?  When he is scheduled, can you message me who it is scheduled with because I will need to speak with that provider.  Thanks!

## 2020-06-25 ENCOUNTER — Other Ambulatory Visit: Payer: Self-pay

## 2020-06-25 ENCOUNTER — Encounter: Payer: Self-pay | Admitting: Family Medicine

## 2020-06-25 ENCOUNTER — Ambulatory Visit (INDEPENDENT_AMBULATORY_CARE_PROVIDER_SITE_OTHER): Payer: Medicaid Other | Admitting: Family Medicine

## 2020-06-25 VITALS — BP 120/80 | HR 81 | Ht 71.0 in | Wt 177.2 lb

## 2020-06-25 DIAGNOSIS — Z712 Person consulting for explanation of examination or test findings: Secondary | ICD-10-CM | POA: Insufficient documentation

## 2020-06-25 NOTE — Assessment & Plan Note (Signed)
CT chest shows interval development of multiple small nodules in the lung compared to the CT scan 1 year ago.  Concern for metastatic disease suspected.  Discussed with patient and his wife that there is concerned that these could be cancer.  His wife is very tearful.  Patient reported that he felt as though he knew that this was the case as he has been dealing with this for quite some time.  Try to call Dr. Ernestina Penna office, as this is his established oncologist, to schedule an appointment.  Their office was closed.  Advised her that they call office first thing in the morning to ensure follow-up so that they can discuss further steps.  Patient and his wife were thankful and advised that they will call first thing tomorrow.  We will follow-up with patient in 4 weeks to ensure that proper follow-up is occurring.  Advised that as new PCP, can be contacted for any questions or concerns.  Will forward this note to Dr. Burr Medico.

## 2020-06-25 NOTE — Telephone Encounter (Signed)
Patient's significant other, Myriam Jacobson, returns call to nurse line from Dr. Gwendlyn Deutscher. Informed patient that patient needs to be scheduled for an appointment to discuss recent imaging results. Myriam Jacobson requesting to speak with Dr. Gwendlyn Deutscher prior to scheduling office visit. Messaged provider who advised scheduling patient in access to care and routing message to PCP.   Patient scheduled for tomorrow afternoon in ATC with Dr. Ouida Sills.   Will forward to PCP and Dr. Ouida Sills.   Talbot Grumbling, RN

## 2020-06-25 NOTE — Patient Instructions (Signed)
Thank you for coming to see me today. It was a pleasure. Today we talked about:   Please call Dr. Ernestina Penna office tomorrow at [phone] to schedule an appointment.  I will also send her the note from today to let her know.  Please follow-up with me in 4 weeks.  If you have any questions or concerns, please do not hesitate to call the office at [phone].  Best,   [place] Constable, DO

## 2020-06-25 NOTE — Progress Notes (Signed)
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 male in NAD Lungs: No increased work of breathing on room air Extremities: Ambulating without difficulty Psych: Mood and affect appropriate for circumstance   ASSESSMENT/PLAN:   Encounter to discuss test results CT chest shows interval development of multiple small nodules in the lung compared to the CT scan 1 year ago.  Concern for metastatic disease suspected.  Discussed with patient and his wife that there is concerned that these could be cancer.  His wife is very tearful.  Patient reported that he felt as though he knew that this was the case as he has been dealing with this for quite some time.  Try to call Dr. Ernestina Penna office, as this is his established oncologist, to schedule an appointment.  Their office was closed.  Advised her that they call office first thing in the morning to ensure follow-up so that they can discuss further steps.  Patient and his wife were thankful and advised that they will call first thing tomorrow.  We will follow-up with patient in 4 weeks to ensure that proper follow-up is occurring.  Advised that as new PCP, can be contacted for any questions or concerns.  Will forward this note to Dr. Burr Medico.     Cleophas Dunker, [place]

## 2020-06-25 NOTE — Telephone Encounter (Signed)
LVM on both numbers for pt to call office to see about getting him an appointment per Dr. Sandi Carne. See below message about current appointment with Dr. Neita Carp Rumple, CMA

## 2020-06-25 NOTE — Telephone Encounter (Signed)
I called both numbers listed to help him schedule PCP f/u to discussed CT chest ordered by Dr. Garlan Fillers. However, I was unable to reach him.  HIPAA compliant callback message left. Should he return the call, please help him schedule a PCP/ATC appointment to discuss CT chest result. Thanks.

## 2020-06-25 NOTE — Telephone Encounter (Signed)
Patient came to see me today and is aware of results.  Thank you all for the team effort in reaching him!

## 2020-06-26 ENCOUNTER — Inpatient Hospital Stay (HOSPITAL_BASED_OUTPATIENT_CLINIC_OR_DEPARTMENT_OTHER): Payer: Medicaid Other | Admitting: Hematology

## 2020-06-26 ENCOUNTER — Other Ambulatory Visit: Payer: Self-pay

## 2020-06-26 ENCOUNTER — Encounter: Payer: Self-pay | Admitting: Hematology

## 2020-06-26 ENCOUNTER — Ambulatory Visit: Payer: Medicaid Other | Admitting: Pharmacist

## 2020-06-26 ENCOUNTER — Ambulatory Visit: Payer: Medicaid Other

## 2020-06-26 ENCOUNTER — Inpatient Hospital Stay: Payer: Medicaid Other | Attending: Hematology

## 2020-06-26 ENCOUNTER — Inpatient Hospital Stay: Payer: Medicaid Other

## 2020-06-26 ENCOUNTER — Telehealth: Payer: Self-pay

## 2020-06-26 VITALS — BP 110/88 | HR 86 | Temp 98.1°F | Resp 18 | Ht 71.0 in | Wt 179.4 lb

## 2020-06-26 DIAGNOSIS — Z85038 Personal history of other malignant neoplasm of large intestine: Secondary | ICD-10-CM | POA: Insufficient documentation

## 2020-06-26 DIAGNOSIS — F329 Major depressive disorder, single episode, unspecified: Secondary | ICD-10-CM | POA: Insufficient documentation

## 2020-06-26 DIAGNOSIS — C20 Malignant neoplasm of rectum: Secondary | ICD-10-CM | POA: Insufficient documentation

## 2020-06-26 DIAGNOSIS — J439 Emphysema, unspecified: Secondary | ICD-10-CM | POA: Diagnosis not present

## 2020-06-26 DIAGNOSIS — Z800 Family history of malignant neoplasm of digestive organs: Secondary | ICD-10-CM | POA: Insufficient documentation

## 2020-06-26 DIAGNOSIS — F419 Anxiety disorder, unspecified: Secondary | ICD-10-CM | POA: Diagnosis not present

## 2020-06-26 DIAGNOSIS — F17210 Nicotine dependence, cigarettes, uncomplicated: Secondary | ICD-10-CM | POA: Diagnosis not present

## 2020-06-26 DIAGNOSIS — Z1509 Genetic susceptibility to other malignant neoplasm: Secondary | ICD-10-CM | POA: Diagnosis not present

## 2020-06-26 DIAGNOSIS — R918 Other nonspecific abnormal finding of lung field: Secondary | ICD-10-CM | POA: Insufficient documentation

## 2020-06-26 LAB — CMP (CANCER CENTER ONLY)
ALT: 40 U/L (ref 0–44)
AST: 30 U/L (ref 15–41)
Albumin: 2.8 g/dL — ABNORMAL LOW (ref 3.5–5.0)
Alkaline Phosphatase: 55 U/L (ref 38–126)
Anion gap: 7 (ref 5–15)
BUN: 9 mg/dL (ref 6–20)
CO2: 24 mmol/L (ref 22–32)
Calcium: 8.8 mg/dL — ABNORMAL LOW (ref 8.9–10.3)
Chloride: 108 mmol/L (ref 98–111)
Creatinine: 0.85 mg/dL (ref 0.61–1.24)
GFR, Est AFR Am: >60 mL/min (ref >60)
GFR, Estimated: >60 mL/min (ref >60)
Glucose, Bld: 139 mg/dL — ABNORMAL HIGH (ref 70–99)
Potassium: 4.2 mmol/L (ref 3.5–5.1)
Sodium: 139 mmol/L (ref 135–145)
Total Bilirubin: 0.2 mg/dL — ABNORMAL LOW (ref 0.3–1.2)
Total Protein: 5.6 g/dL — ABNORMAL LOW (ref 6.5–8.1)

## 2020-06-26 LAB — CBC WITH DIFFERENTIAL (CANCER CENTER ONLY)
Abs Immature Granulocytes: 0.27 10*3/uL — ABNORMAL HIGH (ref 0.00–0.07)
Basophils Absolute: 0.1 10*3/uL (ref 0.0–0.1)
Basophils Relative: 1 %
Eosinophils Absolute: 0.2 10*3/uL (ref 0.0–0.5)
Eosinophils Relative: 3 %
HCT: 50 % (ref 39.0–52.0)
Hemoglobin: 16.7 g/dL (ref 13.0–17.0)
Immature Granulocytes: 4 %
Lymphocytes Relative: 20 %
Lymphs Abs: 1.4 10*3/uL (ref 0.7–4.0)
MCH: 29.5 pg (ref 26.0–34.0)
MCHC: 33.4 g/dL (ref 30.0–36.0)
MCV: 88.3 fL (ref 80.0–100.0)
Monocytes Absolute: 0.5 10*3/uL (ref 0.1–1.0)
Monocytes Relative: 7 %
Neutro Abs: 4.6 10*3/uL (ref 1.7–7.7)
Neutrophils Relative %: 65 %
Platelet Count: 223 10*3/uL (ref 150–400)
RBC: 5.66 MIL/uL (ref 4.22–5.81)
RDW: 13.7 % (ref 11.5–15.5)
WBC Count: 7 10*3/uL (ref 4.0–10.5)
nRBC: 0 % (ref 0.0–0.2)

## 2020-06-26 LAB — CEA (IN HOUSE-CHCC): CEA (CHCC-In House): 1.75 ng/mL (ref 0.00–5.00)

## 2020-06-26 NOTE — Progress Notes (Signed)
[name]   Telephone:([phone]) [phone] Fax:([phone]) [phone]   Clinic Follow up Note   Patient Care Team: Meccariello, Bernita Raisin, DO as PCP - General (Family Medicine) Michael Boston, MD as Consulting Physician (General Surgery) [name], Kirke Corin, MD as Consulting Physician (Gastroenterology) Kyung Rudd, MD as Consulting Physician (Radiation Oncology) Truitt Merle, MD as Consulting Physician (Oncology)  Date of Service:  06/26/2020  CHIEF COMPLAINT: Follow up rectal cancer  SUMMARY OF ONCOLOGIC HISTORY: Oncology History Overview Note  Rectal adenocarcinoma [facility])   Staging form: Colon and Rectum, AJCC 7th Edition   - Clinical stage from 07/28/2016: Stage IIIB (T3, N2a, M0) - Signed by Truitt Merle, MD on 08/06/2016    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  07/26/2016 Imaging   CT chest, abdomen and pelvis with contrast showed nodular thickening of the rectal wall, enlarged perirectal lymph nodes. Diverticulosis. Nonspecific low-level lymphadenopathy. Lower lobe emphysema, small 3-4 mm left lower lobe lung nodules.   07/28/2016 Initial Diagnosis   Rectal adenocarcinoma ([place])   07/28/2016 Initial Biopsy   Rectal mass biopsy showed invasive adenocarcinoma, polyps in the ascending colon and rectum showed tubular adenoma    07/28/2016 Procedure   Colonoscopy showed a nearly circumferential mass in the distal rectum, 4 polyps in the descending colon, diverticulosis in the left colon.    07/29/2016 Procedure   Low EUS showed clearly malignant 6 cm long, nearly circumferential mass in the distal rectum, with distal edge located to 1 cm from the annual approach, multiple prior rectal lymph nodes (6) are suspicious ,uT3 N2    08/17/2016 - 09/24/2016 Radiation Therapy   Neoadjuvant radiation to his rectal cancer 1) Rectum/ 45 Gy in 25 fx                      2) Boost/ 5.4 Gy in 3 fx    08/17/2016 - 09/24/2016 Chemotherapy   Xeloda 1500 mg twice daily with concurrent  irradiation   08/18/2016 Imaging   PET scan showed hypermetabolic rectal mass, perirectal node 66m with mild increased uptake, no hypermetabolic distant metastasis, small pulmonary nodules are too small to characterize by PET.    11/29/2016 Imaging   CT of the C/A/P W Contrast IMPRESSION: 1. Interval treatment changes within the perirectal fat with otherwise stable rectosigmoid colon wall thickening. 2. No progressive adenopathy or distant metastases identified. 3. Stable bilateral pulmonary nodules. Short-term stability (for 4 months) is reassuring, although the nodules are too small to optimally characterize by previous PET-CT. Continued CT follow-up recommended.   12/01/2016 Surgery   Colon total resection and proctocolectomy for rectal cancer and Lynch syndrome    12/01/2016 Pathology Results   Proctocolectomy showed scattered small residual foci of invasive moderately differentiated adenocarcinoma embedded in submucosa tissue with extensive neoadjuvant effect, 2.5 cm in greatest time mention, adenocarcinoma invades through muscularis mucosa and involves subserosal soft tissue, margins are negative, 2 of 35 lymph nodes are positive for metastatic adenocarcinoma, 2 calcified and necrotic soft tissue nodules are also present without visible tumor seen.   01/07/2017 - 05/26/2017 Chemotherapy   Adjuvant chemo CAPOX, changed to FOLFOX from cycle 2 due to poor tolerance, a total of 4.5 months therapy     01/26/2017 Surgery   PLACEMENT OF PORT-A-CATH CENTRAL LINE WITH FLUOROSCOPY AND ULTRASOUND   06/21/2017 Imaging   CT Chest w contrast 06/21/2017 IMPRESSION: 1. No evidence of metastatic disease. 2.  Emphysema (ICD10-J43.9). 3. Scattered subpleural pulmonary nodules measure 5 mm or less  in size, stable and likely subpleural lymph nodes.   09/21/2017 Procedure   Pouchoscopy by Dr. Marcello Moores on 09/21/17 IMPRESSION  - The examined portion of the ileum was normal. - No specimens collected.     09/22/2017 Surgery   TAKEDOWN OF LOOP ILEOSTOMY by Dr. Johney Maine on 09/22/17  Diagnosis 09/22/17  Ileostomy, loop SKIN AND COLONIC MUCOSA NEGATIVE FOR CARCINOMA   12/06/2017 Imaging   CT CAP W Contrast 12/06/17 IMPRESSION: 1. Postoperative changes of total proctocolectomy and ileoanal anastomosis without evidence of metastatic disease. Ileoanal anastomosis appears tethered to the sacrum by presacral thickening. 2.  Emphysema (ICD10-J43.9).   01/12/2018 Procedure   Pouchoscopy by Dr. Marcello Moores 01/12/18 - Ileoanal anastomotic stricture found on perianal exam. - No specimens collected. - Erythematous mucosa in the ileoanal pouch. Biopsied.   Pathology results  Diagnosis 01/12/18 Rectum, biopsy, J Pouch mucosa - SMALL BOWEL TYPE MUCOSA WITH CHRONIC INFLAMMATION - NO GRANULOMATA, DYSPLASIA, OR MALIGNANCY IDENTIFIED   06/25/2019 Imaging   CT CAP IMPRESSION: 1. Low anterior section and permanent right sided ileostomy, without evidence of recurrent or metastatic disease. Probable postoperative seroma in the presacral space, stable. 2. Mild hepatomegaly. 3. Cholelithiasis. 4.  Emphysema (ICD10-J43.9).   06/19/2020 Imaging   CT Chest  IMPRESSION: 1. Interval development of multiple small nodules in the lungs compared with 1 year ago. Given history of rectal cancer, metastatic disease is suspected. Most these nodules are very small and continued close follow-up is warranted. 2. Moderate emphysema      CURRENT THERAPY:  Surveillance  INTERVAL HISTORY:  [name] is here for a follow up of rectal cancer. He was last seen by me 1 year ago. He lost follow up after 05/2019. He presents to the clinic with his family member. His family member notes he had concern his cancer has returned but apprehensive about returning to clinic. He notes he his PCP ordered CT chest for him due to his worsened SOB. He still has SOB upon exertion. He notes has productive cough. He has not some difficulty doing  daily activities. He will get fatigued easily. His SOB has worsened slowly in the past 6 months. He notes he is eating well and her regular BM, no significant weight loss.    REVIEW OF SYSTEMS:   Constitutional: Denies fevers, chills or abnormal weight loss (+) Fatigue  Eyes: Denies blurriness of vision Ears, nose, mouth, throat, and face: Denies mucositis or sore throat Respiratory: (+) SOB upon mild exertion (+) Productive cough  Cardiovascular: Denies palpitation, chest discomfort or lower extremity swelling Gastrointestinal:  Denies nausea, heartburn or change in bowel habits Skin: Denies abnormal skin rashes Lymphatics: Denies new lymphadenopathy or easy bruising Neurological:Denies numbness, tingling or new weaknesses Behavioral/Psych: Mood is stable, no new changes  All other systems were reviewed with the patient and are negative.  MEDICAL HISTORY:  Past Medical History:  Diagnosis Date   Anxiety    C. 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 In families with MSH6 mutations, screening can start at age 30 years since the onset of colon cancer is later in these families.  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    SURGICAL HISTORY: Past Surgical History:  Procedure Laterality Date   COLON SURGERY     EUS N/A 07/29/2016   Procedure: LOWER ENDOSCOPIC ULTRASOUND (EUS);  Surgeon:  Milus Banister, MD;  Location: [facility];  Service: Endoscopy;  Laterality: N/A;   ILEOSTOMY CLOSURE N/A 09/22/2017   Procedure: TAKEDOWN OF LOOP ILEOSTOMY;  Surgeon: Michael Boston, MD;  Location: [facility];  Service: General;  Laterality: N/A;   LYSIS OF ADHESION N/A 05/10/2018   Procedure: LYSIS OF ADHESION;  Surgeon: Michael Boston, MD;  Location: [facility];  Service: General;  Laterality: N/A;   PORTACATH PLACEMENT N/A 01/26/2017   Procedure: PLACEMENT OF PORT-A-CATH CENTRAL LINE WITH FLUOROSCOPY AND ULTRASOUND;  Surgeon: Michael Boston, MD;  Location: [place];  Service: General;  Laterality: N/A;   POUCHOSCOPY N/A 09/21/2017   Procedure: POUCHOSCOPY;  Surgeon: Leighton Ruff, MD;  Location: [facility];  Service: Endoscopy;  Laterality: N/A;   POUCHOSCOPY N/A 01/12/2018   Procedure: POUCHOSCOPY WITH BIOPSIES;  Surgeon: Leighton Ruff, MD;  Location: [facility];  Service: Endoscopy;  Laterality: N/A;  Local   PROCTOSCOPY N/A 12/01/2016   Procedure: RIGID PROCTOSCOPY;  Surgeon: Michael Boston, MD;  Location: [facility];  Service: General;  Laterality: N/A;   RECTAL EXAM UNDER ANESTHESIA N/A 05/10/2018   Procedure: RECTAL EXAM UNDER ANESTHESIA;  Surgeon: Michael Boston, MD;  Location: [facility];  Service: General;  Laterality: N/A;   TOE AMPUTATION Left    XI ROBOTIC ASSISTED LOWER ANTERIOR RESECTION N/A 12/01/2016   Procedure: XI ROBOTIC ASSISTED PROCTOCOLECTOMY WITH ILLEOPOUCH ANASTAMOSIS WITH DIVERTING ILLEOSTOMY;  Surgeon: Michael Boston, MD;  Location: [facility];  Service: General;  Laterality: N/A;   XI ROBOTIC ASSISTED LOWER ANTERIOR RESECTION N/A 05/10/2018   Procedure: XI ROBOTIC RESECTION OF ILEAL J POUCH, LYSIS OF ADHESIONS, WITH CREATION OF PERMANENT END ILEOSTOMY.;  Surgeon: Michael Boston, MD;  Location: [facility];  Service: General;  Laterality: N/A;    I have reviewed the social history and family history with the patient and they are unchanged from previous  note.  ALLERGIES:  has No Known Allergies.  MEDICATIONS:  Current Outpatient Medications  Medication Sig Dispense Refill   albuterol (PROVENTIL) (2.5 MG/3ML) 0.083% nebulizer solution Take 3 mLs (2.5 mg total) by nebulization every 6 (six) hours as needed for wheezing or shortness of breath. 75 mL 0   albuterol (VENTOLIN HFA) 108 (90 Base) MCG/ACT inhaler Inhale 2 puffs into the lungs every 4 (four) hours as needed for wheezing or shortness of breath. 18 g 2   gabapentin (NEURONTIN) 300 MG capsule Take 2 capsules (600 mg total) by mouth 3 (three) times daily. 180 capsule 6   sertraline (ZOLOFT) 100 MG tablet Take 1 tablet (100 mg total) by mouth daily. 30 tablet 3   Tiotropium Bromide Monohydrate (SPIRIVA RESPIMAT) 2.5 MCG/ACT AERS Inhale 2 puffs into the lungs daily. 4 g 2   No current facility-administered medications for this visit.   Facility-Administered Medications Ordered in Other Visits  Medication Dose Route Frequency Provider Last Rate Last Admin   sodium chloride flush (NS) 0.9 % injection 10 mL  10 mL Intravenous PRN Truitt Merle, MD   10 mL at 11/18/17 0953    PHYSICAL EXAMINATION: ECOG PERFORMANCE STATUS: 3 -  Symptomatic, >50% confined to bed  Vitals:   06/26/20 1354  BP: 110/88  Pulse: 86  Resp: 18  Temp: 98.1 F (36.7 C)  SpO2: 97%   Filed Weights   06/26/20 1354  Weight: 179 lb 6.4 oz (81.4 kg)    GENERAL:alert, no distress and comfortable SKIN: skin color, texture, turgor are normal, no rashes or significant lesions EYES: normal, Conjunctiva are pink and non-injected, sclera clear  NECK: supple, thyroid normal size, non-tender, without nodularity LYMPH:  no palpable lymphadenopathy in the cervical, axillary  LUNGS: clear to auscultation and percussion (+) Decreased breath sounds HEART: regular rate & rhythm and no murmurs and no lower extremity edema ABDOMEN:abdomen soft, non-tender and normal bowel sounds Musculoskeletal:no cyanosis of digits and no  clubbing  NEURO: alert & oriented x 3 with fluent speech, no focal motor/sensory deficits  LABORATORY DATA:  I have reviewed the data as listed CBC Latest Ref Rng & Units 06/26/2020 06/04/2020 06/06/2019  WBC 4.0 - 10.5 K/uL 7.0 9.5 9.2  Hemoglobin 13.0 - 17.0 g/dL 16.7 16.6 16.6  Hematocrit 39 - 52 % 50.0 49.9 50.7  Platelets 150 - 400 K/uL 223 210 250     CMP Latest Ref Rng & Units 06/26/2020 06/04/2020 06/06/2019  Glucose 70 - 99 mg/dL 139(H) 218(H) 126(H)  BUN 6 - 20 mg/dL _0 Creatinine 0.61 - 1.24 mg/dL 0.85 0.90 0.89  Sodium 135 - 145 mmol/L 139 138 139  Potassium 3.5 - 5.1 mmol/L 4.2 4.3 4.2  Chloride 98 - 111 mmol/L 108 104 111  CO2 22 - 32 mmol/L 24 21 21(L)  Calcium 8.9 - 10.3 mg/dL 8.8(L) 8.8 7.8(L)  Total Protein 6.5 - 8.1 g/dL 5.6(L) - 5.6(L)  Total Bilirubin 0.3 - 1.2 mg/dL 0.2(L) - <0.2(L)  Alkaline Phos 38 - 126 U/L 55 - 60  AST 15 - 41 U/L 30 - 20  ALT 0 - 44 U/L 40 - 23      RADIOGRAPHIC STUDIES: I have personally reviewed the radiological images as listed and agreed with the findings in the report. No results found.   ASSESSMENT & PLAN:  TAHJE BORAWSKI is a 49 y.o. male with    1. 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 He was treated with neoadjuvant concurrent chemoRT, colon surgery, adjuvant chemo and ileostomy reversal.  -He had ileostomy placed again due to uncontrolled diarrhea. It's manageable now  -He lost follow up after 05/2019. His CT CAP at the time (06/25/19) showed no evidence of cancer recurrence or metastatic disease.  -For the past 6 months his SOB and cough has progressed. His PCP had him undergo CT Chest from 06/19/20 shows Interval development of multiple small nodules in the lungs compared with 1 year ago. Given history of rectal cancer, metastatic disease is suspected. Lung primary is less likely given presentation.  -I discussed if this is  metastasis from his rectal cancer or lung primary, his cancer is stage IV and no longer curable but still treatable to control his disease and prolong his life.  -I discussed these lung nodules are too small to biopsy at this time and may be too small and will below the threshold of PET scan.  However if his lung nodules are hypermetabolic on PET, then they are likely malignant. He is agreeable.  -I also discussed the option of GuardantReveal Testing which is a pilot study to detect tumor cells in  circulation. If the test is positive this could aid in evidence this is related to his cancer.  -If there is not enough evidence his lung nodules are related to malignancy we would observe with repeat scans in 3 months  -Labs today shows CBC and CMP WNL except BG 139, Ca 8.8, protein 5.6, albumin 2.8. CEA is still pending.  -He still has PAC in place which has not been flushed in months. He tried before and was hard to access, will try to flush today.  -His wife had many questions prognosis of metastatic colon cancer, and the benefit of treatment, I answered all to her satisfaction. -F/u in 2 weeks    2. Dyspnea on exertion, Cough, Emphysema   -Established in early 2020 with SOB with mild to moderate exertion. Labs at that time did not show anemia.  -He has a history of smoking and is currently still smokes 1ppd. He has known cough which he attributed to smoking.  -In the past 6 months (first half of 2021) his SOB has progressed along with productive cough which has significantly decreased his energy and activity level.  -His PCP ordered a CT chest on 06/19/20 which shows interval development of multiple small b/l pulmonary nodules compared to last year. There is concern for metastatic disease. Scan also shows moderate emphysema.  -He has clear lungs but decreased breaths sounds on exam today (06/26/20).  -I discussed his smoking history is also related to his symptoms, so I will refer him to Pulmonologist. He  is agreeable.  -I again discussed smoking cessation. He understands but notes it is hard to quit at this point.    3. 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 -He has undergone total colectomy and proctocolectomy, his risk of secondary colon cancer is minimal -I previously discussedwith the ptother cancer risks from age syndrome, I previously recommend him to have EGD every 3-5 years, annual urinalysis for GU cancer screening.  -He has sibling but one brother has not been checked. His son is a teenager. I encouraged him to have him checked at adult age.   4. Anxiety, Depression, Social issues  -His family member notes he has been nervous about cancer recurrence. He notes smoking helps him but understands he should quit.  -He is out of work, on disability   Plan -CT chest reviewed, he has developed a bilateral small nodules since last scan 1 year ago, which is suspicious for metastatic disease. -Port flush today -PET scan in 1-2 weeks -Send pulmonologist referral  - F/u in 2 weeks    No problem-specific Assessment & Plan notes found for this encounter.   Orders Placed This Encounter  Procedures   NM PET Image Restag (PS) Skull Base To Thigh    Evaluate metastatic disease, multiple small new lung nodules on recent CT chest    Standing Status:   Future    Standing Expiration Date:   06/26/2021    Order Specific Question:   If indicated for the ordered procedure, I authorize the administration of a radiopharmaceutical per Radiology protocol    Answer:   Yes    Order Specific Question:   Preferred imaging location?    Answer:   [facility]    Order Specific Question:   Radiology Contrast Protocol - do NOT remove file path    Answer:   [email or web address]   Guardant 360  Standing Status:   Future    Number of Occurrences:   1     Standing Expiration Date:   06/26/2021   Ambulatory referral to Pulmonology    Referral Priority:   Routine    Referral Type:   Consultation    Referral Reason:   Specialty Services Required    Requested Specialty:   Pulmonary Disease    Number of Visits Requested:   1   All questions were answered. The patient knows to call the clinic with any problems, questions or concerns. No barriers to learning was detected. The total time spent in the appointment was 40 minutes.     Truitt Merle, MD 06/26/2020   I, [name], am acting as scribe for Truitt Merle, MD.   I have reviewed the above documentation for accuracy and completeness, and I agree with the above.

## 2020-06-26 NOTE — Telephone Encounter (Signed)
I spoke with [name], appt for lab and Dr. Burr Medico f/u made for today.  She verbalized understanding.

## 2020-06-26 NOTE — Telephone Encounter (Signed)
Mr Trefz, Retia Passe called stating he had Ct of the chest ordered by his PCP.  This has shown lung nodules.  They are calling for appt.

## 2020-06-27 ENCOUNTER — Telehealth: Payer: Self-pay | Admitting: Hematology

## 2020-06-27 NOTE — Telephone Encounter (Signed)
Scheduled per 7/8 los. Spoke with pt's wife and is aware of appt.

## 2020-07-01 ENCOUNTER — Other Ambulatory Visit: Payer: Self-pay | Admitting: Family Medicine

## 2020-07-02 ENCOUNTER — Telehealth: Payer: Self-pay

## 2020-07-02 ENCOUNTER — Other Ambulatory Visit: Payer: Self-pay | Admitting: Hematology

## 2020-07-02 DIAGNOSIS — C20 Malignant neoplasm of rectum: Secondary | ICD-10-CM

## 2020-07-02 NOTE — Telephone Encounter (Signed)
I spoke with [name] and let her know the insurance would not authorize the PET scan.  I told her Dr. Burr Medico order a CT scan of the abd/pelvis and provided her the number for scheduling.  She verbalized understanding.

## 2020-07-03 ENCOUNTER — Encounter: Payer: Self-pay | Admitting: Family Medicine

## 2020-07-07 ENCOUNTER — Ambulatory Visit (HOSPITAL_COMMUNITY): Payer: Medicaid Other

## 2020-07-07 ENCOUNTER — Other Ambulatory Visit: Payer: Self-pay

## 2020-07-07 ENCOUNTER — Ambulatory Visit (HOSPITAL_COMMUNITY)
Admission: RE | Admit: 2020-07-07 | Discharge: 2020-07-07 | Disposition: A | Discharge status: Home / Self Care | Payer: Medicaid Other | Source: Ambulatory Visit | Attending: Hematology | Admitting: Hematology

## 2020-07-07 DIAGNOSIS — C20 Malignant neoplasm of rectum: Secondary | ICD-10-CM

## 2020-07-07 DIAGNOSIS — K8020 Calculus of gallbladder without cholecystitis without obstruction: Secondary | ICD-10-CM | POA: Diagnosis not present

## 2020-07-07 DIAGNOSIS — C19 Malignant neoplasm of rectosigmoid junction: Secondary | ICD-10-CM | POA: Diagnosis not present

## 2020-07-07 DIAGNOSIS — K3189 Other diseases of stomach and duodenum: Secondary | ICD-10-CM | POA: Diagnosis not present

## 2020-07-07 DIAGNOSIS — M4319 Spondylolisthesis, multiple sites in spine: Secondary | ICD-10-CM | POA: Diagnosis not present

## 2020-07-07 MED ORDER — SODIUM CHLORIDE (PF) 0.9 % IJ SOLN
INTRAMUSCULAR | Status: AC
  Filled 2020-07-07: qty 50

## 2020-07-07 MED ORDER — IOHEXOL 300 MG/ML  SOLN
100.0000 mL | Freq: Once | INTRAMUSCULAR | Status: AC | PRN
  Administered 2020-07-07: 100 mL via INTRAVENOUS

## 2020-07-07 NOTE — Progress Notes (Signed)
[facility]   [phone] [phone]   Clinic Follow up Note   Patient Care Team: Meccariello, Bernita Raisin, DO as PCP - General (Family Medicine) Michael Boston, MD as Consulting Physician (General Surgery) [name], Kirke Corin, MD as Consulting Physician (Gastroenterology) Kyung Rudd, MD as Consulting Physician (Radiation Oncology) Truitt Merle, MD as Consulting Physician (Oncology)  Date of Service:  07/10/2020  CHIEF COMPLAINT: Follow up rectal cancer  SUMMARY OF ONCOLOGIC HISTORY: Oncology History Overview Note  Rectal adenocarcinoma [facility])   Staging form: Colon and Rectum, AJCC 7th Edition   - Clinical stage from 07/28/2016: Stage IIIB (T3, N2a, M0) - Signed by Truitt Merle, MD on 08/06/2016    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  07/26/2016 Imaging   CT chest, abdomen and pelvis with contrast showed nodular thickening of the rectal wall, enlarged perirectal lymph nodes. Diverticulosis. Nonspecific low-level lymphadenopathy. Lower lobe emphysema, small 3-4 mm left lower lobe lung nodules.   07/28/2016 Initial Diagnosis   Rectal adenocarcinoma ([place])   07/28/2016 Initial Biopsy   Rectal mass biopsy showed invasive adenocarcinoma, polyps in the ascending colon and rectum showed tubular adenoma    07/28/2016 Procedure   Colonoscopy showed a nearly circumferential mass in the distal rectum, 4 polyps in the descending colon, diverticulosis in the left colon.    07/29/2016 Procedure   Low EUS showed clearly malignant 6 cm long, nearly circumferential mass in the distal rectum, with distal edge located to 1 cm from the annual approach, multiple prior rectal lymph nodes (6) are suspicious ,uT3 N2    08/17/2016 - 09/24/2016 Radiation Therapy   Neoadjuvant radiation to his rectal cancer 1) Rectum/ 45 Gy in 25 fx                      2) Boost/ 5.4 Gy in 3 fx    08/17/2016 - 09/24/2016 Chemotherapy   Xeloda 1500 mg twice daily with concurrent  irradiation   08/18/2016 Imaging   PET scan showed hypermetabolic rectal mass, perirectal node 10m with mild increased uptake, no hypermetabolic distant metastasis, small pulmonary nodules are too small to characterize by PET.    11/29/2016 Imaging   CT of the C/A/P W Contrast IMPRESSION: 1. Interval treatment changes within the perirectal fat with otherwise stable rectosigmoid colon wall thickening. 2. No progressive adenopathy or distant metastases identified. 3. Stable bilateral pulmonary nodules. Short-term stability (for 4 months) is reassuring, although the nodules are too small to optimally characterize by previous PET-CT. Continued CT follow-up recommended.   12/01/2016 Surgery   Colon total resection and proctocolectomy for rectal cancer and Lynch syndrome    12/01/2016 Pathology Results   Proctocolectomy showed scattered small residual foci of invasive moderately differentiated adenocarcinoma embedded in submucosa tissue with extensive neoadjuvant effect, 2.5 cm in greatest time mention, adenocarcinoma invades through muscularis mucosa and involves subserosal soft tissue, margins are negative, 2 of 35 lymph nodes are positive for metastatic adenocarcinoma, 2 calcified and necrotic soft tissue nodules are also present without visible tumor seen.   01/07/2017 - 05/26/2017 Chemotherapy   Adjuvant chemo CAPOX, changed to FOLFOX from cycle 2 due to poor tolerance, a total of 4.5 months therapy     01/26/2017 Surgery   PLACEMENT OF PORT-A-CATH CENTRAL LINE WITH FLUOROSCOPY AND ULTRASOUND   06/21/2017 Imaging   CT Chest w contrast 06/21/2017 IMPRESSION: 1. No evidence of metastatic disease. 2.  Emphysema (ICD10-J43.9). 3. Scattered subpleural pulmonary nodules measure 5 mm or less in  size, stable and likely subpleural lymph nodes.   09/21/2017 Procedure   Pouchoscopy by Dr. Marcello Moores on 09/21/17 IMPRESSION  - The examined portion of the ileum was normal. - No specimens collected.     09/22/2017 Surgery   TAKEDOWN OF LOOP ILEOSTOMY by Dr. Johney Maine on 09/22/17  Diagnosis 09/22/17  Ileostomy, loop SKIN AND COLONIC MUCOSA NEGATIVE FOR CARCINOMA   12/06/2017 Imaging   CT CAP W Contrast 12/06/17 IMPRESSION: 1. Postoperative changes of total proctocolectomy and ileoanal anastomosis without evidence of metastatic disease. Ileoanal anastomosis appears tethered to the sacrum by presacral thickening. 2.  Emphysema (ICD10-J43.9).   01/12/2018 Procedure   Pouchoscopy by Dr. Marcello Moores 01/12/18 - Ileoanal anastomotic stricture found on perianal exam. - No specimens collected. - Erythematous mucosa in the ileoanal pouch. Biopsied.   Pathology results  Diagnosis 01/12/18 Rectum, biopsy, J Pouch mucosa - SMALL BOWEL TYPE MUCOSA WITH CHRONIC INFLAMMATION - NO GRANULOMATA, DYSPLASIA, OR MALIGNANCY IDENTIFIED   06/25/2019 Imaging   CT CAP IMPRESSION: 1. Low anterior section and permanent right sided ileostomy, without evidence of recurrent or metastatic disease. Probable postoperative seroma in the presacral space, stable. 2. Mild hepatomegaly. 3. Cholelithiasis. 4.  Emphysema (ICD10-J43.9).   06/19/2020 Imaging   CT Chest  IMPRESSION: 1. Interval development of multiple small nodules in the lungs compared with 1 year ago. Given history of rectal cancer, metastatic disease is suspected. Most these nodules are very small and continued close follow-up is warranted. 2. Moderate emphysema   07/07/2020 Imaging   CT AP w contrast  IMPRESSION: 1. No evidence of metastatic disease in the abdomen or pelvis. Pulmonary nodules were better seen on dedicated CT chest 06/19/2020. 2. Hepatic steatosis. 3. Cholelithiasis. 4.  Emphysema (ICD10-J43.9).      CURRENT THERAPY:  Surveillance  INTERVAL HISTORY:  [name] is here for a follow up of rectal cancer. He presents to the clinic with his daughter. He plans to see [facility] soon. He feels his breathing has improved with  inhaler, Spiriva. He is still SOB upon increased exertion. He has not needed nebulizer recently. He has been having cough with green to dark phlegm production. He denies fever. He notes he is smoking 1ppd currently. He was able to not smoke for 1 day this week.     REVIEW OF SYSTEMS:   Constitutional: Denies fevers, chills or abnormal weight loss Eyes: Denies blurriness of vision Ears, nose, mouth, throat, and face: Denies mucositis or sore throat Respiratory: Denies cough or wheezes (+) SOB upon exertion Cardiovascular: Denies palpitation, chest discomfort or lower extremity swelling Gastrointestinal:  Denies nausea, heartburn or change in bowel habits Skin: Denies abnormal skin rashes Lymphatics: Denies new lymphadenopathy or easy bruising Neurological:Denies numbness, tingling or new weaknesses Behavioral/Psych: Mood is stable, no new changes  All other systems were reviewed with the patient and are negative.  MEDICAL HISTORY:  Past Medical History:  Diagnosis Date  . Anxiety   . C. difficile diarrhea 03/30/2018  . Colon cancer ([place]) 12/01/2016  . Depression   . Family history of colon cancer   . 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 In families with MSH6 mutations, screening can start at age 10 years since the onset of colon cancer is later  in these families.  Annual screening for endome  . Poor dental hygiene   . Rectal adenocarcinoma s/p protctocolectomy, IPAA "J" pouch 12/01/2016 08/06/2016  . 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 COLON SURGERY    .  EUS N/A 07/29/2016   Procedure: LOWER ENDOSCOPIC ULTRASOUND (EUS);  Surgeon: Milus Banister, MD;  Location: [facility];  Service: Endoscopy;  Laterality: N/A;  . ILEOSTOMY CLOSURE N/A 09/22/2017   Procedure: TAKEDOWN OF LOOP ILEOSTOMY;  Surgeon: Michael Boston, MD;  Location: [facility];  Service: General;  Laterality: N/A;  . LYSIS OF ADHESION N/A 05/10/2018   Procedure: LYSIS OF ADHESION;  Surgeon: Michael Boston, MD;  Location: [facility];  Service: General;  Laterality: N/A;  . PORTACATH PLACEMENT N/A 01/26/2017   Procedure: PLACEMENT OF PORT-A-CATH CENTRAL LINE WITH FLUOROSCOPY AND ULTRASOUND;  Surgeon: Michael Boston, MD;  Location: [facility];  Service: General;  Laterality: N/A;  . POUCHOSCOPY N/A 09/21/2017   Procedure: POUCHOSCOPY;  Surgeon: Leighton Ruff, MD;  Location: [facility];  Service: Endoscopy;  Laterality: N/A;  . POUCHOSCOPY N/A 01/12/2018   Procedure: POUCHOSCOPY WITH BIOPSIES;  Surgeon: Leighton Ruff, MD;  Location: [facility];  Service: Endoscopy;  Laterality: N/A;  Local  . PROCTOSCOPY N/A 12/01/2016   Procedure: RIGID PROCTOSCOPY;  Surgeon: Michael Boston, MD;  Location: [facility];  Service: General;  Laterality: N/A;  . RECTAL EXAM UNDER ANESTHESIA N/A 05/10/2018   Procedure: RECTAL EXAM UNDER ANESTHESIA;  Surgeon: Michael Boston, MD;  Location: [facility];  Service: General;  Laterality: N/A;  . TOE AMPUTATION Left   . XI ROBOTIC ASSISTED LOWER ANTERIOR RESECTION N/A 12/01/2016   Procedure: XI ROBOTIC ASSISTED PROCTOCOLECTOMY WITH ILLEOPOUCH ANASTAMOSIS WITH DIVERTING ILLEOSTOMY;  Surgeon: Michael Boston, MD;  Location: [facility];  Service: General;  Laterality: N/A;  . XI ROBOTIC ASSISTED LOWER ANTERIOR RESECTION N/A 05/10/2018   Procedure: XI ROBOTIC RESECTION OF ILEAL J POUCH, LYSIS OF ADHESIONS, WITH CREATION OF PERMANENT END ILEOSTOMY.;  Surgeon: Michael Boston, MD;  Location: [facility];  Service: General;  Laterality: N/A;    I have reviewed the social history and family  history with the patient and they are unchanged from previous note.  ALLERGIES:  has No Known Allergies.  MEDICATIONS:  Current Outpatient Medications  Medication Sig Dispense Refill  . albuterol (PROVENTIL) (2.5 MG/3ML) 0.083% nebulizer solution Take 3 mLs (2.5 mg total) by nebulization every 6 (six) hours as needed for wheezing or shortness of breath. 75 mL 0  . albuterol (VENTOLIN HFA) 108 (90 Base) MCG/ACT inhaler Inhale 2 puffs into the lungs every 4 (four) hours as needed for wheezing or shortness of breath. 18 g 2  . gabapentin (NEURONTIN) 300 MG capsule Take 2 capsules (600 mg total) by mouth 3 (three) times daily. 180 capsule 6  . sertraline (ZOLOFT) 100 MG tablet Take 1 tablet (100 mg total) by mouth daily. 90 tablet 3  . Tiotropium Bromide Monohydrate (SPIRIVA RESPIMAT) 2.5 MCG/ACT AERS Inhale 2 puffs into the lungs daily. 4 g 2   No current facility-administered medications for this visit.   Facility-Administered Medications Ordered in Other Visits  Medication Dose Route Frequency Provider Last Rate Last Admin  . sodium chloride flush (NS) 0.9 % injection 10 mL  10 mL Intravenous PRN Truitt Merle, MD   10 mL at 11/18/17 0953    PHYSICAL EXAMINATION: ECOG  PERFORMANCE STATUS: 2 - Symptomatic, <50% confined to bed  Vitals:   07/10/20 1047  BP: 117/82  Pulse: 77  Resp: 18  Temp: 97.8 F (36.6 C)  SpO2: 98%   Filed Weights   07/10/20 1047  Weight: 176 lb 14.4 oz (80.2 kg)    Due to COVID19 we will limit examination to appearance. Patient had no complaints.  GENERAL:alert, no distress and comfortable SKIN: skin color normal, no rashes or significant lesions EYES: normal, Conjunctiva are pink and non-injected, sclera clear  NEURO: alert & oriented x 3 with fluent speech   LABORATORY DATA:  I have reviewed the data as listed CBC Latest Ref Rng & Units 06/26/2020 06/04/2020 06/06/2019  WBC 4.0 - 10.5 K/uL 7.0 9.5 9.2  Hemoglobin 13.0 - 17.0 g/dL 16.7 16.6 16.6  Hematocrit  39 - 52 % 50.0 49.9 50.7  Platelets 150 - 400 K/uL 223 210 250     CMP Latest Ref Rng & Units 06/26/2020 06/04/2020 06/06/2019  Glucose 70 - 99 mg/dL 139(H) 218(H) 126(H)  BUN 6 - 20 mg/dL _0 Creatinine 0.61 - 1.24 mg/dL 0.85 0.90 0.89  Sodium 135 - 145 mmol/L 139 138 139  Potassium 3.5 - 5.1 mmol/L 4.2 4.3 4.2  Chloride 98 - 111 mmol/L 108 104 111  CO2 22 - 32 mmol/L 24 21 21(L)  Calcium 8.9 - 10.3 mg/dL 8.8(L) 8.8 7.8(L)  Total Protein 6.5 - 8.1 g/dL 5.6(L) - 5.6(L)  Total Bilirubin 0.3 - 1.2 mg/dL 0.2(L) - <0.2(L)  Alkaline Phos 38 - 126 U/L 55 - 60  AST 15 - 41 U/L 30 - 20  ALT 0 - 44 U/L 40 - 23      RADIOGRAPHIC STUDIES: I have personally reviewed the radiological images as listed and agreed with the findings in the report. No results found.   ASSESSMENT & PLAN:  [name] is a 49 y.o. male with    1. 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 He was treated with neoadjuvant concurrent chemoRT, colon surgery, adjuvant chemo and ileostomy reversal. -He hadileostomyplacedagaindue to uncontrolleddiarrhea. It's manageable now -He lost follow up after 05/2019. His CT CAP at the time (06/25/19) showed no evidence of cancer recurrence or metastatic disease.  -For the past 6 months his SOB and cough has progressed. His PCP had him undergo CT Chest from 06/19/20 shows Interval development of multiple small nodules in the lungs compared with 1 year ago. Given history of rectal cancer, metastatic disease is suspected. Lung primary is less likely given presentation.  -These lung nodules are too small to biopsy at this time and may be too small and will below the threshold of PET scan. His insurance denied PET scan  -I personally reviewed his recent CT AP with contrast which showed NED.  -I discussed his GuardantReveal Testing was negative for circulating tumor cells -since I do not  have definitve diagnosis of metastatic disease, will hold on chemo for now  -I recommend repeating CT chest scan to monitor in 2 months, he agrees -f/u in 2 months    2.multiple lung nodules, dyspnea on exertion, Cough, Emphysema   -He has a history of smoking and is currently still smokes 1ppd. He has known cough which he attributed to smoking.  -In the past 6 months (first half of 2021) his SOB has progressed along with productive cough which has significantly decreased his energy and activity  level.  -His PCP ordered a CT chest on 06/19/20 which shows interval development of multiple small b/l pulmonary nodules compared to last year. There is concern for metastatic disease. Scan also shows moderate emphysema.  -Given his smoking history and current smoking intermittently 1ppd his symptoms could also be related to this. He plans to consult with [facility] this month.  -His symptoms have improved on Spiriva. He has not needed nebulizer recently. He still has SOB upon exertion. He has cough with greenish phlegm production. He denies fever.  -I suggest nicotine patches to help him quit smoking, he declined but still working on quitting.    3. 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 -He has undergone total colectomy and proctocolectomy, his risk of secondary colon cancer is minimal -Ipreviouslydiscussedwith the ptother cancer risks from age syndrome, I previously recommend him to have EGD every 3-5 years, annual urinalysis for GU cancer screening. -He hassiblingbut one brother has not been checked. His son isa teenager. I encouraged him to have him checked at adult age.   4. Anxiety, Depression, Social issues  -His family member notes he has been nervous about cancer recurrence. He notes smoking helps him but understands he should quit.  -He is out of  work, on disability  -I encouraged him to increase activity and stay busy to help him quit smoking.   Plan -Labs and CT AP reviewed -F/u in 2 months with lab and CT Chest a few days before  -he is scheduled to see pulmonologist Dr. Loanne Drilling in a month, will copy her    No problem-specific Assessment & Plan notes found for this encounter.   Orders Placed This Encounter  Procedures  . CT Chest W Contrast    Standing Status:   Future    Standing Expiration Date:   07/10/2021    Order Specific Question:   If indicated for the ordered procedure, I authorize the administration of contrast media per Radiology protocol    Answer:   Yes    Order Specific Question:   Preferred imaging location?    Answer:   [facility]    Order Specific Question:   Release to patient    Answer:   Immediate    Order Specific Question:   Radiology Contrast Protocol - do NOT remove file path    Answer:   [email or web address]   All questions were answered. The patient knows to call the clinic with any problems, questions or concerns. No barriers to learning was detected. The total time spent in the appointment was 30 minutes.     Truitt Merle, MD 07/10/2020   I, Joslyn Devon, am acting as scribe for Truitt Merle, MD.   I have reviewed the above documentation for accuracy and completeness, and I agree with the above.

## 2020-07-10 ENCOUNTER — Encounter: Payer: Self-pay | Admitting: Family Medicine

## 2020-07-10 ENCOUNTER — Other Ambulatory Visit: Payer: Self-pay

## 2020-07-10 ENCOUNTER — Inpatient Hospital Stay (HOSPITAL_BASED_OUTPATIENT_CLINIC_OR_DEPARTMENT_OTHER): Payer: Medicaid Other | Admitting: Hematology

## 2020-07-10 ENCOUNTER — Telehealth: Payer: Self-pay | Admitting: Hematology

## 2020-07-10 ENCOUNTER — Encounter: Payer: Self-pay | Admitting: Hematology

## 2020-07-10 VITALS — BP 117/82 | HR 77 | Temp 97.8°F | Resp 18 | Ht 71.0 in | Wt 176.9 lb

## 2020-07-10 DIAGNOSIS — C20 Malignant neoplasm of rectum: Secondary | ICD-10-CM | POA: Diagnosis not present

## 2020-07-10 DIAGNOSIS — R918 Other nonspecific abnormal finding of lung field: Secondary | ICD-10-CM | POA: Diagnosis not present

## 2020-07-10 DIAGNOSIS — Z1509 Genetic susceptibility to other malignant neoplasm: Secondary | ICD-10-CM

## 2020-07-10 NOTE — Telephone Encounter (Signed)
Scheduled per 07/22 los, patient will be notified by My chart.

## 2020-07-15 ENCOUNTER — Telehealth: Payer: Self-pay

## 2020-07-15 NOTE — Telephone Encounter (Signed)
[name], with [name], LVM on nurse line requesting VO for patients ostomy supplies. I attempted to call [name] back to give verbal, however had to LVM. Will await his call to give verbal authorization for patients supplies.

## 2020-07-17 LAB — GUARDANT 360

## 2020-08-04 ENCOUNTER — Institutional Professional Consult (permissible substitution): Payer: Medicaid Other | Admitting: Pulmonary Disease

## 2020-08-14 ENCOUNTER — Encounter: Payer: Self-pay | Admitting: Pulmonary Disease

## 2020-08-14 ENCOUNTER — Ambulatory Visit: Payer: Medicaid Other | Admitting: Pulmonary Disease

## 2020-08-14 ENCOUNTER — Other Ambulatory Visit: Payer: Self-pay

## 2020-08-14 VITALS — BP 112/72 | HR 77 | Temp 97.6°F | Ht 71.0 in | Wt 175.2 lb

## 2020-08-14 DIAGNOSIS — J432 Centrilobular emphysema: Secondary | ICD-10-CM | POA: Diagnosis not present

## 2020-08-14 DIAGNOSIS — J441 Chronic obstructive pulmonary disease with (acute) exacerbation: Secondary | ICD-10-CM | POA: Diagnosis not present

## 2020-08-14 MED ORDER — BUDESONIDE-FORMOTEROL FUMARATE 160-4.5 MCG/ACT IN AERO: 2.0000 | INHALATION_SPRAY | Freq: Two times a day (BID) | RESPIRATORY_TRACT | 12 refills | Status: DC

## 2020-08-14 MED ORDER — AZITHROMYCIN 250 MG PO TABS: 250.0000 mg | ORAL_TABLET | Freq: Every day | ORAL | 0 refills | Status: DC

## 2020-08-14 MED ORDER — ALBUTEROL SULFATE HFA 108 (90 BASE) MCG/ACT IN AERS: 2.0000 | INHALATION_SPRAY | RESPIRATORY_TRACT | 2 refills | Status: DC | PRN

## 2020-08-14 MED ORDER — PREDNISONE 10 MG PO TABS: ORAL_TABLET | ORAL | 0 refills | Status: DC

## 2020-08-14 NOTE — Patient Instructions (Signed)
COPD/Emphysema exacerbation --START prednisone 40 mg daily x 5 days --START azithromycin 250 mg daily x 5 days --START Symbicort 160-4.5 mcg TWO puffs TWICE a day --CONTINUE Spiriva 2.5 mcg TWO puffs ONCE a day --CONTINUE your rescue inhaler (albuterol) as needed  STOP SMOKING

## 2020-08-14 NOTE — Progress Notes (Signed)
Subjective:   PATIENT ID: [name] GENDER: male DOB: 15-Mar-1971, MRN: [id number]   HPI  Chief Complaint  Patient presents with  . Consult    shortness of breath with exertion for past year    Reason for Visit: New consult for shortness of breath per Dr. Burr Medico  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  He is referred to Pulmonary clinic by Dr. Burr Medico. He has had worsening shortness of breath in the last year and was started on Spiriva 2-3 months ago. He has also been using a nebulizer once a night and an albuterol inhaler 2-3 times day. Associated with wheezing. No cough. His dyspnea worsens with activity. He has noticed he now has difficulty mowing the lawn and other activities with moderate to heavy exertion. Rest and the rescue inhalers improve his symptoms. He does not notice any triggers like allergies or changes in weather associated. He is an active smoke and currently smoking 1ppd.   Social History: Current smoker. 33 pack-years  I have personally reviewed patient's past medical/family/social history, allergies, current medications.  Past Medical History:  Diagnosis Date  . Anxiety   . C. difficile diarrhea 03/30/2018  . Colon cancer ([place]) 12/01/2016  . COPD (chronic obstructive pulmonary disease) ([place])   . Depression   . Family history of colon cancer   . 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 10/13/2016   MSH6 pathogenic mutation  Lynch syndrome screening recs (from up to date)  Annual colonoscopy starting between the ages of 48 and 37 years, or 10 years prior to the earliest age of colon cancer diagnosis in the family (whichever comes first). In families with MSH6 mutations, screening can start at age 58 years since the onset of colon cancer is later in these families.  Annual screening for endome  .  Poor dental hygiene   . Rectal adenocarcinoma s/p protctocolectomy, IPAA "J" pouch 12/01/2016 08/06/2016  . 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 Diabetes Mother   . COPD Father   . Colon cancer Maternal Aunt        dx in her 77s  . Diabetes Maternal Grandmother   . Brain cancer Maternal Grandfather   . Lung cancer Paternal Grandfather   . Colon cancer Cousin        dx in her 2s  . Bone cancer Sister 8  . Pancreatic cancer Neg Hx   . Esophageal cancer Neg Hx   . Stomach cancer Neg Hx   . Liver disease Neg Hx      Social History   Occupational History  . Not on file  Tobacco Use  . Smoking status: Current Every Day Smoker    Packs/day: 1.00    Years: 33.00    Pack years: 33.00    Types: Cigarettes    Start date: 12/20/1985  . Smokeless tobacco: Never Used  . Tobacco comment: smokes 1 pack/day- 08/14/2020  Vaping Use  . Vaping Use: Never used  Substance and Sexual Activity  . Alcohol use: No  . Drug use: No    Comment: patient denies  . Sexual activity: Yes    No Known Allergies   Outpatient Medications Prior to Visit  Medication Sig  Dispense Refill  . albuterol (PROVENTIL) (2.5 MG/3ML) 0.083% nebulizer solution Take 3 mLs (2.5 mg total) by nebulization every 6 (six) hours as needed for wheezing or shortness of breath. 75 mL 0  . albuterol (VENTOLIN HFA) 108 (90 Base) MCG/ACT inhaler Inhale 2 puffs into the lungs every 4 (four) hours as needed for wheezing or shortness of breath. 18 g 2  . gabapentin (NEURONTIN) 300 MG capsule Take 2 capsules (600 mg total) by mouth 3 (three) times daily. 180 capsule 6  . sertraline (ZOLOFT) 100 MG tablet Take 1 tablet (100 mg total) by mouth daily. 90 tablet 3  . Tiotropium Bromide Monohydrate (SPIRIVA RESPIMAT) 2.5 MCG/ACT AERS Inhale 2 puffs into the lungs daily. 4 g 2   Facility-Administered  Medications Prior to Visit  Medication Dose Route Frequency Provider Last Rate Last Admin  . sodium chloride flush (NS) 0.9 % injection 10 mL  10 mL Intravenous PRN Truitt Merle, MD   10 mL at 11/18/17 9211    Review of Systems  Constitutional: Negative for chills, diaphoresis, fever, malaise/fatigue and weight loss.  HENT: Negative for congestion, ear pain and sore throat.   Respiratory: Positive for shortness of breath and wheezing. Negative for cough, hemoptysis and sputum production.   Cardiovascular: Negative for chest pain, palpitations and leg swelling.  Gastrointestinal: Negative for abdominal pain, heartburn and nausea.  Genitourinary: Negative for frequency.  Musculoskeletal: Negative for joint pain and myalgias.  Skin: Negative for itching and rash.  Neurological: Negative for dizziness, weakness and headaches.  Endo/Heme/Allergies: Does not bruise/bleed easily.  Psychiatric/Behavioral: Negative for depression. The patient is not nervous/anxious.      Objective:   Vitals:   08/14/20 1151  BP: 112/72  Pulse: 77  Temp: 97.6 F (36.4 C)  TempSrc: Other (Comment)  SpO2: 97%  Weight: 175 lb 3.2 oz (79.5 kg)  Height: $Remove'5\' 11"'QpYuFrP$  (1.803 m)   SpO2: 97 % (room air) O2 Device: None (Room air)  Physical Exam: General: Well-appearing, no acute distress HENT: [place], AT Eyes: EOMI, no scleral icterus Respiratory: Diminshed .  Expiratory wheeze Cardiovascular: RRR, -M/R/G, no JVD GI: BS+, soft, nontender, right colostomy Extremities:-Edema,-tenderness Neuro: AAO x4, CNII-XII grossly intact Skin: Intact, no rashes or bruising Psych: Normal mood, normal affect  Data Reviewed:  Imaging: CT Chest 06/19/20 - Interval multiple subcentimeter lung nodules since 06/25/19. Background emphysema  PFT: None  Labs: CBC    Component Value Date/Time   WBC 7.0 06/26/2020 1300   WBC 9.2 06/06/2019 1110   RBC 5.66 06/26/2020 1300   HGB 16.7 06/26/2020 1300   HGB 16.6 06/04/2020 1502   HGB  14.9 11/18/2017 0933   HCT 50.0 06/26/2020 1300   HCT 49.9 06/04/2020 1502   HCT 44.6 11/18/2017 0933   PLT 223 06/26/2020 1300   PLT 210 06/04/2020 1502   MCV 88.3 06/26/2020 1300   MCV 88 06/04/2020 1502   MCV 87.8 11/18/2017 0933   MCH 29.5 06/26/2020 1300   MCHC 33.4 06/26/2020 1300   RDW 13.7 06/26/2020 1300   RDW 13.4 06/04/2020 1502   RDW 14.3 11/18/2017 0933   LYMPHSABS 1.4 06/26/2020 1300   LYMPHSABS 1.2 06/04/2020 1502   LYMPHSABS 0.9 11/18/2017 0933   MONOABS 0.5 06/26/2020 1300   MONOABS 0.5 11/18/2017 0933   EOSABS 0.2 06/26/2020 1300   EOSABS 0.2 06/04/2020 1502   BASOSABS 0.1 06/26/2020 1300   BASOSABS 0.1 06/04/2020 1502   BASOSABS 0.1 11/18/2017 0933        Assessment &  Plan:   Discussion: 49 year old male with emphysema who presents with shortness of breath. Suspect obstructive lung disease in active exacerbation. Will add ICS/LABA to LAMA for respiratory regimen.  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 Given size of lesions, PET would likely demonstrate false negative. His Oncologist notes that insurance also denied scan. They plan to hold chemo for now and repeat CT chest.  COPD/Emphysema exacerbation --START prednisone 40 mg daily x 5 days --START azithromycin 250 mg daily x 5 days --START Symbicort 160-4.5 mcg TWO puffs TWICE a day --CONTINUE Spiriva 2.5 mcg TWO puffs ONCE a day --CONTINUE your rescue inhaler (albuterol) as needed  Tobacco abuse Patient is an active smoker. We discussed smoking cessation for 5 minutes. We discussed triggers and stressors and ways to deal with them. We discussed barriers to continued smoking and benefits of smoking cessation. Provided patient with information cessation techniques and interventions including [place] quitline.  Health Maintenance Immunization History  Administered Date(s) Administered  . Influenza,inj,Quad PF,6+ Mos 09/30/2016, 11/18/2017, 09/15/2018    . Unspecified SARS-COV-2 Vaccination 06/13/2020   CT Lung Screen - not qualified with abnormal CT  No orders of the defined types were placed in this encounter.  Meds ordered this encounter  Medications  . budesonide-formoterol (SYMBICORT) 160-4.5 MCG/ACT inhaler    Sig: Inhale 2 puffs into the lungs in the morning and at bedtime.    Dispense:  1 each    Refill:  12  . predniSONE (DELTASONE) 10 MG tablet    Sig: Take 4 tabs daily by mouth for five days    Dispense:  20 tablet    Refill:  0  . azithromycin (ZITHROMAX) 250 MG tablet    Sig: Take 1 tablet (250 mg total) by mouth daily.    Dispense:  5 tablet    Refill:  0  . albuterol (VENTOLIN HFA) 108 (90 Base) MCG/ACT inhaler    Sig: Inhale 2 puffs into the lungs every 4 (four) hours as needed for wheezing or shortness of breath.    Dispense:  18 g    Refill:  2    Return in about 3 months (around 11/14/2020).  Coren Sagan Rodman Pickle, MD [facility] 08/14/2020 12:06 PM  Office Number [phone]

## 2020-08-23 ENCOUNTER — Other Ambulatory Visit: Payer: Self-pay | Admitting: Family Medicine

## 2020-08-26 DIAGNOSIS — J441 Chronic obstructive pulmonary disease with (acute) exacerbation: Secondary | ICD-10-CM | POA: Insufficient documentation

## 2020-08-26 DIAGNOSIS — J432 Centrilobular emphysema: Secondary | ICD-10-CM | POA: Insufficient documentation

## 2020-09-08 ENCOUNTER — Encounter (HOSPITAL_COMMUNITY): Payer: Self-pay

## 2020-09-08 ENCOUNTER — Other Ambulatory Visit: Payer: Self-pay

## 2020-09-08 ENCOUNTER — Ambulatory Visit (HOSPITAL_COMMUNITY)
Admission: RE | Admit: 2020-09-08 | Discharge: 2020-09-08 | Disposition: A | Discharge status: Home / Self Care | Payer: Medicaid Other | Source: Ambulatory Visit | Attending: Hematology | Admitting: Hematology

## 2020-09-08 DIAGNOSIS — R918 Other nonspecific abnormal finding of lung field: Secondary | ICD-10-CM | POA: Diagnosis not present

## 2020-09-08 DIAGNOSIS — C20 Malignant neoplasm of rectum: Secondary | ICD-10-CM | POA: Diagnosis present

## 2020-09-08 DIAGNOSIS — R911 Solitary pulmonary nodule: Secondary | ICD-10-CM | POA: Diagnosis not present

## 2020-09-08 DIAGNOSIS — J432 Centrilobular emphysema: Secondary | ICD-10-CM | POA: Diagnosis not present

## 2020-09-08 MED ORDER — IOHEXOL 300 MG/ML  SOLN
75.0000 mL | Freq: Once | INTRAMUSCULAR | Status: AC | PRN
  Administered 2020-09-08: 75 mL via INTRAVENOUS

## 2020-09-11 ENCOUNTER — Inpatient Hospital Stay: Payer: Medicaid Other | Attending: Hematology

## 2020-09-11 ENCOUNTER — Inpatient Hospital Stay: Payer: Medicaid Other | Admitting: Hematology

## 2020-09-11 DIAGNOSIS — R911 Solitary pulmonary nodule: Secondary | ICD-10-CM | POA: Insufficient documentation

## 2020-09-11 DIAGNOSIS — J439 Emphysema, unspecified: Secondary | ICD-10-CM | POA: Insufficient documentation

## 2020-09-11 DIAGNOSIS — F17210 Nicotine dependence, cigarettes, uncomplicated: Secondary | ICD-10-CM | POA: Insufficient documentation

## 2020-09-11 DIAGNOSIS — Z23 Encounter for immunization: Secondary | ICD-10-CM | POA: Insufficient documentation

## 2020-09-11 DIAGNOSIS — F329 Major depressive disorder, single episode, unspecified: Secondary | ICD-10-CM | POA: Insufficient documentation

## 2020-09-11 DIAGNOSIS — C20 Malignant neoplasm of rectum: Secondary | ICD-10-CM | POA: Insufficient documentation

## 2020-09-11 DIAGNOSIS — F419 Anxiety disorder, unspecified: Secondary | ICD-10-CM | POA: Insufficient documentation

## 2020-09-16 ENCOUNTER — Telehealth: Payer: Self-pay

## 2020-09-16 ENCOUNTER — Telehealth: Payer: Self-pay | Admitting: Hematology

## 2020-09-16 NOTE — Telephone Encounter (Signed)
Ms [name] left vm wanting to rescheduled missed appts from last week.  Scheduling message sent.

## 2020-09-16 NOTE — Telephone Encounter (Signed)
Scheduled appt per 9/28 sch msg - pt wife aware of appt.

## 2020-09-17 NOTE — Progress Notes (Signed)
[place]   [phone] [phone]   Clinic Follow up Note   Patient Care Team: Meccariello, Bernita Raisin, DO as PCP - General (Family Medicine) Michael Boston, MD as Consulting Physician (General Surgery) [name], Kirke Corin, MD as Consulting Physician (Gastroenterology) Kyung Rudd, MD as Consulting Physician (Radiation Oncology) Truitt Merle, MD as Consulting Physician (Oncology) Margaretha Seeds, MD as Consulting Physician (Pulmonary Disease)  Date of Service:  09/18/2020  CHIEF COMPLAINT: Follow up rectal cancer  SUMMARY OF ONCOLOGIC HISTORY: Oncology History Overview Note  Rectal adenocarcinoma [facility])   Staging form: Colon and Rectum, AJCC 7th Edition   - Clinical stage from 07/28/2016: Stage IIIB (T3, N2a, M0) - Signed by Truitt Merle, MD on 08/06/2016    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  07/26/2016 Imaging   CT chest, abdomen and pelvis with contrast showed nodular thickening of the rectal wall, enlarged perirectal lymph nodes. Diverticulosis. Nonspecific low-level lymphadenopathy. Lower lobe emphysema, small 3-4 mm left lower lobe lung nodules.   07/28/2016 Initial Diagnosis   Rectal adenocarcinoma ([place])   07/28/2016 Initial Biopsy   Rectal mass biopsy showed invasive adenocarcinoma, polyps in the ascending colon and rectum showed tubular adenoma    07/28/2016 Procedure   Colonoscopy showed a nearly circumferential mass in the distal rectum, 4 polyps in the descending colon, diverticulosis in the left colon.    07/29/2016 Procedure   Low EUS showed clearly malignant 6 cm long, nearly circumferential mass in the distal rectum, with distal edge located to 1 cm from the annual approach, multiple prior rectal lymph nodes (6) are suspicious ,uT3 N2    08/17/2016 - 09/24/2016 Radiation Therapy   Neoadjuvant radiation to his rectal cancer 1) Rectum/ 45 Gy in 25 fx                      2) Boost/ 5.4 Gy in 3 fx    08/17/2016 - 09/24/2016  Chemotherapy   Xeloda 1500 mg twice daily with concurrent irradiation   08/18/2016 Imaging   PET scan showed hypermetabolic rectal mass, perirectal node 58m with mild increased uptake, no hypermetabolic distant metastasis, small pulmonary nodules are too small to characterize by PET.    11/29/2016 Imaging   CT of the C/A/P W Contrast IMPRESSION: 1. Interval treatment changes within the perirectal fat with otherwise stable rectosigmoid colon wall thickening. 2. No progressive adenopathy or distant metastases identified. 3. Stable bilateral pulmonary nodules. Short-term stability (for 4 months) is reassuring, although the nodules are too small to optimally characterize by previous PET-CT. Continued CT follow-up recommended.   12/01/2016 Surgery   Colon total resection and proctocolectomy for rectal cancer and Lynch syndrome    12/01/2016 Pathology Results   Proctocolectomy showed scattered small residual foci of invasive moderately differentiated adenocarcinoma embedded in submucosa tissue with extensive neoadjuvant effect, 2.5 cm in greatest time mention, adenocarcinoma invades through muscularis mucosa and involves subserosal soft tissue, margins are negative, 2 of 35 lymph nodes are positive for metastatic adenocarcinoma, 2 calcified and necrotic soft tissue nodules are also present without visible tumor seen.   01/07/2017 - 05/26/2017 Chemotherapy   Adjuvant chemo CAPOX, changed to FOLFOX from cycle 2 due to poor tolerance, a total of 4.5 months therapy     01/26/2017 Surgery   PLACEMENT OF PORT-A-CATH CENTRAL LINE WITH FLUOROSCOPY AND ULTRASOUND   06/21/2017 Imaging   CT Chest w contrast 06/21/2017 IMPRESSION: 1. No evidence of metastatic disease. 2.  Emphysema (ICD10-J43.9). 3. Scattered  subpleural pulmonary nodules measure 5 mm or less in size, stable and likely subpleural lymph nodes.   09/21/2017 Procedure   Pouchoscopy by Dr. Marcello Moores on 09/21/17 IMPRESSION  - The examined portion  of the ileum was normal. - No specimens collected.   09/22/2017 Surgery   TAKEDOWN OF LOOP ILEOSTOMY by Dr. Johney Maine on 09/22/17  Diagnosis 09/22/17  Ileostomy, loop SKIN AND COLONIC MUCOSA NEGATIVE FOR CARCINOMA   12/06/2017 Imaging   CT CAP W Contrast 12/06/17 IMPRESSION: 1. Postoperative changes of total proctocolectomy and ileoanal anastomosis without evidence of metastatic disease. Ileoanal anastomosis appears tethered to the sacrum by presacral thickening. 2.  Emphysema (ICD10-J43.9).   01/12/2018 Procedure   Pouchoscopy by Dr. Marcello Moores 01/12/18 - Ileoanal anastomotic stricture found on perianal exam. - No specimens collected. - Erythematous mucosa in the ileoanal pouch. Biopsied.   Pathology results  Diagnosis 01/12/18 Rectum, biopsy, J Pouch mucosa - SMALL BOWEL TYPE MUCOSA WITH CHRONIC INFLAMMATION - NO GRANULOMATA, DYSPLASIA, OR MALIGNANCY IDENTIFIED   06/25/2019 Imaging   CT CAP IMPRESSION: 1. Low anterior section and permanent right sided ileostomy, without evidence of recurrent or metastatic disease. Probable postoperative seroma in the presacral space, stable. 2. Mild hepatomegaly. 3. Cholelithiasis. 4.  Emphysema (ICD10-J43.9).   06/19/2020 Imaging   CT Chest  IMPRESSION: 1. Interval development of multiple small nodules in the lungs compared with 1 year ago. Given history of rectal cancer, metastatic disease is suspected. Most these nodules are very small and continued close follow-up is warranted. 2. Moderate emphysema   07/07/2020 Imaging   CT AP w contrast  IMPRESSION: 1. No evidence of metastatic disease in the abdomen or pelvis. Pulmonary nodules were better seen on dedicated CT chest 06/19/2020. 2. Hepatic steatosis. 3. Cholelithiasis. 4.  Emphysema (ICD10-J43.9).   09/08/2020 Imaging   CT chest  IMPRESSION: 1. Fluctuating bilateral pulmonary nodularity. Although several of the right lung nodule seen on the most recent study have improved/resolved,  there are at least 2 new nodules in the right lower and left upper lobes. The fluctuation suggests a possible inflammatory etiology, although metastatic disease cannot be excluded. Continued CT follow-up in 3-6 months recommended. 2. Stable small mediastinal lymph nodes, likely reactive. 3. Hepatic steatosis. 4. Right IJ Port-A-Cath tip is located inferior medially in the right atrium near the tricuspid valve, unchanged. 5. Emphysema (ICD10-J43.9).        CURRENT THERAPY:  Surveillance   INTERVAL HISTORY:  [name] is here for a follow up. He presents to the clinic with his wife for follow-up.  He saw a pulmonologist in the interim, started inhalers, his shortness of breath or exertion proved.  He still has dry cough, occasional thick phlegm in the morning.  No fever or chills, no chest pain.  He quit smoking for one month, but restarted last month.  He still smokes 1 pack a day.  He denies any abdominal pain, or bloating.  He has intermittent diarrhea with loose watery stool, is able to drink fluids adequately, no dizziness.  He still has mild to moderate fatigue, able to function well including yard work.  No other new complaints.  All other systems were reviewed with the patient and are negative.  MEDICAL HISTORY:  Past Medical History:  Diagnosis Date  . Anxiety   . C. difficile diarrhea 03/30/2018  . Colon cancer ([place]) 12/01/2016  . COPD (chronic obstructive pulmonary disease) ([place])   . Depression   . Family history of colon cancer   . 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 In families with MSH6 mutations, screening can start at age 1 years since the onset of colon cancer is later in these families.   Annual screening for endome  . Poor dental hygiene   . Rectal adenocarcinoma s/p protctocolectomy, IPAA "J" pouch 12/01/2016 08/06/2016  . 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 COLON SURGERY    . EUS N/A 07/29/2016   Procedure: LOWER ENDOSCOPIC ULTRASOUND (EUS);  Surgeon: Milus Banister, MD;  Location: [facility];  Service: Endoscopy;  Laterality: N/A;  . ILEOSTOMY CLOSURE N/A 09/22/2017   Procedure: TAKEDOWN OF LOOP ILEOSTOMY;  Surgeon: Michael Boston, MD;  Location: [facility];  Service: General;  Laterality: N/A;  . LYSIS OF ADHESION N/A 05/10/2018   Procedure: LYSIS OF ADHESION;  Surgeon: Michael Boston, MD;  Location: [facility];  Service: General;  Laterality: N/A;  . PORTACATH PLACEMENT N/A 01/26/2017   Procedure: PLACEMENT OF PORT-A-CATH CENTRAL LINE WITH FLUOROSCOPY AND ULTRASOUND;  Surgeon: Michael Boston, MD;  Location: [facility];  Service: General;  Laterality: N/A;  . POUCHOSCOPY N/A 09/21/2017   Procedure: POUCHOSCOPY;  Surgeon: Leighton Ruff, MD;  Location: [facility];  Service: Endoscopy;  Laterality: N/A;  . POUCHOSCOPY N/A 01/12/2018   Procedure: POUCHOSCOPY WITH BIOPSIES;  Surgeon: Leighton Ruff, MD;  Location: [facility];  Service: Endoscopy;  Laterality: N/A;  Local  . PROCTOSCOPY N/A 12/01/2016   Procedure: RIGID PROCTOSCOPY;  Surgeon: Michael Boston, MD;  Location: [facility];  Service: General;  Laterality: N/A;  . RECTAL EXAM UNDER ANESTHESIA N/A 05/10/2018   Procedure: RECTAL EXAM UNDER ANESTHESIA;  Surgeon: Michael Boston, MD;  Location: [facility];  Service: General;  Laterality: N/A;  . TOE AMPUTATION Left   . XI ROBOTIC ASSISTED LOWER ANTERIOR RESECTION N/A 12/01/2016   Procedure: XI ROBOTIC ASSISTED PROCTOCOLECTOMY WITH ILLEOPOUCH ANASTAMOSIS WITH DIVERTING ILLEOSTOMY;  Surgeon: Michael Boston, MD;  Location: [facility];  Service: General;  Laterality: N/A;  . XI ROBOTIC ASSISTED LOWER ANTERIOR RESECTION N/A 05/10/2018   Procedure: XI ROBOTIC RESECTION OF ILEAL J POUCH, LYSIS OF ADHESIONS, WITH CREATION OF PERMANENT END ILEOSTOMY.;  Surgeon: Michael Boston, MD;  Location: [facility];  Service: General;  Laterality: N/A;    I have reviewed the social history and family history with the patient and they are unchanged from previous note.  ALLERGIES:  has No Known Allergies.  MEDICATIONS:  Current Outpatient Medications  Medication Sig Dispense Refill  . albuterol (PROVENTIL) (2.5 MG/3ML) 0.083% nebulizer solution Take 3 mLs (2.5 mg total) by nebulization every 6 (six) hours as needed for wheezing or shortness of breath. 75 mL 0  . albuterol (VENTOLIN HFA) 108 (90 Base) MCG/ACT inhaler Inhale 2 puffs into the lungs every 4 (four) hours as needed for wheezing or shortness of breath. 18 g 2  . azithromycin (ZITHROMAX) 250 MG tablet Take 1 tablet (250 mg total) by mouth daily. 5 tablet 0  . budesonide-formoterol (SYMBICORT) 160-4.5 MCG/ACT inhaler Inhale 2 puffs into the lungs in the morning and at bedtime. 1 each 12  . gabapentin (NEURONTIN) 300 MG capsule Take 2  capsules (600 mg total) by mouth 3 (three) times daily. 180 capsule 6  . predniSONE (DELTASONE) 10 MG tablet Take 4 tabs daily by mouth for five days 20 tablet 0  . sertraline (ZOLOFT) 100 MG tablet Take 1 tablet (100 mg total) by mouth daily. 90 tablet 3  . SPIRIVA RESPIMAT 2.5 MCG/ACT AERS INHALE 2 PUFFS BY MOUTH INTO THE LUNGS DAILY 4 g 1   No current facility-administered medications for this visit.   Facility-Administered Medications Ordered in Other Visits  Medication Dose Route Frequency Provider Last Rate Last Admin  . sodium chloride flush (NS) 0.9 % injection 10 mL  10 mL Intravenous PRN Truitt Merle, MD   10 mL at 11/18/17 0953    PHYSICAL EXAMINATION: ECOG PERFORMANCE STATUS: 1 - Symptomatic but completely ambulatory  Vitals:   09/18/20  0929  BP: 115/80  Pulse: 89  Resp: 18  Temp: 97.7 F (36.5 C)  SpO2: 97%   Filed Weights   09/18/20 0929  Weight: 181 lb 4.8 oz (82.2 kg)    GENERAL:alert, no distress and comfortable SKIN: skin color, texture, turgor are normal, no rashes or significant lesions EYES: normal, Conjunctiva are pink and non-injected, sclera clear NECK: supple, thyroid normal size, non-tender, without nodularity LYMPH:  no palpable lymphadenopathy in the cervical, axillary  LUNGS: clear to auscultation and percussion with normal breathing effort HEART: regular rate & rhythm and no murmurs and no lower extremity edema ABDOMEN:abdomen soft, non-tender and normal bowel sounds, (+) colostomy bag with watery stool, no hematochezia. Musculoskeletal:no cyanosis of digits and no clubbing  NEURO: alert & oriented x 3 with fluent speech, no focal motor/sensory deficits  LABORATORY DATA:  I have reviewed the data as listed CBC Latest Ref Rng & Units 09/18/2020 06/26/2020 06/04/2020  WBC 4.0 - 10.5 K/uL 9.6 7.0 9.5  Hemoglobin 13.0 - 17.0 g/dL 15.5 16.7 16.6  Hematocrit 39 - 52 % 46.0 50.0 49.9  Platelets 150 - 400 K/uL 179 223 210     CMP Latest Ref Rng & Units 09/18/2020 06/26/2020 06/04/2020  Glucose 70 - 99 mg/dL 210(H) 139(H) 218(H)  BUN 6 - 20 mg/dL _0 Creatinine 0.61 - 1.24 mg/dL 0.92 0.85 0.90  Sodium 135 - 145 mmol/L 138 139 138  Potassium 3.5 - 5.1 mmol/L 3.5 4.2 4.3  Chloride 98 - 111 mmol/L 104 108 104  CO2 22 - 32 mmol/L _1 Calcium 8.9 - 10.3 mg/dL 9.1 8.8(L) 8.8  Total Protein 6.5 - 8.1 g/dL 5.8(L) 5.6(L) -  Total Bilirubin 0.3 - 1.2 mg/dL 0.3 0.2(L) -  Alkaline Phos 38 - 126 U/L 62 55 -  AST 15 - 41 U/L 30 30 -  ALT 0 - 44 U/L 42 40 -      RADIOGRAPHIC STUDIES: I have personally reviewed the radiological images as listed and agreed with the findings in the report. No results found.   ASSESSMENT & PLAN:  [name] is a 49 y.o. male with   1. 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 He was treated with neoadjuvant concurrent chemoRT, colon surgery, adjuvant chemo and ileostomy reversal. -He hadileostomyplacedagaindue to uncontrolleddiarrhea. It's manageable now.  -His 7/8/21GuardantReveal Testingwas negative for circulatingtumor cells -Since early 2021 he had progressive and worsened cough. CT Chest from 06/19/20 showsInterval development of multiple small nodules in the lungs compared with 1 year ago. Given history of rectal cancer, metastatic  disease is possible but it's not feasible to biopsy.  -His pulmonary symptoms has improved with COPD treatment and temporary quitting smoking -I personally reviewed and discussed his repeat CT chest from 09/08/20 shows overall stable nodules in bilateral lungs, some resolved, a few new nodules bone metastasis less likely.   -will continue monitor clinically.  I will see him back in 2 months, plan to repeat CT chest in 4 months  2.Multiple lung nodules, dyspnea on exertion, Cough, Emphysema -He has a history of smoking and is currentlystill smokes 1ppd. He has known cough which he attributed to smoking. He previously declined nicotine patched, but still working on quitting. -He had progressive SOB and cough in early 2021. CT chest on 06/19/20 showed interval development of multiple small b/l pulmonary nodules compared to prior year. There is concern for metastatic disease. Scan also shows moderate emphysema. -F/u with [facility] Dr. Loanne Drilling  -His symptoms have improved on Spiriva. He has not needed nebulizer recently.   3. 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  4. Anxiety, Depression,Social issues -His  family member notes he has been nervous about cancer recurrence. He notes smoking helps him but understands he should quit. -He is out of work,ondisability -I encouraged him to increase activity and stay busy to help him quit smoking.  Plan -CT scan reviewed, no high concern for lung metastasis -Lab and follow-up in 2 months.  Plan to repeat a CT chest in 4 months -flu shot today   No problem-specific Assessment & Plan notes found for this encounter.   Orders Placed This Encounter  Procedures  . [facility] (IN HOUSE-[facility])   All questions were answered. The patient knows to call the clinic with any problems, questions or concerns. No barriers to learning was detected. The total time spent in the appointment was 30 minutes.     Truitt Merle, MD 09/18/2020   I, Joslyn Devon, am acting as scribe for Truitt Merle, MD.   I have reviewed the above documentation for accuracy and completeness, and I agree with the above.

## 2020-09-18 ENCOUNTER — Inpatient Hospital Stay (HOSPITAL_BASED_OUTPATIENT_CLINIC_OR_DEPARTMENT_OTHER): Payer: Medicaid Other | Admitting: Hematology

## 2020-09-18 ENCOUNTER — Inpatient Hospital Stay: Payer: Medicaid Other

## 2020-09-18 ENCOUNTER — Encounter: Payer: Self-pay | Admitting: Hematology

## 2020-09-18 ENCOUNTER — Other Ambulatory Visit: Payer: Self-pay

## 2020-09-18 VITALS — BP 115/80 | HR 89 | Temp 97.7°F | Resp 18 | Ht 71.0 in | Wt 181.3 lb

## 2020-09-18 DIAGNOSIS — R911 Solitary pulmonary nodule: Secondary | ICD-10-CM | POA: Diagnosis not present

## 2020-09-18 DIAGNOSIS — Z23 Encounter for immunization: Secondary | ICD-10-CM | POA: Diagnosis not present

## 2020-09-18 DIAGNOSIS — F419 Anxiety disorder, unspecified: Secondary | ICD-10-CM | POA: Diagnosis not present

## 2020-09-18 DIAGNOSIS — Z1509 Genetic susceptibility to other malignant neoplasm: Secondary | ICD-10-CM

## 2020-09-18 DIAGNOSIS — C20 Malignant neoplasm of rectum: Secondary | ICD-10-CM

## 2020-09-18 DIAGNOSIS — F17210 Nicotine dependence, cigarettes, uncomplicated: Secondary | ICD-10-CM | POA: Diagnosis not present

## 2020-09-18 DIAGNOSIS — J439 Emphysema, unspecified: Secondary | ICD-10-CM | POA: Diagnosis not present

## 2020-09-18 DIAGNOSIS — F329 Major depressive disorder, single episode, unspecified: Secondary | ICD-10-CM | POA: Diagnosis not present

## 2020-09-18 LAB — COMPREHENSIVE METABOLIC PANEL
ALT: 42 U/L (ref 0–44)
AST: 30 U/L (ref 15–41)
Albumin: 2.8 g/dL — ABNORMAL LOW (ref 3.5–5.0)
Alkaline Phosphatase: 62 U/L (ref 38–126)
Anion gap: 9 (ref 5–15)
BUN: 11 mg/dL (ref 6–20)
CO2: 25 mmol/L (ref 22–32)
Calcium: 9.1 mg/dL (ref 8.9–10.3)
Chloride: 104 mmol/L (ref 98–111)
Creatinine, Ser: 0.92 mg/dL (ref 0.61–1.24)
GFR calc Af Amer: >60 mL/min (ref >60)
GFR calc non Af Amer: >60 mL/min (ref >60)
Glucose, Bld: 210 mg/dL — ABNORMAL HIGH (ref 70–99)
Potassium: 3.5 mmol/L (ref 3.5–5.1)
Sodium: 138 mmol/L (ref 135–145)
Total Bilirubin: 0.3 mg/dL (ref 0.3–1.2)
Total Protein: 5.8 g/dL — ABNORMAL LOW (ref 6.5–8.1)

## 2020-09-18 LAB — CBC WITH DIFFERENTIAL/PLATELET
Abs Immature Granulocytes: 0.31 10*3/uL — ABNORMAL HIGH (ref 0.00–0.07)
Basophils Absolute: 0.1 10*3/uL (ref 0.0–0.1)
Basophils Relative: 1 %
Eosinophils Absolute: 0.2 10*3/uL (ref 0.0–0.5)
Eosinophils Relative: 2 %
HCT: 46 % (ref 39.0–52.0)
Hemoglobin: 15.5 g/dL (ref 13.0–17.0)
Immature Granulocytes: 3 %
Lymphocytes Relative: 15 %
Lymphs Abs: 1.4 10*3/uL (ref 0.7–4.0)
MCH: 29.5 pg (ref 26.0–34.0)
MCHC: 33.7 g/dL (ref 30.0–36.0)
MCV: 87.6 fL (ref 80.0–100.0)
Monocytes Absolute: 0.9 10*3/uL (ref 0.1–1.0)
Monocytes Relative: 9 %
Neutro Abs: 6.7 10*3/uL (ref 1.7–7.7)
Neutrophils Relative %: 70 %
Platelets: 179 10*3/uL (ref 150–400)
RBC: 5.25 MIL/uL (ref 4.22–5.81)
RDW: 13.9 % (ref 11.5–15.5)
WBC: 9.6 10*3/uL (ref 4.0–10.5)
nRBC: 0 % (ref 0.0–0.2)

## 2020-09-18 LAB — CEA (IN HOUSE-CHCC): CEA (CHCC-In House): 1.88 ng/mL (ref 0.00–5.00)

## 2020-09-18 MED ORDER — INFLUENZA VAC SPLIT QUAD 0.5 ML IM SUSY
PREFILLED_SYRINGE | INTRAMUSCULAR | Status: AC
  Filled 2020-09-18: qty 0.5

## 2020-09-18 MED ORDER — INFLUENZA VAC SPLIT QUAD 0.5 ML IM SUSY
0.5000 mL | PREFILLED_SYRINGE | Freq: Once | INTRAMUSCULAR | Status: AC
  Administered 2020-09-18: 0.5 mL via INTRAMUSCULAR

## 2020-09-25 ENCOUNTER — Other Ambulatory Visit: Payer: Self-pay

## 2020-09-25 ENCOUNTER — Ambulatory Visit (INDEPENDENT_AMBULATORY_CARE_PROVIDER_SITE_OTHER): Payer: Medicaid Other | Admitting: Family Medicine

## 2020-09-25 VITALS — BP 102/78 | HR 83 | Ht 71.0 in | Wt 182.4 lb

## 2020-09-25 DIAGNOSIS — E1165 Type 2 diabetes mellitus with hyperglycemia: Secondary | ICD-10-CM | POA: Diagnosis not present

## 2020-09-25 DIAGNOSIS — R5383 Other fatigue: Secondary | ICD-10-CM | POA: Diagnosis not present

## 2020-09-25 DIAGNOSIS — R7309 Other abnormal glucose: Secondary | ICD-10-CM | POA: Diagnosis not present

## 2020-09-25 LAB — GLUCOSE, POCT (MANUAL RESULT ENTRY): POC Glucose: 235 mg/dl — AB (ref 70–99)

## 2020-09-25 LAB — POCT GLYCOSYLATED HEMOGLOBIN (HGB A1C): Hemoglobin A1C: 8.2 % — AB (ref 4.0–5.6)

## 2020-09-25 MED ORDER — ACCU-CHEK SOFTCLIX LANCETS MISC: 12 refills | Status: AC

## 2020-09-25 MED ORDER — ACCU-CHEK GUIDE VI STRP: ORAL_STRIP | 12 refills | Status: DC

## 2020-09-25 MED ORDER — METFORMIN HCL 500 MG PO TABS: 500.0000 mg | ORAL_TABLET | Freq: Every day | ORAL | 0 refills | Status: DC

## 2020-09-25 MED ORDER — ACCU-CHEK GUIDE W/DEVICE KIT: 1.0000 | PACK | Freq: Once | 0 refills | Status: AC

## 2020-09-25 NOTE — Assessment & Plan Note (Signed)
Likely secondary to untreated diabetes, will add on TSH however to his labs to rule out thyroid dysfunction.

## 2020-09-25 NOTE — Patient Instructions (Signed)
Thank you for coming to see me today. It was a pleasure. Today we talked about:   You have diabetes.  We will start you on Metformin 500 mg once daily.  Most common side effect is GI upset, therefore we will start slow.  I will give you some information about a diabetic diet to try to follow.  You should check your sugars every morning and keep a log.  Please follow-up with me in 1 month  If you have any questions or concerns, please do not hesitate to call the office at [phone].  Best,   Arizona Constable, DO   Diet Recommendations for Diabetes  Carbohydrate includes starch, sugar, and fiber.  Of these, only sugar and starch raise blood glucose.  (Fiber is found in fruits, vegetables [especially skin, seeds, and stalks], whole grains, and beans.)   Starchy (carb) foods: Bread, rice, pasta, potatoes, corn, cereal, grits, crackers, bagels, muffins, all baked goods.  (Fruit, milk, and yogurt also have carbohydrate, but most of these foods will not spike your blood sugar as most starchy foods will.)  A few fruits do cause high blood sugars; use small portions of bananas (limit to 1/2 at a time), grapes, watermelon, oranges, and most tropical fruits.   Protein foods: Meat, fish, poultry, eggs, dairy foods, and beans such as pinto and kidney beans (beans also provide carbohydrate).   1. Eat at least 3 REAL meals and 1-2 snacks per day. Eat breakfast within the first hour of getting up.  Have something to eat at least every 5 hours while awake.   2. Limit starchy foods to TWO per meal and ONE per snack. ONE portion of a starchy food is equal to the following:   - ONE slice of bread (or its equivalent, such as half of a hamburger bun).   - 1/2 cup of a "scoopable" starchy food such as potatoes or rice.   - 15 grams of Total Carbohydrate as shown on food label.   - Every 4 ounces of a sweet drink (including fruit juice). 3. Include at every lunch and dinner: a protein food, a carb food, and  vegetables.   - Obtain twice the volume of veg's as protein or carbohydrate foods for both lunch and dinner.   - Fresh or frozen vegetables are best.   - Keep frozen vegetables on hand for a quick option.       Diabetes Basics  Diabetes (diabetes mellitus) is a long-term (chronic) disease. It occurs when the body does not properly use sugar (glucose) that is released from food after you eat. Diabetes may be caused by one or both of these problems:  Your pancreas does not make enough of a hormone called insulin.  Your body does not react in a normal way to insulin that it makes. Insulin lets sugars (glucose) go into cells in your body. This gives you energy. If you have diabetes, sugars cannot get into cells. This causes high blood sugar (hyperglycemia). Follow these instructions at home: How is diabetes treated? You may need to take insulin or other diabetes medicines daily to keep your blood sugar in balance. Take your diabetes medicines every day as told by your doctor. List your diabetes medicines here: Diabetes medicines  Name of medicine: ______________________________ ? Amount (dose): _______________ Time (a.m./p.m.): _______________ Notes: ___________________________________  Name of medicine: ______________________________ ? Amount (dose): _______________ Time (a.m./p.m.): _______________ Notes: ___________________________________  Name of medicine: ______________________________ ? Amount (dose): _______________ Time (a.m./p.m.): _______________ Notes: ___________________________________  If you use insulin, you will learn how to give yourself insulin by injection. You may need to adjust the amount based on the food that you eat. List the types of insulin you use here: Insulin  Insulin type: ______________________________ ? Amount (dose): _______________ Time (a.m./p.m.): _______________ Notes: ___________________________________  Insulin type:  ______________________________ ? Amount (dose): _______________ Time (a.m./p.m.): _______________ Notes: ___________________________________  Insulin type: ______________________________ ? Amount (dose): _______________ Time (a.m./p.m.): _______________ Notes: ___________________________________  Insulin type: ______________________________ ? Amount (dose): _______________ Time (a.m./p.m.): _______________ Notes: ___________________________________  Insulin type: ______________________________ ? Amount (dose): _______________ Time (a.m./p.m.): _______________ Notes: ___________________________________ How do I manage my blood sugar?  Check your blood sugar levels using a blood glucose monitor as directed by your doctor. Your doctor will set treatment goals for you. Generally, you should have these blood sugar levels:  Before meals (preprandial): 80-130 mg/dL (4.4-7.2 mmol/L).  After meals (postprandial): below 180 mg/dL (10 mmol/L).  A1c level: less than 7%. Write down the times that you will check your blood sugar levels: Blood sugar checks  Time: _______________ Notes: ___________________________________  Time: _______________ Notes: ___________________________________  Time: _______________ Notes: ___________________________________  Time: _______________ Notes: ___________________________________  Time: _______________ Notes: ___________________________________  Time: _______________ Notes: ___________________________________  What do I need to know about low blood sugar? Low blood sugar is called hypoglycemia. This is when blood sugar is at or below 70 mg/dL (3.9 mmol/L). Symptoms may include:  Feeling: ? Hungry. ? Worried or nervous (anxious). ? Sweaty and clammy. ? Confused. ? Dizzy. ? Sleepy. ? Sick to your stomach (nauseous).  Having: ? A fast heartbeat. ? A headache. ? A change in your vision. ? Tingling or no feeling (numbness) around the mouth, lips, or  tongue. ? Jerky movements that you cannot control (seizure).  Having trouble with: ? Moving (coordination). ? Sleeping. ? Passing out (fainting). ? Getting upset easily (irritability). Treating low blood sugar To treat low blood sugar, eat or drink something sugary right away. If you can think clearly and swallow safely, follow the 15:15 rule:  Take 15 grams of a fast-acting carb (carbohydrate). Talk with your doctor about how much you should take.  Some fast-acting carbs are: ? Sugar tablets (glucose pills). Take 3-4 glucose pills. ? 6-8 pieces of hard candy. ? 4-6 oz (120-150 mL) of fruit juice. ? 4-6 oz (120-150 mL) of regular (not diet) soda. ? 1 Tbsp (15 mL) honey or sugar.  Check your blood sugar 15 minutes after you take the carb.  If your blood sugar is still at or below 70 mg/dL (3.9 mmol/L), take 15 grams of a carb again.  If your blood sugar does not go above 70 mg/dL (3.9 mmol/L) after 3 tries, get help right away.  After your blood sugar goes back to normal, eat a meal or a snack within 1 hour. Treating very low blood sugar If your blood sugar is at or below 54 mg/dL (3 mmol/L), you have very low blood sugar (severe hypoglycemia). This is an emergency. Do not wait to see if the symptoms will go away. Get medical help right away. Call your local emergency services (911 in the [place] Do not drive yourself to the hospital. Questions to ask your health care provider  Do I need to meet with a diabetes educator?  What equipment will I need to care for myself at home?  What diabetes medicines do I need? When should I take them?  How often do I need to check my blood sugar?  What number can I call if I have questions?  When is my next doctor's visit?  Where can I find a support group for people with diabetes? Where to find more information  American Diabetes Association: www.diabetes.org  American Association of Diabetes Educators:  www.diabeteseducator.org/patient-resources Contact a doctor if:  Your blood sugar is at or above 240 mg/dL (13.3 mmol/L) for 2 days in a row.  You have been sick or have had a fever for 2 days or more, and you are not getting better.  You have any of these problems for more than 6 hours: ? You cannot eat or drink. ? You feel sick to your stomach (nauseous). ? You throw up (vomit). ? You have watery poop (diarrhea). Get help right away if:  Your blood sugar is lower than 54 mg/dL (3 mmol/L).  You get confused.  You have trouble: ? Thinking clearly. ? Breathing. Summary  Diabetes (diabetes mellitus) is a long-term (chronic) disease. It occurs when the body does not properly use sugar (glucose) that is released from food after digestion.  Take insulin and diabetes medicines as told.  Check your blood sugar every day, as often as told.  Keep all follow-up visits as told by your doctor. This is important. This information is not intended to replace advice given to you by your health care provider. Make sure you discuss any questions you have with your health care provider. Document Revised: 08/29/2019 Document Reviewed: 03/10/2018 Elsevier Patient Education  [place].

## 2020-09-25 NOTE — Assessment & Plan Note (Signed)
New diagnosis of diabetes consistent with fasting CBG this a.m. as well as A1c.  He has also had previous values that have been in the 200s, was not fasting at that time, but does meet criteria for diagnosis.  He has a family history of diabetes.  Given his size, it is possible that he is not making insulin, but will start off with treatment for Metformin.  Given his history of colon cancer and current colostomy bag, will start slow at Metformin 500 mg daily with hopes to increase as tolerated.  In the future, would benefit from statin and ACE/ARB, but do not want to overwhelm patient today with new medications.  He was also prescribed glucose monitoring kit and advised to check his sugars daily.  He will do this, keep a log, return in 4 weeks.  Can make adjustments from there.  Will not repeat CMP or CBC as this was just obtained on 9/30.  Will obtain lipid panel today.  He was given information on a diabetic diet.

## 2020-09-25 NOTE — Progress Notes (Signed)
    SUBJECTIVE:   CHIEF COMPLAINT / HPI:   Elevated blood glucose Patient has had 2 elevated blood glucoses on recent CMP use at cancer center with levels of 210 and 218 Other values have been in the low 100s He was advised by his oncologist to come in for work-up for possible diabetes Mother was a diabetic Patient thinks that he had eaten within an hour before taking his blood the most recent time, doesn't think they have ever done fasting labs Feeling fatigued lately, having more frequent headaches, polyuria, polydipsia   PERTINENT  PMH / PSH: Emphysema/COPD, rectal adenocarcinoma s/p proctocolectomy, chronic diarrhea, anxiety, Lynch syndrome, tobacco abuse, depression  OBJECTIVE:   BP 102/78   Pulse 83   Ht $R'5\' 11"'bU$  (2.703 m)   Wt 182 lb 6.4 oz (82.7 kg)   SpO2 96%   BMI 25.44 kg/m    Physical Exam:  General: 49 y.o. male in NAD Cardio: RRR no m/r/g Lungs: Decreased air movement throughout all lung fields, no increased work of breathing on room air Abdomen: Colostomy bag in place Skin: warm and dry Extremities: No edema, ambulating without difficulty  Results for orders placed or performed in visit on 09/25/20 (from the past 24 hour(s))  POCT glycosylated hemoglobin (Hb A1C)     Status: Abnormal   Collection Time: 09/25/20  9:19 AM  Result Value Ref Range   Hemoglobin A1C 8.2 (A) 4.0 - 5.6 %   HbA1c POC (<> result, manual entry)     HbA1c, POC (prediabetic range)     HbA1c, POC (controlled diabetic range)    Glucose (CBG)     Status: Abnormal   Collection Time: 09/25/20  9:43 AM  Result Value Ref Range   POC Glucose 235 (A) 70 - 99 mg/dl     ASSESSMENT/PLAN:   Type 2 diabetes mellitus with hyperglycemia, without long-term current use of insulin (HCC) New diagnosis of diabetes consistent with fasting CBG this a.m. as well as A1c.  He has also had previous values that have been in the 200s, was not fasting at that time, but does meet criteria for diagnosis.  He has  a family history of diabetes.  Given his size, it is possible that he is not making insulin, but will start off with treatment for Metformin.  Given his history of colon cancer and current colostomy bag, will start slow at Metformin 500 mg daily with hopes to increase as tolerated.  In the future, would benefit from statin and ACE/ARB, but do not want to overwhelm patient today with new medications.  He was also prescribed glucose monitoring kit and advised to check his sugars daily.  He will do this, keep a log, return in 4 weeks.  Can make adjustments from there.  Will not repeat CMP or CBC as this was just obtained on 9/30.  Will obtain lipid panel today.  He was given information on a diabetic diet.  Fatigue Likely secondary to untreated diabetes, will add on TSH however to his labs to rule out thyroid dysfunction.     Cleophas Dunker, [place]

## 2020-09-26 LAB — LIPID PANEL
Chol/HDL Ratio: 3.4 ratio (ref 0.0–5.0)
Cholesterol, Total: 147 mg/dL (ref 100–199)
HDL: 43 mg/dL (ref >39)
LDL Chol Calc (NIH): 79 mg/dL (ref 0–99)
Triglycerides: 140 mg/dL (ref 0–149)
VLDL Cholesterol Cal: 25 mg/dL (ref 5–40)

## 2020-09-26 LAB — TSH: TSH: 2.5 u[IU]/mL (ref 0.450–4.500)

## 2020-10-24 ENCOUNTER — Encounter: Payer: Self-pay | Admitting: Family Medicine

## 2020-10-24 ENCOUNTER — Other Ambulatory Visit: Payer: Self-pay

## 2020-10-24 ENCOUNTER — Ambulatory Visit (INDEPENDENT_AMBULATORY_CARE_PROVIDER_SITE_OTHER): Payer: Medicaid Other | Admitting: Family Medicine

## 2020-10-24 VITALS — BP 110/86 | HR 84 | Ht 71.0 in | Wt 180.6 lb

## 2020-10-24 DIAGNOSIS — E785 Hyperlipidemia, unspecified: Secondary | ICD-10-CM

## 2020-10-24 DIAGNOSIS — E1169 Type 2 diabetes mellitus with other specified complication: Secondary | ICD-10-CM | POA: Insufficient documentation

## 2020-10-24 DIAGNOSIS — E1165 Type 2 diabetes mellitus with hyperglycemia: Secondary | ICD-10-CM

## 2020-10-24 LAB — GLUCOSE, POCT (MANUAL RESULT ENTRY): POC Glucose: 245 mg/dl — AB (ref 70–99)

## 2020-10-24 MED ORDER — ATORVASTATIN CALCIUM 20 MG PO TABS: 20.0000 mg | ORAL_TABLET | Freq: Every day | ORAL | 3 refills | Status: DC

## 2020-10-24 MED ORDER — METFORMIN HCL 500 MG PO TABS: 500.0000 mg | ORAL_TABLET | Freq: Two times a day (BID) | ORAL | 2 refills | Status: DC

## 2020-10-24 NOTE — Progress Notes (Signed)
° ° °  SUBJECTIVE:   CHIEF COMPLAINT / HPI:   T2DM Patient diagnosed on 10/7 elevated glucoses greater than 200 at cancer center readings and an A1c of 8.2 He was started on Metformin 500 mg daily, doing well with this CBG 200s-300s in the AM Lipid panel obtained at that time, LDL 79 Not currently on statin or ACE/ARB CBG 245 this AM in office  PERTINENT  PMH / PSH: Emphysema/COPD, rectal adenocarcinoma s/p proctocolectomy, chronic diarrhea, anxiety, Lynch syndrome, tobacco abuse, depression  OBJECTIVE:   BP 110/86    Pulse 84    Ht 5\' 11"  (1.803 m)    Wt 180 lb 9.6 oz (81.9 kg)    SpO2 96%    BMI 25.19 kg/m    Physical Exam:  General: 49 y.o. male in NAD Lungs: Breathing comfortably on room air Skin: warm and dry Extremities: No edema, ambulating well  ASSESSMENT/PLAN:   Type 2 diabetes mellitus with hyperglycemia, without long-term current use of insulin (HCC) CBGs still above range.  Continue to work on increasing Metformin slowly to ensure no additional GI upset.  Will increase Metformin to 500 mg twice daily.  Advised patient and his partner that they could increase Metformin to 1000 mg in the morning and 500 in the evening prior to the next visit if they felt comfortable and he was tolerating this increase well.  Will obtain BMP today.  Also advised to follow-up with eye doctor for diabetic eye exam.  They would like to work on scheduling this on their own for now.  We can send referral if needed.  Hyperlipidemia associated with type 2 diabetes mellitus ([place]) Last lipid panel in October.  LDL 79.  Goal less than 70.  Discussed with patient importance of statin with diabetes.  We will go ahead and start him on Lipitor 20 mg daily.  Can recheck LDL in 3 months to ensure appropriate decrease.     Cleophas Dunker, [place]

## 2020-10-24 NOTE — Assessment & Plan Note (Signed)
CBGs still above range.  Continue to work on increasing Metformin slowly to ensure no additional GI upset.  Will increase Metformin to 500 mg twice daily.  Advised patient and his partner that they could increase Metformin to 1000 mg in the morning and 500 in the evening prior to the next visit if they felt comfortable and he was tolerating this increase well.  Will obtain BMP today.  Also advised to follow-up with eye doctor for diabetic eye exam.  They would like to work on scheduling this on their own for now.  We can send referral if needed.

## 2020-10-24 NOTE — Assessment & Plan Note (Addendum)
Last lipid panel in October.  LDL 79.  Goal less than 70.  Discussed with patient importance of statin with diabetes.  We will go ahead and start him on Lipitor 20 mg daily.  Can recheck LDL in 3 months to ensure appropriate decrease.

## 2020-10-24 NOTE — Patient Instructions (Addendum)
Thank you for coming to see me today. It was a pleasure. Today we talked about:   We will get some labs today.  If they are abnormal or we need to do something about them, I will call you.  If they are normal, I will send you a message on MyChart (if it is active) or a letter in the mail.  If you don't hear from [place] in 2 weeks, please call the office at the number below.  Increase metformin to 500mg  twice daily.  In two weeks, if you are still doing well with this, you can increase to 1000mg  in the morning and 500mg  at night.    We will also start you on a cholesterol medication.  Schedule an appointment with an eye doctor and have then fax our office the results [phone].  Please follow-up with me in 1 month.  If you have any questions or concerns, please do not hesitate to call the office at [phone].  Best,   Arizona Constable, DO

## 2020-10-25 LAB — BASIC METABOLIC PANEL
BUN/Creatinine Ratio: 15 (ref 9–20)
BUN: 14 mg/dL (ref 6–24)
CO2: 18 mmol/L — ABNORMAL LOW (ref 20–29)
Calcium: 9.1 mg/dL (ref 8.7–10.2)
Chloride: 106 mmol/L (ref 96–106)
Creatinine, Ser: 0.95 mg/dL (ref 0.76–1.27)
GFR calc Af Amer: 107 mL/min/{1.73_m2} (ref >59)
GFR calc non Af Amer: 93 mL/min/{1.73_m2} (ref >59)
Glucose: 240 mg/dL — ABNORMAL HIGH (ref 65–99)
Potassium: 4.8 mmol/L (ref 3.5–5.2)
Sodium: 139 mmol/L (ref 134–144)

## 2020-11-03 ENCOUNTER — Other Ambulatory Visit: Payer: Self-pay | Admitting: *Deleted

## 2020-11-04 MED ORDER — SPIRIVA RESPIMAT 2.5 MCG/ACT IN AERS
INHALATION_SPRAY | RESPIRATORY_TRACT | 1 refills | Status: DC
Start: 2020-11-04 — End: 2021-01-22

## 2020-11-24 ENCOUNTER — Encounter: Payer: Self-pay | Admitting: Family Medicine

## 2020-11-24 ENCOUNTER — Other Ambulatory Visit: Payer: Self-pay | Admitting: Family Medicine

## 2020-11-24 DIAGNOSIS — R0689 Other abnormalities of breathing: Secondary | ICD-10-CM

## 2020-11-24 MED ORDER — ALBUTEROL SULFATE HFA 108 (90 BASE) MCG/ACT IN AERS: 2.0000 | INHALATION_SPRAY | RESPIRATORY_TRACT | 2 refills | Status: DC | PRN

## 2020-11-25 ENCOUNTER — Ambulatory Visit (INDEPENDENT_AMBULATORY_CARE_PROVIDER_SITE_OTHER): Payer: Medicaid Other | Admitting: Family Medicine

## 2020-11-25 ENCOUNTER — Encounter: Payer: Self-pay | Admitting: Family Medicine

## 2020-11-25 ENCOUNTER — Other Ambulatory Visit: Payer: Self-pay

## 2020-11-25 VITALS — BP 101/80 | HR 91 | Ht 71.0 in | Wt 182.4 lb

## 2020-11-25 DIAGNOSIS — Z1159 Encounter for screening for other viral diseases: Secondary | ICD-10-CM | POA: Diagnosis not present

## 2020-11-25 DIAGNOSIS — E1165 Type 2 diabetes mellitus with hyperglycemia: Secondary | ICD-10-CM | POA: Diagnosis not present

## 2020-11-25 DIAGNOSIS — R197 Diarrhea, unspecified: Secondary | ICD-10-CM | POA: Diagnosis not present

## 2020-11-25 DIAGNOSIS — J432 Centrilobular emphysema: Secondary | ICD-10-CM

## 2020-11-25 DIAGNOSIS — Z720 Tobacco use: Secondary | ICD-10-CM | POA: Diagnosis not present

## 2020-11-25 DIAGNOSIS — R202 Paresthesia of skin: Secondary | ICD-10-CM | POA: Insufficient documentation

## 2020-11-25 NOTE — Patient Instructions (Signed)
Thank you for coming to see me today. It was a pleasure. Today we talked about:   Let us know if you want help quitting.  I have placed an order for x-rays of your neck.  Please go to [place] at [facility] to have this completed.  You do not need an appointment.  We will contact you with your results afterwards.  We can decide from there what we like to do.  We will get some labs today.  If they are abnormal or we need to do something about them, I will call you.  If they are normal, I will send you a message on MyChart (if it is active) or a letter in the mail.  If you don't hear from [place] in 2 weeks, please call the office at the number below.  Please message me your glucose results.  Decrease your metformin to 500mg  daily because of your diarrhea.  Please follow-up with me in 1 month.  If you have any questions or concerns, please do not hesitate to call the office at [phone].  Best,   Arizona Constable, DO

## 2020-11-25 NOTE — Assessment & Plan Note (Signed)
Seems to be stable today.  Continue Spiriva and Symbicort.  Albuterol was refilled yesterday for patient.  Discussed tobacco cessation.  See below.  Would recommend follow-up with pulmonology be scheduled at some point in the next few months

## 2020-11-25 NOTE — Progress Notes (Signed)
SUBJECTIVE:   CHIEF COMPLAINT / HPI:   New onset T2DM Diagnosed on 10/7 A1c 8.2 at that visit Current regimen: Metformin 1000 mg every morning Eye doctor, has appt scheduled  CBGs have been checked, but wife does it and she isn't here Was started on statin on 11/5, LDL 79 in October  Fluid in Ileostomy bag Last week, he had about 3 days of increased fluid in his colostomy bag He worries about dehydration and tries to drink a lot of water Doesn't know if he has follow up with GI Today he is fine No fevers Not sure if it has worsened with metformin Denies pain, just has to go more than usual  Cramping in his left arm and side Has been happening the last few weeks Starts in the left side of his neck and comes down Has had something like this before He was given a muscle relaxer, but it didn't really help him He states that it gradually stopped Tries a heating pad on neck and shoulders which helps  Not worsening with overhead activity Turning a certain way to his side, he feels some pain in his side Hasn't tried any medications for it He is right handed He has chronic neuropathy in both hands  Has tingling down his left arm occasionally when this pain No known history of neck problems No recent injuries that he is aware of Had something similar in 2018, had a negative shoulder XR  Shortness of breath Seen by pulmonology on 08/14/2020 Known history of emphysema Was treated for an acute exacerbation in August Current regimen: Spiriva, Symbicort, albuterol as needed States that his breathing isn't the best, not better, but not worse He is smoking again Doesn't have a f/u scheduled with pulm  Tobacco abuse Smoking 1 ppd Having trouble quitting Had worked with Dr. Valentina Lucks previously and was able to quit States that he needs to come to this decision on his own   PERTINENT  PMH / PSH: Emphysema/COPD, rectal adenocarcinoma s/p proctocolectomy, chronic diarrhea, anxiety,  Lynch syndrome, tobacco abuse, depression  OBJECTIVE:   BP 101/80   Pulse 91   Ht 5\' 11"  (1.803 m)   Wt 182 lb 6.4 oz (82.7 kg)   SpO2 98%   BMI 25.44 kg/m    Physical Exam:  General: 49 y.o. male in NAD Lungs: Breathing comfortably on RA Skin: warm and dry  Left Shoulder: Inspection reveals no obvious deformity, atrophy, or asymmetry b/l. No bruising. No swelling b/l Palpation is normal with no TTP over [facility] joint or bicipital groove b/l Full ROM in flexion, abduction, internal/external rotation b/l NV intact distally b/l Special Tests:  - Impingement: Positive Michel Bickers and pain with empty can on left. - Supraspinatous: Equivocal empty can on left.   - Infraspinatous/Teres Minor: 5/5 strength with ER b/l - Subscapularis: 5/5 strength with IR b/l - Biceps tendon: Negative Speeds - Labrum: Negative Obriens - AC Joint: Negative cross arm - Negative apprehension test - No painful arc and no drop arm sign  Neck exam: Inspection: No obvious deformity of cervical spine. Palpation: No tenderness to palpation along cervical spine, paraspinal musculature.  No step-off noted. ROM: Full ROM in all fields without pain. Special tests: Spurling's negative bilaterally     ASSESSMENT/PLAN:   Type 2 diabetes mellitus with hyperglycemia, without long-term current use of insulin ([place]) Patient will have his wife message CBGs so that adjustments can be made.  With increase in Metformin, concern for worsening of  diarrhea, see below.  We will go ahead and decrease Metformin to 500 mg every morning.  He has an appointment scheduled for an eye doctor.  On a statin and doing well.  Obtaining a BMP today in the setting of cramping and paresthesias in his left arm.  Return in 1 month.  Diarrhea Could be secondary to ileostomy in general.  Given that it has worsened however after increasing Metformin, will go ahead and decrease to 500 mg.  He is otherwise been feeling well and it has now  resolved today, therefore doubt infectious process.  We will follow-up how he does with a decrease in Metformin.  Paresthesia of left arm In speaking with him, his symptoms seem to be most consistent with a neck process and less likely a shoulder process especially since they do not worsen with overhead activity.  Would start with getting cervical spine imaging.  Could also consider metastasis to the bone, although this would be much less likely, but should be considered at some point given his history of cancer.  He has some impingement findings on examination, but his symptom complaints do not seem to fit with this given that he is having some paresthesias.  We will start with cervical spine plain films.  If he does not seem to have cervical spine pathology, did advise that we could consider corticosteroid injection in his shoulder, physical therapy, referral to sports medicine for ultrasound to further assess his rotator cuff.  He would like to hold off on any of these considerations.  He declines medications today.  Centrilobular emphysema ([place]) Seems to be stable today.  Continue Spiriva and Symbicort.  Albuterol was refilled yesterday for patient.  Discussed tobacco cessation.  See below.  Would recommend follow-up with pulmonology be scheduled at some point in the next few months  Tobacco abuse Patient declines assistance with quitting smoking at this point.  He has quit line information that had previously been given to him.  He also is aware that he could speak with Dr. Valentina Lucks and the pharmacy team if he would like as he has been successful with them in the past.  He states that he needs to do this on his own.  We will continue to follow-up.  This is likely contributing to his continued shortness of breath in the setting of emphysema and COPD.     [name], [place]

## 2020-11-25 NOTE — Assessment & Plan Note (Signed)
Could be secondary to ileostomy in general.  Given that it has worsened however after increasing Metformin, will go ahead and decrease to 500 mg.  He is otherwise been feeling well and it has now resolved today, therefore doubt infectious process.  We will follow-up how he does with a decrease in Metformin.

## 2020-11-25 NOTE — Assessment & Plan Note (Signed)
Patient will have his wife message CBGs so that adjustments can be made.  With increase in Metformin, concern for worsening of diarrhea, see below.  We will go ahead and decrease Metformin to 500 mg every morning.  He has an appointment scheduled for an eye doctor.  On a statin and doing well.  Obtaining a BMP today in the setting of cramping and paresthesias in his left arm.  Return in 1 month.

## 2020-11-25 NOTE — Assessment & Plan Note (Signed)
Patient declines assistance with quitting smoking at this point.  He has quit line information that had previously been given to him.  He also is aware that he could speak with Dr. Valentina Lucks and the pharmacy team if he would like as he has been successful with them in the past.  He states that he needs to do this on his own.  We will continue to follow-up.  This is likely contributing to his continued shortness of breath in the setting of emphysema and COPD.

## 2020-11-25 NOTE — Assessment & Plan Note (Signed)
In speaking with him, his symptoms seem to be most consistent with a neck process and less likely a shoulder process especially since they do not worsen with overhead activity.  Would start with getting cervical spine imaging.  Could also consider metastasis to the bone, although this would be much less likely, but should be considered at some point given his history of cancer.  He has some impingement findings on examination, but his symptom complaints do not seem to fit with this given that he is having some paresthesias.  We will start with cervical spine plain films.  If he does not seem to have cervical spine pathology, did advise that we could consider corticosteroid injection in his shoulder, physical therapy, referral to sports medicine for ultrasound to further assess his rotator cuff.  He would like to hold off on any of these considerations.  He declines medications today.

## 2020-12-16 ENCOUNTER — Other Ambulatory Visit: Payer: Medicaid Other

## 2020-12-18 ENCOUNTER — Ambulatory Visit: Payer: Medicaid Other | Admitting: Hematology

## 2020-12-22 NOTE — Progress Notes (Incomplete)
[name]   [phone] [phone]   Clinic Follow up Note   Patient Care Team: Meccariello, Bernita Raisin, DO as PCP - General (Family Medicine) Michael Boston, MD as Consulting Physician (General Surgery) [place], Kirke Corin, MD as Consulting Physician (Gastroenterology) Kyung Rudd, MD as Consulting Physician (Radiation Oncology) Truitt Merle, MD as Consulting Physician (Oncology) Margaretha Seeds, MD as Consulting Physician (Pulmonary Disease)  Date of Service:  12/22/2020  CHIEF COMPLAINT: Follow up rectal cancer  SUMMARY OF ONCOLOGIC HISTORY: Oncology History Overview Note  Rectal adenocarcinoma [facility])   Staging form: Colon and Rectum, AJCC 7th Edition   - Clinical stage from 07/28/2016: Stage IIIB (T3, N2a, M0) - Signed by Truitt Merle, MD on 08/06/2016    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  07/26/2016 Imaging   CT chest, abdomen and pelvis with contrast showed nodular thickening of the rectal wall, enlarged perirectal lymph nodes. Diverticulosis. Nonspecific low-level lymphadenopathy. Lower lobe emphysema, small 3-4 mm left lower lobe lung nodules.   07/28/2016 Initial Diagnosis   Rectal adenocarcinoma ([place])   07/28/2016 Initial Biopsy   Rectal mass biopsy showed invasive adenocarcinoma, polyps in the ascending colon and rectum showed tubular adenoma    07/28/2016 Procedure   Colonoscopy showed a nearly circumferential mass in the distal rectum, 4 polyps in the descending colon, diverticulosis in the left colon.    07/29/2016 Procedure   Low EUS showed clearly malignant 6 cm long, nearly circumferential mass in the distal rectum, with distal edge located to 1 cm from the annual approach, multiple prior rectal lymph nodes (6) are suspicious ,uT3 N2    08/17/2016 - 09/24/2016 Radiation Therapy   Neoadjuvant radiation to his rectal cancer 1) Rectum/ 45 Gy in 25 fx                      2) Boost/ 5.4 Gy in 3 fx    08/17/2016 - 09/24/2016  Chemotherapy   Xeloda 1500 mg twice daily with concurrent irradiation   08/18/2016 Imaging   PET scan showed hypermetabolic rectal mass, perirectal node 58m with mild increased uptake, no hypermetabolic distant metastasis, small pulmonary nodules are too small to characterize by PET.    11/29/2016 Imaging   CT of the C/A/P W Contrast IMPRESSION: 1. Interval treatment changes within the perirectal fat with otherwise stable rectosigmoid colon wall thickening. 2. No progressive adenopathy or distant metastases identified. 3. Stable bilateral pulmonary nodules. Short-term stability (for 4 months) is reassuring, although the nodules are too small to optimally characterize by previous PET-CT. Continued CT follow-up recommended.   12/01/2016 Surgery   Colon total resection and proctocolectomy for rectal cancer and Lynch syndrome    12/01/2016 Pathology Results   Proctocolectomy showed scattered small residual foci of invasive moderately differentiated adenocarcinoma embedded in submucosa tissue with extensive neoadjuvant effect, 2.5 cm in greatest time mention, adenocarcinoma invades through muscularis mucosa and involves subserosal soft tissue, margins are negative, 2 of 35 lymph nodes are positive for metastatic adenocarcinoma, 2 calcified and necrotic soft tissue nodules are also present without visible tumor seen.   01/07/2017 - 05/26/2017 Chemotherapy   Adjuvant chemo CAPOX, changed to FOLFOX from cycle 2 due to poor tolerance, a total of 4.5 months therapy     01/26/2017 Surgery   PLACEMENT OF PORT-A-CATH CENTRAL LINE WITH FLUOROSCOPY AND ULTRASOUND   06/21/2017 Imaging   CT Chest w contrast 06/21/2017 IMPRESSION: 1. No evidence of metastatic disease. 2.  Emphysema (ICD10-J43.9). 3. Scattered  subpleural pulmonary nodules measure 5 mm or less in size, stable and likely subpleural lymph nodes.   09/21/2017 Procedure   Pouchoscopy by Dr. Marcello Moores on 09/21/17 IMPRESSION  - The examined portion  of the ileum was normal. - No specimens collected.   09/22/2017 Surgery   TAKEDOWN OF LOOP ILEOSTOMY by Dr. Johney Maine on 09/22/17  Diagnosis 09/22/17  Ileostomy, loop SKIN AND COLONIC MUCOSA NEGATIVE FOR CARCINOMA   12/06/2017 Imaging   CT CAP W Contrast 12/06/17 IMPRESSION: 1. Postoperative changes of total proctocolectomy and ileoanal anastomosis without evidence of metastatic disease. Ileoanal anastomosis appears tethered to the sacrum by presacral thickening. 2.  Emphysema (ICD10-J43.9).   01/12/2018 Procedure   Pouchoscopy by Dr. Marcello Moores 01/12/18 - Ileoanal anastomotic stricture found on perianal exam. - No specimens collected. - Erythematous mucosa in the ileoanal pouch. Biopsied.   Pathology results  Diagnosis 01/12/18 Rectum, biopsy, J Pouch mucosa - SMALL BOWEL TYPE MUCOSA WITH CHRONIC INFLAMMATION - NO GRANULOMATA, DYSPLASIA, OR MALIGNANCY IDENTIFIED   06/25/2019 Imaging   CT CAP IMPRESSION: 1. Low anterior section and permanent right sided ileostomy, without evidence of recurrent or metastatic disease. Probable postoperative seroma in the presacral space, stable. 2. Mild hepatomegaly. 3. Cholelithiasis. 4.  Emphysema (ICD10-J43.9).   06/19/2020 Imaging   CT Chest  IMPRESSION: 1. Interval development of multiple small nodules in the lungs compared with 1 year ago. Given history of rectal cancer, metastatic disease is suspected. Most these nodules are very small and continued close follow-up is warranted. 2. Moderate emphysema   07/07/2020 Imaging   CT AP w contrast  IMPRESSION: 1. No evidence of metastatic disease in the abdomen or pelvis. Pulmonary nodules were better seen on dedicated CT chest 06/19/2020. 2. Hepatic steatosis. 3. Cholelithiasis. 4.  Emphysema (ICD10-J43.9).   09/08/2020 Imaging   CT chest  IMPRESSION: 1. Fluctuating bilateral pulmonary nodularity. Although several of the right lung nodule seen on the most recent study have improved/resolved,  there are at least 2 new nodules in the right lower and left upper lobes. The fluctuation suggests a possible inflammatory etiology, although metastatic disease cannot be excluded. Continued CT follow-up in 3-6 months recommended. 2. Stable small mediastinal lymph nodes, likely reactive. 3. Hepatic steatosis. 4. Right IJ Port-A-Cath tip is located inferior medially in the right atrium near the tricuspid valve, unchanged. 5. Emphysema (ICD10-J43.9).        CURRENT THERAPY:  Surveillance   INTERVAL HISTORY: *** [name] is here for a follow up of rectal cancer. He was last seen by me 4 months ago. He presents to the clinic alone.    REVIEW OF SYSTEMS:  *** Constitutional: Denies fevers, chills or abnormal weight loss Eyes: Denies blurriness of vision Ears, nose, mouth, throat, and face: Denies mucositis or sore throat Respiratory: Denies cough, dyspnea or wheezes Cardiovascular: Denies palpitation, chest discomfort or lower extremity swelling Gastrointestinal:  Denies nausea, heartburn or change in bowel habits Skin: Denies abnormal skin rashes Lymphatics: Denies new lymphadenopathy or easy bruising Neurological:Denies numbness, tingling or new weaknesses Behavioral/Psych: Mood is stable, no new changes  All other systems were reviewed with the patient and are negative.  MEDICAL HISTORY:  Past Medical History:  Diagnosis Date  . Anxiety   . C. difficile diarrhea 03/30/2018  . Colon cancer ([place]) 12/01/2016  . COPD (chronic obstructive pulmonary disease) ([place])   . Depression   . Family history of colon cancer   . 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 In families with MSH6 mutations, screening can start  at age 71 years since the onset of colon cancer is later in these families.  Annual screening for endome  . Poor dental hygiene   . Rectal adenocarcinoma s/p protctocolectomy, IPAA "J" pouch 12/01/2016 08/06/2016  . 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 COLON SURGERY    . EUS N/A 07/29/2016   Procedure: LOWER ENDOSCOPIC ULTRASOUND (EUS);  Surgeon: Milus Banister, MD;  Location: [facility];  Service: Endoscopy;  Laterality: N/A;  . ILEOSTOMY CLOSURE N/A 09/22/2017   Procedure: TAKEDOWN OF LOOP ILEOSTOMY;  Surgeon: Michael Boston, MD;  Location: [facility];  Service: General;  Laterality: N/A;  . LYSIS OF ADHESION N/A 05/10/2018   Procedure: LYSIS OF ADHESION;  Surgeon: Michael Boston, MD;  Location: [facility];  Service: General;  Laterality: N/A;  . PORTACATH PLACEMENT N/A 01/26/2017   Procedure: PLACEMENT OF PORT-A-CATH CENTRAL LINE WITH FLUOROSCOPY AND ULTRASOUND;  Surgeon: Michael Boston, MD;  Location: [facility];  Service: General;  Laterality: N/A;  . POUCHOSCOPY N/A 09/21/2017   Procedure: POUCHOSCOPY;  Surgeon: Leighton Ruff, MD;  Location: [facility];  Service: Endoscopy;  Laterality: N/A;  . POUCHOSCOPY N/A 01/12/2018   Procedure: POUCHOSCOPY WITH BIOPSIES;  Surgeon: Leighton Ruff, MD;  Location: [facility];  Service: Endoscopy;  Laterality: N/A;  Local  . PROCTOSCOPY N/A 12/01/2016   Procedure: RIGID PROCTOSCOPY;  Surgeon: Michael Boston, MD;  Location: [facility];  Service: General;  Laterality: N/A;  . RECTAL EXAM UNDER ANESTHESIA N/A 05/10/2018   Procedure: RECTAL EXAM UNDER ANESTHESIA;  Surgeon: Michael Boston, MD;  Location: [facility];  Service: General;  Laterality: N/A;  . TOE AMPUTATION Left   . XI ROBOTIC ASSISTED LOWER ANTERIOR RESECTION N/A 12/01/2016   Procedure: XI ROBOTIC ASSISTED PROCTOCOLECTOMY WITH ILLEOPOUCH  ANASTAMOSIS WITH DIVERTING ILLEOSTOMY;  Surgeon: Michael Boston, MD;  Location: [facility];  Service: General;  Laterality: N/A;  . XI ROBOTIC ASSISTED LOWER ANTERIOR RESECTION N/A 05/10/2018   Procedure: XI ROBOTIC RESECTION OF ILEAL J POUCH, LYSIS OF ADHESIONS, WITH CREATION OF PERMANENT END ILEOSTOMY.;  Surgeon: Michael Boston, MD;  Location: [facility];  Service: General;  Laterality: N/A;    I have reviewed the social history and family history with the patient and they are unchanged from previous note.  ALLERGIES:  has No Known Allergies.  MEDICATIONS:  Current Outpatient Medications  Medication Sig Dispense Refill  . Accu-Chek Softclix Lancets lancets Use as instructed 100 each 12  . albuterol (PROVENTIL) (2.5 MG/3ML) 0.083% nebulizer solution Take 3 mLs (2.5 mg total) by nebulization every 6 (six) hours as needed for wheezing or shortness of breath. 75 mL 0  . albuterol (VENTOLIN HFA) 108 (90 Base) MCG/ACT inhaler Inhale 2 puffs into the lungs every 4 (four) hours as needed for wheezing or shortness of breath. 18 g 2  . atorvastatin (LIPITOR) 20 MG tablet Take 1 tablet (20 mg total) by mouth daily. 90 tablet 3  . azithromycin (ZITHROMAX) 250 MG tablet Take 1 tablet (250 mg total) by mouth daily. 5 tablet 0  . budesonide-formoterol (SYMBICORT) 160-4.5 MCG/ACT inhaler Inhale 2 puffs into the  lungs in the morning and at bedtime. 1 each 12  . gabapentin (NEURONTIN) 300 MG capsule Take 2 capsules (600 mg total) by mouth 3 (three) times daily. 180 capsule 6  . glucose blood (ACCU-CHEK GUIDE) test strip Use as instructed 100 each 12  . metFORMIN (GLUCOPHAGE) 500 MG tablet Take 1 tablet (500 mg total) by mouth in the morning and at bedtime. 60 tablet 2  . sertraline (ZOLOFT) 100 MG tablet Take 1 tablet (100 mg total) by mouth daily. 90 tablet 3  . Tiotropium Bromide Monohydrate (SPIRIVA RESPIMAT) 2.5 MCG/ACT AERS INHALE 2 PUFFS BY MOUTH INTO THE LUNGS DAILY 4 g 1   No current facility-administered  medications for this visit.   Facility-Administered Medications Ordered in Other Visits  Medication Dose Route Frequency Provider Last Rate Last Admin  . sodium chloride flush (NS) 0.9 % injection 10 mL  10 mL Intravenous PRN Truitt Merle, MD   10 mL at 11/18/17 0953    PHYSICAL EXAMINATION: ECOG PERFORMANCE STATUS: {CHL ONC ECOG [phone]}  There were no vitals filed for this visit. There were no vitals filed for this visit. *** GENERAL:alert, no distress and comfortable SKIN: skin color, texture, turgor are normal, no rashes or significant lesions EYES: normal, Conjunctiva are pink and non-injected, sclera clear {OROPHARYNX:no exudate, no erythema and lips, buccal mucosa, and tongue normal}  NECK: supple, thyroid normal size, non-tender, without nodularity LYMPH:  no palpable lymphadenopathy in the cervical, axillary {or inguinal} LUNGS: clear to auscultation and percussion with normal breathing effort HEART: regular rate & rhythm and no murmurs and no lower extremity edema ABDOMEN:abdomen soft, non-tender and normal bowel sounds Musculoskeletal:no cyanosis of digits and no clubbing  NEURO: alert & oriented x 3 with fluent speech, no focal motor/sensory deficits  LABORATORY DATA:  I have reviewed the data as listed CBC Latest Ref Rng & Units 09/18/2020 06/26/2020 06/04/2020  WBC 4.0 - 10.5 K/uL 9.6 7.0 9.5  Hemoglobin 13.0 - 17.0 g/dL 15.5 16.7 16.6  Hematocrit 39.0 - 52.0 % 46.0 50.0 49.9  Platelets 150 - 400 K/uL 179 223 210     CMP Latest Ref Rng & Units 10/24/2020 09/18/2020 06/26/2020  Glucose 65 - 99 mg/dL 240(H) 210(H) 139(H)  BUN 6 - 24 mg/dL _0 Creatinine 0.76 - 1.27 mg/dL 0.95 0.92 0.85  Sodium 134 - 144 mmol/L 139 138 139  Potassium 3.5 - 5.2 mmol/L 4.8 3.5 4.2  Chloride 96 - 106 mmol/L 106 104 108  CO2 20 - 29 mmol/L 18(L) 25 24  Calcium 8.7 - 10.2 mg/dL 9.1 9.1 8.8(L)  Total Protein 6.5 - 8.1 g/dL - 5.8(L) 5.6(L)  Total Bilirubin 0.3 - 1.2 mg/dL - 0.3 0.2(L)   Alkaline Phos 38 - 126 U/L - 62 55  AST 15 - 41 U/L - 30 30  ALT 0 - 44 U/L - 42 40      RADIOGRAPHIC STUDIES: I have personally reviewed the radiological images as listed and agreed with the findings in the report. No results found.   ASSESSMENT & PLAN:  [name] is a 50 y.o. male with    1. 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 He was treated with neoadjuvant concurrent chemoRT, colon surgery, adjuvant chemo and ileostomy reversal.Now on surveillance.  -He hadileostomyplacedagaindue to uncontrolleddiarrhea. It's manageable now.  -His 7/8/21GuardantReveal Testingwas negative for circulatingtumor cells. ***   2.Multiple lung nodules, dyspnea on exertion, Cough,  Emphysema -He has a history of smoking and is currentlystill smokes 1ppd. He has known cough which he attributed to smoking. He previously declined nicotine patched, but still working on quitting. -He had progressive SOB and cough in early 2021. CT chest on 06/19/20 showed interval development of multiple small b/l pulmonary nodules compared to prior year. There is concern for metastatic disease. Scan also shows moderate emphysema. -CT chest from 09/08/20 shows overall stable nodules in bilateral lungs, some resolved, a few new nodules bone metastasis less likely. His pulmonary symptoms has improved with COPD treatment and temporary quitting smoking. Repeat scan in 12/2020 -F/u with [facility] pulmonary Dr. Loanne Drilling   3. 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  4. Anxiety, Depression,Social issues -He notes smoking helps anxiety him but understands he should quit. -He is out of work,ondisability  Plan  *** -CT scan reviewed, no high concern for lung metastasis -Lab and follow-up in 2 months.  Plan to repeat a CT chest in 4 months -flu shot today    No problem-specific Assessment & Plan notes found for this encounter.   No orders of the defined types were placed in this encounter.  All questions were answered. The patient knows to call the clinic with any problems, questions or concerns. No barriers to learning was detected. The total time spent in the appointment was {CHL ONC TIME VISIT - ZYYQM:[id number]}.     Joslyn Devon 12/22/2020   Oneal Deputy, am acting as scribe for Truitt Merle, MD.   {Add scribe attestation statement}

## 2020-12-25 ENCOUNTER — Ambulatory Visit: Payer: Medicaid Other | Admitting: Hematology

## 2020-12-25 ENCOUNTER — Other Ambulatory Visit: Payer: Medicaid Other

## 2020-12-25 DIAGNOSIS — C20 Malignant neoplasm of rectum: Secondary | ICD-10-CM

## 2021-01-12 ENCOUNTER — Encounter: Payer: Self-pay | Admitting: Family Medicine

## 2021-01-14 ENCOUNTER — Encounter: Payer: Self-pay | Admitting: Family Medicine

## 2021-01-14 ENCOUNTER — Ambulatory Visit: Payer: Medicaid Other

## 2021-01-22 ENCOUNTER — Other Ambulatory Visit: Payer: Self-pay | Admitting: Family Medicine

## 2021-02-04 ENCOUNTER — Ambulatory Visit: Payer: Medicaid Other | Admitting: Family Medicine

## 2021-02-04 NOTE — Progress Notes (Deleted)
    SUBJECTIVE:   CHIEF COMPLAINT / HPI:   T2DM Current regimen: Metformin 500 mg daily due to worsening of diarrhea CBGs*** Last A1c 8.2 on 10/7, day of diagnosis Currently on statin, started on 11/5, LDL 79 at that time Eye exam ***  Emphysema and tobacco abuse ***  PERTINENT  PMH / PSH: ***  OBJECTIVE:   There were no vitals taken for this visit.   Physical Exam: *** General: 50 y.o. male in NAD Cardio: RRR no m/r/g Lungs: CTAB, no wheezing, no rhonchi, no crackles, no IWOB on *** Abdomen: Soft, non-tender to palpation, non-distended, positive bowel sounds Skin: warm and dry Extremities: No edema   ASSESSMENT/PLAN:   No problem-specific Assessment & Plan notes found for this encounter.     [name], [place]

## 2021-02-23 DIAGNOSIS — H5213 Myopia, bilateral: Secondary | ICD-10-CM | POA: Diagnosis not present

## 2021-03-02 DIAGNOSIS — Z432 Encounter for attention to ileostomy: Secondary | ICD-10-CM | POA: Diagnosis not present

## 2021-03-02 DIAGNOSIS — Z932 Ileostomy status: Secondary | ICD-10-CM | POA: Diagnosis not present

## 2021-03-27 ENCOUNTER — Other Ambulatory Visit: Payer: Self-pay | Admitting: Family Medicine

## 2021-04-03 DIAGNOSIS — Z432 Encounter for attention to ileostomy: Secondary | ICD-10-CM | POA: Diagnosis not present

## 2021-04-03 DIAGNOSIS — Z932 Ileostomy status: Secondary | ICD-10-CM | POA: Diagnosis not present

## 2021-04-29 ENCOUNTER — Encounter: Payer: Self-pay | Admitting: Family Medicine

## 2021-05-03 ENCOUNTER — Other Ambulatory Visit: Payer: Self-pay | Admitting: Family Medicine

## 2021-05-03 DIAGNOSIS — E1165 Type 2 diabetes mellitus with hyperglycemia: Secondary | ICD-10-CM

## 2021-05-26 DIAGNOSIS — Z932 Ileostomy status: Secondary | ICD-10-CM | POA: Diagnosis not present

## 2021-05-26 DIAGNOSIS — Z432 Encounter for attention to ileostomy: Secondary | ICD-10-CM | POA: Diagnosis not present

## 2021-06-08 ENCOUNTER — Other Ambulatory Visit: Payer: Self-pay | Admitting: Family Medicine

## 2021-06-25 DIAGNOSIS — Z432 Encounter for attention to ileostomy: Secondary | ICD-10-CM | POA: Diagnosis not present

## 2021-06-25 DIAGNOSIS — Z932 Ileostomy status: Secondary | ICD-10-CM | POA: Diagnosis not present

## 2021-06-30 ENCOUNTER — Telehealth: Payer: Self-pay

## 2021-06-30 ENCOUNTER — Encounter: Payer: Self-pay | Admitting: Family Medicine

## 2021-06-30 NOTE — Telephone Encounter (Signed)
[name] (significant other) calls nurse line requesting an apt for patient. [name] reports the patient has been bleeding from his rectum for a few days now. Patient does have colostomy bag, however they have not noticed any blood in bag. [name] denies any new trauma to the rectum or signs of infection. Advised patient to reach out to GI provider, however she stated they have not been seen in so long they need to be seen here first. I scheduled patient fo first available on 7/15. Strict ED precaution given to [facility]. Will forward to PCP for any additional information.

## 2021-07-01 ENCOUNTER — Emergency Department (HOSPITAL_COMMUNITY): Payer: Medicaid Other

## 2021-07-01 ENCOUNTER — Encounter: Payer: Self-pay | Admitting: Hematology

## 2021-07-01 ENCOUNTER — Other Ambulatory Visit: Payer: Self-pay

## 2021-07-01 ENCOUNTER — Encounter (HOSPITAL_COMMUNITY): Payer: Self-pay

## 2021-07-01 ENCOUNTER — Ambulatory Visit (INDEPENDENT_AMBULATORY_CARE_PROVIDER_SITE_OTHER): Payer: Medicaid Other | Admitting: Family Medicine

## 2021-07-01 ENCOUNTER — Emergency Department (HOSPITAL_COMMUNITY)
Admission: EM | Admit: 2021-07-01 | Discharge: 2021-07-01 | Disposition: A | Discharge status: Home / Self Care | Payer: Medicaid Other | Attending: Emergency Medicine | Admitting: Emergency Medicine

## 2021-07-01 ENCOUNTER — Encounter: Payer: Self-pay | Admitting: Family Medicine

## 2021-07-01 VITALS — BP 98/62 | HR 76 | Ht 71.0 in | Wt 174.4 lb

## 2021-07-01 DIAGNOSIS — E785 Hyperlipidemia, unspecified: Secondary | ICD-10-CM | POA: Insufficient documentation

## 2021-07-01 DIAGNOSIS — E1169 Type 2 diabetes mellitus with other specified complication: Secondary | ICD-10-CM | POA: Diagnosis not present

## 2021-07-01 DIAGNOSIS — C20 Malignant neoplasm of rectum: Secondary | ICD-10-CM | POA: Diagnosis not present

## 2021-07-01 DIAGNOSIS — E1165 Type 2 diabetes mellitus with hyperglycemia: Secondary | ICD-10-CM | POA: Diagnosis not present

## 2021-07-01 DIAGNOSIS — K625 Hemorrhage of anus and rectum: Secondary | ICD-10-CM | POA: Diagnosis not present

## 2021-07-01 DIAGNOSIS — R1011 Right upper quadrant pain: Secondary | ICD-10-CM

## 2021-07-01 DIAGNOSIS — J449 Chronic obstructive pulmonary disease, unspecified: Secondary | ICD-10-CM | POA: Insufficient documentation

## 2021-07-01 DIAGNOSIS — Z7984 Long term (current) use of oral hypoglycemic drugs: Secondary | ICD-10-CM | POA: Diagnosis not present

## 2021-07-01 DIAGNOSIS — K922 Gastrointestinal hemorrhage, unspecified: Secondary | ICD-10-CM

## 2021-07-01 DIAGNOSIS — F419 Anxiety disorder, unspecified: Secondary | ICD-10-CM

## 2021-07-01 DIAGNOSIS — F17210 Nicotine dependence, cigarettes, uncomplicated: Secondary | ICD-10-CM | POA: Insufficient documentation

## 2021-07-01 DIAGNOSIS — Z7951 Long term (current) use of inhaled steroids: Secondary | ICD-10-CM | POA: Insufficient documentation

## 2021-07-01 DIAGNOSIS — R109 Unspecified abdominal pain: Secondary | ICD-10-CM | POA: Diagnosis not present

## 2021-07-01 DIAGNOSIS — Z85038 Personal history of other malignant neoplasm of large intestine: Secondary | ICD-10-CM | POA: Diagnosis not present

## 2021-07-01 DIAGNOSIS — Z79899 Other long term (current) drug therapy: Secondary | ICD-10-CM | POA: Diagnosis not present

## 2021-07-01 DIAGNOSIS — K8020 Calculus of gallbladder without cholecystitis without obstruction: Secondary | ICD-10-CM | POA: Diagnosis not present

## 2021-07-01 DIAGNOSIS — E1140 Type 2 diabetes mellitus with diabetic neuropathy, unspecified: Secondary | ICD-10-CM | POA: Insufficient documentation

## 2021-07-01 LAB — POCT GLYCOSYLATED HEMOGLOBIN (HGB A1C): HbA1c, POC (controlled diabetic range): 8.7 % — AB (ref 0.0–7.0)

## 2021-07-01 LAB — CBC WITH DIFFERENTIAL/PLATELET
Abs Immature Granulocytes: 0.1 10*3/uL — ABNORMAL HIGH (ref 0.00–0.07)
Band Neutrophils: 3 %
Basophils Absolute: 0.1 10*3/uL (ref 0.0–0.1)
Basophils Relative: 1 %
Eosinophils Absolute: 0.2 10*3/uL (ref 0.0–0.5)
Eosinophils Relative: 2 %
HCT: 49 % (ref 39.0–52.0)
Hemoglobin: 16.5 g/dL (ref 13.0–17.0)
Lymphocytes Relative: 11 %
Lymphs Abs: 0.8 10*3/uL (ref 0.7–4.0)
MCH: 29.7 pg (ref 26.0–34.0)
MCHC: 33.7 g/dL (ref 30.0–36.0)
MCV: 88.1 fL (ref 80.0–100.0)
Monocytes Absolute: 0.2 10*3/uL (ref 0.1–1.0)
Monocytes Relative: 3 %
Myelocytes: 1 %
Neutro Abs: 6.3 10*3/uL (ref 1.7–7.7)
Neutrophils Relative %: 79 %
Platelets: 218 10*3/uL (ref 150–400)
RBC: 5.56 MIL/uL (ref 4.22–5.81)
RDW: 13.8 % (ref 11.5–15.5)
WBC: 7.7 10*3/uL (ref 4.0–10.5)
nRBC: 0 % (ref 0.0–0.2)

## 2021-07-01 LAB — POCT HEMOGLOBIN: Hemoglobin: 16.1 g/dL — AB (ref 11–14.6)

## 2021-07-01 LAB — COMPREHENSIVE METABOLIC PANEL
ALT: 30 U/L (ref 0–44)
AST: 25 U/L (ref 15–41)
Albumin: 3.1 g/dL — ABNORMAL LOW (ref 3.5–5.0)
Alkaline Phosphatase: 65 U/L (ref 38–126)
Anion gap: 7 (ref 5–15)
BUN: 12 mg/dL (ref 6–20)
CO2: 25 mmol/L (ref 22–32)
Calcium: 8.9 mg/dL (ref 8.9–10.3)
Chloride: 106 mmol/L (ref 98–111)
Creatinine, Ser: 0.87 mg/dL (ref 0.61–1.24)
GFR, Estimated: >60 mL/min (ref >60)
Glucose, Bld: 212 mg/dL — ABNORMAL HIGH (ref 70–99)
Potassium: 4.1 mmol/L (ref 3.5–5.1)
Sodium: 138 mmol/L (ref 135–145)
Total Bilirubin: 0.6 mg/dL (ref 0.3–1.2)
Total Protein: 6.3 g/dL — ABNORMAL LOW (ref 6.5–8.1)

## 2021-07-01 LAB — TYPE AND SCREEN
ABO/RH(D): A POS
Antibody Screen: NEGATIVE

## 2021-07-01 LAB — LIPASE, BLOOD: Lipase: 42 U/L (ref 11–51)

## 2021-07-01 LAB — HEMOCCULT GUIAC POC 1CARD (OFFICE): Fecal Occult Blood, POC: POSITIVE — AB

## 2021-07-01 MED ORDER — HYDROXYZINE HCL 10 MG PO TABS: 5.0000 mg | ORAL_TABLET | Freq: Every evening | ORAL | 0 refills | Status: DC | PRN

## 2021-07-01 MED ORDER — OXYCODONE HCL 5 MG PO TABS: 5.0000 mg | ORAL_TABLET | Freq: Four times a day (QID) | ORAL | 0 refills | Status: DC | PRN

## 2021-07-01 MED ORDER — SODIUM CHLORIDE (PF) 0.9 % IJ SOLN
INTRAMUSCULAR | Status: AC
  Filled 2021-07-01: qty 50

## 2021-07-01 MED ORDER — HYDROMORPHONE HCL 1 MG/ML IJ SOLN
1.0000 mg | Freq: Once | INTRAMUSCULAR | Status: AC | PRN
  Administered 2021-07-01: 1 mg via INTRAVENOUS
  Filled 2021-07-01: qty 1

## 2021-07-01 MED ORDER — MORPHINE SULFATE (PF) 4 MG/ML IV SOLN
4.0000 mg | Freq: Once | INTRAVENOUS | Status: AC
Start: 2021-07-01 — End: 2021-07-01
  Administered 2021-07-01: 4 mg via INTRAVENOUS
  Filled 2021-07-01: qty 1

## 2021-07-01 MED ORDER — IOHEXOL 350 MG/ML SOLN
80.0000 mL | Freq: Once | INTRAVENOUS | Status: AC | PRN
  Administered 2021-07-01: 80 mL via INTRAVENOUS

## 2021-07-01 NOTE — Progress Notes (Signed)
    SUBJECTIVE:   CHIEF COMPLAINT / HPI: bleeding from rectum and lump on right side  Lump Reports found lump on right anterior ribs Monday.  Painful to touch. Feels like on top of ribs.  Denies fevers, dizziness, weakness, chest pain, shortness of breath.  No nausea, vomiting or weight loss.  Appetite unchanged.  Rectal bleeding Reports Tues had some blood clots from rectum.  Initially was clear fluid, then bloody discharge that was clotted when wiping, now clear.  Reports rectum was surgically anastomosed when had colostomy s/p colorectal cancer.  PERTINENT  PMH / PSH:  Rectal adenocarcinoma s/p proctocolectomy OBJECTIVE:   BP 98/62   Pulse 76   Ht 5\' 11"  (1.803 m)   Wt 174 lb 6.4 oz (79.1 kg)   SpO2 97%   BMI 24.32 kg/m    General: Alert, no acute distress Cardio: Normal S1 and S2, RRR, no r/m/g Pulm: CTAB, normal work of breathing Abdomen: Bowel sounds normal. Abdomen soft and diffusely tender.  No peritoneal signs. No blood noted on DRE Extremities: No peripheral edema.    ASSESSMENT/PLAN:   Rectal bleeding Report rectal bleeding/clots and feeling like needing to have BM.  Exam benign for blood on gloved finger.  Unclear etiology of rectal bleeding given history of colorectal cancer now with colostomy.  Doubt acute GI bleed given normal vital signs and stable Hbg.  Possible malignant recurrance.   -FOBT from colostomy positive -Hbg 16.1, doubt GI bleed -CMP, CBC -CT abdomen w/wo contrast -Refer GI -Refer Oncology -Discussed with patient and wife that will need to get prior approval for CT and next available appointment with GI was next month. Vital signs and Hbg stable so do not think needs admission.  Advised that if showing any signs of acute bleeding to go to ED for evaluation.  Anxiety Requested Xanax for anxiety.  Given recent stressors will order Atrax 5 mg q hs x 15 tabs Follow up with PCP to discuss Anxiety management  Type 2 diabetes mellitus with  hyperglycemia, without long-term current use of insulin (HCC) A1c 8.7. Patient aware of results. Follow up with PCP     Carollee Leitz, MD [place]

## 2021-07-01 NOTE — Discharge Instructions (Signed)
Your CT scan overall was reassuring today.  We did not see signs of gallstones.  Although your gallbladder does not appear inflamed, gallstones can still cause pain.  I recommended that you follow-up with a general surgeon in the office for an evaluation.    Try to avoid eating fatty foods and pay attention to list of foods provided.

## 2021-07-01 NOTE — Patient Instructions (Signed)
Thank you for coming to see me today. It was a pleasure.   We will get some labs today.  If they are abnormal or we need to do something about them, I will call you.  If they are normal, I will send you a message on MyChart (if it is active) or a letter in the mail.  If you don't hear from [place] in 2 weeks, please call the office at the number below.   Will send referral to GI and Oncology.  If you do not hear anything in the next week please call and let us know  I have ordered imaging of your belly.  They will call to schedule an appointment.  Please follow-up with PCP next week  If you have any questions or concerns, please do not hesitate to call the office at [phone].  Best,   Carollee Leitz, MD

## 2021-07-01 NOTE — ED Provider Notes (Signed)
Emergency Medicine Provider Triage Evaluation Note  [name] , a 50 y.o. male  was evaluated in triage.  Pt complains of bright red blood per rectum for the past 2 days with new onset RUQ abdominal pain. Pt reports hx of rectal cancer s/p colostomy, radiation/chemo several years ago. He has been doing well until he began having pain a couple of days ago and then noticed bright red blood from his rectum. Pt reports he still has colostomy in place and has not noticed any blood from this area. He went to his PCP today who was concerned about his "liver" and sent him here for further eval. His stool did test positive for blood in the office. He is not anticoagulated.  Review of Systems  Positive: + abdominal pain, bright red blood per rectum Negative: - nausea, vomiting  Physical Exam  BP (!) 127/94   Pulse 77   Temp 98.2 F (36.8 C) (Oral)   Resp 18   SpO2 99%  Gen:   Awake, no distress   Resp:  Normal effort  MSK:   Moves extremities without difficulty  Other:  + RUQ pain  Medical Decision Making  Medically screening exam initiated at 6:48 PM.  Appropriate orders placed.  [name] was informed that the remainder of the evaluation will be completed by another provider, this initial triage assessment does not replace that evaluation, and the importance of remaining in the ED until their evaluation is complete.     Tyrone Maize, PA-C 07/01/21 1853    Arnaldo Natal, MD 07/01/21 2336

## 2021-07-01 NOTE — ED Provider Notes (Signed)
[facility] DEPT Provider Note   CSN: [id number] Arrival date & time: 07/01/21  1832     History Chief Complaint  Patient presents with   Abnormal Labs    [name] is a 50 y.o. 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  He reports this began 2 days ago on Monday.  He is having sharp pain in the right upper quadrant.  It comes and goes.  He is noted some bloody output in his ostomy.  He also reported a small amount of bleeding near his anus, although he states this was "sown shut" years ago and he produces no stool through there.  He is here with his girlfriend.  They reports that they called or spoke to his PCP today and advised to come to the ER for "abnormal labs".  It is not clear which his labs were.  He is not on blood thinners.  HPI     Past Medical History:  Diagnosis Date   Anxiety    C. 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 In families with MSH6 mutations, screening can start at age 6 years since the onset of colon cancer is later in these families.  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    Patient Active Problem List   Diagnosis Date Noted   Paresthesia of left  arm 11/25/2020   Hyperlipidemia associated with type 2 diabetes mellitus ([place]) 10/24/2020   Type 2 diabetes mellitus with hyperglycemia, without long-term current use of insulin ([place]) 09/25/2020   Fatigue 09/25/2020   Centrilobular emphysema ([place]) 08/26/2020   COPD with acute exacerbation ([place]) 08/26/2020   Pulmonary nodules/lesions, multiple 07/10/2020   Encounter to discuss test results 06/25/2020   Difficulty breathing 07/31/2019   Cervical radiculopathy 09/15/2018   Need for immunization against influenza 09/15/2018   Dark urine 07/25/2018   Intraabdominal abscess 07/25/2018   Neuropathic pain of foot 05/28/2018   Overflow incontinence s/p pouchectomy & ileostomy 04/2018 05/11/2018   Ileostomy in place  05/10/2018   Current mild episode of major depressive disorder without prior episode ([place]) 04/24/2018   Diarrhea 04/24/2018   Situational syncope 03/30/2018   Tobacco abuse 03/29/2018   Muscle spasm 04/11/2017   Chronic diarrhea s/p proctocolectomy & ileoanal J pouch 02/17/2017   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 10/13/2016   Family history of colon cancer    Anxiety 09/14/2016   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 08/06/2016    Past Surgical History:  Procedure Laterality Date   COLON SURGERY     EUS N/A 07/29/2016   Procedure: LOWER ENDOSCOPIC ULTRASOUND (EUS);  Surgeon: Milus Banister, MD;  Location: [facility];  Service: Endoscopy;  Laterality: N/A;   ILEOSTOMY CLOSURE N/A 09/22/2017   Procedure:  TAKEDOWN OF LOOP ILEOSTOMY;  Surgeon: Michael Boston, MD;  Location: [facility];  Service: General;  Laterality: N/A;   LYSIS OF ADHESION N/A 05/10/2018   Procedure: LYSIS OF ADHESION;  Surgeon: Michael Boston, MD;  Location: [facility];  Service: General;  Laterality: N/A;   PORTACATH PLACEMENT N/A 01/26/2017   Procedure: PLACEMENT OF PORT-A-CATH CENTRAL LINE WITH FLUOROSCOPY AND ULTRASOUND;  Surgeon: Michael Boston, MD;  Location: [facility];  Service: General;  Laterality: N/A;   POUCHOSCOPY N/A 09/21/2017   Procedure: POUCHOSCOPY;  Surgeon: Leighton Ruff, MD;  Location: [facility];  Service: Endoscopy;  Laterality: N/A;   POUCHOSCOPY N/A 01/12/2018   Procedure: POUCHOSCOPY WITH BIOPSIES;  Surgeon: Leighton Ruff, MD;  Location: [facility];  Service: Endoscopy;  Laterality: N/A;  Local   PROCTOSCOPY N/A 12/01/2016   Procedure: RIGID PROCTOSCOPY;  Surgeon: Michael Boston, MD;  Location: [facility];  Service: General;  Laterality: N/A;   RECTAL EXAM UNDER ANESTHESIA N/A 05/10/2018   Procedure: RECTAL EXAM UNDER ANESTHESIA;  Surgeon: Michael Boston, MD;  Location: [facility];  Service: General;  Laterality: N/A;   TOE AMPUTATION Left    XI ROBOTIC ASSISTED LOWER ANTERIOR RESECTION N/A 12/01/2016   Procedure: XI ROBOTIC ASSISTED PROCTOCOLECTOMY WITH ILLEOPOUCH ANASTAMOSIS WITH DIVERTING ILLEOSTOMY;  Surgeon: Michael Boston, MD;  Location: [facility];  Service: General;  Laterality: N/A;   XI ROBOTIC ASSISTED LOWER ANTERIOR RESECTION N/A 05/10/2018   Procedure: XI ROBOTIC RESECTION OF ILEAL J POUCH, LYSIS OF ADHESIONS, WITH CREATION OF PERMANENT END ILEOSTOMY.;  Surgeon: Michael Boston, MD;  Location: [facility];  Service: General;  Laterality: N/A;       Family History  Problem Relation Age of Onset   Diabetes Mother    COPD Father    Colon cancer Maternal Aunt        dx in her 107s   Diabetes Maternal Grandmother    Brain cancer Maternal Grandfather    Lung cancer Paternal Grandfather    Colon cancer Cousin        dx in her 69s   Bone cancer Sister 8   Pancreatic cancer Neg Hx    Esophageal cancer Neg Hx    Stomach cancer Neg Hx    Liver disease Neg Hx     Social History   Tobacco Use   Smoking status: Every Day    Packs/day: 1.00    Years: 33.00    Pack years: 33.00    Types: Cigarettes    Start date: 12/20/1985   Smokeless tobacco: Never   Tobacco comments:    smokes 1 pack/day- 08/14/2020  Vaping Use   Vaping Use: Never  used  Substance Use Topics   Alcohol use: No   Drug use: No    Comment: patient denies    Home Medications Prior to Admission medications   Medication Sig Start Date End Date Taking? Authorizing Provider  oxyCODONE (ROXICODONE) 5 MG immediate release tablet Take 1 tablet (5 mg total) by mouth every 6 (six) hours as needed for up to 15 doses for severe pain. 07/01/21  Yes Wyvonnia Dusky, MD  Accu-Chek Softclix Lancets lancets Use as instructed 09/25/20   Meccariello, Bernita Raisin, DO  albuterol (PROVENTIL) (2.5 MG/3ML) 0.083% nebulizer solution Take 3 mLs (2.5 mg total) by nebulization every 6 (six) hours as needed for wheezing or shortness of breath. 06/04/20   Lockamy, Christia Reading, DO  albuterol (VENTOLIN HFA) 108 (90 Base) MCG/ACT inhaler Inhale 2 puffs into the lungs every 4 (  four) hours as needed for wheezing or shortness of breath. 11/24/20   Meccariello, Bernita Raisin, DO  atorvastatin (LIPITOR) 20 MG tablet Take 1 tablet (20 mg total) by mouth daily. 10/24/20   Meccariello, Bernita Raisin, DO  azithromycin (ZITHROMAX) 250 MG tablet Take 1 tablet (250 mg total) by mouth daily. 08/14/20   Margaretha Seeds, MD  budesonide-formoterol [facility]) 160-4.5 MCG/ACT inhaler Inhale 2 puffs into the lungs in the morning and at bedtime. 08/14/20   Margaretha Seeds, MD  gabapentin (NEURONTIN) 300 MG capsule TAKE 2 CAPSULES (600 MG TOTAL) BY MOUTH 3 (THREE) TIMES DAILY. 06/09/21   Meccariello, Bernita Raisin, DO  glucose blood (ACCU-CHEK GUIDE) test strip Use as instructed 09/25/20   Meccariello, Bernita Raisin, DO  hydrOXYzine (ATARAX/VISTARIL) 10 MG tablet Take 0.5 tablets (5 mg total) by mouth at bedtime as needed. 07/01/21   Carollee Leitz, MD  metFORMIN (GLUCOPHAGE) 500 MG tablet TAKE 1 TABLET BY MOUTH EVERY MORNING AND AT BEDTIME 05/05/21   Meccariello, Bernita Raisin, DO  sertraline (ZOLOFT) 100 MG tablet TAKE 1 TABLET BY MOUTH EVERY DAY 06/09/21   Meccariello, Bernita Raisin, DO  SPIRIVA RESPIMAT 2.5 MCG/ACT AERS INHALE 2 PUFFS BY MOUTH INTO  THE LUNGS DAILY 03/27/21   Patriciaann Clan, DO    Allergies    Patient has no known allergies.  Review of Systems   Review of Systems  Constitutional:  Negative for chills and fever.  HENT:  Negative for ear pain and sore throat.   Eyes:  Negative for pain and visual disturbance.  Respiratory:  Negative for cough and shortness of breath.   Cardiovascular:  Negative for chest pain and palpitations.  Gastrointestinal:  Positive for abdominal pain, blood in stool and vomiting.  Musculoskeletal:  Negative for arthralgias and back pain.  Skin:  Negative for color change and rash.  Neurological:  Negative for syncope and light-headedness.  All other systems reviewed and are negative.  Physical Exam Updated Vital Signs BP 113/89   Pulse 71   Temp 98.4 F (36.9 C)   Resp 14   SpO2 95%   Physical Exam Constitutional:      General: He is not in acute distress. HENT:     Head: Normocephalic and atraumatic.  Eyes:     Conjunctiva/sclera: Conjunctivae normal.     Pupils: Pupils are equal, round, and reactive to light.  Cardiovascular:     Rate and Rhythm: Normal rate and regular rhythm.     Pulses: Normal pulses.  Pulmonary:     Effort: Pulmonary effort is normal. No respiratory distress.  Abdominal:     General: There is no distension.     Tenderness: There is abdominal tenderness in the right upper quadrant. There is no guarding or rebound.     Comments: Ostomy site with regular stool output, stoma appears normal  Genitourinary:    Comments: Rectal exam with no visible external bleeding, no internal digital exam performed Skin:    General: Skin is warm and dry.  Neurological:     General: No focal deficit present.     Mental Status: He is alert and oriented to person, place, and time. Mental status is at baseline.  Psychiatric:        Mood and Affect: Mood normal.        Behavior: Behavior normal.    ED Results / Procedures / Treatments   Labs (all labs ordered are  listed, but only abnormal results are displayed) Labs Reviewed  COMPREHENSIVE  METABOLIC PANEL - Abnormal; Notable for the following components:      Result Value   Glucose, Bld 212 (*)    Total Protein 6.3 (*)    Albumin 3.1 (*)    All other components within normal limits  CBC WITH DIFFERENTIAL/PLATELET - Abnormal; Notable for the following components:   Abs Immature Granulocytes 0.10 (*)    All other components within normal limits  LIPASE, BLOOD  POC OCCULT BLOOD, ED  TYPE AND SCREEN    EKG None  Radiology CT ABDOMEN PELVIS W CONTRAST  Result Date: 07/01/2021 CLINICAL DATA:  Right upper quadrant abdominal pain bloody from ostomy. EXAM: CT ABDOMEN AND PELVIS WITH CONTRAST TECHNIQUE: Multidetector CT imaging of the abdomen and pelvis was performed using the standard protocol following bolus administration of intravenous contrast. CONTRAST:  12m OMNIPAQUE IOHEXOL 350 MG/ML SOLN COMPARISON:  CT September 08 2020 and June 27, 2020. FINDINGS: Lower chest: Emphysematous changes in lung bases. Calcified granulomas. No acute abnormality. Hepatobiliary: No suspicious hepatic lesion. Numerous small cholelithiasis measuring up to 5 mm without findings of acute cholecystitis. No biliary ductal dilation. Pancreas: Within normal limits. Spleen: Normal in size without focal abnormality. Adrenals/Urinary Tract: Bilateral adrenal glands are unremarkable. No hydronephrosis. Subcentimeter hypodense right renal lesion, technically too small to accurately characterize but statistically likely represent a cyst, stable from prior. Urinary bladder is decompressed limiting evaluation. Stomach/Bowel: Stomach is grossly unremarkable. No pathologic dilation of small bowel. Postsurgical change of low anterior resection and right lower quadrant ileostomy. Slightly decreased size the organized gas and fluid collection presacral surgical bed now measuring 2.3 x 1.6 cm previously 2.7 x 2.0 cm on image 71/2. Overall stable  appearance the associated presacral soft tissue thickening. Vascular/Lymphatic: No abdominal aortic aneurysm. Stable prominent upper abdominal, retroperitoneal and mesenteric lymph nodes, with the indexed periportal lymph node measures 12 mm on axial image 25/2. Reproductive: No acute finding Other: No abdominopelvic ascites. Stable mild mesenteric haziness and nodularity. 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 No aggressive lytic or blastic lesions bone. IMPRESSION: 1. No acute findings in the abdomen or pelvis. 2. Stable examination with postsurgical change of low anterior resection and right lower quadrant ileostomy. Without evidence new or progressive the abdomen or pelvis. 3. Cholelithiasis without findings of acute cholecystitis. 4.  Emphysema (ICD10-J43.9). Electronically Signed   By: JDahlia BailiffMD   On: 07/01/2021 20:37   [place]Abdomen Limited RUQ (LIVER/GB)  Result Date: 07/01/2021 CLINICAL DATA:  Abdominal pain. EXAM: ULTRASOUND ABDOMEN LIMITED RIGHT UPPER QUADRANT COMPARISON:  CT scan, same date. FINDINGS: Gallbladder: Numerous shadowing gallstones are noted the gallbladder. The largest measures 1.7 cm. No gallbladder wall thickening, pericholecystic fluid or sonographic Murphy sign to suggest acute cholecystitis. Common bile duct: Diameter: 3.0 mm Liver: Normal echogenicity without focal lesion or biliary dilatation. Portal vein is patent on color Doppler imaging with normal direction of blood flow towards the liver. Other: None. IMPRESSION: 1. Cholelithiasis but no sonographic findings to suggest acute cholecystitis. 2. Normal sonographic appearance of the liver and no intra or extrahepatic biliary dilatation. Electronically Signed   By: PMarijo SanesM.D.   On: 07/01/2021 22:12    Procedures Procedures   Medications Ordered in ED Medications  sodium chloride (PF) 0.9 % injection (has no administration in time range)  iohexol  (OMNIPAQUE) 350 MG/ML injection 80 mL (80 mLs Intravenous Contrast Given 07/01/21 2009)  morphine 4 MG/ML injection 4 mg (4 mg Intravenous Given 07/01/21 2037)  HYDROmorphone (DILAUDID) injection 1 mg (1 mg Intravenous Given 07/01/21 2135)    ED Course  I have reviewed the triage vital signs and the nursing notes.  Pertinent labs & imaging results that were available during my care of the patient were reviewed by me and considered in my medical decision making (see chart for details).  Differential diagnosis for abdominal pain includes colitis for his gastritis or his biliary disease versus other.  Patient's labs reviewed and largely unremarkable.  LFTs are normal.  Normal white blood cell count.  Hemoglobin is also normal.  Lipase is normal.  Doubt significant GI bleed.  CT scan reviewed with no acute or significant findings, aside from gallstones noted.  Radiographs and ultrasound also reviewed with no obvious signs of acute cholecystitis, but again he has gallstones.  I suspect he may be suffering from biliary colic.  His pain is well controlled in the ED.   Clinical Course as of 07/01/21 2322  Wed Jul 01, 2021  2105 CT scan shows gallstones but no other significant findings suggest a source of his right upper quadrant pain.  Given the location of his pain, I do think right upper quadrant ultrasound is reasonable, as this may be biliary colic.  Patient's pain is improved morphine.  He is more comfortable now. [MT]  2218   IMPRESSION: 1. Cholelithiasis but no sonographic findings to suggest acute cholecystitis. 2. Normal sonographic appearance of the liver and no intra or extrahepatic biliary dilatation. [MT]    Clinical Course User Index [MT] Wyvonnia Dusky, MD    Final Clinical Impression(s) / ED Diagnoses Final diagnoses:  Gallstones  Right upper quadrant abdominal pain    Rx / DC Orders ED Discharge Orders          Ordered    oxyCODONE (ROXICODONE) 5 MG immediate  release tablet  Every 6 hours PRN        07/01/21 2224             Wyvonnia Dusky, MD 07/01/21 2322

## 2021-07-01 NOTE — ED Triage Notes (Signed)
Pt states his MD told him to come to ED for blood work needed related to his liver. Pt could not recall exact labs. Pt has a colostomy bag. Pt states he has pain and swelling to area above the stoma. Pt has hx of rectal cancer.

## 2021-07-01 NOTE — ED Notes (Signed)
Pt verbalized understanding of d/c, medication, and follow up care. Ambulatory with steady gait.  

## 2021-07-02 ENCOUNTER — Telehealth: Payer: Self-pay

## 2021-07-02 ENCOUNTER — Other Ambulatory Visit: Payer: Self-pay | Admitting: Family Medicine

## 2021-07-02 LAB — COMPREHENSIVE METABOLIC PANEL
ALT: 28 IU/L (ref 0–44)
AST: 21 IU/L (ref 0–40)
Albumin/Globulin Ratio: 1.7 (ref 1.2–2.2)
Albumin: 3.3 g/dL — ABNORMAL LOW (ref 3.8–4.9)
Alkaline Phosphatase: 76 IU/L (ref 44–121)
BUN/Creatinine Ratio: 17 (ref 9–20)
BUN: 15 mg/dL (ref 6–24)
Bilirubin Total: 0.2 mg/dL (ref 0.0–1.2)
CO2: 21 mmol/L (ref 20–29)
Calcium: 8.6 mg/dL — ABNORMAL LOW (ref 8.7–10.2)
Chloride: 102 mmol/L (ref 96–106)
Creatinine, Ser: 0.89 mg/dL (ref 0.76–1.27)
Globulin, Total: 2 g/dL (ref 1.5–4.5)
Glucose: 275 mg/dL — ABNORMAL HIGH (ref 65–99)
Potassium: 4.6 mmol/L (ref 3.5–5.2)
Sodium: 138 mmol/L (ref 134–144)
Total Protein: 5.3 g/dL — ABNORMAL LOW (ref 6.0–8.5)
eGFR: 104 mL/min/{1.73_m2} (ref >59)

## 2021-07-02 LAB — CBC WITH DIFFERENTIAL/PLATELET
Basophils Absolute: 0.1 10*3/uL (ref 0.0–0.2)
Basos: 1 %
EOS (ABSOLUTE): 0.3 10*3/uL (ref 0.0–0.4)
Eos: 4 %
Hematocrit: 50.8 % (ref 37.5–51.0)
Hemoglobin: 16.4 g/dL (ref 13.0–17.7)
Immature Grans (Abs): 0.4 10*3/uL — ABNORMAL HIGH (ref 0.0–0.1)
Immature Granulocytes: 5 %
Lymphocytes Absolute: 1.3 10*3/uL (ref 0.7–3.1)
Lymphs: 16 %
MCH: 28.8 pg (ref 26.6–33.0)
MCHC: 32.3 g/dL (ref 31.5–35.7)
MCV: 89 fL (ref 79–97)
Monocytes Absolute: 0.6 10*3/uL (ref 0.1–0.9)
Monocytes: 8 %
Neutrophils Absolute: 5.3 10*3/uL (ref 1.4–7.0)
Neutrophils: 66 %
Platelets: 216 10*3/uL (ref 150–450)
RBC: 5.69 x10E6/uL (ref 4.14–5.80)
RDW: 13.5 % (ref 11.6–15.4)
WBC: 8.2 10*3/uL (ref 3.4–10.8)

## 2021-07-02 NOTE — Telephone Encounter (Signed)
Transition Care Management Follow-up Telephone Call Date of discharge and from where: 07/01/2021-[facility] How have you been since you were released from the hospital? Patient is feeling a little better Any questions or concerns? No  Items Reviewed: Did the pt receive and understand the discharge instructions provided? Yes  Medications obtained and verified? Yes  Other? No  Any new allergies since your discharge? No  Dietary orders reviewed? N/A Do you have support at home? No   Home Care and Equipment/Supplies: Were home health services ordered? not applicable If so, what is the name of the agency? N/A  Has the agency set up a time to come to the patient's home? not applicable Were any new equipment or medical supplies ordered?  No What is the name of the medical supply agency? N/A Were you able to get the supplies/equipment? not applicable Do you have any questions related to the use of the equipment or supplies? No  Functional Questionnaire: (I = Independent and D = Dependent) ADLs: I  Bathing/Dressing- I  Meal Prep- I  Eating- I  Maintaining continence- I  Transferring/Ambulation- I  Managing Meds- I  Follow up appointments reviewed:  PCP Hospital f/u appt confirmed? No  Patient will call PCP today to schedule appointment. [facility] f/u appt confirmed? No  patient advised to follow up with General surgeon. Are transportation arrangements needed? No  If their condition worsens, is the pt aware to call PCP or go to the Emergency Dept.? Yes Was the patient provided with contact information for the PCP's office or ED? Yes Was to pt encouraged to call back with questions or concerns? Yes

## 2021-07-03 ENCOUNTER — Ambulatory Visit: Payer: Medicaid Other

## 2021-07-05 ENCOUNTER — Encounter: Payer: Self-pay | Admitting: Family Medicine

## 2021-07-05 DIAGNOSIS — K625 Hemorrhage of anus and rectum: Secondary | ICD-10-CM | POA: Insufficient documentation

## 2021-07-05 NOTE — Assessment & Plan Note (Signed)
Report rectal bleeding/clots and feeling like needing to have BM.  Exam benign for blood on gloved finger.  Unclear etiology of rectal bleeding given history of colorectal cancer now with colostomy.  Doubt acute GI bleed given normal vital signs and stable Hbg.  Possible malignant recurrance.   -FOBT from colostomy positive -Hbg 16.1, doubt GI bleed -CMP, CBC -CT abdomen w/wo contrast -Refer GI -Refer Oncology -Discussed with patient and wife that will need to get prior approval for CT and next available appointment with GI was next month. Vital signs and Hbg stable so do not think needs admission.  Advised that if showing any signs of acute bleeding to go to ED for evaluation.

## 2021-07-05 NOTE — Assessment & Plan Note (Signed)
A1c 8.7. Patient aware of results. Follow up with PCP

## 2021-07-05 NOTE — Assessment & Plan Note (Signed)
Requested Xanax for anxiety.  Given recent stressors will order Atrax 5 mg q hs x 15 tabs Follow up with PCP to discuss Anxiety management

## 2021-07-07 ENCOUNTER — Telehealth: Payer: Self-pay | Admitting: Hematology

## 2021-07-07 NOTE — Telephone Encounter (Signed)
Scheduled appts per 7/18 sch msg. Pt's SO, Myriam Jacobson, is aware of appts.

## 2021-07-14 NOTE — Progress Notes (Signed)
    SUBJECTIVE:   CHIEF COMPLAINT / HPI: Groin mass  Groin mass Patient reports that he has noticed a enlargement in his perineum that changes, is not painful and appears to move.  He reports that it was initially a bump that he felt was more internal but now seems to be more protruding.  He reports that he seems to "grow".  Patient notes he has a history of rectal adenocarcinoma and has not had radiation therapy in the past.  Patient reports chronic rectal drainage of clear fluid.  He has not noticed any bleeding associated with this new enlargement.  Patient request for refill for his albuterol inhaler and nebulizer.  He reports that he still needs a rescue inhaler to use on an as-needed basis for shortness of breath with exertion.  PERTINENT  PMH / PSH:  Rectal adenocarcinoma s/p proctocolectomy, 2017 Chronic diarrhea Type 2 diabetes Hyperlipidemia  OBJECTIVE:   BP 112/80   Pulse 84   Ht '5\' 11"'$  (1.803 m)   Wt 173 lb (78.5 kg)   SpO2 97%   BMI 24.13 kg/m    General: male appearing stated age in no acute distress Cardio: Normal S1 and S2, no S3 or S4. Rhythm is regular. No murmurs or rubs.  Bilateral radial pulses palpable Pulm: Clear to auscultation bilaterally, no crackles, wheezing, or diminished breath sounds. Normal respiratory effort, stable on Rectal exam: Do not palpate any masses, some clear fluid drainage, no gross blood on rectal exam Perineum exam: Able to palpate small nonprotruding mass that is soft and mobile, patient examined in both prone and supine positions, able to palpate area with more definition in supine position, feels soft and mobile   ASSESSMENT/PLAN:   Hernia of pelvic floor Based on soft and mobile, enlarging and receding nature of described enlargement, suspect that patient has pelvic floor hernia however given his history of rectal adenocarcinoma, believe that patient will benefit from imaging to rule out potentially malignant masses. Discussed  suspicion with patient, ordered abdominal pelvic CT We will follow-up with results once available If CT imaging is positive, will refer patient to surgery for repair   COPD with acute exacerbation ([place]) Refills for albuterol nebulizer and inhaler sent to pharmacy per patient request     Eulis Foster, MD [place]

## 2021-07-15 ENCOUNTER — Other Ambulatory Visit: Payer: Self-pay

## 2021-07-15 ENCOUNTER — Ambulatory Visit (INDEPENDENT_AMBULATORY_CARE_PROVIDER_SITE_OTHER): Payer: Medicaid Other | Admitting: Family Medicine

## 2021-07-15 VITALS — BP 112/80 | HR 84 | Ht 71.0 in | Wt 173.0 lb

## 2021-07-15 DIAGNOSIS — R0689 Other abnormalities of breathing: Secondary | ICD-10-CM

## 2021-07-15 DIAGNOSIS — J441 Chronic obstructive pulmonary disease with (acute) exacerbation: Secondary | ICD-10-CM | POA: Diagnosis not present

## 2021-07-15 DIAGNOSIS — K458 Other specified abdominal hernia without obstruction or gangrene: Secondary | ICD-10-CM | POA: Insufficient documentation

## 2021-07-15 MED ORDER — ALBUTEROL SULFATE HFA 108 (90 BASE) MCG/ACT IN AERS: 2.0000 | INHALATION_SPRAY | RESPIRATORY_TRACT | 2 refills | Status: DC | PRN

## 2021-07-15 MED ORDER — ALBUTEROL SULFATE (2.5 MG/3ML) 0.083% IN NEBU: 2.5000 mg | INHALATION_SOLUTION | Freq: Four times a day (QID) | RESPIRATORY_TRACT | 0 refills | Status: DC | PRN

## 2021-07-15 NOTE — Assessment & Plan Note (Signed)
Refills for albuterol nebulizer and inhaler sent to pharmacy per patient request

## 2021-07-15 NOTE — Patient Instructions (Addendum)
It was a pleasure to see you today!  Thank you for choosing [facility] for your primary care.   [name] was seen for groin mass.   Our plans for today were: I am suspicious that this is a pelvic floor hernia. Given your history, we will order (abdominal and pelvic CT) imaging to either confirm a hernia or if we need to investigate other potential causes. I will follow with you once the results from your imaging are available.    Best Wishes,   Dr. Alba Cory

## 2021-07-15 NOTE — Assessment & Plan Note (Signed)
Based on soft and mobile, enlarging and receding nature of described enlargement, suspect that patient has pelvic floor hernia however given his history of rectal adenocarcinoma, believe that patient will benefit from imaging to rule out potentially malignant masses. Discussed suspicion with patient, ordered abdominal pelvic CT We will follow-up with results once available If CT imaging is positive, will refer patient to surgery for repair

## 2021-07-16 ENCOUNTER — Telehealth: Payer: Self-pay

## 2021-07-16 NOTE — Telephone Encounter (Signed)
Patient aware of CT at [facility].  [name], [name]

## 2021-07-20 ENCOUNTER — Telehealth: Payer: Self-pay | Admitting: Hematology

## 2021-07-20 NOTE — Telephone Encounter (Signed)
Left message with changed upcoming appointment per 8/1 staff message.

## 2021-07-22 NOTE — Progress Notes (Signed)
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 Diverticulosis. Nonspecific low-level lymphadenopathy. Lower lobe emphysema, small 3-4 mm left lower lobe lung nodules.    07/28/2016 Initial Diagnosis   Rectal adenocarcinoma ([place])    07/28/2016 Initial Biopsy   Rectal mass biopsy showed invasive adenocarcinoma, polyps in the ascending colon and rectum showed tubular adenoma     07/28/2016 Procedure   Colonoscopy showed a nearly circumferential mass in the distal rectum, 4 polyps in the descending colon, diverticulosis in the left colon.     07/29/2016 Procedure   Low EUS showed clearly malignant 6 cm long, nearly circumferential mass in the distal rectum, with distal edge located to 1 cm from the annual approach, multiple prior rectal lymph nodes (6) are suspicious ,uT3 N2     08/17/2016 - 09/24/2016 Radiation Therapy   Neoadjuvant radiation to his rectal cancer 1) Rectum/ 45 Gy in 25 fx                      2) Boost/ 5.4 Gy in 3 fx     08/17/2016 - 09/24/2016 Chemotherapy   Xeloda 1500 mg twice daily with concurrent irradiation    08/18/2016 Imaging   PET scan showed hypermetabolic rectal mass, perirectal node 35mm with mild increased uptake, no hypermetabolic distant metastasis, small pulmonary nodules are too small to characterize by PET.     11/29/2016 Imaging   CT of the C/A/P W Contrast IMPRESSION: 1. Interval treatment changes within the perirectal fat with otherwise stable rectosigmoid colon wall  thickening. 2. No progressive adenopathy or distant metastases identified. 3. Stable bilateral pulmonary nodules. Short-term stability (for 4 months) is reassuring, although the nodules are too small to optimally characterize by previous PET-CT. Continued CT follow-up recommended.    12/01/2016 Surgery   Colon total resection and proctocolectomy for rectal cancer and Lynch syndrome     12/01/2016 Pathology Results   Proctocolectomy showed scattered small residual foci of invasive moderately differentiated adenocarcinoma embedded in submucosa tissue with extensive neoadjuvant effect, 2.5 cm in greatest time mention, adenocarcinoma invades through muscularis mucosa and involves subserosal soft tissue, margins are negative, 2 of 35 lymph nodes are positive for metastatic adenocarcinoma, 2 calcified and necrotic soft tissue nodules are also present without visible tumor seen.    01/07/2017 - 05/26/2017 Chemotherapy   Adjuvant chemo CAPOX, changed to FOLFOX from cycle 2 due to poor tolerance, a total of 4.5 months therapy      01/26/2017 Surgery   PLACEMENT OF PORT-A-CATH CENTRAL LINE WITH FLUOROSCOPY AND ULTRASOUND    06/21/2017 Imaging   CT Chest w contrast 06/21/2017 IMPRESSION: 1. No evidence of metastatic disease. 2.  Emphysema (ICD10-J43.9). 3. Scattered subpleural pulmonary nodules measure 5 mm or less in size, stable and likely subpleural lymph nodes.    09/21/2017 Procedure   Pouchoscopy by Dr. Marcello Moores on 09/21/17 IMPRESSION  - The examined portion of the ileum was normal. - No specimens collected.    09/22/2017 Surgery   TAKEDOWN OF LOOP ILEOSTOMY  by Dr. Johney Maine on 09/22/17  Diagnosis 09/22/17  Ileostomy, loop SKIN AND COLONIC MUCOSA NEGATIVE FOR CARCINOMA    12/06/2017 Imaging   CT CAP W Contrast 12/06/17 IMPRESSION: 1. Postoperative changes of total proctocolectomy and ileoanal anastomosis without evidence of metastatic disease. Ileoanal anastomosis appears tethered to the  sacrum by presacral thickening. 2.  Emphysema (ICD10-J43.9).    01/12/2018 Procedure   Pouchoscopy by Dr. Marcello Moores 01/12/18 - Ileoanal anastomotic stricture found on perianal exam. - No specimens collected. - Erythematous mucosa in the ileoanal pouch. Biopsied.   Pathology results  Diagnosis 01/12/18 Rectum, biopsy, J Pouch mucosa - SMALL BOWEL TYPE MUCOSA WITH CHRONIC INFLAMMATION - NO GRANULOMATA, DYSPLASIA, OR MALIGNANCY IDENTIFIED    06/25/2019 Imaging   CT CAP IMPRESSION: 1. Low anterior section and permanent right sided ileostomy, without evidence of recurrent or metastatic disease. Probable postoperative seroma in the presacral space, stable. 2. Mild hepatomegaly. 3. Cholelithiasis. 4.  Emphysema (ICD10-J43.9).   06/19/2020 Imaging   CT Chest  IMPRESSION: 1. Interval development of multiple small nodules in the lungs compared with 1 year ago. Given history of rectal cancer, metastatic disease is suspected. Most these nodules are very small and continued close follow-up is warranted. 2. Moderate emphysema   07/07/2020 Imaging   CT AP w contrast  IMPRESSION: 1. No evidence of metastatic disease in the abdomen or pelvis. Pulmonary nodules were better seen on dedicated CT chest 06/19/2020. 2. Hepatic steatosis. 3. Cholelithiasis. 4.  Emphysema (ICD10-J43.9).   09/08/2020 Imaging   CT chest  IMPRESSION: 1. Fluctuating bilateral pulmonary nodularity. Although several of the right lung nodule seen on the most recent study have improved/resolved, there are at least 2 new nodules in the right lower and left upper lobes. The fluctuation suggests a possible inflammatory etiology, although metastatic disease cannot be excluded. Continued CT follow-up in 3-6 months recommended. 2. Stable small mediastinal lymph nodes, likely reactive. 3. Hepatic steatosis. 4. Right IJ Port-A-Cath tip is located inferior medially in the right atrium near the tricuspid valve, unchanged. 5.  Emphysema (ICD10-J43.9).       CURRENT THERAPY: Surveillance  INTERVAL HISTORY: [name] 50 y.o. male returns to the clinic today for a follow-up visit.  The patient was last seen in the clinic on 08/22/2020.  At that time, it was recommended for the patient to follow-up in 2 months.  The patient got lost to follow-up since that time.  When he was last seen, the patient had some new interval development of small bilateral pulmonary nodules compared to the prior year.  Metastatic disease cannot be ruled out and unable to biopsy due to the size.  It was recommended that the patient had a follow-up CT scan of the chest in 4 months which he never had done due to being lost to follow-up.  Of note, the patient does follow with [facility] with Dr. Loanne Drilling due to his history of COPD and cough and moderate emphysema.  He is currently on Spiriva.  The patient did have a CT abdomen and pelvis performed in July 2022 though which was negative for any disease recurrence. He is scheduled for another CT abdomen/pelvis on 08/05/21 and is following up with GI next week for he is noted some bloody output in his ostomy as well as a small amount of bleeding near his anus on 7/13 for which he went to the ER. He denies any more blood since this time. He also states he is going to have a cholecystectomy  in the near future for cholelithiasis  Overall, the patient is feeling well today.  He denies significant fatigue.  He reports a good appetite.  He denies any abdominal pain, nausea, vomiting, diarrhea, constipation, bloating, or recent blood in the stool.  He denies any unexplained weight loss.  He is still smoking approximately 1 pack of cigarettes per day.  He reports his baseline dyspnea on exertion but states that this has not changed recently.  He denies any chest pain or hemoptysis.  He reports a cough on and off but nothing unusual for him recently. The patient is here today for evaluation and repeat blood  work.    MEDICAL HISTORY: Past Medical History:  Diagnosis Date   Anxiety    C. 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 In families with MSH6 mutations, screening can start at age 48 years since the onset of colon cancer is later in these families.  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  MEDICATIONS:  Current Outpatient Medications  Medication Sig Dispense Refill   Accu-Chek Softclix Lancets lancets Use as instructed 100 each 12   albuterol (PROVENTIL) (2.5 MG/3ML) 0.083% nebulizer solution Take 3 mLs (2.5 mg total) by nebulization every 6 (six) hours as needed for wheezing or shortness of breath. 75 mL 0   albuterol (VENTOLIN HFA) 108 (90 Base) MCG/ACT inhaler Inhale 2 puffs into the lungs every 4 (four) hours as needed for wheezing or shortness of breath. 18 g 2   atorvastatin (LIPITOR) 20 MG tablet Take 1 tablet (20 mg total) by mouth daily. 90 tablet 3   budesonide-formoterol (SYMBICORT) 160-4.5 MCG/ACT inhaler Inhale 2 puffs into the lungs in the morning and at bedtime. 1 each 12   gabapentin (NEURONTIN) 300 MG capsule TAKE 2 CAPSULES (600 MG TOTAL) BY MOUTH 3 (THREE) TIMES DAILY. 180 capsule 6   glucose blood (ACCU-CHEK GUIDE) test strip Use as instructed 100 each 12   hydrOXYzine  (ATARAX/VISTARIL) 10 MG tablet Take 0.5 tablets (5 mg total) by mouth at bedtime as needed. 15 tablet 0   metFORMIN (GLUCOPHAGE) 500 MG tablet TAKE 1 TABLET BY MOUTH EVERY MORNING AND AT BEDTIME 180 tablet 1   sertraline (ZOLOFT) 100 MG tablet TAKE 1 TABLET BY MOUTH EVERY DAY 90 tablet 3   SPIRIVA RESPIMAT 2.5 MCG/ACT AERS INHALE 2 PUFFS BY MOUTH INTO THE LUNGS DAILY 4 g 1   No current facility-administered medications for this visit.   Facility-Administered Medications Ordered in Other Visits  Medication Dose Route Frequency Provider Last Rate Last Admin   sodium chloride flush (NS) 0.9 % injection 10 mL  10 mL Intravenous PRN Truitt Merle, MD   10 mL at 11/18/17 0953    SURGICAL HISTORY:  Past Surgical History:  Procedure Laterality Date   COLON SURGERY  EUS N/A 07/29/2016   Procedure: LOWER ENDOSCOPIC ULTRASOUND (EUS);  Surgeon: Milus Banister, MD;  Location: [facility];  Service: Endoscopy;  Laterality: N/A;   ILEOSTOMY CLOSURE N/A 09/22/2017   Procedure: TAKEDOWN OF LOOP ILEOSTOMY;  Surgeon: Michael Boston, MD;  Location: [facility];  Service: General;  Laterality: N/A;   LYSIS OF ADHESION N/A 05/10/2018   Procedure: LYSIS OF ADHESION;  Surgeon: Michael Boston, MD;  Location: [facility];  Service: General;  Laterality: N/A;   PORTACATH PLACEMENT N/A 01/26/2017   Procedure: PLACEMENT OF PORT-A-CATH CENTRAL LINE WITH FLUOROSCOPY AND ULTRASOUND;  Surgeon: Michael Boston, MD;  Location: [place];  Service: General;  Laterality: N/A;   POUCHOSCOPY N/A 09/21/2017   Procedure: POUCHOSCOPY;  Surgeon: Leighton Ruff, MD;  Location: [facility];  Service: Endoscopy;  Laterality: N/A;   POUCHOSCOPY N/A 01/12/2018   Procedure: POUCHOSCOPY WITH BIOPSIES;  Surgeon: Leighton Ruff, MD;  Location: [facility];  Service: Endoscopy;  Laterality: N/A;  Local   PROCTOSCOPY N/A 12/01/2016   Procedure: RIGID PROCTOSCOPY;  Surgeon: Michael Boston, MD;  Location: [facility];  Service: General;  Laterality:  N/A;   RECTAL EXAM UNDER ANESTHESIA N/A 05/10/2018   Procedure: RECTAL EXAM UNDER ANESTHESIA;  Surgeon: Michael Boston, MD;  Location: [facility];  Service: General;  Laterality: N/A;   TOE AMPUTATION Left    XI ROBOTIC ASSISTED LOWER ANTERIOR RESECTION N/A 12/01/2016   Procedure: XI ROBOTIC ASSISTED PROCTOCOLECTOMY WITH ILLEOPOUCH ANASTAMOSIS WITH DIVERTING ILLEOSTOMY;  Surgeon: Michael Boston, MD;  Location: [facility];  Service: General;  Laterality: N/A;   XI ROBOTIC ASSISTED LOWER ANTERIOR RESECTION N/A 05/10/2018   Procedure: XI ROBOTIC RESECTION OF ILEAL J POUCH, LYSIS OF ADHESIONS, WITH CREATION OF PERMANENT END ILEOSTOMY.;  Surgeon: Michael Boston, MD;  Location: [facility];  Service: General;  Laterality: N/A;    REVIEW OF SYSTEMS:   Review of Systems  Constitutional: Negative for appetite change, chills, fatigue, fever and unexpected weight change.  HENT: Negative for mouth sores, nosebleeds, sore throat and trouble swallowing.   Eyes: Negative for eye problems and icterus.  Respiratory: Negative for cough, hemoptysis, shortness of breath and wheezing.   Cardiovascular: Negative for chest pain and leg swelling.  Gastrointestinal: Negative for abdominal pain, constipation, diarrhea, nausea and vomiting.  Genitourinary: Negative for bladder incontinence, difficulty urinating, dysuria, frequency and hematuria.   Musculoskeletal: Negative for back pain, gait problem, neck pain and neck stiffness.  Skin: Negative for itching and rash.  Neurological: Negative for dizziness, extremity weakness, gait problem, headaches, light-headedness and seizures.  Hematological: Negative for adenopathy. Does not bruise/bleed easily.  Psychiatric/Behavioral: Negative for confusion, depression and sleep disturbance. The patient is not nervous/anxious.     PHYSICAL EXAMINATION:  Blood pressure 105/73, pulse 75, temperature 97.9 F (36.6 C), temperature source Oral, resp. rate 18, height $RemoveBe'5\' 11"'paDefqrum$  (1.803 m), weight 173 lb  12.8 oz (78.8 kg), SpO2 97 %.  ECOG PERFORMANCE STATUS: 1  Physical Exam  Constitutional: Oriented to person, place, and time and well-developed, well-nourished, and in no distress.  HENT:  Head: Normocephalic and atraumatic.  Mouth/Throat: Oropharynx is clear and moist. No oropharyngeal exudate.  Eyes: Conjunctivae are normal. Right eye exhibits no discharge. Left eye exhibits no discharge. No scleral icterus.  Neck: Normal range of motion. Neck supple.  Cardiovascular: Normal rate, regular rhythm, normal heart sounds and intact distal pulses.   Pulmonary/Chest: Effort normal and breath sounds normal. No respiratory distress. No wheezes. No rales.  Abdominal: Soft. Bowel sounds are normal.  Exhibits no distension and no mass. There is no tenderness. (+) colostomy bag with watery stool, no hematochezia. Musculoskeletal: Normal range of motion. Exhibits no edema.  Lymphadenopathy:    No cervical adenopathy.  Neurological: Alert and oriented to person, place, and time. Exhibits normal muscle tone. Gait normal. Coordination normal.  Skin: Skin is warm and dry. No rash noted. Not diaphoretic. No erythema. No pallor.  Psychiatric: Mood, memory and judgment normal.  Vitals reviewed.  LABORATORY DATA: Lab Results  Component Value Date   WBC 9.4 07/24/2021   HGB 15.5 07/24/2021   HCT 45.8 07/24/2021   MCV 87.4 07/24/2021   PLT 191 07/24/2021      Chemistry      Component Value Date/Time   NA 139 07/24/2021 1004   NA 138 07/01/2021 1401   NA 139 11/18/2017 0933   K 4.1 07/24/2021 1004   K 3.6 11/18/2017 0933   CL 108 07/24/2021 1004   CO2 24 07/24/2021 1004   CO2 26 11/18/2017 0933   BUN 9 07/24/2021 1004   BUN 15 07/01/2021 1401   BUN 8.3 11/18/2017 0933   CREATININE 0.94 07/24/2021 1004   CREATININE 0.85 06/26/2020 1300   CREATININE 0.9 11/18/2017 0933      Component Value Date/Time   CALCIUM 8.6 (L) 07/24/2021 1004   CALCIUM 8.7 11/18/2017 0933   ALKPHOS 74 07/24/2021  1004   ALKPHOS 61 11/18/2017 0933   AST 25 07/24/2021 1004   AST 30 06/26/2020 1300   AST 24 11/18/2017 0933   ALT 33 07/24/2021 1004   ALT 40 06/26/2020 1300   ALT 15 11/18/2017 0933   BILITOT 0.5 07/24/2021 1004   BILITOT 0.2 07/01/2021 1401   BILITOT 0.2 (L) 06/26/2020 1300   BILITOT 0.33 11/18/2017 0933       RADIOGRAPHIC STUDIES:  CT ABDOMEN PELVIS W CONTRAST  Result Date: 07/01/2021 CLINICAL DATA:  Right upper quadrant abdominal pain bloody from ostomy. EXAM: CT ABDOMEN AND PELVIS WITH CONTRAST TECHNIQUE: Multidetector CT imaging of the abdomen and pelvis was performed using the standard protocol following bolus administration of intravenous contrast. CONTRAST:  56mL OMNIPAQUE IOHEXOL 350 MG/ML SOLN COMPARISON:  CT September 08 2020 and June 27, 2020. FINDINGS: Lower chest: Emphysematous changes in lung bases. Calcified granulomas. No acute abnormality. Hepatobiliary: No suspicious hepatic lesion. Numerous small cholelithiasis measuring up to 5 mm without findings of acute cholecystitis. No biliary ductal dilation. Pancreas: Within normal limits. Spleen: Normal in size without focal abnormality. Adrenals/Urinary Tract: Bilateral adrenal glands are unremarkable. No hydronephrosis. Subcentimeter hypodense right renal lesion, technically too small to accurately characterize but statistically likely represent a cyst, stable from prior. Urinary bladder is decompressed limiting evaluation. Stomach/Bowel: Stomach is grossly unremarkable. No pathologic dilation of small bowel. Postsurgical change of low anterior resection and right lower quadrant ileostomy. Slightly decreased size the organized gas and fluid collection presacral surgical bed now measuring 2.3 x 1.6 cm previously 2.7 x 2.0 cm on image 71/2. Overall stable appearance the associated presacral soft tissue thickening. Vascular/Lymphatic: No abdominal aortic aneurysm. Stable prominent upper abdominal, retroperitoneal and mesenteric lymph  nodes, with the indexed periportal lymph node measures 12 mm on axial image 25/2. Reproductive: No acute finding Other: No abdominopelvic ascites. Stable mild mesenteric haziness and nodularity. 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 No aggressive lytic or blastic lesions bone. IMPRESSION: 1. No acute findings in the abdomen or pelvis. 2. Stable examination with postsurgical change of low anterior  resection and right lower quadrant ileostomy. Without evidence new or progressive the abdomen or pelvis. 3. Cholelithiasis without findings of acute cholecystitis. 4.  Emphysema (ICD10-J43.9). Electronically Signed   By: Dahlia Bailiff MD   On: 07/01/2021 20:37   US Abdomen Limited RUQ (LIVER/GB)  Result Date: 07/01/2021 CLINICAL DATA:  Abdominal pain. EXAM: ULTRASOUND ABDOMEN LIMITED RIGHT UPPER QUADRANT COMPARISON:  CT scan, same date. FINDINGS: Gallbladder: Numerous shadowing gallstones are noted the gallbladder. The largest measures 1.7 cm. No gallbladder wall thickening, pericholecystic fluid or sonographic Murphy sign to suggest acute cholecystitis. Common bile duct: Diameter: 3.0 mm Liver: Normal echogenicity without focal lesion or biliary dilatation. Portal vein is patent on color Doppler imaging with normal direction of blood flow towards the liver. Other: None. IMPRESSION: 1. Cholelithiasis but no sonographic findings to suggest acute cholecystitis. 2. Normal sonographic appearance of the liver and no intra or extrahepatic biliary dilatation. Electronically Signed   By: Marijo Sanes M.D.   On: 07/01/2021 22:12     ASSESSMENT/PLAN:  GARRETH BURNSWORTH is a 50 y.o. male with   1. 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 He was treated with neoadjuvant concurrent chemoRT, colon surgery, adjuvant chemo and ileostomy reversal.  -He had ileostomy placed again  due to uncontrolled diarrhea. It's manageable now. -His 06/26/20 GuardantReveal Testing was negative for circulating tumor cells -Since early 2021 he had progressive and worsened cough. CT Chest from 06/19/20 shows Interval development of multiple small nodules in the lungs compared with 1 year ago. Given history of rectal cancer, metastatic disease is possible but it's not feasible to biopsy.  -His pulmonary symptoms has improved with COPD treatment and temporary quitting smoking -When the patient was last seen last year on 09/18/20 his CT chest from 09/08/20 was reviewed which showed overall stable nodules in bilateral lungs, some resolved, a few new nodules bone metastasis less likely.  Therefore, Dr. Burr Medico recommended a follow up CT chest in 4 months. The patient has not been back to the clinic since that time and this was not completed. -He had a CT of the abdomen/pelvis on 07/01/21 which did not show any acute findings.  There is also no evidence of progressive disease.  He is scheduled to follow-up with gastroenterology next week regarding the incident of blood in the stool and near the rectum last month.  No evidence of anemia on labs today.  -He is scheduled to undergo a CT of the abdomen pelvis on 08/05/2021.  We will reschedule his CT scan of the chest the same day just to follow the pulmonary nodules 1 more time. -I discussed with Dr. Burr Medico earlier today that given that he is 5 years out from his diagnosis, disease recurrence is less likely.  He was given the option to follow-up with [place] 1 more time in 1 year versus following closely with his other providers. -The patient would like to follow-up 1 more time in 1 year. -If his upcoming CT scan does not show any evidence of recurrent or metastatic disease, the patient will proceed with having his Port-A-Cath removed in approximately 1 month from now.  I placed the order today. -F/u in 1 year  2. Multiple lung nodules, dyspnea on exertion, Cough, Emphysema    -He has a history of smoking and is currently still smokes 1ppd. He has known cough which he attributed to smoking. He previously declined nicotine patched, but still working on  quitting.   -He had progressive SOB and cough in early 2021. CT chest on 06/19/20 showed interval development of multiple small b/l pulmonary nodules compared to prior year. There is concern for metastatic disease. Scan also shows moderate emphysema.  -F/u with [facility] Dr. Loanne Drilling  -His symptoms have improved on Spiriva. He has not needed nebulizer recently. -He notes stable breathing today (07/24/2021).  -We will arrange for a follow-up CT scan of the chest on 08/05/2021 to follow-up on the pulmonary nodules. -Again, the patient was strongly encouraged to quit smoking today.  I also discussed that moving forward, he would likely be a candidate for the lung cancer screening program in the future.  However, we are going to undergo a normal CT scan of the chest at this time.   3. 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    4. Anxiety, Depression, Social issues  -His family member previously noted he has been nervous about cancer recurrence. He notes smoking helps him but understands he should quit.  -He is out of work, on disability  -Dr. Burr Medico previously encouraged him to increase activity and stay busy to help him quit smoking.    Plan  -CT scan reviewed from 07/01/21, no evidence of disease progression -CEA still pending -Labs acceptable today. No anemia -Follow-up in 1 year -Port-A-Cath removal in approximately 1 month -CT of the chest to be performed on 08/05/2021 -Encouraged to quit smoking    Orders Placed This Encounter  Procedures   CT Chest Wo Contrast    Standing Status:   Future    Standing  Expiration Date:   07/24/2022    Scheduling Instructions:     Please add on the same day he is getting the abdomen and pelvis  scanned 08/05/21    Order Specific Question:   Preferred imaging location?    Answer:   [facility]   IR REMOVAL TUN ACCESS W/ PORT W/O FL MOD SED    Standing Status:   Future    Standing Expiration Date:   07/24/2022    Order Specific Question:   Reason for Exam (SYMPTOM  OR DIAGNOSIS REQUIRED)    Answer:   Remove port in September 2022.    Order Specific Question:   Preferred Imaging Location?    Answer:   [facility]   CEA (IN HOUSE-[facility])   CEA (IN HOUSE-[facility])FOR [facility] ONLY    Standing Status:   Future    Standing Expiration Date:   07/24/2022   CMP ([place] only)    Standing Status:   Future    Standing Expiration Date:   07/24/2022   CBC with Differential ([place] Only)    Standing Status:   Future    Standing Expiration Date:   07/24/2022      The total time spent in the appointment was 20-29 minutes  Recia Sons L Ziair Penson, PA-C 07/24/21

## 2021-07-23 ENCOUNTER — Ambulatory Visit (INDEPENDENT_AMBULATORY_CARE_PROVIDER_SITE_OTHER): Payer: Medicaid Other | Admitting: Family Medicine

## 2021-07-23 ENCOUNTER — Other Ambulatory Visit: Payer: Self-pay

## 2021-07-23 VITALS — BP 106/68 | HR 75 | Ht 71.0 in | Wt 173.6 lb

## 2021-07-23 DIAGNOSIS — E1165 Type 2 diabetes mellitus with hyperglycemia: Secondary | ICD-10-CM | POA: Diagnosis present

## 2021-07-23 NOTE — Progress Notes (Signed)
    SUBJECTIVE:   CHIEF COMPLAINT / HPI:   Diabetes follow-up Patient presents because he is concerned about his sugars. Has been checking his sugars daily or twice daily at home and they are essentially always in the 200s. The lowest he has ever seen is 160. He is taking Metformin '500mg'$  once daily. Per chart review, it is prescribed twice daily. His diabetes is a relatively new diagnosis (1 year ago). A1c was 8.7 at time of diagnosis. Patient reports he drinks regular coke multiple times per day and drinks sweet tea daily as well.  PERTINENT  PMH / PSH: T2DM, COPD, tobacco use, HLD, Lynch syndrome  OBJECTIVE:   BP 106/68   Pulse 75   Ht '5\' 11"'$  (1.803 m)   Wt 173 lb 9.6 oz (78.7 kg)   SpO2 96%   BMI 24.21 kg/m   General: NAD, pleasant, able to participate in exam Cardiac: RRR, S1 S2 present. normal heart sounds, no murmurs. Respiratory: CTAB, normal effort, No wheezes, rales or rhonchi Extremities: no edema or cyanosis. 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 No other deformities, ulcerations, or skin break down. Sensation intact to light touch. Sensation intact to monofilament testing on plantar aspect of all toes. No sensation to monofilament testing on remainder of feet  ASSESSMENT/PLAN:   Type 2 diabetes mellitus with hyperglycemia, without long-term current use of insulin ([place]) Not well controlled. A1c 8.4% last month. Home glucose readings have been in the 200s consistently. He endorses drinking regular coke multiple times daily as well as sweet tea multiple times daily. -Increase Metformin from '500mg'$  daily to '500mg'$  BID -Extensive counseling on reducing dietary sugar intake. He is agreeable and will cut out regular soda entirely. Will reduce sweet tea intake as well -Foot exam performed today -States he recently had eye exam. Will attempt to get these  records. -Return in 1 month    Alcus Dad, MD [place]

## 2021-07-23 NOTE — Patient Instructions (Addendum)
It was great to see you!  Things we discussed at today's visit: - You should take your Metformin TWICE daily. Take 1 pill in the morning and 1 in the evening for a total of '1000mg'$  per day. - PLEASE cut out regular soda entirely - You should also work on cutting back on the sweet tea intake. - Continue checking your sugars and bring a written log to your next appointment - You need to see an optometrist or ophthalmologist (eye doctor). Please call to schedule this appointment as soon as possible. You can simply google eye doctors in [place] and ask if they take your insurance   Take care and seek immediate care sooner if you develop any concerns.  Dr. [facility]

## 2021-07-23 NOTE — Assessment & Plan Note (Signed)
Not well controlled. A1c 8.4% last month. Home glucose readings have been in the 200s consistently. He endorses drinking regular coke multiple times daily as well as sweet tea multiple times daily. -Increase Metformin from '500mg'$  daily to '500mg'$  BID -Extensive counseling on reducing dietary sugar intake. He is agreeable and will cut out regular soda entirely. Will reduce sweet tea intake as well -Foot exam performed today -States he recently had eye exam. Will attempt to get these records. -Return in 1 month

## 2021-07-24 ENCOUNTER — Inpatient Hospital Stay (HOSPITAL_BASED_OUTPATIENT_CLINIC_OR_DEPARTMENT_OTHER): Payer: Medicaid Other | Admitting: Physician Assistant

## 2021-07-24 ENCOUNTER — Encounter: Payer: Self-pay | Admitting: Physician Assistant

## 2021-07-24 ENCOUNTER — Other Ambulatory Visit: Payer: Self-pay

## 2021-07-24 ENCOUNTER — Other Ambulatory Visit: Payer: Self-pay | Admitting: Family Medicine

## 2021-07-24 ENCOUNTER — Inpatient Hospital Stay: Payer: Medicaid Other | Attending: Physician Assistant

## 2021-07-24 VITALS — BP 105/73 | HR 75 | Temp 97.9°F | Resp 18 | Ht 71.0 in | Wt 173.8 lb

## 2021-07-24 DIAGNOSIS — R918 Other nonspecific abnormal finding of lung field: Secondary | ICD-10-CM

## 2021-07-24 DIAGNOSIS — C20 Malignant neoplasm of rectum: Secondary | ICD-10-CM

## 2021-07-24 DIAGNOSIS — J439 Emphysema, unspecified: Secondary | ICD-10-CM | POA: Diagnosis not present

## 2021-07-24 DIAGNOSIS — F17210 Nicotine dependence, cigarettes, uncomplicated: Secondary | ICD-10-CM | POA: Diagnosis not present

## 2021-07-24 DIAGNOSIS — F329 Major depressive disorder, single episode, unspecified: Secondary | ICD-10-CM | POA: Diagnosis not present

## 2021-07-24 DIAGNOSIS — F419 Anxiety disorder, unspecified: Secondary | ICD-10-CM | POA: Diagnosis not present

## 2021-07-24 DIAGNOSIS — Z800 Family history of malignant neoplasm of digestive organs: Secondary | ICD-10-CM | POA: Insufficient documentation

## 2021-07-24 DIAGNOSIS — R059 Cough, unspecified: Secondary | ICD-10-CM | POA: Insufficient documentation

## 2021-07-24 DIAGNOSIS — R0600 Dyspnea, unspecified: Secondary | ICD-10-CM | POA: Diagnosis not present

## 2021-07-24 DIAGNOSIS — Z9049 Acquired absence of other specified parts of digestive tract: Secondary | ICD-10-CM | POA: Insufficient documentation

## 2021-07-24 DIAGNOSIS — Z1509 Genetic susceptibility to other malignant neoplasm: Secondary | ICD-10-CM | POA: Insufficient documentation

## 2021-07-24 LAB — COMPREHENSIVE METABOLIC PANEL WITH GFR
ALT: 33 U/L (ref 0–44)
AST: 25 U/L (ref 15–41)
Albumin: 2.6 g/dL — ABNORMAL LOW (ref 3.5–5.0)
Alkaline Phosphatase: 74 U/L (ref 38–126)
Anion gap: 7 (ref 5–15)
BUN: 9 mg/dL (ref 6–20)
CO2: 24 mmol/L (ref 22–32)
Calcium: 8.6 mg/dL — ABNORMAL LOW (ref 8.9–10.3)
Chloride: 108 mmol/L (ref 98–111)
Creatinine, Ser: 0.94 mg/dL (ref 0.61–1.24)
GFR, Estimated: >60 mL/min
Glucose, Bld: 200 mg/dL — ABNORMAL HIGH (ref 70–99)
Potassium: 4.1 mmol/L (ref 3.5–5.1)
Sodium: 139 mmol/L (ref 135–145)
Total Bilirubin: 0.5 mg/dL (ref 0.3–1.2)
Total Protein: 5.5 g/dL — ABNORMAL LOW (ref 6.5–8.1)

## 2021-07-24 LAB — CBC WITH DIFFERENTIAL/PLATELET
Abs Immature Granulocytes: 0.47 10*3/uL — ABNORMAL HIGH (ref 0.00–0.07)
Basophils Absolute: 0.1 10*3/uL (ref 0.0–0.1)
Basophils Relative: 1 %
Eosinophils Absolute: 0.2 10*3/uL (ref 0.0–0.5)
Eosinophils Relative: 3 %
HCT: 45.8 % (ref 39.0–52.0)
Hemoglobin: 15.5 g/dL (ref 13.0–17.0)
Immature Granulocytes: 5 %
Lymphocytes Relative: 14 %
Lymphs Abs: 1.3 10*3/uL (ref 0.7–4.0)
MCH: 29.6 pg (ref 26.0–34.0)
MCHC: 33.8 g/dL (ref 30.0–36.0)
MCV: 87.4 fL (ref 80.0–100.0)
Monocytes Absolute: 0.6 10*3/uL (ref 0.1–1.0)
Monocytes Relative: 6 %
Neutro Abs: 6.7 10*3/uL (ref 1.7–7.7)
Neutrophils Relative %: 71 %
Platelets: 191 10*3/uL (ref 150–400)
RBC: 5.24 MIL/uL (ref 4.22–5.81)
RDW: 13.8 % (ref 11.5–15.5)
WBC: 9.4 10*3/uL (ref 4.0–10.5)
nRBC: 0 % (ref 0.0–0.2)

## 2021-07-24 LAB — CEA (IN HOUSE-CHCC): CEA (CHCC-In House): 1.46 ng/mL (ref 0.00–5.00)

## 2021-07-24 MED ORDER — SODIUM CHLORIDE 0.9% FLUSH
10.0000 mL | INTRAVENOUS | Status: DC | PRN
  Administered 2021-07-24: 10 mL
  Filled 2021-07-24: qty 10

## 2021-07-24 MED ORDER — HEPARIN SOD (PORK) LOCK FLUSH 100 UNIT/ML IV SOLN
500.0000 [IU] | Freq: Once | INTRAVENOUS | Status: AC | PRN
  Administered 2021-07-24: 500 [IU]
  Filled 2021-07-24: qty 5

## 2021-07-28 ENCOUNTER — Telehealth: Payer: Self-pay | Admitting: Physician Assistant

## 2021-07-28 NOTE — Telephone Encounter (Signed)
Scheduled per los. Mailed printout  °

## 2021-07-29 ENCOUNTER — Ambulatory Visit: Payer: Medicaid Other | Admitting: Nurse Practitioner

## 2021-07-31 ENCOUNTER — Ambulatory Visit: Payer: Medicaid Other | Admitting: Family Medicine

## 2021-08-05 ENCOUNTER — Other Ambulatory Visit: Payer: Medicaid Other

## 2021-08-05 ENCOUNTER — Other Ambulatory Visit: Payer: Self-pay

## 2021-08-05 ENCOUNTER — Ambulatory Visit
Admission: RE | Admit: 2021-08-05 | Discharge: 2021-08-05 | Disposition: A | Discharge status: Home / Self Care | Payer: Medicaid Other | Source: Ambulatory Visit | Attending: Family Medicine | Admitting: Family Medicine

## 2021-08-05 ENCOUNTER — Ambulatory Visit
Admission: RE | Admit: 2021-08-05 | Discharge: 2021-08-05 | Disposition: A | Discharge status: Home / Self Care | Payer: Medicaid Other | Source: Ambulatory Visit | Attending: Physician Assistant | Admitting: Physician Assistant

## 2021-08-05 DIAGNOSIS — R911 Solitary pulmonary nodule: Secondary | ICD-10-CM | POA: Diagnosis not present

## 2021-08-05 DIAGNOSIS — J439 Emphysema, unspecified: Secondary | ICD-10-CM | POA: Diagnosis not present

## 2021-08-05 DIAGNOSIS — R918 Other nonspecific abnormal finding of lung field: Secondary | ICD-10-CM

## 2021-08-05 DIAGNOSIS — K760 Fatty (change of) liver, not elsewhere classified: Secondary | ICD-10-CM | POA: Diagnosis not present

## 2021-08-05 DIAGNOSIS — K8020 Calculus of gallbladder without cholecystitis without obstruction: Secondary | ICD-10-CM | POA: Diagnosis not present

## 2021-08-05 DIAGNOSIS — C20 Malignant neoplasm of rectum: Secondary | ICD-10-CM

## 2021-08-05 DIAGNOSIS — K458 Other specified abdominal hernia without obstruction or gangrene: Secondary | ICD-10-CM

## 2021-08-06 ENCOUNTER — Ambulatory Visit: Payer: Medicaid Other | Admitting: Hematology

## 2021-08-06 ENCOUNTER — Telehealth: Payer: Self-pay

## 2021-08-06 NOTE — Telephone Encounter (Signed)
Patient's significant other [name]) calls nurse line requesting to speak with provider regarding results of CT abdomen scan performed yesterday.   Please return call to [facility] at [phone].   Talbot Grumbling, RN

## 2021-08-07 ENCOUNTER — Encounter: Payer: Self-pay | Admitting: Hematology

## 2021-08-07 ENCOUNTER — Inpatient Hospital Stay (HOSPITAL_BASED_OUTPATIENT_CLINIC_OR_DEPARTMENT_OTHER): Payer: Medicaid Other | Admitting: Hematology

## 2021-08-07 ENCOUNTER — Telehealth: Payer: Self-pay

## 2021-08-07 ENCOUNTER — Other Ambulatory Visit: Payer: Self-pay

## 2021-08-07 DIAGNOSIS — C20 Malignant neoplasm of rectum: Secondary | ICD-10-CM | POA: Diagnosis not present

## 2021-08-07 DIAGNOSIS — Z1509 Genetic susceptibility to other malignant neoplasm: Secondary | ICD-10-CM | POA: Diagnosis not present

## 2021-08-07 NOTE — Telephone Encounter (Signed)
This nurse spoke with patients significant other [name], request to make office visit a phone visit per Dr. Burr Medico.  Patient and significant other are in agreement.  No further questions or concerns at this time.

## 2021-08-07 NOTE — Progress Notes (Signed)
[place]   [phone] Fax:([phone]) [phone]   Clinic Follow up Note   Patient Care Team: Alcus Dad, MD as PCP - General (Family Medicine) Michael Boston, MD as Consulting Physician (General Surgery) [place], Kirke Corin, MD as Consulting Physician (Gastroenterology) Kyung Rudd, MD as Consulting Physician (Radiation Oncology) Truitt Merle, MD as Consulting Physician (Oncology) Margaretha Seeds, MD as Consulting Physician (Pulmonary Disease)  Date of Service:  08/07/2021  CHIEF COMPLAINT: f/u of rectal cancer  SUMMARY OF ONCOLOGIC HISTORY: Oncology History Overview Note  Rectal adenocarcinoma [name]   Staging form: Colon and Rectum, AJCC 7th Edition   - Clinical stage from 07/28/2016: Stage IIIB (T3, N2a, M0) - Signed by Truitt Merle, MD on 08/06/2016    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  07/26/2016 Imaging   CT chest, abdomen and pelvis with contrast showed nodular thickening of the rectal wall, enlarged perirectal lymph nodes. Diverticulosis. Nonspecific low-level lymphadenopathy. Lower lobe emphysema, small 3-4 mm left lower lobe lung nodules.   07/28/2016 Initial Diagnosis   Rectal adenocarcinoma ([name])   07/28/2016 Initial Biopsy   Rectal mass biopsy showed invasive adenocarcinoma, polyps in the ascending colon and rectum showed tubular adenoma    07/28/2016 Procedure   Colonoscopy showed a nearly circumferential mass in the distal rectum, 4 polyps in the descending colon, diverticulosis in the left colon.    07/29/2016 Procedure   Low EUS showed clearly malignant 6 cm long, nearly circumferential mass in the distal rectum, with distal edge located to 1 cm from the annual approach, multiple prior rectal lymph nodes (6) are suspicious ,uT3 N2    08/17/2016 - 09/24/2016 Radiation Therapy   Neoadjuvant radiation to his rectal cancer 1) Rectum/ 45 Gy in 25 fx                      2) Boost/ 5.4 Gy in 3 fx     08/17/2016 - 09/24/2016  Chemotherapy   Xeloda 1500 mg twice daily with concurrent irradiation   08/18/2016 Imaging   PET scan showed hypermetabolic rectal mass, perirectal node 50mm with mild increased uptake, no hypermetabolic distant metastasis, small pulmonary nodules are too small to characterize by PET.    11/29/2016 Imaging   CT of the C/A/P W Contrast IMPRESSION: 1. Interval treatment changes within the perirectal fat with otherwise stable rectosigmoid colon wall thickening. 2. No progressive adenopathy or distant metastases identified. 3. Stable bilateral pulmonary nodules. Short-term stability (for 4 months) is reassuring, although the nodules are too small to optimally characterize by previous PET-CT. Continued CT follow-up recommended.   12/01/2016 Surgery   Colon total resection and proctocolectomy for rectal cancer and Lynch syndrome    12/01/2016 Pathology Results   Proctocolectomy showed scattered small residual foci of invasive moderately differentiated adenocarcinoma embedded in submucosa tissue with extensive neoadjuvant effect, 2.5 cm in greatest time mention, adenocarcinoma invades through muscularis mucosa and involves subserosal soft tissue, margins are negative, 2 of 35 lymph nodes are positive for metastatic adenocarcinoma, 2 calcified and necrotic soft tissue nodules are also present without visible tumor seen.   01/07/2017 - 05/26/2017 Chemotherapy   Adjuvant chemo CAPOX, changed to FOLFOX from cycle 2 due to poor tolerance, a total of 4.5 months therapy     01/26/2017 Surgery   PLACEMENT OF PORT-A-CATH CENTRAL LINE WITH FLUOROSCOPY AND ULTRASOUND   06/21/2017 Imaging   CT Chest w contrast 06/21/2017 IMPRESSION: 1. No evidence of metastatic disease. 2.  Emphysema (ICD10-J43.9). 3. Scattered  subpleural pulmonary nodules measure 5 mm or less in size, stable and likely subpleural lymph nodes.   09/21/2017 Procedure   Pouchoscopy by Dr. Marcello Moores on 09/21/17 IMPRESSION  - The examined portion  of the ileum was normal. - No specimens collected.   09/22/2017 Surgery   TAKEDOWN OF LOOP ILEOSTOMY by Dr. Johney Maine on 09/22/17  Diagnosis 09/22/17  Ileostomy, loop SKIN AND COLONIC MUCOSA NEGATIVE FOR CARCINOMA   12/06/2017 Imaging   CT CAP W Contrast 12/06/17 IMPRESSION: 1. Postoperative changes of total proctocolectomy and ileoanal anastomosis without evidence of metastatic disease. Ileoanal anastomosis appears tethered to the sacrum by presacral thickening. 2.  Emphysema (ICD10-J43.9).   01/12/2018 Procedure   Pouchoscopy by Dr. Marcello Moores 01/12/18 - Ileoanal anastomotic stricture found on perianal exam. - No specimens collected. - Erythematous mucosa in the ileoanal pouch. Biopsied.   Pathology results  Diagnosis 01/12/18 Rectum, biopsy, J Pouch mucosa - SMALL BOWEL TYPE MUCOSA WITH CHRONIC INFLAMMATION - NO GRANULOMATA, DYSPLASIA, OR MALIGNANCY IDENTIFIED   06/25/2019 Imaging   CT CAP IMPRESSION: 1. Low anterior section and permanent right sided ileostomy, without evidence of recurrent or metastatic disease. Probable postoperative seroma in the presacral space, stable. 2. Mild hepatomegaly. 3. Cholelithiasis. 4.  Emphysema (ICD10-J43.9).   06/19/2020 Imaging   CT Chest  IMPRESSION: 1. Interval development of multiple small nodules in the lungs compared with 1 year ago. Given history of rectal cancer, metastatic disease is suspected. Most these nodules are very small and continued close follow-up is warranted. 2. Moderate emphysema   07/07/2020 Imaging   CT AP w contrast  IMPRESSION: 1. No evidence of metastatic disease in the abdomen or pelvis. Pulmonary nodules were better seen on dedicated CT chest 06/19/2020. 2. Hepatic steatosis. 3. Cholelithiasis. 4.  Emphysema (ICD10-J43.9).   09/08/2020 Imaging   CT chest  IMPRESSION: 1. Fluctuating bilateral pulmonary nodularity. Although several of the right lung nodule seen on the most recent study have improved/resolved,  there are at least 2 new nodules in the right lower and left upper lobes. The fluctuation suggests a possible inflammatory etiology, although metastatic disease cannot be excluded. Continued CT follow-up in 3-6 months recommended. 2. Stable small mediastinal lymph nodes, likely reactive. 3. Hepatic steatosis. 4. Right IJ Port-A-Cath tip is located inferior medially in the right atrium near the tricuspid valve, unchanged. 5. Emphysema (ICD10-J43.9).        CURRENT THERAPY:  Surveillance  INTERVAL HISTORY:  [name] was contacted for a virtual visit to discuss his recent CT scan findings he was last seen by me on 09/18/20 and by PA Cassie on 07/24/21. His wife also participated in our conversation.  He is clinically stable, still has mild dyspnea on exertion, no new complains.    All other systems were reviewed with the patient and are negative.  MEDICAL HISTORY:  Past Medical History:  Diagnosis Date   Anxiety    C. 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 In families with MSH6 mutations, screening can start at age 20 years since the onset of colon cancer  is later in these families.  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    SURGICAL HISTORY: Past Surgical History:  Procedure Laterality Date   COLON SURGERY     EUS N/A 07/29/2016    Procedure: LOWER ENDOSCOPIC ULTRASOUND (EUS);  Surgeon: Milus Banister, MD;  Location: [facility];  Service: Endoscopy;  Laterality: N/A;   ILEOSTOMY CLOSURE N/A 09/22/2017   Procedure: TAKEDOWN OF LOOP ILEOSTOMY;  Surgeon: Michael Boston, MD;  Location: [facility];  Service: General;  Laterality: N/A;   LYSIS OF ADHESION N/A 05/10/2018   Procedure: LYSIS OF ADHESION;  Surgeon: Michael Boston, MD;  Location: [facility];  Service: General;  Laterality: N/A;   PORTACATH PLACEMENT N/A 01/26/2017   Procedure: PLACEMENT OF PORT-A-CATH CENTRAL LINE WITH FLUOROSCOPY AND ULTRASOUND;  Surgeon: Michael Boston, MD;  Location: [facility];  Service: General;  Laterality: N/A;   POUCHOSCOPY N/A 09/21/2017   Procedure: POUCHOSCOPY;  Surgeon: Leighton Ruff, MD;  Location: [facility];  Service: Endoscopy;  Laterality: N/A;   POUCHOSCOPY N/A 01/12/2018   Procedure: POUCHOSCOPY WITH BIOPSIES;  Surgeon: Leighton Ruff, MD;  Location: [facility];  Service: Endoscopy;  Laterality: N/A;  Local   PROCTOSCOPY N/A 12/01/2016   Procedure: RIGID PROCTOSCOPY;  Surgeon: Michael Boston, MD;  Location: [facility];  Service: General;  Laterality: N/A;   RECTAL EXAM UNDER ANESTHESIA N/A 05/10/2018   Procedure: RECTAL EXAM UNDER ANESTHESIA;  Surgeon: Michael Boston, MD;  Location: [facility];  Service: General;  Laterality: N/A;   TOE AMPUTATION Left    XI ROBOTIC ASSISTED LOWER ANTERIOR RESECTION N/A 12/01/2016   Procedure: XI ROBOTIC ASSISTED PROCTOCOLECTOMY WITH ILLEOPOUCH ANASTAMOSIS WITH DIVERTING ILLEOSTOMY;  Surgeon: Michael Boston, MD;  Location: [facility];  Service: General;  Laterality: N/A;   XI ROBOTIC ASSISTED LOWER ANTERIOR RESECTION N/A 05/10/2018   Procedure: XI ROBOTIC RESECTION OF ILEAL J POUCH, LYSIS OF ADHESIONS, WITH CREATION OF PERMANENT END ILEOSTOMY.;  Surgeon: Michael Boston, MD;  Location: [facility];  Service: General;  Laterality: N/A;    I have reviewed the social history and family history with the patient and they  are unchanged from previous note.  ALLERGIES:  has No Known Allergies.  MEDICATIONS:  Current Outpatient Medications  Medication Sig Dispense Refill   Accu-Chek Softclix Lancets lancets Use as instructed 100 each 12   albuterol (PROVENTIL) (2.5 MG/3ML) 0.083% nebulizer solution Take 3 mLs (2.5 mg total) by nebulization every 6 (six) hours as needed for wheezing or shortness of breath. 75 mL 0   albuterol (VENTOLIN HFA) 108 (90 Base) MCG/ACT inhaler Inhale 2 puffs into the lungs every 4 (four) hours as needed for wheezing or shortness of breath. 18 g 2   atorvastatin (LIPITOR) 20 MG tablet Take 1 tablet (20 mg total) by mouth daily. 90 tablet 3   budesonide-formoterol (SYMBICORT) 160-4.5 MCG/ACT inhaler Inhale 2 puffs into the lungs in the morning and at bedtime. 1 each 12   gabapentin (NEURONTIN) 300 MG capsule TAKE 2 CAPSULES (600 MG TOTAL) BY MOUTH 3 (THREE) TIMES DAILY. 180 capsule 6   glucose blood (ACCU-CHEK GUIDE) test strip Use as instructed 100 each 12   hydrOXYzine (ATARAX/VISTARIL) 10 MG tablet TAKE 1/2 TABLET (5 MG TOTAL) BY MOUTH AT BEDTIME AS NEEDED. 45 tablet 1   metFORMIN (GLUCOPHAGE) 500 MG tablet  TAKE 1 TABLET BY MOUTH EVERY MORNING AND AT BEDTIME 180 tablet 1   sertraline (ZOLOFT) 100 MG tablet TAKE 1 TABLET BY MOUTH EVERY DAY 90 tablet 3   SPIRIVA RESPIMAT 2.5 MCG/ACT AERS INHALE 2 PUFFS BY MOUTH INTO THE LUNGS DAILY 4 g 1   No current facility-administered medications for this visit.   Facility-Administered Medications Ordered in Other Visits  Medication Dose Route Frequency Provider Last Rate Last Admin   sodium chloride flush (NS) 0.9 % injection 10 mL  10 mL Intravenous PRN Truitt Merle, MD   10 mL at 11/18/17 0953    PHYSICAL EXAMINATION: ECOG PERFORMANCE STATUS: 1 - Symptomatic but completely ambulatory  There were no vitals filed for this visit. Wt Readings from Last 3 Encounters:  07/24/21 173 lb 12.8 oz (78.8 kg)  07/23/21 173 lb 9.6 oz (78.7 kg)  07/15/21  173 lb (78.5 kg)     LABORATORY DATA:  I have reviewed the data as listed CBC Latest Ref Rng & Units 07/24/2021 07/01/2021 07/01/2021  WBC 4.0 - 10.5 K/uL 9.4 7.7 8.2  Hemoglobin 13.0 - 17.0 g/dL 15.5 16.5 16.4  Hematocrit 39.0 - 52.0 % 45.8 49.0 50.8  Platelets 150 - 400 K/uL 191 218 216     CMP Latest Ref Rng & Units 07/24/2021 07/01/2021 07/01/2021  Glucose 70 - 99 mg/dL 200(H) 212(H) 275(H)  BUN 6 - 20 mg/dL $Remove'9 12 15  'DmmqFLN$ Creatinine 0.61 - 1.24 mg/dL 0.94 0.87 0.89  Sodium 135 - 145 mmol/L 139 138 138  Potassium 3.5 - 5.1 mmol/L 4.1 4.1 4.6  Chloride 98 - 111 mmol/L 108 106 102  CO2 22 - 32 mmol/L $RemoveB'24 25 21  'lviiwsEF$ Calcium 8.9 - 10.3 mg/dL 8.6(L) 8.9 8.6(L)  Total Protein 6.5 - 8.1 g/dL 5.5(L) 6.3(L) 5.3(L)  Total Bilirubin 0.3 - 1.2 mg/dL 0.5 0.6 0.2  Alkaline Phos 38 - 126 U/L 74 65 76  AST 15 - 41 U/L $Remo'25 25 21  'hfZjx$ ALT 0 - 44 U/L 33 30 28      RADIOGRAPHIC STUDIES: I have personally reviewed the radiological images as listed and agreed with the findings in the report. No results found.   ASSESSMENT & PLAN:  [name] is a 50 y.o. male with   1. 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 He was treated with neoadjuvant concurrent chemoRT, colon surgery, adjuvant chemo and ileostomy reversal.  -He had ileostomy placed again due to uncontrolled diarrhea. It's manageable now. -His 06/26/20 GuardantReveal Testing was negative for circulating tumor cells -Since early 2021 he had progressive and worsened cough. CT Chest from 06/19/20 shows Interval development of multiple small nodules in the lungs compared with 1 year ago.  -His pulmonary symptoms has improved with COPD treatment and temporary quitting smoking -His recent CT scan from 07/01/2021 and 08/05/2021 showed no evidence of cancer recurrence.  I personally reviewed his CT scan and discussed with patient -He has completed 5 years of surveillance,  risk of recurrence is minimal now -Follow-up in 1 year for last visit   2. Multiple lung nodules, dyspnea on exertion, Cough, Emphysema   -He has a history of smoking and is currently still smokes 1ppd. He has known cough which he attributed to smoking. He previously declined nicotine patched, but still working on quitting.   -He had progressive SOB and cough in early 2021. CT chest on 06/19/20 showed interval development of multiple small b/l  pulmonary nodules compared to prior year. There is concern for metastatic disease. Scan also shows moderate emphysema.  -F/u with [place] [facility] pulmonary Dr. Loanne Drilling  -His symptoms have improved on Spiriva. He has not needed nebulizer recently. -His recent CT scan from July 27, 2016 2022 showed interval resolution of the dominant 7 mm left upper lobe pulmonary nodule, suggesting inflammatory etiology.  Other small nodules are stable. -Follow-up with pulmonary   3. 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    4. Anxiety, Depression, Social issues  -His family member previously noted he has been nervous about cancer recurrence. He notes smoking helps him but understands he should quit.  -He is out of work, on disability      Plan  -CT from 08/05/2021 reviewed, no evidence of cancer recurrence. -Lab and follow-up in 1 year for last visit.  No problem-specific Assessment & Plan notes found for this encounter.   No orders of the defined types were placed in this encounter.  All questions were answered. The patient knows to call the clinic with any problems, questions or concerns. No barriers to learning was detected. The total time spent in the appointment was 22 minutes.     Truitt Merle, MD 08/07/2021   I, Wilburn Mylar, am acting as scribe for Truitt Merle, MD.    I have reviewed the above documentation for accuracy and completeness, and I agree with the above.

## 2021-08-10 NOTE — Telephone Encounter (Signed)
Returned [employer]. She had already discussed the CT results with his oncologist, but had questions about the finding of fatty liver. I informed her that this is a fairly common finding and often the result of dietary habits. He has an upcoming appointment with me in 2 weeks so we can discuss further at that time. No immediate intervention needed. She was also inquiring about a continuous glucose monitor so he doesn't need to prick his fingers multiple times per day. Will discuss with pharmacy team prior to his upcoming appointment.

## 2021-08-18 ENCOUNTER — Other Ambulatory Visit: Payer: Self-pay | Admitting: Obstetrics and Gynecology

## 2021-08-18 NOTE — Patient Outreach (Signed)
Care Coordination  08/18/2021  [name] Jul 25, 1971 [id number]   Medicaid Managed Care   Unsuccessful Outreach Note  08/18/2021 Name: [name] MRN: [id number] DOB: May 02, 1971  Referred by: Alcus Dad, MD Reason for referral : High Risk Managed Medicaid (Unsuccessful telephone outreach)   An unsuccessful telephone outreach was attempted today. The patient was referred to the case management team for assistance with care management and care coordination.   Follow Up Plan: The care management team will reach out to the patient again over the next 7 days.   Aida Raider RN, BSN [address]  [facility] - Managed Medicaid High Risk [phone].

## 2021-08-18 NOTE — Patient Instructions (Signed)
Hi [name] we missed you today - as a part of your Medicaid benefit, you are eligible for care management and care coordination services at no cost or copay. I was unable to reach you by phone today but would be happy to help you with your health related needs. Please feel free to call me at [phone].  A member of the Managed Medicaid care management team will reach out to you again over the next 7 days.   Aida Raider RN, BSN [place]  [facility] - Managed Medicaid High Risk [phone].

## 2021-08-19 DIAGNOSIS — Z933 Colostomy status: Secondary | ICD-10-CM | POA: Diagnosis not present

## 2021-08-19 DIAGNOSIS — Z433 Encounter for attention to colostomy: Secondary | ICD-10-CM | POA: Diagnosis not present

## 2021-08-25 ENCOUNTER — Ambulatory Visit (INDEPENDENT_AMBULATORY_CARE_PROVIDER_SITE_OTHER): Payer: Medicaid Other | Admitting: Family Medicine

## 2021-08-25 ENCOUNTER — Encounter: Payer: Self-pay | Admitting: Family Medicine

## 2021-08-25 ENCOUNTER — Other Ambulatory Visit: Payer: Self-pay

## 2021-08-25 VITALS — BP 101/74 | HR 63 | Ht 71.0 in | Wt 169.8 lb

## 2021-08-25 DIAGNOSIS — Z720 Tobacco use: Secondary | ICD-10-CM | POA: Diagnosis not present

## 2021-08-25 DIAGNOSIS — E1165 Type 2 diabetes mellitus with hyperglycemia: Secondary | ICD-10-CM | POA: Diagnosis present

## 2021-08-25 DIAGNOSIS — J441 Chronic obstructive pulmonary disease with (acute) exacerbation: Secondary | ICD-10-CM

## 2021-08-25 DIAGNOSIS — R0689 Other abnormalities of breathing: Secondary | ICD-10-CM

## 2021-08-25 MED ORDER — ALBUTEROL SULFATE HFA 108 (90 BASE) MCG/ACT IN AERS: 2.0000 | INHALATION_SPRAY | RESPIRATORY_TRACT | 2 refills | Status: DC | PRN

## 2021-08-25 NOTE — Patient Instructions (Addendum)
It was great to see you!  Things we discussed at today's visit: - I'm glad to see your sugars are improving. They are still above goal. - We will increase your Metformin to '1000mg'$  in the morning and '500mg'$  at night for 2 weeks. If you're doing well with this dose, you should then increase to '1000mg'$  twice daily. - Continue watching your diet (minimizing sweets, sweetened beverages, added sugars, and large portions of carbohydrates). - Follow up in 1-2 months to recheck your A1c - The single best thing you can do for your health is to quit smoking. If you'd like help with this, please reach out to our office.    Take care and seek immediate care sooner if you develop any concerns.  Dr. [facility]

## 2021-08-25 NOTE — Progress Notes (Signed)
    SUBJECTIVE:   CHIEF COMPLAINT / HPI:   Type 2 Diabetes Patient here to follow up on his diabetes. Last seen 1 month ago with concern that all his home glucose readings were above 200 (some above 300). At that time his Metformin was increased from '500mg'$  once daily to '500mg'$  BID.  Returns today- reports he is tolerating the Metformin '500mg'$  BID without issue. Checking his sugar once per day. Brought his written log. Range 109-256. Fasting mostly mid to high 100s. He has improved his dietary compliance. Cut out sodas (previously drinking >1 regular soda per day).  Tobacco Use Currently smoking 3/4 PPD. Previously smoked 1.5 PPD. Moderately interested in cutting back but not fully committed. States it's his crutch, he has increased stress and anxiety some of which is related to his daughter's current pregnancy. Previously quit for 1.5 years when he was diagnosed with his rectal adenocarcinoma 5 years ago. With regard to quitting he states "if I'm gonna do it, I'm gonna do it on my own"  PERTINENT  PMH / PSH: Lynch syndrome, rectal adenocarcinoma s/p proctocolectomy, COPD, T2DM  OBJECTIVE:   BP 101/74   Pulse 63   Ht '5\' 11"'$  (1.803 m)   Wt 169 lb 12.8 oz (77 kg)   SpO2 99%   BMI 23.68 kg/m   General: NAD, pleasant, able to participate in exam Respiratory: No respiratory distress Skin: warm and dry, no rashes noted Psych: Normal affect and mood Neuro: grossly intact  ASSESSMENT/PLAN:   Type 2 diabetes mellitus with hyperglycemia, without long-term current use of insulin (HCC) Improved glycemic control compared to last visit 1 month ago. Still with majority of home glucose readings above goal. Will increase Metformin to '1000mg'$  BID (Starting with '1000mg'$  qAM and '500mg'$  qHS for 2 weeks, then increase if tolerating). Last  A1c 8.4% in July 2022. Return in 1 month for repeat A1c, urine microalbumin, foot exam, etc.  Tobacco abuse Patient provided smoking cessation counseling. He is only  partially committed to cutting back/quitting at this time. Offered pharmacy referral, patch, gum, medications etc but he declines assistance. Will continue to address at every visit.     Alcus Dad, MD [place]

## 2021-08-26 ENCOUNTER — Other Ambulatory Visit: Payer: Self-pay | Admitting: Student

## 2021-08-26 NOTE — Assessment & Plan Note (Signed)
Patient provided smoking cessation counseling. He is only partially committed to cutting back/quitting at this time. Offered pharmacy referral, patch, gum, medications etc but he declines assistance. Will continue to address at every visit.

## 2021-08-26 NOTE — Assessment & Plan Note (Signed)
Improved glycemic control compared to last visit 1 month ago. Still with majority of home glucose readings above goal. Will increase Metformin to '1000mg'$  BID (Starting with '1000mg'$  qAM and '500mg'$  qHS for 2 weeks, then increase if tolerating). Last  A1c 8.4% in July 2022. Return in 1 month for repeat A1c, urine microalbumin, foot exam, etc.

## 2021-08-26 NOTE — H&P (Signed)
Chief Complaint: Patient was seen in consultation today for rectal adenocarcinoma  Referring Physician(s): Heilingoetter,Cassandra L  Supervising Physician: Aletta Edouard  Patient Status: [facility] - Out-pt  History of Present Illness: [name] is a 50 y.o. male with past medical history of COPD, Lynch syndrome, rectal adenocarcinoma s/p 5 years of treatment and surveillance with clinical remission presents for Port-A-Cath removal. Port was originally placed by Dr. Johney Maine.   [name] presents today in his usual state of health. He has a cough typical for him due to history of COPD.  He has been NPO.  Does not take blood thinners.   Past Medical History:  Diagnosis Date   Anxiety    C. difficile diarrhea 03/30/2018   Colon cancer ([place]) 12/01/2016   COPD (chronic obstructive pulmonary disease) (HCC)    Depression    Family history of colon cancer    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 10/13/2016   MSH6 pathogenic mutation  Lynch syndrome screening recs (from up to date)  Annual colonoscopy starting between the ages of 57 and 80 years, or 10 years prior to the earliest age of colon cancer diagnosis in the family (whichever comes first). In families with MSH6 mutations, screening can start at age 24 years since the onset of colon cancer is later in these families.  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    Past Surgical History:  Procedure Laterality Date   COLON SURGERY     EUS N/A 07/29/2016   Procedure: LOWER ENDOSCOPIC ULTRASOUND (EUS);  Surgeon: Milus Banister, MD;  Location: [facility];  Service: Endoscopy;  Laterality: N/A;   ILEOSTOMY CLOSURE N/A 09/22/2017   Procedure: TAKEDOWN OF LOOP ILEOSTOMY;  Surgeon: Michael Boston, MD;  Location:  [facility];  Service: General;  Laterality: N/A;   LYSIS OF ADHESION N/A 05/10/2018   Procedure: LYSIS OF ADHESION;  Surgeon: Michael Boston, MD;  Location: [facility];  Service: General;  Laterality: N/A;   PORTACATH PLACEMENT N/A 01/26/2017   Procedure: PLACEMENT OF PORT-A-CATH CENTRAL LINE WITH FLUOROSCOPY AND ULTRASOUND;  Surgeon: Michael Boston, MD;  Location: [facility];  Service: General;  Laterality: N/A;   POUCHOSCOPY N/A 09/21/2017   Procedure: POUCHOSCOPY;  Surgeon: Leighton Ruff, MD;  Location: [facility];  Service: Endoscopy;  Laterality: N/A;   POUCHOSCOPY N/A 01/12/2018   Procedure: POUCHOSCOPY WITH BIOPSIES;  Surgeon: Leighton Ruff, MD;  Location: [facility];  Service: Endoscopy;  Laterality: N/A;  Local   PROCTOSCOPY N/A 12/01/2016   Procedure: RIGID PROCTOSCOPY;  Surgeon: Michael Boston, MD;  Location: [facility];  Service: General;  Laterality: N/A;   RECTAL EXAM UNDER ANESTHESIA N/A 05/10/2018   Procedure: RECTAL EXAM UNDER ANESTHESIA;  Surgeon: Michael Boston, MD;  Location: [facility];  Service: General;  Laterality: N/A;   TOE AMPUTATION Left    XI ROBOTIC ASSISTED LOWER ANTERIOR RESECTION N/A 12/01/2016   Procedure: XI ROBOTIC ASSISTED PROCTOCOLECTOMY WITH ILLEOPOUCH ANASTAMOSIS WITH DIVERTING ILLEOSTOMY;  Surgeon: Michael Boston, MD;  Location: [facility];  Service: General;  Laterality: N/A;   XI ROBOTIC ASSISTED LOWER ANTERIOR RESECTION N/A 05/10/2018   Procedure: XI ROBOTIC RESECTION OF ILEAL J POUCH, LYSIS OF ADHESIONS, WITH CREATION OF PERMANENT END ILEOSTOMY.;  Surgeon: Michael Boston, MD;  Location: [facility];  Service: General;  Laterality: N/A;  Allergies: Patient has no known allergies.  Medications: Prior to Admission medications   Medication Sig Start Date End Date Taking? Authorizing Provider  Accu-Chek Softclix Lancets lancets Use as instructed 09/25/20   Meccariello, Bernita Raisin, DO  albuterol (PROVENTIL) (2.5 MG/3ML) 0.083% nebulizer solution Take 3 mLs (2.5 mg total)  by nebulization every 6 (six) hours as needed for wheezing or shortness of breath. 07/15/21   Simmons-Robinson, Riki Sheer, MD  albuterol (VENTOLIN HFA) 108 (90 Base) MCG/ACT inhaler Inhale 2 puffs into the lungs every 4 (four) hours as needed for wheezing or shortness of breath. 08/25/21   Alcus Dad, MD  atorvastatin (LIPITOR) 20 MG tablet Take 1 tablet (20 mg total) by mouth daily. 10/24/20   Meccariello, Bernita Raisin, DO  budesonide-formoterol (SYMBICORT) 160-4.5 MCG/ACT inhaler Inhale 2 puffs into the lungs in the morning and at bedtime. 08/14/20   Margaretha Seeds, MD  gabapentin (NEURONTIN) 300 MG capsule TAKE 2 CAPSULES (600 MG TOTAL) BY MOUTH 3 (THREE) TIMES DAILY. 06/09/21   Meccariello, Bernita Raisin, DO  glucose blood (ACCU-CHEK GUIDE) test strip Use as instructed 09/25/20   Meccariello, Bernita Raisin, DO  hydrOXYzine (ATARAX/VISTARIL) 10 MG tablet TAKE 1/2 TABLET (5 MG TOTAL) BY MOUTH AT BEDTIME AS NEEDED. 07/24/21   Alcus Dad, MD  metFORMIN (GLUCOPHAGE) 500 MG tablet TAKE 1 TABLET BY MOUTH EVERY MORNING AND AT BEDTIME 05/05/21   Meccariello, Bernita Raisin, DO  sertraline (ZOLOFT) 100 MG tablet TAKE 1 TABLET BY MOUTH EVERY DAY 06/09/21   Meccariello, Bernita Raisin, DO  SPIRIVA RESPIMAT 2.5 MCG/ACT AERS INHALE 2 PUFFS BY MOUTH INTO THE LUNGS DAILY 07/03/21   Alcus Dad, MD     Family History  Problem Relation Age of Onset   Diabetes Mother    COPD Father    Colon cancer Maternal Aunt        dx in her 54s   Diabetes Maternal Grandmother    Brain cancer Maternal Grandfather    Lung cancer Paternal Grandfather    Colon cancer Cousin        dx in her 23s   Bone cancer Sister 8   Pancreatic cancer Neg Hx    Esophageal cancer Neg Hx    Stomach cancer Neg Hx    Liver disease Neg Hx     Social History   Socioeconomic History   Marital status: Divorced    Spouse name: Not on file   Number of children: 3   Years of education: Not on file   Highest education level: Not on file  Occupational History    Not on file  Tobacco Use   Smoking status: Every Day    Packs/day: 1.00    Years: 33.00    Pack years: 33.00    Types: Cigarettes    Start date: 12/20/1985   Smokeless tobacco: Never   Tobacco comments:    smokes 1 pack/day- 08/14/2020  Vaping Use   Vaping Use: Never used  Substance and Sexual Activity   Alcohol use: No   Drug use: No    Comment: patient denies   Sexual activity: Yes  Other Topics Concern   Not on file  Social History Narrative   Lives with significant other, [name]   Have a 36 year old son   Smoker      [place]   Social Determinants of Health   Financial Resource Strain: Not on file  Food Insecurity: Not on file  Transportation Needs: Not on file  Physical Activity: Not on file  Stress: Not on file  Social Connections: Not on file     Review of Systems: A 12 point ROS discussed and pertinent positives are indicated in the HPI above.  All other systems are negative.  Review of Systems  Constitutional:  Negative for fatigue and fever.  Respiratory:  Negative for cough and shortness of breath.   Cardiovascular:  Negative for chest pain.  Gastrointestinal:  Negative for abdominal pain and nausea.  Genitourinary:  Negative for dysuria.  Musculoskeletal:  Negative for back pain.  Psychiatric/Behavioral:  Negative for behavioral problems and confusion.    Vital Signs: BP 106/70   Pulse (!) 58   Temp 97.7 F (36.5 C) (Oral)   Resp 18   SpO2 99%   Physical Exam Vitals and nursing note reviewed.  Constitutional:      Appearance: Normal appearance.  HENT:     Mouth/Throat:     Mouth: Mucous membranes are moist.     Pharynx: Oropharynx is clear.  Neck:     Comments: Surgically placed Port-A-Cath in place.  Cardiovascular:     Rate and Rhythm: Normal rate and regular rhythm.  Pulmonary:     Effort: Pulmonary effort is normal. No respiratory distress.     Breath sounds: Normal breath sounds.  Musculoskeletal:      Cervical back: Normal range of motion and neck supple.  Skin:    General: Skin is warm and dry.  Neurological:     General: No focal deficit present.     Mental Status: He is alert and oriented to person, place, and time. Mental status is at baseline.  Psychiatric:        Mood and Affect: Mood normal.        Behavior: Behavior normal.        Thought Content: Thought content normal.        Judgment: Judgment normal.     MD Evaluation Airway: WNL Heart: WNL Abdomen: WNL Chest/ Lungs: WNL ASA  Classification: 3 Mallampati/Airway Score: Two   Imaging: CT Abdomen Pelvis Wo Contrast  Result Date: 08/06/2021 CLINICAL DATA:  Rectal cancer. Restaging. Pulmonary nodules and hernia. History of Lynch syndrome. EXAM: CT CHEST, ABDOMEN AND PELVIS WITHOUT CONTRAST TECHNIQUE: Multidetector CT imaging of the chest, abdomen and pelvis was performed following the standard protocol without IV contrast. COMPARISON:  Abdomen/pelvis CT 07/01/2021.  Chest CT 09/08/2020. FINDINGS: CT CHEST FINDINGS Cardiovascular: The heart size is normal. No substantial pericardial effusion. Right Port-A-Cath tip is positioned in the low right atrium near the tricuspid valve. Mediastinum/Nodes: No mediastinal lymphadenopathy. No evidence for gross hilar lymphadenopathy although assessment is limited by the lack of intravenous contrast on today's study. The esophagus has normal imaging features. There is no axillary lymphadenopathy. Lungs/Pleura: Centrilobular emphsyema noted. Scattered tiny bilateral pulmonary nodules again noted. More dominant lesion seen in the medial left upper lobe on 42/6, lateral left upper lobe on 39/6, and posterior right lower lobe on 53/6 are stable ranging in size from 3 mm to 5 mm. Dominant ill-defined 7 mm posterior left upper lobe pulmonary nodule seen on the previous study has resolved in the interval suggesting infectious/inflammatory etiology. No new suspicious pulmonary nodule or mass. No focal  airspace consolidation. Calcified granuloma again noted right upper lobe. Musculoskeletal: No worrisome lytic or sclerotic osseous abnormality. CT ABDOMEN PELVIS FINDINGS Hepatobiliary: No focal abnormality in the liver on this study without intravenous contrast. The liver shows diffusely decreased attenuation suggesting fat deposition. Layering tiny  calcified gallstones measure up to about 5 mm in size. Gallbladder is nondistended. No intrahepatic or extrahepatic biliary dilation. Pancreas: No focal mass lesion. No dilatation of the main duct. No intraparenchymal cyst. No peripancreatic edema. Spleen: No splenomegaly. No focal mass lesion. Adrenals/Urinary Tract: No adrenal nodule or mass. Kidneys unremarkable. No evidence for hydroureter. The urinary bladder appears normal for the degree of distention. Stomach/Bowel: Stomach is unremarkable. No gastric wall thickening. No evidence of outlet obstruction. Duodenum is normally positioned as is the ligament of Treitz. No small bowel wall thickening. No small bowel dilatation. Right abdominal ileostomy noted. Status post proctocolectomy. Presacral soft tissue is similar to prior consistent with treatment related change. Small collection of gas/debris in the presacral space is similar to prior measuring 2.5 x 1.3 cm today compared to 2.3 x 1.6 cm previously. Vascular/Lymphatic: No abdominal aortic aneurysm. No abdominal lymphadenopathy scattered upper normal lymph nodes in the central small bowel mesentery are stable in the interval. Reproductive: The prostate gland and seminal vesicles are unremarkable. Other: No intraperitoneal free fluid. Musculoskeletal: No worrisome lytic or sclerotic osseous abnormality. Bilateral pars interarticularis defects noted at L5. IMPRESSION: 1. Interval resolution of the dominant ill-defined 7 mm posterior left upper lobe pulmonary nodule seen on the previous study suggesting infectious/inflammatory etiology. Other scattered tiny  bilateral pulmonary nodules are stable in the interval. No new suspicious pulmonary nodule or mass. 2. Status post proctocolectomy with right abdominal ileostomy. No evidence for metastatic disease in the abdomen/pelvis. 3. Small collection of gas/debris in the presacral space is similar to prior with stable background presacral post treatment scarring. 4. Cholelithiasis. 5. Hepatic steatosis. 6. Emphysema (ICD10-J43.9). Electronically Signed   By: Misty Stanley M.D.   On: 08/06/2021 12:07   CT Chest Wo Contrast  Result Date: 08/06/2021 CLINICAL DATA:  Rectal cancer. Restaging. Pulmonary nodules and hernia. History of Lynch syndrome. EXAM: CT CHEST, ABDOMEN AND PELVIS WITHOUT CONTRAST TECHNIQUE: Multidetector CT imaging of the chest, abdomen and pelvis was performed following the standard protocol without IV contrast. COMPARISON:  Abdomen/pelvis CT 07/01/2021.  Chest CT 09/08/2020. FINDINGS: CT CHEST FINDINGS Cardiovascular: The heart size is normal. No substantial pericardial effusion. Right Port-A-Cath tip is positioned in the low right atrium near the tricuspid valve. Mediastinum/Nodes: No mediastinal lymphadenopathy. No evidence for gross hilar lymphadenopathy although assessment is limited by the lack of intravenous contrast on today's study. The esophagus has normal imaging features. There is no axillary lymphadenopathy. Lungs/Pleura: Centrilobular emphsyema noted. Scattered tiny bilateral pulmonary nodules again noted. More dominant lesion seen in the medial left upper lobe on 42/6, lateral left upper lobe on 39/6, and posterior right lower lobe on 53/6 are stable ranging in size from 3 mm to 5 mm. Dominant ill-defined 7 mm posterior left upper lobe pulmonary nodule seen on the previous study has resolved in the interval suggesting infectious/inflammatory etiology. No new suspicious pulmonary nodule or mass. No focal airspace consolidation. Calcified granuloma again noted right upper lobe.  Musculoskeletal: No worrisome lytic or sclerotic osseous abnormality. CT ABDOMEN PELVIS FINDINGS Hepatobiliary: No focal abnormality in the liver on this study without intravenous contrast. The liver shows diffusely decreased attenuation suggesting fat deposition. Layering tiny calcified gallstones measure up to about 5 mm in size. Gallbladder is nondistended. No intrahepatic or extrahepatic biliary dilation. Pancreas: No focal mass lesion. No dilatation of the main duct. No intraparenchymal cyst. No peripancreatic edema. Spleen: No splenomegaly. No focal mass lesion. Adrenals/Urinary Tract: No adrenal nodule or mass. Kidneys unremarkable. No evidence for hydroureter. The  urinary bladder appears normal for the degree of distention. Stomach/Bowel: Stomach is unremarkable. No gastric wall thickening. No evidence of outlet obstruction. Duodenum is normally positioned as is the ligament of Treitz. No small bowel wall thickening. No small bowel dilatation. Right abdominal ileostomy noted. Status post proctocolectomy. Presacral soft tissue is similar to prior consistent with treatment related change. Small collection of gas/debris in the presacral space is similar to prior measuring 2.5 x 1.3 cm today compared to 2.3 x 1.6 cm previously. Vascular/Lymphatic: No abdominal aortic aneurysm. No abdominal lymphadenopathy scattered upper normal lymph nodes in the central small bowel mesentery are stable in the interval. Reproductive: The prostate gland and seminal vesicles are unremarkable. Other: No intraperitoneal free fluid. Musculoskeletal: No worrisome lytic or sclerotic osseous abnormality. Bilateral pars interarticularis defects noted at L5. IMPRESSION: 1. Interval resolution of the dominant ill-defined 7 mm posterior left upper lobe pulmonary nodule seen on the previous study suggesting infectious/inflammatory etiology. Other scattered tiny bilateral pulmonary nodules are stable in the interval. No new suspicious  pulmonary nodule or mass. 2. Status post proctocolectomy with right abdominal ileostomy. No evidence for metastatic disease in the abdomen/pelvis. 3. Small collection of gas/debris in the presacral space is similar to prior with stable background presacral post treatment scarring. 4. Cholelithiasis. 5. Hepatic steatosis. 6. Emphysema (ICD10-J43.9). Electronically Signed   By: Misty Stanley M.D.   On: 08/06/2021 12:07    Labs:  CBC: Recent Labs    09/18/20 0854 07/01/21 1204 07/01/21 1401 07/01/21 1900 07/24/21 1004  WBC 9.6  --  8.2 7.7 9.4  HGB 15.5 16.1* 16.4 16.5 15.5  HCT 46.0  --  50.8 49.0 45.8  PLT 179  --  216 218 191    COAGS: No results for input(s): INR, APTT in the last 8760 hours.  BMP: Recent Labs    09/18/20 0854 10/24/20 0940 07/01/21 1401 07/01/21 1900 07/24/21 1004  NA 138 139 138 138 139  K 3.5 4.8 4.6 4.1 4.1  CL 104 106 102 106 108  CO2 25 18* $Remo'21 25 24  'kqWYk$ GLUCOSE 210* 240* 275* 212* 200*  BUN $Re'11 14 15 12 9  'WhP$ CALCIUM 9.1 9.1 8.6* 8.9 8.6*  CREATININE 0.92 0.95 0.89 0.87 0.94  GFRNONAA >60 93  --  >60 >60  GFRAA >60 107  --   --   --     LIVER FUNCTION TESTS: Recent Labs    09/18/20 0854 07/01/21 1401 07/01/21 1900 07/24/21 1004  BILITOT 0.3 0.2 0.6 0.5  AST $Re'30 21 25 25  'hEq$ ALT 42 28 30 33  ALKPHOS 62 76 65 74  PROT 5.8* 5.3* 6.3* 5.5*  ALBUMIN 2.8* 3.3* 3.1* 2.6*    TUMOR MARKERS: No results for input(s): AFPTM, CEA, CA199, CHROMGRNA in the last 8760 hours.  Assessment and Plan: Patient with past medical history of rectal adenocarcinoma presents with several years of clinical remission.  IR consulted for Port-A-Cath removal at the request of Dr. Burr Medico. Port was last flushed at his most recent cancer center appointment, however over the years has had intermittent care related to the [place] as his cancer-related visits were spaced out. Case reviewed by Dr. Kathlene Cote who approves patient for procedure.  Patient presents today in their usual state  of health.  He has been NPO and is not currently on blood thinners.   Risks and benefits of image guided port-a-catheter removal was discussed with the patient including, but not limited to bleeding, infection, pneumothorax.  All of the patient's questions  were answered, patient is agreeable to proceed. Consent signed and in chart.   Thank you for this interesting consult.  I greatly enjoyed meeting [name] and look forward to participating in their care.  A copy of this report was sent to the requesting provider on this date.  Electronically Signed: Docia Barrier, PA 08/27/2021, 8:49 AM   I spent a total of  30 Minutes   in face to face in clinical consultation, greater than 50% of which was counseling/coordinating care for rectal adenocarcinoma.

## 2021-08-27 ENCOUNTER — Ambulatory Visit (HOSPITAL_COMMUNITY)
Admission: RE | Admit: 2021-08-27 | Discharge: 2021-08-27 | Disposition: A | Discharge status: Home / Self Care | Payer: Medicaid Other | Source: Ambulatory Visit | Attending: Physician Assistant | Admitting: Physician Assistant

## 2021-08-27 ENCOUNTER — Other Ambulatory Visit: Payer: Self-pay

## 2021-08-27 ENCOUNTER — Encounter (HOSPITAL_COMMUNITY): Payer: Self-pay

## 2021-08-27 ENCOUNTER — Ambulatory Visit (HOSPITAL_COMMUNITY)
Admission: RE | Admit: 2021-08-27 | Discharge: 2021-08-27 | Disposition: A | Discharge status: Home / Self Care | Payer: Medicaid Other | Source: Ambulatory Visit | Attending: Hematology | Admitting: Hematology

## 2021-08-27 DIAGNOSIS — Z79899 Other long term (current) drug therapy: Secondary | ICD-10-CM | POA: Diagnosis not present

## 2021-08-27 DIAGNOSIS — J449 Chronic obstructive pulmonary disease, unspecified: Secondary | ICD-10-CM | POA: Insufficient documentation

## 2021-08-27 DIAGNOSIS — C20 Malignant neoplasm of rectum: Secondary | ICD-10-CM | POA: Insufficient documentation

## 2021-08-27 DIAGNOSIS — Z7984 Long term (current) use of oral hypoglycemic drugs: Secondary | ICD-10-CM | POA: Insufficient documentation

## 2021-08-27 DIAGNOSIS — F17210 Nicotine dependence, cigarettes, uncomplicated: Secondary | ICD-10-CM | POA: Diagnosis not present

## 2021-08-27 DIAGNOSIS — Z452 Encounter for adjustment and management of vascular access device: Secondary | ICD-10-CM | POA: Diagnosis not present

## 2021-08-27 HISTORY — PX: IR REMOVAL TUN ACCESS W/ PORT W/O FL MOD SED: IMG2290

## 2021-08-27 LAB — GLUCOSE, CAPILLARY: Glucose-Capillary: 219 mg/dL — ABNORMAL HIGH (ref 70–99)

## 2021-08-27 MED ORDER — LIDOCAINE HCL 1 % IJ SOLN
INTRAMUSCULAR | Status: AC
  Filled 2021-08-27: qty 20

## 2021-08-27 MED ORDER — LIDOCAINE HCL (PF) 1 % IJ SOLN
INTRAMUSCULAR | Status: DC | PRN
  Administered 2021-08-27: 10 mL via INTRADERMAL

## 2021-08-27 MED ORDER — FENTANYL CITRATE (PF) 100 MCG/2ML IJ SOLN
INTRAMUSCULAR | Status: AC
  Filled 2021-08-27: qty 2

## 2021-08-27 MED ORDER — SODIUM CHLORIDE 0.9 % IV SOLN: INTRAVENOUS | Status: DC

## 2021-08-27 MED ORDER — FENTANYL CITRATE (PF) 100 MCG/2ML IJ SOLN
INTRAMUSCULAR | Status: DC | PRN
  Administered 2021-08-27: 50 ug via INTRAVENOUS

## 2021-08-27 MED ORDER — MIDAZOLAM HCL 2 MG/2ML IJ SOLN
INTRAMUSCULAR | Status: AC
  Filled 2021-08-27: qty 4

## 2021-08-27 MED ORDER — MIDAZOLAM HCL 2 MG/2ML IJ SOLN
INTRAMUSCULAR | Status: DC | PRN
  Administered 2021-08-27: 1 mg via INTRAVENOUS

## 2021-08-27 NOTE — Discharge Instructions (Signed)
Please call Interventional Radiology clinic [phone] with any questions or concerns.  You may remove your dressing and shower tomorrow.   Implanted Port Removal, Care After This sheet gives you information about how to care for yourself after your procedure. Your health care provider may also give you more specific instructions. If you have problems or questions, contact your health care provider. What can I expect after the procedure? After the procedure, it is common to have: Soreness or pain near your incision. Some swelling or bruising near your incision. Follow these instructions at home: Medicines Take over-the-counter and prescription medicines only as told by your health care provider. If you were prescribed an antibiotic medicine, take it as told by your health care provider. Do not stop taking the antibiotic even if you start to feel better. Bathing Do not take baths, swim, or use a hot tub until your health care provider approves. Ask your health care provider if you can take showers. You may only be allowed to take sponge baths. Incision care Follow instructions from your health care provider about how to take care of your incision. Make sure you: Wash your hands with soap and water before you change your bandage (dressing). If soap and water are not available, use hand sanitizer. Change your dressing as told by your health care provider. Keep your dressing dry. Leave stitches (sutures), skin glue, or adhesive strips in place. These skin closures may need to stay in place for 2 weeks or longer. If adhesive strip edges start to loosen and curl up, you may trim the loose edges. Do not remove adhesive strips completely unless your health care provider tells you to do that. Check your incision area every day for signs of infection. Check for: More redness, swelling, or pain. More fluid or blood. Warmth. Pus or a bad smell.    Driving Do not drive for 24 hours if you were  given a medicine to help you relax (sedative) during your procedure. If you did not receive a sedative, ask your health care provider when it is safe to drive.    Activity Return to your normal activities as told by your health care provider. Ask your health care provider what activities are safe for you. Do not lift anything that is heavier than 10 lb (4.5 kg), or the limit that you are told, until your health care provider says that it is safe. Do not do activities that involve lifting your arms over your head. General instructions Do not use any products that contain nicotine or tobacco, such as cigarettes and e-cigarettes. These can delay healing. If you need help quitting, ask your health care provider. Keep all follow-up visits as told by your health care provider. This is important. Contact a health care provider if: You have more redness, swelling, or pain around your incision. You have more fluid or blood coming from your incision. Your incision feels warm to the touch. You have pus or a bad smell coming from your incision. You have pain that is not relieved by your pain medicine. Get help right away if you have: A fever or chills. Chest pain. Difficulty breathing. Summary After the procedure, it is common to have pain, soreness, swelling, or bruising near your incision. If you were prescribed an antibiotic medicine, take it as told by your health care provider. Do not stop taking the antibiotic even if you start to feel better. Do not drive for 24 hours if you were given a sedative during   your procedure. Return to your normal activities as told by your health care provider. Ask your health care provider what activities are safe for you. This information is not intended to replace advice given to you by your health care provider. Make sure you discuss any questions you have with your health care provider. Document Revised: 01/19/2018 Document Reviewed: 01/19/2018 Elsevier Patient  Education  2021 Elsevier Inc.   Moderate Conscious Sedation, Adult, Care After This sheet gives you information about how to care for yourself after your procedure. Your health care provider may also give you more specific instructions. If you have problems or questions, contact your health care provider. What can I expect after the procedure? After the procedure, it is common to have: Sleepiness for several hours. Impaired judgment for several hours. Difficulty with balance. Vomiting if you eat too soon. Follow these instructions at home: For the time period you were told by your health care provider: Rest. Do not participate in activities where you could fall or become injured. Do not drive or use machinery. Do not drink alcohol. Do not take sleeping pills or medicines that cause drowsiness. Do not make important decisions or sign legal documents. Do not take care of children on your own.        Eating and drinking Follow the diet recommended by your health care provider. Drink enough fluid to keep your urine pale yellow. If you vomit: Drink water, juice, or soup when you can drink without vomiting. Make sure you have little or no nausea before eating solid foods.    General instructions Take over-the-counter and prescription medicines only as told by your health care provider. Have a responsible adult stay with you for the time you are told. It is important to have someone help care for you until you are awake and alert. Do not smoke. Keep all follow-up visits as told by your health care provider. This is important. Contact a health care provider if: You are still sleepy or having trouble with balance after 24 hours. You feel light-headed. You keep feeling nauseous or you keep vomiting. You develop a rash. You have a fever. You have redness or swelling around the IV site. Get help right away if: You have trouble breathing. You have new-onset confusion at  home. Summary After the procedure, it is common to feel sleepy, have impaired judgment, or feel nauseous if you eat too soon. Rest after you get home. Know the things you should not do after the procedure. Follow the diet recommended by your health care provider and drink enough fluid to keep your urine pale yellow. Get help right away if you have trouble breathing or new-onset confusion at home. This information is not intended to replace advice given to you by your health care provider. Make sure you discuss any questions you have with your health care provider. Document Revised: 04/04/2020 Document Reviewed: 11/01/2019 Elsevier Patient Education  2021 Elsevier Inc.   

## 2021-08-27 NOTE — Procedures (Signed)
Interventional Radiology Procedure Note  Procedure: Port removal  Complications: None  Estimated Blood Loss: < 10 mL  Findings: Right chest port removed in entirety with sharp and blunt dissection. Wound closed.  Venetia Night. Kathlene Cote, M.D Pager:  [phone]

## 2021-09-07 ENCOUNTER — Other Ambulatory Visit: Payer: Self-pay | Admitting: Pulmonary Disease

## 2021-09-07 ENCOUNTER — Other Ambulatory Visit: Payer: Self-pay | Admitting: Family Medicine

## 2021-09-25 DIAGNOSIS — Z432 Encounter for attention to ileostomy: Secondary | ICD-10-CM | POA: Diagnosis not present

## 2021-09-25 DIAGNOSIS — Z932 Ileostomy status: Secondary | ICD-10-CM | POA: Diagnosis not present

## 2021-10-07 ENCOUNTER — Telehealth: Payer: Self-pay | Admitting: *Deleted

## 2021-10-07 NOTE — Telephone Encounter (Signed)
Contacted pts pharmacy to see what was going on with the pts albuterol Rx. I spoke with Magda Paganini and it appears that it was being sent on their  part with different sizing  and it was kicking back saying to send it as Ventolin.They took a verbal and were doing a new Rx and if there was any problems then they would call and clarify any questions they had.Francisca Langenderfer Zimmerman Rumple, CMA

## 2021-10-26 ENCOUNTER — Other Ambulatory Visit: Payer: Self-pay | Admitting: Family Medicine

## 2021-10-26 DIAGNOSIS — E1169 Type 2 diabetes mellitus with other specified complication: Secondary | ICD-10-CM

## 2021-10-26 DIAGNOSIS — E785 Hyperlipidemia, unspecified: Secondary | ICD-10-CM

## 2021-10-26 DIAGNOSIS — E1165 Type 2 diabetes mellitus with hyperglycemia: Secondary | ICD-10-CM

## 2021-12-01 ENCOUNTER — Other Ambulatory Visit: Payer: Self-pay | Admitting: Family Medicine

## 2021-12-01 DIAGNOSIS — E1165 Type 2 diabetes mellitus with hyperglycemia: Secondary | ICD-10-CM

## 2021-12-08 ENCOUNTER — Telehealth: Payer: Self-pay

## 2021-12-08 DIAGNOSIS — E1165 Type 2 diabetes mellitus with hyperglycemia: Secondary | ICD-10-CM

## 2021-12-08 MED ORDER — ACCU-CHEK GUIDE VI STRP: ORAL_STRIP | 12 refills | Status: AC

## 2021-12-08 NOTE — Telephone Encounter (Signed)
Per [facility],please resend Rx for Accu-Check guide Test Strips with testing frequency in order to bill insurance. Ottis Stain, CMA

## 2021-12-08 NOTE — Addendum Note (Signed)
Addended by: Alcus Dad on: 12/08/2021 05:03 PM   Modules accepted: Orders

## 2021-12-16 ENCOUNTER — Other Ambulatory Visit: Payer: Self-pay | Admitting: Pulmonary Disease

## 2021-12-18 NOTE — Telephone Encounter (Signed)
I have sent a refill and a follow up has been made for this patient. Nothing further needed.

## 2021-12-18 NOTE — Telephone Encounter (Signed)
OK to refill 1 month Symbicort 160-4.5 mcg TWO puffs TWICE a day.   He has not been seen in >1 year. He will need office visit in January to continue getting refills.

## 2021-12-24 ENCOUNTER — Other Ambulatory Visit: Payer: Self-pay | Admitting: *Deleted

## 2021-12-24 DIAGNOSIS — E1165 Type 2 diabetes mellitus with hyperglycemia: Secondary | ICD-10-CM

## 2021-12-25 MED ORDER — METFORMIN HCL 500 MG PO TABS: ORAL_TABLET | ORAL | 1 refills | Status: DC

## 2022-01-26 ENCOUNTER — Encounter: Payer: Self-pay | Admitting: Pulmonary Disease

## 2022-01-26 ENCOUNTER — Other Ambulatory Visit: Payer: Self-pay | Admitting: Family Medicine

## 2022-01-26 ENCOUNTER — Ambulatory Visit (INDEPENDENT_AMBULATORY_CARE_PROVIDER_SITE_OTHER): Payer: Medicaid Other | Admitting: Pulmonary Disease

## 2022-01-26 ENCOUNTER — Other Ambulatory Visit: Payer: Self-pay

## 2022-01-26 DIAGNOSIS — J441 Chronic obstructive pulmonary disease with (acute) exacerbation: Secondary | ICD-10-CM | POA: Diagnosis not present

## 2022-01-26 DIAGNOSIS — F17210 Nicotine dependence, cigarettes, uncomplicated: Secondary | ICD-10-CM

## 2022-01-26 MED ORDER — PREDNISONE 10 MG PO TABS: 40.0000 mg | ORAL_TABLET | Freq: Every day | ORAL | 0 refills | Status: AC

## 2022-01-26 MED ORDER — BUDESONIDE-FORMOTEROL FUMARATE 160-4.5 MCG/ACT IN AERO: 2.0000 | INHALATION_SPRAY | Freq: Two times a day (BID) | RESPIRATORY_TRACT | 11 refills | Status: DC

## 2022-01-26 MED ORDER — SPIRIVA RESPIMAT 2.5 MCG/ACT IN AERS: 2.0000 | INHALATION_SPRAY | Freq: Every day | RESPIRATORY_TRACT | 11 refills | Status: DC

## 2022-01-26 MED ORDER — ALBUTEROL SULFATE HFA 108 (90 BASE) MCG/ACT IN AERS: 2.0000 | INHALATION_SPRAY | RESPIRATORY_TRACT | 11 refills | Status: DC | PRN

## 2022-01-26 NOTE — Progress Notes (Signed)
Subjective:   PATIENT ID: [name] GENDER: male DOB: [birth year], MRN: [id number]   HPI  Chief Complaint  Patient presents with   Follow-up    DOE Using rescue inhaler in place of symbicort and spiriva    Reason for Visit: Follow-up  Mr. 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  08/14/20 Initial Consult He is referred to Pulmonary clinic by Dr. Burr Medico. He has had worsening shortness of breath in the last year and was started on Spiriva 2-3 months ago. He has also been using a nebulizer once a night and an albuterol inhaler 2-3 times day. Associated with wheezing. No cough. His dyspnea worsens with activity. He has noticed he now has difficulty mowing the lawn and other activities with moderate to heavy exertion. Rest and the rescue inhalers improve his symptoms. He does not notice any triggers like allergies or changes in weather associated. He is an active smoke and currently smoking 1ppd.   01/26/22 Wife is present and provides history. He reports his shortness of breath has worsened. Difficult with exertion and no longer able to walk to mailbox or do yardwork without having significant exertion. He continues to smoke 1 ppd. Has mixed cough with brown sputum with wheezing. He using his albuterol 6-10 times a day. He takes his Symbicort and Spiriva once a week  Social History: Current smoker. 33 pack-years  Past Medical History:  Diagnosis Date   Anxiety    C. 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 In families with MSH6 mutations, screening can start at age 44 years since the onset of colon cancer is later in these families.  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     Family History  Problem Relation Age of Onset   Diabetes Mother    COPD Father    Colon cancer Maternal Aunt        dx in her 56s   Diabetes Maternal Grandmother    Brain cancer Maternal Grandfather    Lung cancer Paternal Grandfather    Colon cancer Cousin        dx in her 3s   Bone cancer Sister 8   Pancreatic cancer Neg Hx    Esophageal cancer Neg Hx    Stomach cancer Neg Hx    Liver disease Neg Hx      Social History   Occupational History   Not on file  Tobacco Use   Smoking status: Every Day    Packs/day: 1.00    Years: 33.00    Pack years: 33.00    Types: Cigarettes    Start date: 12/20/1985   Smokeless tobacco: Never   Tobacco comments:    smokes 1 pack/day- 08/14/2020  Vaping Use  Vaping Use: Never used  Substance and Sexual Activity   Alcohol use: No   Drug use: No    Comment: patient denies   Sexual activity: Yes    No Known Allergies   Outpatient Medications Prior to Visit  Medication Sig Dispense Refill   Accu-Chek Softclix Lancets lancets Use as instructed 100 each 12   albuterol (PROVENTIL) (2.5 MG/3ML) 0.083% nebulizer solution Take 3 mLs (2.5 mg total) by nebulization every 6 (six) hours as needed for wheezing or shortness of breath. 75 mL 0   albuterol (VENTOLIN HFA) 108 (90 Base) MCG/ACT inhaler Inhale 2 puffs into the lungs every 4 (four) hours as needed for wheezing or shortness of breath. 18 g 2   atorvastatin (LIPITOR) 20 MG tablet TAKE 1 TABLET BY MOUTH EVERY DAY 90 tablet 3    budesonide-formoterol (SYMBICORT) 160-4.5 MCG/ACT inhaler INHALE 2 PUFFS INTO THE LUNGS IN THE MORNING AND AT BEDTIME. 10.2 each 1   gabapentin (NEURONTIN) 300 MG capsule TAKE 2 CAPSULES (600 MG TOTAL) BY MOUTH 3 (THREE) TIMES DAILY. 180 capsule 6   glucose blood (ACCU-CHEK GUIDE) test strip Use to check blood sugar up to 4 times daily 100 strip 12   hydrOXYzine (ATARAX/VISTARIL) 10 MG tablet TAKE 1/2 TABLET (5 MG TOTAL) BY MOUTH AT BEDTIME AS NEEDED. 45 tablet 1   metFORMIN (GLUCOPHAGE) 500 MG tablet TAKE 1 TABLET BY MOUTH EVERY MORNING AND AT BEDTIME 180 tablet 1   sertraline (ZOLOFT) 100 MG tablet TAKE 1 TABLET BY MOUTH EVERY DAY 90 tablet 3   SPIRIVA RESPIMAT 2.5 MCG/ACT AERS INHALE 2 PUFFS BY MOUTH INTO THE LUNGS DAILY 4 g 2   Facility-Administered Medications Prior to Visit  Medication Dose Route Frequency Provider Last Rate Last Admin   sodium chloride flush (NS) 0.9 % injection 10 mL  10 mL Intravenous PRN Truitt Merle, MD   10 mL at 11/18/17 0953    Review of Systems  Constitutional:  Negative for chills, diaphoresis, fever, malaise/fatigue and weight loss.  HENT:  Negative for congestion.   Respiratory:  Positive for cough, sputum production, shortness of breath and wheezing. Negative for hemoptysis.   Cardiovascular:  Negative for chest pain, palpitations and leg swelling.    Objective:   Vitals:   01/26/22 1556  BP: 110/70  Pulse: 80  Temp: 98.4 F (36.9 C)  TempSrc: Oral  SpO2: 96%  Weight: 175 lb 9.6 oz (79.7 kg)  Height: 5' 10.5" (1.791 m)     Physical Exam: General: Well-appearing, no acute distress HENT: [place], AT Eyes: EOMI, no scleral icterus Respiratory: Clear to auscultation bilaterally.  No crackles, wheezing or rales Cardiovascular: RRR, -M/R/G, no JVD Extremities:-Edema,-tenderness Neuro: AAO x4, CNII-XII grossly intact Psych: Normal mood, normal affect  Data Reviewed:  Imaging: CT Chest 06/19/20 - Interval multiple subcentimeter lung nodules since  06/25/19. Background emphysema CT Chest 08/05/21 - Stable subcentimeter lung nodules. Resolved 39m LUL. Right PAC. Background emphysema.   PFT: None  Labs: CBC    Component Value Date/Time   WBC 9.4 07/24/2021 1004   RBC 5.24 07/24/2021 1004   HGB 15.5 07/24/2021 1004   HGB 16.4 07/01/2021 1401   HGB 14.9 11/18/2017 0933   HCT 45.8 07/24/2021 1004   HCT 50.8 07/01/2021 1401   HCT 44.6 11/18/2017 0933   PLT 191 07/24/2021 1004   PLT 216 07/01/2021 1401   MCV 87.4 07/24/2021 1004   MCV 89 07/01/2021 1401   MCV 87.8 11/18/2017 0933   MCH 29.6 07/24/2021  1004   MCHC 33.8 07/24/2021 1004   RDW 13.8 07/24/2021 1004   RDW 13.5 07/01/2021 1401   RDW 14.3 11/18/2017 0933   LYMPHSABS 1.3 07/24/2021 1004   LYMPHSABS 1.3 07/01/2021 1401   LYMPHSABS 0.9 11/18/2017 0933   MONOABS 0.6 07/24/2021 1004   MONOABS 0.5 11/18/2017 0933   EOSABS 0.2 07/24/2021 1004   EOSABS 0.3 07/01/2021 1401   BASOSABS 0.1 07/24/2021 1004   BASOSABS 0.1 07/01/2021 1401   BASOSABS 0.1 11/18/2017 0933   Absolute eos  07/01/21 - 100 07/24/21 - 200    Assessment & Plan:   Discussion: 51 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 Discussed clinical course and management of COPD including smoking cessation bronchodilator regimen and action plan for exacerbation. Symptoms are currently uncontrolled due to non-adherence to triple therapy.  COPD/Emphysema exacerbation --START prednisone 40 mg daily x 5 days --START Symbicort 160-4.5 mcg TWO puffs TWICE a day. REFILLED --START Spiriva 2.5 mcg TWO puffs ONCE a day --CONTINUE your rescue inhaler (albuterol) as needed --ARRANGE for pulmonary function tests to evaluate severity of your COPD  Tobacco abuse Patient is an active smoker. We discussed smoking cessation for 8 minutes. We discussed triggers and stressors and ways to deal with them. We discussed barriers to continued smoking and benefits of smoking cessation. Provided patient with  information cessation techniques and interventions including [place] quitline.  Health Maintenance Immunization History  Administered Date(s) Administered   Influenza,inj,Quad PF,6+ Mos 09/30/2016, 11/18/2017, 09/15/2018, 09/18/2020   PFIZER(Purple Top)SARS-COV-2 Vaccination 06/17/2020, 07/16/2020   Unspecified SARS-COV-2 Vaccination 06/13/2020   CT Lung Screen - not qualified with abnormal CT  Orders Placed This Encounter  Procedures   Pulmonary function test    Standing Status:   Future    Standing Expiration Date:   01/26/2023    Order Specific Question:   Where should this test be performed?    Answer:   [facility]    Order Specific Question:   Full PFT: includes the following: basic spirometry, spirometry pre & post bronchodilator, diffusion capacity (DLCO), lung volumes    Answer:   Full PFT   Meds ordered this encounter  Medications   predniSONE (DELTASONE) 10 MG tablet    Sig: Take 4 tablets (40 mg total) by mouth daily with breakfast for 5 days.    Dispense:  20 tablet    Refill:  0   budesonide-formoterol (SYMBICORT) 160-4.5 MCG/ACT inhaler    Sig: Inhale 2 puffs into the lungs 2 (two) times daily. in the morning and at bedtime.    Dispense:  10.2 each    Refill:  11   Tiotropium Bromide Monohydrate (SPIRIVA RESPIMAT) 2.5 MCG/ACT AERS    Sig: Inhale 2 puffs into the lungs daily.    Dispense:  4 g    Refill:  11   albuterol (VENTOLIN HFA) 108 (90 Base) MCG/ACT inhaler    Sig: Inhale 2 puffs into the lungs every 4 (four) hours as needed for wheezing or shortness of breath.    Dispense:  18 g    Refill:  11   No follow-ups on file. After PFTs  I have spent a total time of 38-minutes on the day of the appointment reviewing prior documentation, coordinating care and discussing medical diagnosis and plan with the patient/family. Past medical history, allergies, medications were reviewed. Pertinent imaging, labs and tests included in this note have been reviewed and  interpreted independently by me.  Hisham Provence Rodman Pickle, MD [facility] 01/26/2022 3:27 PM  Office Number [phone]

## 2022-01-26 NOTE — Patient Instructions (Signed)
COPD/Emphysema exacerbation --START prednisone 40 mg daily x 5 days --START Symbicort 160-4.5 mcg TWO puffs TWICE a day. REFILLED --START Spiriva 2.5 mcg TWO puffs ONCE a day --CONTINUE your rescue inhaler (albuterol) as needed --ARRANGE for pulmonary function tests to evaluate severity of your COPD  Follow-up with me after pulmonary function tests in March/April

## 2022-01-28 ENCOUNTER — Encounter: Payer: Self-pay | Admitting: Pulmonary Disease

## 2022-02-15 ENCOUNTER — Telehealth: Payer: Self-pay | Admitting: Family Medicine

## 2022-02-15 NOTE — Telephone Encounter (Signed)
... °  Medicaid Managed Care   Unsuccessful Outreach Note  02/15/2022 Name: [name] MRN: [id number] DOB: [birth year]  Referred by: Alcus Dad, MD Reason for referral : High Risk Managed Medicaid (I called the patient today to get him rescheduled with the [facility] RNCM. I left my name and number on his VM.)   An unsuccessful telephone outreach was attempted today. The patient was referred to the case management team for assistance with care management and care coordination.   Follow Up Plan: The care management team will reach out to the patient again over the next 14 days.    [place]

## 2022-02-18 DIAGNOSIS — Z932 Ileostomy status: Secondary | ICD-10-CM | POA: Diagnosis not present

## 2022-02-26 ENCOUNTER — Encounter: Payer: Self-pay | Admitting: Pulmonary Disease

## 2022-02-26 ENCOUNTER — Telehealth: Payer: Self-pay | Admitting: Family Medicine

## 2022-02-26 ENCOUNTER — Ambulatory Visit (INDEPENDENT_AMBULATORY_CARE_PROVIDER_SITE_OTHER): Payer: Medicaid Other | Admitting: Pulmonary Disease

## 2022-02-26 ENCOUNTER — Other Ambulatory Visit: Payer: Self-pay

## 2022-02-26 VITALS — BP 116/70 | HR 93 | Ht 70.0 in | Wt 176.2 lb

## 2022-02-26 DIAGNOSIS — J441 Chronic obstructive pulmonary disease with (acute) exacerbation: Secondary | ICD-10-CM

## 2022-02-26 DIAGNOSIS — Z720 Tobacco use: Secondary | ICD-10-CM

## 2022-02-26 DIAGNOSIS — J432 Centrilobular emphysema: Secondary | ICD-10-CM

## 2022-02-26 DIAGNOSIS — F17210 Nicotine dependence, cigarettes, uncomplicated: Secondary | ICD-10-CM

## 2022-02-26 LAB — PULMONARY FUNCTION TEST
DL/VA % pred: 62 %
DL/VA: 2.76 ml/min/mmHg/L
DLCO cor % pred: 55 %
DLCO cor: 16.07 ml/min/mmHg
DLCO unc % pred: 55 %
DLCO unc: 16.07 ml/min/mmHg
FEF 25-75 Post: 1.25 L/sec
FEF 25-75 Pre: 0.55 L/sec
FEF2575-%Change-Post: 127 %
FEF2575-%Pred-Post: 36 %
FEF2575-%Pred-Pre: 16 %
FEV1-%Change-Post: 43 %
FEV1-%Pred-Post: 48 %
FEV1-%Pred-Pre: 33 %
FEV1-Post: 1.86 L
FEV1-Pre: 1.3 L
FEV1FVC-%Change-Post: 1 %
FEV1FVC-%Pred-Pre: 53 %
FEV6-%Change-Post: 41 %
FEV6-%Pred-Post: 88 %
FEV6-%Pred-Pre: 62 %
FEV6-Post: 4.27 L
FEV6-Pre: 3.03 L
FEV6FVC-%Change-Post: 0 %
FEV6FVC-%Pred-Post: 99 %
FEV6FVC-%Pred-Pre: 99 %
FVC-%Change-Post: 40 %
FVC-%Pred-Post: 88 %
FVC-%Pred-Pre: 63 %
FVC-Post: 4.45 L
FVC-Pre: 3.17 L
Post FEV1/FVC ratio: 42 %
Post FEV6/FVC ratio: 96 %
Pre FEV1/FVC ratio: 41 %
Pre FEV6/FVC Ratio: 96 %
RV % pred: 247 %
RV: 5.13 L
TLC % pred: 130 %
TLC: 9.11 L

## 2022-02-26 NOTE — Progress Notes (Signed)
PFT done today. 

## 2022-02-26 NOTE — Progress Notes (Signed)
Subjective:   PATIENT ID: [name] GENDER: male DOB: [birth year], MRN: [id number]   HPI  Chief Complaint  Patient presents with   Follow-up    Pft review   Reason for Visit: Follow-up  [name] is a 51 year old male active smoker with hx of rectal adenocarcinoma s/p chemoradiation and colon resection and proctocolectomy who presents for follow-up.  08/14/20 Initial Consult He is referred to Pulmonary clinic by Dr. Burr Medico. He has had worsening shortness of breath in the last year and was started on Spiriva 2-3 months ago. He has also been using a nebulizer once a night and an albuterol inhaler 2-3 times day. Associated with wheezing. No cough. His dyspnea worsens with activity. He has noticed he now has difficulty mowing the lawn and other activities with moderate to heavy exertion. Rest and the rescue inhalers improve his symptoms. He does not notice any triggers like allergies or changes in weather associated. He is an active smoke and currently smoking 1ppd.   01/26/22 [name] is present and provides history. He reports his shortness of breath has worsened. Difficult with exertion and no longer able to walk to mailbox or do yardwork without having significant exertion. He continues to smoke 1 ppd. Has mixed cough with brown sputum with wheezing. He using his albuterol 6-10 times a day. He takes his Symbicort and Spiriva once a week  02/26/22 On her last visit he was started on Symbicort and Spiriva and has been compliant. He still requires albuterol 3-4 times a day shortness of breath, cough and wheezing. He was also treated for COPD exacerbation. PFTs were scheduled for today. He has noticed a significant difference in his quality of breathing when holding his inhalers for the test. Currently smoking 1/2 ppd which is improved since our last visit.  Social History: Current smoker. 33 pack-years  Past Medical History:  Diagnosis Date   Anxiety    C. 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 In families with MSH6 mutations, screening can start at age 60 years since the onset of colon cancer is later in these families.  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     Family History  Problem Relation Age of Onset   Diabetes Mother    COPD Father    Colon cancer Maternal Aunt        dx in her 19s   Diabetes Maternal Grandmother    Brain cancer Maternal Grandfather    Lung cancer Paternal Grandfather    Colon cancer Cousin        dx in her 23s   Bone cancer Sister 8   Pancreatic cancer Neg Hx    Esophageal cancer Neg Hx    Stomach cancer Neg Hx    Liver disease Neg Hx      Social History   Occupational  History   Not on file  Tobacco Use   Smoking status: Every Day    Packs/day: 1.00    Years: 33.00    Pack years: 33.00    Types: Cigarettes    Start date: 12/20/1985   Smokeless tobacco: Never   Tobacco comments:    smokes 1 pack/day- 08/14/2020  Vaping Use   Vaping Use: Never used  Substance and Sexual Activity   Alcohol use: No   Drug use: No    Comment: patient denies   Sexual activity: Yes    No Known Allergies   Outpatient Medications Prior to Visit  Medication Sig Dispense Refill   Accu-Chek Softclix Lancets lancets Use as instructed 100 each 12   albuterol (PROVENTIL) (2.5 MG/3ML) 0.083% nebulizer  solution Take 3 mLs (2.5 mg total) by nebulization every 6 (six) hours as needed for wheezing or shortness of breath. 75 mL 0   albuterol (VENTOLIN HFA) 108 (90 Base) MCG/ACT inhaler Inhale 2 puffs into the lungs every 4 (four) hours as needed for wheezing or shortness of breath. 18 g 11   atorvastatin (LIPITOR) 20 MG tablet TAKE 1 TABLET BY MOUTH EVERY DAY 90 tablet 3   budesonide-formoterol (SYMBICORT) 160-4.5 MCG/ACT inhaler Inhale 2 puffs into the lungs 2 (two) times daily. in the morning and at bedtime. 10.2 each 11   gabapentin (NEURONTIN) 300 MG capsule TAKE 2 CAPSULES BY MOUTH 3 TIMES DAILY. 180 capsule 6   glucose blood (ACCU-CHEK GUIDE) test strip Use to check blood sugar up to 4 times daily 100 strip 12   metFORMIN (GLUCOPHAGE) 500 MG tablet TAKE 1 TABLET BY MOUTH EVERY MORNING AND AT BEDTIME 180 tablet 1   sertraline (ZOLOFT) 100 MG tablet TAKE 1 TABLET BY MOUTH EVERY DAY 90 tablet 3   Tiotropium Bromide Monohydrate (SPIRIVA RESPIMAT) 2.5 MCG/ACT AERS Inhale 2 puffs into the lungs daily. 4 g 11   hydrOXYzine (ATARAX/VISTARIL) 10 MG tablet TAKE 1/2 TABLET (5 MG TOTAL) BY MOUTH AT BEDTIME AS NEEDED. (Patient not taking: Reported on 01/26/2022) 45 tablet 1   Facility-Administered Medications Prior to Visit  Medication Dose Route Frequency Provider Last Rate Last Admin   sodium chloride flush (NS) 0.9 % injection 10 mL  10 mL Intravenous PRN Truitt Merle, MD   10 mL at 11/18/17 4801    Review of Systems  Constitutional:  Negative for chills, diaphoresis, fever, malaise/fatigue and weight loss.  HENT:  Negative for congestion.   Respiratory:  Positive for cough, sputum production, shortness of breath and wheezing. Negative for hemoptysis.   Cardiovascular:  Negative for chest pain, palpitations and leg swelling.    Objective:   Vitals:   02/26/22 0959  BP: 116/70  Pulse: 93  SpO2: 95%  Weight: 176 lb 3.2 oz (79.9 kg)  Height: _0  (1.778 m)   Physical Exam: General:  Well-appearing, no acute distress HENT: [place], AT Eyes: EOMI, no scleral icterus Respiratory: Clear to auscultation bilaterally.  No crackles, wheezing or rales Cardiovascular: RRR, -M/R/G, no JVD Extremities:-Edema,-tenderness Neuro: AAO x4, CNII-XII grossly intact Psych: Normal mood, normal affect  Data Reviewed:  Imaging: CT Chest 06/19/20 - Interval multiple subcentimeter lung nodules since 06/25/19. Background emphysema CT Chest 08/05/21 - Stable subcentimeter lung nodules. Resolved 79m LUL. Right PAC. Background emphysema.   PFT: 02/26/22 FVC 4.45 (88%) FEV1 1.86 (48%) Ratio 41  TLC 130% DLCO 55% Interpretation: Severe obstructive defect with air trapping and reduced DLCO consistent with emphysema. Significant FEV1 and FVC bronchodilator response.  Labs: CBC    Component Value Date/Time   WBC 9.4 07/24/2021 1004   RBC 5.24 07/24/2021 1004   HGB 15.5 07/24/2021 1004   HGB 16.4 07/01/2021 1401   HGB 14.9 11/18/2017 0933   HCT 45.8 07/24/2021 1004   HCT 50.8 07/01/2021 1401   HCT 44.6 11/18/2017 0933   PLT 191 07/24/2021 1004   PLT 216 07/01/2021 1401   MCV 87.4 07/24/2021 1004   MCV 89 07/01/2021 1401   MCV 87.8 11/18/2017 0933   MCH 29.6 07/24/2021 1004   MCHC 33.8 07/24/2021 1004   RDW 13.8 07/24/2021 1004   RDW 13.5 07/01/2021 1401   RDW 14.3 11/18/2017 0933   LYMPHSABS 1.3 07/24/2021 1004   LYMPHSABS 1.3 07/01/2021 1401   LYMPHSABS 0.9 11/18/2017 0933   MONOABS 0.6 07/24/2021 1004   MONOABS 0.5 11/18/2017 0933   EOSABS 0.2 07/24/2021 1004   EOSABS 0.3 07/01/2021 1401   BASOSABS 0.1 07/24/2021 1004   BASOSABS 0.1 07/01/2021 1401   BASOSABS 0.1 11/18/2017 0933   Absolute eos  07/01/21 - 100 07/24/21 - 200    Assessment & Plan:   Discussion: 51 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  PFTs were reviewed which demonstrated findings consistent with severe emphysema. His symptoms are improved but persistent with triple therapy. Discussed  clinical course and management of COPD including bronchodilator regimen and action plan for exacerbation.  Severe COPD with emphysema - symptomatic but improved, not in exacerbation --CONTINUE Symbicort 160-4.5 mcg TWO puffs TWICE a day. REFILLED --CONTINUE Spiriva 2.5 mcg TWO puffs ONCE a day --CONTINUE your rescue inhaler (albuterol) as needed --Use nebulizer meds as needed --Encourage regular aerobic exercise  Tobacco abuse Patient is an active smoker. Decreased to 1/2 ppd. Congratulated patient We discussed smoking cessation for 3 minutes. We discussed triggers and stressors and ways to deal with them. We discussed barriers to continued smoking and benefits of smoking cessation. Provided patient with information cessation techniques and interventions including [place] quitline.  Health Maintenance Immunization History  Administered Date(s) Administered   Influenza,inj,Quad PF,6+ Mos 09/30/2016, 11/18/2017, 09/15/2018, 09/18/2020   PFIZER(Purple Top)SARS-COV-2 Vaccination 06/17/2020, 07/16/2020   Unspecified SARS-COV-2 Vaccination 06/13/2020   CT Lung Screen - not qualified with abnormal CT  No orders of the defined types were placed in this encounter.  No orders of the defined types were placed in this encounter.  Return in about 6 months (around 08/29/2022).   I have spent a total time of 35-minutes on the day of the appointment reviewing prior documentation, coordinating care and discussing medical diagnosis and plan with the patient/family. Past medical history, allergies, medications were reviewed. Pertinent imaging, labs and tests included in this note have been reviewed and interpreted independently by me.  [place], MD [facility] 02/26/2022 11:08 AM  Office Number [phone]

## 2022-02-26 NOTE — Telephone Encounter (Signed)
.. ?  Medicaid Managed Care  ? ?Unsuccessful Outreach Note ? ?02/26/2022 ?Name: WALLACE GAPPA MRN: [id number] DOB: 04-13-1971 ? ?Referred by: Alcus Dad, MD ?Reason for referral : High Risk Managed Medicaid (THird attempt to reach the patient to get him rescheduled with the MM RNCM. ) ? ? ?Third unsuccessful telephone outreach was attempted today. The patient was referred to the case management team for assistance with care management and care coordination. The patient's primary care provider has been notified of our unsuccessful attempts to make or maintain contact with the patient. The care management team is pleased to engage with this patient at any time in the future should he/she be interested in assistance from the care management team.  ? ?Follow Up Plan: We have been unable to make contact with the patient for follow up. The care management team is available to follow up with the patient after provider conversation with the patient regarding recommendation for care management engagement and subsequent re-referral to the care management team.  ? ? ?Reita Chard ?Care Guide, High Risk Medicaid Managed Care ?Embedded Care Coordination ?[place]  ? ? ? ?

## 2022-02-26 NOTE — Patient Instructions (Signed)
Severe COPD with emphysema  ?--CONTINUE Symbicort 160-4.5 mcg TWO puffs TWICE a day. REFILLED ?--CONTINUE Spiriva 2.5 mcg TWO puffs ONCE a day ?--CONTINUE your rescue inhaler (albuterol) as needed ?--Use nebulizer meds as needed ? ?Tobacco abuse ?Patient is an active smoker. Decreased to 1/2 ppd ?We discussed smoking cessation for 3 minutes. We discussed triggers and stressors and ways to deal with them. We discussed barriers to continued smoking and benefits of smoking cessation. Provided patient with information cessation techniques and interventions including [place] quitline. ? ?Follow-up with me in 6 months ?

## 2022-03-23 DIAGNOSIS — Z932 Ileostomy status: Secondary | ICD-10-CM | POA: Diagnosis not present

## 2022-05-03 DIAGNOSIS — Z932 Ileostomy status: Secondary | ICD-10-CM | POA: Diagnosis not present

## 2022-05-25 ENCOUNTER — Encounter: Payer: Self-pay | Admitting: *Deleted

## 2022-06-07 DIAGNOSIS — Z932 Ileostomy status: Secondary | ICD-10-CM | POA: Diagnosis not present

## 2022-06-18 ENCOUNTER — Encounter (HOSPITAL_BASED_OUTPATIENT_CLINIC_OR_DEPARTMENT_OTHER): Payer: Self-pay

## 2022-06-18 ENCOUNTER — Emergency Department (HOSPITAL_BASED_OUTPATIENT_CLINIC_OR_DEPARTMENT_OTHER): Payer: Medicaid Other

## 2022-06-18 ENCOUNTER — Other Ambulatory Visit: Payer: Self-pay

## 2022-06-18 ENCOUNTER — Inpatient Hospital Stay (HOSPITAL_BASED_OUTPATIENT_CLINIC_OR_DEPARTMENT_OTHER)
Admission: EM | Admit: 2022-06-18 | Discharge: 2022-06-20 | DRG: 855 | Disposition: A | Discharge status: Home / Self Care | Payer: Medicaid Other | Attending: Family Medicine | Admitting: Family Medicine

## 2022-06-18 DIAGNOSIS — Z933 Colostomy status: Secondary | ICD-10-CM

## 2022-06-18 DIAGNOSIS — Z833 Family history of diabetes mellitus: Secondary | ICD-10-CM

## 2022-06-18 DIAGNOSIS — Z79899 Other long term (current) drug therapy: Secondary | ICD-10-CM

## 2022-06-18 DIAGNOSIS — N4889 Other specified disorders of penis: Secondary | ICD-10-CM | POA: Diagnosis present

## 2022-06-18 DIAGNOSIS — R1030 Lower abdominal pain, unspecified: Secondary | ICD-10-CM | POA: Diagnosis not present

## 2022-06-18 DIAGNOSIS — D72829 Elevated white blood cell count, unspecified: Secondary | ICD-10-CM

## 2022-06-18 DIAGNOSIS — K8020 Calculus of gallbladder without cholecystitis without obstruction: Secondary | ICD-10-CM | POA: Diagnosis present

## 2022-06-18 DIAGNOSIS — R109 Unspecified abdominal pain: Secondary | ICD-10-CM | POA: Diagnosis not present

## 2022-06-18 DIAGNOSIS — F419 Anxiety disorder, unspecified: Secondary | ICD-10-CM | POA: Diagnosis present

## 2022-06-18 DIAGNOSIS — F32A Depression, unspecified: Secondary | ICD-10-CM | POA: Diagnosis present

## 2022-06-18 DIAGNOSIS — Z7951 Long term (current) use of inhaled steroids: Secondary | ICD-10-CM

## 2022-06-18 DIAGNOSIS — Z923 Personal history of irradiation: Secondary | ICD-10-CM

## 2022-06-18 DIAGNOSIS — Z800 Family history of malignant neoplasm of digestive organs: Secondary | ICD-10-CM

## 2022-06-18 DIAGNOSIS — Z9221 Personal history of antineoplastic chemotherapy: Secondary | ICD-10-CM

## 2022-06-18 DIAGNOSIS — Z7984 Long term (current) use of oral hypoglycemic drugs: Secondary | ICD-10-CM

## 2022-06-18 DIAGNOSIS — Z720 Tobacco use: Secondary | ICD-10-CM | POA: Diagnosis present

## 2022-06-18 DIAGNOSIS — R000 Tachycardia, unspecified: Secondary | ICD-10-CM | POA: Diagnosis not present

## 2022-06-18 DIAGNOSIS — L0390 Cellulitis, unspecified: Secondary | ICD-10-CM

## 2022-06-18 DIAGNOSIS — A419 Sepsis, unspecified organism: Principal | ICD-10-CM | POA: Diagnosis present

## 2022-06-18 DIAGNOSIS — R1909 Other intra-abdominal and pelvic swelling, mass and lump: Secondary | ICD-10-CM | POA: Diagnosis not present

## 2022-06-18 DIAGNOSIS — Z85048 Personal history of other malignant neoplasm of rectum, rectosigmoid junction, and anus: Secondary | ICD-10-CM

## 2022-06-18 DIAGNOSIS — E1169 Type 2 diabetes mellitus with other specified complication: Secondary | ICD-10-CM | POA: Diagnosis present

## 2022-06-18 DIAGNOSIS — N492 Inflammatory disorders of scrotum: Secondary | ICD-10-CM | POA: Diagnosis present

## 2022-06-18 DIAGNOSIS — Z1509 Genetic susceptibility to other malignant neoplasm: Secondary | ICD-10-CM

## 2022-06-18 DIAGNOSIS — R911 Solitary pulmonary nodule: Secondary | ICD-10-CM | POA: Diagnosis present

## 2022-06-18 DIAGNOSIS — E1140 Type 2 diabetes mellitus with diabetic neuropathy, unspecified: Secondary | ICD-10-CM | POA: Diagnosis present

## 2022-06-18 DIAGNOSIS — E785 Hyperlipidemia, unspecified: Secondary | ICD-10-CM | POA: Diagnosis present

## 2022-06-18 DIAGNOSIS — F17210 Nicotine dependence, cigarettes, uncomplicated: Secondary | ICD-10-CM | POA: Diagnosis present

## 2022-06-18 DIAGNOSIS — M792 Neuralgia and neuritis, unspecified: Secondary | ICD-10-CM | POA: Diagnosis present

## 2022-06-18 DIAGNOSIS — Z825 Family history of asthma and other chronic lower respiratory diseases: Secondary | ICD-10-CM

## 2022-06-18 DIAGNOSIS — J432 Centrilobular emphysema: Secondary | ICD-10-CM | POA: Diagnosis present

## 2022-06-18 LAB — CBC WITH DIFFERENTIAL/PLATELET
Abs Immature Granulocytes: 0.23 10*3/uL — ABNORMAL HIGH (ref 0.00–0.07)
Basophils Absolute: 0.1 10*3/uL (ref 0.0–0.1)
Basophils Relative: 0 %
Eosinophils Absolute: 0.1 10*3/uL (ref 0.0–0.5)
Eosinophils Relative: 1 %
HCT: 47.4 % (ref 39.0–52.0)
Hemoglobin: 15.7 g/dL (ref 13.0–17.0)
Immature Granulocytes: 1 %
Lymphocytes Relative: 7 %
Lymphs Abs: 1.3 10*3/uL (ref 0.7–4.0)
MCH: 28.6 pg (ref 26.0–34.0)
MCHC: 33.1 g/dL (ref 30.0–36.0)
MCV: 86.5 fL (ref 80.0–100.0)
Monocytes Absolute: 1.3 10*3/uL — ABNORMAL HIGH (ref 0.1–1.0)
Monocytes Relative: 7 %
Neutro Abs: 15.8 10*3/uL — ABNORMAL HIGH (ref 1.7–7.7)
Neutrophils Relative %: 84 %
Platelets: 154 10*3/uL (ref 150–400)
RBC: 5.48 MIL/uL (ref 4.22–5.81)
RDW: 14.8 % (ref 11.5–15.5)
WBC: 18.8 10*3/uL — ABNORMAL HIGH (ref 4.0–10.5)
nRBC: 0 % (ref 0.0–0.2)

## 2022-06-18 LAB — URINALYSIS, ROUTINE W REFLEX MICROSCOPIC
Bilirubin Urine: NEGATIVE
Glucose, UA: NEGATIVE mg/dL
Hgb urine dipstick: NEGATIVE
Ketones, ur: NEGATIVE mg/dL
Leukocytes,Ua: NEGATIVE
Nitrite: NEGATIVE
Protein, ur: NEGATIVE mg/dL
Specific Gravity, Urine: 1.011 (ref 1.005–1.030)
pH: 6 (ref 5.0–8.0)

## 2022-06-18 LAB — COMPREHENSIVE METABOLIC PANEL
ALT: 25 U/L (ref 0–44)
AST: 22 U/L (ref 15–41)
Albumin: 3.6 g/dL (ref 3.5–5.0)
Alkaline Phosphatase: 49 U/L (ref 38–126)
Anion gap: 7 (ref 5–15)
BUN: 8 mg/dL (ref 6–20)
CO2: 26 mmol/L (ref 22–32)
Calcium: 9 mg/dL (ref 8.9–10.3)
Chloride: 102 mmol/L (ref 98–111)
Creatinine, Ser: 1.12 mg/dL (ref 0.61–1.24)
GFR, Estimated: >60 mL/min (ref >60)
Glucose, Bld: 114 mg/dL — ABNORMAL HIGH (ref 70–99)
Potassium: 4 mmol/L (ref 3.5–5.1)
Sodium: 135 mmol/L (ref 135–145)
Total Bilirubin: 0.7 mg/dL (ref 0.3–1.2)
Total Protein: 6.4 g/dL — ABNORMAL LOW (ref 6.5–8.1)

## 2022-06-18 LAB — LACTIC ACID, PLASMA: Lactic Acid, Venous: 1.8 mmol/L (ref 0.5–1.9)

## 2022-06-18 MED ORDER — LACTATED RINGERS IV BOLUS (SEPSIS)
1000.0000 mL | Freq: Once | INTRAVENOUS | Status: AC
  Administered 2022-06-18: 1000 mL via INTRAVENOUS

## 2022-06-18 MED ORDER — FENTANYL CITRATE PF 50 MCG/ML IJ SOSY
50.0000 ug | PREFILLED_SYRINGE | Freq: Once | INTRAMUSCULAR | Status: AC
  Administered 2022-06-18: 50 ug via INTRAVENOUS
  Filled 2022-06-18: qty 1

## 2022-06-18 MED ORDER — SODIUM CHLORIDE 0.9 % IV SOLN
2.0000 g | Freq: Three times a day (TID) | INTRAVENOUS | Status: DC
  Administered 2022-06-19 – 2022-06-20 (×4): 2 g via INTRAVENOUS
  Filled 2022-06-18 (×4): qty 12.5

## 2022-06-18 MED ORDER — IOHEXOL 300 MG/ML  SOLN
100.0000 mL | Freq: Once | INTRAMUSCULAR | Status: AC | PRN
  Administered 2022-06-18: 100 mL via INTRAVENOUS

## 2022-06-18 MED ORDER — METRONIDAZOLE 500 MG/100ML IV SOLN
500.0000 mg | Freq: Once | INTRAVENOUS | Status: AC
  Administered 2022-06-18: 500 mg via INTRAVENOUS
  Filled 2022-06-18: qty 100

## 2022-06-18 MED ORDER — VANCOMYCIN HCL IN DEXTROSE 1-5 GM/200ML-% IV SOLN
1000.0000 mg | Freq: Once | INTRAVENOUS | Status: AC
  Administered 2022-06-18: 1000 mg via INTRAVENOUS
  Filled 2022-06-18: qty 200

## 2022-06-18 MED ORDER — VANCOMYCIN HCL IN DEXTROSE 1-5 GM/200ML-% IV SOLN
1000.0000 mg | Freq: Two times a day (BID) | INTRAVENOUS | Status: DC
  Administered 2022-06-19 – 2022-06-20 (×3): 1000 mg via INTRAVENOUS
  Filled 2022-06-18 (×3): qty 200

## 2022-06-18 MED ORDER — SODIUM CHLORIDE 0.9 % IV SOLN
2.0000 g | Freq: Once | INTRAVENOUS | Status: AC
  Administered 2022-06-18: 2 g via INTRAVENOUS
  Filled 2022-06-18: qty 12.5

## 2022-06-18 MED ORDER — ACETAMINOPHEN 325 MG PO TABS
650.0000 mg | ORAL_TABLET | Freq: Once | ORAL | Status: AC
  Administered 2022-06-18: 650 mg via ORAL
  Filled 2022-06-18: qty 2

## 2022-06-18 MED ORDER — LACTATED RINGERS IV BOLUS (SEPSIS)
500.0000 mL | Freq: Once | INTRAVENOUS | Status: AC
  Administered 2022-06-18: 500 mL via INTRAVENOUS

## 2022-06-18 MED ORDER — ACETAMINOPHEN 325 MG PO TABS
ORAL_TABLET | ORAL | Status: AC
  Filled 2022-06-18: qty 2

## 2022-06-18 NOTE — ED Triage Notes (Signed)
Patient arrives with complaints of worsening groin swelling x3 days. Patient states that it started with his right testicle but has spread to the rest of his groin.   Patient reports no urinary symptoms. Patient also reports history of colorectal cancer a few years (has a colostomy bag) and had radiation to site.

## 2022-06-18 NOTE — ED Notes (Signed)
Provider states no second lactic acid needed.

## 2022-06-18 NOTE — Progress Notes (Signed)
Pharmacy Antibiotic Note  [name] is a 51 y.o. male for which pharmacy has been consulted for cefepime and vancomycin dosing for sepsis.  Patient with a history of rectal adenocarcinoma s/p chemoradiation and colon resection and proctocolectomy. Patient presenting with groin swelling.  SCr 1.12 WBC 18.8; LA 1.8; T 99.9 F; HR 122; RR 22  Plan: Metronidazole per MD Cefepime 2g q8hr Vancomycin 1000 mg given once at [facility] ED. Will start vancomycin 1000 mg q12hr (eAUC 496.1) unless change in renal function Trend WBC, Fever, Renal function, & Clinical course F/u cultures, clinical course, WBC, fever De-escalate when able Levels at steady state  Height: '5\' 10"'$  (177.8 cm) Weight: 79.4 kg (175 lb) IBW/kg (Calculated) : 73  Temp (24hrs), Avg:98.7 F (37.1 C), Min:98.7 F (37.1 C), Max:98.7 F (37.1 C)  No results for input(s): "WBC", "CREATININE", "LATICACIDVEN", "VANCOTROUGH", "VANCOPEAK", "VANCORANDOM", "GENTTROUGH", "GENTPEAK", "GENTRANDOM", "TOBRATROUGH", "TOBRAPEAK", "TOBRARND", "AMIKACINPEAK", "AMIKACINTROU", "AMIKACIN" in the last 168 hours.  CrCl cannot be calculated (Patient's most recent lab result is older than the maximum 21 days allowed.).    No Known Allergies  Antimicrobials this admission: cefepime 6/30 >>  vancomycin 6/30 >>  metronidazole 6/30 >>   Microbiology results: Pending  Thank you for allowing pharmacy to be a part of this patient's care.  Lorelei Pont, PharmD, BCPS 06/18/2022 8:45 PM ED Clinical Pharmacist -  [phone]

## 2022-06-18 NOTE — ED Notes (Signed)
Patient transported to CT 

## 2022-06-18 NOTE — ED Provider Notes (Signed)
[facility] EMERGENCY DEPT Provider Note   CSN: [id number] Arrival date & time: 06/18/22  1743     History  Chief Complaint  Patient presents with   Groin Swelling    [name] is a 51 y.o. male.  Presented to the emergency department due to concern for groin swelling.  Patient states a few days ago he noted some redness and swelling to his right testicle and then it spread to his left testicle.  It then subsequently spread to his penis.  He states that he feels like he has had chills.  Also had fever today.  Has a prior history of colorectal cancer, has colostomy.  In remission.  Reviewed last PCP visit.  Does have history of diabetes, COPD, smoker.    HPI     Home Medications Prior to Admission medications   Medication Sig Start Date End Date Taking? Authorizing Provider  Accu-Chek Softclix Lancets lancets Use as instructed 09/25/20   Meccariello, Bernita Raisin, DO  albuterol (PROVENTIL) (2.5 MG/3ML) 0.083% nebulizer solution Take 3 mLs (2.5 mg total) by nebulization every 6 (six) hours as needed for wheezing or shortness of breath. 07/15/21   Simmons-Robinson, Riki Sheer, MD  albuterol (VENTOLIN HFA) 108 (90 Base) MCG/ACT inhaler Inhale 2 puffs into the lungs every 4 (four) hours as needed for wheezing or shortness of breath. 01/26/22   Margaretha Seeds, MD  atorvastatin (LIPITOR) 20 MG tablet TAKE 1 TABLET BY MOUTH EVERY DAY 10/26/21   Alcus Dad, MD  budesonide-formoterol [facility]) 160-4.5 MCG/ACT inhaler Inhale 2 puffs into the lungs 2 (two) times daily. in the morning and at bedtime. 01/26/22   Margaretha Seeds, MD  gabapentin (NEURONTIN) 300 MG capsule TAKE 2 CAPSULES BY MOUTH 3 TIMES DAILY. 01/27/22   Sharion Settler, DO  glucose blood (ACCU-CHEK GUIDE) test strip Use to check blood sugar up to 4 times daily 12/08/21   Alcus Dad, MD  metFORMIN (GLUCOPHAGE) 500 MG tablet TAKE 1 TABLET BY MOUTH EVERY MORNING AND AT BEDTIME 12/25/21   Alcus Dad, MD   sertraline (ZOLOFT) 100 MG tablet TAKE 1 TABLET BY MOUTH EVERY DAY 06/09/21   Meccariello, Bernita Raisin, DO  Tiotropium Bromide Monohydrate (SPIRIVA RESPIMAT) 2.5 MCG/ACT AERS Inhale 2 puffs into the lungs daily. 01/26/22   Margaretha Seeds, MD      Allergies    Patient has no known allergies.    Review of Systems   Review of Systems  Constitutional:  Negative for chills and fever.  HENT:  Negative for ear pain and sore throat.   Eyes:  Negative for pain and visual disturbance.  Respiratory:  Negative for cough and shortness of breath.   Cardiovascular:  Negative for chest pain and palpitations.  Gastrointestinal:  Negative for abdominal pain and vomiting.  Genitourinary:  Positive for penile swelling and scrotal swelling. Negative for dysuria and hematuria.  Musculoskeletal:  Negative for arthralgias and back pain.  Skin:  Positive for color change. Negative for rash and wound.  Neurological:  Negative for seizures and syncope.  All other systems reviewed and are negative.   Physical Exam Updated Vital Signs BP 127/90   Pulse (!) 117   Temp (!) 101.2 F (38.4 C) (Oral)   Resp (!) 22   Ht '5\' 10"'$  (1.778 m)   Wt 79.4 kg   SpO2 93%   BMI 25.11 kg/m  Physical Exam Vitals and nursing note reviewed.  Constitutional:      General: He is not in acute  distress.    Appearance: He is well-developed.  HENT:     Head: Normocephalic and atraumatic.  Eyes:     Conjunctiva/sclera: Conjunctivae normal.  Cardiovascular:     Rate and Rhythm: Regular rhythm. Tachycardia present.     Heart sounds: No murmur heard. Pulmonary:     Effort: Pulmonary effort is normal. No respiratory distress.     Breath sounds: Normal breath sounds.  Abdominal:     Palpations: Abdomen is soft.     Tenderness: There is no abdominal tenderness.  Genitourinary:    Comments: Patient has generalized swelling to his scrotum, penis, erythema to scrotum and penis, no induration or crepitus noted  No tenderness to  palpation to perineal region Musculoskeletal:        General: No swelling.     Cervical back: Neck supple.  Skin:    General: Skin is warm and dry.     Capillary Refill: Capillary refill takes less than 2 seconds.  Neurological:     Mental Status: He is alert.  Psychiatric:        Mood and Affect: Mood normal.     ED Results / Procedures / Treatments   Labs (all labs ordered are listed, but only abnormal results are displayed) Labs Reviewed  CBC WITH DIFFERENTIAL/PLATELET - Abnormal; Notable for the following components:      Result Value   WBC 18.8 (*)    Neutro Abs 15.8 (*)    Monocytes Absolute 1.3 (*)    Abs Immature Granulocytes 0.23 (*)    All other components within normal limits  CULTURE, BLOOD (ROUTINE X 2)  CULTURE, BLOOD (ROUTINE X 2)  URINE CULTURE  URINALYSIS, ROUTINE W REFLEX MICROSCOPIC  LACTIC ACID, PLASMA  LACTIC ACID, PLASMA  COMPREHENSIVE METABOLIC PANEL    EKG None  Radiology US SCROTUM W/DOPPLER  Result Date: 06/18/2022 CLINICAL DATA:  Left groin swelling and pain for 3 days. EXAM: SCROTAL ULTRASOUND DOPPLER ULTRASOUND OF THE TESTICLES TECHNIQUE: Complete ultrasound examination of the testicles, epididymis, and other scrotal structures was performed. Color and spectral Doppler ultrasound were also utilized to evaluate blood flow to the testicles. COMPARISON:  None Available. FINDINGS: Right testicle Measurements: 3.4 x 1.7 x 2.5 cm. No mass or microlithiasis visualized. Left testicle Measurements: 4.1 x 2 x 2 cm. No mass or microlithiasis visualized. Right epididymis:  Normal in size and appearance. Left epididymis:  Normal in size and appearance. Hydrocele:  None visualized. Varicocele:  None visualized. Pulsed Doppler interrogation of both testes demonstrates normal low resistance arterial and venous waveforms bilaterally. Soft tissues: Left groin superficial soft tissues are edematous and hypervascular IMPRESSION: 1. Left groin soft tissue inflammation  reflected sonographically by edema and hypervascularity. No mass or fluid collection. 2. No testicular mass or torsion. No evidence of epididymitis/orchitis. Electronically Signed   By: Lajean Manes M.D.   On: 06/18/2022 20:26    Procedures .Critical Care  Performed by: Lucrezia Starch, MD Authorized by: Lucrezia Starch, MD   Critical care provider statement:    Critical care time (minutes):  54   Critical care was time spent personally by me on the following activities:  Development of treatment plan with patient or surrogate, discussions with consultants, evaluation of patient's response to treatment, examination of patient, ordering and review of laboratory studies, ordering and review of radiographic studies, ordering and performing treatments and interventions, pulse oximetry, re-evaluation of patient's condition and review of old charts     Medications Ordered in ED Medications  lactated ringers bolus 1,000 mL (1,000 mLs Intravenous New Bag/Given 06/18/22 2105)    And  lactated ringers bolus 1,000 mL (1,000 mLs Intravenous New Bag/Given 06/18/22 2110)    And  lactated ringers bolus 500 mL (has no administration in time range)  ceFEPIme (MAXIPIME) 2 g in sodium chloride 0.9 % 100 mL IVPB (2 g Intravenous New Bag/Given 06/18/22 2112)  metroNIDAZOLE (FLAGYL) IVPB 500 mg (500 mg Intravenous New Bag/Given 06/18/22 2115)  vancomycin (VANCOCIN) IVPB 1000 mg/200 mL premix (has no administration in time range)  acetaminophen (TYLENOL) tablet 650 mg (650 mg Oral Given 06/18/22 2120)  fentaNYL (SUBLIMAZE) injection 50 mcg (50 mcg Intravenous Given 06/18/22 2108)    ED Course/ Medical Decision Making/ A&P                           Medical Decision Making Amount and/or Complexity of Data Reviewed Labs: ordered. Radiology: ordered. ECG/medicine tests: ordered.  Risk OTC drugs. Prescription drug management.   51 year old gentleman presenting to the emergency department due to concern  for redness and swelling of the scrotum and penis.  On my evaluation patient was noted to be tachycardic and febrile, had obvious swelling in redness to his scrotum and penis.  I did not appreciate any crepitus or induration in his scrotum.  He had no pain or swelling in his perineal area.  Concern for possibility of sepsis, cellulitis, abscess or even possibly early Fournier's gangrene given his history of diabetes and location of the infection.  Obtain broad work-up, provided broad-spectrum antibiotics, fluid resuscitation.  Obtain CT scan for further assessment.  Labs noted for leukocytosis.  No renal dysfunction.  No electrolyte derangement.  CT scan was obtained, significant soft tissue edema noted in the scrotum but no abscess identified.  No gas noted.  I independently reviewed and interpreted CT results.  Agree with radiology report.  I discussed the findings with Dr. Alyson Ingles on-call for urology.  He reviewed case with me in detail, he advises admitting to the medicine service and their team will consult on patient, request patient be admitted to [facility].  Have paged hospitalist service to request admission.  Reviewed last PCP visit.  Does have history of diabetes, COPD, smoker.         Final Clinical Impression(s) / ED Diagnoses Final diagnoses:  None    Rx / DC Orders ED Discharge Orders     None         Lucrezia Starch, MD 06/18/22 [phone]

## 2022-06-19 ENCOUNTER — Other Ambulatory Visit: Payer: Self-pay

## 2022-06-19 DIAGNOSIS — Z7951 Long term (current) use of inhaled steroids: Secondary | ICD-10-CM | POA: Diagnosis not present

## 2022-06-19 DIAGNOSIS — Z923 Personal history of irradiation: Secondary | ICD-10-CM | POA: Diagnosis not present

## 2022-06-19 DIAGNOSIS — E785 Hyperlipidemia, unspecified: Secondary | ICD-10-CM | POA: Diagnosis present

## 2022-06-19 DIAGNOSIS — Z7984 Long term (current) use of oral hypoglycemic drugs: Secondary | ICD-10-CM | POA: Diagnosis not present

## 2022-06-19 DIAGNOSIS — N4889 Other specified disorders of penis: Secondary | ICD-10-CM | POA: Diagnosis present

## 2022-06-19 DIAGNOSIS — A419 Sepsis, unspecified organism: Secondary | ICD-10-CM | POA: Diagnosis present

## 2022-06-19 DIAGNOSIS — N492 Inflammatory disorders of scrotum: Secondary | ICD-10-CM | POA: Diagnosis present

## 2022-06-19 DIAGNOSIS — K8020 Calculus of gallbladder without cholecystitis without obstruction: Secondary | ICD-10-CM | POA: Diagnosis present

## 2022-06-19 DIAGNOSIS — R000 Tachycardia, unspecified: Secondary | ICD-10-CM | POA: Diagnosis not present

## 2022-06-19 DIAGNOSIS — Z9221 Personal history of antineoplastic chemotherapy: Secondary | ICD-10-CM | POA: Diagnosis not present

## 2022-06-19 DIAGNOSIS — F32A Depression, unspecified: Secondary | ICD-10-CM | POA: Diagnosis present

## 2022-06-19 DIAGNOSIS — Z800 Family history of malignant neoplasm of digestive organs: Secondary | ICD-10-CM | POA: Diagnosis not present

## 2022-06-19 DIAGNOSIS — J432 Centrilobular emphysema: Secondary | ICD-10-CM | POA: Diagnosis present

## 2022-06-19 DIAGNOSIS — F419 Anxiety disorder, unspecified: Secondary | ICD-10-CM | POA: Diagnosis present

## 2022-06-19 DIAGNOSIS — Z79899 Other long term (current) drug therapy: Secondary | ICD-10-CM | POA: Diagnosis not present

## 2022-06-19 DIAGNOSIS — Z933 Colostomy status: Secondary | ICD-10-CM | POA: Diagnosis not present

## 2022-06-19 DIAGNOSIS — Z833 Family history of diabetes mellitus: Secondary | ICD-10-CM | POA: Diagnosis not present

## 2022-06-19 DIAGNOSIS — E1140 Type 2 diabetes mellitus with diabetic neuropathy, unspecified: Secondary | ICD-10-CM | POA: Diagnosis present

## 2022-06-19 DIAGNOSIS — Z825 Family history of asthma and other chronic lower respiratory diseases: Secondary | ICD-10-CM | POA: Diagnosis not present

## 2022-06-19 DIAGNOSIS — Z1509 Genetic susceptibility to other malignant neoplasm: Secondary | ICD-10-CM | POA: Diagnosis not present

## 2022-06-19 DIAGNOSIS — D72829 Elevated white blood cell count, unspecified: Secondary | ICD-10-CM | POA: Diagnosis not present

## 2022-06-19 DIAGNOSIS — F17210 Nicotine dependence, cigarettes, uncomplicated: Secondary | ICD-10-CM | POA: Diagnosis present

## 2022-06-19 DIAGNOSIS — R911 Solitary pulmonary nodule: Secondary | ICD-10-CM | POA: Diagnosis present

## 2022-06-19 DIAGNOSIS — E1169 Type 2 diabetes mellitus with other specified complication: Secondary | ICD-10-CM | POA: Diagnosis present

## 2022-06-19 DIAGNOSIS — Z85048 Personal history of other malignant neoplasm of rectum, rectosigmoid junction, and anus: Secondary | ICD-10-CM | POA: Diagnosis not present

## 2022-06-19 LAB — COMPREHENSIVE METABOLIC PANEL
ALT: 20 U/L (ref 0–44)
AST: 16 U/L (ref 15–41)
Albumin: 2.5 g/dL — ABNORMAL LOW (ref 3.5–5.0)
Alkaline Phosphatase: 50 U/L (ref 38–126)
Anion gap: 5 (ref 5–15)
BUN: 6 mg/dL (ref 6–20)
CO2: 26 mmol/L (ref 22–32)
Calcium: 8 mg/dL — ABNORMAL LOW (ref 8.9–10.3)
Chloride: 109 mmol/L (ref 98–111)
Creatinine, Ser: 0.91 mg/dL (ref 0.61–1.24)
GFR, Estimated: >60 mL/min (ref >60)
Glucose, Bld: 139 mg/dL — ABNORMAL HIGH (ref 70–99)
Potassium: 3.8 mmol/L (ref 3.5–5.1)
Sodium: 140 mmol/L (ref 135–145)
Total Bilirubin: 0.7 mg/dL (ref 0.3–1.2)
Total Protein: 5.5 g/dL — ABNORMAL LOW (ref 6.5–8.1)

## 2022-06-19 LAB — GLUCOSE, CAPILLARY
Glucose-Capillary: 139 mg/dL — ABNORMAL HIGH (ref 70–99)
Glucose-Capillary: 131 mg/dL — ABNORMAL HIGH (ref 70–99)

## 2022-06-19 LAB — CBC
HCT: 43.3 % (ref 39.0–52.0)
Hemoglobin: 14.1 g/dL (ref 13.0–17.0)
MCH: 28.7 pg (ref 26.0–34.0)
MCHC: 32.6 g/dL (ref 30.0–36.0)
MCV: 88.2 fL (ref 80.0–100.0)
Platelets: 124 10*3/uL — ABNORMAL LOW (ref 150–400)
RBC: 4.91 MIL/uL (ref 4.22–5.81)
RDW: 15 % (ref 11.5–15.5)
WBC: 13 10*3/uL — ABNORMAL HIGH (ref 4.0–10.5)
nRBC: 0 % (ref 0.0–0.2)

## 2022-06-19 LAB — HEMOGLOBIN A1C
Hgb A1c MFr Bld: 7.2 % — ABNORMAL HIGH (ref 4.8–5.6)
Hgb A1c MFr Bld: 7.2 % — ABNORMAL HIGH (ref 4.8–5.6)
Mean Plasma Glucose: 159.94 mg/dL
Mean Plasma Glucose: 159.94 mg/dL

## 2022-06-19 LAB — C-REACTIVE PROTEIN
CRP: 24.5 mg/dL — ABNORMAL HIGH (ref <1.0)
CRP: 25 mg/dL — ABNORMAL HIGH (ref <1.0)

## 2022-06-19 LAB — HIV ANTIBODY (ROUTINE TESTING W REFLEX): HIV Screen 4th Generation wRfx: NONREACTIVE

## 2022-06-19 LAB — SEDIMENTATION RATE
Sed Rate: 44 mm/hr — ABNORMAL HIGH (ref 0–16)
Sed Rate: 50 mm/hr — ABNORMAL HIGH (ref 0–16)

## 2022-06-19 LAB — PROTIME-INR
INR: 1.3 — ABNORMAL HIGH (ref 0.8–1.2)
INR: 1.3 — ABNORMAL HIGH (ref 0.8–1.2)
Prothrombin Time: 16.2 seconds — ABNORMAL HIGH (ref 11.4–15.2)
Prothrombin Time: 15.9 seconds — ABNORMAL HIGH (ref 11.4–15.2)

## 2022-06-19 MED ORDER — METFORMIN HCL 500 MG PO TABS: 500.0000 mg | ORAL_TABLET | Freq: Two times a day (BID) | ORAL | Status: DC

## 2022-06-19 MED ORDER — METRONIDAZOLE 500 MG/100ML IV SOLN
500.0000 mg | Freq: Once | INTRAVENOUS | Status: AC
  Administered 2022-06-19: 500 mg via INTRAVENOUS
  Filled 2022-06-19: qty 100

## 2022-06-19 MED ORDER — ATORVASTATIN CALCIUM 20 MG PO TABS
20.0000 mg | ORAL_TABLET | Freq: Every day | ORAL | Status: DC
  Administered 2022-06-19 – 2022-06-20 (×2): 20 mg via ORAL
  Filled 2022-06-19 (×2): qty 1

## 2022-06-19 MED ORDER — LACTATED RINGERS IV SOLN: INTRAVENOUS | Status: AC

## 2022-06-19 MED ORDER — SERTRALINE HCL 100 MG PO TABS
100.0000 mg | ORAL_TABLET | Freq: Every day | ORAL | Status: DC
  Administered 2022-06-19 – 2022-06-20 (×2): 100 mg via ORAL
  Filled 2022-06-19: qty 4
  Filled 2022-06-19: qty 1

## 2022-06-19 MED ORDER — IPRATROPIUM-ALBUTEROL 0.5-2.5 (3) MG/3ML IN SOLN: 3.0000 mL | Freq: Once | RESPIRATORY_TRACT | Status: AC

## 2022-06-19 MED ORDER — ACETAMINOPHEN 325 MG PO TABS
650.0000 mg | ORAL_TABLET | Freq: Once | ORAL | Status: AC
  Administered 2022-06-19: 650 mg via ORAL
  Filled 2022-06-19: qty 2

## 2022-06-19 MED ORDER — ALBUTEROL SULFATE (2.5 MG/3ML) 0.083% IN NEBU
INHALATION_SOLUTION | RESPIRATORY_TRACT | Status: AC
  Administered 2022-06-19: 2.5 mg via RESPIRATORY_TRACT
  Filled 2022-06-19: qty 3

## 2022-06-19 MED ORDER — IPRATROPIUM-ALBUTEROL 0.5-2.5 (3) MG/3ML IN SOLN
3.0000 mL | Freq: Once | RESPIRATORY_TRACT | Status: AC
Start: 2022-06-19 — End: 2022-06-19

## 2022-06-19 MED ORDER — IPRATROPIUM-ALBUTEROL 0.5-2.5 (3) MG/3ML IN SOLN
RESPIRATORY_TRACT | Status: AC
  Administered 2022-06-19: 3 mL via RESPIRATORY_TRACT
  Filled 2022-06-19: qty 3

## 2022-06-19 MED ORDER — MOMETASONE FURO-FORMOTEROL FUM 200-5 MCG/ACT IN AERO
2.0000 | INHALATION_SPRAY | Freq: Two times a day (BID) | RESPIRATORY_TRACT | Status: DC
  Administered 2022-06-19 – 2022-06-20 (×2): 2 via RESPIRATORY_TRACT
  Filled 2022-06-19: qty 8.8

## 2022-06-19 MED ORDER — ACETAMINOPHEN 500 MG PO TABS: 1000.0000 mg | ORAL_TABLET | Freq: Four times a day (QID) | ORAL | Status: DC | PRN

## 2022-06-19 MED ORDER — SODIUM CHLORIDE 0.9 % IV SOLN
INTRAVENOUS | Status: DC | PRN
  Administered 2022-06-19: 10 mL/h via INTRAVENOUS

## 2022-06-19 MED ORDER — HYDROMORPHONE HCL 1 MG/ML IJ SOLN
1.0000 mg | INTRAMUSCULAR | Status: DC | PRN
  Administered 2022-06-19 – 2022-06-20 (×4): 1 mg via INTRAVENOUS
  Filled 2022-06-19 (×4): qty 1

## 2022-06-19 MED ORDER — HEPARIN SODIUM (PORCINE) 5000 UNIT/ML IJ SOLN
5000.0000 [IU] | Freq: Three times a day (TID) | INTRAMUSCULAR | Status: DC
  Administered 2022-06-19 – 2022-06-20 (×2): 5000 [IU] via SUBCUTANEOUS
  Filled 2022-06-19 (×2): qty 1

## 2022-06-19 MED ORDER — UMECLIDINIUM BROMIDE 62.5 MCG/ACT IN AEPB
1.0000 | INHALATION_SPRAY | Freq: Every day | RESPIRATORY_TRACT | Status: DC
  Administered 2022-06-19: 1 via RESPIRATORY_TRACT
  Filled 2022-06-19: qty 7

## 2022-06-19 MED ORDER — HYDROMORPHONE HCL 1 MG/ML IJ SOLN: 0.4000 mg | Freq: Once | INTRAMUSCULAR | Status: DC

## 2022-06-19 MED ORDER — GABAPENTIN 300 MG PO CAPS
600.0000 mg | ORAL_CAPSULE | Freq: Three times a day (TID) | ORAL | Status: DC
  Administered 2022-06-19 – 2022-06-20 (×3): 600 mg via ORAL
  Filled 2022-06-19 (×3): qty 2

## 2022-06-19 MED ORDER — ORAL CARE MOUTH RINSE
15.0000 mL | OROMUCOSAL | Status: DC | PRN
Start: 2022-06-19 — End: 2022-06-20

## 2022-06-19 MED ORDER — OXYCODONE HCL 5 MG PO TABS
2.5000 mg | ORAL_TABLET | ORAL | Status: DC | PRN
  Administered 2022-06-19: 5 mg via ORAL
  Filled 2022-06-19: qty 1

## 2022-06-19 MED ORDER — GABAPENTIN 100 MG PO CAPS
100.0000 mg | ORAL_CAPSULE | Freq: Three times a day (TID) | ORAL | Status: DC
Start: 2022-06-19 — End: 2022-06-19
  Administered 2022-06-19: 100 mg via ORAL
  Filled 2022-06-19: qty 1

## 2022-06-19 MED ORDER — MORPHINE SULFATE (PF) 4 MG/ML IV SOLN
4.0000 mg | Freq: Once | INTRAVENOUS | Status: AC
  Administered 2022-06-19: 4 mg via INTRAVENOUS
  Filled 2022-06-19: qty 1

## 2022-06-19 MED ORDER — OXYCODONE HCL 5 MG PO TABS
5.0000 mg | ORAL_TABLET | ORAL | Status: DC | PRN
  Administered 2022-06-19 – 2022-06-20 (×2): 5 mg via ORAL
  Filled 2022-06-19 (×2): qty 1

## 2022-06-19 MED ORDER — ALBUTEROL SULFATE (2.5 MG/3ML) 0.083% IN NEBU: 2.5000 mg | INHALATION_SOLUTION | Freq: Once | RESPIRATORY_TRACT | Status: AC

## 2022-06-19 MED ORDER — FENTANYL CITRATE PF 50 MCG/ML IJ SOSY
50.0000 ug | PREFILLED_SYRINGE | Freq: Once | INTRAMUSCULAR | Status: AC
  Administered 2022-06-19: 50 ug via INTRAVENOUS
  Filled 2022-06-19: qty 1

## 2022-06-19 MED ORDER — LIDOCAINE HCL 2 % IJ SOLN
15.0000 mL | Freq: Once | INTRAMUSCULAR | Status: DC
  Filled 2022-06-19: qty 20

## 2022-06-19 MED ORDER — INSULIN ASPART 100 UNIT/ML IJ SOLN: 0.0000 [IU] | Freq: Three times a day (TID) | INTRAMUSCULAR | Status: DC

## 2022-06-19 MED ORDER — TIOTROPIUM BROMIDE MONOHYDRATE 2.5 MCG/ACT IN AERS
2.0000 | INHALATION_SPRAY | Freq: Every day | RESPIRATORY_TRACT | Status: DC
Start: 2022-06-19 — End: 2022-06-19

## 2022-06-19 MED ORDER — ALBUTEROL SULFATE (2.5 MG/3ML) 0.083% IN NEBU: 2.5000 mg | INHALATION_SOLUTION | RESPIRATORY_TRACT | Status: DC | PRN

## 2022-06-19 NOTE — ED Notes (Signed)
Pt given urinal. NAD, denies other needs.

## 2022-06-19 NOTE — ED Notes (Signed)
Pt called out c/o SOB. On RN and RT Abigail Butts arrival to room pt was standing bent over stretcher stating that he suddenly woke up feeling SOB. Was noted RR 30-40s, HR 120-130s, and SpO2 89-99%. EKG was obtained. RT assessing at bedside. EDP informed of events.

## 2022-06-19 NOTE — ED Notes (Signed)
RT note: Pt. seen for follow-up from 7p-7a shift, currently pending admission/awaiting [facility] for transfer, has COPD hx., currently not on Oxygen, was given Med. Aerosol during previous shift and currently resting comfortably in "no distress" with family member at bedside, placed personal fan in room per temperature being "little warm", made aware to notify with call bell if needed, RT to monitor.

## 2022-06-19 NOTE — H&P (Signed)
History and Physical    [name] JOI:[id number] DOB: 1971-05-05 DOA: 06/18/2022  PCP: Alcus Dad, MD   Patient coming from: home  Chief Complaint: scrotal pain and swelling and infxn  HPI: [name] is a 51 y.o. male with a pertinent history of   history of COPD, Lynch syndrome and rectal adenocarcinoma s/p chemoradiation and colon resection and proctocolectomy currently in remission presenting to [place] emergency department with complaints of worsening scrotal swelling and pain over the past 3 days of his left testicle and then subsequently spread to his penis with developing fevers and chills today and presented to ED.  He does manscape he thinks.  No other injuries or inciting events.    In the emergency department, initially 139/85, HR 122 which improved to 84 with hydration, Tmax of 101.2 satting okay on oxygen.  WBC 18.8, Hgb 15.7, PLT 154, NA 135, K4.0, CO2 26, SCR 1.12, glucose 114, lactic acid 1.8  Upon evaluation in the emergency department ultrasound of the scrotum revealed evidence of left groin inflammation without evidence of torsion epididymitis or orchitis.  CT imaging of the abdomen and pelvis was additionally performed revealing left scrotal subcutaneous soft tissue edema.  Received some nebulizers in the emergency department.  Case was discussed with Dr. Alyson Ingles, urology and said they would consult.   Review of Systems: As per HPI otherwise 10 point review of systems negative.  Other pertinents as below:  General - denies any recent illness or other places of infection HEENT - denies any new HA's or visual changes Cardio - denies cp, palpitatoins Resp - has chronic cough, denies new sob GI - denies n/v/d/GI pain GU - as per hpi, denies dysuria, penile discharge MSK - denies new joint or back pain Skin - denies other rashes or lesions Neuro - denies new numbness or weakness Psych -  denies new anxiety or depression, but has been feelin ganxious and  is to talk to PCP about this.  Past Medical History:  Diagnosis Date   Anxiety    C. 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 In families with MSH6 mutations, screening can start at age 20 years since the onset of colon cancer is later in these families.  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    Past Surgical History:  Procedure Laterality Date   COLON SURGERY     EUS N/A 07/29/2016   Procedure: LOWER ENDOSCOPIC ULTRASOUND (EUS);  Surgeon: Milus Banister, MD;  Location: [facility];  Service: Endoscopy;  Laterality: N/A;   ILEOSTOMY CLOSURE N/A 09/22/2017   Procedure: TAKEDOWN OF LOOP ILEOSTOMY;  Surgeon: Michael Boston, MD;  Location: [facility];  Service: General;  Laterality: N/A;   IR REMOVAL TUN ACCESS W/ PORT W/O FL MOD SED  08/27/2021   LYSIS OF ADHESION N/A 05/10/2018   Procedure:  LYSIS OF ADHESION;  Surgeon: Michael Boston, MD;  Location: [facility];  Service: General;  Laterality: N/A;   PORTACATH PLACEMENT N/A 01/26/2017   Procedure: PLACEMENT OF PORT-A-CATH CENTRAL LINE WITH FLUOROSCOPY AND ULTRASOUND;  Surgeon: Michael Boston, MD;  Location: [facility];  Service: General;  Laterality: N/A;   POUCHOSCOPY N/A 09/21/2017   Procedure: POUCHOSCOPY;  Surgeon: Leighton Ruff, MD;  Location: [facility];  Service: Endoscopy;  Laterality:  N/A;   POUCHOSCOPY N/A 01/12/2018   Procedure: POUCHOSCOPY WITH BIOPSIES;  Surgeon: Leighton Ruff, MD;  Location: [facility];  Service: Endoscopy;  Laterality: N/A;  Local   PROCTOSCOPY N/A 12/01/2016   Procedure: RIGID PROCTOSCOPY;  Surgeon: Michael Boston, MD;  Location: [facility];  Service: General;  Laterality: N/A;   RECTAL EXAM UNDER ANESTHESIA N/A 05/10/2018   Procedure: RECTAL EXAM UNDER ANESTHESIA;  Surgeon: Michael Boston, MD;  Location: [facility];  Service: General;  Laterality: N/A;   TOE AMPUTATION Left    XI ROBOTIC ASSISTED LOWER ANTERIOR RESECTION N/A 12/01/2016   Procedure: XI ROBOTIC ASSISTED PROCTOCOLECTOMY WITH ILLEOPOUCH ANASTAMOSIS WITH DIVERTING ILLEOSTOMY;  Surgeon: Michael Boston, MD;  Location: [facility];  Service: General;  Laterality: N/A;   XI ROBOTIC ASSISTED LOWER ANTERIOR RESECTION N/A 05/10/2018   Procedure: XI ROBOTIC RESECTION OF ILEAL J POUCH, LYSIS OF ADHESIONS, WITH CREATION OF PERMANENT END ILEOSTOMY.;  Surgeon: Michael Boston, MD;  Location: [facility];  Service: General;  Laterality: N/A;     reports that he has been smoking cigarettes. He started smoking about 36 years ago. He has a 33.00 pack-year smoking history. He has never used smokeless tobacco. He reports that he does not drink alcohol and does not use drugs.  Allergies  Allergen Reactions   Hydroxyzine Other (See Comments)    Made the patient feel not like himself    Family History  Problem Relation Age of Onset   Diabetes Mother    COPD Father    Colon cancer Maternal Aunt        dx in her 73s   Diabetes Maternal Grandmother    Brain cancer Maternal Grandfather    Lung cancer Paternal Grandfather    Colon cancer Cousin        dx in her 2s   Bone cancer Sister 8   Pancreatic cancer Neg Hx    Esophageal cancer Neg Hx    Stomach cancer Neg Hx    Liver disease Neg Hx     Prior to Admission medications   Medication Sig Start Date End Date Taking? Authorizing Provider  acetaminophen (TYLENOL) 500 MG  tablet Take 1,000 mg by mouth every 6 (six) hours as needed for fever or mild pain.   Yes [provider]  albuterol (PROVENTIL) (2.5 MG/3ML) 0.083% nebulizer solution Take 3 mLs (2.5 mg total) by nebulization every 6 (six) hours as needed for wheezing or shortness of breath. 07/15/21  Yes Simmons-Robinson, Makiera, MD  albuterol (VENTOLIN HFA) 108 (90 Base) MCG/ACT inhaler Inhale 2 puffs into the lungs every 4 (four) hours as needed for wheezing or shortness of breath. 01/26/22  Yes Margaretha Seeds, MD  atorvastatin (LIPITOR) 20 MG tablet TAKE 1 TABLET BY MOUTH EVERY DAY Patient taking differently: Take 20 mg by mouth daily. 10/26/21  Yes Alcus Dad, MD  budesonide-formoterol [facility]) 160-4.5 MCG/ACT inhaler Inhale 2 puffs into the lungs 2 (two) times daily. in the morning and at bedtime. 01/26/22  Yes Margaretha Seeds, MD  gabapentin (NEURONTIN) 300 MG capsule  TAKE 2 CAPSULES BY MOUTH 3 TIMES DAILY. Patient taking differently: Take 600 mg by mouth 3 (three) times daily. 01/27/22  Yes Espinoza, Dawson Bills, DO  metFORMIN (GLUCOPHAGE) 500 MG tablet TAKE 1 TABLET BY MOUTH EVERY MORNING AND AT BEDTIME Patient taking differently: Take 500 mg by mouth in the morning and at bedtime. 12/25/21  Yes Alcus Dad, MD  sertraline (ZOLOFT) 100 MG tablet TAKE 1 TABLET BY MOUTH EVERY DAY Patient taking differently: Take 100 mg by mouth in the morning. 06/09/21  Yes Meccariello, Bernita Raisin, DO  Tiotropium Bromide Monohydrate (SPIRIVA RESPIMAT) 2.5 MCG/ACT AERS Inhale 2 puffs into the lungs daily. 01/26/22  Yes Margaretha Seeds, MD  Accu-Chek Softclix Lancets lancets Use as instructed 09/25/20   Meccariello, Bernita Raisin, DO  glucose blood (ACCU-CHEK GUIDE) test strip Use to check blood sugar up to 4 times daily 12/08/21   Alcus Dad, MD    Physical Exam: Vitals:   06/19/22 1304 06/19/22 1705 06/19/22 1951 06/19/22 2005  BP: 110/74 130/85  (!) 132/92  Pulse: 84 99  94  Resp:  18  (!) 22  Temp: 98 F  (36.7 C) 98.1 F (36.7 C)  98.3 F (36.8 C)  TempSrc: Oral Oral  Oral  SpO2: 97% 97% 94% 93%  Weight: 73.3 kg     Height: $Remove'5\' 11"'tnCtRvq$  (1.803 m)       Constitutional: NAD, comfortable Eyes: pupils equal and reactive to light, anicteric, without injection ENMT: MMM, throat without exudates or erythema Neck: normal, supple, no masses, no thyromegaly noted Respiratory: CTAB, nwob, "smokers" cough at bedside  Cardiovascular: rrr w/o mrg, warm extremities Abdomen: NBS, NT,   Musculoskeletal: moving all 4 extremities, strength grossly intact 5/5 in the UE and LE's,   GU: place of induration and comes to a head on left scrotum Skin: no rashes, lesions, ulcers. No induration Neurologic: CN 2-12 grossly intact. Sensation intact Psychiatric: AO appearing, mentation appropriate  Labs on Admission: I have personally reviewed following labs and imaging studies  CBC: Recent Labs  Lab 06/18/22 2056  WBC 18.8*  NEUTROABS 15.8*  HGB 15.7  HCT 47.4  MCV 86.5  PLT 449   Basic Metabolic Panel: Recent Labs  Lab 06/18/22 2056  NA 135  K 4.0  CL 102  CO2 26  GLUCOSE 114*  BUN 8  CREATININE 1.12  CALCIUM 9.0   GFR: Estimated Creatinine Clearance: 80 mL/min (by C-G formula based on SCr of 1.12 mg/dL). Liver Function Tests: Recent Labs  Lab 06/18/22 2056  AST 22  ALT 25  ALKPHOS 49  BILITOT 0.7  PROT 6.4*  ALBUMIN 3.6   No results for input(s): "LIPASE", "AMYLASE" in the last 168 hours. No results for input(s): "AMMONIA" in the last 168 hours. Coagulation Profile: Recent Labs  Lab 06/19/22 1605  INR 1.3*   Cardiac Enzymes: No results for input(s): "CKTOTAL", "CKMB", "CKMBINDEX", "TROPONINI" in the last 168 hours. BNP (last 3 results) No results for input(s): "PROBNP" in the last 8760 hours. HbA1C: No results for input(s): "HGBA1C" in the last 72 hours. CBG: Recent Labs  Lab 06/19/22 1625  GLUCAP 139*   Lipid Profile: No results for input(s): "CHOL", "HDL",  "LDLCALC", "TRIG", "CHOLHDL", "LDLDIRECT" in the last 72 hours. Thyroid Function Tests: No results for input(s): "TSH", "T4TOTAL", "FREET4", "T3FREE", "THYROIDAB" in the last 72 hours. Anemia Panel: No results for input(s): "VITAMINB12", "FOLATE", "FERRITIN", "TIBC", "IRON", "RETICCTPCT" in the last 72 hours. Urine analysis:    Component Value Date/Time   COLORURINE  YELLOW 06/18/2022 [place] 06/18/2022 1815   LABSPEC 1.011 06/18/2022 1815   PHURINE 6.0 06/18/2022 1815   GLUCOSEU NEGATIVE 06/18/2022 1815   HGBUR NEGATIVE 06/18/2022 1815   BILIRUBINUR NEGATIVE 06/18/2022 1815   BILIRUBINUR large (A) 07/25/2018 1441   KETONESUR NEGATIVE 06/18/2022 1815   PROTEINUR NEGATIVE 06/18/2022 1815   UROBILINOGEN 0.2 07/25/2018 1441   NITRITE NEGATIVE 06/18/2022 1815   Zinc 06/18/2022 1815    Radiological Exams on Admission: CT ABDOMEN PELVIS W CONTRAST  Result Date: 06/18/2022 CLINICAL DATA:  Lymphadenopathy, groin Abdominal pain, acute, nonlocalized scan through scrotum - has severe scrotal swelling and erythema, concern for necrotizing fascititis. History of rectal cancer. EXAM: CT ABDOMEN AND PELVIS WITH CONTRAST TECHNIQUE: Multidetector CT imaging of the abdomen and pelvis was performed using the standard protocol following bolus administration of intravenous contrast. RADIATION DOSE REDUCTION: This exam was performed according to the departmental dose-optimization program which includes automated exposure control, adjustment of the mA and/or kV according to patient size and/or use of iterative reconstruction technique. CONTRAST:  127mL OMNIPAQUE IOHEXOL 300 MG/ML  SOLN COMPARISON:  CT chest 08/05/2021, ultrasound scrotum 06/18/2022, CT abdomen pelvis 08/05/2021 FINDINGS: Lower chest: Severe emphysematous changes. Interval development of spiculated left lower lobe 1.2 x 1.1 cm pulmonary nodule. Hepatobiliary: No focal liver abnormality. Calcified gallstone noted  within the gallbladder lumen. No gallbladder wall thickening or pericholecystic fluid. No biliary dilatation. Pancreas: No focal lesion. Normal pancreatic contour. No surrounding inflammatory changes. No main pancreatic ductal dilatation. Spleen: Normal in size without focal abnormality.  Splenule noted. Adrenals/Urinary Tract: No adrenal nodule bilaterally. Bilateral kidneys enhance symmetrically. No hydronephrosis. No hydroureter. The urinary bladder is unremarkable. Stomach/Bowel: Surgical changes related to a lower anterior resection and right abdomen ileostomy formation. Stomach is within normal limits. No evidence of bowel wall thickening or dilatation. Appendix appears normal. Vascular/Lymphatic: No abdominal aorta or iliac aneurysm. Mild atherosclerotic plaque of the aorta and its branches. No abdominal, pelvic, or inguinal lymphadenopathy. Reproductive: Prostate is unremarkable. Left scrotal subcutaneus soft tissue edema. No definite organized fluid collection. No subcutaneus soft tissue emphysema. Other: Grossly similar-appearing presacral soft tissue density wavy related to surgical and radiation changes. No intraperitoneal free fluid. No intraperitoneal free gas. No organized fluid collection. Musculoskeletal: No abdominal wall hernia or abnormality. No suspicious lytic or blastic osseous lesions. No acute displaced fracture. 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 IMPRESSION: 1. Left scrotal subcutaneus soft tissue edema. No subcutaneus soft tissue emphysema or definite organized fluid collection. Please note necrotizing fasciitis cannot be excluded as this is a clinical diagnosis. Please see separately dictated ultrasound scrotum 06/18/2022. 2. Interval development of spiculated left lower lobe 1.2 x 1.1 cm pulmonary nodule. Finding could represent metastasis in the setting of prior rectal cancer versus primary lung malignancy. 3. Nonspecific enlarged 1 cm  left periaortic lymph node. Recommend attention on follow-up. 4. Cholelithiasis with no CT finding of acute cholecystitis. 5. Surgical changes related to a lower anterior resection and right abdomen ileostomy formation. 6. Emphysema (ICD10-J43.9). Electronically Signed   By: Iven Finn M.D.   On: 06/18/2022 23:03   US SCROTUM W/DOPPLER  Result Date: 06/18/2022 CLINICAL DATA:  Left groin swelling and pain for 3 days. EXAM: SCROTAL ULTRASOUND DOPPLER ULTRASOUND OF THE TESTICLES TECHNIQUE: Complete ultrasound examination of the testicles, epididymis, and other scrotal structures was performed. Color and spectral Doppler ultrasound were also utilized to evaluate blood flow to the testicles. COMPARISON:  None Available.  FINDINGS: Right testicle Measurements: 3.4 x 1.7 x 2.5 cm. No mass or microlithiasis visualized. Left testicle Measurements: 4.1 x 2 x 2 cm. No mass or microlithiasis visualized. Right epididymis:  Normal in size and appearance. Left epididymis:  Normal in size and appearance. Hydrocele:  None visualized. Varicocele:  None visualized. Pulsed Doppler interrogation of both testes demonstrates normal low resistance arterial and venous waveforms bilaterally. Soft tissues: Left groin superficial soft tissues are edematous and hypervascular IMPRESSION: 1. Left groin soft tissue inflammation reflected sonographically by edema and hypervascularity. No mass or fluid collection. 2. No testicular mass or torsion. No evidence of epididymitis/orchitis. Electronically Signed   By: Lajean Manes M.D.   On: 06/18/2022 20:26    EKG: Independently reviewed.   Assessment/Plan Principal Problem:   Cellulitis of scrotum  Sepsis --cont cef and vanc, consider adding clinda but no reported crepitus, getting ESR and CRP, urology to consult --f/u blood cx's --continue broad spectrum abx --there is thought to be a small abscess but thought not warranted at this time to surgerize/procedure, per urology, so  ctm --pain control with oxycodone and gave one dose of 0.4 dilaudid  -COPD-continue home inhalers of Spiriva, Symbicort, albuterol as needed, encourage smoking cessation - Tobacco use- declines patch -Diabetes-SSI, hold home orals - Mood-sertraline 100 mg -HLD-atorvastatin 20 mg  Lung nodule needs outpatient follow up Gallstones on imaging  Patient and/or Family completely agreed with the plan, expressed understanding and I answered all questions.  DVT prophylaxis: Heparin SQ Code Status: Full code Family Communication: wife in room Disposition Plan: home when transitioned to something oral successfully in a few days Consults called: urology, [place]   Admission status: inpatient     A total of 78 minutes utilized during this admission.  [facility]   If 7PM-7AM, please contact night-coverage [email or web address] Password [facility]  06/19/2022, 8:47 PM

## 2022-06-19 NOTE — ED Notes (Signed)
Pt signed Consent to transfer form.

## 2022-06-19 NOTE — ED Notes (Signed)
Bed placement just notified us of ready bed at [facility]; and I in turn notify [facility] of this.

## 2022-06-19 NOTE — ED Provider Notes (Signed)
Patient is still waiting for a bed at [facility].  I rechecked his groin area.  He has some diffuse swelling to the scrotum with some induration on the left side.  No crepitus.  Per his wife, she feels like it is looking better.  The redness appears to be localized to the scrotal sac without extension into the perineum.  His heart rate is improving.  Antibiotics are continued.  He had a CT scan which does not show any evidence of Fournier's gangrene or gas in the tissues.  We will continue to closely monitor.   Malvin Johns, MD 06/19/22 1018

## 2022-06-19 NOTE — Progress Notes (Signed)
Plan of Care Note for accepted transfer   Patient: [name] MRN: [id number]   [place]: 06/18/2022  Facility requesting transfer: [facility] Requesting Provider: Dr. Roslynn Amble Reason for transfer: Scrotal cellulitis Facility course:   51 year old male with past medical history of COPD, Lynch syndrome and rectal adenocarcinoma currently in remission presenting to [facility] emergency department with complaints of worsening scrotal swelling and pain over the past 3 days.  Upon evaluation in the emergency department ultrasound of the scrotum revealed evidence of left groin inflammation without evidence of torsion epididymitis or orchitis.  CT imaging of the abdomen and pelvis was additionally performed revealing left scrotal subcutaneous soft tissue edema.  Please note there is an incidental finding of a spiculated left lower lobe pulmonary nodule that needs to be addressed.  Considering the patient's clinical exam findings and CT imaging ER provider discussed case with Dr. Alyson Ingles with urology who reviewed images of the scrotum (under the media tab) in addition to the radiology and did not feel that the patient was suffering from Fournier's gangrene.  They recommended initiation of intravenous antibiotics, admission to medicine with urology consult.  Patient has been administered intravenous vancomycin, cefepime and Flagyl with 30 cc/kg of isotonic fluids administered.    Plan of care: The patient is accepted for admission to Telemetry unit, at [facility]..    Author: Vernelle Emerald, MD 06/19/2022  Check [email or web address] for on-call coverage.  Nursing staff, Please call [place] number on Amion as soon as patient's arrival, so appropriate admitting provider can evaluate the pt.

## 2022-06-19 NOTE — ED Notes (Signed)
RT called to assess pt for SOB. Pt at bedside in tripod position w/increased WOB and SOB. RT assessed pt and administered neb treatments. Pt has history of COPD and continues to smoke. Pt see Pulmonologist for his COPD currently. RT educated pt on the importance of smoking cessation in order to slow down his disease progression. (Also explained to him by pulmonologist per pt). Pt respiratory status stable at this time w/mild distress noted. RT will continue to monitor.

## 2022-06-19 NOTE — ED Notes (Signed)
RT note: Pt. given aerosol nebulizer '@1123'$  with Albuterol/Duoneb(5/0.'5mg'$ )per request, MD made aware prior with Secure Chat Msg., received same earlier this date'@0355hrs'$ ., remains awaiting [facility] for admission, no wheezing auscultated, uses Albuterol/Spiriva/Symbicort, RT to monitor.

## 2022-06-19 NOTE — ED Notes (Signed)
Pt resting with eyes closed, chest rise and fall noted. NAD, equal, unlabored RR.

## 2022-06-19 NOTE — ED Notes (Signed)
RT note: Meds: Dulera and Spiriva were not given-1125hrs., will be started @ [facility] upon admission.

## 2022-06-19 NOTE — ED Notes (Signed)
MD and RT bedside for pt having difficulty breathing. He sts that it woke him up from sleep and he felt like he couldn't catch his breath. Sts this happens to him several times at home.

## 2022-06-19 NOTE — Consult Note (Signed)
Urology Consult  Referring physician: Dr. Roslynn Amble Reason for referral: scrotal swelling, possible fourniers  Chief Complaint: left scrotal pain  History of Present Illness: [name] is a 51yo with a history of DMII who presented to [facility] yesterday with a 4 day history of left scrotal swelling. Starting Tuesday he noted left scrotal pain and swelling. For the past 2 days he has had fevers to 102 at home. In the ER is was found to have significant scrotal and penile swelling. CT was negative for gas and scrotal US did not show a fluid collection. The patient was then transferred to [facility] for management. He currently has improved penile and scrotal edema but he has significant left scrotal pain. No drainage from scrotum. WBC count 18. No difficulty urinating.   Past Medical History:  Diagnosis Date   Anxiety    C. 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 In families with MSH6 mutations, screening can start at age 34 years since the onset of colon cancer is later in these families.  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   Past Surgical History:  Procedure Laterality Date   COLON SURGERY     EUS N/A 07/29/2016   Procedure: LOWER ENDOSCOPIC ULTRASOUND (EUS);  Surgeon: Milus Banister, MD;  Location: [facility];  Service: Endoscopy;   Laterality: N/A;   ILEOSTOMY CLOSURE N/A 09/22/2017   Procedure: TAKEDOWN OF LOOP ILEOSTOMY;  Surgeon: Michael Boston, MD;  Location: [facility];  Service: General;  Laterality: N/A;   IR REMOVAL TUN ACCESS W/ PORT W/O FL MOD SED  08/27/2021   LYSIS OF ADHESION N/A 05/10/2018   Procedure: LYSIS OF ADHESION;  Surgeon: Michael Boston, MD;  Location: [facility];  Service: General;  Laterality: N/A;   PORTACATH PLACEMENT N/A 01/26/2017   Procedure: PLACEMENT OF PORT-A-CATH CENTRAL LINE WITH FLUOROSCOPY AND ULTRASOUND;  Surgeon: Michael Boston, MD;  Location: [place];  Service: General;  Laterality: N/A;   POUCHOSCOPY N/A 09/21/2017   Procedure: POUCHOSCOPY;  Surgeon: Leighton Ruff, MD;  Location: [facility];  Service: Endoscopy;  Laterality: N/A;   POUCHOSCOPY N/A 01/12/2018   Procedure: POUCHOSCOPY WITH BIOPSIES;  Surgeon: Leighton Ruff, MD;  Location: [facility];  Service: Endoscopy;  Laterality: N/A;  Local   PROCTOSCOPY N/A 12/01/2016   Procedure: RIGID PROCTOSCOPY;  Surgeon: Michael Boston, MD;  Location: [facility];  Service: General;  Laterality: N/A;   RECTAL EXAM UNDER ANESTHESIA N/A 05/10/2018   Procedure: RECTAL EXAM UNDER ANESTHESIA;  Surgeon: Michael Boston, MD;  Location: [facility];  Service: General;  Laterality: N/A;   TOE AMPUTATION Left    XI ROBOTIC ASSISTED LOWER ANTERIOR RESECTION N/A 12/01/2016   Procedure: XI ROBOTIC ASSISTED PROCTOCOLECTOMY WITH ILLEOPOUCH ANASTAMOSIS WITH DIVERTING ILLEOSTOMY;  Surgeon: Michael Boston, MD;  Location: [facility];  Service: General;  Laterality: N/A;   XI ROBOTIC  ASSISTED LOWER ANTERIOR RESECTION N/A 05/10/2018   Procedure: XI ROBOTIC RESECTION OF ILEAL J POUCH, LYSIS OF ADHESIONS, WITH CREATION OF PERMANENT END ILEOSTOMY.;  Surgeon: Michael Boston, MD;  Location: [facility];  Service: General;  Laterality: N/A;    Medications: I have reviewed the patient's current medications. Allergies:  Allergies  Allergen Reactions   Hydroxyzine Other (See Comments)     Made the patient feel not like himself    Family History  Problem Relation Age of Onset   Diabetes Mother    COPD Father    Colon cancer Maternal Aunt        dx in her 53s   Diabetes Maternal Grandmother    Brain cancer Maternal Grandfather    Lung cancer Paternal Grandfather    Colon cancer Cousin        dx in her 88s   Bone cancer Sister 8   Pancreatic cancer Neg Hx    Esophageal cancer Neg Hx    Stomach cancer Neg Hx    Liver disease Neg Hx    Social History:  reports that he has been smoking cigarettes. He started smoking about 36 years ago. He has a 33.00 pack-year smoking history. He has never used smokeless tobacco. He reports that he does not drink alcohol and does not use drugs.  Review of Systems  Constitutional:  Positive for fever.  Genitourinary:  Positive for penile pain, penile swelling, scrotal swelling and testicular pain.  All other systems reviewed and are negative.   Physical Exam:  Vital signs in last 24 hours: Temp:  [98 F (36.7 C)-101.2 F (38.4 C)] 98.1 F (36.7 C) (07/01 1705) Pulse Rate:  [84-127] 99 (07/01 1705) Resp:  [16-34] 18 (07/01 1705) BP: (100-142)/(74-101) 130/85 (07/01 1705) SpO2:  [87 %-98 %] 97 % (07/01 1705) Weight:  [73.3 kg-79.4 kg] 73.3 kg (07/01 1304) Physical Exam Vitals reviewed.  Constitutional:      Appearance: Normal appearance.  HENT:     Head: Normocephalic and atraumatic.     Nose: Nose normal. No congestion.     Mouth/Throat:     Mouth: Mucous membranes are dry.  Eyes:     Extraocular Movements: Extraocular movements intact.     Pupils: Pupils are equal, round, and reactive to light.  Cardiovascular:     Rate and Rhythm: Normal rate and regular rhythm.  Pulmonary:     Effort: Pulmonary effort is normal. No respiratory distress.  Abdominal:     General: Abdomen is flat. There is no distension.  Genitourinary:    Penis: Normal.      Testes:        Left: Tenderness and swelling present.     Comments:  4cm indurated area left hemiscortum concerning for abscess Musculoskeletal:        General: No swelling. Normal range of motion.     Cervical back: Normal range of motion and neck supple.  Skin:    General: Skin is warm and dry.  Neurological:     General: No focal deficit present.     Mental Status: He is alert and oriented to person, place, and time.  Psychiatric:        Mood and Affect: Mood normal.        Behavior: Behavior normal.        Thought Content: Thought content normal.        Judgment: Judgment normal.     Laboratory Data:  Results for orders placed or performed during the  hospital encounter of 06/18/22 (from the past 72 hour(s))  Urinalysis, Routine w reflex microscopic     Status: None   Collection Time: 06/18/22  6:15 PM  Result Value Ref Range   Color, Urine YELLOW YELLOW   APPearance CLEAR CLEAR   Specific Gravity, Urine 1.011 1.005 - 1.030   pH 6.0 5.0 - 8.0   Glucose, UA NEGATIVE NEGATIVE mg/dL   Hgb urine dipstick NEGATIVE NEGATIVE   Bilirubin Urine NEGATIVE NEGATIVE   Ketones, ur NEGATIVE NEGATIVE mg/dL   Protein, ur NEGATIVE NEGATIVE mg/dL   Nitrite NEGATIVE NEGATIVE   Leukocytes,Ua NEGATIVE NEGATIVE    Comment: Performed at [facility], [address] [place], [place] [address]  Lactic acid, plasma     Status: None   Collection Time: 06/18/22  8:56 PM  Result Value Ref Range   Lactic Acid, Venous 1.8 0.5 - 1.9 mmol/L    Comment: Performed at [facility], [place], [place] [address]  Comprehensive metabolic panel     Status: Abnormal   Collection Time: 06/18/22  8:56 PM  Result Value Ref Range   Sodium 135 135 - 145 mmol/L   Potassium 4.0 3.5 - 5.1 mmol/L   Chloride 102 98 - 111 mmol/L   CO2 26 22 - 32 mmol/L   Glucose, Bld 114 (H) 70 - 99 mg/dL    Comment: Glucose reference range applies only to samples taken after fasting for at least 8 hours.   BUN 8 6 - 20 mg/dL   Creatinine, Ser  1.12 0.61 - 1.24 mg/dL   Calcium 9.0 8.9 - 10.3 mg/dL   Total Protein 6.4 (L) 6.5 - 8.1 g/dL   Albumin 3.6 3.5 - 5.0 g/dL   AST 22 15 - 41 U/L   ALT 25 0 - 44 U/L   Alkaline Phosphatase 49 38 - 126 U/L   Total Bilirubin 0.7 0.3 - 1.2 mg/dL   GFR, Estimated >60 >60 mL/min    Comment: (NOTE) Calculated using the CKD-EPI Creatinine Equation (2021)    Anion gap 7 5 - 15    Comment: Performed at [facility], [address] [place], [place] [address]  CBC with Differential     Status: Abnormal   Collection Time: 06/18/22  8:56 PM  Result Value Ref Range   WBC 18.8 (H) 4.0 - 10.5 K/uL   RBC 5.48 4.22 - 5.81 MIL/uL   Hemoglobin 15.7 13.0 - 17.0 g/dL   HCT 47.4 39.0 - 52.0 %   MCV 86.5 80.0 - 100.0 fL   MCH 28.6 26.0 - 34.0 pg   MCHC 33.1 30.0 - 36.0 g/dL   RDW 14.8 11.5 - 15.5 %   Platelets 154 150 - 400 K/uL   nRBC 0.0 0.0 - 0.2 %   Neutrophils Relative % 84 %   Neutro Abs 15.8 (H) 1.7 - 7.7 K/uL   Lymphocytes Relative 7 %   Lymphs Abs 1.3 0.7 - 4.0 K/uL   Monocytes Relative 7 %   Monocytes Absolute 1.3 (H) 0.1 - 1.0 K/uL   Eosinophils Relative 1 %   Eosinophils Absolute 0.1 0.0 - 0.5 K/uL   Basophils Relative 0 %   Basophils Absolute 0.1 0.0 - 0.1 K/uL   Immature Granulocytes 1 %   Abs Immature Granulocytes 0.23 (H) 0.00 - 0.07 K/uL    Comment: Performed at [facility], [address] [place], [place] [address]  Protime-INR     Status: Abnormal  Collection Time: 06/19/22  4:05 PM  Result Value Ref Range   Prothrombin Time 15.9 (H) 11.4 - 15.2 seconds   INR 1.3 (H) 0.8 - 1.2    Comment: (NOTE) INR goal varies based on device and disease states. Performed at [facility], [place] [address] [place], [place] [address]   Glucose, capillary     Status: Abnormal   Collection Time: 06/19/22  4:25 PM  Result Value Ref Range   Glucose-Capillary 139 (H) 70 - 99 mg/dL    Comment: Glucose reference range applies only to  samples taken after fasting for at least 8 hours.   No results found for this or any previous visit (from the past 240 hour(s)). Creatinine: Recent Labs    06/18/22 2056  CREATININE 1.12   Baseline Creatinine: 1.1  Impression/Assessment:  51yo with scrotal abscess  Plan:  Scrotal abscess: Scrotal abscess I&D performed at bedside and 20cc of purulent fluid drained. Abscess cavity packed with NS gauze. Please perform daily packing change. Continue broad spectrum antibiotics pending cultures. Urology to continue to follow  Nicolette Bang 06/19/2022, 5:35 PM

## 2022-06-19 NOTE — ED Notes (Signed)
Ekg captured due to HR change, pt tachypenic, RT and MD notified.

## 2022-06-20 DIAGNOSIS — N492 Inflammatory disorders of scrotum: Secondary | ICD-10-CM | POA: Diagnosis not present

## 2022-06-20 LAB — CBC
HCT: 40.7 % (ref 39.0–52.0)
Hemoglobin: 13.3 g/dL (ref 13.0–17.0)
MCH: 28.8 pg (ref 26.0–34.0)
MCHC: 32.7 g/dL (ref 30.0–36.0)
MCV: 88.1 fL (ref 80.0–100.0)
Platelets: 126 10*3/uL — ABNORMAL LOW (ref 150–400)
RBC: 4.62 MIL/uL (ref 4.22–5.81)
RDW: 14.8 % (ref 11.5–15.5)
WBC: 10.4 10*3/uL (ref 4.0–10.5)
nRBC: 0 % (ref 0.0–0.2)

## 2022-06-20 LAB — COMPREHENSIVE METABOLIC PANEL
ALT: 17 U/L (ref 0–44)
AST: 13 U/L — ABNORMAL LOW (ref 15–41)
Albumin: 2.3 g/dL — ABNORMAL LOW (ref 3.5–5.0)
Alkaline Phosphatase: 45 U/L (ref 38–126)
Anion gap: 5 (ref 5–15)
BUN: 8 mg/dL (ref 6–20)
CO2: 25 mmol/L (ref 22–32)
Calcium: 7.6 mg/dL — ABNORMAL LOW (ref 8.9–10.3)
Chloride: 107 mmol/L (ref 98–111)
Creatinine, Ser: 0.7 mg/dL (ref 0.61–1.24)
GFR, Estimated: >60 mL/min (ref >60)
Glucose, Bld: 143 mg/dL — ABNORMAL HIGH (ref 70–99)
Potassium: 3.6 mmol/L (ref 3.5–5.1)
Sodium: 137 mmol/L (ref 135–145)
Total Bilirubin: 0.5 mg/dL (ref 0.3–1.2)
Total Protein: 5.2 g/dL — ABNORMAL LOW (ref 6.5–8.1)

## 2022-06-20 LAB — GLUCOSE, CAPILLARY
Glucose-Capillary: 121 mg/dL — ABNORMAL HIGH (ref 70–99)
Glucose-Capillary: 142 mg/dL — ABNORMAL HIGH (ref 70–99)

## 2022-06-20 LAB — URINE CULTURE: Culture: NO GROWTH

## 2022-06-20 MED ORDER — SULFAMETHOXAZOLE-TRIMETHOPRIM 800-160 MG PO TABS: 1.0000 | ORAL_TABLET | Freq: Two times a day (BID) | ORAL | 0 refills | Status: AC

## 2022-06-20 MED ORDER — CEFDINIR 300 MG PO CAPS: 300.0000 mg | ORAL_CAPSULE | Freq: Two times a day (BID) | ORAL | 0 refills | Status: AC

## 2022-06-20 MED ORDER — OXYCODONE HCL 5 MG PO TABS: 5.0000 mg | ORAL_TABLET | ORAL | 0 refills | Status: DC | PRN

## 2022-06-20 NOTE — Progress Notes (Signed)
Subjective: Patient reports improved scrotal pain. Decreased left scrotal swelling. Tmax 101.1   Objective: Vital signs in last 24 hours: Temp:  [98 F (36.7 C)-98.6 F (37 C)] 98.2 F (36.8 C) (07/02 0605) Pulse Rate:  [83-99] 83 (07/02 0605) Resp:  [16-22] 16 (07/02 0605) BP: (110-132)/(74-92) 120/79 (07/02 0605) SpO2:  [93 %-97 %] 95 % (07/02 0755) Weight:  [73.3 kg] 73.3 kg (07/01 1304)  Intake/Output from previous day: 07/01 0701 - 07/02 0700 In: 666.1 [P.O.:480; I.V.:186.1] Out: 650 [Urine:650] Intake/Output this shift: No intake/output data recorded.  Physical Exam:  General:alert, cooperative, and appears stated age GI: soft, non tender, normal bowel sounds, no palpable masses, no organomegaly, no inguinal hernia Male genitalia: decreased left scrotal edema and induration. Packing changed. No purulent drainage Extremities: extremities normal, atraumatic, no cyanosis or edema  Lab Results: Recent Labs    06/18/22 2056 06/20/22 0735  HGB 15.7 13.3  HCT 47.4 40.7   BMET Recent Labs    06/18/22 2056 06/20/22 0735  NA 135 137  K 4.0 3.6  CL 102 107  CO2 26 25  GLUCOSE 114* 143*  BUN 8 8  CREATININE 1.12 0.70  CALCIUM 9.0 7.6*   Recent Labs    06/19/22 1605  INR 1.3*   No results for input(s): "LABURIN" in the last 72 hours. Results for orders placed or performed during the hospital encounter of 06/18/22  Blood Culture (routine x 2)     Status: None (Preliminary result)   Collection Time: 06/18/22  8:50 PM   Specimen: BLOOD  Result Value Ref Range Status   Specimen Description   Final    BLOOD Blood Culture results may not be optimal due to an excessive volume of blood received in culture bottles Performed at [facility], [address], [place], [place] [address]    Special Requests   Final    LEFT ANTECUBITAL Performed at [facility], [address] [place], [place] [address]    Culture   Final     NO GROWTH < 24 HOURS Performed at [facility], [place] [address], [place], [place] [address]    Report Status PENDING  Incomplete  Blood Culture (routine x 2)     Status: None (Preliminary result)   Collection Time: 06/18/22  8:55 PM   Specimen: BLOOD  Result Value Ref Range Status   Specimen Description   Final    BLOOD Blood Culture results may not be optimal due to an inadequate volume of blood received in culture bottles Performed at [facility], [address] [place], [place] [address]    Special Requests   Final    RIGHT ANTECUBITAL Performed at [facility], [address], [place], [place] [address]    Culture   Final    NO GROWTH < 24 HOURS Performed at [facility], [place] [address], [place], [place] [address]    Report Status PENDING  Incomplete    Studies/Results: CT ABDOMEN PELVIS W CONTRAST  Result Date: 06/18/2022 CLINICAL DATA:  Lymphadenopathy, groin Abdominal pain, acute, nonlocalized scan through scrotum - has severe scrotal swelling and erythema, concern for necrotizing fascititis. History of rectal cancer. EXAM: CT ABDOMEN AND PELVIS WITH CONTRAST TECHNIQUE: Multidetector CT imaging of the abdomen and pelvis was performed using the standard protocol following bolus administration of intravenous contrast. RADIATION DOSE REDUCTION: This exam was performed according to the departmental dose-optimization program which includes automated exposure control, adjustment of the mA  and/or kV according to patient size and/or use of iterative reconstruction technique. CONTRAST:  110m OMNIPAQUE IOHEXOL 300 MG/ML  SOLN COMPARISON:  CT chest 08/05/2021, ultrasound scrotum 06/18/2022, CT abdomen pelvis 08/05/2021 FINDINGS: Lower chest: Severe emphysematous changes. Interval development of spiculated left lower lobe 1.2 x 1.1 cm pulmonary nodule. Hepatobiliary: No focal liver abnormality. Calcified gallstone noted within the gallbladder  lumen. No gallbladder wall thickening or pericholecystic fluid. No biliary dilatation. Pancreas: No focal lesion. Normal pancreatic contour. No surrounding inflammatory changes. No main pancreatic ductal dilatation. Spleen: Normal in size without focal abnormality.  Splenule noted. Adrenals/Urinary Tract: No adrenal nodule bilaterally. Bilateral kidneys enhance symmetrically. No hydronephrosis. No hydroureter. The urinary bladder is unremarkable. Stomach/Bowel: Surgical changes related to a lower anterior resection and right abdomen ileostomy formation. Stomach is within normal limits. No evidence of bowel wall thickening or dilatation. Appendix appears normal. Vascular/Lymphatic: No abdominal aorta or iliac aneurysm. Mild atherosclerotic plaque of the aorta and its branches. No abdominal, pelvic, or inguinal lymphadenopathy. Reproductive: Prostate is unremarkable. Left scrotal subcutaneus soft tissue edema. No definite organized fluid collection. No subcutaneus soft tissue emphysema. Other: Grossly similar-appearing presacral soft tissue density wavy related to surgical and radiation changes. No intraperitoneal free fluid. No intraperitoneal free gas. No organized fluid collection. Musculoskeletal: No abdominal wall hernia or abnormality. No suspicious lytic or blastic osseous lesions. No acute displaced fracture. 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 IMPRESSION: 1. Left scrotal subcutaneus soft tissue edema. No subcutaneus soft tissue emphysema or definite organized fluid collection. Please note necrotizing fasciitis cannot be excluded as this is a clinical diagnosis. Please see separately dictated ultrasound scrotum 06/18/2022. 2. Interval development of spiculated left lower lobe 1.2 x 1.1 cm pulmonary nodule. Finding could represent metastasis in the setting of prior rectal cancer versus primary lung malignancy. 3. Nonspecific enlarged 1 cm left periaortic lymph  node. Recommend attention on follow-up. 4. Cholelithiasis with no CT finding of acute cholecystitis. 5. Surgical changes related to a lower anterior resection and right abdomen ileostomy formation. 6. Emphysema (ICD10-J43.9). Electronically Signed   By: MIven FinnM.D.   On: 06/18/2022 23:03   UKoreaSCROTUM W/DOPPLER  Result Date: 06/18/2022 CLINICAL DATA:  Left groin swelling and pain for 3 days. EXAM: SCROTAL ULTRASOUND DOPPLER ULTRASOUND OF THE TESTICLES TECHNIQUE: Complete ultrasound examination of the testicles, epididymis, and other scrotal structures was performed. Color and spectral Doppler ultrasound were also utilized to evaluate blood flow to the testicles. COMPARISON:  None Available. FINDINGS: Right testicle Measurements: 3.4 x 1.7 x 2.5 cm. No mass or microlithiasis visualized. Left testicle Measurements: 4.1 x 2 x 2 cm. No mass or microlithiasis visualized. Right epididymis:  Normal in size and appearance. Left epididymis:  Normal in size and appearance. Hydrocele:  None visualized. Varicocele:  None visualized. Pulsed Doppler interrogation of both testes demonstrates normal low resistance arterial and venous waveforms bilaterally. Soft tissues: Left groin superficial soft tissues are edematous and hypervascular IMPRESSION: 1. Left groin soft tissue inflammation reflected sonographically by edema and hypervascularity. No mass or fluid collection. 2. No testicular mass or torsion. No evidence of epididymitis/orchitis. Electronically Signed   By: DLajean ManesM.D.   On: 06/18/2022 20:26    Assessment/Plan: 51yo with scrotal abscess Patient can be discharged on 7 days of Bactrim and he will followup with [facility] on 7/5 for packing change   LOS: 1 day   PNicolette Bang7/01/2022, 12:05 PM

## 2022-06-20 NOTE — Discharge Instructions (Signed)

## 2022-06-20 NOTE — Discharge Summary (Signed)
Physician Discharge Summary  [name]:[id number] DOB: 05/01/1971 DOA: 06/18/2022  PCP: Alcus Dad, MD  Admit date: 06/18/2022 Discharge date: 06/20/2022  Admitted From: Home Disposition:  Home  Recommendations for Outpatient Follow-up:  Follow up with PCP in 1 week Follow up with [facility] on 7/5 for packing change Please obtain CBC in 1 week to ensure continued resolution of leukocytosis Please follow up on the following pending results: Blood cultures, can stop the Omnicef.   Home Health: No  Equipment/Devices: None   Discharge Condition: Good CODE STATUS: Full  Diet recommendation: General  Brief/Interim Summary: From H&P: From Dr. Dannielle Huh: "[name] is a 51 y.o. male with a pertinent history of   history of COPD, Lynch syndrome and rectal adenocarcinoma s/p chemoradiation and colon resection and proctocolectomy currently in remission presenting to [facility] emergency department with complaints of worsening scrotal swelling and pain over the past 3 days of his left testicle and then subsequently spread to his penis with developing fevers and chills today and presented to ED.  He does manscape he thinks.  No other injuries or inciting events.     In the emergency department, initially 139/85, HR 122 which improved to 84 with hydration, Tmax of 101.2 satting okay on oxygen.  WBC 18.8, Hgb 15.7, PLT 154, NA 135, K4.0, CO2 26, SCR 1.12, glucose 114, lactic acid 1.8  Upon evaluation in the emergency department ultrasound of the scrotum revealed evidence of left groin inflammation without evidence of torsion epididymitis or orchitis.  CT imaging of the abdomen and pelvis was additionally performed revealing left scrotal subcutaneous soft tissue edema.  Received some nebulizers in the emergency department.  Case was discussed with Dr. Alyson Ingles, urology and said they would consult."  Interim: Scrotal I&D performed at bedside yielding around 20 cc of purulent fluid.  Abscess was packed. After that, his pain was much better.   Subjective on day of discharge: Pain continues to improve   Discharge Diagnoses:  Principal Problem:   Cellulitis of scrotum Active Problems:   Anxiety   Tobacco abuse   Neuropathic pain of foot   Centrilobular emphysema (HCC)   Hyperlipidemia associated with type 2 diabetes mellitus ([place])  Patient had a bedside I&D by the urology team. He was receiving Vancomycin and Cefepime and improving with his symptoms. The urology team OK'd him to go home on 7 more days of Bactrim. Omnicef was also sent in. He is to follow up for a packing change with [facility] on 7/5.  He will continue his other chronic medications.    Discharge Instructions  Discharge Instructions     Call MD for:  redness, tenderness, or signs of infection (pain, swelling, redness, odor or green/yellow discharge around incision site)   Complete by: As directed    Call MD for:  redness, tenderness, or signs of infection (pain, swelling, redness, odor or green/yellow discharge around incision site)   Complete by: As directed    Call MD for:  severe uncontrolled pain   Complete by: As directed    Call MD for:  severe uncontrolled pain   Complete by: As directed    Diet - low sodium heart healthy   Complete by: As directed    Diet - low sodium heart healthy   Complete by: As directed    Increase activity slowly   Complete by: As directed    Increase activity slowly   Complete by: As directed  Allergies as of 06/20/2022       Reactions   Hydroxyzine Other (See Comments)   Made the patient feel not like himself        Medication List     TAKE these medications    Accu-Chek Guide test strip Generic drug: glucose blood Use to check blood sugar up to 4 times daily   Accu-Chek Softclix Lancets lancets Use as instructed   acetaminophen 500 MG tablet Commonly known as: TYLENOL Take 1,000 mg by mouth every 6 (six) hours as needed for  fever or mild pain.   albuterol (2.5 MG/3ML) 0.083% nebulizer solution Commonly known as: PROVENTIL Take 3 mLs (2.5 mg total) by nebulization every 6 (six) hours as needed for wheezing or shortness of breath.   albuterol 108 (90 Base) MCG/ACT inhaler Commonly known as: VENTOLIN HFA Inhale 2 puffs into the lungs every 4 (four) hours as needed for wheezing or shortness of breath.   atorvastatin 20 MG tablet Commonly known as: LIPITOR TAKE 1 TABLET BY MOUTH EVERY DAY   budesonide-formoterol 160-4.5 MCG/ACT inhaler Commonly known as: Symbicort Inhale 2 puffs into the lungs 2 (two) times daily. in the morning and at bedtime.   cefdinir 300 MG capsule Commonly known as: OMNICEF Take 1 capsule (300 mg total) by mouth 2 (two) times daily for 5 days.   gabapentin 300 MG capsule Commonly known as: NEURONTIN TAKE 2 CAPSULES BY MOUTH 3 TIMES DAILY. What changed: See the new instructions.   metFORMIN 500 MG tablet Commonly known as: GLUCOPHAGE TAKE 1 TABLET BY MOUTH EVERY MORNING AND AT BEDTIME What changed:  how much to take how to take this when to take this additional instructions   oxyCODONE 5 MG immediate release tablet Commonly known as: Oxy IR/ROXICODONE Take 1 tablet (5 mg total) by mouth every 4 (four) hours as needed for moderate pain.   sertraline 100 MG tablet Commonly known as: ZOLOFT TAKE 1 TABLET BY MOUTH EVERY DAY What changed: when to take this   Spiriva Respimat 2.5 MCG/ACT Aers Generic drug: Tiotropium Bromide Monohydrate Inhale 2 puffs into the lungs daily.   sulfamethoxazole-trimethoprim 800-160 MG tablet Commonly known as: BACTRIM DS Take 1 tablet by mouth 2 (two) times daily for 5 days.        Follow-up Information     [facility]. Call on 06/23/2022.   Why: Packing change Contact information: [place] [phone]               Allergies  Allergen Reactions   Hydroxyzine  Other (See Comments)    Made the patient feel not like himself    Consultations: Urology   Procedures/Studies: CT ABDOMEN PELVIS W CONTRAST  Result Date: 06/18/2022 CLINICAL DATA:  Lymphadenopathy, groin Abdominal pain, acute, nonlocalized scan through scrotum - has severe scrotal swelling and erythema, concern for necrotizing fascititis. History of rectal cancer. EXAM: CT ABDOMEN AND PELVIS WITH CONTRAST TECHNIQUE: Multidetector CT imaging of the abdomen and pelvis was performed using the standard protocol following bolus administration of intravenous contrast. RADIATION DOSE REDUCTION: This exam was performed according to the departmental dose-optimization program which includes automated exposure control, adjustment of the mA and/or kV according to patient size and/or use of iterative reconstruction technique. CONTRAST:  173m OMNIPAQUE IOHEXOL 300 MG/ML  SOLN COMPARISON:  CT chest 08/05/2021, ultrasound scrotum 06/18/2022, CT abdomen pelvis 08/05/2021 FINDINGS: Lower chest: Severe emphysematous changes. Interval development of spiculated left lower lobe 1.2 x  1.1 cm pulmonary nodule. Hepatobiliary: No focal liver abnormality. Calcified gallstone noted within the gallbladder lumen. No gallbladder wall thickening or pericholecystic fluid. No biliary dilatation. Pancreas: No focal lesion. Normal pancreatic contour. No surrounding inflammatory changes. No main pancreatic ductal dilatation. Spleen: Normal in size without focal abnormality.  Splenule noted. Adrenals/Urinary Tract: No adrenal nodule bilaterally. Bilateral kidneys enhance symmetrically. No hydronephrosis. No hydroureter. The urinary bladder is unremarkable. Stomach/Bowel: Surgical changes related to a lower anterior resection and right abdomen ileostomy formation. Stomach is within normal limits. No evidence of bowel wall thickening or dilatation. Appendix appears normal. Vascular/Lymphatic: No abdominal aorta or iliac aneurysm. Mild  atherosclerotic plaque of the aorta and its branches. No abdominal, pelvic, or inguinal lymphadenopathy. Reproductive: Prostate is unremarkable. Left scrotal subcutaneus soft tissue edema. No definite organized fluid collection. No subcutaneus soft tissue emphysema. Other: Grossly similar-appearing presacral soft tissue density wavy related to surgical and radiation changes. No intraperitoneal free fluid. No intraperitoneal free gas. No organized fluid collection. Musculoskeletal: No abdominal wall hernia or abnormality. No suspicious lytic or blastic osseous lesions. No acute displaced fracture. 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 IMPRESSION: 1. Left scrotal subcutaneus soft tissue edema. No subcutaneus soft tissue emphysema or definite organized fluid collection. Please note necrotizing fasciitis cannot be excluded as this is a clinical diagnosis. Please see separately dictated ultrasound scrotum 06/18/2022. 2. Interval development of spiculated left lower lobe 1.2 x 1.1 cm pulmonary nodule. Finding could represent metastasis in the setting of prior rectal cancer versus primary lung malignancy. 3. Nonspecific enlarged 1 cm left periaortic lymph node. Recommend attention on follow-up. 4. Cholelithiasis with no CT finding of acute cholecystitis. 5. Surgical changes related to a lower anterior resection and right abdomen ileostomy formation. 6. Emphysema (ICD10-J43.9). Electronically Signed   By: Iven Finn M.D.   On: 06/18/2022 23:03   US SCROTUM W/DOPPLER  Result Date: 06/18/2022 CLINICAL DATA:  Left groin swelling and pain for 3 days. EXAM: SCROTAL ULTRASOUND DOPPLER ULTRASOUND OF THE TESTICLES TECHNIQUE: Complete ultrasound examination of the testicles, epididymis, and other scrotal structures was performed. Color and spectral Doppler ultrasound were also utilized to evaluate blood flow to the testicles. COMPARISON:  None Available. FINDINGS: Right  testicle Measurements: 3.4 x 1.7 x 2.5 cm. No mass or microlithiasis visualized. Left testicle Measurements: 4.1 x 2 x 2 cm. No mass or microlithiasis visualized. Right epididymis:  Normal in size and appearance. Left epididymis:  Normal in size and appearance. Hydrocele:  None visualized. Varicocele:  None visualized. Pulsed Doppler interrogation of both testes demonstrates normal low resistance arterial and venous waveforms bilaterally. Soft tissues: Left groin superficial soft tissues are edematous and hypervascular IMPRESSION: 1. Left groin soft tissue inflammation reflected sonographically by edema and hypervascularity. No mass or fluid collection. 2. No testicular mass or torsion. No evidence of epididymitis/orchitis. Electronically Signed   By: Lajean Manes M.D.   On: 06/18/2022 20:26    Discharge Exam: Vitals:   06/20/22 0605 06/20/22 0755  BP: 120/79   Pulse: 83   Resp: 16   Temp: 98.2 F (36.8 C)   SpO2: 94% 95%    General: Pt is alert, awake, not in acute distress Cardiovascular: RRR, S1/S2 +, no edema Respiratory: CTA bilaterally, no wheezing, no rhonchi, no respiratory distress, no conversational dyspnea  Abdominal: Soft, NT, ND, bowel sounds + Extremities: no edema, no cyanosis Psych: Normal mood and affect, stable judgement and insight     The results of significant  diagnostics from this hospitalization (including imaging, microbiology, ancillary and laboratory) are listed below for reference.     Microbiology: Recent Results (from the past 240 hour(s))  Urine Culture     Status: None   Collection Time: 06/18/22 12:06 AM   Specimen: In/Out Cath Urine  Result Value Ref Range Status   Specimen Description   Final    IN/OUT CATH URINE Performed at [facility], [address] [place], [place] [address]    Special Requests   Final    NONE Performed at [facility], [address] [place], [place] [address]    Culture   Final     NO GROWTH Performed at [facility], [place] [address] [place], [place] [address]    Report Status 06/20/2022 FINAL  Final  Blood Culture (routine x 2)     Status: None (Preliminary result)   Collection Time: 06/18/22  8:50 PM   Specimen: BLOOD  Result Value Ref Range Status   Specimen Description   Final    BLOOD Blood Culture results may not be optimal due to an excessive volume of blood received in culture bottles Performed at [facility], [address], [place], [place] [address]    Special Requests   Final    LEFT ANTECUBITAL Performed at [facility], [address], [place], [place] [address]    Culture   Final    NO GROWTH < 24 HOURS Performed at [facility], [place] [address], [place], [place] [address]    Report Status PENDING  Incomplete  Blood Culture (routine x 2)     Status: None (Preliminary result)   Collection Time: 06/18/22  8:55 PM   Specimen: BLOOD  Result Value Ref Range Status   Specimen Description   Final    BLOOD Blood Culture results may not be optimal due to an inadequate volume of blood received in culture bottles Performed at [facility], [address], [place], [place] [address]    Special Requests   Final    RIGHT ANTECUBITAL Performed at [facility], [address], [place], [place] [address]    Culture   Final    NO GROWTH < 24 HOURS Performed at [facility], [place] [address] [place], [place] [address]    Report Status PENDING  Incomplete     Labs: Basic Metabolic Panel: Recent Labs  Lab 06/18/22 2056 06/20/22 0735  NA 135 137  K 4.0 3.6  CL 102 107  CO2 26 25  GLUCOSE 114* 143*  BUN 8 8  CREATININE 1.12 0.70  CALCIUM 9.0 7.6*   Liver Function Tests: Recent Labs  Lab 06/18/22 2056 06/20/22 0735  AST 22 13*  ALT 25 17  ALKPHOS 49 45  BILITOT 0.7 0.5  PROT 6.4* 5.2*  ALBUMIN 3.6 2.3*   CBC: Recent Labs  Lab  06/18/22 2056 06/20/22 0735  WBC 18.8* 10.4  NEUTROABS 15.8*  --   HGB 15.7 13.3  HCT 47.4 40.7  MCV 86.5 88.1  PLT 154 126*   CBG: Recent Labs  Lab 06/19/22 1625 06/19/22 2108 06/20/22 0731 06/20/22 1206  GLUCAP 139* 131* 121* 142*   Hgb A1c Recent Labs    06/19/22 1605  HGBA1C 7.2*   Urinalysis    Component Value Date/Time   COLORURINE YELLOW 06/18/2022 1815   APPEARANCEUR CLEAR 06/18/2022 1815   LABSPEC 1.011 06/18/2022 1815   PHURINE 6.0 06/18/2022 1815  GLUCOSEU NEGATIVE 06/18/2022 1815   HGBUR NEGATIVE 06/18/2022 1815   BILIRUBINUR NEGATIVE 06/18/2022 1815   KETONESUR NEGATIVE 06/18/2022 1815   PROTEINUR NEGATIVE 06/18/2022 1815   NITRITE NEGATIVE 06/18/2022 1815   LEUKOCYTESUR NEGATIVE 06/18/2022 1815   Sepsis Labs Recent Labs  Lab 06/18/22 2056 06/20/22 0735  WBC 18.8* 10.4   Microbiology Recent Results (from the past 240 hour(s))  Urine Culture     Status: None   Collection Time: 06/18/22 12:06 AM   Specimen: In/Out Cath Urine  Result Value Ref Range Status   Specimen Description   Final    IN/OUT CATH URINE Performed at [facility], [address], [place], [place] [address]    Special Requests   Final    NONE Performed at [facility], [address] [place], [place] [address]    Culture   Final    NO GROWTH Performed at [facility], [place] [address] [place], [place] [address]    Report Status 06/20/2022 FINAL  Final  Blood Culture (routine x 2)     Status: None (Preliminary result)   Collection Time: 06/18/22  8:50 PM   Specimen: BLOOD  Result Value Ref Range Status   Specimen Description   Final    BLOOD Blood Culture results may not be optimal due to an excessive volume of blood received in culture bottles Performed at [facility], [address], [place], [place] [address]    Special Requests   Final    LEFT ANTECUBITAL Performed at [facility], [address] [place], [place] [address]    Culture   Final    NO GROWTH < 24 HOURS Performed at [facility], [place] [address], [place], [place] [address]    Report Status PENDING  Incomplete  Blood Culture (routine x 2)     Status: None (Preliminary result)   Collection Time: 06/18/22  8:55 PM   Specimen: BLOOD  Result Value Ref Range Status   Specimen Description   Final    BLOOD Blood Culture results may not be optimal due to an inadequate volume of blood received in culture bottles Performed at [facility], [address] [place], [place] [address]    Special Requests   Final    RIGHT ANTECUBITAL Performed at [facility], [address], [place], [place] [address]    Culture   Final    NO GROWTH < 24 HOURS Performed at [facility], [place] [address], [place], [place] [address]    Report Status PENDING  Incomplete     Patient was seen and examined on the day of discharge and was found to be in stable condition. Time coordinating discharge: 30 minutes including assessment and coordination of care, as well as examination of the patient.   SIGNED:  Shelda Pal, DO [facility] 06/20/2022, 3:17 PM

## 2022-06-20 NOTE — Progress Notes (Signed)
Pt discharged to home. DC instructions given with wife at bedside. No concerns voiced. Left unit ambulatory and stable accompanied by wife. No concerns voiced. Refused to be wheeled down.

## 2022-06-23 ENCOUNTER — Encounter: Payer: Self-pay | Admitting: Family Medicine

## 2022-06-23 ENCOUNTER — Ambulatory Visit (INDEPENDENT_AMBULATORY_CARE_PROVIDER_SITE_OTHER): Payer: Medicaid Other | Admitting: Family Medicine

## 2022-06-23 VITALS — BP 110/70 | HR 73 | Ht 71.0 in | Wt 164.0 lb

## 2022-06-23 DIAGNOSIS — N492 Inflammatory disorders of scrotum: Secondary | ICD-10-CM | POA: Diagnosis not present

## 2022-06-23 DIAGNOSIS — E1165 Type 2 diabetes mellitus with hyperglycemia: Secondary | ICD-10-CM | POA: Diagnosis not present

## 2022-06-23 MED ORDER — KETOROLAC TROMETHAMINE 30 MG/ML IJ SOLN
30.0000 mg | Freq: Once | INTRAMUSCULAR | Status: AC
  Administered 2022-06-23: 30 mg via INTRAMUSCULAR

## 2022-06-23 MED ORDER — OXYCODONE HCL 5 MG PO TABS: 5.0000 mg | ORAL_TABLET | Freq: Four times a day (QID) | ORAL | 0 refills | Status: DC | PRN

## 2022-06-23 NOTE — Progress Notes (Signed)
    SUBJECTIVE:   CHIEF COMPLAINT / HPI:   Hospital Follow-up Admitted 06/18/22-06/20/22 for scrotal abscess. Had I&D and was seen by urology. Discharged on Cefdinir and Bactrim which he is still taking. Packing fell out yesterday. Reports ongoing severe pain. Taking Tylenol '1000mg'$  and Oxycodone '5mg'$  four times daily with only slight relief. No fever/chills, no increased swelling, no drainage from the area.  PERTINENT  PMH / PSH:  T2DM, Lynch syndrome, rectal adenocarcinoma  OBJECTIVE:   BP 110/70   Pulse 73   Ht '5\' 11"'$  (1.803 m)   Wt 164 lb (74.4 kg)   SpO2 94%   BMI 22.87 kg/m   General: NAD, pleasant, able to participate in exam Respiratory: No respiratory distress Skin: warm and dry, no rashes noted Psych: Normal affect and mood Neuro: grossly intact GU: exam performed in the presence of a chaperone. Induration of left side of scrotum and left inguinal region. No discrete abscess, no increased warmth, no erythema.   ASSESSMENT/PLAN:   Scrotal abscess s/p I&D for scrotal abscess on 6/30. Blood cultures negative. Still with significant induration on exam today but no signs of active infection and overall seems to be improving. -Has appt with Urology on 7/10 -Continue Cefdinir and Bactrim  -No indication for repeat labs today (leukocytosis has resolved at time of discharge, has remained afebrile) -Toradol '30mg'$  x1 for pain -Continue scheduled Tylenol '1000mg'$  q6h alternating with oxycodone '5mg'$  q6h. PDMP reviewed and refill sent for few days of oxycodone.  Type 2 diabetes mellitus with hyperglycemia, without long-term current use of insulin (HCC) A1c 7.2% in the hospital. Has not been taking his Metformin. To minimize side effects, encouraged to resume Metformin '500mg'$  once daily for 1-2 weeks, then increase to BID which he was previously on. Has appt for annual physical on 07/07/22 to discuss further.   Alcus Dad, MD [place]

## 2022-06-23 NOTE — Patient Instructions (Addendum)
It was great to see you!  -Continue taking the antibiotics for your scrotal infection.  -You have an appointment with [facility]  Monday July 10th at 9:30am [place] [phone]  -Please resume your metformin '500mg'$  once daily. You can increase back to twice daily after 2 weeks.  -Try taking over-the-counter Melatonin '3mg'$ , 1 hour prior to bedtime. Avoid screen time 1 hour prior to sleep. Avoid caffeine intake after noon.   -Come back to see me for a physical/diabetes visit.  Take care and seek immediate care sooner if you develop any concerns.  Dr. [facility]

## 2022-06-23 NOTE — Assessment & Plan Note (Signed)
s/p I&D for scrotal abscess on 6/30. Blood cultures negative. Still with significant induration on exam today but no signs of active infection and overall seems to be improving. -Has appt with Urology on 7/10 -Continue Cefdinir and Bactrim  -No indication for repeat labs today (leukocytosis has resolved at time of discharge, has remained afebrile) -Toradol '30mg'$  x1 for pain -Continue scheduled Tylenol '1000mg'$  q6h alternating with oxycodone '5mg'$  q6h. PDMP reviewed and refill sent for few days of oxycodone.

## 2022-06-23 NOTE — Assessment & Plan Note (Signed)
A1c 7.2% in the hospital. Has not been taking his Metformin. To minimize side effects, encouraged to resume Metformin '500mg'$  once daily for 1-2 weeks, then increase to BID which he was previously on. Has appt for annual physical on 07/07/22 to discuss further.

## 2022-06-24 LAB — CULTURE, BLOOD (ROUTINE X 2)
Culture: NO GROWTH
Culture: NO GROWTH

## 2022-06-28 ENCOUNTER — Ambulatory Visit: Payer: Medicaid Other | Admitting: Urology

## 2022-06-28 DIAGNOSIS — N492 Inflammatory disorders of scrotum: Secondary | ICD-10-CM

## 2022-06-30 ENCOUNTER — Telehealth: Payer: Self-pay | Admitting: Hematology

## 2022-06-30 ENCOUNTER — Telehealth: Payer: Self-pay | Admitting: Family Medicine

## 2022-06-30 ENCOUNTER — Other Ambulatory Visit: Payer: Self-pay | Admitting: Family Medicine

## 2022-06-30 NOTE — Telephone Encounter (Signed)
Erroneous, please disregard

## 2022-06-30 NOTE — Telephone Encounter (Signed)
Rescheduled upcoming appointment due to provider on call. Patient's significant other is aware of changes.

## 2022-07-05 NOTE — Progress Notes (Deleted)
    SUBJECTIVE:   Chief complaint/HPI: annual examination  [name] is a 51 y.o. who presents today for an annual exam.   History tabs reviewed and updated ***.   Review of systems form reviewed and notable for ***.   Type 2 Diabetes Home medications include: Metformin '500mg'$  BID Patient reports *** medication compliance. Patient checks sugar *** at home. Typically range *** *** hypoglycemic episodes/symptoms  Most recent A1Cs:  Lab Results  Component Value Date   HGBA1C 7.2 (H) 06/19/2022   HGBA1C 7.2 (H) 06/19/2022   HGBA1C 8.7 (A) 07/01/2021   Last Microalbumin, LDL, Creatinine: Lab Results  Component Value Date   LDLCALC 79 09/25/2020   CREATININE 0.70 06/20/2022    Patient is not up to date on diabetic eye. Patient is not up to date on diabetic foot exam.   OBJECTIVE:   There were no vitals taken for this visit.  ***  ASSESSMENT/PLAN:   No problem-specific Assessment & Plan notes found for this encounter.    Annual Examination  See AVS for age appropriate recommendations.  PHQ score ***, reviewed and discussed.  Blood pressure value is *** goal, discussed.   Considered the following screening exams based upon USPSTF recommendations: Diabetes screening: {discussed/ordered:14545} Screening for elevated cholesterol: {discussed/ordered:14545} HIV testing: {discussed/ordered:14545} Hepatitis C: {discussed/ordered:14545} Hepatitis B: {discussed/ordered:14545} Syphilis if at high risk: {discussed/ordered:14545} Reviewed risk factors for latent tuberculosis and {not indicated/requested/declined:14582} Colorectal cancer screening: {crcscreen:23821::"discussed, colonoscopy ordered"} Lung cancer screening: {discussed/declined/written OZDG:64403} See documentation below regarding discussion and indication.  PSA discussed and after engaging in discussion of possible risks, benefits and complications of screening patient elected to ***.   Follow up in 1 year or  sooner if indicated.    Alcus Dad, MD [place]

## 2022-07-07 ENCOUNTER — Ambulatory Visit: Payer: Medicaid Other | Admitting: Family Medicine

## 2022-07-08 ENCOUNTER — Other Ambulatory Visit: Payer: Self-pay | Admitting: Family Medicine

## 2022-07-09 DIAGNOSIS — Z932 Ileostomy status: Secondary | ICD-10-CM | POA: Diagnosis not present

## 2022-07-09 NOTE — Telephone Encounter (Signed)
Declined Rx request for hydroxyzine. Not on current medication list and is listed as an allergy. Will need appointment to discuss.

## 2022-07-27 ENCOUNTER — Other Ambulatory Visit: Payer: Self-pay

## 2022-07-27 DIAGNOSIS — C20 Malignant neoplasm of rectum: Secondary | ICD-10-CM

## 2022-07-28 ENCOUNTER — Other Ambulatory Visit: Payer: Medicaid Other

## 2022-07-28 ENCOUNTER — Ambulatory Visit: Payer: Medicaid Other | Admitting: Hematology

## 2022-07-28 ENCOUNTER — Inpatient Hospital Stay: Payer: Medicaid Other | Attending: Family Medicine

## 2022-07-28 ENCOUNTER — Inpatient Hospital Stay: Payer: Medicaid Other | Admitting: Hematology

## 2022-07-28 DIAGNOSIS — C20 Malignant neoplasm of rectum: Secondary | ICD-10-CM

## 2022-07-31 ENCOUNTER — Other Ambulatory Visit: Payer: Self-pay | Admitting: Family Medicine

## 2022-07-31 DIAGNOSIS — J441 Chronic obstructive pulmonary disease with (acute) exacerbation: Secondary | ICD-10-CM

## 2022-08-13 ENCOUNTER — Other Ambulatory Visit: Payer: Self-pay | Admitting: Family Medicine

## 2022-08-13 DIAGNOSIS — E1165 Type 2 diabetes mellitus with hyperglycemia: Secondary | ICD-10-CM

## 2022-08-16 ENCOUNTER — Other Ambulatory Visit: Payer: Self-pay | Admitting: *Deleted

## 2022-08-16 MED ORDER — SERTRALINE HCL 100 MG PO TABS: 100.0000 mg | ORAL_TABLET | Freq: Every day | ORAL | 0 refills | Status: DC

## 2022-08-18 ENCOUNTER — Ambulatory Visit (INDEPENDENT_AMBULATORY_CARE_PROVIDER_SITE_OTHER): Payer: Medicaid Other | Admitting: Urology

## 2022-08-18 ENCOUNTER — Encounter: Payer: Self-pay | Admitting: Urology

## 2022-08-18 VITALS — BP 109/77 | HR 121

## 2022-08-18 DIAGNOSIS — N492 Inflammatory disorders of scrotum: Secondary | ICD-10-CM

## 2022-08-18 LAB — URINALYSIS, ROUTINE W REFLEX MICROSCOPIC
Bilirubin, UA: NEGATIVE
Glucose, UA: NEGATIVE
Ketones, UA: NEGATIVE
Leukocytes,UA: NEGATIVE
Nitrite, UA: NEGATIVE
RBC, UA: NEGATIVE
Specific Gravity, UA: 1.025 (ref 1.005–1.030)
Urobilinogen, Ur: 0.2 mg/dL (ref 0.2–1.0)
pH, UA: 5.5 (ref 5.0–7.5)

## 2022-08-18 MED ORDER — DOXYCYCLINE HYCLATE 100 MG PO CAPS: 100.0000 mg | ORAL_CAPSULE | Freq: Two times a day (BID) | ORAL | 0 refills | Status: DC

## 2022-08-18 NOTE — Patient Instructions (Signed)
Epidermoid Cyst  An epidermoid cyst, also called an epidermal cyst, is a small lump under your skin. The cyst contains a substance called keratin. Do not try to pop or open the cyst yourself. What are the causes? A blocked hair follicle. A hair that curls and re-enters the skin instead of growing straight out of the skin. A blocked pore. Irritated skin. An injury to the skin. Certain conditions that are passed along from parent to child. Human papillomavirus (HPV). This happens rarely when cysts occur on the bottom of the feet. Long-term sun damage to the skin. What increases the risk? Having acne. Being male. Having an injury to the skin. Being past puberty. Having certain conditions caused by genes (genetic disorder) What are the signs or symptoms? These cysts are usually harmless, but they can get infected. Symptoms of infection may include: Redness. Inflammation. Tenderness. Warmth. Fever. A bad-smelling substance that drains from the cyst. Pus that drains from the cyst. How is this treated? In many cases, epidermoid cysts go away on their own without treatment. If a cyst becomes infected, treatment may include: Opening and draining the cyst, done by a doctor. After draining, you may need minor surgery to remove the rest of the cyst. Antibiotic medicine. Shots of medicines (steroids) that help to reduce inflammation. Surgery to remove the cyst. Surgery may be done if the cyst: Becomes large. Bothers you. Has a chance of turning into cancer. Do not try to open a cyst yourself. Follow these instructions at home: Medicines Take over-the-counter and prescription medicines as told by your doctor. If you were prescribed an antibiotic medicine, take it as told by your doctor. Do not stop taking it even if you start to feel better. General instructions Keep the area around your cyst clean and dry. Wear loose, dry clothing. Avoid touching your cyst. Check your cyst every day  for signs of infection. Check for: Redness, swelling, or pain. Fluid or blood. Warmth. Pus or a bad smell. Keep all follow-up visits. How is this prevented? Wear clean, dry, clothing. Avoid wearing tight clothing. Keep your skin clean and dry. Take showers or baths every day. Contact a doctor if: Your cyst has symptoms of infection. Your condition does not improve or gets worse. You have a cyst that looks different from other cysts you have had. You have a fever. Get help right away if: Redness spreads from the cyst into the area close by. Summary An epidermoid cyst is a small lump under your skin. If a cyst becomes infected, treatment may include surgery to open and drain the cyst, or to remove it. Take over-the-counter and prescription medicines only as told by your doctor. Contact a doctor if your condition is not improving or is getting worse. Keep all follow-up visits. This information is not intended to replace advice given to you by your health care provider. Make sure you discuss any questions you have with your health care provider. Document Revised: 03/12/2020 Document Reviewed: 03/12/2020 Elsevier Patient Education  2023 Elsevier Inc.  

## 2022-08-18 NOTE — H&P (View-Only) (Signed)
08/18/2022 12:00 PM   [name] 1971-01-19 [id number]  Referring provider: Alcus Dad, MD [place],  [place] [address]  Scrotal swelling   HPI: [name] is a 51yo here for evaluation  of new scrotal swelling. He underwent I&D of a scrotal abscess in [place]. He was doing well until 1 week ago when he developed left scrotal swelling. He developed mild sharp intermittent scrotal pain. No drainage from scrotum. No issues urinating.    PMH: Past Medical History:  Diagnosis Date   Anxiety    C. 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 In families with MSH6 mutations, screening can start at age 32 years since the onset of colon cancer is later in these families.  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    Surgical History: Past Surgical History:  Procedure Laterality Date   COLON SURGERY     EUS N/A 07/29/2016   Procedure: LOWER ENDOSCOPIC ULTRASOUND (EUS);  Surgeon: Milus Banister, MD;  Location: [facility];  Service: Endoscopy;  Laterality: N/A;   ILEOSTOMY CLOSURE N/A 09/22/2017   Procedure: TAKEDOWN OF LOOP ILEOSTOMY;  Surgeon: Michael Boston, MD;  Location: [facility];  Service: General;  Laterality: N/A;   IR REMOVAL TUN ACCESS W/ PORT W/O FL MOD SED  08/27/2021   LYSIS OF ADHESION N/A 05/10/2018   Procedure: LYSIS OF  ADHESION;  Surgeon: Michael Boston, MD;  Location: [facility];  Service: General;  Laterality: N/A;   PORTACATH PLACEMENT N/A 01/26/2017   Procedure: PLACEMENT OF PORT-A-CATH CENTRAL LINE WITH FLUOROSCOPY AND ULTRASOUND;  Surgeon: Michael Boston, MD;  Location: [facility];  Service: General;  Laterality: N/A;   POUCHOSCOPY N/A 09/21/2017   Procedure: POUCHOSCOPY;  Surgeon: Leighton Ruff, MD;  [facility];  Service: Endoscopy;  Laterality: N/A;   POUCHOSCOPY N/A 01/12/2018   Procedure: POUCHOSCOPY WITH BIOPSIES;  Surgeon: Leighton Ruff, MD;  [facility];  Service: Endoscopy;  Laterality: N/A;  Local   PROCTOSCOPY N/A 12/01/2016   Procedure: RIGID PROCTOSCOPY;  Surgeon: Michael Boston, MD;  [facility]: [facility];  Service: General;  Laterality: N/A;   RECTAL EXAM UNDER ANESTHESIA N/A 05/10/2018   Procedure: RECTAL EXAM UNDER ANESTHESIA;  Surgeon: Michael Boston, MD;  [facility];  Service: General;  Laterality: N/A;   TOE AMPUTATION Left    XI ROBOTIC ASSISTED LOWER ANTERIOR RESECTION N/A 12/01/2016   Procedure: XI ROBOTIC ASSISTED PROCTOCOLECTOMY WITH ILLEOPOUCH ANASTAMOSIS WITH DIVERTING ILLEOSTOMY;  Surgeon: Michael Boston, MD;  [facility];  Service: General;  Laterality: N/A;   XI ROBOTIC ASSISTED LOWER ANTERIOR RESECTION N/A 05/10/2018   Procedure: XI ROBOTIC RESECTION OF ILEAL J POUCH, LYSIS OF ADHESIONS, WITH CREATION OF PERMANENT END ILEOSTOMY.;  Surgeon: Michael Boston, MD;  Location: [facility];  Service: General;  Laterality: N/A;    Home Medications:  Allergies as  of 08/18/2022       Reactions   Hydroxyzine Other (See Comments)   Made the patient feel not like himself        Medication List        Accurate as of August 18, 2022 12:00 PM. If you have any questions, ask your nurse or doctor.          Accu-Chek Guide test strip Generic drug: glucose blood Use to check blood sugar up to 4 times daily   Accu-Chek Softclix Lancets lancets Use as  instructed   acetaminophen 500 MG tablet Commonly known as: TYLENOL Take 1,000 mg by mouth every 6 (six) hours as needed for fever or mild pain.   albuterol (2.5 MG/3ML) 0.083% nebulizer solution Commonly known as: PROVENTIL Take 3 mLs (2.5 mg total) by nebulization every 6 (six) hours as needed for wheezing or shortness of breath.   Ventolin HFA 108 (90 Base) MCG/ACT inhaler Generic drug: albuterol INHALE 2 PUFFS BY MOUTH EVERY 4 HOURS AS NEEDED   atorvastatin 20 MG tablet Commonly known as: LIPITOR TAKE 1 TABLET BY MOUTH EVERY DAY   budesonide-formoterol 160-4.5 MCG/ACT inhaler Commonly known as: Symbicort Inhale 2 puffs into the lungs 2 (two) times daily. in the morning and at bedtime.   gabapentin 300 MG capsule Commonly known as: NEURONTIN TAKE 2 CAPSULES BY MOUTH 3 TIMES DAILY. What changed: See the new instructions.   metFORMIN 500 MG tablet Commonly known as: GLUCOPHAGE TAKE 1 TABLET BY MOUTH EVERY MORNING AND AT BEDTIME. Please schedule office visit to discuss your diabetes before additional refills.   oxyCODONE 5 MG immediate release tablet Commonly known as: Oxy IR/ROXICODONE Take 1 tablet (5 mg total) by mouth every 6 (six) hours as needed for moderate pain.   sertraline 100 MG tablet Commonly known as: ZOLOFT Take 1 tablet (100 mg total) by mouth daily.   Spiriva Respimat 2.5 MCG/ACT Aers Generic drug: Tiotropium Bromide Monohydrate Inhale 2 puffs into the lungs daily.        Allergies:  Allergies  Allergen Reactions   Hydroxyzine Other (See Comments)    Made the patient feel not like himself    Family History: Family History  Problem Relation Age of Onset   Diabetes Mother    COPD Father    Colon cancer Maternal Aunt        dx in her 5s   Diabetes Maternal Grandmother    Brain cancer Maternal Grandfather    Lung cancer Paternal Grandfather    Colon cancer Cousin        dx in her 85s   Bone cancer Sister 8   Pancreatic cancer Neg Hx     Esophageal cancer Neg Hx    Stomach cancer Neg Hx    Liver disease Neg Hx     Social History:  reports that he has been smoking cigarettes. He started smoking about 36 years ago. He has a 33.00 pack-year smoking history. He has never used smokeless tobacco. He reports that he does not drink alcohol and does not use drugs.  ROS: All other review of systems were reviewed and are negative except what is noted above in HPI  Physical Exam: BP 109/77   Pulse (!) 121   Constitutional:  Alert and oriented, No acute distress. HEENT: [place] AT, moist mucus membranes.  Trachea midline, no masses. Cardiovascular: No clubbing, cyanosis, or edema. Respiratory: Normal respiratory effort, no increased work of breathing. GI: Abdomen is soft, nontender, nondistended, no abdominal  masses GU: No CVA tenderness. Circumcised phallus. No masses/lesions on penis, testis, scrotum. 2cm left posterior scrotal cyst with mild induration and erythema Lymph: No cervical or inguinal lymphadenopathy. Skin: No rashes, bruises or suspicious lesions. Neurologic: Grossly intact, no focal deficits, moving all 4 extremities. Psychiatric: Normal mood and affect.  Laboratory Data: Lab Results  Component Value Date   WBC 10.4 06/20/2022   HGB 13.3 06/20/2022   HCT 40.7 06/20/2022   MCV 88.1 06/20/2022   PLT 126 (L) 06/20/2022    Lab Results  Component Value Date   CREATININE 0.70 06/20/2022    No results found for: "PSA"  No results found for: "TESTOSTERONE"  Lab Results  Component Value Date   HGBA1C 7.2 (H) 06/19/2022    Urinalysis    Component Value Date/Time   COLORURINE YELLOW 06/18/2022 1815   APPEARANCEUR CLEAR 06/18/2022 1815   LABSPEC 1.011 06/18/2022 1815   PHURINE 6.0 06/18/2022 1815   GLUCOSEU NEGATIVE 06/18/2022 1815   HGBUR NEGATIVE 06/18/2022 1815   BILIRUBINUR NEGATIVE 06/18/2022 1815   BILIRUBINUR large (A) 07/25/2018 1441   KETONESUR NEGATIVE 06/18/2022 1815   PROTEINUR NEGATIVE  06/18/2022 1815   UROBILINOGEN 0.2 07/25/2018 1441   NITRITE NEGATIVE 06/18/2022 1815   LEUKOCYTESUR NEGATIVE 06/18/2022 1815    Lab Results  Component Value Date   BACTERIA NONE SEEN 03/29/2018    Pertinent Imaging:  Results for orders placed during the hospital encounter of 01/12/18  DG Abd 1 View - KUB  Narrative CLINICAL DATA:  Postop.  EXAM: ABDOMEN - 1 VIEW  COMPARISON:  CT 12/06/2017  FINDINGS: Upright portable view of the chest and upper abdomen demonstrates no visible free air. Mildly prominent left abdominal small bowel loops could reflect ileus or early small bowel obstruction. Lungs are clear. Heart is normal size. No effusions.  IMPRESSION: No visible free air. Mildly prominent left abdominal small bowel loops, nonspecific. This could reflect ileus or early small bowel obstruction.   Electronically Signed By: Rolm Baptise M.D. On: 01/12/2018 11:38  No results found for this or any previous visit.  No results found for this or any previous visit.  No results found for this or any previous visit.  No results found for this or any previous visit.  No results found for this or any previous visit.  No results found for this or any previous visit.  No results found for this or any previous visit.   Assessment & Plan:    1. Scrotal abscess/scrotal sebaceous cyst We discussed observation versus surgical excision and the patient elected to excision. Risks/benefits alternatives discussed. - Urinalysis, Routine w reflex microscopic   No follow-ups on file.  Nicolette Bang, MD  [facility] [place]

## 2022-08-18 NOTE — Progress Notes (Signed)
 08/18/2022 12:00 PM   [name] 03/12/1971 [id number]  Referring provider: Wells, Ashleigh, MD [address] [place],  [place] [address]  Scrotal swelling   HPI: [name] is a 51yo here for evaluation  of new scrotal swelling. He underwent I&D of a scrotal abscess in [place]. He was doing well until 1 week ago when he developed left scrotal swelling. He developed mild sharp intermittent scrotal pain. No drainage from scrotum. No issues urinating.    PMH: Past Medical History:  Diagnosis Date   Anxiety    C. 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 In families with MSH6 mutations, screening can start at age 30 years since the onset of colon cancer is later in these families.  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    Surgical History: Past Surgical History:  Procedure Laterality Date   COLON SURGERY     EUS N/A 07/29/2016   Procedure: LOWER ENDOSCOPIC ULTRASOUND (EUS);  Surgeon: Daniel P Jacobs, MD;  Location: [facility];  Service: Endoscopy;  Laterality: N/A;   ILEOSTOMY CLOSURE N/A 09/22/2017   Procedure: TAKEDOWN OF LOOP ILEOSTOMY;  Surgeon: Gross, [name], MD;  [facility];  Service: General;  Laterality: N/A;   IR REMOVAL TUN ACCESS W/ PORT W/O FL MOD SED  08/27/2021   LYSIS OF ADHESION N/A 05/10/2018   Procedure: LYSIS OF  ADHESION;  Surgeon: Gross, [name], MD;  [facility];  Service: General;  Laterality: N/A;   PORTACATH PLACEMENT N/A 01/26/2017   Procedure: PLACEMENT OF PORT-A-CATH CENTRAL LINE WITH FLUOROSCOPY AND ULTRASOUND;  Surgeon: [name] Gross, MD;  Location: [facility];  Service: General;  Laterality: N/A;   POUCHOSCOPY N/A 09/21/2017   Procedure: POUCHOSCOPY;  Surgeon: Thomas, Alicia, MD;  [facility];  Service: Endoscopy;  Laterality: N/A;   POUCHOSCOPY N/A 01/12/2018   Procedure: POUCHOSCOPY WITH BIOPSIES;  Surgeon: Thomas, Alicia, MD;  [facility];  Service: Endoscopy;  Laterality: N/A;  Local   PROCTOSCOPY N/A 12/01/2016   Procedure: RIGID PROCTOSCOPY;  Surgeon: [name] Gross, MD;  Location: [facility];  Service: General;  Laterality: N/A;   RECTAL EXAM UNDER ANESTHESIA N/A 05/10/2018   Procedure: RECTAL EXAM UNDER ANESTHESIA;  Surgeon: Gross, [name], MD;  [facility];  Service: General;  Laterality: N/A;   TOE AMPUTATION Left    XI ROBOTIC ASSISTED LOWER ANTERIOR RESECTION N/A 12/01/2016   Procedure: XI ROBOTIC ASSISTED PROCTOCOLECTOMY WITH ILLEOPOUCH ANASTAMOSIS WITH DIVERTING ILLEOSTOMY;  Surgeon: [name] Gross, MD;  [facility];  Service: General;  Laterality: N/A;   XI ROBOTIC ASSISTED LOWER ANTERIOR RESECTION N/A 05/10/2018   Procedure: XI ROBOTIC RESECTION OF ILEAL J POUCH, LYSIS OF ADHESIONS, WITH CREATION OF PERMANENT END ILEOSTOMY.;  Surgeon: Gross, [name], MD;  [facility];  Service: General;  Laterality: N/A;    Home Medications:  Allergies as   of 08/18/2022       Reactions   Hydroxyzine Other (See Comments)   Made the patient feel not like himself        Medication List        Accurate as of August 18, 2022 12:00 PM. If you have any questions, ask your nurse or doctor.          Accu-Chek Guide test strip Generic drug: glucose blood Use to check blood sugar up to 4 times daily   Accu-Chek Softclix Lancets lancets Use as  instructed   acetaminophen 500 MG tablet Commonly known as: TYLENOL Take 1,000 mg by mouth every 6 (six) hours as needed for fever or mild pain.   albuterol (2.5 MG/3ML) 0.083% nebulizer solution Commonly known as: PROVENTIL Take 3 mLs (2.5 mg total) by nebulization every 6 (six) hours as needed for wheezing or shortness of breath.   Ventolin HFA 108 (90 Base) MCG/ACT inhaler Generic drug: albuterol INHALE 2 PUFFS BY MOUTH EVERY 4 HOURS AS NEEDED   atorvastatin 20 MG tablet Commonly known as: LIPITOR TAKE 1 TABLET BY MOUTH EVERY DAY   budesonide-formoterol 160-4.5 MCG/ACT inhaler Commonly known as: Symbicort Inhale 2 puffs into the lungs 2 (two) times daily. in the morning and at bedtime.   gabapentin 300 MG capsule Commonly known as: NEURONTIN TAKE 2 CAPSULES BY MOUTH 3 TIMES DAILY. What changed: See the new instructions.   metFORMIN 500 MG tablet Commonly known as: GLUCOPHAGE TAKE 1 TABLET BY MOUTH EVERY MORNING AND AT BEDTIME. Please schedule office visit to discuss your diabetes before additional refills.   oxyCODONE 5 MG immediate release tablet Commonly known as: Oxy IR/ROXICODONE Take 1 tablet (5 mg total) by mouth every 6 (six) hours as needed for moderate pain.   sertraline 100 MG tablet Commonly known as: ZOLOFT Take 1 tablet (100 mg total) by mouth daily.   Spiriva Respimat 2.5 MCG/ACT Aers Generic drug: Tiotropium Bromide Monohydrate Inhale 2 puffs into the lungs daily.        Allergies:  Allergies  Allergen Reactions   Hydroxyzine Other (See Comments)    Made the patient feel not like himself    Family History: Family History  Problem Relation Age of Onset   Diabetes Mother    COPD Father    Colon cancer Maternal Aunt        dx in her 60s   Diabetes Maternal Grandmother    Brain cancer Maternal Grandfather    Lung cancer Paternal Grandfather    Colon cancer Cousin        dx in her 40s   Bone cancer Sister 8   Pancreatic cancer Neg Hx     Esophageal cancer Neg Hx    Stomach cancer Neg Hx    Liver disease Neg Hx     Social History:  reports that he has been smoking cigarettes. He started smoking about 36 years ago. He has a 33.00 pack-year smoking history. He has never used smokeless tobacco. He reports that he does not drink alcohol and does not use drugs.  ROS: All other review of systems were reviewed and are negative except what is noted above in HPI  Physical Exam: BP 109/77   Pulse (!) 121   Constitutional:  Alert and oriented, No acute distress. HEENT: [place] AT, moist mucus membranes.  Trachea midline, no masses. Cardiovascular: No clubbing, cyanosis, or edema. Respiratory: Normal respiratory effort, no increased work of breathing. GI: Abdomen is soft, nontender, nondistended, no abdominal   masses GU: No CVA tenderness. Circumcised phallus. No masses/lesions on penis, testis, scrotum. 2cm left posterior scrotal cyst with mild induration and erythema Lymph: No cervical or inguinal lymphadenopathy. Skin: No rashes, bruises or suspicious lesions. Neurologic: Grossly intact, no focal deficits, moving all 4 extremities. Psychiatric: Normal mood and affect.  Laboratory Data: Lab Results  Component Value Date   WBC 10.4 06/20/2022   HGB 13.3 06/20/2022   HCT 40.7 06/20/2022   MCV 88.1 06/20/2022   PLT 126 (L) 06/20/2022    Lab Results  Component Value Date   CREATININE 0.70 06/20/2022    No results found for: "PSA"  No results found for: "TESTOSTERONE"  Lab Results  Component Value Date   HGBA1C 7.2 (H) 06/19/2022    Urinalysis    Component Value Date/Time   COLORURINE YELLOW 06/18/2022 1815   APPEARANCEUR CLEAR 06/18/2022 1815   LABSPEC 1.011 06/18/2022 1815   PHURINE 6.0 06/18/2022 1815   GLUCOSEU NEGATIVE 06/18/2022 1815   HGBUR NEGATIVE 06/18/2022 1815   BILIRUBINUR NEGATIVE 06/18/2022 1815   BILIRUBINUR large (A) 07/25/2018 1441   KETONESUR NEGATIVE 06/18/2022 1815   PROTEINUR NEGATIVE  06/18/2022 1815   UROBILINOGEN 0.2 07/25/2018 1441   NITRITE NEGATIVE 06/18/2022 1815   LEUKOCYTESUR NEGATIVE 06/18/2022 1815    Lab Results  Component Value Date   BACTERIA NONE SEEN 03/29/2018    Pertinent Imaging:  Results for orders placed during the hospital encounter of 01/12/18  DG Abd 1 View - KUB  Narrative CLINICAL DATA:  Postop.  EXAM: ABDOMEN - 1 VIEW  COMPARISON:  CT 12/06/2017  FINDINGS: Upright portable view of the chest and upper abdomen demonstrates no visible free air. Mildly prominent left abdominal small bowel loops could reflect ileus or early small bowel obstruction. Lungs are clear. Heart is normal size. No effusions.  IMPRESSION: No visible free air. Mildly prominent left abdominal small bowel loops, nonspecific. This could reflect ileus or early small bowel obstruction.   Electronically Signed By: Kevin  Dover M.D. On: 01/12/2018 11:38  No results found for this or any previous visit.  No results found for this or any previous visit.  No results found for this or any previous visit.  No results found for this or any previous visit.  No results found for this or any previous visit.  No results found for this or any previous visit.  No results found for this or any previous visit.   Assessment & Plan:    1. Scrotal abscess/scrotal sebaceous cyst We discussed observation versus surgical excision and the patient elected to excision. Risks/benefits alternatives discussed. - Urinalysis, Routine w reflex microscopic   No follow-ups on file.  Anneta Rounds, MD   [facility] [place]   

## 2022-09-08 ENCOUNTER — Ambulatory Visit: Payer: Medicaid Other | Admitting: Physician Assistant

## 2022-09-09 ENCOUNTER — Other Ambulatory Visit: Payer: Self-pay | Admitting: Family Medicine

## 2022-09-09 DIAGNOSIS — E1165 Type 2 diabetes mellitus with hyperglycemia: Secondary | ICD-10-CM

## 2022-09-10 NOTE — Patient Instructions (Signed)
[name]  09/10/2022     '@[email or web address]'$ @   Your procedure is scheduled on  09/16/2022.   Report to [facility] at  [email or web address].   Call this number if you have problems the morning of surgery:  [phone]   Remember:  Do not eat or drink after midnight.      Take these medicines the morning of surgery with A SIP OF WATER                                    Neurontin, zoloft.    Do not wear jewelry, make-up or nail polish.  Do not wear lotions, powders, or perfumes, or deodorant.  Do not shave 48 hours prior to surgery.  Men may shave face and neck.  Do not bring valuables to the hospital.  [facility] is not responsible for any belongings or valuables.  Contacts, dentures or bridgework may not be worn into surgery.  Leave your suitcase in the car.  After surgery it may be brought to your room.  For patients admitted to the hospital, discharge time will be determined by your treatment team.  Patients discharged the day of surgery will not be allowed to drive home and must have someone with them for 24 hours.     Special instructions:   DO NOT smoke tobacco or vape for 24 hours before your procedure.  Please read over the following fact sheets that you were given. Coughing and Deep Breathing, Surgical Site Infection Prevention, Anesthesia Post-op Instructions, and Care and Recovery After Surgery      Epidermoid Cyst Removal, Care After This sheet gives you information about how to care for yourself after your procedure. Your health care provider may also give you more specific instructions. If you have problems or questions, contact your health care provider. What can I expect after the procedure? After the procedure, it is common to have: Soreness in the area where your cyst was removed. Tightness or itchiness from the stitches (sutures) in your skin. Follow these instructions at home: Medicines Take over-the-counter and prescription medicines only as  told by your health care provider. If you were prescribed an antibiotic medicine or ointment, take or apply it as told by your health care provider. Do not stop using the antibiotic even if you start to feel better. Incision care  Follow instructions from your health care provider about how to take care of your incision. Make sure you: Wash your hands with soap and water for at least 20 seconds before you change your bandage (dressing). If soap and water are not available, use hand sanitizer. Change your dressing as told by your health care provider. Leave sutures, skin glue, or adhesive strips in place. These skin closures may need to stay in place for 1-2 weeks or longer. If adhesive strip edges start to loosen and curl up, you may trim the loose edges. Do not remove adhesive strips completely unless your health care provider tells you to do that. Keep the dressing dry until your health care provider says that it can be removed. After your dressing is off, check your incision area every day for signs of infection. Check for: Redness, swelling, or pain. Fluid or blood. Warmth. Pus or a bad smell. General instructions Do not take baths, swim, or use a hot tub until your health care provider approves. Ask your  health care provider if you may take showers. You may only be allowed to take sponge baths. Your health care provider may ask you to avoid contact sports or activities that take a lot of effort. Do not do anything that stretches or puts pressure on your incision. You can return to your normal diet. Keep all follow-up visits. This is important. Contact a health care provider if: You have a fever. You have redness, swelling, or pain in the incision area. You have fluid or blood coming from your incision. You have pus or a bad smell coming from your incision. Your incision feels warm to the touch. Your cyst grows back. Get help right away if: If the incision site suddenly increases in  size and you have pain at the incision site. You may be checked for a collection of blood under the skin from the procedure (hematoma). Summary After the procedure, it is common to have soreness in the area where your cyst was removed. Take or apply over-the-counter and prescription medicines only as told by your health care provider. Follow instructions from your health care provider about how to take care of your incision. This information is not intended to replace advice given to you by your health care provider. Make sure you discuss any questions you have with your health care provider. Document Revised: 03/12/2020 Document Reviewed: 03/12/2020 Elsevier Patient Education  [place] Anesthesia, Adult, Care After The following information offers guidance on how to care for yourself after your procedure. Your health care provider may also give you more specific instructions. If you have problems or questions, contact your health care provider. What can I expect after the procedure? After the procedure, it is common for people to: Have pain or discomfort at the IV site. Have nausea or vomiting. Have a sore throat or hoarseness. Have trouble concentrating. Feel cold or chills. Feel weak, sleepy, or tired (fatigue). Have soreness and body aches. These can affect parts of the body that were not involved in surgery. Follow these instructions at home: For the time period you were told by your health care provider:  Rest. Do not participate in activities where you could fall or become injured. Do not drive or use machinery. Do not drink alcohol. Do not take sleeping pills or medicines that cause drowsiness. Do not make important decisions or sign legal documents. Do not take care of children on your own. General instructions Drink enough fluid to keep your urine pale yellow. If you have sleep apnea, surgery and certain medicines can increase your risk for breathing  problems. Follow instructions from your health care provider about wearing your sleep device: Anytime you are sleeping, including during daytime naps. While taking prescription pain medicines, sleeping medicines, or medicines that make you drowsy. Return to your normal activities as told by your health care provider. Ask your health care provider what activities are safe for you. Take over-the-counter and prescription medicines only as told by your health care provider. Do not use any products that contain nicotine or tobacco. These products include cigarettes, chewing tobacco, and vaping devices, such as e-cigarettes. These can delay incision healing after surgery. If you need help quitting, ask your health care provider. Contact a health care provider if: You have nausea or vomiting that does not get better with medicine. You vomit every time you eat or drink. You have pain that does not get better with medicine. You cannot urinate or have bloody urine. You develop a skin rash.  You have a fever. Get help right away if: You have trouble breathing. You have chest pain. You vomit blood. These symptoms may be an emergency. Get help right away. Call 911. Do not wait to see if the symptoms will go away. Do not drive yourself to the hospital. Summary After the procedure, it is common to have a sore throat, hoarseness, nausea, vomiting, or to feel weak, sleepy, or fatigue. For the time period you were told by your health care provider, do not drive or use machinery. Get help right away if you have difficulty breathing, have chest pain, or vomit blood. These symptoms may be an emergency. This information is not intended to replace advice given to you by your health care provider. Make sure you discuss any questions you have with your health care provider. Document Revised: 03/05/2022 Document Reviewed: 03/05/2022 Elsevier Patient Education  [place].

## 2022-09-14 ENCOUNTER — Encounter (HOSPITAL_COMMUNITY)
Admission: RE | Admit: 2022-09-14 | Discharge: 2022-09-14 | Disposition: A | Discharge status: Home / Self Care | Payer: Medicaid Other | Source: Ambulatory Visit | Attending: Urology | Admitting: Urology

## 2022-09-16 ENCOUNTER — Ambulatory Visit (HOSPITAL_COMMUNITY)
Admission: RE | Admit: 2022-09-16 | Discharge: 2022-09-16 | Disposition: A | Discharge status: Home / Self Care | Payer: Medicaid Other | Attending: Urology | Admitting: Urology

## 2022-09-16 ENCOUNTER — Ambulatory Visit (HOSPITAL_BASED_OUTPATIENT_CLINIC_OR_DEPARTMENT_OTHER): Payer: Medicaid Other | Admitting: Certified Registered"

## 2022-09-16 ENCOUNTER — Ambulatory Visit (HOSPITAL_COMMUNITY): Payer: Medicaid Other | Admitting: Certified Registered"

## 2022-09-16 ENCOUNTER — Encounter (HOSPITAL_COMMUNITY): Payer: Self-pay | Admitting: Urology

## 2022-09-16 ENCOUNTER — Encounter (HOSPITAL_COMMUNITY)
Admission: RE | Disposition: A | Discharge status: Home / Self Care | Payer: Self-pay | Source: Home / Self Care | Attending: Urology

## 2022-09-16 DIAGNOSIS — E119 Type 2 diabetes mellitus without complications: Secondary | ICD-10-CM | POA: Insufficient documentation

## 2022-09-16 DIAGNOSIS — G709 Myoneural disorder, unspecified: Secondary | ICD-10-CM | POA: Insufficient documentation

## 2022-09-16 DIAGNOSIS — F17200 Nicotine dependence, unspecified, uncomplicated: Secondary | ICD-10-CM | POA: Diagnosis not present

## 2022-09-16 DIAGNOSIS — N492 Inflammatory disorders of scrotum: Secondary | ICD-10-CM

## 2022-09-16 DIAGNOSIS — A630 Anogenital (venereal) warts: Secondary | ICD-10-CM | POA: Insufficient documentation

## 2022-09-16 DIAGNOSIS — L723 Sebaceous cyst: Secondary | ICD-10-CM

## 2022-09-16 DIAGNOSIS — F418 Other specified anxiety disorders: Secondary | ICD-10-CM | POA: Diagnosis not present

## 2022-09-16 DIAGNOSIS — L720 Epidermal cyst: Secondary | ICD-10-CM | POA: Insufficient documentation

## 2022-09-16 DIAGNOSIS — L729 Follicular cyst of the skin and subcutaneous tissue, unspecified: Secondary | ICD-10-CM | POA: Diagnosis not present

## 2022-09-16 HISTORY — PX: SCROTAL EXPLORATION: SHX2386

## 2022-09-16 LAB — GLUCOSE, CAPILLARY
Glucose-Capillary: 114 mg/dL — ABNORMAL HIGH (ref 70–99)
Glucose-Capillary: 151 mg/dL — ABNORMAL HIGH (ref 70–99)

## 2022-09-16 SURGERY — EXPLORATION, SCROTUM
Anesthesia: General | Site: Scrotum

## 2022-09-16 MED ORDER — OXYCODONE HCL 5 MG/5ML PO SOLN: 5.0000 mg | Freq: Once | ORAL | Status: DC | PRN

## 2022-09-16 MED ORDER — DEXAMETHASONE SODIUM PHOSPHATE 10 MG/ML IJ SOLN
INTRAMUSCULAR | Status: DC | PRN
  Administered 2022-09-16: 10 mg via INTRAVENOUS

## 2022-09-16 MED ORDER — CHLORHEXIDINE GLUCONATE 0.12 % MT SOLN: 15.0000 mL | Freq: Once | OROMUCOSAL | Status: DC

## 2022-09-16 MED ORDER — FENTANYL CITRATE (PF) 100 MCG/2ML IJ SOLN
INTRAMUSCULAR | Status: AC
  Filled 2022-09-16: qty 2

## 2022-09-16 MED ORDER — OXYCODONE HCL 5 MG PO TABS
5.0000 mg | ORAL_TABLET | Freq: Once | ORAL | Status: AC
  Administered 2022-09-16: 5 mg via ORAL

## 2022-09-16 MED ORDER — CONDYLOX 0.5 % EX GEL: Freq: Two times a day (BID) | CUTANEOUS | 3 refills | Status: DC

## 2022-09-16 MED ORDER — CEFAZOLIN SODIUM-DEXTROSE 2-4 GM/100ML-% IV SOLN
INTRAVENOUS | Status: AC
  Filled 2022-09-16: qty 100

## 2022-09-16 MED ORDER — LIDOCAINE HCL (PF) 2 % IJ SOLN
INTRAMUSCULAR | Status: AC
  Filled 2022-09-16: qty 10

## 2022-09-16 MED ORDER — MIDAZOLAM HCL 2 MG/2ML IJ SOLN
INTRAMUSCULAR | Status: AC
  Filled 2022-09-16: qty 2

## 2022-09-16 MED ORDER — DEXAMETHASONE SODIUM PHOSPHATE 10 MG/ML IJ SOLN
INTRAMUSCULAR | Status: AC
  Filled 2022-09-16: qty 1

## 2022-09-16 MED ORDER — PROPOFOL 10 MG/ML IV BOLUS
INTRAVENOUS | Status: AC
  Filled 2022-09-16: qty 20

## 2022-09-16 MED ORDER — 0.9 % SODIUM CHLORIDE (POUR BTL) OPTIME
TOPICAL | Status: DC | PRN
  Administered 2022-09-16: 1000 mL

## 2022-09-16 MED ORDER — ONDANSETRON HCL 4 MG/2ML IJ SOLN: 4.0000 mg | Freq: Once | INTRAMUSCULAR | Status: DC | PRN

## 2022-09-16 MED ORDER — LACTATED RINGERS IV SOLN: INTRAVENOUS | Status: DC

## 2022-09-16 MED ORDER — FENTANYL CITRATE PF 50 MCG/ML IJ SOSY: 25.0000 ug | PREFILLED_SYRINGE | INTRAMUSCULAR | Status: DC | PRN

## 2022-09-16 MED ORDER — CEFAZOLIN SODIUM-DEXTROSE 2-4 GM/100ML-% IV SOLN
2.0000 g | INTRAVENOUS | Status: AC
  Administered 2022-09-16: 2 g via INTRAVENOUS

## 2022-09-16 MED ORDER — LACTATED RINGERS IV SOLN: INTRAVENOUS | Status: DC | PRN

## 2022-09-16 MED ORDER — ONDANSETRON HCL 4 MG/2ML IJ SOLN
INTRAMUSCULAR | Status: AC
  Filled 2022-09-16: qty 2

## 2022-09-16 MED ORDER — ORAL CARE MOUTH RINSE: 15.0000 mL | Freq: Once | OROMUCOSAL | Status: DC

## 2022-09-16 MED ORDER — FENTANYL CITRATE (PF) 100 MCG/2ML IJ SOLN
INTRAMUSCULAR | Status: DC | PRN
  Administered 2022-09-16 (×3): 25 ug via INTRAVENOUS

## 2022-09-16 MED ORDER — LIDOCAINE 2% (20 MG/ML) 5 ML SYRINGE
INTRAMUSCULAR | Status: DC | PRN
  Administered 2022-09-16: 80 mg via INTRAVENOUS

## 2022-09-16 MED ORDER — PROPOFOL 10 MG/ML IV BOLUS
INTRAVENOUS | Status: DC | PRN
  Administered 2022-09-16: 150 mg via INTRAVENOUS

## 2022-09-16 MED ORDER — OXYCODONE HCL 5 MG PO TABS: 5.0000 mg | ORAL_TABLET | Freq: Four times a day (QID) | ORAL | 0 refills | Status: DC | PRN

## 2022-09-16 MED ORDER — OXYCODONE HCL 5 MG PO TABS
5.0000 mg | ORAL_TABLET | Freq: Once | ORAL | Status: DC | PRN
  Filled 2022-09-16: qty 1

## 2022-09-16 MED ORDER — BUPIVACAINE HCL (PF) 0.25 % IJ SOLN
INTRAMUSCULAR | Status: AC
  Filled 2022-09-16: qty 30

## 2022-09-16 MED ORDER — ONDANSETRON HCL 4 MG/2ML IJ SOLN
INTRAMUSCULAR | Status: DC | PRN
  Administered 2022-09-16: 4 mg via INTRAVENOUS

## 2022-09-16 MED ORDER — MIDAZOLAM HCL 5 MG/5ML IJ SOLN
INTRAMUSCULAR | Status: DC | PRN
  Administered 2022-09-16: 2 mg via INTRAVENOUS

## 2022-09-16 MED ORDER — BACITRACIN 500 UNIT/GM EX OINT
TOPICAL_OINTMENT | CUTANEOUS | Status: DC | PRN
  Administered 2022-09-16: 1 via TOPICAL

## 2022-09-16 MED ORDER — BACITRACIN ZINC 500 UNIT/GM EX OINT
TOPICAL_OINTMENT | CUTANEOUS | Status: AC
  Filled 2022-09-16: qty 0.9

## 2022-09-16 SURGICAL SUPPLY — 35 items
ADH SKN CLS APL DERMABOND .7 (GAUZE/BANDAGES/DRESSINGS)
BNDG GAUZE ELAST 4 BULKY (GAUZE/BANDAGES/DRESSINGS) IMPLANT
COVER SURGICAL LIGHT HANDLE (MISCELLANEOUS) ×2 IMPLANT
DERMABOND ADVANCED .7 DNX12 (GAUZE/BANDAGES/DRESSINGS) IMPLANT
DRAIN PENROSE 0.5X18 (DRAIN) IMPLANT
ELECT NDL TIP 2.8 STRL (NEEDLE) IMPLANT
ELECT NEEDLE TIP 2.8 STRL (NEEDLE) IMPLANT
ELECT REM PT RETURN 9FT ADLT (ELECTROSURGICAL) ×1
ELECTRODE REM PT RTRN 9FT ADLT (ELECTROSURGICAL) ×1 IMPLANT
GAUZE SPONGE 4X4 12PLY STRL (GAUZE/BANDAGES/DRESSINGS) ×1 IMPLANT
GAUZE SPONGE 4X4 12PLY STRL LF (GAUZE/BANDAGES/DRESSINGS) IMPLANT
GLOVE BIO SURGEON STRL SZ8 (GLOVE) ×1 IMPLANT
GLOVE BIOGEL PI IND STRL 7.0 (GLOVE) ×2 IMPLANT
GLOVE BIOGEL PI IND STRL 8 (GLOVE) ×1 IMPLANT
GOWN STRL REUS W/TWL LRG LVL3 (GOWN DISPOSABLE) ×1 IMPLANT
GOWN STRL REUS W/TWL XL LVL3 (GOWN DISPOSABLE) ×1 IMPLANT
KIT TURNOVER KIT A (KITS) ×1 IMPLANT
MANIFOLD NEPTUNE II (INSTRUMENTS) ×1 IMPLANT
NDL HYPO 25X1 1.5 SAFETY (NEEDLE) ×1 IMPLANT
NEEDLE HYPO 25X1 1.5 SAFETY (NEEDLE) ×1 IMPLANT
PACK MINOR (CUSTOM PROCEDURE TRAY) ×1 IMPLANT
PAD ARMBOARD 7.5X6 YLW CONV (MISCELLANEOUS) ×1 IMPLANT
SET BASIN LINEN APH (SET/KITS/TRAYS/PACK) ×1 IMPLANT
SOL PREP POV-IOD 4OZ 10% (MISCELLANEOUS) ×1 IMPLANT
SOL PREP PROV IODINE SCRUB 4OZ (MISCELLANEOUS) ×1 IMPLANT
SUPPORT SCROTAL LG STRP (MISCELLANEOUS) ×1 IMPLANT
SUT CHROMIC 3 0 SH 27 (SUTURE) IMPLANT
SUT CHROMIC 4 0 PS 2 18 (SUTURE) IMPLANT
SUT MNCRL AB 4-0 PS2 18 (SUTURE) IMPLANT
SUT PROLENE 4 0 RB 1 (SUTURE)
SUT PROLENE 4-0 RB1 .5 CRCL 36 (SUTURE) IMPLANT
SUT SILK 0 FSL (SUTURE) IMPLANT
SUT VIC AB 3-0 SH 27 (SUTURE) ×1
SUT VIC AB 3-0 SH 27X BRD (SUTURE) IMPLANT
SYR CONTROL 10ML LL (SYRINGE) ×1 IMPLANT

## 2022-09-16 NOTE — Op Note (Signed)
Preoperative diagnosis: scrotal sebaceous cyst  Postoperative diagnosis: scrotal sebaceous cyst, condyloma  Procedure: excision of scrotal sebaceous cyst and condyloma  Attending: Nicolette Bang, MD  Anesthesia: General  History of blood loss: 5cc  Antibiotics: ancef  Drains: none  Specimens: scrotal sebaceous cyst and condyloma  Findings: 1.5cm scrotal sebaceous cyst, left hemiscrotum. Left hemiscrotum condyloma excised 1cm. Extensive condyloma involving posterior scrotum, perineum and right medial thigh  Indications: Patient is a 51 year old male with a history of scrotal sebaceous cyst that was intermittently infected and drained intermittently.  After discussing treatment options he decided to proceed with excision.   Procedure in detail: Prior to procedure consent was obtained.  Patient was brought to the operating room and a brief timeout was done to ensure correct patient, correct procedure, correct site.  General anesthesia was administered and patient was placed in supine position.  His genitalia and abdomen was then prepped and draped in usual sterile fashion.  A 2 cm elliptical incision was made around the left scrotal cyst and a separate 1.5cm elliptical incision was made around the condyloma. Using sharp dissection the cyst and the condyloma were removed. Hemostasis was achieved with electrocautery.  We then closed the incisions with 3-0 Vicryl in a running fashion. We then placed a scrotal fluff and this then concluded the procedure which was well tolerated by the patient.  Complications: None  Condition: Stable, extubated, transferred to PACU.  Plan: Patient is to be discharged home.  He is to follow up in 2 weeks for wound check.

## 2022-09-16 NOTE — Anesthesia Procedure Notes (Signed)
Procedure Name: LMA Insertion Date/Time: 09/16/2022 7:52 AM  Performed by: Myna Bright, CRNAPre-anesthesia Checklist: Patient identified, Emergency Drugs available, Patient being monitored and Suction available Patient Re-evaluated:Patient Re-evaluated prior to induction Oxygen Delivery Method: Circle system utilized Preoxygenation: Pre-oxygenation with 100% oxygen Induction Type: IV induction Ventilation: Mask ventilation without difficulty LMA: LMA inserted LMA Size: 4.0 Tube type: Oral Number of attempts: 1 Placement Confirmation: positive ETCO2 and breath sounds checked- equal and bilateral Tube secured with: Tape Dental Injury: Teeth and Oropharynx as per pre-operative assessment

## 2022-09-16 NOTE — Anesthesia Preprocedure Evaluation (Addendum)
Anesthesia Evaluation  Patient identified by MRN, date of birth, ID band Patient awake    Reviewed: Allergy & Precautions, H&P , NPO status , Patient's Chart, lab work & pertinent test results, reviewed documented beta blocker date and time   Airway Mallampati: II  TM Distance: >3 FB Neck ROM: full    Dental no notable dental hx. (+) Edentulous Upper, Missing   Pulmonary neg pulmonary ROS, Current Smoker,    Pulmonary exam normal breath sounds clear to auscultation       Cardiovascular Exercise Tolerance: Good negative cardio ROS   Rhythm:regular Rate:Normal     Neuro/Psych PSYCHIATRIC DISORDERS Anxiety Depression  Neuromuscular disease    GI/Hepatic negative GI ROS, Neg liver ROS,   Endo/Other  diabetes, Type 2  Renal/GU negative Renal ROS  negative genitourinary   Musculoskeletal   Abdominal   Peds  Hematology negative hematology ROS (+)   Anesthesia Other Findings   Reproductive/Obstetrics negative OB ROS                            Anesthesia Physical Anesthesia Plan  ASA: 2 and emergent  Anesthesia Plan: General and General LMA   Post-op Pain Management:    Induction:   PONV Risk Score and Plan: Ondansetron  Airway Management Planned:   Additional Equipment:   Intra-op Plan:   Post-operative Plan:   Informed Consent: I have reviewed the patients History and Physical, chart, labs and discussed the procedure including the risks, benefits and alternatives for the proposed anesthesia with the patient or authorized representative who has indicated his/her understanding and acceptance.     Dental Advisory Given  Plan Discussed with: CRNA  Anesthesia Plan Comments:         Anesthesia Quick Evaluation

## 2022-09-16 NOTE — Interval H&P Note (Signed)
History and Physical Interval Note:  09/16/2022 7:33 AM  [name]  has presented today for surgery, with the diagnosis of scrotal sebaceous cyst.  The various methods of treatment have been discussed with the patient and family. After consideration of risks, benefits and other options for treatment, the patient has consented to  Procedure(s): SCROTUM EXPLORATION-excision of scrotal sebaceous cyst (N/A) as a surgical intervention.  The patient's history has been reviewed, patient examined, no change in status, stable for surgery.  I have reviewed the patient's chart and labs.  Questions were answered to the patient's satisfaction.     Nicolette Bang

## 2022-09-17 LAB — SURGICAL PATHOLOGY

## 2022-09-18 NOTE — Transfer of Care (Signed)
Immediate Anesthesia Transfer of Care Note  Patient: [name]  Procedure(s) Performed: SCROTUM EXPLORATION-excision of scrotal sebaceous cyst (Scrotum)  Patient Location: PACU  Anesthesia Type:General  Level of Consciousness: awake and alert   Airway & Oxygen Therapy: Patient Spontanous Breathing  Post-op Assessment: Report given to RN and Post -op Vital signs reviewed and stable  Post vital signs: Reviewed and stable  Last Vitals:  Vitals Value Taken Time  BP 116/97 09/16/22 0921  Temp 36.6 C 09/16/22 0921  Pulse 83 09/16/22 0921  Resp 18 09/16/22 0921  SpO2 96 % 09/16/22 0921    Last Pain:  Vitals:   09/16/22 0921  TempSrc: Axillary  PainSc: 7       Patients Stated Pain Goal: 5 (92/49/32 4199)  Complications: No notable events documented.

## 2022-09-18 NOTE — Anesthesia Postprocedure Evaluation (Signed)
Anesthesia Post Note  Patient: [name]  Procedure(s) Performed: SCROTUM EXPLORATION-excision of scrotal sebaceous cyst (Scrotum)  Patient location during evaluation: Phase II Anesthesia Type: General Level of consciousness: awake Pain management: pain level controlled Vital Signs Assessment: post-procedure vital signs reviewed and stable Respiratory status: spontaneous breathing and respiratory function stable Cardiovascular status: blood pressure returned to baseline and stable Postop Assessment: no headache and no apparent nausea or vomiting Anesthetic complications: no Comments: Late entry   No notable events documented.   Last Vitals:  Vitals:   09/16/22 0907 09/16/22 0921  BP:  (!) 116/97  Pulse: 86 83  Resp: 14 18  Temp:  36.6 C  SpO2: 95% 96%    Last Pain:  Vitals:   09/16/22 0921  TempSrc: Axillary  PainSc: [place]

## 2022-09-22 NOTE — Addendum Note (Signed)
Addendum  created 09/22/22 1127 by Myna Bright, CRNA   Intraprocedure Staff edited

## 2022-09-23 ENCOUNTER — Encounter (HOSPITAL_COMMUNITY): Payer: Self-pay | Admitting: Urology

## 2022-09-27 ENCOUNTER — Telehealth: Payer: Self-pay

## 2022-09-27 NOTE — Telephone Encounter (Signed)
Patient's significant other called and states she thinks his incision sites are infected.  Patient denies fever, he states his pain level is low.  Patient did not start his antibiotic after surgery as rx'd so his significant other had him start the prescription today.  I offered patient an afternoon apt today to be evaluated by the PA and he refused, he wanted to continue antibiotic and keep his scheduled apt on 10/12.

## 2022-09-28 DIAGNOSIS — Z932 Ileostomy status: Secondary | ICD-10-CM | POA: Diagnosis not present

## 2022-09-30 ENCOUNTER — Ambulatory Visit: Payer: Medicaid Other | Admitting: Physician Assistant

## 2022-10-08 ENCOUNTER — Other Ambulatory Visit: Payer: Self-pay | Admitting: Family Medicine

## 2022-10-08 DIAGNOSIS — E1165 Type 2 diabetes mellitus with hyperglycemia: Secondary | ICD-10-CM

## 2022-10-28 DIAGNOSIS — Z932 Ileostomy status: Secondary | ICD-10-CM | POA: Diagnosis not present

## 2022-11-13 ENCOUNTER — Other Ambulatory Visit: Payer: Self-pay | Admitting: Family Medicine

## 2022-11-16 ENCOUNTER — Ambulatory Visit: Payer: Medicaid Other

## 2022-11-16 NOTE — Progress Notes (Deleted)
  SUBJECTIVE:   CHIEF COMPLAINT / HPI:   Cyst:  Present for Painful  Fevers or chills: ***   PERTINENT  PMH / PSH:  anxiety, history of C. difficile, colon cancer, Lynch syndrome status post total proctocolectomy, status post loop ileostomy takedown, depression  OBJECTIVE:  There were no vitals taken for this visit. Physical Exam   ASSESSMENT/PLAN:  There are no diagnoses linked to this encounter.  Doxy or bactrim   No follow-ups on file. Erskine Emery, MD 11/16/2022, 7:17 AM    PGY-2, [place] {    This will disappear when note is signed, click to select method of visit    :1}

## 2022-11-16 NOTE — Patient Instructions (Incomplete)
It was great to see you today! Thank you for choosing [facility] for your primary care. [name] was seen for follow up.  Today we addressed: Continuing with antibiotics   If you haven't already, sign up for My Chart to have easy access to your labs results, and communication with your primary care physician.  {AVS options:[id number]}  You should return to our clinic No follow-ups on file. Please arrive 15 minutes before your appointment to ensure smooth check in process.  We appreciate your efforts in making this happen.  Thank you for allowing me to participate in your care, Erskine Emery, MD 11/16/2022, 7:18 AM PGY-***, [place]

## 2022-11-29 ENCOUNTER — Other Ambulatory Visit: Payer: Self-pay | Admitting: Family Medicine

## 2022-11-29 DIAGNOSIS — E1165 Type 2 diabetes mellitus with hyperglycemia: Secondary | ICD-10-CM

## 2022-11-29 DIAGNOSIS — E1169 Type 2 diabetes mellitus with other specified complication: Secondary | ICD-10-CM

## 2022-12-29 DIAGNOSIS — Z932 Ileostomy status: Secondary | ICD-10-CM | POA: Diagnosis not present

## 2023-01-27 ENCOUNTER — Other Ambulatory Visit: Payer: Self-pay | Admitting: Family Medicine

## 2023-01-27 DIAGNOSIS — J441 Chronic obstructive pulmonary disease with (acute) exacerbation: Secondary | ICD-10-CM

## 2023-01-28 DIAGNOSIS — Z932 Ileostomy status: Secondary | ICD-10-CM | POA: Diagnosis not present

## 2023-02-01 ENCOUNTER — Other Ambulatory Visit: Payer: Self-pay | Admitting: Family Medicine

## 2023-02-11 ENCOUNTER — Ambulatory Visit (INDEPENDENT_AMBULATORY_CARE_PROVIDER_SITE_OTHER): Payer: Medicaid Other | Admitting: Pulmonary Disease

## 2023-02-11 ENCOUNTER — Encounter (HOSPITAL_BASED_OUTPATIENT_CLINIC_OR_DEPARTMENT_OTHER): Payer: Self-pay | Admitting: Pulmonary Disease

## 2023-02-11 VITALS — BP 110/68 | HR 73 | Ht 71.0 in | Wt 167.6 lb

## 2023-02-11 DIAGNOSIS — J441 Chronic obstructive pulmonary disease with (acute) exacerbation: Secondary | ICD-10-CM

## 2023-02-11 DIAGNOSIS — J432 Centrilobular emphysema: Secondary | ICD-10-CM | POA: Diagnosis not present

## 2023-02-11 DIAGNOSIS — R911 Solitary pulmonary nodule: Secondary | ICD-10-CM

## 2023-02-11 MED ORDER — PREDNISONE 10 MG PO TABS: ORAL_TABLET | ORAL | 0 refills | Status: AC

## 2023-02-11 MED ORDER — ALBUTEROL SULFATE HFA 108 (90 BASE) MCG/ACT IN AERS: 2.0000 | INHALATION_SPRAY | Freq: Four times a day (QID) | RESPIRATORY_TRACT | 2 refills | Status: DC | PRN

## 2023-02-11 MED ORDER — SPIRIVA RESPIMAT 2.5 MCG/ACT IN AERS: 2.0000 | INHALATION_SPRAY | Freq: Every day | RESPIRATORY_TRACT | 11 refills | Status: DC

## 2023-02-11 MED ORDER — AZITHROMYCIN 250 MG PO TABS: ORAL_TABLET | ORAL | 0 refills | Status: DC

## 2023-02-11 MED ORDER — FLUTICASONE-SALMETEROL 230-21 MCG/ACT IN AERO: 2.0000 | INHALATION_SPRAY | Freq: Two times a day (BID) | RESPIRATORY_TRACT | 12 refills | Status: DC

## 2023-02-11 NOTE — Progress Notes (Unsigned)
Subjective:   PATIENT ID: [name] GENDER: male DOB: [birth year], MRN: [id number]   HPI  Chief Complaint  Patient presents with   Follow-up    Feeling bad, losing and gaining 10-15lbs within a mo and more sob than usual   Reason for Visit: Follow-up  [name] is a 52 year old male active smoker with Lynch syndrome, hx of rectal adenocarcinoma s/p chemoradiation and colon resection and proctocolectomy who presents for follow-up.  08/14/20 Initial Consult He is referred to Pulmonary clinic by Dr. Burr Medico. He has had worsening shortness of breath in the last year and was started on Spiriva 2-3 months ago. He has also been using a nebulizer once a night and an albuterol inhaler 2-3 times day. Associated with wheezing. No cough. His dyspnea worsens with activity. He has noticed he now has difficulty mowing the lawn and other activities with moderate to heavy exertion. Rest and the rescue inhalers improve his symptoms. He does not notice any triggers like allergies or changes in weather associated. He is an active smoke and currently smoking 1ppd.   01/26/22 Wife is present and provides history. He reports his shortness of breath has worsened. Difficult with exertion and no longer able to walk to mailbox or do yardwork without having significant exertion. He continues to smoke 1 ppd. Has mixed cough with brown sputum with wheezing. He using his albuterol 6-10 times a day. He takes his Symbicort and Spiriva once a week  02/26/22 On her last visit he was started on Symbicort and Spiriva and has been compliant. He still requires albuterol 3-4 times a day shortness of breath, cough and wheezing. He was also treated for COPD exacerbation. PFTs were scheduled for today. He has noticed a significant difference in his quality of breathing when holding his inhalers for the test. Currently smoking 1/2 ppd which is improved since our last visit.  02/11/23 Since our last visit he has worsened  intermittently productive cough, shortness of breath or wheezing. Walking <200 ft will wipe him out. Unable to take out his trash. Uses nebulizer 1-2 times a week. Compliant with Symbicort and Spiriva. He continues to smoke 2-10 cigarettes daily. Wife reports 10-15 lbs in the last year. Of note he was admitted 7/1 for scrotal cellulitis and had scrotal cyst excision on 09/17/23. He had CT A/P on 06/18/22 LLL 1/2 x 1.1 cm  Social History: Current smoker. 33 pack-years  Past Medical History:  Diagnosis Date   Anxiety    C. 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 In families with MSH6 mutations, screening can start at age 44 years since the onset of colon cancer is later in these families.  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     Family History  Problem Relation Age of Onset   Diabetes Mother    COPD Father  Colon cancer Maternal Aunt        dx in her 21s   Diabetes Maternal Grandmother    Brain cancer Maternal Grandfather    Lung cancer Paternal Grandfather    Colon cancer Cousin        dx in her 89s   Bone cancer Sister 8   Pancreatic cancer Neg Hx    Esophageal cancer Neg Hx    Stomach cancer Neg Hx    Liver disease Neg Hx      Social History   Occupational History   Not on file  Tobacco Use   Smoking status: Every Day     Packs/day: 1.00    Years: 33.00    Total pack years: 33.00    Types: Cigarettes    Start date: 12/20/1985   Smokeless tobacco: Never   Tobacco comments:    smokes 1 pack/day- 08/14/2020  Vaping Use   Vaping Use: Never used  Substance and Sexual Activity   Alcohol use: No   Drug use: No    Comment: patient denies   Sexual activity: Yes    Allergies  Allergen Reactions   Hydroxyzine Other (See Comments)    Made the patient feel not like himself     Outpatient Medications Prior to Visit  Medication Sig Dispense Refill   Accu-Chek Softclix Lancets lancets Use as instructed 100 each 12   acetaminophen (TYLENOL) 500 MG tablet Take 1,000 mg by mouth every 6 (six) hours as needed for fever or mild pain.     albuterol (PROVENTIL) (2.5 MG/3ML) 0.083% nebulizer solution Take 3 mLs (2.5 mg total) by nebulization every 6 (six) hours as needed for wheezing or shortness of breath. 75 mL 0   atorvastatin (LIPITOR) 20 MG tablet TAKE 1 TABLET BY MOUTH EVERY DAY 90 tablet 3   budesonide-formoterol (SYMBICORT) 160-4.5 MCG/ACT inhaler Inhale 2 puffs into the lungs 2 (two) times daily. in the morning and at bedtime. 10.2 each 11   doxycycline (VIBRAMYCIN) 100 MG capsule Take 1 capsule (100 mg total) by mouth every 12 (twelve) hours. (Patient not taking: Reported on 09/08/2022) 20 capsule 0   gabapentin (NEURONTIN) 300 MG capsule Take 2 capsules (600 mg total) by mouth 3 (three) times daily. Please schedule office visit prior to any additional refills. 180 capsule 2   glucose blood (ACCU-CHEK GUIDE) test strip Use to check blood sugar up to 4 times daily 100 strip 12   ibuprofen (ADVIL) 400 MG tablet Take 400 mg by mouth every 8 (eight) hours as needed for moderate pain.     metFORMIN (GLUCOPHAGE) 500 MG tablet TAKE 1 TABLET BY MOUTH EVERY MORNING AND AT BEDTIME. PLEASE SCHEDULE OFFICE VISIT 60 tablet 0   oxyCODONE (OXY IR/ROXICODONE) 5 MG immediate release tablet Take 1 tablet (5 mg total) by mouth every 6  (six) hours as needed for moderate pain. 30 tablet 0   podofilox (CONDYLOX) 0.5 % gel Apply topically 2 (two) times daily. Apply twice daily for 3 days then no treatment for 4 days. Cycle can be repeated for up to 4 times 3.5 g 3   sertraline (ZOLOFT) 100 MG tablet TAKE 1 TABLET BY MOUTH EVERY DAY 90 tablet 0   Tiotropium Bromide Monohydrate (SPIRIVA RESPIMAT) 2.5 MCG/ACT AERS Inhale 2 puffs into the lungs daily. 4 g 11   VENTOLIN HFA 108 (90 Base) MCG/ACT inhaler INHALE 2 PUFFS INTO THE LUNGS EVERY 4 HOURS AS NEEDED 18 each 4   Facility-Administered Medications Prior to Visit  Medication Dose Route Frequency Provider Last Rate Last Admin   sodium chloride flush (NS) 0.9 % injection 10 mL  10 mL Intravenous PRN Truitt Merle, MD   10 mL at 11/18/17 [id number]    Review of Systems  Constitutional:  Negative for chills, diaphoresis, fever, malaise/fatigue and weight loss.  HENT:  Negative for congestion.   Respiratory:  Positive for cough, sputum production, shortness of breath and wheezing. Negative for hemoptysis.   Cardiovascular:  Negative for chest pain, palpitations and leg swelling.     Objective:   Vitals:   02/11/23 1316  BP: 110/68  Pulse: 73  SpO2: 94%  Weight: 167 lb 9.6 oz (76 kg)  Height: '5\' 11"'$  (1.803 m)   Physical Exam: General: Well-appearing, no acute distress HENT: [place], AT Eyes: EOMI, no scleral icterus Respiratory: Clear to auscultation bilaterally.  No crackles, wheezing or rales Cardiovascular: RRR, -M/R/G, no JVD Extremities:-Edema,-tenderness Neuro: AAO x4, CNII-XII grossly intact Psych: Normal mood, normal affect  Data Reviewed:  Imaging: CT Chest 06/19/20 - Interval multiple subcentimeter lung nodules since 06/25/19. Background emphysema CT Chest 08/05/21 - Stable subcentimeter lung nodules. Resolved 67m LUL. Right PAC. Background emphysema.   PFT: 02/26/22 FVC 4.45 (88%) FEV1 1.86 (48%) Ratio 41  TLC 130% DLCO 55% Interpretation: Severe obstructive defect with  air trapping and reduced DLCO consistent with emphysema. Significant FEV1 and FVC bronchodilator response.  Labs: CBC    Component Value Date/Time   WBC 10.4 06/20/2022 0735   RBC 4.62 06/20/2022 0735   HGB 13.3 06/20/2022 0735   HGB 16.4 07/01/2021 1401   HGB 14.9 11/18/2017 0933   HCT 40.7 06/20/2022 0735   HCT 50.8 07/01/2021 1401   HCT 44.6 11/18/2017 0933   PLT 126 (L) 06/20/2022 0735   PLT 216 07/01/2021 1401   MCV 88.1 06/20/2022 0735   MCV 89 07/01/2021 1401   MCV 87.8 11/18/2017 0933   MCH 28.8 06/20/2022 0735   MCHC 32.7 06/20/2022 0735   RDW 14.8 06/20/2022 0735   RDW 13.5 07/01/2021 1401   RDW 14.3 11/18/2017 0933   LYMPHSABS 1.3 06/18/2022 2056   LYMPHSABS 1.3 07/01/2021 1401   LYMPHSABS 0.9 11/18/2017 0933   MONOABS 1.3 (H) 06/18/2022 2056   MONOABS 0.5 11/18/2017 0933   EOSABS 0.1 06/18/2022 2056   EOSABS 0.3 07/01/2021 1401   BASOSABS 0.1 06/18/2022 2056   BASOSABS 0.1 07/01/2021 1401   BASOSABS 0.1 11/18/2017 0933   Absolute eos  07/01/21 - 100 07/24/21 - 200    Assessment & Plan:   Discussion: 52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  PFTs were reviewed which demonstrated findings consistent with severe emphysema. His symptoms are improved but persistent with triple therapy. Discussed clinical course and management of COPD including bronchodilator regimen and action plan for exacerbation.  52year old male active smoker with COPD with emphysema who presents for follow-up.  Severe COPD with emphysema - worsening baseline symptoms, in exacerbation --Prednisone taper --Azithromycin ordered --STOP Symbicort --START Advair 230-21 TWO puffs TWICE a day.  --CONTINUE Spiriva 2.5 mcg TWO puffs ONCE a day --CONTINUE your rescue inhaler (albuterol) as needed --Use nebulizer meds as needed --Encourage regular aerobic exercise  LLL lung nodule >1 cm --ORDER CT Chest without contrast ASAP  Tobacco abuse Patient is an active  smoker. Smokes 2-10 cigarettes daily We discussed smoking cessation for 3 minutes. We discussed triggers and stressors and ways to deal with them. We discussed barriers to continued smoking and benefits of  smoking cessation. Provided patient with information cessation techniques and interventions including [place] quitline.  Health Maintenance Immunization History  Administered Date(s) Administered   Influenza,inj,Quad PF,6+ Mos 09/30/2016, 11/18/2017, 09/15/2018, 09/18/2020   PFIZER(Purple Top)SARS-COV-2 Vaccination 06/17/2020, 07/16/2020   Unspecified SARS-COV-2 Vaccination 06/13/2020   CT Lung Screen - not qualified with abnormal CT  No orders of the defined types were placed in this encounter.  No orders of the defined types were placed in this encounter.  No follow-ups on file.   I have spent a total time of 35-minutes on the day of the appointment reviewing prior documentation, coordinating care and discussing medical diagnosis and plan with the patient/family. Past medical history, allergies, medications were reviewed. Pertinent imaging, labs and tests included in this note have been reviewed and interpreted independently by me.  [place], MD [facility] 02/11/2023 1:26 PM  Office Number [phone]

## 2023-02-11 NOTE — Patient Instructions (Signed)
Severe COPD with emphysema - worsening baseline symptoms, in exacerbation --Prednisone taper --Azithromycin ordered --STOP Symbicort --START Advair 230-21 TWO puffs TWICE a day.  --CONTINUE Spiriva 2.5 mcg TWO puffs ONCE a day --CONTINUE your rescue inhaler (albuterol) as needed --Use nebulizer meds as needed --Encourage regular aerobic exercise  LLL lung nodule >1 cm --ORDER CT Chest without contrast ASAP  Follow-up with me in 3 weeks. Ok to book in blocked spot if needed

## 2023-02-14 ENCOUNTER — Encounter (HOSPITAL_BASED_OUTPATIENT_CLINIC_OR_DEPARTMENT_OTHER): Payer: Self-pay | Admitting: Pulmonary Disease

## 2023-02-16 ENCOUNTER — Ambulatory Visit (HOSPITAL_BASED_OUTPATIENT_CLINIC_OR_DEPARTMENT_OTHER)
Admission: RE | Admit: 2023-02-16 | Discharge: 2023-02-16 | Disposition: A | Discharge status: Home / Self Care | Payer: Medicaid Other | Source: Ambulatory Visit | Attending: Pulmonary Disease | Admitting: Pulmonary Disease

## 2023-02-16 DIAGNOSIS — R911 Solitary pulmonary nodule: Secondary | ICD-10-CM | POA: Diagnosis present

## 2023-02-16 DIAGNOSIS — J432 Centrilobular emphysema: Secondary | ICD-10-CM | POA: Diagnosis not present

## 2023-02-16 DIAGNOSIS — R918 Other nonspecific abnormal finding of lung field: Secondary | ICD-10-CM | POA: Diagnosis not present

## 2023-02-17 ENCOUNTER — Ambulatory Visit (INDEPENDENT_AMBULATORY_CARE_PROVIDER_SITE_OTHER): Payer: Medicaid Other | Admitting: Pulmonary Disease

## 2023-02-17 VITALS — BP 122/80 | HR 79 | Wt 168.6 lb

## 2023-02-17 DIAGNOSIS — J432 Centrilobular emphysema: Secondary | ICD-10-CM

## 2023-02-17 DIAGNOSIS — Z720 Tobacco use: Secondary | ICD-10-CM

## 2023-02-17 DIAGNOSIS — R911 Solitary pulmonary nodule: Secondary | ICD-10-CM

## 2023-02-17 NOTE — Patient Instructions (Addendum)
   Severe COPD with emphysema - slightly improved on Advair --CONTINUE Advair 230-21 TWO puffs TWICE a day.  --CONTINUE Spiriva 2.5 mcg TWO puffs ONCE a day --CONTINUE your rescue inhaler (albuterol) as needed --Use nebulizer meds as needed --Encourage regular aerobic exercise  LLL lung nodule >1 cm - resolved Lung primary vs mets --Enroll in lung screening at age 52 year  Follow-up in 3 months

## 2023-02-17 NOTE — Progress Notes (Signed)
Subjective:   PATIENT ID: [name] GENDER: male DOB: 1971-09-06, MRN: [id number]   HPI  Chief Complaint  Patient presents with   Follow-up    Scan results   Reason for Visit: Follow-up  Mr. Laif Kohlhoff is a 52 year old male active smoker with Lynch syndrome, hx of rectal adenocarcinoma s/p chemoradiation and colon resection and proctocolectomy who presents for follow-up.  08/14/20 Initial Consult He is referred to Pulmonary clinic by Dr. Burr Medico. He has had worsening shortness of breath in the last year and was started on Spiriva 2-3 months ago. He has also been using a nebulizer once a night and an albuterol inhaler 2-3 times day. Associated with wheezing. No cough. His dyspnea worsens with activity. He has noticed he now has difficulty mowing the lawn and other activities with moderate to heavy exertion. Rest and the rescue inhalers improve his symptoms. He does not notice any triggers like allergies or changes in weather associated. He is an active smoke and currently smoking 1ppd.   01/26/22 Wife is present and provides history. He reports his shortness of breath has worsened. Difficult with exertion and no longer able to walk to mailbox or do yardwork without having significant exertion. He continues to smoke 1 ppd. Has mixed cough with brown sputum with wheezing. He using his albuterol 6-10 times a day. He takes his Symbicort and Spiriva once a week  02/26/22 On her last visit he was started on Symbicort and Spiriva and has been compliant. He still requires albuterol 3-4 times a day shortness of breath, cough and wheezing. He was also treated for COPD exacerbation. PFTs were scheduled for today. He has noticed a significant difference in his quality of breathing when holding his inhalers for the test. Currently smoking 1/2 ppd which is improved since our last visit.  02/11/23 Since our last visit he has worsened intermittently productive cough, shortness of breath or wheezing. Walking  <200 ft will wipe him out. Unable to take out his trash. Uses nebulizer 1-2 times a week. Compliant with Symbicort and Spiriva. He continues to smoke 2-10 cigarettes daily. Wife reports 10-15 lbs in the last year. Of note he was admitted 7/1 for scrotal cellulitis and had scrotal cyst excision on 09/17/23. He had CT A/P on 06/18/22 LLL 1/2 x 1.1 cm  02/17/23 Since our last visit, prednisone helped but did cause stomach upset. Improved productive cough and shortness of breath. No longer wheezing. Limited activity due to baseline symptoms.  Social History: Current smoker. 33 pack-years  Past Medical History:  Diagnosis Date   Anxiety    C. 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 In families with MSH6 mutations, screening can start at age 29 years since the onset of colon cancer is later in these families.  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  Family History  Problem Relation Age of Onset   Diabetes Mother    COPD Father    Colon cancer Maternal Aunt        dx in her 69s   Diabetes Maternal Grandmother    Brain cancer Maternal Grandfather    Lung cancer Paternal Grandfather    Colon cancer Cousin        dx in her 26s   Bone cancer Sister 8   Pancreatic cancer Neg Hx    Esophageal cancer Neg Hx    Stomach cancer Neg Hx    Liver disease  Neg Hx      Social History   Occupational History   Not on file  Tobacco Use   Smoking status: Every Day    Packs/day: 1.00    Years: 33.00    Total pack years: 33.00    Types: Cigarettes    Start date: 12/20/1985   Smokeless tobacco: Never   Tobacco comments:    smokes 1 pack/day- 08/14/2020  Vaping Use   Vaping Use: Never used  Substance and Sexual Activity   Alcohol use: No   Drug use: No    Comment: patient denies   Sexual activity: Yes    Allergies  Allergen Reactions   Hydroxyzine Other (See Comments)    Made the patient feel not like himself     Outpatient Medications Prior to Visit  Medication Sig Dispense Refill   Accu-Chek Softclix Lancets lancets Use as instructed 100 each 12   acetaminophen (TYLENOL) 500 MG tablet Take 1,000 mg by mouth every 6 (six) hours as needed for fever or mild pain.     albuterol (PROVENTIL) (2.5 MG/3ML) 0.083% nebulizer solution Take 3 mLs (2.5 mg total) by nebulization every 6 (six) hours as needed for wheezing or shortness of breath. 75 mL 0   albuterol (VENTOLIN HFA) 108 (90 Base) MCG/ACT inhaler Inhale 2 puffs into the lungs every 6 (six) hours as needed for wheezing or shortness of breath. 8 g 2   atorvastatin (LIPITOR) 20 MG tablet TAKE 1 TABLET BY MOUTH EVERY DAY 90 tablet 3   azithromycin (ZITHROMAX) 250 MG tablet Take two tablets on day 1, then one tablet daily on day 2-5. 6 tablet 0   fluticasone-salmeterol (ADVAIR HFA) 230-21 MCG/ACT inhaler Inhale 2 puffs into the lungs 2 (two) times daily. 1 each 12   gabapentin (NEURONTIN) 300 MG capsule Take 2 capsules (600 mg total) by mouth 3 (three) times daily. Please schedule office visit prior to any additional refills. 180 capsule 2   glucose blood (ACCU-CHEK GUIDE) test strip Use to check blood sugar up to 4 times daily 100 strip 12   ibuprofen (ADVIL) 400 MG tablet Take 400 mg by mouth every 8 (eight) hours as needed for moderate pain.     metFORMIN (GLUCOPHAGE) 500 MG tablet TAKE 1  TABLET BY MOUTH EVERY MORNING AND AT BEDTIME. PLEASE SCHEDULE OFFICE VISIT 60 tablet 0   oxyCODONE (OXY IR/ROXICODONE) 5 MG immediate release tablet Take 1 tablet (5 mg total) by mouth every 6 (six) hours as needed for moderate pain. 30 tablet 0   podofilox (CONDYLOX) 0.5 % gel Apply topically 2 (two) times daily. Apply twice daily for 3 days then no treatment for 4 days. Cycle can be repeated for up to 4 times 3.5 g 3   predniSONE (DELTASONE) 10 MG tablet Take 4 tablets (40 mg total) by mouth daily with breakfast for 2 days, THEN 3 tablets (30 mg  total) daily with breakfast for 2 days, THEN 2 tablets (20 mg total) daily with breakfast for 2 days, THEN 1 tablet (10 mg total) daily with breakfast for 2 days. 20 tablet 0   sertraline (ZOLOFT) 100 MG tablet TAKE 1 TABLET BY MOUTH EVERY DAY 90 tablet 0   Tiotropium Bromide Monohydrate (SPIRIVA RESPIMAT) 2.5 MCG/ACT AERS Inhale 2 puffs into the lungs daily. 4 g 11   Facility-Administered Medications Prior to Visit  Medication Dose Route Frequency Provider Last Rate Last Admin   sodium chloride flush (NS) 0.9 % injection 10 mL  10 mL Intravenous PRN Truitt Merle, MD   10 mL at 11/18/17 [id number]    Review of Systems  Constitutional:  Negative for chills, diaphoresis, fever, malaise/fatigue and weight loss.  HENT:  Negative for congestion.   Respiratory:  Positive for cough, hemoptysis and shortness of breath. Negative for sputum production and wheezing.   Cardiovascular:  Negative for chest pain, palpitations and leg swelling.     Objective:   Vitals:   02/17/23 1516  BP: 122/80  Pulse: 79  SpO2: 95%  Weight: 168 lb 9.6 oz (76.5 kg)   Physical Exam: General: Thin, chronically ill-appearing, no acute distress HENT: [place], AT Eyes: EOMI, no scleral icterus Respiratory: Diminished breath sounds to auscultation bilaterally.  No crackles, wheezing or rales Cardiovascular: RRR, -M/R/G, no JVD Extremities:-Edema,-tenderness Neuro: AAO x4, CNII-XII  grossly intact Psych: Normal mood, normal affect  Data Reviewed:  Imaging: CT Chest 06/19/20 - Interval multiple subcentimeter lung nodules since 06/25/19. Background emphysema CT Chest 08/05/21 - Stable subcentimeter lung nodules. Resolved 52m LUL. Right PAC. Background emphysema.  CT A/P on 06/18/22 -  Interval development of spiculated LLL 1/2 x 1.1 cm. Severe emphysema CT Chest 02/16/23 - Multiple scattered pulmonary nodules, stable <5 mm. Prior LLL nodule resolved, likely inflammatory/infectious. Severe centrilobular emphysema.   PFT: 02/26/22 FVC 4.45 (88%) FEV1 1.86 (48%) Ratio 41  TLC 130% DLCO 55% Interpretation: Severe obstructive defect with air trapping and reduced DLCO consistent with emphysema. Significant FEV1 and FVC bronchodilator response.  Labs: CBC    Component Value Date/Time   WBC 10.4 06/20/2022 0735   RBC 4.62 06/20/2022 0735   HGB 13.3 06/20/2022 0735   HGB 16.4 07/01/2021 1401   HGB 14.9 11/18/2017 0933   HCT 40.7 06/20/2022 0735   HCT 50.8 07/01/2021 1401   HCT 44.6 11/18/2017 0933   PLT 126 (L) 06/20/2022 0735   PLT 216 07/01/2021 1401   MCV 88.1 06/20/2022 0735   MCV 89 07/01/2021 1401   MCV 87.8 11/18/2017 0933   MCH 28.8 06/20/2022 0735   MCHC 32.7 06/20/2022 0735   RDW 14.8 06/20/2022 0735   RDW 13.5 07/01/2021 1401   RDW 14.3 11/18/2017 0933   LYMPHSABS 1.3 06/18/2022 2056   LYMPHSABS 1.3 07/01/2021 1401   LYMPHSABS 0.9 11/18/2017 0933   MONOABS 1.3 (H) 06/18/2022 2056   MONOABS 0.5 11/18/2017 0933   EOSABS 0.1 06/18/2022 2056   EOSABS 0.3 07/01/2021 1401   BASOSABS 0.1 06/18/2022 2056   BASOSABS 0.1 07/01/2021 1401   BASOSABS 0.1 11/18/2017 0933   Absolute eos  07/01/21 - 100 07/24/21 - 200    Assessment & Plan:   Discussion: 52year old male active smoker with COPD with emphysema, Lynch syndrome, hx of rectal adenocarcinoma s/p chemoradiation and colon resection and proctocolectomy who presents for follow-up. COPD exacerbation improved.  Baseline symptoms improved on increased ICS/LABA. Reviewed CT with resolved LLL nodule likely inflammatory/infectious in nature.  No indication for bronchoscopy    Severe COPD with emphysema - slightly improved on Advair --CONTINUE Advair 230-21 TWO puffs TWICE a day.  --CONTINUE Spiriva 2.5 mcg TWO puffs ONCE a day --CONTINUE your rescue inhaler (albuterol) as needed --Use nebulizer meds as needed --Encourage regular aerobic exercise  LLL lung nodule >1 cm - resolved Lung primary vs mets --Enroll in lung screening at age 31 year  Tobacco abuse Patient is an active smoker. Smokes 2-10 cigarettes daily We discussed smoking cessation for 3 minutes. We discussed triggers and stressors and ways to deal with them. We discussed barriers to continued smoking and benefits of smoking cessation. Provided patient with information cessation techniques and interventions including [place] quitline.  Health Maintenance Immunization History  Administered Date(s) Administered   Influenza,inj,Quad PF,6+ Mos 09/30/2016, 11/18/2017, 09/15/2018, 09/18/2020   PFIZER(Purple Top)SARS-COV-2 Vaccination 06/17/2020, 07/16/2020   Unspecified SARS-COV-2 Vaccination 06/13/2020   CT Lung Screen - not qualified with abnormal CT  No orders of the defined types were placed in this encounter.  No orders of the defined types were placed in this encounter.  Return in about 3 months (around 05/18/2023).   I have spent a total time of 35-minutes on the day of the appointment including chart review, data review, collecting history, coordinating care and discussing medical diagnosis and plan with the patient/family. Past medical history, allergies, medications were reviewed. Pertinent imaging, labs and tests included in this note have been reviewed and interpreted independently by me.  [place], MD [facility] 02/17/2023 3:22 PM  Office Number [phone]

## 2023-02-18 ENCOUNTER — Encounter (HOSPITAL_BASED_OUTPATIENT_CLINIC_OR_DEPARTMENT_OTHER): Payer: Self-pay | Admitting: Pulmonary Disease

## 2023-02-18 NOTE — Telephone Encounter (Signed)
Mychart message sent by pt:  [name] [facility] (supporting Chi Rodman Pickle, MD)1 hour ago (7:09 AM)    Dr. Loanne Drilling CT Scan results are available. Is there any changes noted that we need to be worried about?    [name]    Routing to Dr. Loanne Drilling for review.

## 2023-02-21 ENCOUNTER — Encounter (HOSPITAL_BASED_OUTPATIENT_CLINIC_OR_DEPARTMENT_OTHER): Payer: Self-pay | Admitting: Pulmonary Disease

## 2023-02-22 MED ORDER — AEROCHAMBER MV MISC: 0 refills | Status: DC

## 2023-02-22 MED ORDER — NYSTATIN 100000 UNIT/ML MT SUSP: 5.0000 mL | Freq: Four times a day (QID) | OROMUCOSAL | 0 refills | Status: DC

## 2023-02-22 NOTE — Telephone Encounter (Signed)
Mychart message sent by pt's significant other:  [name]  P [facility] Clinical Pool (supporting Chi Rodman Pickle, MD)14 hours ago (6:52 PM)    Dr. Loanne Drilling, [name] has got thrush in his mouth from the new inhaler. Can you send in a prescription please.   [name]    Dr. Loanne Drilling, please advise.

## 2023-02-22 NOTE — Telephone Encounter (Signed)
Do we have a spacer to give to the patient? If not, please order. Also patient will need instructions on how to use spacer with inhaler  Nystatin also ordered for patient.  He agrees to plan above with spacer and nystatin

## 2023-02-26 ENCOUNTER — Other Ambulatory Visit (HOSPITAL_BASED_OUTPATIENT_CLINIC_OR_DEPARTMENT_OTHER): Payer: Self-pay | Admitting: Pulmonary Disease

## 2023-02-27 DIAGNOSIS — Z932 Ileostomy status: Secondary | ICD-10-CM | POA: Diagnosis not present

## 2023-03-01 NOTE — Telephone Encounter (Signed)
Please advise 

## 2023-03-07 ENCOUNTER — Ambulatory Visit (HOSPITAL_BASED_OUTPATIENT_CLINIC_OR_DEPARTMENT_OTHER): Payer: Medicaid Other | Admitting: Pulmonary Disease

## 2023-03-29 DIAGNOSIS — Z932 Ileostomy status: Secondary | ICD-10-CM | POA: Diagnosis not present

## 2023-04-28 DIAGNOSIS — Z932 Ileostomy status: Secondary | ICD-10-CM | POA: Diagnosis not present

## 2023-06-01 ENCOUNTER — Ambulatory Visit (HOSPITAL_BASED_OUTPATIENT_CLINIC_OR_DEPARTMENT_OTHER): Payer: Medicaid Other | Admitting: Pulmonary Disease

## 2023-06-18 DIAGNOSIS — Z932 Ileostomy status: Secondary | ICD-10-CM | POA: Diagnosis not present

## 2023-07-04 ENCOUNTER — Ambulatory Visit (HOSPITAL_BASED_OUTPATIENT_CLINIC_OR_DEPARTMENT_OTHER): Payer: Medicaid Other | Admitting: Pulmonary Disease

## 2023-08-30 ENCOUNTER — Other Ambulatory Visit: Payer: Self-pay | Admitting: Pharmacist

## 2023-09-24 ENCOUNTER — Other Ambulatory Visit (HOSPITAL_BASED_OUTPATIENT_CLINIC_OR_DEPARTMENT_OTHER): Payer: Self-pay | Admitting: Pulmonary Disease

## 2023-11-02 ENCOUNTER — Encounter: Payer: Self-pay | Admitting: Family Medicine

## 2023-11-02 ENCOUNTER — Ambulatory Visit (INDEPENDENT_AMBULATORY_CARE_PROVIDER_SITE_OTHER): Payer: Medicaid Other | Admitting: Family Medicine

## 2023-11-02 VITALS — BP 108/70 | HR 72 | Ht 71.0 in | Wt 164.2 lb

## 2023-11-02 DIAGNOSIS — Z1159 Encounter for screening for other viral diseases: Secondary | ICD-10-CM | POA: Diagnosis not present

## 2023-11-02 DIAGNOSIS — E1165 Type 2 diabetes mellitus with hyperglycemia: Secondary | ICD-10-CM

## 2023-11-02 DIAGNOSIS — G44219 Episodic tension-type headache, not intractable: Secondary | ICD-10-CM | POA: Diagnosis not present

## 2023-11-02 DIAGNOSIS — K529 Noninfective gastroenteritis and colitis, unspecified: Secondary | ICD-10-CM

## 2023-11-02 LAB — POCT GLYCOSYLATED HEMOGLOBIN (HGB A1C): HbA1c, POC (controlled diabetic range): 8.1 % — AB (ref 0.0–7.0)

## 2023-11-02 LAB — GLUCOSE, POCT (MANUAL RESULT ENTRY): POC Glucose: 279 mg/dL — AB (ref 70–99)

## 2023-11-02 MED ORDER — LOPERAMIDE HCL 2 MG PO CAPS: 2.0000 mg | ORAL_CAPSULE | Freq: Every day | ORAL | 0 refills | Status: DC

## 2023-11-02 MED ORDER — EMPAGLIFLOZIN 10 MG PO TABS: 10.0000 mg | ORAL_TABLET | Freq: Every day | ORAL | 0 refills | Status: DC

## 2023-11-02 NOTE — Progress Notes (Signed)
    SUBJECTIVE:   CHIEF COMPLAINT / HPI:   Hyperglycemia  Patient and wife present today because they checked patient's blood sugar and noticed sugars in the 300s. 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 He is only on metformin 500 BID as he has a colostomy and has high ouput which is further exacerbated by metformin. Endorses polyuria and polydipsia. Is interested in a CGM and improving his A1c.   Headache  Patient reports headache for the last 3 days. Denies fevers, vision changes, neurologic changes. Says he feels this way whenever he is dehydrated. Has been having high output from his colostomy along with polyuria. He has not been following up with his general surgeon as he had bad experiences with him and has not followed up. Says he tried imodium long ago but has not been on it recently. Denies vomiting or nausea.   PERTINENT  PMH / PSH: Lynch syndrome and adenocarcinoma s/p colectomy, T2DM, tobacco use   OBJECTIVE:   BP 108/70   Pulse 72   Ht 5\' 11"  (1.803 m)   Wt 164 lb 3.2 oz (74.5 kg)   SpO2 99%   BMI 22.90 kg/m   General: Chronically ill appearing, in no acute distress CV: RRR, radial pulses equal and palpable, no BLE edema  Resp: Normal work of breathing on room air, CTAB Abd: Soft, non tender, non distended  Neuro: Alert & Oriented x 4, PERRLA, EOMI, CN2-12 intact, upper and lower extremity strength intact    ASSESSMENT/PLAN:   Assessment & Plan Type 2 diabetes mellitus with hyperglycemia, without long-term current use of insulin (HCC) A1c worsened to 8.1.  - Appt with Dr. Raymondo Band for application of CGM with Josephine Igo study to help improve lifestyle choices and enable faster changes in medication regimen. 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 Seems mildly dehydrated.  - Start imodium  - Referral to GI   Episodic tension-type headache, not intractable Less concerned about headache as this is normal for patient. Not hypertensive no specific neurologic changes. Could be due to dehydration or migraine caused by elevated sugars.  - BMP  - Recommended frequent hydration and electrolytes  - Imodium for diarrhea    Follow up with PCP to follow up symptoms as well as physical as patient has not seen a PCP in about 1-2 years   Lockie Mola, MD [facility]

## 2023-11-02 NOTE — Patient Instructions (Signed)
It was wonderful to see you today.  Please bring ALL of your medications with you to every visit.   Today we talked about:  High sugars - I believe your blood sugars have been high for a while given your A1c. I am starting a medication called jardiance today that should help. You have an appointment with Dr. Raymondo Band to get a CGM monitor for blood sugars.   For your dehydration, as your A1c improves and you are not urinating as much this should improve. I am hoping that the immodium will also help.  Please go to your follow up with Dr. Threasa Beards in one week and if you have concerns sooner please let us know   Thank you for choosing [facility].   Please call [phone] with any questions about today's appointment.  Lockie Mola, MD  Family Medicine

## 2023-11-03 ENCOUNTER — Ambulatory Visit (INDEPENDENT_AMBULATORY_CARE_PROVIDER_SITE_OTHER): Payer: Medicaid Other | Admitting: Pharmacist

## 2023-11-03 VITALS — BP 121/85 | HR 96 | Wt 163.4 lb

## 2023-11-03 DIAGNOSIS — Z720 Tobacco use: Secondary | ICD-10-CM

## 2023-11-03 DIAGNOSIS — E1165 Type 2 diabetes mellitus with hyperglycemia: Secondary | ICD-10-CM | POA: Diagnosis not present

## 2023-11-03 LAB — COMPREHENSIVE METABOLIC PANEL
ALT: 17 [IU]/L (ref 0–44)
AST: 16 [IU]/L (ref 0–40)
Albumin: 3.3 g/dL — ABNORMAL LOW (ref 3.8–4.9)
Alkaline Phosphatase: 64 [IU]/L (ref 44–121)
BUN/Creatinine Ratio: 14 (ref 9–20)
BUN: 10 mg/dL (ref 6–24)
Bilirubin Total: <0.2 mg/dL (ref 0.0–1.2)
CO2: 22 mmol/L (ref 20–29)
Calcium: 8.2 mg/dL — ABNORMAL LOW (ref 8.7–10.2)
Chloride: 104 mmol/L (ref 96–106)
Creatinine, Ser: 0.73 mg/dL — ABNORMAL LOW (ref 0.76–1.27)
Globulin, Total: 1.9 g/dL (ref 1.5–4.5)
Glucose: 247 mg/dL — ABNORMAL HIGH (ref 70–99)
Potassium: 4.3 mmol/L (ref 3.5–5.2)
Sodium: 138 mmol/L (ref 134–144)
Total Protein: 5.2 g/dL — ABNORMAL LOW (ref 6.0–8.5)
eGFR: 109 mL/min/{1.73_m2} (ref >59)

## 2023-11-03 LAB — HEPATITIS C ANTIBODY: Hep C Virus Ab: NONREACTIVE

## 2023-11-03 LAB — MICROALBUMIN / CREATININE URINE RATIO
Creatinine, Urine: 108.5 mg/dL
Microalb/Creat Ratio: <3 mg/g{creat} (ref 0–29)
Microalbumin, Urine: <3 ug/mL

## 2023-11-03 LAB — MAGNESIUM: Magnesium: 2.2 mg/dL (ref 1.6–2.3)

## 2023-11-03 NOTE — Progress Notes (Signed)
S:     Chief Complaint  Patient presents with   Medication Management    T2DM follow-up -- LIBERATE study day 1   52 y.o. male who presents for diabetes evaluation, education, and management. Patient arrives in good spirits. Patient is accompanied by his partner, Rosanne Sack, who manages his medications.   Patient was referred and last seen by Primary Care Provider, Dr. Threasa Beards, on 11/02/23.   PMH is significant for T2DM, HLD, COPD, tobacco use dependence.   At last visit, discussed with PCP to get a CGM. He is a candidate for the LIBERATE study with an A1C of 8.1% and not on insulin. He was started on Jardiance and is waiting on the PA to be approved to start.   Patient reports Diabetes was diagnosed in 2021.   Family/Social History: mother, grandmother, aunt (T2DM)  Current diabetes medications include: metformin 500 mg BID, Jardiance (empagliflozin) 10 mg daily -- has not started, waiting on PA. Current hyperlipidemia medications include: atorvastatin 20 mg daily -- has not taken in a while.  Patient denies adherence with medications, reports missing all medications 7 times per week, on average, until last PCP visit. Insurance coverage: [place] Medicaid  Patient denies hypoglycemic events.  Reported home fasting blood sugars: 175 Reported 2 hour post-meal/random blood sugars: 310, 273, 292, 275, 273.  Patient reports nocturia (nighttime urination) 2-3 times daily.  Patient reports neuropathy (nerve pain), with some relief with gabapentin. Patient denies visual changes. Last eye exam 2 years ago.  Patient reports self foot exams.   Patient reported dietary habits: Eats 2 meals/day Breakfast: eggs, sausage Dinner: spaghetti Snacks: fudge rounds -- "not a big snacker" Drinks: LOTS of sweet drinks, sodas, sweet tea  Within the past 12 months, did you worry whether your food would run out before you got money to buy more? no Within the past 12 months, did the food you bought run out,  and you didn't have money to get more? no  Patient-reported exercise habits: walking daily   O:   Review of Systems  Respiratory:  Positive for cough and wheezing.     Physical Exam Constitutional:      Appearance: Normal appearance.  Pulmonary:     Effort: Pulmonary effort is normal.  Neurological:     Mental Status: He is alert.  Psychiatric:        Mood and Affect: Mood normal.        Behavior: Behavior normal.        Thought Content: Thought content normal.        Judgment: Judgment normal.    Lab Results  Component Value Date   HGBA1C 8.1 (A) 11/02/2023   Vitals:   11/03/23 1449  BP: 121/85  Pulse: 96  SpO2: 98%    Lipid Panel     Component Value Date/Time   CHOL 147 09/25/2020 1001   TRIG 140 09/25/2020 1001   HDL 43 09/25/2020 1001   CHOLHDL 3.4 09/25/2020 1001   LDLCALC 79 09/25/2020 1001   Patient is participating in a Managed Medicaid Plan:  Yes   A/P: Diabetes longstanding currently uncontrolled. Patient is able to verbalize appropriate hypoglycemia management plan, his partner is aware as well. Medication adherence appears poor. Control is suboptimal due to not taking any medications. -- Placed Freestyle Libre 3 sensor today and educated on placement and use. Started in LIBERATE CGM study and given ~2 month supply of sensors. - Continue Metformin 500mg  BID - Start Jardiance (empagliflozin) 10mg   daily when available (pending PA approval) -Patient educated on purpose, proper use, and potential adverse effects.  -Extensively discussed pathophysiology of diabetes, recommended lifestyle interventions, dietary effects on blood sugar control.  -Counseled on s/sx of and management of hypoglycemia.   ASCVD risk - primary  prevention in patient with diabetes. Last LDL is 79 not at goal of <70 mg/dL. ASCVD risk factors include T2DM, tobacco dependence. -- Patient reports having not taken atorvastatin for "a long time". He has a physical coming up on 11/22 and  says he needs updated labs. -- Restart atorvastatin 20 mg after physical.  Tobacco use disorder: patient has a sporadic smoking history, will smoke a pack over two days and then go several days without. He does not express any interest in quitting at this time.  -- Continue Ventolin (albuterol), Spiriva (tiotropium), and albuterol nebulizer   -- Continue to encourage tobacco intake reduction / cessation  Written patient instructions provided. Patient verbalized understanding of treatment plan.  Total time in face to face counseling 46 minutes.    Follow-up:  Pharmacist 12/01/23 PCP clinic visit 11/11/23. Patient seen with Lendon Ka, PharmD Candidate and Shona Simpson, PharmD Candidate.

## 2023-11-03 NOTE — Assessment & Plan Note (Signed)
Most likely dysmotility due to the colectomy and a chronic issue. Seems mildly dehydrated.  - Start imodium  - Referral to GI

## 2023-11-03 NOTE — Assessment & Plan Note (Signed)
Diabetes longstanding currently uncontrolled. Patient is able to verbalize appropriate hypoglycemia management plan, his partner is aware as well. Medication adherence appears poor. Control is suboptimal due to not taking any medications. -- Placed Freestyle Libre 3 sensor today and educated on placement and use. Started in LIBERATE CGM study and given ~2 month supply of sensors. - Continue Metformin 500mg  BID - Start Jardiance (empagliflozin) 10mg  daily when available (pending PA approval) -Patient educated on purpose, proper use, and potential adverse effects.  -Extensively discussed pathophysiology of diabetes, recommended lifestyle interventions, dietary effects on blood sugar control.  -Counseled on s/sx of and management of hypoglycemia.

## 2023-11-03 NOTE — Patient Instructions (Signed)
It was nice to see you today!  Your goal blood sugar is 80-130 before eating and less than 180 after eating.  Medication Changes: Continue all other medications the same.  Monitor blood sugars at home and keep a log (glucometer or piece of paper) to bring with you to your next visit.  Keep up the good work with diet and exercise. Aim for a diet full of vegetables, fruit and lean meats (chicken, [place], fish). Try to limit salt intake by eating fresh or frozen vegetables (instead of canned), rinse canned vegetables prior to cooking and do not add any additional salt to meals.   It was a pleasure seeing you today!  The sensor is small waterproof disc that is placed on the back of the upper arm.  There is a very thin filament that is inserted under the surface of the skin and measures the amount of glucose in the interstitial fluid.  This system collects your sugar levels for up to 14 days and it automatically records the glucose level every 15 minutes. This will show your provider any patterns in your glucose levels.  Please remember... 1. Sensor will last 14 days 2. Sensor should be applied to area away from scarring, tattoos, irritation, and bones. 3. Starting the sensor: 1 hour warm up before BG readings available   4. Scan the sensor at least every 8 hours for the FreeStyle Libre 2. Libre 3 does not require scanning.  5. Hold reader within 1.5 inches of sensor to scan 6. When the blood drop and magnifying glass symbol appears, test fingerstick blood glucose prior to making treatment decisions 7. Do a fingerstick blood glucose test if the sensor readings do not match how you feel 8. Remove sensor prior to magnetic resonance imaging (MRI), computed tomography (CT) scan, or high-frequency electrical heat (diathermy) treatment. 9. Freestyle Libre may be worn through a Industrial/product designer. It may not be exposed to an advanced Imaging Technology (AIT) body scanner (also called a  millimeter wave scanner) or the baggage x-ray machine. Instead, ask for hand-wanding or full-body pat-down and visual inspection.  10. Doses of vitamin C (ascorbic acid) >500 mg every day may cause false high readings. 11. Do not submerge more than 3 feet or keep underwater longer than 30 minutes at a time. Gently pat to dry.  12. Store sensor kit between 39 and 77 degrees Farenheit. Can be refrigerated within this temperature range.  Problems with Freestyle Libre sticking? 1. Order Tegaderm I.V. films to place directly over [facility] sensor on arm. 2. May also order Skin Tac from [facility]. Alcohol swab area you plan to administer Freestyle Libre then let dry. Once dry, apply Skin Tac in a circular motion (with a spot in the middle for sensor without skin tac) and let dry. Once dry you can apply Freestyle Libre   Problems taking off Freestyle [place]? 1. Remember to try to shower before removing Freestyle Libre 2. Order Tac Away to help remove any extra adhesive left on your skin once you remove Freestyle Libre 3. May also try baby oil to loosen adhesive  Freestyle [facility] Phone number: [phone] Available 7 days a week; excluding holidays 8 AM to 8PM EST  [email or web address]  Hypoglycemia or low blood sugar:   Low blood sugar can happen quickly and may become an emergency if not treated right away.   While this shouldn't happen often, it can be brought upon if you skip a meal or do not eat  enough. Also, if your insulin or other diabetes medications are dosed too high, this can cause your blood sugar to go to low.   Warning signs of low blood sugar include: Feeling shaky or dizzy Feeling weak or tired  Excessive hunger Feeling anxious or upset  Sweating even when you aren't exercising  What to do if I experience low blood sugar? Follow the Rule of 15 Check your blood sugar. If lower than 70, proceed to step 2.  Treat with 15 grams of fast acting carbs which is  found in 3-4 glucose tablets. If none are available you can try hard candy, 1 tablespoon of sugar or honey,4 ounces of fruit juice, or 6 ounces of REGULAR soda.  Re-check your sugar in 15 minutes. If it is still below 70, do what you did in step 2 again. If your blood sugar has come back up, go ahead and eat a snack or small meal made up of complex carbs (ex. Whole grains) and protein at this time to avoid recurrence of low blood sugar.

## 2023-11-03 NOTE — Assessment & Plan Note (Signed)
Tobacco use disorder: patient has a sporadic smoking history, will smoke a pack over two days and then go several days without. He does not express any interest in quitting at this time.  -- Continue Ventolin (albuterol), Spiriva (tiotropium), and albuterol nebulizer   -- Continue to encourage tobacco intake reduction / cessation

## 2023-11-03 NOTE — Assessment & Plan Note (Signed)
A1c worsened to 8.1.  - Appt with Dr. Raymondo Band for application of CGM with Josephine Igo study to help improve lifestyle choices and enable faster changes in medication regimen. Additionally allow patient to measure sugars without finger sticks with neuropathy  - Start jardiance 25 mg, continue metformin 500 BID

## 2023-11-04 NOTE — Progress Notes (Signed)
Reviewed and agree with Dr Koval's plan.   

## 2023-11-11 ENCOUNTER — Encounter: Payer: Self-pay | Admitting: Family Medicine

## 2023-11-14 ENCOUNTER — Telehealth: Payer: Self-pay

## 2023-11-14 NOTE — Telephone Encounter (Signed)
Pharmacy Patient Advocate Encounter   Received notification from CoverMyMeds that prior authorization for jardiance is required/requested.   Insurance verification completed.   The patient is insured through [facility] MEDICAID .   PA required; PA submitted to above mentioned insurance via CoverMyMeds Key/confirmation #/EOC [id number]. Status is pending

## 2023-11-16 NOTE — Telephone Encounter (Signed)
Pharmacy Patient Advocate Encounter  Received notification from [facility] MEDICAID that Prior Authorization for JARDIANCE has been APPROVED from 11/14/23 to 11/13/24   PA #/Case ID/Reference #:  [id number]

## 2023-11-22 ENCOUNTER — Other Ambulatory Visit: Payer: Self-pay | Admitting: Family Medicine

## 2023-11-22 DIAGNOSIS — E1165 Type 2 diabetes mellitus with hyperglycemia: Secondary | ICD-10-CM

## 2023-11-28 MED ORDER — EMPAGLIFLOZIN 10 MG PO TABS: 10.0000 mg | ORAL_TABLET | Freq: Every day | ORAL | 0 refills | Status: DC

## 2023-12-01 ENCOUNTER — Ambulatory Visit: Payer: Medicaid Other | Admitting: Pharmacist

## 2023-12-02 ENCOUNTER — Telehealth: Payer: Self-pay | Admitting: Pharmacist

## 2023-12-02 NOTE — Telephone Encounter (Signed)
Attempted to contact patient for follow-up of missed appointment 12/01/2023 for DM management - Liberate study.    Unable to leave any message.  I would like patient to reschedule  12/16-12/20/2024 OR in early January  Total time with patient call and documentation of interaction: 7 minutes.  Follow-up phone call planned: early January if unseen

## 2023-12-22 ENCOUNTER — Telehealth: Payer: Self-pay | Admitting: Pharmacist

## 2023-12-22 NOTE — Telephone Encounter (Signed)
 Attempted to contact patient for follow-up of CGM - Liberate study follow-up.    Liberate enrollment 11/03/2023 Due for 3 month visit mid 01/2024  Left HIPAA compliant voice mail requesting call back to reschedule: [phone]  Total time with patient call and documentation of interaction: 9 minutes.

## 2024-01-04 ENCOUNTER — Encounter: Payer: Self-pay | Admitting: Physician Assistant

## 2024-01-14 DIAGNOSIS — H5213 Myopia, bilateral: Secondary | ICD-10-CM | POA: Diagnosis not present

## 2024-01-27 ENCOUNTER — Ambulatory Visit: Payer: Medicaid Other | Admitting: Physician Assistant

## 2024-03-01 ENCOUNTER — Other Ambulatory Visit (HOSPITAL_BASED_OUTPATIENT_CLINIC_OR_DEPARTMENT_OTHER): Payer: Self-pay | Admitting: Pulmonary Disease

## 2024-03-09 NOTE — Progress Notes (Deleted)
 03/09/2024 [name] [id number] [birth year]  Referring provider: Ivery Quale, MD Primary GI doctor: Dr. Myrtie Neither  ASSESSMENT AND PLAN:   Assessment and Plan            Patient Care Team: Ivery Quale, MD as PCP - Erline Hau, MD as Consulting Physician (General Surgery) [name], Andreas Blower, MD as Consulting Physician (Gastroenterology) Dorothy Puffer, MD as Consulting Physician (Radiation Oncology) Malachy Mood, MD as Consulting Physician (Oncology) Luciano Cutter, MD as Consulting Physician (Pulmonary Disease)  HISTORY OF PRESENT ILLNESS: 53 y.o. male known to Dr. Myrtie Neither presents with chronic diarrhea.  Discussed the use of AI scribe software for clinical note transcription with the patient, who gave verbal consent to proceed.  History of Present Illness            He  reports that he has been smoking cigarettes. He started smoking about 38 years ago. He has a 38.2 pack-year smoking history. He has never used smokeless tobacco. He reports that he does not drink alcohol and does not use drugs.  RELEVANT GI HISTORY, IMAGING AND LABS: Results          CBC    Component Value Date/Time   WBC 10.4 06/20/2022 0735   RBC 4.62 06/20/2022 0735   HGB 13.3 06/20/2022 0735   HGB 16.4 07/01/2021 1401   HGB 14.9 11/18/2017 0933   HCT 40.7 06/20/2022 0735   HCT 50.8 07/01/2021 1401   HCT 44.6 11/18/2017 0933   PLT 126 (L) 06/20/2022 0735   PLT 216 07/01/2021 1401   MCV 88.1 06/20/2022 0735   MCV 89 07/01/2021 1401   MCV 87.8 11/18/2017 0933   MCH 28.8 06/20/2022 0735   MCHC 32.7 06/20/2022 0735   RDW 14.8 06/20/2022 0735   RDW 13.5 07/01/2021 1401   RDW 14.3 11/18/2017 0933   LYMPHSABS 1.3 06/18/2022 2056   LYMPHSABS 1.3 07/01/2021 1401   LYMPHSABS 0.9 11/18/2017 0933   MONOABS 1.3 (H) 06/18/2022 2056   MONOABS 0.5 11/18/2017 0933   EOSABS 0.1 06/18/2022 2056   EOSABS 0.3 07/01/2021 1401   BASOSABS 0.1 06/18/2022 2056   BASOSABS 0.1 07/01/2021 1401    BASOSABS 0.1 11/18/2017 0933   No results for input(s): "HGB" in the last 8760 hours.  CMP     Component Value Date/Time   NA 138 11/02/2023 1023   NA 139 11/18/2017 0933   K 4.3 11/02/2023 1023   K 3.6 11/18/2017 0933   CL 104 11/02/2023 1023   CO2 22 11/02/2023 1023   CO2 26 11/18/2017 0933   GLUCOSE 247 (H) 11/02/2023 1023   GLUCOSE 143 (H) 06/20/2022 0735   GLUCOSE 104 11/18/2017 0933   BUN 10 11/02/2023 1023   BUN 8.3 11/18/2017 0933   CREATININE 0.73 (L) 11/02/2023 1023   CREATININE 0.85 06/26/2020 1300   CREATININE 0.9 11/18/2017 0933   CALCIUM 8.2 (L) 11/02/2023 1023   CALCIUM 8.7 11/18/2017 0933   PROT 5.2 (L) 11/02/2023 1023   PROT 4.9 (L) 11/18/2017 0933   ALBUMIN 3.3 (L) 11/02/2023 1023   ALBUMIN 2.5 (L) 11/18/2017 0933   AST 16 11/02/2023 1023   AST 30 06/26/2020 1300   AST 24 11/18/2017 0933   ALT 17 11/02/2023 1023   ALT 40 06/26/2020 1300   ALT 15 11/18/2017 0933   ALKPHOS 64 11/02/2023 1023   ALKPHOS 61 11/18/2017 0933   BILITOT <0.2 11/02/2023 1023   BILITOT 0.2 (L) 06/26/2020 1300   BILITOT  0.33 11/18/2017 0933   GFRNONAA >60 06/20/2022 0735   GFRNONAA >60 06/26/2020 1300   GFRAA 107 10/24/2020 0940   GFRAA >60 06/26/2020 1300      Latest Ref Rng & Units 11/02/2023   10:23 AM 06/20/2022    7:35 AM 06/19/2022    2:05 PM  Hepatic Function  Total Protein 6.0 - 8.5 g/dL 5.2  5.2  5.5   Albumin 3.8 - 4.9 g/dL 3.3  2.3  2.5   AST 0 - 40 IU/L 16  13  16    ALT 0 - 44 IU/L 17  17  20    Alk Phosphatase 44 - 121 IU/L 64  45  50   Total Bilirubin 0.0 - 1.2 mg/dL <5.9  0.5  0.7       Current Medications:   Current Outpatient Medications (Endocrine & Metabolic):    empagliflozin (JARDIANCE) 10 MG TABS tablet, Take 1 tablet (10 mg total) by mouth daily.   metFORMIN (GLUCOPHAGE) 500 MG tablet, TAKE 1 TABLET BY MOUTH EVERY MORNING AND AT BEDTIME. PLEASE SCHEDULE OFFICE VISIT   Current Outpatient Medications (Cardiovascular):    atorvastatin (LIPITOR)  20 MG tablet, TAKE 1 TABLET BY MOUTH EVERY DAY (Patient not taking: Reported on 11/03/2023)   Current Outpatient Medications (Respiratory):    albuterol (PROVENTIL) (2.5 MG/3ML) 0.083% nebulizer solution, Take 3 mLs (2.5 mg total) by nebulization every 6 (six) hours as needed for wheezing or shortness of breath.   albuterol (VENTOLIN HFA) 108 (90 Base) MCG/ACT inhaler, Inhale 2 puffs into the lungs every 6 (six) hours as needed for wheezing or shortness of breath.   fluticasone-salmeterol (ADVAIR HFA) 230-21 MCG/ACT inhaler, Inhale 2 puffs into the lungs 2 (two) times daily.   Tiotropium Bromide Monohydrate (SPIRIVA RESPIMAT) 2.5 MCG/ACT AERS, Inhale 2 puffs into the lungs daily.   Current Outpatient Medications (Analgesics):    acetaminophen (TYLENOL) 500 MG tablet, Take 1,000 mg by mouth every 6 (six) hours as needed for fever or mild pain.   ibuprofen (ADVIL) 400 MG tablet, Take 400 mg by mouth every 8 (eight) hours as needed for moderate pain.     Current Outpatient Medications (Other):    Accu-Chek Softclix Lancets lancets, Use as instructed   gabapentin (NEURONTIN) 300 MG capsule, Take 2 capsules (600 mg total) by mouth 3 (three) times daily. Please schedule office visit prior to any additional refills.   glucose blood (ACCU-CHEK GUIDE) test strip, Use to check blood sugar up to 4 times daily   loperamide (IMODIUM) 2 MG capsule, Take 1 capsule (2 mg total) by mouth daily.   sertraline (ZOLOFT) 100 MG tablet, TAKE 1 TABLET BY MOUTH EVERY DAY   Spacer/Aero-Holding Chambers (AEROCHAMBER MV) inhaler, Use as instructed   Facility-Administered Medications Ordered in Other Visits (Other):    sodium chloride flush (NS) 0.9 % injection 10 mL No current facility-administered medications for this visit.  Medical History:  Past Medical History:  Diagnosis Date   Anxiety    C. 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 In families  with MSH6 mutations, screening can start at age 76 years since the onset of colon cancer is later in these families.  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   Allergies:  Allergies  Allergen Reactions   Hydroxyzine Other (See Comments)    Made the patient feel not like himself     Surgical History:  He  has a past surgical history that includes Toe amputation (Left); EUS (N/A, 07/29/2016); XI robotic assisted lower anterior resection (N/A, 12/01/2016); Proctoscopy (N/A, 12/01/2016); Portacath placement (N/A, 01/26/2017); Pouchoscopy (N/A, 09/21/2017); Ileostomy closure (N/A, 09/22/2017); Pouchoscopy (N/A, 01/12/2018); Colon surgery; XI robotic assisted lower anterior resection (N/A, 05/10/2018); Lysis of adhesion (N/A, 05/10/2018); Rectal exam under anesthesia (N/A, 05/10/2018); IR REMOVAL TUN ACCESS W/ PORT W/O FL MOD SED (08/27/2021); and Scrotal exploration (N/A, 09/16/2022). Family History:  His family history includes Bone cancer (age of onset: 41) in his sister; Brain cancer in his maternal grandfather; COPD in his father; Colon cancer in his cousin and maternal aunt; Diabetes in his maternal grandmother and mother; Lung cancer in his paternal grandfather.  REVIEW OF SYSTEMS  : All other systems reviewed and negative except where noted in the History of Present Illness.  PHYSICAL EXAM: There were no vitals  taken for this visit. Physical Exam          Doree Albee, PA-C 8:37 AM

## 2024-03-12 ENCOUNTER — Ambulatory Visit: Payer: Medicaid Other | Admitting: Physician Assistant

## 2024-03-14 ENCOUNTER — Ambulatory Visit (INDEPENDENT_AMBULATORY_CARE_PROVIDER_SITE_OTHER): Admitting: Primary Care

## 2024-03-14 ENCOUNTER — Encounter (HOSPITAL_BASED_OUTPATIENT_CLINIC_OR_DEPARTMENT_OTHER): Payer: Self-pay | Admitting: Primary Care

## 2024-03-14 VITALS — BP 122/80 | HR 91 | Ht 71.0 in | Wt 157.8 lb

## 2024-03-14 DIAGNOSIS — R911 Solitary pulmonary nodule: Secondary | ICD-10-CM | POA: Diagnosis not present

## 2024-03-14 DIAGNOSIS — Z720 Tobacco use: Secondary | ICD-10-CM | POA: Diagnosis not present

## 2024-03-14 DIAGNOSIS — J432 Centrilobular emphysema: Secondary | ICD-10-CM | POA: Diagnosis not present

## 2024-03-14 NOTE — Progress Notes (Signed)
 @Patient  ID: [name], male    DOB: June 21, 1971, 53 y.o.   MRN: [id number]  No chief complaint on file.   Referring provider: Ivery Quale, MD  HPI: 53 year old male, current everyday smoker.  Past medical history significant for COPD/pulmonary emphysema, pulmonary nodules, rectal adenocarcinoma status post chemoradiation and colon resection and protctocolectomy in 2017, ileostomy, type 2 diabetes, hyperlipidemia, anxiety/depression, cervical radiculopathy, tobacco abuse. Patient of Dr. Everardo All, last seen on 02/17/23.   03/14/2024 Patient was last seen by Dr. Everardo All in February 2024.  Maintained on Advair 230-21 mcg 2 puffs twice daily along with Spiriva Respimat 2.5 mcg 2 puffs once daily and as needed albuterol.  Patient follows with lung cancer screening program for history of left lower lobe lung nodule greater than 1 cm which have resolved  Discussed the use of AI scribe software for clinical note transcription with the patient, who gave verbal consent to proceed.  History of Present Illness   [name] is a 53 year old male with COPD and emphysema who presents with increased use of rescue inhaler and worsening respiratory symptoms. He is accompanied by his partner of eighteen years.   Over the past few weeks, he has experienced increased use of his rescue inhaler and worsening respiratory symptoms. He uses his albuterol rescue inhaler five to six times a day, sometimes more, and has also been using a nebulizer for breathing treatments. His symptoms have worsened over the last six months, with increased coughing and daily production of clear or white mucus. No blood in the mucus is noted.  He is currently on Advair, two puffs morning and evening, and Spiriva Respimat, two puffs once a day in the morning. Despite this regimen, he reports suboptimal control of his symptoms. He has a history of smoking and is a current smoker, which contributes to his risk factors for lung issues.  His  last CT scan in February 2024 showed scattered pulmonary nodules bilaterally measuring up to five millimeters, with no new nodules. He has not been established with lung cancer screening program.  He has a history of rectal cancer and has a ileostomy bag. He reports weight loss, which is concerning given his history. He is no longer requiring follow-up with oncology. No changes in bowel movements or blood in his stool.  A pulmonary function test (PFT) from March 2023 showed severe obstructive lung disease with evidence of air trapping and reduced diffusion defect related to emphysema. There was a significant bronchodilator response after albuterol administration. No history of asthma as a child and has not had COVID-19.      Imaging: 02/16/23 CT chest wo contrast >> Stable scattered pulmonary nodules bilaterally measuring up to 5 mm. No new nodule is seen. Emphysema.   Allergies  Allergen Reactions   Hydroxyzine Other (See Comments)    Made the patient feel not like himself    Immunization History  Administered Date(s) Administered   Influenza,inj,Quad PF,6+ Mos 09/30/2016, 11/18/2017, 09/15/2018, 09/18/2020   PFIZER(Purple Top)SARS-COV-2 Vaccination 06/17/2020, 07/16/2020   Unspecified SARS-COV-2 Vaccination 06/13/2020    Past Medical History:  Diagnosis Date   Anxiety    C. 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 In families with MSH6 mutations, screening can start at age 57 years since the onset of colon cancer is later in these families.  Annual  screening for endome   Poor dental hygiene    Rectal adenocarcinoma s/p protctocolectomy, IPAA "J" pouch 12/01/2016 08/06/2016   Status post loop ileostomy takedown 09/22/2017 12/01/2016   Stricture of ileoanal anastomosis s/p dilitations 01/12/2018    Tobacco History: Social History   Tobacco Use  Smoking Status Every Day   Current packs/day: 1.00   Average packs/day: 1 pack/day for 38.2 years (38.2 ttl pk-yrs)   Types: Cigarettes   Start date: 12/20/1985  Smokeless Tobacco Never  Tobacco Comments   smokes 1 pack/day - 08/14/2020   Reports quitting for 1 year in the past   1/2 ppd as of 10/2023   Ready to quit: Not Answered Counseling given: Not Answered Tobacco comments: smokes 1 pack/day - 08/14/2020 Reports quitting for 1 year in the past 1/2 ppd as of 10/2023   Outpatient Medications Prior to Visit  Medication Sig Dispense Refill   Accu-Chek Softclix Lancets lancets Use as instructed 100 each 12   acetaminophen (TYLENOL) 500 MG tablet Take 1,000 mg by mouth every 6 (six) hours as needed for fever or mild pain.     albuterol (PROVENTIL) (2.5 MG/3ML) 0.083% nebulizer solution Take 3 mLs (2.5 mg total) by nebulization every 6 (six) hours as needed for wheezing or shortness of breath. 75 mL 0   albuterol (VENTOLIN HFA) 108 (90 Base) MCG/ACT inhaler Inhale 2 puffs into the lungs every 6 (six) hours as needed for wheezing or shortness of breath. 8 g 2   atorvastatin (LIPITOR) 20 MG tablet TAKE 1 TABLET BY MOUTH EVERY DAY (Patient not taking: Reported on 11/03/2023) 90 tablet 3   empagliflozin (JARDIANCE) 10 MG TABS tablet Take 1 tablet (10 mg total) by mouth daily. 90 tablet 0   fluticasone-salmeterol (ADVAIR HFA) 230-21 MCG/ACT inhaler Inhale 2 puffs into the lungs 2 (two) times daily. 1 each 12   gabapentin (NEURONTIN) 300 MG capsule Take 2 capsules (600 mg total) by mouth 3 (three) times daily. Please schedule office visit prior to any additional refills. 180 capsule 2   glucose  blood (ACCU-CHEK GUIDE) test strip Use to check blood sugar up to 4 times daily 100 strip 12   ibuprofen (ADVIL) 400 MG tablet Take 400 mg by mouth every 8 (eight) hours as needed for moderate pain.     loperamide (IMODIUM) 2 MG capsule Take 1 capsule (2 mg total) by mouth daily. 30 capsule 0   metFORMIN (GLUCOPHAGE) 500 MG tablet TAKE 1 TABLET BY MOUTH EVERY MORNING AND AT BEDTIME. PLEASE SCHEDULE OFFICE VISIT 60 tablet 0   sertraline (ZOLOFT) 100 MG tablet TAKE 1 TABLET BY MOUTH EVERY DAY 90 tablet 0   Spacer/Aero-Holding Chambers (AEROCHAMBER MV) inhaler Use as instructed 1 each 0   Tiotropium Bromide Monohydrate (SPIRIVA RESPIMAT) 2.5 MCG/ACT AERS Inhale 2 puffs into the lungs daily. 4 g 11   Facility-Administered Medications Prior to Visit  Medication Dose Route Frequency Provider Last Rate Last Admin   sodium chloride flush (NS) 0.9 % injection 10 mL  10 mL Intravenous PRN Malachy Mood, MD   10 mL at 11/18/17 1308   Review of Systems  Review of Systems  Constitutional:  Positive for unexpected weight change.  HENT:  Negative.    Respiratory:  Positive for cough and shortness of breath.   Cardiovascular: Negative.    Physical Exam  There were no vitals taken for this visit. Physical Exam Constitutional:      General: He is not in acute distress.    Appearance: Normal appearance. He is ill-appearing.     Comments: Cachectic appearance; chronically ill appearing   HENT:     Head: Normocephalic and atraumatic.  Cardiovascular:     Rate and Rhythm: Normal rate and regular rhythm.  Pulmonary:     Effort: Pulmonary effort is normal.     Breath sounds: Normal breath sounds. No wheezing, rhonchi or rales.     Comments: CTA; O2 96% RA Skin:    General: Skin is warm and dry.     Comments: Dusky/jaundice appearing skin   Neurological:     General: No focal deficit present.     Mental Status: He is alert and oriented to person, place, and time. Mental status is at baseline.   Psychiatric:        Mood and Affect: Mood normal.        Behavior: Behavior normal.        Thought Content: Thought content normal.        Judgment: Judgment normal.      Lab Results:  CBC    Component Value Date/Time   WBC 10.4 06/20/2022 0735   RBC 4.62 06/20/2022 0735   HGB 13.3 06/20/2022 0735   HGB 16.4 07/01/2021 1401   HGB 14.9 11/18/2017 0933   HCT 40.7 06/20/2022 0735   HCT 50.8 07/01/2021 1401   HCT 44.6 11/18/2017 0933   PLT 126 (L) 06/20/2022 0735   PLT 216 07/01/2021 1401   MCV 88.1 06/20/2022 0735   MCV 89 07/01/2021 1401   MCV 87.8 11/18/2017 0933   MCH 28.8 06/20/2022 0735   MCHC 32.7 06/20/2022 0735   RDW 14.8 06/20/2022 0735   RDW 13.5 07/01/2021 1401   RDW 14.3 11/18/2017 0933   LYMPHSABS 1.3 06/18/2022 2056   LYMPHSABS 1.3 07/01/2021 1401   LYMPHSABS 0.9 11/18/2017 0933   MONOABS 1.3 (H) 06/18/2022 2056   MONOABS 0.5 11/18/2017 0933   EOSABS 0.1 06/18/2022 2056   EOSABS 0.3 07/01/2021 1401   BASOSABS 0.1 06/18/2022 2056   BASOSABS 0.1 07/01/2021 1401   BASOSABS 0.1 11/18/2017 0933    BMET    Component Value Date/Time   NA 138 11/02/2023 1023   NA 139 11/18/2017 0933   K 4.3 11/02/2023 1023   K 3.6 11/18/2017 0933   CL 104 11/02/2023 1023   CO2 22 11/02/2023 1023   CO2 26 11/18/2017 0933   GLUCOSE 247 (H) 11/02/2023 1023   GLUCOSE 143 (H) 06/20/2022 0735   GLUCOSE 104 11/18/2017 0933   BUN 10 11/02/2023 1023   BUN 8.3 11/18/2017 0933   CREATININE 0.73 (L) 11/02/2023 1023   CREATININE 0.85 06/26/2020 1300   CREATININE 0.9 11/18/2017 0933   CALCIUM 8.2 (L) 11/02/2023 1023   CALCIUM 8.7 11/18/2017 0933   GFRNONAA >60 06/20/2022 0735   GFRNONAA >60 06/26/2020 1300   GFRAA 107 10/24/2020 0940   GFRAA >60 06/26/2020 1300    BNP    Component Value Date/Time   BNP 9.3 06/04/2020 1502    ProBNP No results found for: "PROBNP"  Imaging: No results found.   Assessment & Plan:   No problem-specific Assessment & Plan notes  found for this encounter.  1. Lung nodule (Primary) -  CT CHEST WO CONTRAST; Future  2. Centrilobular emphysema (HCC) - CT CHEST WO CONTRAST; Future  3. Tobacco abuse - CT CHEST WO CONTRAST; Future  Assessment and Plan    Chronic Obstructive Pulmonary Disease (COPD) with Emphysema Severe obstructive lung disease with air trapping and reduced diffusion capacity. Patient reports progressive dyspnea symptoms along with a chronic cough over the last several months. Increased rescue inhaler and nebulizer use indicates suboptimal control. PFT shows significant bronchodilator response, suggesting an asthmatic component. Current treatment includes Advair and Spiriva, but may benefit from a different maintenance inhaler  - Trial Breztri Aerosphere 2 puffs morning and evening for two weeks, replacing Advair and Spiriva. Monitor response and report back via MyTart message. - Continue using albuterol as needed, limit to every 4-6 hours to avoid side effects. - Schedule follow-up appointment in 3 months to reassess symptoms and treatment efficacy.  Pulmonary Nodules Hx scattered pulmonary nodules bilaterally, measuring up to 5 mm, with no new nodules observed on most recent imaging from February 2024  - Order chest CT without contrast to monitor pulmonary nodules   Rectal Cancer Concerns about potential lung cancer due to weight loss and smoking history. - Order chest CT without contrast to evaluate for potential malignancy.      40 mins spent on case; > 50% face to face with patient  Glenford Bayley, NP 03/14/2024

## 2024-03-14 NOTE — Patient Instructions (Addendum)
-  CHRONIC OBSTRUCTIVE PULMONARY DISEASE (COPD) WITH EMPHYSEMA: COPD with emphysema is a chronic lung condition that causes obstructed airflow and difficulty breathing. We will try a new inhaler Markus Daft) take two puffs morning and evening x two weeks to see if it helps improve your symptoms. Hold Advair and Spiriva while taking Breztri. Continue using your albuterol rescue inhaler as needed, but try to limit it to every 4-6 hours. We will reassess your symptoms in 3 months.  -PULMONARY NODULES: Pulmonary nodules are small growths in the lungs that need to be monitored, especially given your smoking history. We have ordered a chest CT scan to check for any changes in these nodules and will refer you to a lung cancer screening program for an annual CT scan.  -RECTAL CANCER: Given your history of rectal cancer and recent weight loss, we need to rule out any potential malignancy in your lungs. A chest CT scan has been ordered to evaluate this further.  INSTRUCTIONS: Please schedule a chest CT scan without contrast as soon as possible. We will also need to see you for a follow-up appointment in 3 months to reassess your symptoms and the effectiveness of the new inhaler. Additionally, you will be referred to a lung cancer screening program for an annual CT scan and a virtual visit to discuss the program details.  Orders: CT chest wo contrast   Follow-up June with Beth NP or sooner if needed

## 2024-03-27 ENCOUNTER — Ambulatory Visit (HOSPITAL_COMMUNITY)
Admission: RE | Admit: 2024-03-27 | Discharge: 2024-03-27 | Disposition: A | Discharge status: Home / Self Care | Source: Ambulatory Visit | Attending: Primary Care | Admitting: Primary Care

## 2024-03-27 DIAGNOSIS — J432 Centrilobular emphysema: Secondary | ICD-10-CM | POA: Insufficient documentation

## 2024-03-27 DIAGNOSIS — J439 Emphysema, unspecified: Secondary | ICD-10-CM | POA: Diagnosis not present

## 2024-03-27 DIAGNOSIS — R911 Solitary pulmonary nodule: Secondary | ICD-10-CM | POA: Diagnosis present

## 2024-03-27 DIAGNOSIS — Z720 Tobacco use: Secondary | ICD-10-CM | POA: Diagnosis present

## 2024-03-27 DIAGNOSIS — R918 Other nonspecific abnormal finding of lung field: Secondary | ICD-10-CM | POA: Diagnosis not present

## 2024-03-29 ENCOUNTER — Telehealth (HOSPITAL_BASED_OUTPATIENT_CLINIC_OR_DEPARTMENT_OTHER): Payer: Self-pay

## 2024-03-29 NOTE — Telephone Encounter (Signed)
 Will call patient once CT is reviewed     Copied from CRM [id number]. Topic: Clinical - Lab/Test Results >> Mar 29, 2024 12:30 PM Renie Ora wrote: Reason for CRM: Patient wife Rosanne Sack called regarding patient CT scan results. Rosanne Sack stated the patient had the CT done two days ago. Advised [name] no results available at this time. Rosanne Sack stated she would like to be called once results are avaiable, provided Rosanne Sack the timeframe for CT scan results.

## 2024-04-04 ENCOUNTER — Telehealth (HOSPITAL_BASED_OUTPATIENT_CLINIC_OR_DEPARTMENT_OTHER): Payer: Self-pay

## 2024-04-04 NOTE — Telephone Encounter (Signed)
 Will call patient once results are received and  signed off on by provider  Copied from CRM [id number]. Topic: Clinical - Lab/Test Results >> Apr 03, 2024  2:56 PM Claudis Cumber H wrote: Reason for CRM: Patient's significant other Clemetine Cypher) calling to see if CT results are in from the patient's CT scan on 04/08 [facility]) - Please call patient with results when avaliable - I advised them that it has been taking a few weeks to get some results from the Radiologists.

## 2024-04-05 ENCOUNTER — Other Ambulatory Visit (HOSPITAL_BASED_OUTPATIENT_CLINIC_OR_DEPARTMENT_OTHER): Payer: Self-pay | Admitting: Pulmonary Disease

## 2024-04-06 ENCOUNTER — Other Ambulatory Visit (HOSPITAL_BASED_OUTPATIENT_CLINIC_OR_DEPARTMENT_OTHER): Payer: Self-pay | Admitting: Pulmonary Disease

## 2024-04-06 NOTE — Telephone Encounter (Signed)
 patient girlfriend is calling to see have the results came in from the pet scan . [phone] to call back once results have been reviewed

## 2024-04-10 ENCOUNTER — Encounter (HOSPITAL_BASED_OUTPATIENT_CLINIC_OR_DEPARTMENT_OTHER): Payer: Self-pay

## 2024-04-10 ENCOUNTER — Telehealth (HOSPITAL_BASED_OUTPATIENT_CLINIC_OR_DEPARTMENT_OTHER): Payer: Self-pay

## 2024-04-10 DIAGNOSIS — Z720 Tobacco use: Secondary | ICD-10-CM

## 2024-04-10 DIAGNOSIS — R911 Solitary pulmonary nodule: Secondary | ICD-10-CM

## 2024-04-10 NOTE — Telephone Encounter (Signed)
 Copied from CRM [id number]. Topic: Clinical - Lab/Test Results >> Apr 10, 2024 10:08 AM Ambrose Junk wrote: Reason for CRM: Patient would like a call regarding CT results. Arnetta Lank it's been since April 8th and they have not received results.

## 2024-04-10 NOTE — Telephone Encounter (Signed)
 Pt notified we will call once we receive those results and results are not in. I did explain that their is a shortage of radiologists at this time.

## 2024-04-15 ENCOUNTER — Encounter: Payer: Self-pay | Admitting: Family Medicine

## 2024-04-16 ENCOUNTER — Ambulatory Visit (HOSPITAL_BASED_OUTPATIENT_CLINIC_OR_DEPARTMENT_OTHER): Admitting: Pulmonary Disease

## 2024-04-17 MED ORDER — SERTRALINE HCL 100 MG PO TABS: 100.0000 mg | ORAL_TABLET | Freq: Every day | ORAL | 1 refills | Status: DC

## 2024-04-19 DIAGNOSIS — Z932 Ileostomy status: Secondary | ICD-10-CM | POA: Diagnosis not present

## 2024-04-19 DIAGNOSIS — R911 Solitary pulmonary nodule: Secondary | ICD-10-CM

## 2024-04-19 HISTORY — DX: Solitary pulmonary nodule: R91.1

## 2024-04-20 ENCOUNTER — Telehealth (HOSPITAL_BASED_OUTPATIENT_CLINIC_OR_DEPARTMENT_OTHER): Payer: Self-pay

## 2024-04-20 NOTE — Telephone Encounter (Signed)
 Noted   Copied from CRM [id number]. Topic: Clinical - Lab/Test Results >> Apr 19, 2024  4:58 PM Tyronne Galloway wrote: Reason for CRM: Pt's spouse Aminta Kales called regarding ct scan results. I did not see results in yet, I did let her know that with the shortage on radiologists it could be 3-4 weeks before the results could be provided. Aminta Kales understood, and she stated it doesn't matter how they receive the results either through MyChart or by phone call.

## 2024-04-20 NOTE — Telephone Encounter (Signed)
 Spoke with patient. CT chest 03/27/24 showed new RLL lung nodule measuring 1.1 x 0.8cm. He is high risk for malignancy given smoking status and hx colon cancer. Recent weight loss. Ordering PET scan to evaluate.   Forward to Dr. Baldwin Levee, as Dr. Washington Hacker is out on maternity leave

## 2024-04-23 NOTE — Telephone Encounter (Signed)
 Needs f/u w RB, BW or SG once PET scan is done to review and decide next steps.

## 2024-04-26 ENCOUNTER — Telehealth: Payer: Self-pay | Admitting: *Deleted

## 2024-04-26 ENCOUNTER — Ambulatory Visit (HOSPITAL_COMMUNITY)
Admission: RE | Admit: 2024-04-26 | Discharge: 2024-04-26 | Disposition: A | Discharge status: Home / Self Care | Source: Ambulatory Visit | Attending: Primary Care | Admitting: Primary Care

## 2024-04-26 DIAGNOSIS — Z720 Tobacco use: Secondary | ICD-10-CM | POA: Diagnosis present

## 2024-04-26 DIAGNOSIS — R911 Solitary pulmonary nodule: Secondary | ICD-10-CM | POA: Insufficient documentation

## 2024-04-26 DIAGNOSIS — C20 Malignant neoplasm of rectum: Secondary | ICD-10-CM | POA: Diagnosis not present

## 2024-04-26 DIAGNOSIS — R918 Other nonspecific abnormal finding of lung field: Secondary | ICD-10-CM | POA: Diagnosis not present

## 2024-04-26 LAB — GLUCOSE, CAPILLARY: Glucose-Capillary: 208 mg/dL — ABNORMAL HIGH (ref 70–99)

## 2024-04-26 MED ORDER — FLUDEOXYGLUCOSE F - 18 (FDG) INJECTION
7.8000 | Freq: Once | INTRAVENOUS | Status: AC
  Administered 2024-04-26: 7.8 via INTRAVENOUS

## 2024-04-26 NOTE — Progress Notes (Signed)
 Yes please   Cc: [name]

## 2024-04-26 NOTE — Telephone Encounter (Signed)
 Review with patient/family, patient has an apt with Dara Ear on 5/14 to discuss bronchoscopy further

## 2024-04-26 NOTE — Telephone Encounter (Signed)
 Pt had a CT done in April 2025. Can you review these results? If the pt or pt's significant other is referring to the scan from today, they will have to wait about 2 weeks.

## 2024-04-26 NOTE — Telephone Encounter (Signed)
 PT 's sig other would like call back on CT results. Very worried and did not want to go thru the weekend without knowing status. Pls call @ [phone]

## 2024-04-26 NOTE — Progress Notes (Signed)
 Does Dr. Byrum or Dara Ear have a sooner apt than his one schedule with me in June to go over PET scan?

## 2024-04-30 NOTE — Telephone Encounter (Signed)
Patient has appointment scheduled for tomorrow.

## 2024-04-30 NOTE — Telephone Encounter (Signed)
 Disregard last note, patient has appointment scheduled for this Wednesday with SG.

## 2024-05-02 ENCOUNTER — Other Ambulatory Visit: Payer: Self-pay

## 2024-05-02 ENCOUNTER — Ambulatory Visit: Admitting: Acute Care

## 2024-05-02 ENCOUNTER — Encounter (HOSPITAL_COMMUNITY): Payer: Self-pay | Admitting: Emergency Medicine

## 2024-05-02 ENCOUNTER — Encounter: Payer: Self-pay | Admitting: Acute Care

## 2024-05-02 VITALS — BP 102/70 | HR 82 | Ht 71.0 in | Wt 156.4 lb

## 2024-05-02 DIAGNOSIS — Z85048 Personal history of other malignant neoplasm of rectum, rectosigmoid junction, and anus: Secondary | ICD-10-CM | POA: Diagnosis not present

## 2024-05-02 DIAGNOSIS — R911 Solitary pulmonary nodule: Secondary | ICD-10-CM | POA: Diagnosis not present

## 2024-05-02 DIAGNOSIS — F17210 Nicotine dependence, cigarettes, uncomplicated: Secondary | ICD-10-CM

## 2024-05-02 DIAGNOSIS — F17200 Nicotine dependence, unspecified, uncomplicated: Secondary | ICD-10-CM

## 2024-05-02 NOTE — H&P (View-Only) (Signed)
 History of Present Illness [name] is a 53 y.o. male current every day smoker referred 04/2024 for evaluation of a growing pulmonary lung nodule in the setting of a history of rectal cancer. He will be followed by Dr. Baldwin Levee.  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  Pt. Has consented to use of [employer] soft wear to help capture the content of this OV.  05/02/2024 [name] is a 53 year old male with a history of rectal cancer who presents for evaluation of a growing right lower lobe lung nodule. He was referred by Dr. Washington Hacker for evaluation of a growing lung nodule.  He has a history of rectal cancer and underwent a chest CT in February 2024, which revealed a small left lung nodule measuring 5 mm and a right lower lobe nodule measuring 4 mm. A follow-up CT one year later showed growth of the right lower lobe nodule to 11 mm, prompting further evaluation.  Pt. States he is in his usual state of health. No weight loss or hemoptysis.   A PET scan was performed to better evaluate this finding, revealing mild hypermetabolism in the right lower lobe nodule. There is no evidence of other disease on the PET scan.  He smokes a pack of cigarettes per day, which is a risk factor for lung cancer. No sleep apnea or latex allergy. He has a history of diabetes, for which he is not currently taking medication.  We explained that the next best step in his plan of care was for a navigational bronchoscopy with biopsies. Pt. And his significant other are in agreement with the plan. We have discussed risks and benefits of the procedure, including  bleeding, infection, puncture of the lung, and adverse reaction to anesthesia.You have agreed to move forward with the procedure.   You will receive a letter today that will provide very specific instructions regarding your procedure.   Please let us  know if you have any  additional questions or concerns.   Test Results: PET Scan 04/26/2024 New mildly metabolic RIGHT lower lobe nodule. Activity similar to background blood pool activity. Activity in the scan is somewhat decreased due to excessive muscle uptake. Lesion remains suspicious for malignancy (potential rectal carcinoma metastasis). Recommend tissue sampling. Centrilobular emphysema in the upper lobes. No evidence of metastatic disease.  CT chest 03/27/2024 Interval development of a solid lobulated 1.1 x 0.8 cm right lower lobe pulmonary nodule. Consider one of the following in 3 months for both low-risk and high-risk individuals: (a) repeat chest CT, (b) follow-up PET-CT, or (c) tissue sampling. This recommendation follows the consensus statement: Guidelines for Management of Incidental Pulmonary Nodules Detected on CT Images: From the Fleischner Society 2017; Radiology 2017; 284:228-243. 2. Cholelithiasis with no acute cholecystitis. 3.  Emphysema (ICD10-J43.9).  02/17/2023 CT Chest Lungs/Pleura: Moderate to severe centrilobular emphysematous changes are noted in the lungs. No consolidation, effusion, or pneumothorax. Multiple scattered pulmonary nodules bilaterally. On the left, the largest measures 5 mm in the left upper lobe, axial image 43. A 4 mm subpleural nodule is noted in the right lower lobe, axial image 54. 4 mm pulmonary nodule is noted in the left upper lobe, axial image 40, unchanged additional smaller pulmonary nodules are present bilaterally. No new nodule is seen.  Stable scattered pulmonary nodules bilaterally measuring up to 5 mm. No new nodule is seen. 2. Emphysema. 3. Hepatic steatosis.  Latest Ref Rng & Units 06/20/2022    7:35 AM 06/19/2022    2:05 PM 06/18/2022    8:56 PM  CBC  WBC 4.0 - 10.5 K/uL 10.4  13.0  18.8   Hemoglobin 13.0 - 17.0 g/dL 52.8  41.3  24.4   Hematocrit 39.0 - 52.0 % 40.7  43.3  47.4   Platelets 150 - 400 K/uL 126  124  154         Latest Ref Rng & Units 11/02/2023   10:23 AM 06/20/2022    7:35 AM 06/19/2022    2:05 PM  BMP  Glucose 70 - 99 mg/dL 010  272  536   BUN 6 - 24 mg/dL 10  8  6    Creatinine 0.76 - 1.27 mg/dL 6.44  0.34  7.42   BUN/Creat Ratio 9 - 20 14     Sodium 134 - 144 mmol/L 138  137  140   Potassium 3.5 - 5.2 mmol/L 4.3  3.6  3.8   Chloride 96 - 106 mmol/L 104  107  109   CO2 20 - 29 mmol/L 22  25  26    Calcium  8.7 - 10.2 mg/dL 8.2  7.6  8.0     BNP    Component Value Date/Time   BNP 9.3 06/04/2020 1502    ProBNP No results found for: "PROBNP"  PFT    Component Value Date/Time   FEV1PRE 1.30 02/26/2022 0843   FEV1POST 1.86 02/26/2022 0843   FVCPRE 3.17 02/26/2022 0843   FVCPOST 4.45 02/26/2022 0843   TLC 9.11 02/26/2022 0843   DLCOUNC 16.07 02/26/2022 0843   PREFEV1FVCRT 41 02/26/2022 0843   PSTFEV1FVCRT 42 02/26/2022 0843    NM PET Image Initial (PI) Skull Base To Thigh Result Date: 04/26/2024 CLINICAL DATA:  Subsequent treatment strategy for lung nodule. EXAM: NUCLEAR MEDICINE PET SKULL BASE TO THIGH TECHNIQUE: 7.7 mCi F-18 FDG was injected intravenously. Full-ring PET imaging was performed from the skull base to thigh after the radiotracer. CT data was obtained and used for attenuation correction and anatomic localization. Fasting blood glucose: 208 mg/dl COMPARISON:  CT 59/56/3875, 02/16/2023 FINDINGS: NECK: No hypermetabolic lymph nodes in the neck. Incidental CT findings: None. CHEST: 11 mm nodule in the RIGHT lower lobe has minimal metabolic activity SUV max equal 1.0. This is similar to background blood pool activity (SUV max equal 1.3). The nodule is new from CT 02/08/2023. Small nodule in the LEFT upper lobe measures 3 mm. This appears partially calcified on comparison CT. Extensive centrilobular emphysema the upper lobes. Incidental CT findings: None. ABDOMEN/PELVIS: No abnormal hypermetabolic activity within the liver, pancreas, adrenal glands, or spleen. No hypermetabolic lymph  nodes in the abdomen or pelvis. Incidental CT findings: RIGHT abdominal wall ostomy. Post colectomy. SKELETON: There is extensive muscular uptake throughout the entire body. Patient reports some exercising prior to scan. Incidental CT findings: None. IMPRESSION: 1. New mildly metabolic RIGHT lower lobe nodule. Activity similar to background blood pool activity. Activity in the scan is somewhat decreased due to excessive muscle uptake. Lesion remains suspicious for malignancy (potential rectal carcinoma metastasis). Recommend tissue sampling. 2. Centrilobular emphysema in the upper lobes. 3. No evidence of metastatic disease. Electronically Signed   By: Deboraha Fallow M.D.   On: 04/26/2024 08:55     Past medical hx Past Medical History:  Diagnosis Date   Anxiety    C. 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 In families with MSH6 mutations, screening can start at age 1 years since the onset of colon cancer is later in these families.  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 He started smoking about 38 years ago. He has a 38.4 pack-year smoking history. He has never used smokeless tobacco. He reports that he does not drink alcohol and does not use drugs.  Tobacco Cessation: Ready to quit: Not Answered Counseling given: Not Answered Tobacco comments: smokes 1 pack/day - 08/14/2020 Reports quitting for 1 year in the past 1/2 ppd as of 10/2023 Current every day smoker. Smoking 1 PPD per patient and wife today 05/02/2024.  Past surgical hx, Family hx, Social hx all reviewed.  Current Outpatient Medications on File Prior to Visit  Medication Sig   Accu-Chek Softclix Lancets lancets Use as instructed   acetaminophen  (TYLENOL ) 500 MG tablet Take 1,000 mg by mouth every 6 (six) hours as needed for fever or mild pain.   albuterol  (PROVENTIL ) (2.5 MG/3ML) 0.083% nebulizer solution Take 3 mLs (2.5 mg total) by nebulization every 6 (six) hours as needed for wheezing or shortness of breath.   atorvastatin  (LIPITOR) 20 MG tablet TAKE 1 TABLET BY MOUTH EVERY DAY   empagliflozin  (JARDIANCE ) 10 MG TABS tablet Take 1 tablet (10 mg total) by mouth daily.   fluticasone -salmeterol (ADVAIR HFA) 230-21 MCG/ACT inhaler Inhale 2 puffs into the lungs 2 (two) times daily.   gabapentin  (NEURONTIN ) 300 MG capsule Take 2 capsules (600 mg total) by mouth 3 (three) times daily. Please schedule office visit prior to any additional refills.   glucose blood (ACCU-CHEK GUIDE) test strip Use to check blood sugar up to 4 times daily   ibuprofen  (ADVIL ) 400 MG tablet Take 400 mg by mouth every 8 (eight) hours as needed for moderate pain.   loperamide  (IMODIUM ) 2 MG capsule Take 1 capsule (2 mg total) by mouth daily.   metFORMIN  (GLUCOPHAGE )  500 MG tablet TAKE 1 TABLET BY MOUTH EVERY MORNING AND AT BEDTIME. PLEASE SCHEDULE OFFICE VISIT   sertraline  (ZOLOFT ) 100 MG tablet Take 1 tablet (100 mg total) by mouth daily.   Spacer/Aero-Holding Chambers (AEROCHAMBER PLUS FLO-VU LARGE) MISC USE AS INSTRUCTED   Tiotropium Bromide  Monohydrate (SPIRIVA  RESPIMAT)  2.5 MCG/ACT AERS Inhale 2 puffs into the lungs daily.   VENTOLIN  HFA 108 (90 Base) MCG/ACT inhaler TAKE 2 PUFFS BY MOUTH EVERY 6 HOURS AS NEEDED FOR WHEEZE OR SHORTNESS OF BREATH   Current Facility-Administered Medications on File Prior to Visit  Medication   sodium chloride  flush (NS) 0.9 % injection 10 mL     Allergies  Allergen Reactions   Hydroxyzine  Other (See Comments)    Made the patient feel not like himself    Review Of Systems:  Constitutional:   No  weight loss, night sweats,  Fevers, chills, fatigue, or  lassitude.  HEENT:   No headaches,  Difficulty swallowing,  Tooth/dental problems, or  Sore throat,                No sneezing, itching, ear ache, nasal congestion, post nasal drip,   CV:  No chest pain,  Orthopnea, PND, swelling in lower extremities, anasarca, dizziness, palpitations, syncope.   GI  No heartburn, indigestion, abdominal pain, nausea, vomiting, diarrhea, change in bowel habits, loss of appetite, bloody stools.   Resp: No shortness of breath with exertion or at rest.  No excess mucus, no productive cough,  No non-productive cough,  No coughing up of blood.  No change in color of mucus.  No wheezing.  No chest wall deformity  Skin: no rash or lesions.  GU: no dysuria, change in color of urine, no urgency or frequency.  No flank pain, no hematuria   MS:  No joint pain or swelling.  No decreased range of motion.  No back pain.  Psych:  No change in mood or affect. No depression or anxiety.  No memory loss.   Vital Signs BP 102/70 (BP Location: Left Arm, Patient Position: Sitting, Cuff Size: Normal)   Pulse 82   Ht 5\' 11"  (1.803 m)   Wt 156  lb 6.4 oz (70.9 kg)   SpO2 98%   BMI 21.81 kg/m    Physical Exam:  General- No distress,  A&Ox3, p[leasant  ENT: No sinus tenderness, TM clear, pale nasal mucosa, no oral exudate,no post nasal drip, no LAN Cardiac: S1, S2, regular rate and rhythm, no murmur Chest: No wheeze/ rales/ dullness; no accessory muscle use, no nasal flaring, no sternal retractions Abd.: Soft Non-tender, ND, BS +, Body mass index is 21.81 kg/m.  Ext: No clubbing cyanosis, edema, no obvious deformities Neuro:  normal strength, MAE x 4, A&O x 3, appropriate Skin: No rashes, warm and dry, no obvious  skin lesions  Psych: normal mood and behavior   Assessment/Plan Enlarging pulmonary nodule Current every day smoker  History of rectal cancer  Plan The PET scan dose show some increased activity in the right lower lobe nodule. With your history of rectal cancer, we will schedule you for a biopsy top ensure this is not metastatic rectal cancer, or a new lung cancer.  I have placed an order for a bronchoscopy with biopsies.  We have discussed the procedure in detail.  We have reviewed the risks and benefits of the procedure. These include bleeding, infection, puncture of the lung, and adverse reaction to anesthesia. You have agreed to proceed with biopsy to evaluate the left upper lobe nodule. Your procedure will be done by Dr. Racheal Buddle. You will receive a letter today with date time and information pertaining to the procedure. You will need someone to drive you to the procedure, stay with you during the procedure, and stay with you after the procedure.  You will also need someone to stay with you for 24 hours after anesthesia to ensure you have cleared and are doing well. You will follow-up with me 1 week after the procedure to review the results and to ensure you are doing well. Call if you need us  prior to the procedure or if you have any questions at all. Please contact office for sooner follow up if  symptoms do not improve or worsen or seek emergency care.  Please work on quitting smoking.       You can receive free nicotine  replacement therapy ( patches, gum or mints) by calling 1-800-QUIT NOW. Please call so we can get you on the path to becoming  a non-smoker. I know it is hard, but you can do this!  Other options for assistance in smoking cessation ( As covered by your insurance benefits)  Hypnosis for smoking cessation  [employer] [phone]  Acupuncture for smoking cessation  [facility] [phone]     I will see you after the biopsy and we will review your results. Please contact office for sooner follow up if symptoms do not improve or worsen or seek emergency care    I spent 35  minutes dedicated to the care of this patient on the date of this encounter to include pre-visit review of records, face-to-face time with the patient discussing conditions above, post visit ordering of testing, clinical documentation with the electronic health record, making appropriate referrals as documented, and communicating necessary information to the patient's healthcare team.     Raejean Bullock, NP 05/02/2024  9:02 AM

## 2024-05-02 NOTE — Patient Instructions (Signed)
 It is good to see you today. The PET scan dose show some increased activity in the right lower lobe nodule. With your history of rectal cancer, we will schedule you for a biopsy top ensure this is not metastatic rectal cancer, or a new lung cancer.  I have placed an order for a bronchoscopy with biopsies.  We have discussed the procedure in detail.  We have reviewed the risks and benefits of the procedure. These include bleeding, infection, puncture of the lung, and adverse reaction to anesthesia. You have agreed to proceed with biopsy to evaluate the left upper lobe nodule. Your procedure will be done by Dr. Racheal Buddle. You will receive a letter today with date time and information pertaining to the procedure. You will need someone to drive you to the procedure, stay with you during the procedure, and stay with you after the procedure. You will also need someone to stay with you for 24 hours after anesthesia to ensure you have cleared and are doing well. You will follow-up with me 1 week after the procedure to review the results and to ensure you are doing well. Call if you need us  prior to the procedure or if you have any questions at all. Please contact office for sooner follow up if symptoms do not improve or worsen or seek emergency care.  Please work on quitting smoking.       You can receive free nicotine  replacement therapy ( patches, gum or mints) by calling 1-800-QUIT NOW. Please call so we can get you on the path to becoming  a non-smoker. I know it is hard, but you can do this!  Other options for assistance in smoking cessation ( As covered by your insurance benefits)  Hypnosis for smoking cessation  [employer] [phone]  Acupuncture for smoking cessation  [facility] [phone]     I will see you after the biopsy and we will review your results. Please contact office for sooner follow up if symptoms do not improve or worsen or seek emergency  care

## 2024-05-02 NOTE — Progress Notes (Signed)
 PCP - [facility], Dr Carey Chapman Cardiologist - none Pulmonology - Dara Ear, NP  CT Chest x-ray - 03/27/24 EKG - n/a Stress Test - n/a ECHO - n/a Cardiac Cath - n/a  ICD Pacemaker/Loop - n/a  Sleep Study -  n/a  Diabetes Type 2, occasional checks CBG Hold Jardiance  for 72 hours prior to procedure.  Last dose will be on Thursday, 05/03/24.  Do not take Metformin  on the morning of surgery.  If your blood sugar is less than 70 mg/dL, you will need to treat for low blood sugar: Treat a low blood sugar (less than 70 mg/dL) with  cup of clear juice (cranberry or apple), 4 glucose tablets, OR glucose gel. Recheck blood sugar in 15 minutes after treatment (to make sure it is greater than 70 mg/dL). If your blood sugar is not greater than 70 mg/dL on recheck, call [phone] for further instructions.  Aspirin & Blood Thinner Instructions:  n/a  NPO  Anesthesia review: no  STOP now taking any Aspirin (unless otherwise instructed by your surgeon), Aleve, Naproxen, Ibuprofen , Motrin , Advil , Goody's, BC's, all herbal medications, fish oil, and all vitamins.   Coronavirus Screening Does the patient have any of the following symptoms:  Cough Yes Fever (>100.6F)  yes/no: No Runny nose yes/no: No Sore throat yes/no: No Difficulty breathing/shortness of breath  Yes  Has the patient traveled in the last 14 days and where? yes/no: No  Patient verbalized understanding of instructions that were given via phone.

## 2024-05-02 NOTE — Progress Notes (Addendum)
 History of Present Illness [name] is a 53 y.o. male current every day smoker referred 04/2024 for evaluation of a growing pulmonary lung nodule in the setting of a history of rectal cancer. He will be followed by Dr. Baldwin Levee.  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  Pt. Has consented to use of [employer] soft wear to help capture the content of this OV.  05/02/2024 [name] is a 53 year old male with a history of rectal cancer who presents for evaluation of a growing right lower lobe lung nodule. He was referred by Dr. Washington Hacker for evaluation of a growing lung nodule.  He has a history of rectal cancer and underwent a chest CT in February 2024, which revealed a small left lung nodule measuring 5 mm and a right lower lobe nodule measuring 4 mm. A follow-up CT one year later showed growth of the right lower lobe nodule to 11 mm, prompting further evaluation.  Pt. States he is in his usual state of health. No weight loss or hemoptysis.   A PET scan was performed to better evaluate this finding, revealing mild hypermetabolism in the right lower lobe nodule. There is no evidence of other disease on the PET scan.  He smokes a pack of cigarettes per day, which is a risk factor for lung cancer. No sleep apnea or latex allergy. He has a history of diabetes, for which he is not currently taking medication.  We explained that the next best step in his plan of care was for a navigational bronchoscopy with biopsies. Pt. And his significant other are in agreement with the plan. We have discussed risks and benefits of the procedure, including  bleeding, infection, puncture of the lung, and adverse reaction to anesthesia.You have agreed to move forward with the procedure.   You will receive a letter today that will provide very specific instructions regarding your procedure.   Please let us  know if you have any  additional questions or concerns.   Test Results: PET Scan 04/26/2024 New mildly metabolic RIGHT lower lobe nodule. Activity similar to background blood pool activity. Activity in the scan is somewhat decreased due to excessive muscle uptake. Lesion remains suspicious for malignancy (potential rectal carcinoma metastasis). Recommend tissue sampling. Centrilobular emphysema in the upper lobes. No evidence of metastatic disease.  CT chest 03/27/2024 Interval development of a solid lobulated 1.1 x 0.8 cm right lower lobe pulmonary nodule. Consider one of the following in 3 months for both low-risk and high-risk individuals: (a) repeat chest CT, (b) follow-up PET-CT, or (c) tissue sampling. This recommendation follows the consensus statement: Guidelines for Management of Incidental Pulmonary Nodules Detected on CT Images: From the Fleischner Society 2017; Radiology 2017; 284:228-243. 2. Cholelithiasis with no acute cholecystitis. 3.  Emphysema (ICD10-J43.9).  02/17/2023 CT Chest Lungs/Pleura: Moderate to severe centrilobular emphysematous changes are noted in the lungs. No consolidation, effusion, or pneumothorax. Multiple scattered pulmonary nodules bilaterally. On the left, the largest measures 5 mm in the left upper lobe, axial image 43. A 4 mm subpleural nodule is noted in the right lower lobe, axial image 54. 4 mm pulmonary nodule is noted in the left upper lobe, axial image 40, unchanged additional smaller pulmonary nodules are present bilaterally. No new nodule is seen.  Stable scattered pulmonary nodules bilaterally measuring up to 5 mm. No new nodule is seen. 2. Emphysema. 3. Hepatic steatosis.  Latest Ref Rng & Units 06/20/2022    7:35 AM 06/19/2022    2:05 PM 06/18/2022    8:56 PM  CBC  WBC 4.0 - 10.5 K/uL 10.4  13.0  18.8   Hemoglobin 13.0 - 17.0 g/dL 52.8  41.3  24.4   Hematocrit 39.0 - 52.0 % 40.7  43.3  47.4   Platelets 150 - 400 K/uL 126  124  154         Latest Ref Rng & Units 11/02/2023   10:23 AM 06/20/2022    7:35 AM 06/19/2022    2:05 PM  BMP  Glucose 70 - 99 mg/dL 010  272  536   BUN 6 - 24 mg/dL 10  8  6    Creatinine 0.76 - 1.27 mg/dL 6.44  0.34  7.42   BUN/Creat Ratio 9 - 20 14     Sodium 134 - 144 mmol/L 138  137  140   Potassium 3.5 - 5.2 mmol/L 4.3  3.6  3.8   Chloride 96 - 106 mmol/L 104  107  109   CO2 20 - 29 mmol/L 22  25  26    Calcium  8.7 - 10.2 mg/dL 8.2  7.6  8.0     BNP    Component Value Date/Time   BNP 9.3 06/04/2020 1502    ProBNP No results found for: "PROBNP"  PFT    Component Value Date/Time   FEV1PRE 1.30 02/26/2022 0843   FEV1POST 1.86 02/26/2022 0843   FVCPRE 3.17 02/26/2022 0843   FVCPOST 4.45 02/26/2022 0843   TLC 9.11 02/26/2022 0843   DLCOUNC 16.07 02/26/2022 0843   PREFEV1FVCRT 41 02/26/2022 0843   PSTFEV1FVCRT 42 02/26/2022 0843    NM PET Image Initial (PI) Skull Base To Thigh Result Date: 04/26/2024 CLINICAL DATA:  Subsequent treatment strategy for lung nodule. EXAM: NUCLEAR MEDICINE PET SKULL BASE TO THIGH TECHNIQUE: 7.7 mCi F-18 FDG was injected intravenously. Full-ring PET imaging was performed from the skull base to thigh after the radiotracer. CT data was obtained and used for attenuation correction and anatomic localization. Fasting blood glucose: 208 mg/dl COMPARISON:  CT 59/56/3875, 02/16/2023 FINDINGS: NECK: No hypermetabolic lymph nodes in the neck. Incidental CT findings: None. CHEST: 11 mm nodule in the RIGHT lower lobe has minimal metabolic activity SUV max equal 1.0. This is similar to background blood pool activity (SUV max equal 1.3). The nodule is new from CT 02/08/2023. Small nodule in the LEFT upper lobe measures 3 mm. This appears partially calcified on comparison CT. Extensive centrilobular emphysema the upper lobes. Incidental CT findings: None. ABDOMEN/PELVIS: No abnormal hypermetabolic activity within the liver, pancreas, adrenal glands, or spleen. No hypermetabolic lymph  nodes in the abdomen or pelvis. Incidental CT findings: RIGHT abdominal wall ostomy. Post colectomy. SKELETON: There is extensive muscular uptake throughout the entire body. Patient reports some exercising prior to scan. Incidental CT findings: None. IMPRESSION: 1. New mildly metabolic RIGHT lower lobe nodule. Activity similar to background blood pool activity. Activity in the scan is somewhat decreased due to excessive muscle uptake. Lesion remains suspicious for malignancy (potential rectal carcinoma metastasis). Recommend tissue sampling. 2. Centrilobular emphysema in the upper lobes. 3. No evidence of metastatic disease. Electronically Signed   By: Deboraha Fallow M.D.   On: 04/26/2024 08:55     Past medical hx Past Medical History:  Diagnosis Date   Anxiety    C. 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 In families with MSH6 mutations, screening can start at age 1 years since the onset of colon cancer is later in these families.  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 He started smoking about 38 years ago. He has a 38.4 pack-year smoking history. He has never used smokeless tobacco. He reports that he does not drink alcohol and does not use drugs.  Tobacco Cessation: Ready to quit: Not Answered Counseling given: Not Answered Tobacco comments: smokes 1 pack/day - 08/14/2020 Reports quitting for 1 year in the past 1/2 ppd as of 10/2023 Current every day smoker. Smoking 1 PPD per patient and wife today 05/02/2024.  Past surgical hx, Family hx, Social hx all reviewed.  Current Outpatient Medications on File Prior to Visit  Medication Sig   Accu-Chek Softclix Lancets lancets Use as instructed   acetaminophen  (TYLENOL ) 500 MG tablet Take 1,000 mg by mouth every 6 (six) hours as needed for fever or mild pain.   albuterol  (PROVENTIL ) (2.5 MG/3ML) 0.083% nebulizer solution Take 3 mLs (2.5 mg total) by nebulization every 6 (six) hours as needed for wheezing or shortness of breath.   atorvastatin  (LIPITOR) 20 MG tablet TAKE 1 TABLET BY MOUTH EVERY DAY   empagliflozin  (JARDIANCE ) 10 MG TABS tablet Take 1 tablet (10 mg total) by mouth daily.   fluticasone -salmeterol (ADVAIR HFA) 230-21 MCG/ACT inhaler Inhale 2 puffs into the lungs 2 (two) times daily.   gabapentin  (NEURONTIN ) 300 MG capsule Take 2 capsules (600 mg total) by mouth 3 (three) times daily. Please schedule office visit prior to any additional refills.   glucose blood (ACCU-CHEK GUIDE) test strip Use to check blood sugar up to 4 times daily   ibuprofen  (ADVIL ) 400 MG tablet Take 400 mg by mouth every 8 (eight) hours as needed for moderate pain.   loperamide  (IMODIUM ) 2 MG capsule Take 1 capsule (2 mg total) by mouth daily.   metFORMIN  (GLUCOPHAGE )  500 MG tablet TAKE 1 TABLET BY MOUTH EVERY MORNING AND AT BEDTIME. PLEASE SCHEDULE OFFICE VISIT   sertraline  (ZOLOFT ) 100 MG tablet Take 1 tablet (100 mg total) by mouth daily.   Spacer/Aero-Holding Chambers (AEROCHAMBER PLUS FLO-VU LARGE) MISC USE AS INSTRUCTED   Tiotropium Bromide  Monohydrate (SPIRIVA  RESPIMAT)  2.5 MCG/ACT AERS Inhale 2 puffs into the lungs daily.   VENTOLIN  HFA 108 (90 Base) MCG/ACT inhaler TAKE 2 PUFFS BY MOUTH EVERY 6 HOURS AS NEEDED FOR WHEEZE OR SHORTNESS OF BREATH   Current Facility-Administered Medications on File Prior to Visit  Medication   sodium chloride  flush (NS) 0.9 % injection 10 mL     Allergies  Allergen Reactions   Hydroxyzine  Other (See Comments)    Made the patient feel not like himself    Review Of Systems:  Constitutional:   No  weight loss, night sweats,  Fevers, chills, fatigue, or  lassitude.  HEENT:   No headaches,  Difficulty swallowing,  Tooth/dental problems, or  Sore throat,                No sneezing, itching, ear ache, nasal congestion, post nasal drip,   CV:  No chest pain,  Orthopnea, PND, swelling in lower extremities, anasarca, dizziness, palpitations, syncope.   GI  No heartburn, indigestion, abdominal pain, nausea, vomiting, diarrhea, change in bowel habits, loss of appetite, bloody stools.   Resp: No shortness of breath with exertion or at rest.  No excess mucus, no productive cough,  No non-productive cough,  No coughing up of blood.  No change in color of mucus.  No wheezing.  No chest wall deformity  Skin: no rash or lesions.  GU: no dysuria, change in color of urine, no urgency or frequency.  No flank pain, no hematuria   MS:  No joint pain or swelling.  No decreased range of motion.  No back pain.  Psych:  No change in mood or affect. No depression or anxiety.  No memory loss.   Vital Signs BP 102/70 (BP Location: Left Arm, Patient Position: Sitting, Cuff Size: Normal)   Pulse 82   Ht 5\' 11"  (1.803 m)   Wt 156  lb 6.4 oz (70.9 kg)   SpO2 98%   BMI 21.81 kg/m    Physical Exam:  General- No distress,  A&Ox3, p[leasant  ENT: No sinus tenderness, TM clear, pale nasal mucosa, no oral exudate,no post nasal drip, no LAN Cardiac: S1, S2, regular rate and rhythm, no murmur Chest: No wheeze/ rales/ dullness; no accessory muscle use, no nasal flaring, no sternal retractions Abd.: Soft Non-tender, ND, BS +, Body mass index is 21.81 kg/m.  Ext: No clubbing cyanosis, edema, no obvious deformities Neuro:  normal strength, MAE x 4, A&O x 3, appropriate Skin: No rashes, warm and dry, no obvious  skin lesions  Psych: normal mood and behavior   Assessment/Plan Enlarging pulmonary nodule Current every day smoker  History of rectal cancer  Plan The PET scan dose show some increased activity in the right lower lobe nodule. With your history of rectal cancer, we will schedule you for a biopsy top ensure this is not metastatic rectal cancer, or a new lung cancer.  I have placed an order for a bronchoscopy with biopsies.  We have discussed the procedure in detail.  We have reviewed the risks and benefits of the procedure. These include bleeding, infection, puncture of the lung, and adverse reaction to anesthesia. You have agreed to proceed with biopsy to evaluate the left upper lobe nodule. Your procedure will be done by Dr. Racheal Buddle. You will receive a letter today with date time and information pertaining to the procedure. You will need someone to drive you to the procedure, stay with you during the procedure, and stay with you after the procedure.  You will also need someone to stay with you for 24 hours after anesthesia to ensure you have cleared and are doing well. You will follow-up with me 1 week after the procedure to review the results and to ensure you are doing well. Call if you need us  prior to the procedure or if you have any questions at all. Please contact office for sooner follow up if  symptoms do not improve or worsen or seek emergency care.  Please work on quitting smoking.       You can receive free nicotine  replacement therapy ( patches, gum or mints) by calling 1-800-QUIT NOW. Please call so we can get you on the path to becoming  a non-smoker. I know it is hard, but you can do this!  Other options for assistance in smoking cessation ( As covered by your insurance benefits)  Hypnosis for smoking cessation  [employer] [phone]  Acupuncture for smoking cessation  [facility] [phone]     I will see you after the biopsy and we will review your results. Please contact office for sooner follow up if symptoms do not improve or worsen or seek emergency care    I spent 35  minutes dedicated to the care of this patient on the date of this encounter to include pre-visit review of records, face-to-face time with the patient discussing conditions above, post visit ordering of testing, clinical documentation with the electronic health record, making appropriate referrals as documented, and communicating necessary information to the patient's healthcare team.     Raejean Bullock, NP 05/02/2024  9:02 AM

## 2024-05-07 ENCOUNTER — Encounter (HOSPITAL_COMMUNITY): Payer: Self-pay | Admitting: Emergency Medicine

## 2024-05-07 ENCOUNTER — Ambulatory Visit (HOSPITAL_COMMUNITY)
Admission: RE | Admit: 2024-05-07 | Discharge: 2024-05-07 | Disposition: A | Discharge status: Home / Self Care | Attending: Emergency Medicine | Admitting: Emergency Medicine

## 2024-05-07 ENCOUNTER — Ambulatory Visit (HOSPITAL_BASED_OUTPATIENT_CLINIC_OR_DEPARTMENT_OTHER): Admitting: Anesthesiology

## 2024-05-07 ENCOUNTER — Ambulatory Visit (HOSPITAL_COMMUNITY)

## 2024-05-07 ENCOUNTER — Other Ambulatory Visit: Payer: Self-pay

## 2024-05-07 ENCOUNTER — Encounter (HOSPITAL_COMMUNITY)
Admission: RE | Disposition: A | Discharge status: Home / Self Care | Payer: Self-pay | Source: Home / Self Care | Attending: Emergency Medicine

## 2024-05-07 ENCOUNTER — Ambulatory Visit (HOSPITAL_COMMUNITY): Admitting: Anesthesiology

## 2024-05-07 DIAGNOSIS — R911 Solitary pulmonary nodule: Secondary | ICD-10-CM | POA: Insufficient documentation

## 2024-05-07 DIAGNOSIS — Z85048 Personal history of other malignant neoplasm of rectum, rectosigmoid junction, and anus: Secondary | ICD-10-CM | POA: Diagnosis not present

## 2024-05-07 DIAGNOSIS — F418 Other specified anxiety disorders: Secondary | ICD-10-CM | POA: Diagnosis not present

## 2024-05-07 DIAGNOSIS — J432 Centrilobular emphysema: Secondary | ICD-10-CM | POA: Insufficient documentation

## 2024-05-07 DIAGNOSIS — J449 Chronic obstructive pulmonary disease, unspecified: Secondary | ICD-10-CM

## 2024-05-07 DIAGNOSIS — F17210 Nicotine dependence, cigarettes, uncomplicated: Secondary | ICD-10-CM | POA: Insufficient documentation

## 2024-05-07 DIAGNOSIS — Z1509 Genetic susceptibility to other malignant neoplasm: Secondary | ICD-10-CM | POA: Insufficient documentation

## 2024-05-07 DIAGNOSIS — E119 Type 2 diabetes mellitus without complications: Secondary | ICD-10-CM | POA: Diagnosis not present

## 2024-05-07 DIAGNOSIS — R918 Other nonspecific abnormal finding of lung field: Secondary | ICD-10-CM

## 2024-05-07 HISTORY — DX: Type 2 diabetes mellitus without complications: E11.9

## 2024-05-07 HISTORY — PX: BRONCHIAL BIOPSY: SHX5109

## 2024-05-07 HISTORY — PX: BRONCHIAL BRUSHINGS: SHX5108

## 2024-05-07 HISTORY — PX: VIDEO BRONCHOSCOPY WITH ENDOBRONCHIAL NAVIGATION: SHX6175

## 2024-05-07 HISTORY — DX: Dyspnea, unspecified: R06.00

## 2024-05-07 LAB — GLUCOSE, CAPILLARY
Glucose-Capillary: 162 mg/dL — ABNORMAL HIGH (ref 70–99)
Glucose-Capillary: 144 mg/dL — ABNORMAL HIGH (ref 70–99)
Glucose-Capillary: 123 mg/dL — ABNORMAL HIGH (ref 70–99)

## 2024-05-07 LAB — CBC
HCT: 49.8 % (ref 39.0–52.0)
Hemoglobin: 16.7 g/dL (ref 13.0–17.0)
MCH: 29.6 pg (ref 26.0–34.0)
MCHC: 33.5 g/dL (ref 30.0–36.0)
MCV: 88.3 fL (ref 80.0–100.0)
Platelets: 224 10*3/uL (ref 150–400)
RBC: 5.64 MIL/uL (ref 4.22–5.81)
RDW: 13.9 % (ref 11.5–15.5)
WBC: 8.7 10*3/uL (ref 4.0–10.5)
nRBC: 0 % (ref 0.0–0.2)

## 2024-05-07 LAB — BASIC METABOLIC PANEL WITH GFR
Anion gap: 8 (ref 5–15)
BUN: 5 mg/dL — ABNORMAL LOW (ref 6–20)
CO2: 21 mmol/L — ABNORMAL LOW (ref 22–32)
Calcium: 8.1 mg/dL — ABNORMAL LOW (ref 8.9–10.3)
Chloride: 108 mmol/L (ref 98–111)
Creatinine, Ser: 0.99 mg/dL (ref 0.61–1.24)
GFR, Estimated: >60 mL/min (ref >60)
Glucose, Bld: 160 mg/dL — ABNORMAL HIGH (ref 70–99)
Potassium: 4.5 mmol/L (ref 3.5–5.1)
Sodium: 137 mmol/L (ref 135–145)

## 2024-05-07 SURGERY — VIDEO BRONCHOSCOPY WITH ENDOBRONCHIAL NAVIGATION
Anesthesia: General | Laterality: Right

## 2024-05-07 MED ORDER — SUGAMMADEX SODIUM 200 MG/2ML IV SOLN
INTRAVENOUS | Status: DC | PRN
  Administered 2024-05-07: 200 mg via INTRAVENOUS

## 2024-05-07 MED ORDER — MIDAZOLAM HCL 2 MG/2ML IJ SOLN: 0.5000 mg | Freq: Once | INTRAMUSCULAR | Status: DC | PRN

## 2024-05-07 MED ORDER — FENTANYL CITRATE (PF) 100 MCG/2ML IJ SOLN
INTRAMUSCULAR | Status: AC
  Filled 2024-05-07: qty 2

## 2024-05-07 MED ORDER — ACETAMINOPHEN 500 MG PO TABS
ORAL_TABLET | ORAL | Status: AC
  Administered 2024-05-07: 1000 mg via ORAL
  Filled 2024-05-07: qty 2

## 2024-05-07 MED ORDER — MIDAZOLAM HCL 2 MG/2ML IJ SOLN
INTRAMUSCULAR | Status: AC
Start: 2024-05-07 — End: 2024-05-07
  Filled 2024-05-07: qty 2

## 2024-05-07 MED ORDER — FENTANYL CITRATE (PF) 250 MCG/5ML IJ SOLN
INTRAMUSCULAR | Status: DC | PRN
  Administered 2024-05-07: 100 ug via INTRAVENOUS

## 2024-05-07 MED ORDER — FENTANYL CITRATE (PF) 100 MCG/2ML IJ SOLN: 25.0000 ug | INTRAMUSCULAR | Status: DC | PRN

## 2024-05-07 MED ORDER — ACETAMINOPHEN 500 MG PO TABS: 1000.0000 mg | ORAL_TABLET | Freq: Once | ORAL | Status: AC

## 2024-05-07 MED ORDER — OXYCODONE HCL 5 MG PO TABS: 5.0000 mg | ORAL_TABLET | Freq: Once | ORAL | Status: DC | PRN

## 2024-05-07 MED ORDER — DEXAMETHASONE SODIUM PHOSPHATE 10 MG/ML IJ SOLN
INTRAMUSCULAR | Status: DC | PRN
  Administered 2024-05-07: 8 mg via INTRAVENOUS

## 2024-05-07 MED ORDER — LACTATED RINGERS IV SOLN: INTRAVENOUS | Status: DC

## 2024-05-07 MED ORDER — CHLORHEXIDINE GLUCONATE 0.12 % MT SOLN: 15.0000 mL | Freq: Once | OROMUCOSAL | Status: AC

## 2024-05-07 MED ORDER — PROPOFOL 10 MG/ML IV BOLUS
INTRAVENOUS | Status: DC | PRN
  Administered 2024-05-07: 130 mg via INTRAVENOUS

## 2024-05-07 MED ORDER — MIDAZOLAM HCL 2 MG/2ML IJ SOLN
INTRAMUSCULAR | Status: DC | PRN
  Administered 2024-05-07: 2 mg via INTRAVENOUS

## 2024-05-07 MED ORDER — PHENYLEPHRINE HCL-NACL 20-0.9 MG/250ML-% IV SOLN
INTRAVENOUS | Status: DC | PRN
  Administered 2024-05-07: 25 ug/min via INTRAVENOUS

## 2024-05-07 MED ORDER — MEPERIDINE HCL 25 MG/ML IJ SOLN: 6.2500 mg | INTRAMUSCULAR | Status: DC | PRN

## 2024-05-07 MED ORDER — CHLORHEXIDINE GLUCONATE 0.12 % MT SOLN
OROMUCOSAL | Status: AC
  Administered 2024-05-07: 15 mL via OROMUCOSAL
  Filled 2024-05-07: qty 15

## 2024-05-07 MED ORDER — ROCURONIUM BROMIDE 10 MG/ML (PF) SYRINGE
PREFILLED_SYRINGE | INTRAVENOUS | Status: DC | PRN
  Administered 2024-05-07: 60 mg via INTRAVENOUS

## 2024-05-07 MED ORDER — ONDANSETRON HCL 4 MG/2ML IJ SOLN
INTRAMUSCULAR | Status: DC | PRN
  Administered 2024-05-07: 4 mg via INTRAVENOUS

## 2024-05-07 MED ORDER — OXYCODONE HCL 5 MG/5ML PO SOLN: 5.0000 mg | Freq: Once | ORAL | Status: DC | PRN

## 2024-05-07 MED ORDER — PROPOFOL 500 MG/50ML IV EMUL
INTRAVENOUS | Status: DC | PRN
Start: 2024-05-07 — End: 2024-05-07
  Administered 2024-05-07: 200 ug/kg/min via INTRAVENOUS

## 2024-05-07 MED ORDER — LIDOCAINE 2% (20 MG/ML) 5 ML SYRINGE
INTRAMUSCULAR | Status: DC | PRN
  Administered 2024-05-07: 20 mg via INTRAVENOUS

## 2024-05-07 NOTE — Op Note (Signed)
 Video Bronchoscopy with Robotic Assisted Bronchoscopic Navigation   Date of Operation: 05/07/2024   Pre-op Diagnosis: Right lower lobe pulmonary nodule  Post-op Diagnosis: Same  Surgeon: Racheal Buddle  Assistants: None  Anesthesia: General endotracheal anesthesia  Operation: Flexible video fiberoptic bronchoscopy with robotic assistance and biopsies.  Estimated Blood Loss: Minimal  Complications: None  Indications and History: [name] is a 53 y.o. male with history of tobacco use and colon cancer.  He was found to have a slowly enlarging right lower lobe pulmonary nodule on serial CT scans of the chest.  Recommendation made to achieve a tissue diagnosis via robotic assisted navigational bronchoscopy.  The risks, benefits, complications, treatment options and expected outcomes were discussed with the patient.  The possibilities of pneumothorax, pneumonia, reaction to medication, pulmonary aspiration, perforation of a viscus, bleeding, failure to diagnose a condition and creating a complication requiring transfusion or operation were discussed with the patient who freely signed the consent.    Description of Procedure: The patient was seen in the Preoperative Area, was examined and was deemed appropriate to proceed.  The patient was taken to [facility] Endoscopy room 3, identified as [name] and the procedure verified as Flexible Video Fiberoptic Bronchoscopy.  A Time Out was held and the above information confirmed.   Prior to the date of the procedure a high-resolution CT scan of the chest was performed. Utilizing ION software program a virtual tracheobronchial tree was generated to allow the creation of distinct navigation pathways to the patient's parenchymal abnormalities. After being taken to the operating room general anesthesia was initiated and the patient  was orally intubated. The video fiberoptic bronchoscope was introduced via the endotracheal tube and a general inspection was  performed which showed overall normal right and left lung anatomy.  There was some slight thickening and irregularity of segmental airway carinae in the right lower lobe.  Endobronchial forceps biopsies were performed at these areas and sent for cytology.  Aspiration of the bilateral mainstems was completed to remove any remaining secretions. Robotic catheter inserted into patient's endotracheal tube.   Target #1 right lower lobe pulmonary nodule: The distinct navigation pathways prepared prior to this procedure were then utilized to navigate to patient's lesion identified on CT scan. The robotic catheter was secured into place and the vision probe was withdrawn.  Lesion location was approximated using fluoroscopy.  Local registration and targeting was performed using [employer] Cios mobile C-arm three-dimensional imaging. Under fluoroscopic guidance transbronchial needle brushings, transbronchial needle biopsies, and transbronchial forceps biopsies were performed to be sent for cytology and pathology.  Needle-in-lesion was confirmed using Cios mobile C-arm.  A bronchioalveolar lavage was performed in the right lower lobe and sent for cytology.   At the end of the procedure a general airway inspection was performed and there was no evidence of active bleeding. The bronchoscope was removed.  The patient tolerated the procedure well. There was no significant blood loss and there were no obvious complications. A post-procedural chest x-ray is pending.  Samples Target #1: 1. Transbronchial needle brushings from right lower lobe nodule 2. Transbronchial Wang needle biopsies from right lower lobe nodule 3. Transbronchial forceps biopsies from right lower lobe nodule 4. Bronchoalveolar lavage from right lower lobe lobe   Plans:  The patient will be discharged from the PACU to home when recovered from anesthesia and after chest x-ray is reviewed. We will review the cytology, pathology and  microbiology results with the patient when they become available. Outpatient  followup will be with Dr. Baldwin Levee and Braden Caddy, NP.    Racheal Buddle, MD, PhD 05/07/2024, 4:24 PM [facility] [phone] or if no answer before 7:00PM call [phone] For any issues after 7:00PM please call eLink [phone]

## 2024-05-07 NOTE — Anesthesia Preprocedure Evaluation (Addendum)
 Anesthesia Evaluation  Patient identified by MRN, date of birth, ID band Patient awake    Reviewed: Allergy & Precautions, NPO status , Patient's Chart, lab work & pertinent test results  History of Anesthesia Complications Negative for: history of anesthetic complications  Airway Mallampati: II  TM Distance: >3 FB Neck ROM: Full    Dental  (+) Poor Dentition, Missing, Chipped, Dental Advisory Given   Pulmonary COPD,  COPD inhaler, Current SmokerPatient did not abstain from smoking.   breath sounds clear to auscultation       Cardiovascular negative cardio ROS  Rhythm:Regular Rate:Normal     Neuro/Psych   Anxiety Depression    negative neurological ROS     GI/Hepatic Neg liver ROS,,,Colon cancer   Endo/Other  diabetes (glu 162, diet controlled)    Renal/GU negative Renal ROS     Musculoskeletal   Abdominal   Peds  Hematology negative hematology ROS (+)   Anesthesia Other Findings   Reproductive/Obstetrics                             Anesthesia Physical Anesthesia Plan  ASA: 3  Anesthesia Plan: General   Post-op Pain Management: Tylenol  PO (pre-op)*   Induction: Intravenous  PONV Risk Score and Plan: 1 and Ondansetron  and Dexamethasone   Airway Management Planned: Oral ETT  Additional Equipment: None  Intra-op Plan:   Post-operative Plan: Extubation in OR  Informed Consent: I have reviewed the patients History and Physical, chart, labs and discussed the procedure including the risks, benefits and alternatives for the proposed anesthesia with the patient or authorized representative who has indicated his/her understanding and acceptance.     Dental advisory given  Plan Discussed with: CRNA and Surgeon  Anesthesia Plan Comments:        Anesthesia Quick Evaluation

## 2024-05-07 NOTE — Discharge Instructions (Addendum)

## 2024-05-07 NOTE — Progress Notes (Signed)
 CBG 162; patient with DM type 2 - no meds at home. No need for periop glycemic control protocol per Dr. Cleora Daft. Will continue to monitor

## 2024-05-07 NOTE — Anesthesia Procedure Notes (Signed)
 Procedure Name: Intubation Date/Time: 05/07/2024 3:17 PM  Performed by: Candance Certain, CRNAPre-anesthesia Checklist: Patient identified, Emergency Drugs available, Suction available and Patient being monitored Patient Re-evaluated:Patient Re-evaluated prior to induction Oxygen Delivery Method: Circle System Utilized Preoxygenation: Pre-oxygenation with 100% oxygen Induction Type: IV induction Ventilation: Mask ventilation without difficulty Laryngoscope Size: Mac and 4 Grade View: Grade I Tube type: Oral Tube size: 8.5 mm Number of attempts: 1 Airway Equipment and Method: Stylet and Oral airway Placement Confirmation: ETT inserted through vocal cords under direct vision, positive ETCO2 and breath sounds checked- equal and bilateral Secured at: 23 cm Tube secured with: Tape Dental Injury: Teeth and Oropharynx as per pre-operative assessment  Comments: Patient with very poor dentition. One remaining tooth on bottom that is chipped. Atraumatic induction/intubation. Dentition and oral mucosa as per preop.

## 2024-05-07 NOTE — Transfer of Care (Signed)
 Immediate Anesthesia Transfer of Care Note  Patient: [name]  Procedure(s) Performed: VIDEO BRONCHOSCOPY WITH ENDOBRONCHIAL NAVIGATION (Right) BRONCHOSCOPY, WITH BIOPSY BRONCHOSCOPY, WITH BRUSH BIOPSY  Patient Location: PACU  Anesthesia Type:General  Level of Consciousness: awake and alert   Airway & Oxygen Therapy: Patient Spontanous Breathing and Patient connected to face mask oxygen  Post-op Assessment: Report given to RN and Post -op Vital signs reviewed and stable  Post vital signs: Reviewed and stable  Last Vitals:  Vitals Value Taken Time  BP 111/76 05/07/24 1635  Temp    Pulse 100 05/07/24 1639  Resp 26 05/07/24 1639  SpO2 95 % 05/07/24 1639  Vitals shown include unfiled device data.  Last Pain:  Vitals:   05/07/24 1312  TempSrc:   PainSc: 0-No pain         Complications: No notable events documented.

## 2024-05-07 NOTE — Interval H&P Note (Signed)
 History and Physical Interval Note:  05/07/2024 3:00 PM  [name]  has presented today for surgery, with the diagnosis of lung nodule RLL.  The various methods of treatment have been discussed with the patient and family. After consideration of risks, benefits and other options for treatment, the patient has consented to  Procedure(s): VIDEO BRONCHOSCOPY WITH ENDOBRONCHIAL NAVIGATION (Right) as a surgical intervention.  The patient's history has been reviewed, patient examined, no change in status, stable for surgery.  I have reviewed the patient's chart and labs.  Questions were answered to the patient's satisfaction.     Denson Flake

## 2024-05-08 ENCOUNTER — Encounter (HOSPITAL_COMMUNITY): Payer: Self-pay | Admitting: Emergency Medicine

## 2024-05-08 ENCOUNTER — Telehealth: Payer: Self-pay | Admitting: Emergency Medicine

## 2024-05-08 NOTE — Anesthesia Postprocedure Evaluation (Signed)
 Anesthesia Post Note  Patient: [name]  Procedure(s) Performed: VIDEO BRONCHOSCOPY WITH ENDOBRONCHIAL NAVIGATION (Right) BRONCHOSCOPY, WITH BIOPSY BRONCHOSCOPY, WITH BRUSH BIOPSY     Patient location during evaluation: PACU Anesthesia Type: General Level of consciousness: awake and alert Pain management: pain level controlled Vital Signs Assessment: post-procedure vital signs reviewed and stable Respiratory status: spontaneous breathing, nonlabored ventilation, respiratory function stable and patient connected to nasal cannula oxygen Cardiovascular status: blood pressure returned to baseline and stable Postop Assessment: no apparent nausea or vomiting Anesthetic complications: no   No notable events documented.  Last Vitals:  Vitals:   05/07/24 1700 05/07/24 1706  BP: 108/74 112/76  Pulse: 94 98  Resp: (!) 21 14  Temp:  36.9 C  SpO2: 92% 95%    Last Pain:  Vitals:   05/07/24 1706  TempSrc:   PainSc: 0-No pain                 Ananda Caya

## 2024-05-08 NOTE — Telephone Encounter (Signed)
 Copied from CRM [id number]. Topic: Appointments - Scheduling Inquiry for Clinic >> May 07, 2024  3:17 PM Isabell A wrote: Reason for CRM: [name] (spouse) [phone] is requesting a sooner appointment then 5/28 with Dara Ear.    Patient had a bronch with Dr. Baldwin Levee on 5/19. Patient's OV with Dara Ear NP on 5/28 is to review path results from the biopsy performed at the bronch. I advised pt's wife, [name] (on [place]), that we have to stay within 7-10 days for bronch follow up to assure the path results are back in time for review at OV, any sooner and the results may not be back yet. [name] verbalized understanding and would like to keep original appt on 5/28 with SG. Nothing further needed at this time.

## 2024-05-09 LAB — CYTOLOGY - NON PAP

## 2024-05-16 ENCOUNTER — Ambulatory Visit: Admitting: Acute Care

## 2024-05-16 ENCOUNTER — Encounter: Payer: Self-pay | Admitting: Acute Care

## 2024-05-16 VITALS — BP 109/72 | HR 69 | Ht 71.0 in | Wt 157.2 lb

## 2024-05-16 DIAGNOSIS — Z9889 Other specified postprocedural states: Secondary | ICD-10-CM | POA: Diagnosis not present

## 2024-05-16 DIAGNOSIS — F17210 Nicotine dependence, cigarettes, uncomplicated: Secondary | ICD-10-CM

## 2024-05-16 DIAGNOSIS — Z85048 Personal history of other malignant neoplasm of rectum, rectosigmoid junction, and anus: Secondary | ICD-10-CM

## 2024-05-16 DIAGNOSIS — R911 Solitary pulmonary nodule: Secondary | ICD-10-CM

## 2024-05-16 DIAGNOSIS — F17200 Nicotine dependence, unspecified, uncomplicated: Secondary | ICD-10-CM

## 2024-05-16 NOTE — Patient Instructions (Addendum)
 It is good to see you today. I am glad you have done well after the biopsy. Your  biopsy was negative for lung cancer. I am still concerned about this nodule, so we will do a 3 month follow up scan due after August 20th, 2025.  You will follow up with me after this scan to review results. You will get a call to get this scheduled, and for follow up with Isa Manuel NP. Please work on quitting smoking. You can receive free nicotine  replacement therapy ( patches, gum or mints) by calling 1-800-QUIT NOW. Please call so we can get you on the path to becoming  a non-smoker. I know it is hard, but you can do this!  Other options for assistance in smoking cessation ( As covered by your insurance benefits)  Hypnosis for smoking cessation  [employer] [phone]  Acupuncture for smoking cessation  [employer] [phone]    Follow up in 3 months after the scan.  Call to be seen sooner if you continue to have blood in your sputum after the procedure, or you have unexplained weight loss.  Please contact office for sooner follow up if symptoms do not improve or worsen or seek emergency care

## 2024-05-16 NOTE — Progress Notes (Signed)
 History of Present Illness [name] is a 53 y.o. male current every day smoker referred 04/2024 for evaluation of a growing pulmonary lung nodule in the setting of a history of rectal cancer. He will be followed by Dr. Baldwin Levee.   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   Synopsis [name] is a 53 year old male with a history of rectal cancer who presents for evaluation of a growing right lower lobe lung nodule. He was referred by Dr. Washington Hacker for evaluation of a growing lung nodule.   He has a history of rectal cancer and underwent a chest CT in February 2024, which revealed a small left lung nodule measuring 5 mm and a right lower lobe nodule measuring 4 mm. A follow-up CT one year later showed growth of the right lower lobe nodule to 11 mm, prompting further evaluation.   Pt. States he is in his usual state of health. No weight loss or hemoptysis.    A PET scan was performed to better evaluate this finding, revealing mild hypermetabolism in the right lower lobe nodule. There is no evidence of other disease on the PET scan.   He smokes a pack of cigarettes per day.  Pt. Underwent a bronchoscopy with biopsies on 05/07/2024.  He is here today to review results of cytology and to ensure he has done well postprocedure.    05/16/2024 Pt. Presents for  follow up bronchoscopy with biopsies. Pt. Underwent the procedure 05/07/2024. Patient states he is done well after the procedure.  No bleeding, hemoptysis, fever, discolored secretions, worsening dyspnea, or adverse reaction to anesthesia.  We have reviewed the results of his biopsies.  They were negative for malignant cells.  Per cytology there were rare foci of possible organizing pneumonia.  Patient is asymptomatic.  Based on recent growth plan will be for close monitoring of this right lower lobe pulmonary nodule.  Plan will be for a 46-month  follow-up CT chest to ensure stability and no further growth.  We did discuss with the patient if there were further growth we would need to reevaluate.  He verbalized understanding.  We had a long discussion about his continued smoking.  We discussed that it was absolutely necessary that he quit smoking to ensure decreased risk of developing lung cancer.  While he verbalized understanding he states he finds it very very difficult to quit.  We discussed the use of nicotine  patches gum or mints.  I have asked him to let me know if he would like to try Chantix.   Test Results: FINAL MICROSCOPIC DIAGNOSIS:  A. LUNG, RLL NODULE, FINE NEEDLE ASPIRATION AND BIOPSIES:  - No malignant cells identified. See comment.   B. LUNG, RLL NODULE, BRUSHING:  - No malignant cells identified   D. LUNG, RLL CARINA, BIOPSY:  - No malignant cells identified   C. LUNG, RLL, LAVAGE:    FINAL MICROSCOPIC DIAGNOSIS:  - No malignant cells identified   PET Scan 04/26/2024 New mildly metabolic RIGHT lower lobe nodule. Activity similar to background blood pool activity. Activity in the scan is somewhat decreased due to excessive muscle uptake. Lesion remains suspicious for malignancy (potential rectal carcinoma metastasis). Recommend tissue sampling. Centrilobular emphysema in the upper lobes. No evidence of metastatic disease.   CT chest 03/27/2024 Interval development of a solid lobulated 1.1 x 0.8 cm right lower lobe pulmonary nodule. Consider one of the following in  3 months for both low-risk and high-risk individuals: (a) repeat chest CT, (b) follow-up PET-CT, or (c) tissue sampling. This recommendation follows the consensus statement: Guidelines for Management of Incidental Pulmonary Nodules Detected on CT Images: From the Fleischner Society 2017; Radiology 2017; 284:228-243. 2. Cholelithiasis with no acute cholecystitis. 3.  Emphysema (ICD10-J43.9).   02/17/2023 CT Chest Lungs/Pleura: Moderate to  severe centrilobular emphysematous changes are noted in the lungs. No consolidation, effusion, or pneumothorax. Multiple scattered pulmonary nodules bilaterally. On the left, the largest measures 5 mm in the left upper lobe, axial image 43. A 4 mm subpleural nodule is noted in the right lower lobe, axial image 54. 4 mm pulmonary nodule is noted in the left upper lobe, axial image 40, unchanged additional smaller pulmonary nodules are present bilaterally. No new nodule is seen.  Stable scattered pulmonary nodules bilaterally measuring up to 5 mm. No new nodule is seen. 2. Emphysema. 3. Hepatic steatosis.       Latest Ref Rng & Units 05/07/2024    1:31 PM 06/20/2022    7:35 AM 06/19/2022    2:05 PM  CBC  WBC 4.0 - 10.5 K/uL 8.7  10.4  13.0   Hemoglobin 13.0 - 17.0 g/dL 04.5  40.9  81.1   Hematocrit 39.0 - 52.0 % 49.8  40.7  43.3   Platelets 150 - 400 K/uL 224  126  124        Latest Ref Rng & Units 05/07/2024    1:31 PM 11/02/2023   10:23 AM 06/20/2022    7:35 AM  BMP  Glucose 70 - 99 mg/dL 914  782  956   BUN 6 - 20 mg/dL 5  10  8    Creatinine 0.61 - 1.24 mg/dL 2.13  0.86  5.78   BUN/Creat Ratio 9 - 20  14    Sodium 135 - 145 mmol/L 137  138  137   Potassium 3.5 - 5.1 mmol/L 4.5  4.3  3.6   Chloride 98 - 111 mmol/L 108  104  107   CO2 22 - 32 mmol/L 21  22  25    Calcium  8.9 - 10.3 mg/dL 8.1  8.2  7.6     BNP    Component Value Date/Time   BNP 9.3 06/04/2020 1502    ProBNP No results found for: "PROBNP"  PFT    Component Value Date/Time   FEV1PRE 1.30 02/26/2022 0843   FEV1POST 1.86 02/26/2022 0843   FVCPRE 3.17 02/26/2022 0843   FVCPOST 4.45 02/26/2022 0843   TLC 9.11 02/26/2022 0843   DLCOUNC 16.07 02/26/2022 0843   PREFEV1FVCRT 41 02/26/2022 0843   PSTFEV1FVCRT 42 02/26/2022 0843    DG Chest Port 1 View Result Date: 05/07/2024 CLINICAL DATA:  Bronchoscopy with biopsy EXAM: PORTABLE CHEST 1 VIEW COMPARISON:  None Available. FINDINGS: No pneumothorax.  Interstitial prominence most notable in the right lower lung zone. Heart mediastinum are not significantly changed. IMPRESSION: 1. No pneumothorax following biopsy. Electronically Signed   By: Reagan Camera M.D.   On: 05/07/2024 18:00   DG C-ARM BRONCHOSCOPY Result Date: 05/07/2024 C-ARM BRONCHOSCOPY: Fluoroscopy was utilized by the requesting physician.  No radiographic interpretation.   DG C-Arm 1-60 Min-No Report Result Date: 05/07/2024 Fluoroscopy was utilized by the requesting physician.  No radiographic interpretation.   NM PET Image Initial (PI) Skull Base To Thigh Result Date: 04/26/2024 CLINICAL DATA:  Subsequent treatment strategy for lung nodule. EXAM: NUCLEAR MEDICINE PET SKULL BASE TO THIGH TECHNIQUE: 7.7 mCi F-18  FDG was injected intravenously. Full-ring PET imaging was performed from the skull base to thigh after the radiotracer. CT data was obtained and used for attenuation correction and anatomic localization. Fasting blood glucose: 208 mg/dl COMPARISON:  CT 78/29/5621, 02/16/2023 FINDINGS: NECK: No hypermetabolic lymph nodes in the neck. Incidental CT findings: None. CHEST: 11 mm nodule in the RIGHT lower lobe has minimal metabolic activity SUV max equal 1.0. This is similar to background blood pool activity (SUV max equal 1.3). The nodule is new from CT 02/08/2023. Small nodule in the LEFT upper lobe measures 3 mm. This appears partially calcified on comparison CT. Extensive centrilobular emphysema the upper lobes. Incidental CT findings: None. ABDOMEN/PELVIS: No abnormal hypermetabolic activity within the liver, pancreas, adrenal glands, or spleen. No hypermetabolic lymph nodes in the abdomen or pelvis. Incidental CT findings: RIGHT abdominal wall ostomy. Post colectomy. SKELETON: There is extensive muscular uptake throughout the entire body. Patient reports some exercising prior to scan. Incidental CT findings: None. IMPRESSION: 1. New mildly metabolic RIGHT lower lobe nodule. Activity  similar to background blood pool activity. Activity in the scan is somewhat decreased due to excessive muscle uptake. Lesion remains suspicious for malignancy (potential rectal carcinoma metastasis). Recommend tissue sampling. 2. Centrilobular emphysema in the upper lobes. 3. No evidence of metastatic disease. Electronically Signed   By: Deboraha Fallow M.D.   On: 04/26/2024 08:55     Past medical hx Past Medical History:  Diagnosis Date   Anxiety    C. 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 In families with MSH6 mutations, screening can start at age 9 years since the onset of colon cancer is later in these families.  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 km  Vaping Use   Vaping status: Never Used   Substance Use Topics   Alcohol use: No   Drug use: No    Comment: patient denies    Mr.Brodbeck reports that he has been smoking cigarettes. He started smoking about 38 years ago. He has a 38.4 pack-year smoking history. He has never used smokeless tobacco. He reports that he does not drink alcohol and  does not use drugs.  Tobacco Cessation: Ready to quit: Not Answered Counseling given: Not Answered Tobacco comments: , smokes 1 pack/day - 08/14/2020 Reports quitting for 1 year in the past 1/2 ppd as of 10/2023 Patient's wife states patient smokes 1 ppd as of 05/02/24. km Current everyday smoker currently smoking 1 pack/day Patient was counseled extensively x 3 to 5 minutes to quit smoking resources for smoking cessation have been provided  Past surgical hx, Family hx, Social hx all reviewed.  Current Outpatient Medications on File Prior to Visit  Medication Sig   Accu-Chek Softclix Lancets lancets Use as instructed   acetaminophen  (TYLENOL ) 500 MG tablet Take 1,000 mg by mouth every 6 (six) hours as needed for fever or mild pain.   albuterol  (PROVENTIL ) (2.5 MG/3ML) 0.083% nebulizer solution Take 3 mLs (2.5 mg total) by nebulization every 6 (six) hours as needed for wheezing or shortness of breath.   glucose blood (ACCU-CHEK GUIDE) test strip Use to check blood sugar up to 4 times daily   sertraline  (ZOLOFT ) 100 MG tablet Take 1 tablet (100 mg total) by mouth daily.   Spacer/Aero-Holding Chambers (AEROCHAMBER PLUS FLO-VU LARGE) MISC USE AS INSTRUCTED   VENTOLIN  HFA 108 (90 Base) MCG/ACT inhaler TAKE 2 PUFFS BY MOUTH EVERY 6 HOURS AS NEEDED FOR WHEEZE OR SHORTNESS OF BREATH   Current Facility-Administered Medications on File Prior to Visit  Medication   sodium chloride  flush (NS) 0.9 % injection 10 mL     Allergies  Allergen Reactions   Hydroxyzine  Other (See Comments)    Made the patient feel not like himself    Review Of Systems:  Constitutional:   No  weight loss, night  sweats,  Fevers, chills, fatigue, or  lassitude.  HEENT:   No headaches,  Difficulty swallowing,  Tooth/dental problems, or  Sore throat,                No sneezing, itching, ear ache, nasal congestion, post nasal drip,   CV:  No chest pain,  Orthopnea, PND, swelling in lower extremities, anasarca, dizziness, palpitations, syncope.   GI  No heartburn, indigestion, abdominal pain, nausea, vomiting, diarrhea, change in bowel habits, loss of appetite, bloody stools.   Resp: No shortness of breath with exertion or at rest.  No excess mucus, no productive cough,  No non-productive cough,  No coughing up of blood.  No change in color of mucus.  No wheezing.  No chest wall deformity  Skin: no rash or lesions.  GU: no dysuria, change in color of urine, no urgency or frequency.  No flank pain, no hematuria   MS:  No joint pain or swelling.  No decreased range of motion.  No back pain.  Psych:  No change in mood or affect. No depression or anxiety.  No memory loss.   Vital Signs BP 109/72 (BP Location: Left Arm, Patient Position: Sitting, Cuff Size: Large)   Pulse 69   Ht 5\' 11"  (1.803 m)   Wt 157 lb 3.2 oz (71.3 kg)   SpO2 96%   BMI 21.92 kg/m    Physical Exam:  General- No distress,  A&Ox3, appropriate ENT: No sinus tenderness, TM clear, pale nasal mucosa, no oral exudate,no post nasal drip, no LAN Cardiac: S1, S2, regular rate and rhythm, no murmur Chest: No wheeze/ rales/ dullness; no accessory muscle use, no nasal flaring, no sternal retractions Abd.: Soft Non-tender, nondistended, bowel sounds positive,Body mass index is 21.92 kg/m.  Ext: No clubbing cyanosis, edema,  no obvious deformities Neuro:  normal strength, moving all extremities x 4, alert and oriented x 3, appropriate Skin: No rashes, warm and dry, no obvious skin lesions Psych: normal mood and behavior   Assessment/Plan Enlarging pulmonary nodule and a current everyday smoker History of rectal cancer Post  bronchoscopy with biopsies Cytology negative for malignancy Plan I am glad you have done well after the biopsy. Your  biopsy was negative for lung cancer. I am still concerned about this nodule, so we will do a 3 month follow up scan due after August 20th, 2025.  You will follow up with me after this scan to review results. You will get a call to get this scheduled, and for follow up with Isa Manuel NP. Please work on quitting smoking. You can receive free nicotine  replacement therapy ( patches, gum or mints) by calling 1-800-QUIT NOW. Please call so we can get you on the path to becoming  a non-smoker. I know it is hard, but you can do this!  Other options for assistance in smoking cessation ( As covered by your insurance benefits)  Hypnosis for smoking cessation  [employer] [phone]  Acupuncture for smoking cessation  [employer] [phone]    Follow up in 3 months after the scan.  Call to be seen sooner if you continue to have blood in your sputum after the procedure, or you have unexplained weight loss.  Please contact office for sooner follow up if symptoms do not improve or worsen or seek emergency care     I spent 25 minutes dedicated to the care of this patient on the date of this encounter to include pre-visit review of records, face-to-face time with the patient discussing conditions above, post visit ordering of testing, clinical documentation with the electronic health record, making appropriate referrals as documented, and communicating necessary information to the patient's healthcare team.     Raejean Bullock, NP 05/16/2024  8:18 PM

## 2024-06-12 ENCOUNTER — Ambulatory Visit (HOSPITAL_BASED_OUTPATIENT_CLINIC_OR_DEPARTMENT_OTHER): Admitting: Primary Care

## 2024-08-03 ENCOUNTER — Telehealth: Payer: Self-pay

## 2024-08-03 NOTE — Telephone Encounter (Signed)
 Copied from CRM [id number]. Topic: Clinical - Medication Question >> Aug 03, 2024  8:32 AM Joesph PARAS wrote: Reason for CRM: Patient's spouse is calling to request that NP Groce take over and prescribe albuterol  (PROVENTIL ) (2.5 MG/3ML) 0.083% nebulizer solution for patient. Informed that, as NP Groce has not authorized this before, an appointment may be necessary.  States to [facility] on [address].  Called and spoke with patient spouse okay with waiting,still have some left,will route to sarah to advise on this

## 2024-08-07 ENCOUNTER — Ambulatory Visit: Admitting: Acute Care

## 2024-08-07 ENCOUNTER — Encounter: Payer: Self-pay | Admitting: Acute Care

## 2024-08-07 ENCOUNTER — Telehealth (INDEPENDENT_AMBULATORY_CARE_PROVIDER_SITE_OTHER): Admitting: Acute Care

## 2024-08-07 DIAGNOSIS — Z87891 Personal history of nicotine dependence: Secondary | ICD-10-CM | POA: Diagnosis not present

## 2024-08-07 DIAGNOSIS — R911 Solitary pulmonary nodule: Secondary | ICD-10-CM

## 2024-08-07 DIAGNOSIS — R9389 Abnormal findings on diagnostic imaging of other specified body structures: Secondary | ICD-10-CM | POA: Diagnosis not present

## 2024-08-07 DIAGNOSIS — J449 Chronic obstructive pulmonary disease, unspecified: Secondary | ICD-10-CM

## 2024-08-07 DIAGNOSIS — Z85048 Personal history of other malignant neoplasm of rectum, rectosigmoid junction, and anus: Secondary | ICD-10-CM

## 2024-08-07 DIAGNOSIS — J441 Chronic obstructive pulmonary disease with (acute) exacerbation: Secondary | ICD-10-CM | POA: Diagnosis not present

## 2024-08-07 DIAGNOSIS — J432 Centrilobular emphysema: Secondary | ICD-10-CM | POA: Diagnosis not present

## 2024-08-07 MED ORDER — ALBUTEROL SULFATE (2.5 MG/3ML) 0.083% IN NEBU: 2.5000 mg | INHALATION_SOLUTION | Freq: Four times a day (QID) | RESPIRATORY_TRACT | 12 refills | Status: AC | PRN

## 2024-08-07 NOTE — Progress Notes (Signed)
 Virtual Visit via Video Note  I connected with [name] on 08/07/24 at 11:30 AM EDT by a video enabled telemedicine application and verified that I am speaking with the correct person using two identifiers.  Location: Patient:  At home Provider:  [name] [place], [place], [address]    I discussed the limitations of evaluation and management by telemedicine and the availability of in person appointments. The patient expressed understanding and agreed to proceed.  HPI [name] is a 53 year old male with a history of rectal cancer who presents for evaluation of a growing right lower lobe lung nodule. He was referred by Dr. Kassie for evaluation of a growing lung nodule.  Patient is status post chemoradiation and colon resection as well as protctocolectomy in 2017,and  ileostomy,   He also has a significant past medical history for COPD, severe obstructive lung disease, pulmonary emphysema with reduced diffusion capacity    He has a history of rectal cancer and underwent a chest CT in February 2024, which revealed a small left lung nodule measuring 5 mm and a right lower lobe nodule measuring 4 mm. A follow-up CT one year later showed growth of the right lower lobe nodule to 11 mm, prompting further evaluation.   Pt. Denied weight loss or hemoptysis.    A PET scan was performed to better evaluate this finding, revealing mild hypermetabolism in the right lower lobe nodule. There is no evidence of other disease on the PET scan.   He smokes a pack of cigarettes per day.   Pt. Underwent a bronchoscopy with biopsies on 05/07/2024.  Biopsies were negative for malignancy.  He has follow-up CT chest ordered 08/16/2024 as surveillance of these pulmonary nodules. He called today as he is out of his albuterol  neb solution, and cannot wait until his follow-up in September to have this refilled.  He states he has been doing well.  We have renewed his albuterol  neb solution which he uses  twice daily usually in the evening and in the morning.  He has plans to have his follow-up CT done and will follow-up with me as scheduled 08/29/2024 to review the results   Observations/Objective: FINAL MICROSCOPIC DIAGNOSIS:  A. LUNG, RLL NODULE, FINE NEEDLE ASPIRATION AND BIOPSIES:  - No malignant cells identified. See comment.   B. LUNG, RLL NODULE, BRUSHING:  - No malignant cells identified   D. LUNG, RLL CARINA, BIOPSY:  - No malignant cells identified    C. LUNG, RLL, LAVAGE:    FINAL MICROSCOPIC DIAGNOSIS:  - No malignant cells identified    PET Scan 04/26/2024 New mildly metabolic RIGHT lower lobe nodule. Activity similar to background blood pool activity. Activity in the scan is somewhat decreased due to excessive muscle uptake. Lesion remains suspicious for malignancy (potential rectal carcinoma metastasis). Recommend tissue sampling. Centrilobular emphysema in the upper lobes. No evidence of metastatic disease.   CT chest 03/27/2024 Interval development of a solid lobulated 1.1 x 0.8 cm right lower lobe pulmonary nodule. Consider one of the following in 3 months for both low-risk and high-risk individuals: (a) repeat chest CT, (b) follow-up PET-CT, or (c) tissue sampling. This recommendation follows the consensus statement: Guidelines for Management of Incidental Pulmonary Nodules Detected on CT Images: From the Fleischner Society 2017; Radiology 2017; 284:228-243. 2. Cholelithiasis with no acute cholecystitis. 3.  Emphysema (ICD10-J43.9).   02/17/2023 CT Chest Lungs/Pleura: Moderate to severe centrilobular emphysematous changes are noted in the lungs. No consolidation, effusion,  or pneumothorax. Multiple scattered pulmonary nodules bilaterally. On the left, the largest measures 5 mm in the left upper lobe, axial image 43. A 4 mm subpleural nodule is noted in the right lower lobe, axial image 54. 4 mm pulmonary nodule is noted in the left upper lobe, axial  image 40, unchanged additional smaller pulmonary nodules are present bilaterally. No new nodule is seen.  Stable scattered pulmonary nodules bilaterally measuring up to 5 mm. No new nodule is seen. 2. Emphysema. 3. Hepatic steatosis.      Assessment and Plan: Medication refill COPD with emphysema Former smoker History of rectal cancer Pulmonary nodules Plan Continue albuterol  nebulizer treatments twice daily as you have been doing Continue albuterol  as needed for breakthrough shortness of breath CT chest due 08/16/2024 Follow-up with [name] 09/08/2024 to review results Note your daily symptoms > remember red flags for COPD:  Increase in cough, increase in sputum production, increase in shortness of breath or activity intolerance. If you notice these symptoms, please call to be seen.     Follow Up Instructions: CT chest due 08/16/2024 Follow-up with [name] 09/08/2024 to review results   I discussed the assessment and treatment plan with the patient. The patient was provided an opportunity to ask questions and all were answered. The patient agreed with the plan and demonstrated an understanding of the instructions.   The patient was advised to call back or seek an in-person evaluation if the symptoms worsen or if the condition fails to improve as anticipated.  I provided 15 minutes of video -face-to-face time during this encounter.   [name], NP

## 2024-08-07 NOTE — Telephone Encounter (Signed)
 Spoke with the pt's spouse and scheduled appt with Lauraine today for virtual visit. Nothing further needed.

## 2024-08-07 NOTE — Patient Instructions (Addendum)
 It is good to see you today. We have refilled your albuterol  nebulizer treatments as you have requested Continue albuterol  nebulizer treatments twice daily as you have been doing Continue albuterol  as needed for breakthrough shortness of breath CT chest due 08/16/2024 Follow-up with Michaelpaul Apo NP 09/08/2024 to review results Note your daily symptoms > remember red flags for COPD:  Increase in cough, increase in sputum production, increase in shortness of breath or activity intolerance. If you notice these symptoms, please call to be seen.    Please contact office for sooner follow up if symptoms do not improve or worsen or seek emergency care

## 2024-08-16 ENCOUNTER — Ambulatory Visit
Admission: RE | Admit: 2024-08-16 | Discharge: 2024-08-16 | Disposition: A | Discharge status: Home / Self Care | Source: Ambulatory Visit | Attending: Acute Care | Admitting: Acute Care

## 2024-08-16 DIAGNOSIS — R911 Solitary pulmonary nodule: Secondary | ICD-10-CM

## 2024-08-16 DIAGNOSIS — J432 Centrilobular emphysema: Secondary | ICD-10-CM | POA: Diagnosis not present

## 2024-08-17 ENCOUNTER — Encounter: Payer: Self-pay | Admitting: Acute Care

## 2024-08-21 DIAGNOSIS — Z932 Ileostomy status: Secondary | ICD-10-CM | POA: Diagnosis not present

## 2024-08-29 ENCOUNTER — Encounter: Payer: Self-pay | Admitting: Acute Care

## 2024-08-29 ENCOUNTER — Ambulatory Visit (INDEPENDENT_AMBULATORY_CARE_PROVIDER_SITE_OTHER): Admitting: Acute Care

## 2024-08-29 ENCOUNTER — Other Ambulatory Visit: Payer: Self-pay | Admitting: Acute Care

## 2024-08-29 VITALS — BP 105/75 | HR 81 | Temp 97.8°F | Ht 71.0 in | Wt 154.6 lb

## 2024-08-29 DIAGNOSIS — J069 Acute upper respiratory infection, unspecified: Secondary | ICD-10-CM

## 2024-08-29 DIAGNOSIS — R911 Solitary pulmonary nodule: Secondary | ICD-10-CM | POA: Diagnosis not present

## 2024-08-29 DIAGNOSIS — J449 Chronic obstructive pulmonary disease, unspecified: Secondary | ICD-10-CM

## 2024-08-29 DIAGNOSIS — R9389 Abnormal findings on diagnostic imaging of other specified body structures: Secondary | ICD-10-CM

## 2024-08-29 DIAGNOSIS — F17210 Nicotine dependence, cigarettes, uncomplicated: Secondary | ICD-10-CM

## 2024-08-29 DIAGNOSIS — Z720 Tobacco use: Secondary | ICD-10-CM

## 2024-08-29 MED ORDER — TRELEGY ELLIPTA 100-62.5-25 MCG/ACT IN AEPB: 1.0000 | INHALATION_SPRAY | Freq: Every day | RESPIRATORY_TRACT | 6 refills | Status: DC

## 2024-08-29 MED ORDER — DOXYCYCLINE HYCLATE 100 MG PO TABS
100.0000 mg | ORAL_TABLET | Freq: Two times a day (BID) | ORAL | 0 refills | Status: AC
Start: 2024-08-29 — End: ?

## 2024-08-29 NOTE — Patient Instructions (Signed)
 It is good to see you today. We have reviewed your CT scan. The nodule of concern in the right lower lobe has actually decreased in size. However an area in the left lower lobe has continued to slowly grow and is concerning for any indolent adenocarcinoma. Radiology recommend a 6 to 85-month follow-up however we will do a 56-month follow-up as you have had some weight loss. You will get a call to get this scheduled closer to the time. You will follow-up with me 1 to 2 weeks after the scan has been completed. Please work on quitting smoking You can receive free nicotine  replacement therapy (patches, gum, or mints) by calling 1-800-QUIT NOW. Please call so we can get you on the path to becoming a non-smoker. I know it is hard, but you can do this!  Hypnosis for smoking cessation  [employer] [phone]  Acupuncture for smoking cessation  [employer] [phone]    This CT chest shows some infection, so I have sent in doxycycline  1 tablet twice daily for a week Please take until gone Please use sunblock when out in the sun as this medicine makes you sun sensitive. I have started you on Trelegy 1 puff once daily for your shortness of breath. This is a maintenance inhaler that you take every day without fail regardless of how you feel. Rinse mouth after use. Use albuterol  as needed for breakthrough shortness of breath Follow-up in 1 month to ensure that you have been doing well on the Trelegy. Call if you need us  sooner, or have increased weight loss or blood in your sputum. Please contact office for sooner follow up if symptoms do not improve or worsen or seek emergency care

## 2024-08-29 NOTE — Progress Notes (Signed)
 History of Present Illness [name] is a 53 y.o. male current every day smoker referred 04/2024 for evaluation of a growing pulmonary lung nodule in the setting of a history of rectal cancer. He will be followed by Dr. Shelah.   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   Synopsis [name] is a 53 year old male with a history of rectal cancer who presents for evaluation of a growing right lower lobe lung nodule. He was referred by Dr. Kassie for evaluation of a growing lung nodule.   He has a history of rectal cancer and underwent a chest CT in February 2024, which revealed a small left lung nodule measuring 5 mm and a right lower lobe nodule measuring 4 mm. A follow-up CT one year later showed growth of the right lower lobe nodule to 11 mm, prompting further evaluation.   Pt. States he is in his usual state of health. No weight loss or hemoptysis.    A PET scan was performed to better evaluate this finding, revealing mild hypermetabolism in the right lower lobe nodule. There is no evidence of other disease on the PET scan.   He smokes a pack of cigarettes per day.  Expand All Collapse All    History of Present Illness [name] is a 53 y.o. male current every day smoker referred 04/2024 for evaluation of a growing pulmonary lung nodule in the setting of a history of rectal cancer. He will be followed by Dr. Shelah.    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    Synopsis [name] is a 54 year old male with a history of rectal cancer who presents for evaluation of a growing right lower lobe lung nodule. He was referred by Dr. Kassie for evaluation of a growing lung nodule.   He has a history of rectal cancer and underwent a chest CT in February 2024, which revealed a small left lung nodule  measuring 5 mm and a right lower lobe nodule measuring 4 mm. A follow-up CT one year later showed growth of the right lower lobe nodule to 11 mm, prompting further evaluation.   Pt. States he is in his usual state of health. + 3 pound  weight loss no hemoptysis.    A PET scan was performed to better evaluate this finding, revealing mild hypermetabolism in the right lower lobe nodule. There is no evidence of other disease on the PET scan.    Pt. Underwent a bronchoscopy with biopsies on 05/07/2024. Biopsies were negative for malignancy. He is here today to review his short term follow up scan to ensure the nodules of concern are stable.       08/29/2024 Discussed the use of AI scribe software for clinical note transcription with the patient, who gave verbal consent to proceed.  History of Present Illness Pt. Presents to review follow up CT chest to re-evaluate a lung nodule that was negative for malignancy on biopsy.   A recent CT scan shows a right lower lobe nodule that has decreased in size from 13x9 mm to 11x5 mm. However, a left lower lobe nodule has increased to 1.6x0.8 cm. Stable 5 mm nodules are present in the left upper lobe. The CT also reveals advanced emphysema with significant right lower lobe bullous disease and bronchial thickening. Fluid levels  are present in the main bronchi, as well as mucus plugging. Pt. States he does have coughing fits when he eats. I suspect he may have aspirated. I will treat with Doxycycline  100 mg BID x 1 week to see if he improves. He has had about 3 lbs of weight loss over the last 3 months. He states he eats regularly. No significant changes in eating habits are noted.No hemoptysis. I have asked his wife to monitor weight at home, and call to be seen sooner if there is a big drop in weight . Otherwise, we will do a 3 month follow up scan to monitor the left lower lobe ground glass area.    He has experienced worsening breathing and continues to smoke.   His current medications include albuterol  as a rescue inhaler. He has not been on a maintenance inhaler previously. Per PFT's done 02/2024, he has severe COPD. I will start Trelegy 100 daily, and see him in  1 month to see how he is doing. He is to continue his albuterol  as needed for breakthrough shortness of breath.  We spoke at length today, 3-5 minutes,  regarding  his need to quit smoking.He is smoking 1 PPD. I told him he is at a point where he needs to decide if smoking is more important than breathing. See AVS for resources provided to aid in smoking cessation.     Test Results: CT Chest 08/16/2024 The right lower lobe nodule being followed has decreased in size, today 11 x 5 mm, was previously 13 x 9 mm. This might have been an inflammatory nodule. 2. Ill-defined ground-glass nodule in the left lower lobe measuring 1.6 x 0.8 cm, very slightly larger than in 2024. Slow-growing adenocarcinoma is considered. 6-12 month follow-up CT recommended. 3. Stable 5 mm left upper lobe nodules. 4. Advanced centrilobular emphysema with right lower lobe basal bullous disease. 5. Diffuse bronchial thickening slightly increased today, with occasional subsegmental bronchial impactions in the left lower lobe. 6. Fluid levels in both main bronchi, query poor clearing of secretions or aspiration. 7. Stable chronic prominence of lower right paratracheal lymph nodes up to 10 mm. No new or further adenopathy is seen without contrast.  PET Scan 04/26/2024 New mildly metabolic RIGHT lower lobe nodule. Activity similar to background blood pool activity. Activity in the scan is somewhat decreased due to excessive muscle uptake. Lesion remains suspicious for malignancy (potential rectal carcinoma metastasis). Recommend tissue sampling. Centrilobular emphysema in the upper lobes. No evidence of metastatic disease.   CT chest 03/27/2024 Interval development of a solid lobulated 1.1 x 0.8 cm right lower lobe  pulmonary nodule. Consider one of the following in 3 months for both low-risk and high-risk individuals: (a) repeat chest CT, (b) follow-up PET-CT, or (c) tissue sampling. This recommendation follows the consensus statement: Guidelines for Management of Incidental Pulmonary Nodules Detected on CT Images: From the Fleischner Society 2017; Radiology 2017; 284:228-243. 2. Cholelithiasis with no acute cholecystitis. 3.  Emphysema (ICD10-J43.9).   02/17/2023 CT Chest Lungs/Pleura: Moderate to severe centrilobular emphysematous changes are noted in the lungs. No consolidation, effusion, or pneumothorax. Multiple scattered pulmonary nodules bilaterally. On the left, the largest measures 5 mm in the left upper lobe, axial image 43. A 4 mm subpleural nodule is noted in the right lower lobe, axial image 54. 4 mm pulmonary nodule is noted in the left upper lobe, axial image 40, unchanged additional smaller pulmonary nodules are present bilaterally. No new nodule is seen.  Stable scattered pulmonary  nodules bilaterally measuring up to 5 mm. No new nodule is seen. 2. Emphysema. 3. Hepatic steatosis.      Latest Ref Rng & Units 05/07/2024    1:31 PM 06/20/2022    7:35 AM 06/19/2022    2:05 PM  CBC  WBC 4.0 - 10.5 K/uL 8.7  10.4  13.0   Hemoglobin 13.0 - 17.0 g/dL 83.2  86.6  85.8   Hematocrit 39.0 - 52.0 % 49.8  40.7  43.3   Platelets 150 - 400 K/uL 224  126  124        Latest Ref Rng & Units 05/07/2024    1:31 PM 11/02/2023   10:23 AM 06/20/2022    7:35 AM  BMP  Glucose 70 - 99 mg/dL 839  752  856   BUN 6 - 20 mg/dL 5  10  8    Creatinine 0.61 - 1.24 mg/dL 9.00  9.26  9.29   BUN/Creat Ratio 9 - 20  14    Sodium 135 - 145 mmol/L 137  138  137   Potassium 3.5 - 5.1 mmol/L 4.5  4.3  3.6   Chloride 98 - 111 mmol/L 108  104  107   CO2 22 - 32 mmol/L 21  22  25    Calcium  8.9 - 10.3 mg/dL 8.1  8.2  7.6     BNP    Component Value Date/Time   BNP 9.3 06/04/2020 1502    ProBNP No results  found for: PROBNP  PFT    Component Value Date/Time   FEV1PRE 1.30 02/26/2022 0843   FEV1POST 1.86 02/26/2022 0843   FVCPRE 3.17 02/26/2022 0843   FVCPOST 4.45 02/26/2022 0843   TLC 9.11 02/26/2022 0843   DLCOUNC 16.07 02/26/2022 0843   PREFEV1FVCRT 41 02/26/2022 0843   PSTFEV1FVCRT 42 02/26/2022 0843    CT CHEST WO CONTRAST Result Date: 08/27/2024 CLINICAL DATA:  Right lower lobe nodule with low-level PET activity, recently recommended to be biopsied. He has a history of rectal cancer and this is suspected to be a metastasis, but he also has a long smoking history. The nodule is new from 02/16/2023. He had a bronchoalveolar lavage on 05/07/2024 which showed no malignant cells. EXAM: CT CHEST WITHOUT CONTRAST TECHNIQUE: Multidetector CT imaging of the chest was performed following the standard protocol without IV contrast. RADIATION DOSE REDUCTION: This exam was performed according to the departmental dose-optimization program which includes automated exposure control, adjustment of the mA and/or kV according to patient size and/or use of iterative reconstruction technique. COMPARISON:  Portable chest 05/07/2024, PET-CT 04/26/2024, chest CTs with no contrast 03/27/2024 and 04/17/2023. FINDINGS: Cardiovascular: No significant vascular findings. Normal heart size. No pericardial effusion. Mediastinum/Nodes: Unchanged chronic prominence of lower right paratracheal lymph nodes up to 10 mm. No new or further adenopathy is seen without contrast. Evaluation of the hila is limited without contrast. Axillary spaces are clear. Negative trachea, thoracic esophagus, thyroid  gland. Lungs/Pleura: Again noted is advanced centrilobular emphysematous disease of the lungs, with right lower lobe basal large bullous disease. Diffuse bronchial thickening slightly increased today particularly in the lower lobes. There are occasional subsegmental bronchial impactions in the left lower lobe. There are fluid levels in both  main bronchi not seen previously, query poor clearing of secretions or aspiration. Scattered linear scar-like opacities both bases. Right lower lobe nodule being followed has decreased in size. Today this measures 11 x 5 mm on 8:92, was previously 13 x 9 mm and might have been an inflammatory  nodule. In the left lower lobe on 8:111 and 112 there is an ill-defined ground-glass nodule measuring 1.6 x 0.8 cm, very slightly larger than in 2024. Slow-growing adenocarcinoma is considered. This was obscured by atelectasis on the PET-CT. There is a chronic and stable rounded 5 mm left upper lobe nodule medially, again noted on 8:42, stable. Calcified granuloma right upper lobe apex. Stable 5 mm left upper lobe nodule laterally on 8:40. No new or further nodules. There is no consolidation, effusion or pneumothorax. Upper Abdomen: No acute abnormality. Musculoskeletal: No chest wall mass or suspicious bone lesions identified. IMPRESSION: 1. The right lower lobe nodule being followed has decreased in size, today 11 x 5 mm, was previously 13 x 9 mm. This might have been an inflammatory nodule. 2. Ill-defined ground-glass nodule in the left lower lobe measuring 1.6 x 0.8 cm, very slightly larger than in 2024. Slow-growing adenocarcinoma is considered. 6-12 month follow-up CT recommended. 3. Stable 5 mm left upper lobe nodules. 4. Advanced centrilobular emphysema with right lower lobe basal bullous disease. 5. Diffuse bronchial thickening slightly increased today, with occasional subsegmental bronchial impactions in the left lower lobe. 6. Fluid levels in both main bronchi, query poor clearing of secretions or aspiration. 7. Stable chronic prominence of lower right paratracheal lymph nodes up to 10 mm. No new or further adenopathy is seen without contrast. Emphysema (ICD10-J43.9). Electronically Signed   By: Francis Quam M.D.   On: 08/27/2024 01:37     Past medical hx Past Medical History:  Diagnosis Date   Anxiety     C. 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 In families with MSH6 mutations, screening can start at age 66 years since the onset of colon cancer is later in these families.  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 He started smoking about 38 years ago. He has a 38.7 pack-year smoking history. He has never used smokeless tobacco. He reports that he does not drink alcohol and does not use drugs.  Tobacco Cessation: Ready to quit: Not Answered Counseling given: Not Answered Tobacco comments: 1  pack a day KRD 09/07/2024 Current every day smoker . Counseled to quit x 3 minutes  Past surgical hx, Family hx, Social hx all reviewed.  Current Outpatient Medications on  File Prior to Visit  Medication Sig   acetaminophen  (TYLENOL ) 500 MG tablet Take 1,000 mg by mouth every 6 (six) hours as needed for fever or mild pain.   albuterol  (PROVENTIL ) (2.5 MG/3ML) 0.083% nebulizer solution Take 3 mLs (2.5 mg total) by nebulization every 6 (six) hours as needed for wheezing or shortness of breath.   sertraline  (ZOLOFT ) 100 MG tablet Take 1 tablet (100 mg total) by mouth daily.   Spacer/Aero-Holding Chambers (AEROCHAMBER PLUS FLO-VU LARGE) MISC USE AS INSTRUCTED   VENTOLIN  HFA 108 (90 Base) MCG/ACT inhaler TAKE 2 PUFFS BY MOUTH EVERY 6 HOURS AS NEEDED FOR WHEEZE OR SHORTNESS OF BREATH   Accu-Chek Softclix Lancets lancets Use as instructed (Patient not taking: Reported on 08/29/2024)   glucose blood (ACCU-CHEK GUIDE) test strip Use to check blood sugar up to 4 times daily (Patient not taking: Reported on 08/29/2024)   Current Facility-Administered Medications on File Prior to Visit  Medication   sodium chloride  flush (NS) 0.9 % injection 10 mL     Allergies  Allergen Reactions   Hydroxyzine  Other (See Comments)    Made the patient feel not like himself    Review Of Systems:  Constitutional:   + weight loss,No  night sweats,  Fevers, chills, fatigue, or  lassitude.  HEENT:   No headaches,  Difficulty swallowing,  Tooth/dental problems, or  Sore throat,                No sneezing, itching, ear ache, nasal congestion, post nasal drip,   CV:  No chest pain,  Orthopnea, PND, swelling in lower extremities, anasarca, dizziness, palpitations, syncope.   GI  No heartburn, indigestion, abdominal pain, nausea, vomiting, diarrhea, change in bowel habits, loss of appetite, bloody stools.   Resp: + shortness of breath with exertion or at rest.  + excess mucus, + productive cough,  No non-productive cough,  No coughing up of blood.  No change in color of mucus.  No wheezing.  No chest wall deformity  Skin: no rash or lesions.  GU: no dysuria, change in color of urine, no  urgency or frequency.  No flank pain, no hematuria   MS:  No joint pain or swelling.  No decreased range of motion.  No back pain.  Psych:  No change in mood or affect. No depression or anxiety.  No memory loss.   Vital Signs BP 105/75   Pulse 81   Temp 97.8 F (36.6 C) (Oral)   Ht 5' 11 (1.803 m)   Wt 154 lb 9.6 oz (70.1 kg)   SpO2 96%   BMI 21.56 kg/m    Physical Exam:  General- No distress,  A&Ox3, pleasant ENT: No sinus tenderness, TM clear, pale nasal mucosa, no oral exudate,no post nasal drip, no LAN Cardiac: S1, S2, regular rate and rhythm, no murmur Chest: No wheeze/ rales/ dullness; no accessory muscle use, no nasal flaring, no sternal retractions, diminished per bases Abd.: Soft Non-tender, ND, BS +, Body mass index is 21.56 kg/m.  Ext: No clubbing cyanosis, edema, no obvious deformities Neuro:  normal strength, MAE x 4 Skin: No rashes, warm and dry, no obvious skin lesions  Psych: normal mood and behavior   Assessment and Plan Assessment & Plan Enlarging left lower lobe pulmonary nodule Nodule increased to 1.6  x 0.8 cm, potential slow-growing cancer. Current every day smoker  - Order follow-up CT scan in three months. - Consider biopsy if nodule continues to grow. - Monitor for symptoms such as hemoptysis or significant weight loss and report immediately to be seen sooner.  Decreasing right lower lobe pulmonary nodule Nodule decreased from 13 x 9 mm to 11 x 5 mm, reassuring. - Continue surveillance.  Stable left upper lobe pulmonary nodules Nodules stable at 5 mm. - Continue surveillance.  Chronic obstructive pulmonary disease (COPD) with severe obstruction Severe obstruction confirmed by pulmonary function testing. Maintenance inhaler needed. Advanced emphysema with bullous disease and bronchial thickening CT shows advanced emphysema with significant right lower lobe bullous disease and bronchial thickening. - Prescribe Trelegy, one puff once daily,  with instructions to rinse mouth after use. - Schedule follow-up in 30 days to assess response to Trelegy. - Continue albuterol  as needed for shortness of breath or wheezing.   Acute upper respiratory tract infection Symptoms and CT suggest low-grade infection. - Prescribe doxycycline , one tablet in the morning and one in the evening for one week. - Advise on sun sensitivity with doxycycline  and recommend using sunblock when outdoors.  Impaired airway clearance with risk of aspiration CT indicates impaired clearance with fluid levels in bronchi, occasional choking reported. - Advise to eat slowly and take sips of liquid between bites to prevent choking and aspiration. - May need swallow eval if continued question of aspiration  Tobacco use disorder Continued smoking exacerbating COPD symptoms. - Strongly advise smoking cessation. - Discussed quitting smoking x 3 minutes  Unintentional weight loss under evaluation Reported weight loss of three pounds in last 3 months, no appetite changes. - Instruct to weigh daily after urination to monitor weight trends. - Advise to call if significant weight loss continues or if there is hemoptysis.    I spent 45 minutes dedicated to the care of this patient on the date of this encounter to include pre-visit review of records, face-to-face time with the patient discussing conditions above, post visit ordering of testing, clinical documentation with the electronic health record, making appropriate referrals as documented, and communicating necessary information to the patient's healthcare team.    [name], NP 08/29/2024  9:31 AM

## 2024-08-31 MED ORDER — TRELEGY ELLIPTA 100-62.5-25 MCG/ACT IN AEPB: 1.0000 | INHALATION_SPRAY | Freq: Every day | RESPIRATORY_TRACT | 6 refills | Status: DC

## 2024-08-31 NOTE — Telephone Encounter (Signed)
 Please advise on prior auth for trelegy

## 2024-09-06 ENCOUNTER — Encounter: Payer: Self-pay | Admitting: Hematology

## 2024-09-06 ENCOUNTER — Other Ambulatory Visit (HOSPITAL_COMMUNITY): Payer: Self-pay

## 2024-09-06 ENCOUNTER — Telehealth: Payer: Self-pay

## 2024-09-06 NOTE — Telephone Encounter (Signed)
 Patient trelegy denied has to try advair or dulera  first

## 2024-09-06 NOTE — Telephone Encounter (Signed)
*  Pulm  Pharmacy Patient Advocate Encounter   Received notification from Patient Advice Request messages that prior authorization for Trelegy Ellipta  100-62.5-25MCG/ACT aerosol powder   is required/requested.   Insurance verification completed.   The patient is insured through [facility] .   Per test claim: PA required; PA submitted to above mentioned insurance via Latent Key/confirmation #/EOC [id number] Status is pending

## 2024-09-06 NOTE — Telephone Encounter (Signed)
 Pharmacy Patient Advocate Encounter  Received notification from [facility] that Prior Authorization for Trelegy has been DENIED.  Full denial letter will be uploaded to the media tab. See denial reason below.   Per your health plan's criteria, this drug is covered if you meet the following: One of the following: (1) You have failed one preferred drug as confirmed by claims history or submission of medical records. The preferred drugs: Advair Diskus, Dulera  inhaler. (2) You cannot use one preferred drug (please specify contraindication or intolerance). The information provided does not show that you meet the criteria listed above.

## 2024-09-07 ENCOUNTER — Other Ambulatory Visit: Payer: Self-pay | Admitting: Acute Care

## 2024-09-07 DIAGNOSIS — J449 Chronic obstructive pulmonary disease, unspecified: Secondary | ICD-10-CM

## 2024-09-07 MED ORDER — MOMETASONE FURO-FORMOTEROL FUM 100-5 MCG/ACT IN AERO: 2.0000 | INHALATION_SPRAY | Freq: Two times a day (BID) | RESPIRATORY_TRACT | 6 refills | Status: DC

## 2024-09-17 NOTE — Telephone Encounter (Signed)
 Copied from CRM #[id number]. Topic: Clinical - Medical Advice >> Sep 17, 2024 10:04 AM Benton KIDD wrote: Reason for CRM: patient spouse Margarie crawford called sarah groce asked us  to give her a call if he has lost weighht and he has l;ost 6 pounds since September 10th and we just need to know the next steps patient needs to be taken. Please reach out to patient spouse margarie for further assistance  [phone]

## 2024-09-18 NOTE — Telephone Encounter (Signed)
Please advise on update

## 2024-09-27 DIAGNOSIS — Z932 Ileostomy status: Secondary | ICD-10-CM | POA: Diagnosis not present

## 2024-09-28 ENCOUNTER — Telehealth: Payer: Self-pay

## 2024-09-28 NOTE — Telephone Encounter (Signed)
 Patient's spouse Kacie calls. Patient speaking in the background. Patient reports dark red substance oozing from his rectum. This started yesterday. Denies any pain or any lumps felt at the rectum.  Patient had colon cancer and tells me he had the rectum sewn shut. Patient had proctocolectomy and no longer has surveillance colonoscopies.  He will be seeing his pulmonologist next week for lung nodules. He is now scheduled for Wednesday 10/03/24.

## 2024-10-02 ENCOUNTER — Encounter: Payer: Self-pay | Admitting: Acute Care

## 2024-10-02 ENCOUNTER — Ambulatory Visit (INDEPENDENT_AMBULATORY_CARE_PROVIDER_SITE_OTHER): Admitting: Acute Care

## 2024-10-02 VITALS — BP 108/74 | HR 73 | Temp 98.2°F | Ht 71.0 in | Wt 152.4 lb

## 2024-10-02 DIAGNOSIS — R9389 Abnormal findings on diagnostic imaging of other specified body structures: Secondary | ICD-10-CM

## 2024-10-02 DIAGNOSIS — K219 Gastro-esophageal reflux disease without esophagitis: Secondary | ICD-10-CM

## 2024-10-02 DIAGNOSIS — Z23 Encounter for immunization: Secondary | ICD-10-CM

## 2024-10-02 DIAGNOSIS — F17210 Nicotine dependence, cigarettes, uncomplicated: Secondary | ICD-10-CM

## 2024-10-02 DIAGNOSIS — R911 Solitary pulmonary nodule: Secondary | ICD-10-CM

## 2024-10-02 DIAGNOSIS — Z0000 Encounter for general adult medical examination without abnormal findings: Secondary | ICD-10-CM

## 2024-10-02 DIAGNOSIS — F17200 Nicotine dependence, unspecified, uncomplicated: Secondary | ICD-10-CM

## 2024-10-02 DIAGNOSIS — J449 Chronic obstructive pulmonary disease, unspecified: Secondary | ICD-10-CM | POA: Diagnosis not present

## 2024-10-02 NOTE — Patient Instructions (Addendum)
 It is good to see you today. Your weight is stable . This is good news.  Weigh daily on the same scale. Wake up, void, and weigh on the same scale to monitor for weight loss. Add Ensure or Boost as a supplement daily. Continue Dulera  2 puffs twice daily. Rinse mouth after use.  Use albuterol  nebs as needed for breakthrough shortness of breath or wheezing. Follow up CT Chest is due 11/2024. You should get a call to get this scheduled closer to the time. We will follow up to review the results together.  Look into getting something over the counter for your reflux. This will help with your wheezing at bedtime.  Flu and pneumococcal vaccines today Call if you need us  sooner.  Please contact office for sooner follow up if symptoms do not improve or worsen or seek emergency care

## 2024-10-02 NOTE — Progress Notes (Signed)
 History of Present Illness [name] is a 53 y.o. male current every day smoker referred 04/2024 for evaluation of a growing pulmonary lung nodule in the setting of a history of rectal cancer. He will be followed by Dr. Shelah.   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    10/02/2024 Discussed the use of AI scribe software for clinical note transcription with the patient, who gave verbal consent to proceed.  Synopsis [name] is a 53 year old male current every day smoker with a history of rectal cancer who presents for concern for weight loss in the setting of previous rectal cancer and lung nodules.    He has a history of rectal cancer and underwent a chest CT in February 2024, which revealed a small left lung nodule measuring 5 mm and a right lower lobe nodule measuring 4 mm. A follow-up CT one year later showed growth of the right lower lobe nodule to 11 mm, prompting further evaluation. He underwent PET scan 04/2024 which was + for a mildly hypermetabolic right lower lobe lung nodule. This was biopsied 05/07/2024, and was negative for malignancy. Follow up imaging in 07/2024 showed a decrease in the size of the nodule , with plans to do repeat imaging 11/2024 as surveillance.   He is here today because significant other felt he had had an 8 lb weight loss in 6 weeks and was concerned.    Pt. States he is in his usual state of health. Two pound  weight loss when we compare to the most recent weight on our scales in the office. He denies and issues with clothing being too big or hemoptysis.      He continues to smoke a pack of cigarettes per day.  History of Present Illness [name] is a 53 year old male with lung nodules who presents for follow-up on weight loss and lung nodule monitoring. He is accompanied by his mother-in-law.  There was a concern about weight loss raised by his  partner, but the patient himself does not feel concerned and attributes the discrepancy to differences between his home scale and the clinic's scale. The home scale shows an eight-pound difference, while the clinic's scale indicates a two-pound weight loss over six weeks. He feels his weight is stable and does not notice significant changes in clothing fit. He has not been consuming  supplemental shakes like Ensure and Boost to maintain weight, so we have asked him to try to add this to his daily routine.  A recent CT scan in August 2025 showed a decrease in size of the right lower lobe nodule from 13x9 mm to 11x5 mm, and a slight increase in the left lower lobe ground glass nodule to 1.6x0.8 cm. He undergoes regular monitoring with CT scans, with the next one scheduled for December 2025.He denies any hemoptysis. He feels his breathing is much better on the Dulera .  He uses a Dulera  inhaler, taking two puffs in the morning and two in the evening, and reports a decrease in the need for albuterol  nebulizer treatments since starting Dulera . He has 12 refills for his nebulizer solution from [facility]. He experiences wheezing, particularly at night, which may be related to reflux. He takes Tums for heartburn and has previously used Protonix and omeprazole.I have asked him to resume his GERD therapy.  His past medical history includes rectal cancer, which he  mentions in relation to his weight history, noting he used to weigh 185 pounds before his cancer diagnosis. No hemoptysis and reports stable weight aside from the recent two-pound loss.We will continue to monitor this closely.We have explained the patient needs to wake up, void and weight on the same scale each day to trend his weight. I have asked him to start supplementing his meals with Boost or Ensure.  We will do flu and pneumovax for health maintenance today.      Test Results: Cytology 05/07/2024 A. LUNG, RLL NODULE, FINE NEEDLE ASPIRATION AND BIOPSIES:   - No malignant cells identified. See comment.   B. LUNG, RLL NODULE, BRUSHING:  - No malignant cells identified   C. LUNG, RLL, LAVAGE:    FINAL MICROSCOPIC DIAGNOSIS:  - No malignant cells identified   D. LUNG, RLL CARINA, BIOPSY:  - No malignant cells identified      Latest Ref Rng & Units 05/07/2024    1:31 PM 06/20/2022    7:35 AM 06/19/2022    2:05 PM  CBC  WBC 4.0 - 10.5 K/uL 8.7  10.4  13.0   Hemoglobin 13.0 - 17.0 g/dL 83.2  86.6  85.8   Hematocrit 39.0 - 52.0 % 49.8  40.7  43.3   Platelets 150 - 400 K/uL 224  126  124        Latest Ref Rng & Units 05/07/2024    1:31 PM 11/02/2023   10:23 AM 06/20/2022    7:35 AM  BMP  Glucose 70 - 99 mg/dL 839  752  856   BUN 6 - 20 mg/dL 5  10  8    Creatinine 0.61 - 1.24 mg/dL 9.00  9.26  9.29   BUN/Creat Ratio 9 - 20  14    Sodium 135 - 145 mmol/L 137  138  137   Potassium 3.5 - 5.1 mmol/L 4.5  4.3  3.6   Chloride 98 - 111 mmol/L 108  104  107   CO2 22 - 32 mmol/L 21  22  25    Calcium  8.9 - 10.3 mg/dL 8.1  8.2  7.6     BNP    Component Value Date/Time   BNP 9.3 06/04/2020 1502    ProBNP No results found for: PROBNP  PFT    Component Value Date/Time   FEV1PRE 1.30 02/26/2022 0843   FEV1POST 1.86 02/26/2022 0843   FVCPRE 3.17 02/26/2022 0843   FVCPOST 4.45 02/26/2022 0843   TLC 9.11 02/26/2022 0843   DLCOUNC 16.07 02/26/2022 0843   PREFEV1FVCRT 41 02/26/2022 0843   PSTFEV1FVCRT 42 02/26/2022 0843    No results found.   Past medical hx Past Medical History:  Diagnosis Date   Anxiety    C. 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 In families with MSH6 mutations, screening can start at age 58 years since the  onset of colon cancer is later in these families.  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 He started smoking about 38 years ago. He has a 38.8 pack-year smoking history. He has never used smokeless tobacco. He reports that he does not drink alcohol and does not use drugs.  Tobacco Cessation: Ready to quit: Not Answered Counseling given: Not Answered Tobacco comments: 1 pack a day KRD 10/02/2024 Counseled to quit smoking  Past surgical hx, Family hx, Social hx all reviewed.  Current Outpatient Medications on File Prior to Visit  Medication Sig   acetaminophen  (TYLENOL ) 500 MG tablet Take 1,000 mg by mouth every 6 (six) hours as needed for fever or mild pain.   albuterol  (PROVENTIL ) (2.5 MG/3ML) 0.083% nebulizer solution Take 3 mLs (2.5 mg total) by nebulization every 6 (six) hours as needed for wheezing or shortness of breath.   doxycycline  (VIBRA -TABS) 100 MG tablet Take 1 tablet (100 mg total) by mouth 2 (two) times daily.    Fluticasone -Umeclidin-Vilant (TRELEGY ELLIPTA ) 100-62.5-25 MCG/ACT AEPB Inhale 1 puff into the lungs daily at 2 PM.   mometasone -formoterol  (DULERA ) 100-5 MCG/ACT AERO Inhale 2 puffs into the lungs 2 (two) times daily.   sertraline  (ZOLOFT ) 100 MG tablet Take 1 tablet (100 mg total) by mouth daily.   Spacer/Aero-Holding Chambers (AEROCHAMBER PLUS FLO-VU LARGE) MISC USE AS INSTRUCTED   VENTOLIN  HFA 108 (90 Base) MCG/ACT inhaler TAKE 2 PUFFS BY MOUTH EVERY 6 HOURS AS NEEDED FOR WHEEZE OR SHORTNESS OF BREATH   Accu-Chek Softclix Lancets lancets Use as instructed (Patient not taking: Reported on 10/02/2024)   glucose blood (ACCU-CHEK GUIDE) test strip Use to check blood sugar up to 4 times daily (Patient not taking: Reported on 10/02/2024)   Current Facility-Administered Medications on File Prior to Visit  Medication   sodium chloride  flush (NS) 0.9 % injection 10 mL     Allergies  Allergen Reactions   Hydroxyzine  Other (See Comments)    Made the patient feel not like himself    Review Of Systems:  Constitutional:   Slight +   weight loss, No night sweats,  Fevers, chills, fatigue, or  lassitude.  HEENT:   No headaches,  Difficulty swallowing,  Tooth/dental problems, or  Sore throat,                No sneezing, itching, ear ache, nasal congestion, post nasal drip,   CV:  No chest pain,  Orthopnea, PND, swelling in lower extremities, anasarca, dizziness, palpitations, syncope.   GI  No heartburn, indigestion, abdominal pain, nausea, vomiting, diarrhea, change in bowel habits, loss of appetite, bloody stools.   Resp: + baseline  shortness of breath with exertion less at rest.  No  excess mucus, no productive cough,  No non-productive cough,  No coughing up of blood.  No change in color of mucus.  No wheezing.  No chest wall deformity  Skin: no rash or lesions.  GU: no dysuria, change in color of urine, no urgency or frequency.  No flank pain, no hematuria   MS:  No joint pain or  swelling.  No decreased range of motion.  No back pain.  Psych:  No change in mood or affect. No depression or anxiety.  No memory loss.   Vital Signs BP 108/74   Pulse 73   Temp 98.2 F (36.8 C) (Oral)   Ht 5' 11 (1.803 m)   Wt 152 lb 6.4 oz (69.1 kg)   SpO2 96%   BMI 21.26 kg/m    Physical Exam:  General- No distress,  A&Ox3, pleasant ENT: No sinus tenderness, TM clear, pale nasal mucosa, no oral exudate,no post nasal drip, no LAN, beard Cardiac: S1, S2, regular rate and rhythm, no murmur Chest: No wheeze/ rales/ dullness; no accessory muscle use, no nasal flaring, no sternal retractions, diminished per bases Abd.: Soft Non-tender, ND, BS +, Body mass index is 21.26 kg/m.  Ext: No clubbing cyanosis, edema, no obvious deformities Neuro:  normal strength, MAE x 4, A&O x 3 Skin: No rashes, warm and dry, no obvious skin lesions  Psych: normal mood and behavior   Assessment/Plan Assessment & Plan Bilateral lower lobe pulmonary nodules under surveillance Right lower lobe nodule decreased in size, likely inflammatory or infectious.  Left lower lobe nodule slightly larger.  May 2025  PET scan showed mild metabolic activity; biopsy negative for malignancy. - Order follow-up CT chest in December 2025. - Review CT results to assess stability of nodules after scan.  Chronic obstructive pulmonary disease (COPD) COPD managed with Dulera , reducing albuterol  use.  No recent hemoptysis, stable weight.  Dulera  effective and well-tolerated. - Continue Dulera  two puffs twice daily. - Rinse mouth after using Dulera . - Use albuterol  nebulizer as needed for breakthrough shortness of breath. - Administer flu and pneumococcal vaccines today.  Gastroesophageal reflux disease (GERD) with associated wheezing GERD likely contributing to wheezing, especially at bedtime.  Heartburn managed with Tums, stopped PPI previously prescribed.  Reflux may cause airway irritation. - Recommend  over-the-counter Protonix or omeprazole for reflux. - Provide dietary guidance to avoid reflux triggers. - Advise sleeping with head elevated to reduce reflux symptoms.   I spent 25 minutes dedicated to the care of this patient on the date of this encounter to include pre-visit review of records, face-to-face time with the patient discussing conditions above, post visit ordering of testing, clinical documentation with the electronic health record, making appropriate referrals as documented, and communicating necessary information to the patient's healthcare team.     [name], NP 10/02/2024  9:42 AM

## 2024-10-03 ENCOUNTER — Ambulatory Visit: Admitting: Gastroenterology

## 2024-10-03 NOTE — Progress Notes (Deleted)
 [name] [id number] [birth year]   Chief Complaint:  Referring Provider: Diona Perkins, MD Primary GI MD: Dr. Legrand  HPI: [name] is a 53 y.o. male with past medical history of anxiety/depression, CAD, rectal adenocarcinoma s/p proctocolectomy, Lynch syndrome, T2DM, COPD, lung nodules who presents today for a complaint of *** .    Last seen in office 07/27/2016 by Greig Corti, PA-C for rectal mass.  At that time patient endorsed 2 to 2-month history of rectal and low back pain worse with defecation and associated with intermittent rectal bleeding and weight loss.  CT of the abdomen pelvis was consistent with rectal cancer.  Exam at that time also consistent with a rectal tumor.  He underwent colonoscopy 07/28/2016 with finding of a rectal mass, 4 polyps, diverticulosis. Underwent lower endoscopic ultrasound 07/29/2016.  Patient called 09/28/2024 with concern for dark red oozing substance from his rectum.  Had proctocolectomy and no longer had surveillance colonoscopies.  Had actually called about something similar back in 2020 and Dr. Legrand advised to follow-up with surgery as patient actually does not have a rectum after the surgery as he has had done.  Had pouchoscopy in 2019 by Dr. Debby.  Seen by pulmonology yesterday.  Smokes a pack of cigarettes per day.  Had some concern about weight loss but has only lost 2 pounds compared to previous office visit.  Chest CT 08/16/2024 with multiple nodules that are being monitored.  Previous GI Procedures/Imaging     CT A/P 06/18/2022 1. Left scrotal subcutaneus soft tissue edema. No subcutaneus soft tissue emphysema or definite organized fluid collection. Please note necrotizing fasciitis cannot be excluded as this is a clinical diagnosis. Please see separately dictated ultrasound scrotum 06/18/2022. 2. Interval development of spiculated left lower lobe 1.2 x 1.1 cm pulmonary nodule. Finding could represent metastasis in the  setting of prior rectal cancer versus primary lung malignancy. 3. Nonspecific enlarged 1 cm left periaortic lymph node. Recommend attention on follow-up. 4. Cholelithiasis with no CT finding of acute cholecystitis. 5. Surgical changes related to a lower anterior resection and right abdomen ileostomy formation. 6. Emphysema (ICD10-J43.9).  Lower EUS 07/29/2016 - Clearly malignant 6cm long, nearly circumferential mass in the distal rectum with distal edge located 1cm from the anal verge. If this is rectal adenocarcinoma it is uT3N2a, Stage IIIa.  Colonoscopy 07/28/2016 - Rectal mass.  - Four 4 mm polyps in the rectum and in the descending colon, removed with a cold snare. Resected and retrieved.  - Diverticulosis in the left colon.  - Likely malignant tumor in the distal rectum. Biopsied. Tattooed.   Past Medical History:  Diagnosis Date   Anxiety    C. difficile diarrhea 03/30/2018   Colon cancer (HCC) 12/01/2016   COPD (chronic obstructive pulmonary disease) (HCC)    Depression    Diabetes mellitus without complication (HCC)    type 2   Dyspnea    with exertion   Family history of colon cancer    Lung nodule 04/2024   right lower lobe   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 10/13/2016   MSH6 pathogenic mutation  Lynch syndrome screening recs (from up to date)  Annual colonoscopy starting between the ages of 100 and 25 years, or 10 years prior to the earliest age of colon cancer diagnosis in the family (whichever comes first). In families with MSH6 mutations, screening can start at age 97 years since the onset of colon cancer is later in these families.  Annual  screening for endome   Poor dental hygiene    Rectal adenocarcinoma s/p protctocolectomy, IPAA J pouch 12/01/2016 08/06/2016   Status post loop ileostomy takedown 09/22/2017 12/01/2016   Stricture of ileoanal anastomosis s/p dilitations 01/12/2018    Past Surgical History:  Procedure Laterality Date    BRONCHIAL BIOPSY  05/07/2024   Procedure: BRONCHOSCOPY, WITH BIOPSY;  Surgeon: Shelah Lamar RAMAN, MD;  Location: [facility] ENDOSCOPY;  Service: Pulmonary;;   BRONCHIAL BRUSHINGS  05/07/2024   Procedure: BRONCHOSCOPY, WITH BRUSH BIOPSY;  Surgeon: Shelah Lamar RAMAN, MD;  Location: [facility] ENDOSCOPY;  Service: Pulmonary;;   COLON SURGERY     EUS N/A 07/29/2016   Procedure: LOWER ENDOSCOPIC ULTRASOUND (EUS);  Surgeon: Toribio SHAUNNA Cedar, MD;  Location: [facility];  Service: Endoscopy;  Laterality: N/A;   ILEOSTOMY CLOSURE N/A 09/22/2017   Procedure: TAKEDOWN OF LOOP ILEOSTOMY;  Surgeon: Sheldon Standing, MD;  Location: [facility] ORS;  Service: General;  Laterality: N/A;   IR REMOVAL TUN ACCESS W/ PORT W/O FL MOD SED  08/27/2021   LYSIS OF ADHESION N/A 05/10/2018   Procedure: LYSIS OF ADHESION;  Surgeon: Sheldon Standing, MD;  Location: [facility] ORS;  Service: General;  Laterality: N/A;   PORTACATH PLACEMENT N/A 01/26/2017   Procedure: PLACEMENT OF PORT-A-CATH CENTRAL LINE WITH FLUOROSCOPY AND ULTRASOUND;  Surgeon: Standing Sheldon, MD;  Location: [facility] [place];  Service: General;  Laterality: N/A;   POUCHOSCOPY N/A 09/21/2017   Procedure: POUCHOSCOPY;  Surgeon: Debby Hila, MD;  Location: [facility] ENDOSCOPY;  Service: Endoscopy;  Laterality: N/A;   POUCHOSCOPY N/A 01/12/2018   Procedure: POUCHOSCOPY WITH BIOPSIES;  Surgeon: Debby Hila, MD;  Location: [facility] ENDOSCOPY;  Service: Endoscopy;  Laterality: N/A;  Local   PROCTOSCOPY N/A 12/01/2016   Procedure: RIGID PROCTOSCOPY;  Surgeon: Standing Sheldon, MD;  Location: [facility]S;  Service: General;  Laterality: N/A;   RECTAL EXAM UNDER ANESTHESIA N/A 05/10/2018   Procedure: RECTAL EXAM UNDER ANESTHESIA;  Surgeon: Sheldon Standing, MD;  Location: [facility];  Service: General;  Laterality: N/A;   SCROTAL EXPLORATION N/A 09/16/2022   Procedure: SCROTUM EXPLORATION-excision of scrotal sebaceous cyst;  Surgeon: Sherrilee Belvie CROME, MD;  Location: [facility];  Service: Urology;  Laterality: N/A;   TOE AMPUTATION  Left    VIDEO BRONCHOSCOPY WITH ENDOBRONCHIAL NAVIGATION Right 05/07/2024   Procedure: VIDEO BRONCHOSCOPY WITH ENDOBRONCHIAL NAVIGATION;  Surgeon: Shelah Lamar RAMAN, MD;  Location: [facility] ENDOSCOPY;  Service: Pulmonary;  Laterality: Right;   XI ROBOTIC ASSISTED LOWER ANTERIOR RESECTION N/A 12/01/2016   Procedure: XI ROBOTIC ASSISTED PROCTOCOLECTOMY WITH ILLEOPOUCH ANASTAMOSIS WITH DIVERTING ILLEOSTOMY;  Surgeon: Standing Sheldon, MD;  Location: [facility];  Service: General;  Laterality: N/A;   XI ROBOTIC ASSISTED LOWER ANTERIOR RESECTION N/A 05/10/2018   Procedure: XI ROBOTIC RESECTION OF ILEAL J POUCH, LYSIS OF ADHESIONS, WITH CREATION OF PERMANENT END ILEOSTOMY.;  Surgeon: Sheldon Standing, MD;  Location: [facility];  Service: General;  Laterality: N/A;    Current Outpatient Medications  Medication Sig Dispense Refill   Accu-Chek Softclix Lancets lancets Use as instructed (Patient not taking: Reported on 10/02/2024) 100 each 12   acetaminophen  (TYLENOL ) 500 MG tablet Take 1,000 mg by mouth every 6 (six) hours as needed for fever or mild pain.     albuterol  (PROVENTIL ) (2.5 MG/3ML) 0.083% nebulizer solution Take 3 mLs (2.5 mg total) by nebulization every 6 (six) hours as needed for wheezing or shortness of breath. 75 mL 12   doxycycline  (VIBRA -TABS) 100 MG tablet Take 1 tablet (100 mg total) by mouth  2 (two) times daily. 14 tablet 0   Fluticasone -Umeclidin-Vilant (TRELEGY ELLIPTA ) 100-62.5-25 MCG/ACT AEPB Inhale 1 puff into the lungs daily at 2 PM. 60 each 6   glucose blood (ACCU-CHEK GUIDE) test strip Use to check blood sugar up to 4 times daily (Patient not taking: Reported on 10/02/2024) 100 strip 12   mometasone -formoterol  (DULERA ) 100-5 MCG/ACT AERO Inhale 2 puffs into the lungs 2 (two) times daily. 1 each 6   sertraline  (ZOLOFT ) 100 MG tablet Take 1 tablet (100 mg total) by mouth daily. 90 tablet 1   Spacer/Aero-Holding Chambers (AEROCHAMBER PLUS FLO-VU LARGE) MISC USE AS INSTRUCTED 1 each 0   VENTOLIN  HFA 108  (90 Base) MCG/ACT inhaler TAKE 2 PUFFS BY MOUTH EVERY 6 HOURS AS NEEDED FOR WHEEZE OR SHORTNESS OF BREATH 18 each 2   No current facility-administered medications for this visit.   Facility-Administered Medications Ordered in Other Visits  Medication Dose Route Frequency Provider Last Rate Last Admin   sodium chloride  flush (NS) 0.9 % injection 10 mL  10 mL Intravenous PRN Lanny Callander, MD   10 mL at 11/18/17 0953    Allergies as of 10/03/2024 - Review Complete 10/02/2024  Allergen Reaction Noted   Hydroxyzine  Other (See Comments) 06/19/2022    Family History  Problem Relation Age of Onset   Diabetes Mother    COPD Father    Colon cancer Maternal Aunt        dx in her 50s   Diabetes Maternal Grandmother    Brain cancer Maternal Grandfather    Lung cancer Paternal Grandfather    Colon cancer Cousin        dx in her 61s   Bone cancer Sister 8   Pancreatic cancer Neg Hx    Esophageal cancer Neg Hx    Stomach cancer Neg Hx    Liver disease Neg Hx     Social History   Tobacco Use   Smoking status: Every Day    Current packs/day: 1.00    Average packs/day: 1 pack/day for 38.8 years (38.8 ttl pk-yrs)    Types: Cigarettes    Start date: 12/20/1985   Smokeless tobacco: Never   Tobacco comments:    1 pack a day KRD 10/02/2024  Vaping Use   Vaping status: Never Used  Substance Use Topics   Alcohol use: No   Drug use: No    Comment: patient denies     Review of Systems:    Constitutional: No weight loss, fever, chills, weakness or fatigue Eyes: No change in vision Ears, Nose, Throat:  No change in hearing or congestion Skin: No rash or itching Cardiovascular: No chest pain, chest pressure or palpitations   Respiratory: No SOB or cough Gastrointestinal: See HPI and otherwise negative Genitourinary: No dysuria or change in urinary frequency Neurological: No headache, dizziness or syncope Musculoskeletal: No new muscle or joint pain Hematologic: No bleeding or bruising     Physical Exam:  Vital signs: There were no vitals taken for this visit.  Constitutional: NAD, Well developed, Well nourished, alert and cooperative Head:  Normocephalic and atraumatic.  Eyes: No scleral icterus. Conjunctiva pink. Mouth: No oral lesions. Respiratory: Respirations even and unlabored. Lungs clear to auscultation bilaterally.  No wheezes, crackles, or rhonchi.  Cardiovascular:  Regular rate and rhythm. No murmurs. No peripheral edema. Gastrointestinal:  Soft, nondistended, nontender. No rebound or guarding. Normal bowel sounds. No appreciable masses or hepatomegaly. Rectal:  Not performed.  Neurologic:  Alert and oriented x4;  grossly  normal neurologically.  Skin:   Dry and intact without significant lesions or rashes. Psychiatric: Oriented to person, place and time. Demonstrates good judgement and reason without abnormal affect or behaviors.   RELEVANT LABS AND IMAGING: CBC    Component Value Date/Time   WBC 8.7 05/07/2024 1331   RBC 5.64 05/07/2024 1331   HGB 16.7 05/07/2024 1331   HGB 16.4 07/01/2021 1401   HGB 14.9 11/18/2017 0933   HCT 49.8 05/07/2024 1331   HCT 50.8 07/01/2021 1401   HCT 44.6 11/18/2017 0933   PLT 224 05/07/2024 1331   PLT 216 07/01/2021 1401   MCV 88.3 05/07/2024 1331   MCV 89 07/01/2021 1401   MCV 87.8 11/18/2017 0933   MCH 29.6 05/07/2024 1331   MCHC 33.5 05/07/2024 1331   RDW 13.9 05/07/2024 1331   RDW 13.5 07/01/2021 1401   RDW 14.3 11/18/2017 0933   LYMPHSABS 1.3 06/18/2022 2056   LYMPHSABS 1.3 07/01/2021 1401   LYMPHSABS 0.9 11/18/2017 0933   MONOABS 1.3 (H) 06/18/2022 2056   MONOABS 0.5 11/18/2017 0933   EOSABS 0.1 06/18/2022 2056   EOSABS 0.3 07/01/2021 1401   BASOSABS 0.1 06/18/2022 2056   BASOSABS 0.1 07/01/2021 1401   BASOSABS 0.1 11/18/2017 0933    CMP     Component Value Date/Time   NA 137 05/07/2024 1331   NA 138 11/02/2023 1023   NA 139 11/18/2017 0933   K 4.5 05/07/2024 1331   K 3.6 11/18/2017 0933   CL  108 05/07/2024 1331   CO2 21 (L) 05/07/2024 1331   CO2 26 11/18/2017 0933   GLUCOSE 160 (H) 05/07/2024 1331   GLUCOSE 104 11/18/2017 0933   BUN 5 (L) 05/07/2024 1331   BUN 10 11/02/2023 1023   BUN 8.3 11/18/2017 0933   CREATININE 0.99 05/07/2024 1331   CREATININE 0.85 06/26/2020 1300   CREATININE 0.9 11/18/2017 0933   CALCIUM  8.1 (L) 05/07/2024 1331   CALCIUM  8.7 11/18/2017 0933   PROT 5.2 (L) 11/02/2023 1023   PROT 4.9 (L) 11/18/2017 0933   ALBUMIN  3.3 (L) 11/02/2023 1023   ALBUMIN  2.5 (L) 11/18/2017 0933   AST 16 11/02/2023 1023   AST 30 06/26/2020 1300   AST 24 11/18/2017 0933   ALT 17 11/02/2023 1023   ALT 40 06/26/2020 1300   ALT 15 11/18/2017 0933   ALKPHOS 64 11/02/2023 1023   ALKPHOS 61 11/18/2017 0933   BILITOT <0.2 11/02/2023 1023   BILITOT 0.2 (L) 06/26/2020 1300   BILITOT 0.33 11/18/2017 0933   GFRNONAA >60 05/07/2024 1331   GFRNONAA >60 06/26/2020 1300   GFRAA 107 10/24/2020 0940   GFRAA >60 06/26/2020 1300     Assessment/Plan:    Needs urgent referral to [facility] (Dr. Sheldon?) and needs to follow-up with oncology   Camie Furbish, PA-C [facility] 10/03/2024, 8:37 AM  Patient Care Team: Diona Perkins, MD as PCP - Diedre Sheldon Standing, MD as Consulting Physician (General Surgery) [name], Victory LITTIE MOULD, MD as Consulting Physician (Gastroenterology) Dewey Rush, MD as Consulting Physician (Radiation Oncology) Lanny Callander, MD as Consulting Physician (Oncology) Kassie Acquanetta Bradley, MD as Consulting Physician (Pulmonary Disease)

## 2024-10-19 NOTE — Telephone Encounter (Signed)
Patient was a no show for his appointment

## 2024-10-30 ENCOUNTER — Other Ambulatory Visit: Payer: Self-pay | Admitting: Family Medicine

## 2024-10-30 ENCOUNTER — Other Ambulatory Visit (HOSPITAL_BASED_OUTPATIENT_CLINIC_OR_DEPARTMENT_OTHER): Payer: Self-pay | Admitting: Acute Care

## 2024-10-30 NOTE — Telephone Encounter (Signed)
 Chart reviewed. Rx refilled.

## 2024-11-14 ENCOUNTER — Other Ambulatory Visit: Payer: Self-pay | Admitting: Family Medicine

## 2024-11-14 NOTE — Telephone Encounter (Signed)
 Wife calls nurse line regarding Sertraline  refill.   Patient has been taking 100 mg BID. Wife states that patient has been on increased dose for awhile now. They report they were instructed by our office to make this increase.   I was unable to find this in recent documentation.   Will forward to PCP for clarification.   Chiquita JAYSON English, RN

## 2024-11-19 NOTE — Telephone Encounter (Signed)
 Called patient and LVM advising to return call to office to schedule follow up.   Chiquita JAYSON English, RN

## 2024-11-30 ENCOUNTER — Inpatient Hospital Stay: Admission: RE | Admit: 2024-11-30 | Discharge: 2024-11-30 | Attending: Acute Care | Admitting: Acute Care

## 2024-11-30 DIAGNOSIS — J432 Centrilobular emphysema: Secondary | ICD-10-CM | POA: Diagnosis not present

## 2024-11-30 DIAGNOSIS — R911 Solitary pulmonary nodule: Secondary | ICD-10-CM

## 2024-11-30 DIAGNOSIS — R918 Other nonspecific abnormal finding of lung field: Secondary | ICD-10-CM | POA: Diagnosis not present

## 2024-12-07 ENCOUNTER — Encounter: Payer: Self-pay | Admitting: Acute Care

## 2024-12-07 ENCOUNTER — Ambulatory Visit: Admitting: Acute Care

## 2024-12-07 VITALS — BP 111/76 | HR 89 | Temp 98.2°F | Ht 72.0 in | Wt 155.2 lb

## 2024-12-07 DIAGNOSIS — J449 Chronic obstructive pulmonary disease, unspecified: Secondary | ICD-10-CM | POA: Diagnosis not present

## 2024-12-07 DIAGNOSIS — R9389 Abnormal findings on diagnostic imaging of other specified body structures: Secondary | ICD-10-CM

## 2024-12-07 DIAGNOSIS — I2721 Secondary pulmonary arterial hypertension: Secondary | ICD-10-CM

## 2024-12-07 DIAGNOSIS — F17200 Nicotine dependence, unspecified, uncomplicated: Secondary | ICD-10-CM

## 2024-12-07 DIAGNOSIS — R911 Solitary pulmonary nodule: Secondary | ICD-10-CM

## 2024-12-07 DIAGNOSIS — G4700 Insomnia, unspecified: Secondary | ICD-10-CM

## 2024-12-07 DIAGNOSIS — R0609 Other forms of dyspnea: Secondary | ICD-10-CM

## 2024-12-07 MED ORDER — BREZTRI AEROSPHERE 160-9-4.8 MCG/ACT IN AERO: 2.0000 | INHALATION_SPRAY | Freq: Two times a day (BID) | RESPIRATORY_TRACT | Status: DC

## 2024-12-07 NOTE — Patient Instructions (Addendum)
 It is good to see you today. We have reviewed your CT Chest.  The right lower lobe nodule is smaller than the last time we looked.  I am still concerned about this with your history of rectal cancer. We will do a 3 month follow up scan to keep a close eye on this. This will be due mid March 2026 I have referred you to cardiology. You will get a call to get this scheduled. We will go ahead and order a 2 D echo to have available for when you see cardiology. We will walk you today to see if you need oxygen. If you qualify, we will get it ordered. I have also ordered an overnight oxygen test to see if you are dropping your oxygen levels while you sleep. You will get a call to get this scheduled . If you drop your levels, you will need night time oxygen. We will give you some samples of Breztri. Use 2 puffs in the morning and 2 puffs in the evening. Rinse mouth after use. We will see if you qualify for financial assistance.  I have ordered Pulmonary Function tests. You will get a call to get this scheduled.  Work on quitting smoking. You can receive free nicotine  replacement therapy (patches, gum, or mints) by calling 1-800-QUIT NOW. Please call so we can get you on the path to becoming a non-smoker. I know it is hard, but you can do this!  Hypnosis for smoking cessation  [employer] [phone]  Acupuncture for smoking cessation  [employer] [phone]

## 2024-12-07 NOTE — Progress Notes (Signed)
 "  History of Present Illness [name] is a 53 y.o. male current every day smoker referred 04/2024 for evaluation of a growing pulmonary lung nodule in the setting of a history of rectal cancer. He will be followed by Dr. Shelah.    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   Synopsis [name] is a 53 year old male current every day smoker with a history of rectal cancer who presents for concern for weight loss in the setting of previous rectal cancer and lung nodules.    He has a history of rectal cancer and underwent a chest CT in February 2024, which revealed a small left lung nodule measuring 5 mm and a right lower lobe nodule measuring 4 mm. A follow-up CT one year later showed growth of the right lower lobe nodule to 11 mm, prompting further evaluation. He underwent PET scan 04/2024 which was + for a mildly hypermetabolic right lower lobe lung nodule. This was biopsied 05/07/2024, and was negative for malignancy. Follow up imaging in 07/2024 showed a decrease in the size of the nodule , with plans to do repeat imaging 11/2024 as surveillance.     Pt. States he is in his usual state of health. Two pound  weight loss when we compare to the most recent weight on our scales in the office. He denies and issues with clothing being too big or hemoptysis.      He continues to smoke a pack of cigarettes per day.     12/07/2024 Discussed the use of AI scribe software for clinical note transcription with the patient, who gave verbal consent to proceed.  Synopsis [name] is a 53 year old male with a history of rectal cancer who presents for evaluation of a growing right lower lobe lung nodule. He was referred by Dr. Kassie for evaluation of a growing lung nodule.   He has a history of rectal cancer and underwent a chest CT in February 2024, which revealed a small left lung nodule measuring 5 mm and a  right lower lobe nodule measuring 4 mm. A follow-up CT one year later showed growth of the right lower lobe nodule to 11 mm, prompting further evaluation.    A PET scan was performed to better evaluate this finding, revealing mild hypermetabolism in the right lower lobe nodule. There is no evidence of other disease on the PET scan.  Pt. Underwent bronchoscopy with biopsies 05/07/2024. The biopsy results from the right lower lobe lung nodule and RLL carina  were negative for malignancy. Plan was for a 3 month follow up Ct Chest which was done 08/16/2024. This showed further decrease in  size of the RLL nodule which was reassuring.     He smokes a pack of cigarettes per day.  History of Present Illness [name] is a 54 year old male with rectal cancer who presents for follow-up of pulmonary nodules and evaluation of worsening shortness of breath.  He has a history of rectal cancer and is being monitored for pulmonary nodules. A recent scan shows that the right lower lobe nodule has decreased in size from nine by thirteen millimeters to five millimeters, and it is stable from the previous scan in August 2025. The nodule has shown hypermetabolic activity, and there are additional scattered pulmonary nodules measuring up to five millimeters.  He experiences worsening shortness of breath, which has become  more pronounced since his last visit. He has significant fatigue and difficulty breathing with minimal exertion, such as getting up from the couch to go to the bathroom. No weight loss or hemoptysis. His family member notes that he 'sleeps all the time' and has 'no energy.'  He smokes approximately a pack of cigarettes a day, although some days he smokes less. He is not currently using any inhalers except for albuterol  and Dulera , which he takes twice daily. He has not been using Breztri due to insurance issues.  He has not seen a cardiologist yet, and there is a noted increase in his shortness of  breath.  His family member reports that he does not snore significantly, suggesting that sleep apnea may not be a concern, but there is a suspicion that his oxygen levels may drop at night, contributing to his symptoms.     Test Results: CT chest 11/30/2024 5 mm posterolateral right lower lobe nodule is stable from 08/08/2024 but has decreased slightly in size from 04/26/2024, at which time it was shown to be hypermetabolic. Recommend continued attention on follow-up. 2. Peribronchovascular patchy ground-glass in the left lower lobe, somewhat distorted by respiratory motion, but grossly similar. Findings may be postinfectious or postinflammatory in etiology. Recommend attention on follow-up. 3. Liver appears steatotic. 4. Cholelithiasis. 5. Enlarged pulmonic trunk, indicative of pulmonary arterial hypertension. 6.  Emphysema (ICD10-J43.9).  CT Chest 08/16/2024 The right lower lobe nodule being followed has decreased in size, today 11 x 5 mm, was previously 13 x 9 mm. This might have been an inflammatory nodule. 2. Ill-defined ground-glass nodule in the left lower lobe measuring 1.6 x 0.8 cm, very slightly larger than in 2024. Slow-growing adenocarcinoma is considered. 6-12 month follow-up CT recommended. 3. Stable 5 mm left upper lobe nodules. 4. Advanced centrilobular emphysema with right lower lobe basal bullous disease. 5. Diffuse bronchial thickening slightly increased today, with occasional subsegmental bronchial impactions in the left lower lobe. 6. Fluid levels in both main bronchi, query poor clearing of secretions or aspiration. 7. Stable chronic prominence of lower right paratracheal lymph nodes up to 10 mm. No new or further adenopathy is seen without contrast.  05/16/2024 Cytology FINAL MICROSCOPIC DIAGNOSIS:  A. LUNG, RLL NODULE, FINE NEEDLE ASPIRATION AND BIOPSIES:  - No malignant cells identified. See comment.   B. LUNG, RLL NODULE, BRUSHING:  - No malignant  cells identified   D. LUNG, RLL CARINA, BIOPSY:  - No malignant cells identified    C. LUNG, RLL, LAVAGE:    FINAL MICROSCOPIC DIAGNOSIS:  - No malignant cells identified    PET Scan 04/26/2024 New mildly metabolic RIGHT lower lobe nodule. Activity similar to background blood pool activity. Activity in the scan is somewhat decreased due to excessive muscle uptake. Lesion remains suspicious for malignancy (potential rectal carcinoma metastasis). Recommend tissue sampling. Centrilobular emphysema in the upper lobes. No evidence of metastatic disease.   CT chest 03/27/2024 Interval development of a solid lobulated 1.1 x 0.8 cm right lower lobe pulmonary nodule. Consider one of the following in 3 months for both low-risk and high-risk individuals: (a) repeat chest CT, (b) follow-up PET-CT, or (c) tissue sampling. This recommendation follows the consensus statement: Guidelines for Management of Incidental Pulmonary Nodules Detected on CT Images: From the Fleischner Society 2017; Radiology 2017; 284:228-243. 2. Cholelithiasis with no acute cholecystitis. 3.  Emphysema (ICD10-J43.9).   02/17/2023 CT Chest Lungs/Pleura: Moderate to severe centrilobular emphysematous changes are noted in the lungs. No consolidation, effusion, or pneumothorax. Multiple  scattered pulmonary nodules bilaterally. On the left, the largest measures 5 mm in the left upper lobe, axial image 43. A 4 mm subpleural nodule is noted in the right lower lobe, axial image 54. 4 mm pulmonary nodule is noted in the left upper lobe, axial image 40, unchanged additional smaller pulmonary nodules are present bilaterally. No new nodule is seen.  Stable scattered pulmonary nodules bilaterally measuring up to 5 mm. No new nodule is seen. 2. Emphysema. 3. Hepatic steatosis.    Latest Ref Rng & Units 05/07/2024    1:31 PM 06/20/2022    7:35 AM 06/19/2022    2:05 PM  CBC  WBC 4.0 - 10.5 K/uL 8.7  10.4  13.0   Hemoglobin 13.0 -  17.0 g/dL 83.2  86.6  85.8   Hematocrit 39.0 - 52.0 % 49.8  40.7  43.3   Platelets 150 - 400 K/uL 224  126  124        Latest Ref Rng & Units 05/07/2024    1:31 PM 11/02/2023   10:23 AM 06/20/2022    7:35 AM  BMP  Glucose 70 - 99 mg/dL 839  752  856   BUN 6 - 20 mg/dL 5  10  8    Creatinine 0.61 - 1.24 mg/dL 9.00  9.26  9.29   BUN/Creat Ratio 9 - 20  14    Sodium 135 - 145 mmol/L 137  138  137   Potassium 3.5 - 5.1 mmol/L 4.5  4.3  3.6   Chloride 98 - 111 mmol/L 108  104  107   CO2 22 - 32 mmol/L 21  22  25    Calcium  8.9 - 10.3 mg/dL 8.1  8.2  7.6     BNP    Component Value Date/Time   BNP 9.3 06/04/2020 1502    ProBNP No results found for: PROBNP  PFT    Component Value Date/Time   FEV1PRE 1.30 02/26/2022 0843   FEV1POST 1.86 02/26/2022 0843   FVCPRE 3.17 02/26/2022 0843   FVCPOST 4.45 02/26/2022 0843   TLC 9.11 02/26/2022 0843   DLCOUNC 16.07 02/26/2022 0843   PREFEV1FVCRT 41 02/26/2022 0843   PSTFEV1FVCRT 42 02/26/2022 0843    CT CHEST WO CONTRAST Result Date: 12/03/2024 CLINICAL DATA:  Lung nodule. EXAM: CT CHEST WITHOUT CONTRAST TECHNIQUE: Multidetector CT imaging of the chest was performed following the standard protocol without IV contrast. RADIATION DOSE REDUCTION: This exam was performed according to the departmental dose-optimization program which includes automated exposure control, adjustment of the mA and/or kV according to patient size and/or use of iterative reconstruction technique. COMPARISON:  08/16/2024, 03/27/2024 and 02/16/2023.  PET 04/26/2024. FINDINGS: Cardiovascular: Enlarged pulmonic trunk. Heart size normal. No pericardial effusion. Mediastinum/Nodes: Thoracic inlet lymph nodes are not enlarged by CT size criteria. Mediastinal lymph nodes measure up to 10 mm in the low right paratracheal station, unchanged from 02/16/2023. Hilar regions are difficult to definitively evaluate without IV contrast. No axillary adenopathy. Air in the esophagus can be  seen with dysmotility. Lungs/Pleura: Centrilobular and paraseptal emphysema. Tiny juxtapleural nodules are considered benign. Calcified granulomas. Scattered mucoid impaction. 5 mm posterolateral right lower lobe nodule (3 x 7 mm, 5/85) appears stable from 08/08/2024 and slightly decreased in size from 04/26/2024, at which time it measured 9 x 13 mm. A few additional scattered pulmonary nodules measure up to 5 mm in the medial left upper lobe (5/38), present dating back to at least 02/16/2023. Scattered peribronchovascular ground-glass in the peripheral left lower  lobe, unchanged. There is respiratory motion, degrading image quality in this area. No new pulmonary nodules. No pleural fluid. Airway is unremarkable. Upper Abdomen: Liver is slightly decreased in attenuation diffusely. Gallstones. Left periaortic lymph nodes measure up to 10 mm, chronically stable. Small bowel mesenteric lymph nodes measure up to 9 mm (2/163). Visualized portions of the liver, gallbladder, adrenal glands, kidneys, spleen, pancreas, stomach and bowel are otherwise grossly unremarkable. No upper abdominal adenopathy. Musculoskeletal: Degenerative changes in the spine. IMPRESSION: 1. 5 mm posterolateral right lower lobe nodule is stable from 08/08/2024 but has decreased slightly in size from 04/26/2024, at which time it was shown to be hypermetabolic. Recommend continued attention on follow-up. 2. Peribronchovascular patchy ground-glass in the left lower lobe, somewhat distorted by respiratory motion, but grossly similar. Findings may be postinfectious or postinflammatory in etiology. Recommend attention on follow-up. 3. Liver appears steatotic. 4. Cholelithiasis. 5. Enlarged pulmonic trunk, indicative of pulmonary arterial hypertension. 6.  Emphysema (ICD10-J43.9). Electronically Signed   By: Newell Eke M.D.   On: 12/03/2024 15:50     Past medical hx Past Medical History:  Diagnosis Date   Anxiety    C. 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 In families with MSH6 mutations, screening can start at age 72 years since the onset of colon cancer is later in these families.  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 He started smoking about 38 years ago. He has a 39 pack-year smoking history. He has never used smokeless tobacco. He reports that he does not drink alcohol and does not use drugs.  Tobacco Cessation: Ready to quit: Not Answered Counseling given: Not Answered Tobacco comments: 1 pack a day KRD 12/07/2024 Current every day smoker , counseled to quit x 3 minutes today in the office.   Past surgical hx, Family hx, Social hx all reviewed.  Current Outpatient Medications on File Prior to Visit  Medication Sig   acetaminophen  (TYLENOL ) 500 MG tablet Take 1,000 mg by mouth every 6 (six) hours as needed for fever or mild pain.   albuterol  (PROVENTIL ) (2.5 MG/3ML) 0.083% nebulizer solution Take 3 mLs (2.5 mg total) by nebulization every 6 (six) hours as needed for wheezing or shortness of breath.   glucose blood (ACCU-CHEK GUIDE) test strip Use to check blood  sugar up to 4 times daily   mometasone -formoterol  (DULERA ) 100-5 MCG/ACT AERO Inhale 2 puffs into the lungs 2 (two) times daily.   sertraline  (ZOLOFT ) 100 MG tablet TAKE 1 TABLET BY MOUTH EVERY DAY   Spacer/Aero-Holding Chambers (AEROCHAMBER PLUS FLO-VU LARGE) MISC USE AS INSTRUCTED   VENTOLIN   HFA 108 (90 Base) MCG/ACT inhaler TAKE 2 PUFFS BY MOUTH EVERY 6 HOURS AS NEEDED FOR WHEEZE OR SHORTNESS OF BREATH   Accu-Chek Softclix Lancets lancets Use as instructed (Patient not taking: Reported on 12/07/2024)   Current Facility-Administered Medications on File Prior to Visit  Medication   sodium chloride  flush (NS) 0.9 % injection 10 mL     Allergies[2]  Review Of Systems:  Constitutional:   No  weight loss, night sweats,  Fevers, chills, fatigue, or  lassitude.  HEENT:   No headaches,  Difficulty swallowing,  Tooth/dental problems, or  Sore throat,                No sneezing, itching, ear ache, nasal congestion, post nasal drip,   CV:  No chest pain,  Orthopnea, PND, swelling in lower extremities, anasarca, dizziness, palpitations, syncope.   GI  No heartburn, indigestion, abdominal pain, nausea, vomiting, diarrhea, change in bowel habits, loss of appetite, bloody stools.   Resp: + shortness of breath with exertion less at rest.  No excess mucus, no productive cough,  No non-productive cough,  No coughing up of blood.  No change in color of mucus.  No wheezing.  No chest wall deformity  Skin: no rash or lesions.  GU: no dysuria, change in color of urine, no urgency or frequency.  No flank pain, no hematuria   MS:  No joint pain or swelling.  No decreased range of motion.  No back pain.  Psych:  No change in mood or affect. No depression or anxiety.  No memory loss.   Vital Signs BP 111/76   Pulse 89   Temp 98.2 F (36.8 C) (Oral)   Ht 6' (1.829 m)   Wt 155 lb 3.2 oz (70.4 kg)   SpO2 95%   BMI 21.05 kg/m    Physical Exam: Physical Exam MEASUREMENTS: Weight- 150. GENERAL:  No distress, alert and oriented times 3. EARS NOSE THROAT: No sinus tenderness, tympanic membranes clear, pale nasal mucosa, no oral exudate, no post nasal drip, no lymphadenopathy. CHEST: Lungs clear to auscultation, no wheeze, rales, dullness, no accessory muscle use, no nasal flaring, no sternal retractions. CARDIAC: S1, S2, regular rate and rhythm, no murmur. ABDOMINAL: Soft, non tender. ND, BS present. EXTREMITIES: No clubbing, cyanosis, edema. No obvious deformities. NEUROLOGICAL: Normal strength. Alert and oriented x 3, MAE x 4. SKIN: No rashes, warm and dry. No obvious skin lesions. PSYCHIATRIC: Normal mood and behavior.   Assessment/Plan  Assessment and Plan Assessment & Plan Pulmonary nodule under surveillance Right lower lobe nodule decreased to 5 mm with hypermetabolic activity onprevious PET scan .  Scattered nodules up to 5 mm. History of rectal cancer necessitates close monitoring.  Biopsy if nodule reaches 7 mm. - Ordered CT chest follow-up in three months to monitor nodule size. - Follow up in March 2026 after scan - Advised to return sooner if experiencing unexplained weight loss or hemoptysis.  Suspect Secondary pulmonary arterial hypertension Enlarged pulmonic trunk on CT chest,  ? If possible  PAH  is contributing to dyspnea.  Cardiology evaluation needed for heart function and pulmonary artery pressure assessment. - Referred to cardiology for evaluation of pulmonary arterial hypertension. - Ordered 2D echocardiogram to assess heart function and pulmonary artery pressure.  Chronic obstructive pulmonary disease Using Dulera  and albuterol  but bnot effective.  Breztri financial assistance pursued.  Dyspnea and fatigue may relate to COPD exacerbation or pulmonary arterial hypertension. - Provided Breztri samples, two puffs in the morning  and two puffs in the evening, with mouth rinsing after use. - Ordered pulmonary function testing. - Referred to online tobacco  cessation class starting January 7th. - Financial assistance for Breztri initiated  Nicotine  dependence Continued smoking exacerbates pulmonary conditions and health decline.  Smoking cessation crucial for respiratory and cardiovascular health improvement. - Provided smoking cessation resources. - Encouraged participation in online tobacco cessation class.  Hypoxemia, under evaluation Worsening dyspnea and fatigue suggest hypoxemia, especially during exertion or sleep.  Overnight oximetry and ambulatory oxygen testing planned. - Ordered overnight oximetry to assess nocturnal oxygen levels. - Conducted ambulatory oxygen testing to evaluate oxygen levels during exertion. - Will consider oxygen therapy if hypoxemia is confirmed below 88% with walk.>> Patient did not drop sats below 88% after 3 laps.  Insomnia May relate to nocturnal hypoxemia, contributing to fatigue and health decline. - Ordered overnight oximetry to assess for nocturnal hypoxemia. - Will consider oxygen therapy if nocturnal hypoxemia is confirmed.  AVS 12/07/2024 We have reviewed your CT Chest.  The right lower lobe nodule is smaller than the last time we looked.  I am still concerned about this with your history of rectal cancer. We will do a 3 month follow up scan to keep a close eye on this. This will be due mid March 2026 I have referred you to cardiology. You will get a call to get this scheduled. We will go ahead and order a 2 D echo to have available for when you see cardiology. We will walk you today to see if you need oxygen. If you qualify, we will get it ordered. I have also ordered an overnight oxygen test to see if you are dropping your oxygen levels while you sleep. You will get a call to get this scheduled . If you drop your levels, you will need night time oxygen. We will give you some samples of Breztri. Use 2 puffs in the morning and 2 puffs in the evening. Rinse mouth after use. We will see if  you qualify for financial assistance.  I have ordered Pulmonary Function tests. You will get a call to get this scheduled.  Work on quitting smoking. You can receive free nicotine  replacement therapy (patches, gum, or mints) by calling 1-800-QUIT NOW. Please call so we can get you on the path to becoming a non-smoker. I know it is hard, but you can do this!  Hypnosis for smoking cessation  [employer] [phone]  Acupuncture for smoking cessation  [employer] [phone]    I spent 35 minutes dedicated to the care of this patient on the date of this encounter to include pre-visit review of records, face-to-face time with the patient discussing conditions above, post visit ordering of testing, clinical documentation with the electronic health record, making appropriate referrals as documented, and communicating necessary information to the patient's healthcare team.      [name], NP 12/07/2024  9:20 AM             [1]  Social History Tobacco Use   Smoking status: Every Day    Current packs/day: 1.00    Average packs/day: 1 pack/day for 39.0 years (39.0 ttl pk-yrs)    Types: Cigarettes    Start date: 12/20/1985   Smokeless tobacco: Never   Tobacco comments:    1 pack a day KRD 12/07/2024  Vaping Use   Vaping status: Never Used  Substance Use Topics   Alcohol use: No   Drug use: No  Comment: patient denies  [2]  Allergies Allergen Reactions   Hydroxyzine  Other (See Comments)    Made the patient feel not like himself   "

## 2024-12-10 NOTE — Progress Notes (Signed)
 "  [facility]:    Date:  12/18/2024   ID:  [name], DOB [birth year], MRN [id number]  PCP:  Diona Perkins, MD  Cardiologist:  None     Referring MD: Ruthell Lauraine FALCON, NP   Chief Complaint: dyspnea on exertion  History of Present Illness:    [name] is a 53 y.o. 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  Patient has a history of COPD with advanced emphysema noted on prior CT scans as well as Lynch syndrome with prior rectal adenocarcinoma as described above. Chest CT in 03/2024 showed growth in a known lung nodule prompting further evaluation. PET scan in 04/2024 was positive for a mildly hypermetabolic right lower lobe lung nodule. This was biopsied later that month and was negative for malignancy. Follow-up CT showed a decreased in size of the pulmonary nodule. He has been following with Pulmonology. Most recent chest CT on 11/30/2024 showed a peribronchovascular patchy ground-glass in the left lower lobe similar to prior imaging, emphysema, and an enlarged pulmonic trunk indicative of pulmonary arterial hypertension. He was seen by Pulmonology on 12/07/2024 and reported worsening dyspnea on exertion with minimal activity and decreased energy/ fatigue. He was referred to Cardiology. Echo, PFTs, and overnight oximetry to assess for nocturnal O2 levels were also ordered. Echo on 12/11/2024 showed LVEF of 55-60% with normal wall motion and diastolic parameters, normal RV size with low normal function, and no significant valvular disease. The TR signal was inadequate for assessing PA pressure. PFTs were suggestive of COPD.   Patient has chronic dyspnea due to COPD; however, he has had worsening dyspnea on exertion for the  last 3-4 months and now will get short of breath with minimal activity such as walking to the bathroom.  He describes nocturnal shortness of breath as well and will sometimes wake up in a panic because he feels short of breath.  He also sleeps on 3-4 pillows but states he has done this since his bowel surgery in 2017.  He denies any edema.  He also reports left-sided nonradiating chest pain that he describes as a cramping sensation that will last for 3-4 minutes at a time.  This started a couple weeks ago and he has 2-3 episodes per week.  These episodes occur both at rest and with exertion but are not worse with exertion.  No palpitations, lightheadedness/dizziness, or syncope.  He states he was diagnosed with COPD about 3-4 years ago.  He unfortunately continues to smoke.  He has smoked for at least 35 years and continues to smoke 1 pack/day.  He also reports smoking Marijuana multiple times a week.  No alcohol use.  He does have a family history of cardiovascular disease.  His mother had a heart attack in her 51s.  EKGs/Labs/Other Studies Reviewed:    The following studies were reviewed:  Echocardiogram 12/11/2024: Impressions: 1. Left ventricular ejection fraction, by estimation, is 55 to 60%. The  left ventricle has normal function. The left ventricle has no regional  wall motion abnormalities. Left ventricular diastolic parameters were  normal.   2. Right ventricular systolic function is low normal. The right  ventricular size is normal. Tricuspid regurgitation signal is inadequate  for assessing PA pressure.   3.  The mitral valve is grossly normal. No evidence of mitral valve  regurgitation. No evidence of mitral stenosis.   4. The aortic valve is tricuspid. Aortic valve regurgitation is not  visualized. No aortic stenosis is present.   5. The inferior vena cava is normal in size with greater than 50%  respiratory variability, suggesting right atrial pressure of 3 mmHg.   EKG:  EKG  ordered today.   EKG Interpretation Date/Time:  Tuesday December 18 2024 09:16:21 EST Ventricular Rate:  61 PR Interval:  128 QRS Duration:  96 QT Interval:  436 QTC Calculation: 438 R Axis:   82  Text Interpretation: Normal sinus rhythm Normal ECG When compared with ECG of 07-May-2024 13:05, No significant change was found Confirmed by Jadine Patient [phone]) on 12/18/2024 9:25:46 AM    Recent Labs: 05/07/2024: BUN 5; Creatinine, Ser 0.99; Hemoglobin 16.7; Platelets 224; Potassium 4.5; Sodium 137  Recent Lipid Panel    Component Value Date/Time   CHOL 147 09/25/2020 1001   TRIG 140 09/25/2020 1001   HDL 43 09/25/2020 1001   CHOLHDL 3.4 09/25/2020 1001   LDLCALC 79 09/25/2020 1001    Physical Exam:    Vital Signs: BP 111/74 (BP Location: Left Arm, Patient Position: Sitting, Cuff Size: Normal)   Pulse 61   Resp 16   Ht 6' (1.829 m)   Wt 159 lb 12.8 oz (72.5 kg)   SpO2 98%   BMI 21.67 kg/m     Wt Readings from Last 3 Encounters:  12/18/24 159 lb 12.8 oz (72.5 kg)  12/07/24 155 lb 3.2 oz (70.4 kg)  10/02/24 152 lb 6.4 oz (69.1 kg)     General: 53 y.o. male in no acute distress. HEENT: Normocephalic and atraumatic. Sclera clear.  Neck: Supple. No carotid bruits. No JVD. Heart: RRR. Distinct S1 and S2. No murmurs, gallops, or rubs.  Lungs: No increased work of breathing. Decreased breath sounds bilaterally. No wheezes, rhonchi, or rales.  Extremities: No lower extremity edema.   Skin: Warm and dry. Neuro: No focal deficits. Psych: Normal affect. Responds appropriately.  Assessment:    1. Chest pain of uncertain etiology   2. Dyspnea on exertion   3. Type 2 diabetes mellitus without complication, without long-term current use of insulin  (HCC)   4. Tobacco abuse   5. Marijuana abuse     Plan:    Chest Pain Patient reports new chest pain over the last couple weeks that he describes as a cramping sensation that occurs both at rest and with exertion. - EKG  today shows normal sinus rhythm with no acute ischemic changes. - His symptoms sound more atypical but he has multiple cardiovascular risk factors including type 2 diabetes, significant tobacco use, and family history of CAD.  Will order coronary CTA to rule out obstructive CAD.  Will provide a one-time dose of Lopressor  50 mg for him to take 2 hours before CTA. Will check CMET today. Will also check lipid panel as it has been several years since he has had this.  Dyspnea on Exertion Patient has a history of COPD and has had worsening dyspnea on exertion over the last 3-4 months. Recent CT in 11/2024 showed emphysema and an enlarged pulmonic trunk indicative of pulmonary arterial hypertension. Echo showed LVEF of 55-60% with normal wall motion and diastolic parameters, normal RV size with low normal function, and no significant valvular disease. The TR signal was inadequate for assessing PA pressure. He was referred to Cardiology for evaluation of  pulmonary arterial hypertension.  - He does describe some nocturnal dyspnea as well but I suspect this is more due to pulmonary issues. He is euvolemic on exam so do not think this if from CHF. However, will order BNP today since we are getting other labs. - Personally reviewed chest CT with Dr. Jeffrie yesterday. It is difficult to assess the true border of the pulmonary artery without contrast; however, it appears to be an overgenerous read of the pulmonic trunk based on a comparison to a chest CT with contrast back in 2021.  Even if his pulmonic trunk is enlarged, I suspect this is likely due to his COPD.  PFTs on 12/11/2024 look suggestive of severe COPD (FEV1/ FVC 45% and FEV1 41% predicted post BD). I don't think a right heart catheterization is necessary at this time.  We are getting a coronary CTA as above for further evaluation of chest pain. This will hopefully provide a better evaluation of the pulmonic trunk as well. If coronary CTA shows a high risk  lesion warranting a left heart cath, we can perform a right heart cath at the same time but do not think this is necessary right now. - Continue management of COPD per Pulmonology.  Type 2 Diabetes Mellitus - Not on any medications.  - Management per PCP.   Tobacco Abuse Patient has smoked for at least 35 years and continues to smoke 1 pack/day. - Discussed the importance of complete cessation.  He is not ready to quit at this time.  Marijuana Abuse Patient smokes marijuana multiple times a week. - Cessation recommended.   Disposition: Follow up in about 1 month after coronary CTA. If coronary CTA shows no significant CAD, he can follow-up with us  as needed going forward.    Signed, Kaedence Connelly E Cheyrl Buley, PA-C  12/18/2024 4:53 PM    [facility] "

## 2024-12-11 ENCOUNTER — Ambulatory Visit (HOSPITAL_COMMUNITY)
Admission: RE | Admit: 2024-12-11 | Discharge: 2024-12-11 | Disposition: A | Discharge status: Home / Self Care | Source: Ambulatory Visit | Attending: Acute Care | Admitting: Acute Care

## 2024-12-11 ENCOUNTER — Ambulatory Visit (INDEPENDENT_AMBULATORY_CARE_PROVIDER_SITE_OTHER)

## 2024-12-11 DIAGNOSIS — I071 Rheumatic tricuspid insufficiency: Secondary | ICD-10-CM | POA: Insufficient documentation

## 2024-12-11 DIAGNOSIS — F17200 Nicotine dependence, unspecified, uncomplicated: Secondary | ICD-10-CM | POA: Diagnosis not present

## 2024-12-11 DIAGNOSIS — E119 Type 2 diabetes mellitus without complications: Secondary | ICD-10-CM | POA: Diagnosis not present

## 2024-12-11 DIAGNOSIS — J439 Emphysema, unspecified: Secondary | ICD-10-CM | POA: Diagnosis not present

## 2024-12-11 DIAGNOSIS — R0609 Other forms of dyspnea: Secondary | ICD-10-CM | POA: Diagnosis not present

## 2024-12-11 DIAGNOSIS — E785 Hyperlipidemia, unspecified: Secondary | ICD-10-CM | POA: Insufficient documentation

## 2024-12-11 DIAGNOSIS — I272 Pulmonary hypertension, unspecified: Secondary | ICD-10-CM | POA: Diagnosis present

## 2024-12-11 LAB — PULMONARY FUNCTION TEST
DL/VA % pred: 43 %
DL/VA: 1.9 ml/min/mmHg/L
DLCO unc % pred: 42 %
DLCO unc: 12.28 ml/min/mmHg
FEF 25-75 Post: 0.77 L/s
FEF 25-75 Pre: 0.66 L/s
FEF2575-%Change-Post: 17 %
FEF2575-%Pred-Post: 23 %
FEF2575-%Pred-Pre: 20 %
FEV1-%Change-Post: 11 %
FEV1-%Pred-Post: 41 %
FEV1-%Pred-Pre: 37 %
FEV1-Post: 1.58 L
FEV1-Pre: 1.41 L
FEV1FVC-%Change-Post: 6 %
FEV1FVC-%Pred-Pre: 45 %
FEV6-%Change-Post: 6 %
FEV6-%Pred-Post: 85 %
FEV6-%Pred-Pre: 79 %
FEV6-Post: 4.05 L
FEV6-Pre: 3.8 L
FEV6FVC-%Change-Post: 1 %
FEV6FVC-%Pred-Post: 98 %
FEV6FVC-%Pred-Pre: 97 %
FVC-%Change-Post: 5 %
FVC-%Pred-Post: 86 %
FVC-%Pred-Pre: 82 %
FVC-Post: 4.29 L
FVC-Pre: 4.07 L
Post FEV1/FVC ratio: 37 %
Post FEV6/FVC ratio: 94 %
Pre FEV1/FVC ratio: 35 %
Pre FEV6/FVC Ratio: 93 %
RV % pred: 224 %
RV: 4.75 L
TLC % pred: 137 %
TLC: 9.61 L

## 2024-12-11 LAB — ECHOCARDIOGRAM COMPLETE
Area-P 1/2: 3.37 cm2
Calc EF: 47.8 %
S' Lateral: 2.8 cm
Single Plane A2C EF: 44.8 %
Single Plane A4C EF: 48.9 %

## 2024-12-11 NOTE — Progress Notes (Signed)
" °  Echocardiogram 2D Echocardiogram has been performed.  LAMON MAXWELL 12/11/2024, 10:27 AM "

## 2024-12-11 NOTE — Progress Notes (Signed)
 Full PFT performed today.

## 2024-12-11 NOTE — Patient Instructions (Signed)
 Full PFT performed today.

## 2024-12-11 NOTE — Telephone Encounter (Signed)
 Copied from CRM #[id number]. Topic: Appointments - Scheduling Inquiry for Clinic >> Dec 07, 2024 12:26 PM Whitney O wrote: Reason for CRM: patient wife called to get him scheduled for his PFT and provider is requesting follow up appointment in 1 to 2 weeks with NP Sarah groce and we are not able to schedule for her . Please reach out to patient wife to get him scheduled  [phone] >> Dec 07, 2024  2:37 PM Nurse Karna NOVAK, RN wrote: First available appt is fine >> Dec 07, 2024  1:38 PM Cranford PARAS wrote: I do not see that he is due for an appointment soon. Please contact patient if needed for an appointment.    Called and spoke with the pts girlfriend (DPR) and advised PFT is scheduled for 12/23 and asked if anything else was needed.  Pts girlfriend states nothing else is needed.  NFN

## 2024-12-18 ENCOUNTER — Other Ambulatory Visit (HOSPITAL_COMMUNITY): Payer: Self-pay

## 2024-12-18 ENCOUNTER — Encounter: Payer: Self-pay | Admitting: Student

## 2024-12-18 ENCOUNTER — Ambulatory Visit: Admitting: Student

## 2024-12-18 VITALS — BP 111/74 | HR 61 | Resp 16 | Ht 72.0 in | Wt 159.8 lb

## 2024-12-18 DIAGNOSIS — E119 Type 2 diabetes mellitus without complications: Secondary | ICD-10-CM | POA: Insufficient documentation

## 2024-12-18 DIAGNOSIS — R0609 Other forms of dyspnea: Secondary | ICD-10-CM | POA: Diagnosis not present

## 2024-12-18 DIAGNOSIS — R079 Chest pain, unspecified: Secondary | ICD-10-CM | POA: Insufficient documentation

## 2024-12-18 DIAGNOSIS — F1210 Cannabis abuse, uncomplicated: Secondary | ICD-10-CM | POA: Insufficient documentation

## 2024-12-18 DIAGNOSIS — Z720 Tobacco use: Secondary | ICD-10-CM | POA: Insufficient documentation

## 2024-12-18 MED ORDER — METOPROLOL TARTRATE 50 MG PO TABS
50.0000 mg | ORAL_TABLET | Freq: Once | ORAL | 0 refills | Status: AC
  Filled 2024-12-18: qty 1, 1d supply, fill #0

## 2024-12-18 NOTE — Telephone Encounter (Signed)
 Copied from CRM [id number]. Topic: Clinical - Lab/Test Results >> Dec 17, 2024  2:42 PM Corean SAUNDERS wrote: Reason for CRM: Patients wife, [name] is requesting to know how long the overnight oximetry test results will take to come back. [name] states [name] or nurse can respond through MyChart.

## 2024-12-18 NOTE — Patient Instructions (Addendum)
 Medication Instructions:  ONCE Metoprolol  50mg  Take 1 tablet (50 mg total) by mouth once. Take 90-120 minutes prior to scan. Hold for SBP less than 110.  *If you need a refill on your cardiac medications before your next appointment, please call your pharmacy*  Lab Work: LIPID, CMET, BMP (Today) If you have labs (blood work) drawn today and your tests are completely normal, you will receive your results only by: MyChart Message (if you have MyChart) OR A paper copy in the mail If you have any lab test that is abnormal or we need to change your treatment, we will call you to review the results.  Testing/Procedures: CTA  Your physician has requested that you have cardiac CT. Cardiac computed tomography (CT) is a painless test that uses an x-ray machine to take clear, detailed pictures of your heart. For further information please visit [email or web address] Please follow instruction sheet as given.    Follow-Up: At [facility], you and your health needs are our priority.  As part of our continuing mission to provide you with exceptional heart care, our providers are all part of one team.  This team includes your primary Cardiologist (physician) and Advanced Practice Providers or APPs (Physician Assistants and Nurse Practitioners) who all work together to provide you with the care you need, when you need it.  Your next appointment:   1 month(s)  Provider:   Jettie Mannor, PA-C   We recommend signing up for the patient portal called MyChart.  Sign up information is provided on this After Visit Summary.  MyChart is used to connect with patients for Virtual Visits (Telemedicine).  Patients are able to view lab/test results, encounter notes, upcoming appointments, etc.  Non-urgent messages can be sent to your provider as well.   To learn more about what you can do with MyChart, go to [email or web address].

## 2024-12-19 LAB — COMPREHENSIVE METABOLIC PANEL WITH GFR
ALT: 19 IU/L (ref 0–44)
AST: 21 IU/L (ref 0–40)
Albumin: 3.1 g/dL — ABNORMAL LOW (ref 3.8–4.9)
Alkaline Phosphatase: 56 IU/L (ref 47–123)
BUN/Creatinine Ratio: 10 (ref 9–20)
BUN: 8 mg/dL (ref 6–24)
Bilirubin Total: 0.3 mg/dL (ref 0.0–1.2)
CO2: 21 mmol/L (ref 20–29)
Calcium: 8.4 mg/dL — ABNORMAL LOW (ref 8.7–10.2)
Chloride: 105 mmol/L (ref 96–106)
Creatinine, Ser: 0.84 mg/dL (ref 0.76–1.27)
Globulin, Total: 1.8 g/dL (ref 1.5–4.5)
Glucose: 184 mg/dL — ABNORMAL HIGH (ref 70–99)
Potassium: 4.3 mmol/L (ref 3.5–5.2)
Sodium: 139 mmol/L (ref 134–144)
Total Protein: 4.9 g/dL — ABNORMAL LOW (ref 6.0–8.5)
eGFR: 104 mL/min/1.73

## 2024-12-19 LAB — LIPID PANEL
Chol/HDL Ratio: 3.2 ratio (ref 0.0–5.0)
Cholesterol, Total: 146 mg/dL (ref 100–199)
HDL: 46 mg/dL
LDL Chol Calc (NIH): 87 mg/dL (ref 0–99)
Triglycerides: 66 mg/dL (ref 0–149)
VLDL Cholesterol Cal: 13 mg/dL (ref 5–40)

## 2024-12-19 LAB — BRAIN NATRIURETIC PEPTIDE: BNP: 10.2 pg/mL (ref 0.0–100.0)

## 2024-12-20 ENCOUNTER — Ambulatory Visit: Payer: Self-pay | Admitting: Student

## 2024-12-21 ENCOUNTER — Telehealth: Payer: Self-pay | Admitting: Acute Care

## 2024-12-21 NOTE — Telephone Encounter (Signed)
 Can we get this patient scheduled for a follow up to go over PFT's and see if we can get the overnight oximetry results back? Thanks so muchj

## 2024-12-24 NOTE — Telephone Encounter (Signed)
 Patient scheduled overnight oximetry being picked up today 1/5 will review pft on 1/9

## 2024-12-28 ENCOUNTER — Other Ambulatory Visit: Payer: Self-pay | Admitting: Acute Care

## 2024-12-28 ENCOUNTER — Encounter: Payer: Self-pay | Admitting: Acute Care

## 2024-12-28 ENCOUNTER — Ambulatory Visit: Admitting: Acute Care

## 2024-12-28 VITALS — BP 104/69 | HR 72 | Temp 97.9°F | Ht 71.0 in | Wt 157.0 lb

## 2024-12-28 DIAGNOSIS — F17210 Nicotine dependence, cigarettes, uncomplicated: Secondary | ICD-10-CM

## 2024-12-28 DIAGNOSIS — J449 Chronic obstructive pulmonary disease, unspecified: Secondary | ICD-10-CM

## 2024-12-28 DIAGNOSIS — J439 Emphysema, unspecified: Secondary | ICD-10-CM

## 2024-12-28 MED ORDER — BUDESONIDE 0.25 MG/2ML IN SUSP: 2.0000 mL | Freq: Two times a day (BID) | RESPIRATORY_TRACT | 11 refills | Status: AC

## 2024-12-28 MED ORDER — ARFORMOTEROL TARTRATE 15 MCG/2ML IN NEBU: 15.0000 ug | INHALATION_SOLUTION | Freq: Two times a day (BID) | RESPIRATORY_TRACT | 6 refills | Status: DC

## 2024-12-28 NOTE — Progress Notes (Signed)
 "  History of Present Illness [name] is a 54 y.o. male current every day smoker referred 04/2024 for evaluation of a growing pulmonary lung nodule in the setting of a history of rectal cancer. He will be followed by Dr. Shelah.    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     12/28/2024 Discussed the use of AI scribe software for clinical note transcription with the patient, who gave verbal consent to proceed.  Synopsis [name] is a 54 year old male current every day smoker with a history of rectal cancer who presents for concern for weight loss in the setting of previous rectal cancer and lung nodules.    He has a history of rectal cancer and underwent a chest CT in February 2024, which revealed a small left lung nodule measuring 5 mm and a right lower lobe nodule measuring 4 mm. A follow-up CT one year later showed growth of the right lower lobe nodule to 11 mm, prompting further evaluation. He underwent PET scan 04/2024 which was + for a mildly hypermetabolic right lower lobe lung nodule. This was biopsied 05/07/2024, and was negative for malignancy. Follow up imaging in 07/2024 showed a decrease in the size of the nodule , with plans to do repeat imaging 11/2024 as surveillance.     Pt. States he is in his usual state of health. Two pound  weight loss when we compare to the most recent weight on our scales in the office. He denies and issues with clothing being too big or hemoptysis.   After the 12/07/2024 appointment   History of Present Illness Pt. Presents for  follow up after PFT's and heart Echo to further evaluate his COPD.  Patient continues to smoke 1 pack/day.  We have reviewed the pulmonary function test.  FF ratio is 35%, FVC is 4.07, FEV1 is 1.41, diffusion capacity is 42% Patient shows severe obstructive defect with air trapping and reduced gas exchange consistent with emphysema.   Patient did have good bronchodilator response on pulmonary function testing. We did a therapeutic trial with Breztri .  Patient feels it does not make a lot of difference to his dyspnea. He states that nebulized treatments are better for him. He is in agreement with switching to nebulization for his pulmonary treatments. We have ordered Brovana  and Pulmicort  Nebs. We investigated Yupelri cost, and it will be cost prohibitive.  We will add Ohtuvayre  . This will need to be sent to the pharmacy team to see if it will be covered.   Echo was reviewed. He has follow up with cardiology after he has a Calcium  Scoring scan to better evaluate Pulmonary Artery for PAH as PA Pressures were not able to be estimated on Echo. They will also evaluate for consideration of a stent  placement.   He did his overnight oximetry 1/5, returned the test 1/6. We will notify him once the results have been read and finalized.   Pt. Was counseled extensively today on smoking cessation. We discussed that he needs to quit smoking, as his emphysema will continue to progress, and his dyspnea will worsen.  Follow up in 3 months  to review  repeat CT as part of nodule surveillance and to see how he is doing on nebulized maintenance.      Test Results: Echo 12/11/2024        PFT 12/07/2024  CT chest 11/30/2024 5 mm posterolateral right lower lobe nodule is stable from 08/08/2024 but has decreased slightly in size from 04/26/2024, at which time it was shown to be hypermetabolic. Recommend continued attention on follow-up. 2. Peribronchovascular patchy ground-glass in the left lower lobe, somewhat distorted by respiratory motion, but grossly similar. Findings may be postinfectious or postinflammatory in etiology. Recommend attention on follow-up. 3. Liver appears steatotic. 4. Cholelithiasis. 5. Enlarged pulmonic trunk, indicative of pulmonary arterial hypertension. 6.  Emphysema  (ICD10-J43.9).   CT Chest 08/16/2024 The right lower lobe nodule being followed has decreased in size, today 11 x 5 mm, was previously 13 x 9 mm. This might have been an inflammatory nodule. 2. Ill-defined ground-glass nodule in the left lower lobe measuring 1.6 x 0.8 cm, very slightly larger than in 2024. Slow-growing adenocarcinoma is considered. 6-12 month follow-up CT recommended. 3. Stable 5 mm left upper lobe nodules. 4. Advanced centrilobular emphysema with right lower lobe basal bullous disease. 5. Diffuse bronchial thickening slightly increased today, with occasional subsegmental bronchial impactions in the left lower lobe. 6. Fluid levels in both main bronchi, query poor clearing of secretions or aspiration. 7. Stable chronic prominence of lower right paratracheal lymph nodes up to 10 mm. No new or further adenopathy is seen without contrast.   05/16/2024 Cytology FINAL MICROSCOPIC DIAGNOSIS:  A. LUNG, RLL NODULE, FINE NEEDLE ASPIRATION AND BIOPSIES:  - No malignant cells identified. See comment.   B. LUNG, RLL NODULE, BRUSHING:  - No malignant cells identified   D. LUNG, RLL CARINA, BIOPSY:  - No malignant cells identified    C. LUNG, RLL, LAVAGE:    FINAL MICROSCOPIC DIAGNOSIS:  - No malignant cells identified    PET Scan 04/26/2024 New mildly metabolic RIGHT lower lobe nodule. Activity similar to background blood pool activity. Activity in the scan is somewhat decreased due to excessive muscle uptake. Lesion remains suspicious for malignancy (potential rectal carcinoma metastasis). Recommend tissue sampling. Centrilobular emphysema in the upper lobes. No evidence of metastatic disease.   CT chest 03/27/2024 Interval development of a solid lobulated 1.1 x 0.8 cm right lower lobe pulmonary nodule. Consider one of the following in 3 months for both low-risk and high-risk individuals: (a) repeat chest CT, (b) follow-up PET-CT, or (c) tissue sampling.  This recommendation follows the consensus statement: Guidelines for Management of Incidental Pulmonary Nodules Detected on CT Images: From the Fleischner Society 2017; Radiology 2017; 284:228-243. 2. Cholelithiasis with no acute cholecystitis. 3.  Emphysema (ICD10-J43.9).   02/17/2023 CT Chest Lungs/Pleura: Moderate to severe centrilobular emphysematous changes are noted in the lungs. No consolidation, effusion, or pneumothorax. Multiple scattered pulmonary nodules bilaterally. On the left, the largest measures 5 mm in the left upper lobe, axial image 43. A 4 mm subpleural nodule is noted in the right lower lobe, axial image 54. 4 mm pulmonary nodule is noted in the left upper lobe, axial image 40, unchanged additional smaller pulmonary nodules are present bilaterally. No new nodule is seen.  Stable scattered pulmonary nodules bilaterally measuring up to 5 mm. No new nodule is seen. 2. Emphysema. 3. Hepatic steatosis.    He continues to smoke a pack of cigarettes per day.    Latest Ref Rng & Units 05/07/2024    1:31 PM 06/20/2022    7:35 AM 06/19/2022    2:05 PM  CBC  WBC 4.0 - 10.5 K/uL 8.7  10.4  13.0   Hemoglobin 13.0 - 17.0 g/dL 83.2  86.6  85.8   Hematocrit  39.0 - 52.0 % 49.8  40.7  43.3   Platelets 150 - 400 K/uL 224  126  124        Latest Ref Rng & Units 12/18/2024   10:22 AM 05/07/2024    1:31 PM 11/02/2023   10:23 AM  BMP  Glucose 70 - 99 mg/dL 815  839  752   BUN 6 - 24 mg/dL 8  5  10    Creatinine 0.76 - 1.27 mg/dL 9.15  9.00  9.26   BUN/Creat Ratio 9 - 20 10   14    Sodium 134 - 144 mmol/L 139  137  138   Potassium 3.5 - 5.2 mmol/L 4.3  4.5  4.3   Chloride 96 - 106 mmol/L 105  108  104   CO2 20 - 29 mmol/L 21  21  22    Calcium  8.7 - 10.2 mg/dL 8.4  8.1  8.2     BNP    Component Value Date/Time   BNP 10.2 12/18/2024 1022    ProBNP No results found for: PROBNP  PFT    Component Value Date/Time   FEV1PRE 1.41 12/11/2024 1439   FEV1POST 1.58  12/11/2024 1439   FVCPRE 4.07 12/11/2024 1439   FVCPOST 4.29 12/11/2024 1439   TLC 9.61 12/11/2024 1439   DLCOUNC 12.28 12/11/2024 1439   PREFEV1FVCRT 35 12/11/2024 1439   PSTFEV1FVCRT 37 12/11/2024 1439    ECHOCARDIOGRAM COMPLETE Result Date: 12/11/2024    ECHOCARDIOGRAM REPORT   Patient Name:   ZEBEDEE SEGUNDO [place] Date of Exam: 12/11/2024 Medical Rec #:  [id number]   Height:       72.0 in Accession #:    [id number]  Weight:       155.2 lb Date of Birth:  16-Nov-1971   BSA:          1.913 m Patient Age:    53 years    BP:           114/76 mmHg Patient Gender: M           HR:           64 bpm. Exam Location:  Outpatient Procedure: 2D Echo (Both Spectral and Color Flow Doppler were utilized during            procedure). Indications:    pulmonary hypertension  History:        Patient has no prior history of Echocardiogram examinations.                 Emphysema; Risk Factors:Diabetes, Dyslipidemia and Current                 Smoker.  Sonographer:    Tinnie Barefoot RDCS Referring Phys: [address] Lavan Imes  Sonographer Comments: Image acquisition challenging due to respiratory motion. IMPRESSIONS  1. Left ventricular ejection fraction, by estimation, is 55 to 60%. The left ventricle has normal function. The left ventricle has no regional wall motion abnormalities. Left ventricular diastolic parameters were normal.  2. Right ventricular systolic function is low normal. The right ventricular size is normal. Tricuspid regurgitation signal is inadequate for assessing PA pressure.  3. The mitral valve is grossly normal. No evidence of mitral valve regurgitation. No evidence of mitral stenosis.  4. The aortic valve is tricuspid. Aortic valve regurgitation is not visualized. No aortic stenosis is present.  5. The inferior vena cava is normal in size with greater than 50% respiratory variability, suggesting right atrial pressure of 3 mmHg. FINDINGS  Left Ventricle: Left ventricular ejection fraction, by estimation, is 55 to  60%. The left ventricle has normal function. The left ventricle has no regional wall motion abnormalities. The left ventricular internal cavity size was normal in size. There is  no left ventricular hypertrophy. Left ventricular diastolic parameters were normal. Right Ventricle: The right ventricular size is normal. No increase in right ventricular wall thickness. Right ventricular systolic function is low normal. Tricuspid regurgitation signal is inadequate for assessing PA pressure. Left Atrium: Left atrial size was normal in size. Right Atrium: Right atrial size was normal in size. Pericardium: There is no evidence of pericardial effusion. Mitral Valve: The mitral valve is grossly normal. No evidence of mitral valve regurgitation. No evidence of mitral valve stenosis. Tricuspid Valve: The tricuspid valve is grossly normal. Tricuspid valve regurgitation is not demonstrated. No evidence of tricuspid stenosis. Aortic Valve: The aortic valve is tricuspid. Aortic valve regurgitation is not visualized. No aortic stenosis is present. Pulmonic Valve: The pulmonic valve was not well visualized. Pulmonic valve regurgitation is not visualized. Aorta: The aortic root is normal in size and structure. Venous: The inferior vena cava is normal in size with greater than 50% respiratory variability, suggesting right atrial pressure of 3 mmHg. IAS/Shunts: The atrial septum is grossly normal.  LEFT VENTRICLE PLAX 2D LVIDd:         4.20 cm     Diastology LVIDs:         2.80 cm     LV e' medial:    9.25 cm/s LV PW:         0.90 cm     LV E/e' medial:  7.8 LV IVS:        0.80 cm     LV e' lateral:   13.10 cm/s LVOT diam:     2.06 cm     LV E/e' lateral: 5.5 LV SV:         62 LV SV Index:   33 LVOT Area:     3.33 cm LV IVRT:       102 msec  LV Volumes (MOD) LV vol d, MOD A2C: 64.9 ml LV vol d, MOD A4C: 70.1 ml LV vol s, MOD A2C: 35.8 ml LV vol s, MOD A4C: 35.8 ml LV SV MOD A2C:     29.1 ml LV SV MOD A4C:     70.1 ml LV SV MOD BP:       33.3 ml RIGHT VENTRICLE          IVC RV Basal diam:  2.17 cm  IVC diam: 1.90 cm TAPSE (M-mode): 2.1 cm LEFT ATRIUM             Index        RIGHT ATRIUM          Index LA diam:        2.65 cm 1.39 cm/m   RA Area:     9.73 cm LA Vol (A2C):   42.9 ml 22.43 ml/m  RA Volume:   18.50 ml 9.67 ml/m LA Vol (A4C):   31.8 ml 16.62 ml/m LA Biplane Vol: 39.7 ml 20.75 ml/m  AORTIC VALVE LVOT Vmax:   95.40 cm/s LVOT Vmean:  61.300 cm/s LVOT VTI:    0.187 m  AORTA Ao Root diam: 3.45 cm MITRAL VALVE MV Area (PHT): 3.37 cm    SHUNTS MV Decel Time: 225 msec    Systemic VTI:  0.19 m MV E velocity: 72.30 cm/s  Systemic Diam: 2.06  cm MV A velocity: 72.80 cm/s MV E/A ratio:  0.99 Darryle Decent MD Electronically signed by Darryle Decent MD Signature Date/Time: 12/11/2024/12:55:30 PM    Final    CT CHEST WO CONTRAST Result Date: 12/03/2024 CLINICAL DATA:  Lung nodule. EXAM: CT CHEST WITHOUT CONTRAST TECHNIQUE: Multidetector CT imaging of the chest was performed following the standard protocol without IV contrast. RADIATION DOSE REDUCTION: This exam was performed according to the departmental dose-optimization program which includes automated exposure control, adjustment of the mA and/or kV according to patient size and/or use of iterative reconstruction technique. COMPARISON:  08/16/2024, 03/27/2024 and 02/16/2023.  PET 04/26/2024. FINDINGS: Cardiovascular: Enlarged pulmonic trunk. Heart size normal. No pericardial effusion. Mediastinum/Nodes: Thoracic inlet lymph nodes are not enlarged by CT size criteria. Mediastinal lymph nodes measure up to 10 mm in the low right paratracheal station, unchanged from 02/16/2023. Hilar regions are difficult to definitively evaluate without IV contrast. No axillary adenopathy. Air in the esophagus can be seen with dysmotility. Lungs/Pleura: Centrilobular and paraseptal emphysema. Tiny juxtapleural nodules are considered benign. Calcified granulomas. Scattered mucoid impaction. 5 mm  posterolateral right lower lobe nodule (3 x 7 mm, 5/85) appears stable from 08/08/2024 and slightly decreased in size from 04/26/2024, at which time it measured 9 x 13 mm. A few additional scattered pulmonary nodules measure up to 5 mm in the medial left upper lobe (5/38), present dating back to at least 02/16/2023. Scattered peribronchovascular ground-glass in the peripheral left lower lobe, unchanged. There is respiratory motion, degrading image quality in this area. No new pulmonary nodules. No pleural fluid. Airway is unremarkable. Upper Abdomen: Liver is slightly decreased in attenuation diffusely. Gallstones. Left periaortic lymph nodes measure up to 10 mm, chronically stable. Small bowel mesenteric lymph nodes measure up to 9 mm (2/163). Visualized portions of the liver, gallbladder, adrenal glands, kidneys, spleen, pancreas, stomach and bowel are otherwise grossly unremarkable. No upper abdominal adenopathy. Musculoskeletal: Degenerative changes in the spine. IMPRESSION: 1. 5 mm posterolateral right lower lobe nodule is stable from 08/08/2024 but has decreased slightly in size from 04/26/2024, at which time it was shown to be hypermetabolic. Recommend continued attention on follow-up. 2. Peribronchovascular patchy ground-glass in the left lower lobe, somewhat distorted by respiratory motion, but grossly similar. Findings may be postinfectious or postinflammatory in etiology. Recommend attention on follow-up. 3. Liver appears steatotic. 4. Cholelithiasis. 5. Enlarged pulmonic trunk, indicative of pulmonary arterial hypertension. 6.  Emphysema (ICD10-J43.9). Electronically Signed   By: Newell Eke M.D.   On: 12/03/2024 15:50     Past medical hx Past Medical History:  Diagnosis Date   Anxiety    C. 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 In families with MSH6 mutations, screening can start at age 12 years since the onset of colon cancer is later in these families.  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 He started smoking about 39 years ago. He has a 39 pack-year smoking history. He has never used smokeless tobacco. He reports that he does not drink alcohol and does not use drugs.  Tobacco Cessation: Ready to quit: Not Answered Counseling given: Not Answered Tobacco comments: 1 pack a day 12/29/2023 KRD Current every day smoker 1 PPD Counseled x 10 minutes to quit, resources provided  Past surgical hx, Family hx, Social hx all reviewed.  Current Outpatient Medications on File Prior to Visit  Medication Sig   Accu-Chek Softclix Lancets lancets Use as instructed   acetaminophen  (TYLENOL ) 500 MG tablet Take 1,000 mg by mouth every 6 (six) hours as needed for fever or mild pain.   albuterol  (PROVENTIL ) (2.5 MG/3ML) 0.083% nebulizer solution Take 3 mLs (2.5 mg total) by nebulization every 6 (six) hours as needed for wheezing or shortness of breath.   budesonide -glycopyrrolate -formoterol  (BREZTRI  AEROSPHERE) 160-9-4.8 MCG/ACT AERO inhaler Inhale 2 puffs into the lungs in the morning and at bedtime.   glucose blood (ACCU-CHEK GUIDE) test  strip Use to check blood sugar up to 4 times daily   metoprolol  tartrate (LOPRESSOR ) 50 MG tablet Take 1 tablet (50 mg total) by mouth once. Take 90-120 minutes prior to scan. Hold for SBP less than 110.   mometasone -formoterol  (DULERA ) 100-5 MCG/ACT AERO Inhale 2 puffs into the lungs 2 (two) times daily.   sertraline  (ZOLOFT ) 100 MG tablet TAKE 1 TABLET BY MOUTH EVERY DAY   VENTOLIN  HFA 108 (90 Base) MCG/ACT inhaler TAKE 2 PUFFS BY MOUTH EVERY 6 HOURS AS NEEDED FOR WHEEZE OR SHORTNESS OF BREATH   Current Facility-Administered Medications on File Prior to Visit  Medication   sodium chloride  flush (NS) 0.9 % injection 10 mL     Allergies[2]  Review Of Systems:  Constitutional:   No  weight loss, night sweats,  Fevers, chills, fatigue, or  lassitude.  HEENT:   No headaches,  Difficulty swallowing,  Tooth/dental problems, or  Sore throat,                No sneezing, itching, ear ache, nasal congestion, post nasal drip,   CV:  No chest pain,  Orthopnea, PND, swelling in lower extremities, anasarca, dizziness, palpitations, syncope.   GI  No heartburn, indigestion, abdominal pain, nausea, vomiting, diarrhea, change in bowel habits, loss of appetite, bloody stools.   Resp: ++++ shortness of breath with exertion less at rest.  No excess mucus, no productive cough,  No non-productive cough,  No coughing up of blood.  No change in color of mucus.  No wheezing.  No chest wall deformity  Skin: no rash or lesions.  GU: no dysuria, change in color of urine, no urgency or frequency.  No flank pain, no hematuria   MS:  No joint pain or swelling.  No decreased range of motion.  No back pain.  Psych:  No change in mood or affect. No depression or anxiety.  No memory loss.   Vital Signs BP 104/69   Pulse 72   Temp 97.9 F (36.6 C) (Oral)   Ht 5' 11 (1.803 m)   Wt 157 lb (71.2 kg)   SpO2 96%   BMI 21.90 kg/m    Physical Exam:  General- No  distress,  A&Ox3, pleasant ENT: No sinus  tenderness, TM clear, pale nasal mucosa, no oral exudate,no post nasal drip, no LAN Cardiac: S1, S2, regular rate and rhythm, no murmur Chest: No wheeze/ rales/ dullness; no accessory muscle use, no nasal flaring, no sternal retractions, BS clear, but distant Abd.: Soft Non-tender, ND, BS +, Body mass index is 21.9 kg/m.  Ext: No clubbing cyanosis, edema, no obvious deformities Neuro:  normal strength, MAE x 4. A&O x 3, appropriate Skin: No rashes, warm and dry, no obvious skin lesions  Psych: normal mood and behavior  Physical Exam    Assessment/Plan Severe obstructive disease with air trapping and moderately reduced DLCO consistent with emphysema Borderline significant BD response Current every day smoker  Prefers nebulized maintenance over inhalers Pending results of O&O Cardiology work up in process for [facility], pending Coronary Calcium  Scoring Scan  Plan We have reviewed your Pulmonary Function Tests You have severe COPD.  You do have a good response to broncho dilator, but you said the inhaler( Breztri ) does not help your shortness of breath. You feel you have better response to nebulizer treatments. I have ordered a nebulizer machine. You should get a call about this to get your machine I have ordered Brovana  as a nebulized treatment.  Use 2 cc's , 2 times a day . I have also ordered Pulmicort  . Again, nebulize this twice daily, 2 cc's . Do not mix Brovana  and Pulmicort  together in the same nebulizer treatment. Remember to take these separately.  Stop Breztri , as this is duplicate therapy.  Use albuterol  nebs or inhaler up to 4 times a day as needed for breakthrough shortness of breath or wheezing. We will let you know when your overnight oximetry has been read.  If you qualify for oxygen, we will place an order.  Follow up with Cardiology after your cardiac CT scan has been done. They will evaluate for Pulmonary Hypertension, which may be contributing to your shortness of  breath.  3 month follow up to see how you are doing and to review CT chest with me . This will be due 02/2025.  Please work on quitting smoking This is very important to decrease your risk of lung cancer and to prevent worsening of your breathing.  Smoking Cessation  Tips to Quit:  Pick a Quit Day within the next week.  Remove temptations: toss cigarettes, lighters, ashtrays.  Tell someone you trust for support.  Avoid triggers like stress, boredom, or being around smokers.  Use healthy replacements: water, gum, walking, deep breaths.  Stay busy with hobbies, music, drawing, or exercise.  Be patient with yourself--slipping once doesnt mean failure.    Smoking/Nicotine  Cessation Resources  If youre ready to quit TODAY, our virtual care team is available to start your journey to a tobacco free life. Appointments are available from 8 a.m. to 8 p.m. Monday to Friday. Most health insurance plans will cover some level of tobacco cessation visits and medications.  To make a virtual appointment and access other resources, follow the link: severties.nl   [employer] (1-800-QUITNOW) [employer] offers free combination nicotine  replacement therapy (patches plus either gum or lozenges). When used along with counseling, this will more than double your chances of quitting successfully.  You can receive free nicotine  replacement therapy (patches, gum, or mints) by calling 1-800-QUIT NOW or Text: Ready to 34191.  http://carroll-castaneda.info/  The American Lung Association offers Freedom From Smoking Programs Self guided or group programs offered, check their website for free  virtual programs available to [facility] residents Lung Helpline at 1-800-LUNGUSA  http://keith.biz/  [employer] Offers tools and tips to quit smoking.  Free quitSTART app:   Monitor progress, manage cravings, access tools, and more with the app.   portablegrid.se  Hypnosis for smoking cessation  [employer] [phone]  Acupuncture for smoking cessation  [employer] [facility] [phone]    Please let us  know how we can support you as you quit smoking. I know this is hard, but you can do this!    I spent 30 minutes dedicated to the care of this patient on the date of this encounter to include pre-visit review of records, face-to-face time with the patient discussing conditions above, post visit ordering of testing, clinical documentation with the electronic health record, making appropriate referrals as documented, and communicating necessary information to the patient's healthcare team.  Assessment & Plan        [name], NP 12/28/2024  10:27 AM             [1]  Social History Tobacco Use   Smoking status: Every Day    Current packs/day: 1.00    Average packs/day: 1 pack/day for 39.0 years (39.0 ttl pk-yrs)    Types: Cigarettes    Start date: 12/20/1985   Smokeless tobacco: Never   Tobacco comments:    1 pack a day 12/29/2023 KRD  Vaping Use   Vaping status: Never Used  Substance Use Topics   Alcohol use: No   Drug use: No    Comment: patient denies  [2]  Allergies Allergen Reactions   Hydroxyzine  Other (See Comments)    Made the patient feel not like himself   "

## 2024-12-28 NOTE — Telephone Encounter (Signed)
 Please advise brovana  not covered

## 2024-12-28 NOTE — Patient Instructions (Addendum)
 It is good to see you today. 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  You do have a good response to broncho dilator, but you said the inhaler( Breztri ) does not help your shortness of breath. You feel you have better response to nebulizer treatments. I have ordered a nebulizer machine. You should get a call about this to get your machine I have ordered Brovana  as a nebulized treatment.  Use 2 cc's , 2 times a day . I have also ordered Pulmicort  . Again, nebulize this twice daily, 2 cc's . Do not mix Brovana  and Pulmicort  together in the same nebulizer treatment. Remember to take these separately.  Stop Breztri , as this is duplicate therapy.  Use albuterol  nebs or inhaler up to 4 times a day as needed for breakthrough shortness of breath or wheezing. We will let you know when your overnight oximetry has been read.  If you qualify for oxygen, we will place an order.  Follow up with Cardiology after your cardiac CT scan has been done. They will evaluate for Pulmonary Hypertension, which may be contributing to your shortness of breath.  3 month follow up to see how you are doing and to review CT chest with me . This will be due 02/2025.  Please work on quitting smoking This is very important to decrease your risk of lung cancer and to prevent worsening of your breathing.  Smoking Cessation  Tips to Quit:  Pick a Quit Day within the next week.  Remove temptations: toss cigarettes, lighters, ashtrays.  Tell someone you trust for support.  Avoid triggers like stress, boredom, or being around smokers.  Use healthy replacements: water, gum, walking, deep breaths.  Stay busy with hobbies, music, drawing, or exercise.  Be patient with yourself--slipping once doesnt mean failure.   [facility] Smoking/Nicotine  Cessation Resources  If youre ready to quit TODAY, our virtual care team is available to start your journey to a tobacco free life. Appointments are  available from 8 a.m. to 8 p.m. Monday to Friday. Most health insurance plans will cover some level of tobacco cessation visits and medications.  To make a virtual appointment and access other resources, follow the link: severties.nl   [employer] (1-800-QUITNOW) [employer] offers free combination nicotine  replacement therapy (patches plus either gum or lozenges). When used along with counseling, this will more than double your chances of quitting successfully.  You can receive free nicotine  replacement therapy (patches, gum, or mints) by calling 1-800-QUIT NOW or Text: Ready to 34191.  http://carroll-castaneda.info/  The American Lung Association offers Freedom From Smoking Programs Self guided or group programs offered, check their website for free virtual programs available to [facility] residents Lung Helpline at 1-800-LUNGUSA  http://keith.biz/  [employer] Offers tools and tips to quit smoking.  Free quitSTART app:   Monitor progress, manage cravings, access tools, and more with the app.  portablegrid.se  Hypnosis for smoking cessation  [employer] 979-429-2138  Acupuncture for smoking cessation  [employer] [facility] [phone]    Please let us  know how we can support you as you quit smoking. I know this is hard, but you can do this!

## 2024-12-31 ENCOUNTER — Telehealth (HOSPITAL_COMMUNITY): Payer: Self-pay | Admitting: *Deleted

## 2024-12-31 NOTE — Telephone Encounter (Signed)
 Reaching out to patient to offer assistance regarding upcoming cardiac imaging study; pt verbalizes understanding of appt date/time, parking situation and where to check in, pre-test NPO status and medications ordered, and verified current allergies; name and call back number provided for further questions should they arise Sid Seats RN Navigator Cardiac Imaging [facility] [phone] office [phone] cell

## 2025-01-01 ENCOUNTER — Ambulatory Visit (HOSPITAL_COMMUNITY)
Admission: RE | Admit: 2025-01-01 | Discharge: 2025-01-01 | Disposition: A | Discharge status: Home / Self Care | Source: Ambulatory Visit | Attending: Cardiology | Admitting: Cardiology

## 2025-01-01 DIAGNOSIS — R079 Chest pain, unspecified: Secondary | ICD-10-CM | POA: Diagnosis not present

## 2025-01-01 DIAGNOSIS — R0609 Other forms of dyspnea: Secondary | ICD-10-CM | POA: Diagnosis not present

## 2025-01-01 MED ORDER — NITROGLYCERIN 0.4 MG SL SUBL
0.8000 mg | SUBLINGUAL_TABLET | Freq: Once | SUBLINGUAL | Status: AC
  Administered 2025-01-01: 0.8 mg via SUBLINGUAL

## 2025-01-01 MED ORDER — IOHEXOL 350 MG/ML SOLN
100.0000 mL | Freq: Once | INTRAVENOUS | Status: AC | PRN
  Administered 2025-01-01: 100 mL via INTRAVENOUS

## 2025-01-07 NOTE — Telephone Encounter (Signed)
 Do we have this patients ONO

## 2025-01-17 ENCOUNTER — Other Ambulatory Visit: Payer: Self-pay | Admitting: Acute Care

## 2025-01-17 DIAGNOSIS — J449 Chronic obstructive pulmonary disease, unspecified: Secondary | ICD-10-CM

## 2025-01-17 MED ORDER — ARFORMOTEROL TARTRATE 15 MCG/2ML IN NEBU: 15.0000 ug | INHALATION_SOLUTION | Freq: Two times a day (BID) | RESPIRATORY_TRACT | 6 refills | Status: AC

## 2025-01-22 ENCOUNTER — Other Ambulatory Visit (HOSPITAL_COMMUNITY): Payer: Self-pay

## 2025-01-22 ENCOUNTER — Other Ambulatory Visit (HOSPITAL_BASED_OUTPATIENT_CLINIC_OR_DEPARTMENT_OTHER): Payer: Self-pay | Admitting: Pulmonary Disease

## 2025-01-22 ENCOUNTER — Telehealth: Payer: Self-pay

## 2025-01-22 ENCOUNTER — Encounter: Payer: Self-pay | Admitting: Hematology

## 2025-01-22 NOTE — Telephone Encounter (Signed)
 Your request has been approved Request Reference Number: [id number]. AR[employer]EROL  NEB 15/2ML is approved through 01/22/2026. For further questions, call [employer] at [phone]. Authorization Expiration02/02/2026

## 2025-01-24 ENCOUNTER — Ambulatory Visit: Payer: Self-pay

## 2025-01-24 ENCOUNTER — Emergency Department (HOSPITAL_COMMUNITY)

## 2025-01-24 ENCOUNTER — Emergency Department (HOSPITAL_COMMUNITY)
Admission: EM | Admit: 2025-01-24 | Discharge: 2025-01-24 | Discharge status: Against Medical Advice | Attending: Emergency Medicine | Admitting: Emergency Medicine

## 2025-01-24 ENCOUNTER — Encounter (HOSPITAL_COMMUNITY): Payer: Self-pay

## 2025-01-24 DIAGNOSIS — Z5321 Procedure and treatment not carried out due to patient leaving prior to being seen by health care provider: Secondary | ICD-10-CM | POA: Insufficient documentation

## 2025-01-24 DIAGNOSIS — J069 Acute upper respiratory infection, unspecified: Secondary | ICD-10-CM | POA: Insufficient documentation

## 2025-01-24 LAB — CBC
HCT: 47.5 % (ref 39.0–52.0)
Hemoglobin: 15.6 g/dL (ref 13.0–17.0)
MCH: 28.6 pg (ref 26.0–34.0)
MCHC: 32.8 g/dL (ref 30.0–36.0)
MCV: 87.2 fL (ref 80.0–100.0)
Platelets: 358 10*3/uL (ref 150–400)
RBC: 5.45 MIL/uL (ref 4.22–5.81)
RDW: 13.9 % (ref 11.5–15.5)
WBC: 13.3 10*3/uL — ABNORMAL HIGH (ref 4.0–10.5)
nRBC: 0 % (ref 0.0–0.2)

## 2025-01-24 LAB — COMPREHENSIVE METABOLIC PANEL WITH GFR
ALT: 24 U/L (ref 0–44)
AST: 31 U/L (ref 15–41)
Albumin: 2.8 g/dL — ABNORMAL LOW (ref 3.5–5.0)
Alkaline Phosphatase: 91 U/L (ref 38–126)
Anion gap: 9 (ref 5–15)
BUN: 10 mg/dL (ref 6–20)
CO2: 24 mmol/L (ref 22–32)
Calcium: 9.9 mg/dL (ref 8.9–10.3)
Chloride: 98 mmol/L (ref 98–111)
Creatinine, Ser: 0.85 mg/dL (ref 0.61–1.24)
GFR, Estimated: >60 mL/min
Glucose, Bld: 189 mg/dL — ABNORMAL HIGH (ref 70–99)
Potassium: 5.3 mmol/L — ABNORMAL HIGH (ref 3.5–5.1)
Sodium: 131 mmol/L — ABNORMAL LOW (ref 135–145)
Total Bilirubin: 0.4 mg/dL (ref 0.0–1.2)
Total Protein: 6.8 g/dL (ref 6.5–8.1)

## 2025-01-24 LAB — SARS CORONAVIRUS 2 BY RT PCR: SARS Coronavirus 2 by RT PCR: NEGATIVE

## 2025-01-24 MED ORDER — ALBUTEROL SULFATE (2.5 MG/3ML) 0.083% IN NEBU
5.0000 mg | INHALATION_SOLUTION | Freq: Once | RESPIRATORY_TRACT | Status: AC
  Administered 2025-01-24: 5 mg via RESPIRATORY_TRACT
  Filled 2025-01-24: qty 6

## 2025-01-24 MED ORDER — METHYLPREDNISOLONE SODIUM SUCC 125 MG IJ SOLR
125.0000 mg | Freq: Once | INTRAMUSCULAR | Status: AC
  Administered 2025-01-24: 125 mg via INTRAVENOUS
  Filled 2025-01-24: qty 2

## 2025-01-24 NOTE — ED Notes (Signed)
Pt IV removed

## 2025-01-24 NOTE — ED Provider Triage Note (Signed)
 Emergency Medicine Provider Triage Evaluation Note  [name] , a 54 y.o. male  was evaluated in triage.  Pt complains of flu and increased dyspnea.  Patient states he is not normally on oxygen but was very short of breath and unable to walk to the bathroom today.  He was placed on 4 L oxygen prehospital.  He reports coughing up green mucus and his son having the flu last week.  He began having symptoms about a week ago and has not been tested.  He is feeling progressively worse..  Review of Systems  Positive: Fever Negative: Nausea vomiting diarrhea  Physical Exam  BP 111/83   Pulse 100   Temp 98.2 F (36.8 C) (Oral)   Resp (!) 24   SpO2 95%  Gen:   Awake, no distress   Resp:  Normal effort patient with some rhonchi, decreased breath sounds and some expiratory wheezes MSK:   Moves extremities without difficulty  Other:  Ostomy in place  Medical Decision Making  Medically screening exam initiated at 12:01 PM.  Appropriate orders placed.  [name] was informed that the remainder of the evaluation will be completed by another provider, this initial triage assessment does not replace that evaluation, and the importance of remaining in the ED until their evaluation is complete.  Will order labs, imaging, treatment of Solu-Medrol , albuterol , flu COVID RSV testing   Tyrone Houston, MD 01/24/25 1208

## 2025-01-24 NOTE — ED Notes (Signed)
 Pt left AMA

## 2025-01-24 NOTE — ED Triage Notes (Signed)
 Patient bib [facility] with flu-like symptoms. He is having shob and is on 4L of oxygen. He has been coughing up green mucous for the past 6 days. He has COPD. He is progressively feeling worse and not better. Son had the flu last week.

## 2025-01-25 ENCOUNTER — Ambulatory Visit

## 2025-01-25 VITALS — BP 110/71 | HR 85 | Ht 72.0 in | Wt 152.0 lb

## 2025-01-25 DIAGNOSIS — J441 Chronic obstructive pulmonary disease with (acute) exacerbation: Secondary | ICD-10-CM

## 2025-01-25 DIAGNOSIS — F419 Anxiety disorder, unspecified: Secondary | ICD-10-CM

## 2025-01-25 MED ORDER — PREDNISONE 50 MG PO TABS: 50.0000 mg | ORAL_TABLET | Freq: Every day | ORAL | 0 refills | Status: AC

## 2025-01-25 MED ORDER — SERTRALINE HCL 100 MG PO TABS: 200.0000 mg | ORAL_TABLET | Freq: Every day | ORAL | 1 refills | Status: AC

## 2025-01-25 MED ORDER — AZITHROMYCIN 500 MG PO TABS: 500.0000 mg | ORAL_TABLET | Freq: Every day | ORAL | 0 refills | Status: AC

## 2025-01-25 MED ORDER — ALBUTEROL SULFATE (2.5 MG/3ML) 0.083% IN NEBU
2.5000 mg | INHALATION_SOLUTION | Freq: Once | RESPIRATORY_TRACT | Status: AC
  Administered 2025-01-25: 2.5 mg via RESPIRATORY_TRACT

## 2025-01-25 MED ORDER — IPRATROPIUM BROMIDE 0.02 % IN SOLN
0.5000 mg | Freq: Once | RESPIRATORY_TRACT | Status: AC
  Administered 2025-01-25: 0.5 mg via RESPIRATORY_TRACT

## 2025-01-25 NOTE — Progress Notes (Signed)
" ° ° °  SUBJECTIVE:   CHIEF COMPLAINT / HPI:   [name] is a 54 y.o. male  presenting for SOB. PMHx significant for severe COPD not currently on home O2.   Symptoms started Sunday night with fever and chills. He continues to have increased work of breathing. Has been using his home inhalers without significant relief.  He went to the ER yesterday for this but left after triage due to the long wait. He received IV prednisone  there, which has helped somewhat, but he still gets very short of breath and feels very hot and panicky with minimal exertion such as walking to the bathroom. He is not on home oxygen.  Sick contacts: son tested positive for the flu last week  Signs of respiratory distress: no Appetite: normal  Hydration: normal    PERTINENT  PMH / PSH: Reviewed and updated   OBJECTIVE:   BP 110/71   Pulse 85   Ht 6' (1.829 m)   Wt 152 lb (68.9 kg)   SpO2 93%   BMI 20.61 kg/m   SIck-appearing, no acute distress HEENT: erythematous nasal turbinates, clear TMs bilaterally, mild cervical lymphadenopathy bilaterally. Mild maxillary sinus tenderness Cardio: Regular rate, regular rhythm, no murmurs on exam. <2 sec capillary refill  Pulm: Diffuse expiratory wheezing and diminished breath sounds.  No increased work of breathing Abdominal: bowel sounds present, soft, non-tender, non-distended  ASSESSMENT/PLAN:   Assessment & Plan COPD exacerbation (HCC) Stable appearing. Discussed ED precautions. Presentation consistent with COPD exacerbation with sputum production.  Received IV steroid yesterday in the ED, will send additional 4 day course fo 50 mg prednisone  Prescribe 3 day course azithromycin  500 mg daily  Follow up in 1-2 weeks as needed  Anxiety Refilled zoloft . Per patient he has struggled with getting enough medication sent to his pharmacy. He is currently on 200 mg daily dose. Will send in prescription for 180 pills for 90 day supply. Follow up as needed with PCP       Damien Pinal, DO  [facility]  "

## 2025-01-25 NOTE — Assessment & Plan Note (Signed)
 Refilled zoloft . Per patient he has struggled with getting enough medication sent to his pharmacy. He is currently on 200 mg daily dose. Will send in prescription for 180 pills for 90 day supply. Follow up as needed with PCP

## 2025-01-25 NOTE — Patient Instructions (Signed)
 It was great to see you today!   I have prescribed a 3 day course of antibiotics called Azithromycin  and a 4 day course of prednisone .   I have refilled the zoloft , you should now have a 90 day supply.   No future appointments.  Please arrive 15 minutes before your appointment to ensure smooth check in process.  If you are more than 15 minutes late, you may be asked to reschedule.   Please bring a list of your medications with you to all appointments.   Please call the clinic at [phone] if your symptoms worsen or you have any concerns.  Thank you for allowing me to participate in your care, Dr. Damien Pinal [facility]

## 2025-01-25 NOTE — Telephone Encounter (Signed)
 Prior auth submitted and approved.NFN

## 2025-02-11 ENCOUNTER — Ambulatory Visit: Admitting: Student
